# Patient Record
Sex: Female | Born: 1940
Health system: Southern US, Community
[De-identification: ages and names within clinical notes are randomized; demographics above are authoritative.]

## PROBLEM LIST (undated history)

## (undated) DIAGNOSIS — Z8489 Family history of other specified conditions: Secondary | ICD-10-CM

## (undated) DIAGNOSIS — Z9289 Personal history of other medical treatment: Secondary | ICD-10-CM

## (undated) DIAGNOSIS — F419 Anxiety disorder, unspecified: Secondary | ICD-10-CM

## (undated) DIAGNOSIS — M549 Dorsalgia, unspecified: Secondary | ICD-10-CM

## (undated) DIAGNOSIS — M1990 Unspecified osteoarthritis, unspecified site: Secondary | ICD-10-CM

## (undated) DIAGNOSIS — M2540 Effusion, unspecified joint: Secondary | ICD-10-CM

## (undated) DIAGNOSIS — R600 Localized edema: Secondary | ICD-10-CM

## (undated) DIAGNOSIS — E785 Hyperlipidemia, unspecified: Secondary | ICD-10-CM

## (undated) DIAGNOSIS — I10 Essential (primary) hypertension: Secondary | ICD-10-CM

## (undated) DIAGNOSIS — I2699 Other pulmonary embolism without acute cor pulmonale: Secondary | ICD-10-CM

## (undated) DIAGNOSIS — R0602 Shortness of breath: Secondary | ICD-10-CM

## (undated) DIAGNOSIS — Z860100 Personal history of colon polyps, unspecified: Secondary | ICD-10-CM

## (undated) DIAGNOSIS — E039 Hypothyroidism, unspecified: Secondary | ICD-10-CM

## (undated) DIAGNOSIS — C801 Malignant (primary) neoplasm, unspecified: Secondary | ICD-10-CM

## (undated) DIAGNOSIS — R350 Frequency of micturition: Secondary | ICD-10-CM

## (undated) DIAGNOSIS — R609 Edema, unspecified: Secondary | ICD-10-CM

## (undated) DIAGNOSIS — Z9989 Dependence on other enabling machines and devices: Secondary | ICD-10-CM

## (undated) DIAGNOSIS — R062 Wheezing: Secondary | ICD-10-CM

## (undated) DIAGNOSIS — K559 Vascular disorder of intestine, unspecified: Secondary | ICD-10-CM

## (undated) DIAGNOSIS — G4733 Obstructive sleep apnea (adult) (pediatric): Secondary | ICD-10-CM

## (undated) DIAGNOSIS — G8929 Other chronic pain: Secondary | ICD-10-CM

## (undated) DIAGNOSIS — H269 Unspecified cataract: Secondary | ICD-10-CM

## (undated) DIAGNOSIS — R531 Weakness: Secondary | ICD-10-CM

## (undated) DIAGNOSIS — Z8709 Personal history of other diseases of the respiratory system: Secondary | ICD-10-CM

## (undated) DIAGNOSIS — M2550 Pain in unspecified joint: Secondary | ICD-10-CM

## (undated) DIAGNOSIS — D869 Sarcoidosis, unspecified: Secondary | ICD-10-CM

## (undated) DIAGNOSIS — J189 Pneumonia, unspecified organism: Secondary | ICD-10-CM

## (undated) HISTORY — PX: APPENDECTOMY: SHX54

## (undated) HISTORY — PX: MELANOMA EXCISION: SHX5266

## (undated) HISTORY — PX: BREAST BIOPSY: SHX20

## (undated) HISTORY — PX: BREAST CYST ASPIRATION: SHX578

## (undated) HISTORY — DX: Shortness of breath: R06.02

## (undated) HISTORY — PX: ABDOMINAL HYSTERECTOMY: SHX81

## (undated) HISTORY — DX: Obstructive sleep apnea (adult) (pediatric): G47.33

## (undated) HISTORY — PX: COLONOSCOPY: SHX174

## (undated) HISTORY — PX: LIVER BIOPSY: SHX301

## (undated) HISTORY — DX: Hyperlipidemia, unspecified: E78.5

## (undated) HISTORY — PX: DILATION AND CURETTAGE OF UTERUS: SHX78

## (undated) HISTORY — DX: Dependence on other enabling machines and devices: Z99.89

## (undated) HISTORY — PX: THYROID SURGERY: SHX805

---

## 1970-06-09 HISTORY — PX: OTHER SURGICAL HISTORY: SHX169

## 1990-06-09 DIAGNOSIS — J189 Pneumonia, unspecified organism: Secondary | ICD-10-CM

## 1990-06-09 HISTORY — DX: Pneumonia, unspecified organism: J18.9

## 1998-07-26 ENCOUNTER — Other Ambulatory Visit: Admission: RE | Admit: 1998-07-26 | Discharge: 1998-07-26 | Payer: Self-pay | Admitting: Obstetrics and Gynecology

## 1998-11-30 ENCOUNTER — Ambulatory Visit (HOSPITAL_COMMUNITY): Admission: RE | Admit: 1998-11-30 | Discharge: 1998-11-30 | Payer: Self-pay | Admitting: Surgery

## 1998-11-30 ENCOUNTER — Encounter: Payer: Self-pay | Admitting: Surgery

## 1999-10-29 ENCOUNTER — Encounter: Payer: Self-pay | Admitting: Surgery

## 1999-10-29 ENCOUNTER — Encounter: Admission: RE | Admit: 1999-10-29 | Discharge: 1999-10-29 | Payer: Self-pay

## 1999-11-11 ENCOUNTER — Other Ambulatory Visit: Admission: RE | Admit: 1999-11-11 | Discharge: 1999-11-11 | Payer: Self-pay | Admitting: Obstetrics and Gynecology

## 2001-02-05 ENCOUNTER — Encounter: Admission: RE | Admit: 2001-02-05 | Discharge: 2001-02-05 | Payer: Self-pay | Admitting: Surgery

## 2001-02-05 ENCOUNTER — Encounter: Payer: Self-pay | Admitting: Surgery

## 2002-04-11 ENCOUNTER — Encounter (HOSPITAL_BASED_OUTPATIENT_CLINIC_OR_DEPARTMENT_OTHER): Payer: Self-pay | Admitting: Internal Medicine

## 2002-04-11 ENCOUNTER — Ambulatory Visit (HOSPITAL_COMMUNITY): Admission: RE | Admit: 2002-04-11 | Discharge: 2002-04-11 | Payer: Self-pay | Admitting: Internal Medicine

## 2003-08-22 ENCOUNTER — Emergency Department (HOSPITAL_COMMUNITY): Admission: EM | Admit: 2003-08-22 | Discharge: 2003-08-22 | Payer: Self-pay | Admitting: Emergency Medicine

## 2007-05-24 ENCOUNTER — Encounter (HOSPITAL_BASED_OUTPATIENT_CLINIC_OR_DEPARTMENT_OTHER): Payer: Self-pay | Admitting: Internal Medicine

## 2007-05-24 ENCOUNTER — Ambulatory Visit: Payer: Self-pay | Admitting: *Deleted

## 2007-05-24 ENCOUNTER — Inpatient Hospital Stay (HOSPITAL_COMMUNITY): Admission: EM | Admit: 2007-05-24 | Discharge: 2007-06-04 | Payer: Self-pay | Admitting: Internal Medicine

## 2007-06-02 ENCOUNTER — Encounter (HOSPITAL_BASED_OUTPATIENT_CLINIC_OR_DEPARTMENT_OTHER): Payer: Self-pay | Admitting: Internal Medicine

## 2007-09-02 ENCOUNTER — Observation Stay (HOSPITAL_COMMUNITY): Admission: EM | Admit: 2007-09-02 | Discharge: 2007-09-03 | Payer: Self-pay | Admitting: Emergency Medicine

## 2007-09-03 ENCOUNTER — Encounter (INDEPENDENT_AMBULATORY_CARE_PROVIDER_SITE_OTHER): Payer: Self-pay | Admitting: *Deleted

## 2008-09-07 ENCOUNTER — Inpatient Hospital Stay (HOSPITAL_COMMUNITY): Admission: AD | Admit: 2008-09-07 | Discharge: 2008-09-09 | Payer: Self-pay | Admitting: Internal Medicine

## 2010-05-05 ENCOUNTER — Ambulatory Visit: Payer: Self-pay | Admitting: Diagnostic Radiology

## 2010-05-05 ENCOUNTER — Emergency Department (HOSPITAL_BASED_OUTPATIENT_CLINIC_OR_DEPARTMENT_OTHER): Admission: EM | Admit: 2010-05-05 | Discharge: 2010-05-05 | Payer: Self-pay | Admitting: Emergency Medicine

## 2010-05-29 ENCOUNTER — Ambulatory Visit (HOSPITAL_COMMUNITY)
Admission: RE | Admit: 2010-05-29 | Discharge: 2010-05-29 | Payer: Self-pay | Source: Home / Self Care | Attending: Internal Medicine | Admitting: Internal Medicine

## 2010-06-12 ENCOUNTER — Other Ambulatory Visit
Admission: RE | Admit: 2010-06-12 | Discharge: 2010-06-12 | Payer: Self-pay | Source: Home / Self Care | Admitting: Interventional Radiology

## 2010-06-12 ENCOUNTER — Encounter
Admission: RE | Admit: 2010-06-12 | Discharge: 2010-06-12 | Payer: Self-pay | Source: Home / Self Care | Attending: Internal Medicine | Admitting: Internal Medicine

## 2010-08-21 LAB — DIFFERENTIAL
Basophils Absolute: 0 10*3/uL (ref 0.0–0.1)
Basophils Relative: 0 % (ref 0–1)
Lymphocytes Relative: 26 % (ref 12–46)
Lymphs Abs: 1.8 10*3/uL (ref 0.7–4.0)
Monocytes Absolute: 0.8 10*3/uL (ref 0.1–1.0)
Neutrophils Relative %: 62 % (ref 43–77)

## 2010-08-21 LAB — CBC
HCT: 44.5 % (ref 36.0–46.0)
Hemoglobin: 14.9 g/dL (ref 12.0–15.0)
MCH: 30.5 pg (ref 26.0–34.0)
MCHC: 33.5 g/dL (ref 30.0–36.0)
MCV: 90.9 fL (ref 78.0–100.0)
Platelets: 227 10*3/uL (ref 150–400)
RDW: 12.8 % (ref 11.5–15.5)
WBC: 7.2 10*3/uL (ref 4.0–10.5)

## 2010-08-21 LAB — BASIC METABOLIC PANEL
BUN: 15 mg/dL (ref 6–23)
CO2: 28 mEq/L (ref 19–32)
Sodium: 147 mEq/L — ABNORMAL HIGH (ref 135–145)

## 2010-09-18 LAB — CBC
HCT: 37.8 % (ref 36.0–46.0)
HCT: 39.4 % (ref 36.0–46.0)
HCT: 40 % (ref 36.0–46.0)
HCT: 45.1 % (ref 36.0–46.0)
Hemoglobin: 13.2 g/dL (ref 12.0–15.0)
Hemoglobin: 13.7 g/dL (ref 12.0–15.0)
Hemoglobin: 15.3 g/dL — ABNORMAL HIGH (ref 12.0–15.0)
MCHC: 33.9 g/dL (ref 30.0–36.0)
MCHC: 34.7 g/dL (ref 30.0–36.0)
MCHC: 35.1 g/dL (ref 30.0–36.0)
MCV: 91.3 fL (ref 78.0–100.0)
MCV: 92.5 fL (ref 78.0–100.0)
MCV: 93.6 fL (ref 78.0–100.0)
Platelets: 194 10*3/uL (ref 150–400)
RBC: 4.28 MIL/uL (ref 3.87–5.11)
RBC: 4.46 MIL/uL (ref 3.87–5.11)
RDW: 13.2 % (ref 11.5–15.5)
RDW: 13.3 % (ref 11.5–15.5)
RDW: 13.4 % (ref 11.5–15.5)
RDW: 13.4 % (ref 11.5–15.5)
RDW: 13.4 % (ref 11.5–15.5)
WBC: 11.2 10*3/uL — ABNORMAL HIGH (ref 4.0–10.5)
WBC: 12.7 10*3/uL — ABNORMAL HIGH (ref 4.0–10.5)
WBC: 14.8 10*3/uL — ABNORMAL HIGH (ref 4.0–10.5)
WBC: 9.5 10*3/uL (ref 4.0–10.5)

## 2010-09-18 LAB — COMPREHENSIVE METABOLIC PANEL
ALT: 26 U/L (ref 0–35)
Albumin: 3.7 g/dL (ref 3.5–5.2)
Alkaline Phosphatase: 88 U/L (ref 39–117)
Creatinine, Ser: 0.74 mg/dL (ref 0.4–1.2)
GFR calc Af Amer: >60 mL/min (ref >60)
Glucose, Bld: 92 mg/dL (ref 70–99)
Sodium: 141 mEq/L (ref 135–145)
Total Bilirubin: 0.9 mg/dL (ref 0.3–1.2)
Total Protein: 7 g/dL (ref 6.0–8.3)

## 2010-09-18 LAB — BASIC METABOLIC PANEL
BUN: 9 mg/dL (ref 6–23)
CO2: 26 mEq/L (ref 19–32)
Calcium: 8.7 mg/dL (ref 8.4–10.5)
Calcium: 8.7 mg/dL (ref 8.4–10.5)
Creatinine, Ser: 0.75 mg/dL (ref 0.4–1.2)
Creatinine, Ser: 0.75 mg/dL (ref 0.4–1.2)
Glucose, Bld: 98 mg/dL (ref 70–99)

## 2010-09-18 LAB — HEMOGLOBIN AND HEMATOCRIT, BLOOD: Hemoglobin: 13.2 g/dL (ref 12.0–15.0)

## 2010-10-22 NOTE — H&P (Signed)
NAME:  QUETZALI, HEINLE NO.:  [id number]   MEDICAL RECORD NO.:  [id number]          PATIENT TYPE:  INP   LOCATION:  4705                         FACILITY:  [facility]   PHYSICIAN:  Gwen Pounds, MD       DATE OF BIRTH:  May 04, 1941   DATE OF ADMISSION:  09/07/2008  DATE OF DISCHARGE:                              HISTORY & PHYSICAL   PRIMARY CARE [place]ingle:  Dr. Dossie Arbour.   GASTROENTEROLOGY DOCTOR:  Dr. Everardo All. Madilyn Fireman, MD   CHIEF COMPLAINT:  Bloody bowel movements and bright red blood per  rectum.   HISTORY OF PRESENT ILLNESS:  This is a 70 year old female with history  of 8-mm of colon polyps done and colonoscopy on May 20, 2007 and  diverticulosis, who presents with 12 hours of recurrent and multiple  bloody bowel movements, bright red blood per rectum, abdominal cramps  left greater than right.  She had a history of DVT in the past and is  off Coumadin since July 2009.  She brought very bloody face cloths from  wiping and cleaning into the office today.  She has passed multiple  clots.  No unusual exposures or illness.  No fever.  No chills.  Positive nausea.  No vomiting.  No reflux.  We will admit for evaluation  and treatment.   PAST MEDICAL HISTORY:  Hypertension, hyperlipidemia, hypothyroid,  seasonal allergic rhinitis, thyroid cancer, status post thyroidectomy,  history of appendectomy, history of liver biopsy, history of benign  breast biopsy x2, atypical chest pain with a negative workup in the  past, history of left lower extremity DVT, and pulmonary emboli in  December 2008, and off Coumadin since July 2009.   ALLERGIES:  CODEINE, TENORMIN, ZOCOR, CRESTOR, CO Q10, and CELEXA.   MEDICATIONS:  1. Synthroid 112 daily.  2. Tiazac 360 daily.  3. Avapro 300 one half daily.  4. Toprol-XL one half 50 mg tablet on a daily basis.  5. Lovastatin 20 daily.  6. Ativan 1 mg to use p.r.n.  7. Vitamin D 50,000 every weekly.  8. Multivitamin 1 p.o. daily.  9. Calcium plus D daily.  10.Senior eye care vitamin.  11.Advair 100/50 b.i.d.   SOCIAL HISTORY:  She is married.  No tobacco, no alcohol.   FAMILY HISTORY:  Diabetes or breast cancer.   REVIEW OF SYSTEMS:  No fever no chills.  No night sweats.  No headache.  No chest pain.  No shortness of breath.  The patient is anxious.  She  has got bright red blood per rectum.  She has got abdominal pain.  She  has got no hematemesis.   PHYSICAL EXAMINATION:  VITAL SIGNS:  Temperature 98.5, heart rate 82,  respiratory rate 18, weight 202 pounds, and blood pressure 180/98.  GENERAL:  Alert and oriented.  She is anxious.  PULMONARY:  Clear to auscultation bilaterally.  CARDIAC:  Regular.  HEENT:  Oropharynx is dry.  ABDOMEN:  Tender in bilateral lower quadrant, left greater than right.  No rebound or guarding.  ABDOMEN:  Overall soft.  EXTREMITIES:  No clubbing.  No cyanosis.  No edema.  She has a bloody  face cloth in her bag from soaked up blood from her rectum.   Last hemoglobin 15.7 on November 2009.  I did a stat CBC with a white  count of 13.3, hemoglobin 15.5, and platelet count 247.   ASSESSMENT:  1. This is a lady being admitted with bright red blood per rectum,      lower GI bleed, presumed diverticular.  We will admit.  Place on      telemetry.  Place n.p.o.  Get a bedside commode.  Get a GI      evaluation.  Dr. Madilyn Fireman is normally her primary GI.  We will get      serial CBCs.  Protonix IV.  May get colonoscopy if it does not stop      or surgery; if severe, we will give blood pressure medications.  2. Home medications as needed.  No Lovenox or aspirin.  There is no      evidence of needing a CT or antibiotics yet, we will let GI make      the call.  The only issue is her elevated white count.      Gwen Pounds, MD  Electronically Signed     JMR/MEDQ  D:  09/07/2008  T:  09/08/2008  Job:  [id number]   cc:   Everardo All. Madilyn Fireman, M.D.  Dr. Jarold Motto

## 2010-10-22 NOTE — Consult Note (Signed)
NAME:  [name], [name] NO.:  [id number]   MEDICAL RECORD NO.:  [id number]          PATIENT TYPE:  INP   LOCATION:  1442                         FACILITY:  [facility]   PHYSICIAN:  Valetta Fuller, M.D.  DATE OF BIRTH:  December 08, 1940   DATE OF CONSULTATION:  06/02/2007  DATE OF DISCHARGE:                                 CONSULTATION   REASON FOR CONSULTATION:  Asymptomatic gross hematuria, on  anticoagulation for pulmonary embolus.   HISTORY OF PRESENT ILLNESS:  [name] is a very pleasant 70-year-  old female.  Really has no significant urologic history, other than  occasional sporadic urinary tract infections.  She was readmitted after  she had began to develop some increasing dyspnea on exertion and  shortness of breath.  Apparently during a colonoscopy she was noted to  be hypoxic.  As part of her assessment, she had a chest CT which showed  multiple pulmonary emboli.  There had not been an apparent history of  DVT.  The patient was admitted back on May 24, 2007.  She began  noticing some gross hematuria.  Initially there were a few stringy  clots.  With this she has had no flank or abdominal discomfort.  She has  really had no dysuria.  She has had no other substantial change in her  symptoms and really had asymptomatic gross hematuria.  Her INRs have  been relatively therapeutic and within the range as expected.  Her urine  subsequently has cleared somewhat, and the last void was relatively  clear.  She has no personal or family history of stone disease.  She is  a nonsmoker.   PAST MEDICAL HISTORY:  Notable for some hypertension, hyperlipidemia,  hypothyroidism.   MEDICATIONS:  At the time of admission were:  Synthroid, Cardizem,  Avapro, Toprol XL, lovastatin, vitamin D; as well as some additional  vitamins and supplements.   ALLERGIES:  She has allergies or intolerances to CODEINE, ATENOLOL,  ZOCOR and CRESTOR.   PAST SURGICAL HISTORY:  She has  had an appendectomy in the past, breast  biopsies, thyroidectomy.   SOCIAL HISTORY:  Again, she is a nonsmoker and does not consume alcohol.   FAMILY HISTORY:  Really negative for any urologic problems.   REVIEW OF SYSTEMS:  Negative for any fever or chills.  No abdominal  discomfort or flank pain.  No severe incontinence.  No dysuria.  No  current chest pain.  Shortness of breath has improved.   PHYSICAL EXAMINATION:  On exam she is a well-developed, well-nourished  female.  She is alert and oriented x3.  The patient is currently  afebrile with a blood pressure of 128/72, a pulse of 57, and a room air  saturation of 93%.  Respiratory effort is normal.  ABDOMEN:  Protuberant but soft.  No obvious palpable masses or  splenomegaly.  GU:  Vaginal exam shows a mild cystocele.  Mild vaginal atrophy.  No  obvious palpable urethral masses or any other vaginal masses.  No  evidence of vaginal bleeding.  EXTREMITIES:  Do not show any substantial tenderness or edema.   DATA:  Urinalysis was notable for large blood on dipstick.  Negative  nitrate, negative leukocyte.  Microscopic actually showed only 3-6 red  cells per high-power field.  Culture remains pending and urine cytology  was also sent, which remains pending.  Renal function studies are normal  and hemoglobin is 15.8.   PROCEDURE:  Bedside flexible cystoscopy was performed.  Urethra and  bladder were endoscopically normal, with the exception of a little bit  of what looked like some chronic inflammatory changes around her  trigone.  There appear to be a little mucosal cobblestoning, with some  submucosal edema and mild erythema.  This was more consistent with a  benign inflammatory process, but could not completely rule out the  possibility of carcinoma in situ, although I think that is unlikely.  No  obvious bladder masses.  Efflux of urine from both sides appeared to be  clear.   ASSESSMENT:  GROSS HEMATURIA ON ANTICOAGULATION.   No evidence of  bacterial cystitis.  Cystoscopy relatively unrevealing with cytology  pending.  I do think she should have an abdominal pelvic CT.  I would  propose the abdominal component be done with and without contrast to a  look for renal calculi.  We want to rule out renal mass or any other  more significant pathology.  If nothing else is noted, then I think she  should continue with her anticoagulation -- knowing that it is possible  that she could have some recurrent hematuria, and we will address that  as indicated.  We will also arrange for some urologic follow-up to be  sure that all things have checked out.           ______________________________  Valetta Fuller, M.D.  Electronically Signed     DSG/MEDQ  D:  06/02/2007  T:  06/03/2007  Job:  [id number]

## 2010-10-22 NOTE — Consult Note (Signed)
NAME:  [name], [name] NO.:  [id number]   MEDICAL RECORD NO.:  [id number]          PATIENT TYPE:  INP   LOCATION:  4706                         FACILITY:  [facility]   PHYSICIAN:  Danise Edge, M.D.   DATE OF BIRTH:  07-Dec-1940   DATE OF CONSULTATION:  09/07/2008  DATE OF DISCHARGE:                                 CONSULTATION   We were asked to see her in consultation today for GI bleeding by Dr.  Creola Corn.   HISTORY OF PRESENT ILLNESS:  This is a very pleasant 70 year old female  who tells me that she was awakened from sleep last night approximately  1:00 a.m.  She had multiple stools that eventually changed to bright red  blood with clots.  The patient has a continual urge to stools today.  Her last bright red blood per rectum was at 11:30 a.m. this morning.  She has severe lower abdominal cramping that continues even now.   Prior to this illness, the patient reports that she had similar episodes  of awakening at night with cramps and stooling but not had any blood  previously.  This would happen to her once every few months.  She denies  any indigestion, heartburn, anorexia, weight loss, or emesis.  The  patient has been on steroids intermittently for chronic lung issues that  sound like bronchitis since she had her pulmonary emboli in 2008.  Her  primary care physician is Dr. Jarold Motto and Dr. Creola Corn.  Her last  colonoscopy was in December 2008, it was done by Dr. Dorena Cookey who  found one 8-mm polyp in her descending colon and also found few sigmoid  diverticula.   PAST MEDICAL HISTORY:  Significant for:  1. PEs in 2008.  2. Hypertension.  3. High cholesterol.  4. Sarcoid in her lungs.  5. IBS.  6. Paroxysmal atrial fibrillation.  7. Open appendectomy when she was 70 years old.  8. Thyroid cancer, she is status post resection.  9. Two benign breast biopsies.  10.She has no known coronary artery disease.   She has an allergy to [facility].   CURRENT  MEDICATIONS:  Avapro, Synthroid, metoprolol, pravastatin,  vitamin D, multivitamins, and calcium.   SOCIAL HISTORY:  Negative for alcohol and tobacco.   FAMILY HISTORY:  Significant for her father who died with colon cancer.  He was 72 years old when he passed.   REVIEW OF SYSTEMS:  Significant for severe cough back in January,  intermittent abdominal cramping, occasional palpitations, and occasional  steroid use.   PHYSICAL EXAMINATION:  GENERAL:  She is alert and oriented, very  pleasant to speak with.  She is in no apparent distress.  HEART:  Regular rate and rhythm with no murmurs, rubs, or gallops  appreciated.  LUNGS:  Demonstrate no wheezing, no crackles.  She has no labored  breathing.  ABDOMEN:  Soft, nontender, and nondistended with good bowel sounds.  However, she reports her pain is in her lower quadrants bilaterally.   LABORATORY DATA:  Pending.  Her husband tells me that her hemoglobin was  normal in Dr. Ferd Hibbs office today.  ASSESSMENT:  Dr. Danise Edge has seen and examined the patient,  collected a history, and reviewed her chart.  His impression is this is  a very pleasant 70 year old female with ischemic colitis versus  diverticular bleed.  We will plan for supportive care, transfuse packed  red blood cells as needed.  We will gently cleanse her colon with  MiraLax and follow her clinically.  Thanks very much for this  consultation.      Stephani Police, PA    ______________________________  Danise Edge, M.D.    MLY/MEDQ  D:  09/07/2008  T:  09/08/2008  Job:  [id number]   cc:   Everardo All. Madilyn Fireman, M.D.  Gwen Pounds, MD

## 2010-10-22 NOTE — Discharge Summary (Signed)
NAME:  [name], [name] NO.:  [id number]   MEDICAL RECORD NO.:  [id number]          PATIENT TYPE:  INP   LOCATION:  2006                         FACILITY:  [facility]   PHYSICIAN:  Barry Dienes. [name], M.D.DATE OF BIRTH:  06-Oct-1940   DATE OF ADMISSION:  09/02/2007  DATE OF DISCHARGE:  09/03/2007                               DISCHARGE SUMMARY   PERTINENT FINDINGS:  The patient is a 70 year old Caucasian woman who  complained of chest tightness described as first occurring on August 21, 2007.  It was mid substernal in location and aching.  At that time, her  chest discomfort lasted most of the day and was not associated with  shortness of breath, nausea, diaphoresis, or radiation to the left  shoulder.  On the next day, she had a few twinges of chest discomfort.  Since that time, she has had an occasional less than 5-minute episodes  of similar chest discomfort, again not associated with shortness of  breath or diaphoresis.  She has not had any exertional chest pain.  She  has no history of coronary artery disease.   PAST MEDICAL HISTORY:  Significant for a December 2008 hospital  admission for shortness of breath with left leg discomfort.  The work up  showing massive pulmonary emboli.  Since that time, she has been on  Coumadin.  Of note, on August 20, 2007, her INR level was 1.8 and a dose  of Coumadin was adjusted to 6 mg daily with 8 mg on Fridays.  She has  had increasing anxiety lately and had just seen her counselor earlier on  the day of admission.  She has no history of coronary artery disease and  had an October 2003 Cardiolite exercise test for atypical chest pain  that showed normal left ventricular ejection fraction (68%) with no  signs of ischemia.  She also has a history of hypertension,  hyperlipidemia, hypothyroidism, and thyroid carcinoma with December 2008  thyroglobulin level normal, and right upper quadrant pain in November  2003 with CT scan of the  abdomen and pelvis not showing gallstones or  hepatic neoplasm, and 2003 HIDA scan normal.   MEDICATIONS PRIOR TO HOSPITAL ADMISSION:  1. Coumadin 8 mg on Fridays and 6 mg other days of the week.  2. Ativan 0.5 mg twice daily.  3. Synthroid 112 mcg daily.  4. Cardizem CD 360 daily.  5. Avapro 150 mg daily.  6. Toprol-XL 25 mg twice daily.  7. Zetia 10 mg daily.  8. Lovastatin 80 mg daily.  9. Vitamin D 50,000 units p.o. once weekly.  10.Multivitamin once daily.  11.Calcium 600 mg with vitamin D twice daily.  12.Fish oil capsules once daily.   INITIAL PHYSICAL EXAM:  VITAL SIGNS:  Blood pressure 160/92, pulse 80,  respirations 20, pulse oxygen saturation 97% on room air.  GENERAL:  She is a mildly overweight white female who is in no apparent  distress.  HEENT:  Within normal limits.  NECK:  Supple without jugular venous distension or carotid bruit.  CHEST:  Clear to auscultation.  HEART:  Regular rate and rhythm without significant murmur,  gallop, or  rub.  ABDOMEN:  Benign.  EXTREMITIES:  Without cyanosis, clubbing, or edema.  NEUROLOGICAL:  Nonfocal.   LABORATORY STUDIES:  EKG showed the following:  Normal sinus rhythm and  within normal limits with normalization of a flat T-wave in lead aVF  versus an EKG from May 24, 2007.  The office INR level was 2.0.   HOSPITAL COURSE:  The patient was admitted to a medical bed with  telemetry.  She was seen by a Cardiology Consultant who felt that she  has atypical chest pain that might not be from cardiac disease.  He  recommended drawing serial cardiac isoenzymes to rule out the unlikely  possibility of non-Q-wave myocardial infarction and obtaining an  echocardiogram.  Serial cardiac isoenzymes were normal with troponin I  levels 0.01 and 0.01 and CPK levels of 124 and 115.  The echocardiogram showed the following:  1. Normal left ventricular systolic function and regional wall motion.  2. Mild aortic valve sclerosis without  stenosis.  3. Mild right ventricular enlargement.   The patient did find, on the day following her admission, she was a bit  lightheaded in the morning, but was able to walk to the bathroom without  difficulty.  She had no further recurrences of chest pain.  EKG  telemetry showed that she remained in a sinus rhythm.   CONDITION ON DISCHARGE:  She has not had further chest pain and feels  fine right now.  Most recent vital signs include blood pressure 129/74,  pulse 58, respirations 20, temperature 97.2, and pulse oxygen saturation  97% on room air.  In general, she is a mildly overweight white female  who is in no apparent distress.  Chest was clear to auscultation.  Heart  had a regular rate and rhythm and was without significant murmur or  gallop.  Abdomen was benign.  Extremities were without cyanosis,  clubbing, or edema.   MOST RECENT LABORATORY STUDIES:  Serum sodium 141, potassium 3.6,  chloride 106, carbon dioxide 27, BUN 20, creatinine 0.76, glucose 106.  White blood cells 9.3, hemoglobin is 15, hematocrit 45, platelets 233.  INR 1.7.   DISCHARGE DIAGNOSES:  1. Chest pain, precordial, and atypical with no evidence of myocardial      infarction.  2. Hypertension, essential, controlled.  3. Hyperlipidemia.  4. Vitamin D deficiency.  5. Anxiety.  6. Depression.  7. Anticoagulation.  8. December 2008, pulmonary embolism.  9. Hypothyroidism.   DISCHARGE MEDICATIONS:  1. Cardizem CD 360 mg p.o. daily.  2. Toprol-XL 25 mg twice daily.  3. Avapro 300 mg daily.  4. Zetia 10 mg daily.  5. Lovastatin 80 mg daily.  6. Vitamin D 50,000 units p.o. once weekly.  7. Multivitamin once daily.  8. Ativan 0.5 mg p.o. t.i.d. #90 refill 0.  9. Celexa 10 mg one tab p.o. daily #30 refill 6.  10.Coumadin 8 mg on Fridays and 6 mg other days of the week.  11.Synthroid 112 mcg p.o. daily.   DISPOSITION AND FOLLOWUP:  The patient will be called by Dr. Silva Bandy  nurse next week with  regards to scheduling a Cardiolite exercise test at  their office.  In addition, she should have a followup evaluation with  Dr. Jarome Matin at [facility] by calling [phone]  to schedule an appointment within the next two weeks.           ______________________________  Barry Dienes [name], M.D.     DGP/MEDQ  D:  09/03/2007  T:  09/04/2007  Job:  [id number]   cc:   Peter M. [place], M.D.  [facility]

## 2010-10-22 NOTE — Consult Note (Signed)
NAME:  [name], [name] NO.:  [id number]   MEDICAL RECORD NO.:  [id number]          PATIENT TYPE:  INP   LOCATION:  1829                         FACILITY:  [facility]   PHYSICIAN:  Elmore Guise., M.D.DATE OF BIRTH:  Jul 30, 1940   DATE OF CONSULTATION:  09/02/2007  DATE OF DISCHARGE:                                 CONSULTATION   INDICATION FOR CONSULT:  Chest pain.   REFERRING PHYSICIAN:  Dr. Jarome Matin, [facility].   HISTORY OF PRESENT ILLNESS:  [name] is a very pleasant 70-year-  old white female, past medical history of hypertension, thyroid cancer  (status post thyroidectomy approximately 30 years ago), dyslipidemia,  anxiety, and recent pulmonary embolism (December 2008), who presents  with off-and-on chest pain for the last week.  Her symptoms started on  Saturday, when she had substernal and left-sided chest pain all day.  She initially thought it was musculoskeletal.  She took some Tylenol  with some improvement.  She denied any shortness of breath or exertional  worsening.  Her symptoms improved on Sunday, but she felt very fatigued  all day.  On Monday and Tuesday, she had occasional palpitations with  some left-sided chest twinges.  Again,  she denied any exertional  component, no shortness of breath.  No nausea, vomiting, diaphoresis  noted.  She had no orthopnea or PND.  She denies any recent cough or  fever.   REVIEW OF SYSTEMS:  As per HPI.  All others are negative.   CURRENT MEDICATIONS:  Synthroid 112 mcg daily, Tiazac 360 mg daily,  Avapro 150 mg daily, Lopressor 50 mg twice daily, lovastatin 80 mg  daily, Ativan 0.5 mg 3  times daily, Coumadin, Celexa 10 mg daily,  aspirin once daily and Restoril nightly.   ALLERGIES:  ZOCOR AND CRESTOR, CAUSING MYALGIAS.   FAMILY HISTORY:  Positive for palpitations and valvular heart disease  with her mother.   SOCIAL HISTORY:  She is married.  No tobacco or alcohol  use.   PHYSICAL EXAMINATION:  VITAL SIGNS:  She is afebrile.  Blood pressure  140/70, heart rate 60 to 70, showing sinus on telemetry monitor.  Satting 95% on room air.  GENERAL:  She is a very pleasant elderly white female, alert and  oriented x4.  In no acute distress.  HEENT:  Normal.  NECK:  Supple.  No lymphadenopathy.  There are 2+ carotids.  No  JVD and  no bruits.  LUNGS:  Clear with good breath sounds in the bases.  HEART:  Regular with normal S1, S2.  No rub noted.  ABDOMEN:  Soft, nontender, nondistended.  No rebound or guarding.  EXTREMITIES:  Warm with 2+ pulses and no significant edema.   Her EKG done in the office showed normal sinus rhythm, 71 per minute,  with nonspecific ST/T wave changes.  EKG #2 done here in the emergency  room showed normal sinus rhythm, 70 per minute with no ST/T wave changes  noted.  Her chest x-ray showed no acute  cardiopulmonary disease.  Her  blood work showed a white blood cell count of 9.3, hemoglobin of  15.6,  platelet count of 239.  Her INR was 1.8.  Her cardiac markers are  pending at time of dictation.   IMPRESSION:  1. Atypical chest pain.  2. History of pulmonary embolism (diagnosed December 2008, on Coumadin      since that time).  3. Hypertension.  4. Dyslipidemia.   PLAN:  At this time, I would recommend continued serial cardiac enzymes.  She is on good medical therapy.  She is mildly subtherapeutic on her  INR.  I would recommend an echocardiogram to evaluate both RV and LV  function.  If her echo is okay and enzymes are negative then likely okay  for outpatient stress.  I did discuss this with her at length.  I will  follow her up in the morning.  If further cardiac concerns arise, please  feel free to call at any time.      Elmore Guise., M.D.  Electronically Signed     TWK/MEDQ  D:  09/02/2007  T:  09/02/2007  Job:  [id number]

## 2010-10-22 NOTE — Discharge Summary (Signed)
NAME:  [name], LANTIS NO.:  [id number]   MEDICAL RECORD NO.:  [id number]          PATIENT TYPE:  INP   LOCATION:  1442                         FACILITY:  [facility]   PHYSICIAN:  Barry Dienes. [name], M.D.DATE OF BIRTH:  1941/06/08   DATE OF ADMISSION:  05/24/2007  DATE OF DISCHARGE:  06/04/2007                               DISCHARGE SUMMARY   PERTINENT FINDINGS:  The patient is a 70 year old white female who is  generally in very good health and who had been noted to have hypoxia  during a recent routine colonoscopy exam.  For the past 12 days, she has  had a dry cough with increasing dyspnea on exertion, now at [place]  Heart Association Class III level.  She had not had any recent fever or  chills.  She did have a small amount of hemoptysis but denied pleuritic  chest pain.  She has not had recent long trips or injury to her legs,  and has no personal history of DVT or family history of DVT or pulmonary  embolism.  She was concerned that her difficulty breathing could be due  to [employer] birds that she has had in her home for the past 8 years.  She  denies exertional chest pain.   REVIEW OF SYSTEMS:  Review of systems was most notable for intermittent  left calf pain over the past few weeks.   PAST MEDICAL HISTORY:  1. Hypertension.  2. Hyperlipidemia.  3. Hypothyroidism.  4. Seasonal allergic rhinitis.  5. Remote history of thyroid carcinoma treated with thyroidectomy in      1970, without recurrence.  6. Atypical chest pain leading to 03/2002 Cardiolite exercise test      showing no ischemia with left ventricular ejection fraction of 68%.  7. Vague right upper quadrant abdominal pain in 04/2002 with a      subsequent HIDA scan normal.   MEDICATIONS PRIOR TO ADMISSION:  1. Synthroid 112 mcg daily.  2. Cardizem CD 360 mg daily.  3. Avapro 150 mg daily.  4. Toprol-XL 50 mg take one-half tablet twice daily.  5. Lovastatin 80 mg daily.  6. Vitamin D 50,000  units once weekly.  7. Multivitamin once daily.  8. Caltrate D 600 twice daily.  9. Fish oil once daily.   INITIAL PHYSICAL EXAM:  VITAL SIGNS:  Weight 216 pounds.  Blood pressure  162/90, pulse 82, respirations 20, pulse oxygen saturation 90% on room  air, temperature 98.4.  GENERAL:  She is a mildly overweight white female who was in no apparent  distress while sitting still.  HEENT:  Exam was within normal limits.  NECK:  Neck was supple, without jugular venous tension or carotid bruit.  CHEST:  Chest was clear to auscultation.  HEART:  Heart had a regular rate and rhythm and was without significant  murmur or gallop.  ABDOMEN:  Abdomen had normal bowel sounds and no hepatosplenomegaly or  tenderness.  EXTREMITIES:  Extremities had bilateral 1+ leg edema and normal Homans  testing bilaterally.  There were no palpable venous cords.   LABORATORY AND IMAGING STUDIES:  1. EKG showed the  following:      a.     Normal sinus rhythm      b.     Resolution of septal Q waves.      c.     Nonspecific T wave abnormality.  2. A chest x-ray showed no acute cardiopulmonary disease.  3. A CT scan of the chest on the day of admission done at an outside      facility showed the following:      a.     Massive pulmonary emboli predominantly to the bilateral       lower lobe pulmonary arteries.      b.     Scattered lung parenchymal changes, likely pneumonitis       and/or areas of scar or atelectasis.      c.     Small lateral left mid lung nodule with followup CT in 6       months recommended for surveillance.      d.     Calcific granulomatous changes of the thorax and upper       abdomen.  4. A BNP result was 665, serum sodium 139, potassium 3.5, chloride      104, carbon dioxide 22, BUN 12, creatinine 0.9, glucose 111, total      protein 7.1, albumin 3.8, AST 25, ALT 39, alkaline phosphatase 94,      total bilirubin 0.9.  White blood cells 10.3, hemoglobin 13.6,      hematocrit 40.5,  platelets 243.  TSH 3.4.   HOSPITAL COURSE:  The patient was admitted to a medical bed without  telemetry.  She was started on IV heparin per pharmacist protocol.  Gradually, her breathing returned to baseline.  On 05/24/2007, she had a  bilateral DVT lower extremity ultrasound exam that showed partially  occlusive DVT in the left popliteal vein.  She had a delayed therapeutic  response to Coumadin.  When she was adequately anticoagulated with  Coumadin, she had an episode of gross hematuria.  A urinalysis after the  gross hematuria showed 3 to 6 red blood cells.  A Urology consultant did  a bedside flexible cystoscopy that was notable solely for mild chronic  inflammatory changes around her trigone and slight mucosal cobblestoning  with some submucosal edema and erythema.  This was suggestive of a  benign inflammatory process.  There were no obvious bladder masses seen  and efflux of urine from both sides appeared to be clear.  He  recommended a CT scan of the abdomen and pelvis without and with IV  contrast.  This study showed evidence of old granulomatous disease in  the lungs, normal kidneys and ureters, and normal pelvis CT scan,  indicating that she is status post hysterectomy, and the ovaries had a  normal appearance.  There was also sigmoid diverticulosis.  She had no  further episodes of significant gross hematuria.   PROCEDURES:  1. Bilateral deep venous thrombosis ultrasound exam.  2. Flexible cystoscopy.  3. Computed tomography scan of the abdomen and pelvis without and with      intravenous contrast.   COMPLICATIONS:  None.   CONDITION ON DISCHARGE:  She has no shortness of breath.  She did notice  a slight sediment in her urine but no gross hematuria.  Most recent  vital signs include blood pressure 130/73, pulse 57, respirations 17,  temp 97.6, pulse oxygen saturation 94% on room air.  She was in no  apparent distress.  Her chest  was clear to auscultation.  Her heart  had  a regular rate and rhythm.  Her abdomen was benign.  She had bilateral  trace ankle edema.  Most recent laboratory tests include white blood  cell count 7.3, hemoglobin 14, hematocrit 41, platelets 256, PT 27.7,  INR 2.5.   DISCHARGE DIAGNOSES:  1. Acute pulmonary embolism.  2. Gross hematuria, resolved  3. Left lower extremity deep venous thrombosis.  4. Hypertension.  5. Hyperlipidemia.  6. Hypothyroidism.  7. Seasonal allergic rhinitis.  8. Remote history of thyroid carcinoma treated in 1970 with      thyroidectomy, with followup serum thyroglobulin testing normal      during this admission.  9. Atypical chest pain with 03/2002 Cardiolite exercise test normal.  10.Vitamin D deficiency.   DISCHARGE MEDICATIONS:  1. Synthroid 112 mcg p.o. daily.  2. Cardizem CD 360 mg p.o. daily.  3. Toprol-XL 25 mg twice daily.  4. Calcium and vitamin D tablets twice daily.  5. Lovastatin 80 mg daily.  6. Avapro 300 mg daily.  7. Tessalon 100 mg 1 tablet p.o. t.i.d. p.r.n. cough.  8. Tylenol 500 mg 1 tablet p.o. t.i.d. p.r.n. pain.  9. Ambien 5 mg 1 tablet p.o. nightly p.r.n. sleep, #30.  10.Coumadin 4 mg tablets 2 tablets p.o. daily.  11.Vitamin D 50,000 units 1 cap p.o. once weekly.   DISPOSITION AND FOLLOWUP:  She should have a followup visit in our  office on Tuesday, 06/08/2007, and should call for an appointment to my  nurse, D. J.  She should also have a followup appointment with Dr. Barron Alvine at [facility] in approximately 1 month following  discharge.  If she has recurrence significant gross hematuria, she  should call our on-call physician as soon as possible.           ______________________________  Barry Dienes. [name], M.D.     DGP/MEDQ  D:  06/04/2007  T:  06/04/2007  Job:  [id number]   cc:   Valetta Fuller, M.D.  Fax: [phone]

## 2010-10-22 NOTE — H&P (Signed)
NAME:  Linda, Mclean NO.:  [id number]   MEDICAL RECORD NO.:  [id number]          PATIENT TYPE:  INP   LOCATION:  2006                         FACILITY:  [facility]   PHYSICIAN:  Barry Dienes. [name], M.D.DATE OF BIRTH:  [birth year]   DATE OF ADMISSION:  09/02/2007  DATE OF DISCHARGE:                              HISTORY & PHYSICAL   CHIEF COMPLAINT:  Chest tightness.   HISTORY OF PRESENT ILLNESS:  The patient is a 70 year old white female  with a complaint of chest tightness described as first occurring on  August 21, 2007, and mid substernal in location and aching.  At that time  her chest discomfort lasted most of the day and was not associated with  shortness of breath, nausea, diaphoresis or radiation to the left  shoulder.  On the next day she had a few twinges of chest discomfort.  She has not had exertional chest pain.  She has had occasional less than  5 minute episodes of similar chest discomfort again not associated with  shortness of breath or diaphoresis.   PAST MEDICAL HISTORY:  Most significant for a December 2008 hospital  admission for shortness of breath with left leg discomfort with workup  showing massive pulmonary emboli.  Since that time she has been on  Coumadin.  Of note on March 13 her INR level was 1.8, and her dose of  Coumadin was adjusted to 6 mg daily with 8 mg on Fridays.  She has had  increasing anxiety lately and had just seen her counselor earlier today.  She has no history of coronary artery disease and had an October 2003  Cardiolite exercise test for atypical chest pain that showed left  ventricular ejection fraction of 68% with no signs of ischemia.   PAST MEDICAL HISTORY:  Otherwise, significant for hypertension,  hyperlipidemia, hypothyroidism, thyroid carcinoma with December 2008  thyroglobulin level normal and right upper quadrant pain with November  2003 HIDA scan negative and December 2008 CT scan of the abdomen and  pelvis  not showing gallstones or hepatic neoplasm.   MEDICATIONS PRIOR TO ADMISSION:  1. Coumadin 8 mg on Friday and 6 mg other days of the week.  2. Ativan 0.5 mg p.o. b.i.d.  3. Synthroid 112 mcg p.o. daily.  4. Cardizem CD 360 mg daily.  5. Avapro 150 mg daily.  6. Toprol XL 25 mg twice daily.  7. Zetia 10 mg daily.  8. Lovastatin 80 mg daily.  9. Vitamin D 50,000 units p.o. once weekly.  10.Multivitamin one tab daily.  11.Calcium plus vitamin D 600 mg twice daily.  12.Fish oil capsules once daily.   ALLERGIES/MEDICATION INTOLERANCES:  CODEINE has been associated with  nausea.  TENORMIN has been associated with depression.  ZOCOR and  CRESTOR have been associated with myalgias.  COENZYME Q10 has been  associated with itching, and she did not feel right on CYMBALTA 30 mg  daily.   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  She has no history of  tobacco or alcohol abuse.  FAMILY HISTORY:  Her father is in his 56's and her mother died at age 74  of complications of diabetes mellitus.  A sister has had breast cancer.  She had one child that died after one month in November 03, 1963from  hydrocephalus, and she has one son who is alive and well.   FAMILY HISTORY:  Notable for close relatives with diabetes mellitus and  breast cancer, but none with premature coronary artery disease or colon  cancer.   REVIEW OF SYSTEMS:  She has not had recent fever or chills.  She has  lost about 4 pounds since June 08, 2007.  She denies constipation,  diarrhea, rectal bleeding, headaches or depression.   INITIAL PHYSICAL EXAMINATION:  VITAL SIGNS:  Blood pressure 160/92,  pulse 80, respirations 20, pulse oxygen saturation 97% on room air,  weight 199 pounds.  GENERAL:  She is a mildly overweight white female who is in no apparent  distress.  HEENT:  Was within normal limits.  NECK:  Supple without jugular venous  distention or carotid bruit.  CHEST:  Clear to auscultation.  HEART:  Regular rate and rhythm without murmur, gallop, or rub.  ABDOMEN:  Had normal bowel sounds, no hepatosplenomegaly or tenderness.  EXTREMITIES:  Without cyanosis, clubbing or edema.  NEUROLOGICAL:  Nonfocal.   An EKG showed the following:  Normal sinus rhythm within normal limits  and normalization of flat T-wave in lead aVF versus EKG from May 24, 2007.  An INR level was 2.   ADMISSION LABORATORY STUDIES:  Admission laboratory studies including  CBC, CMET, troponin I, chest x-ray were pending at the time of  dictation.   IMPRESSION AND PLAN:  1. Chest pain.  She had substernal chest discomfort with some atypical      features for coronary artery disease.  This could be from a      gastrointestinal source such as gastritis or gastroesophageal      reflux.  Since she has had no gallstones on December 2008 CT scan      of the abdomen and pelvis, a hepatobiliary etiology seems unlikely.      The interval EKG change could be due to coronary artery disease.  I      plan on admitting her to the hospital and adding higher dose beta      blocker to her regimen.  If she develops recurrent chest pain we      can use intravenous nitrates.  A cardiologist has been asked to      evaluate her case this evening.  2. Hypertension.  Her blood pressure systolic was a bit too high so      metoprolol will be increased to 50 mg twice daily.  3. Anxiety.  This is moderately severe per discussion with her      counselor.  I plan to increase her Ativan to 0.5 mg t.i.d.      scheduled.  She will also have Restoril available for sleep.           ______________________________  Barry Dienes. [name], M.D.    DGP/MEDQ  D:  09/02/2007  T:  09/03/2007  Job:  [id number]   cc:   Peter M. [place], M.D.

## 2010-10-22 NOTE — Discharge Summary (Signed)
NAME:  [name], [name] NO.:  [id number]   MEDICAL RECORD NO.:  [id number]          PATIENT TYPE:  INP   LOCATION:  4706                         FACILITY:  [facility]   PHYSICIAN:  Gaspar Garbe, M.D.DATE OF BIRTH:  1941/04/30   DATE OF ADMISSION:  09/07/2008  DATE OF DISCHARGE:  09/09/2008                               DISCHARGE SUMMARY   FINAL DIAGNOSES:  1. Probable ischemic colitis, gastrointestinal blood loss.  2. Hypothyroidism.  3. Hypertension.  4. Hypercholesterolemia.  5. Anxiety.  6. Osteopenia.  7. Chronic obstructive pulmonary disease with asthma component.   MEDICATIONS ON DISCHARGE:  1. Synthroid 112 mcg daily.  2. Tiazac 360 mg once daily, currently held.  3. Avapro 150 mg once daily.  4. Toprol-XL 25 mg once daily.  5. Lovastatin 20 mg, currently down to 10 secondary to intolerance.  6. Vitamin D 50,000 units weekly.  7. Multivitamin 1 tablet once daily.  8. Calcium plus vitamin D supplement daily.  9. Advair 100/50 mg twice daily.  10.Prilosec 20 mg p.o. daily p.r.n.   LABORATORY RESULTS:  BMP on date of discharge shows a sodium of 142,  potassium 3.9, and BUN and creatinine 9 and 0.7 respectively.  CBC shows  a white count of 9.5, hemoglobin 13.2 which is stable x36 hours,  hematocrit 41.6, and platelet count 183.   PHYSICAL EXAMINATION:  On date of discharge,  VITAL SIGNS:  Blood pressure 122/62, pulse 64, respiratory rate 16,  temperature 97.9, and saturating 97% on room air.  GENERAL:  No acute distress.  HEENT:  Normocephalic and atraumatic.  PERRLA.  EOMI.  ENT within normal  limits.  NECK:  Supple.  No lymphadenopathy, JVD, or bruit.  LUNGS:  Clear to auscultation bilaterally.  HEART:  Regular rate and rhythm.  No murmur, rub, or gallop.  ABDOMEN:  Soft and nontender.  Normoactive bowel sounds.  No  hepatosplenomegaly.  EXTREMITIES:  No clubbing, cyanosis, or edema.   HOSPITAL COURSE:  The patient was admitted on September 07, 2008, with bowel  movements being bright red per rectum.  She was stabilized with fluids  and observed.  Of note, she had been off Coumadin since July 2009 with a  history of DVT and PE.  She was seen by Dr. Matthias Hughs in consultation who  thought that her pain and the bright red bowel movement that she was  most likely having a small episode of ischemic colitis.  Her blood count  stabilized and the patient retained her appetite.  She was seen on day 3  of her hospitalization by both myself and Dr. Matthias Hughs who agreed with  sending the patient home with close followup.  The patient is to notify  us if she has any further episodes of bright red blood per rectum but  was to understand that over the next couple days, her bowel movements  will be somewhat abnormal.  She can take over-the-counter iron  supplement over the next month as needed and over-the-counter omeprazole  and Prilosec if she has any dyspepsia.  The patient is to contact our  office Monday or Tuesday for  followup with Dr. [name].      Gaspar Garbe, M.D.  Electronically Signed     RWT/MEDQ  D:  09/09/2008  T:  09/09/2008  Job:  [id number]   cc:   Barry Dienes. [name], M.D.  John C. Madilyn Fireman, M.D.

## 2010-10-22 NOTE — H&P (Signed)
NAME:  JERRELL, HART NO.:  [id number]   MEDICAL RECORD NO.:  [id number]          PATIENT TYPE:  INP   LOCATION:  1442                         [facility]ACILITY:  [facility]   PHYSICIAN:  Barry Dienes. [name], M.D.DATE OF BIRTH:  1941-02-04   DATE OF ADMISSION:  05/24/2007  DATE OF DISCHARGE:                              HISTORY & PHYSICAL   CHIEF COMPLAINT:  Difficulty breathing.   HISTORY OF PRESENT ILLNESS:  The patient is a 70 year old white female,  who is generally in very good health, who was noted to have hypoxia  during a recent routine colonoscopy exam.  For the past 12 days, she has  had a dry cough with increasing dyspnea on exertion, now at [place]  Heart Association Class III level.  She has not had recent fever or  chills.  She did have a small amount of hemoptysis but denies pleuritic  chest pain.  She has had no recent long trips and no injury to her legs.  There is also no personal history of DVT and no family history of DVT or  pulmonary embolism.  She is concerned that her difficulty breathing  could be due to [employer] birds that she has had in her home for the past 8  years.  She denies exertional chest pain.   REVIEW OF SYSTEMS:  Most notable for intermittent left calf pain over  the past few weeks.   PAST MEDICAL HISTORY:  1. Hypertension.  2. Hyperlipidemia.  3. Hypothyroidism.  4. Seasonal allergic rhinitis.  5. Remote history of thyroid carcinoma treated with thyroidectomy in      1970 without recurrence.  6. Atypical chest pain leading to an October 2003 Cardiolite exercise      test showing no ischemia with left ventricular ejection fraction of      68%.  7. Vague right upper quadrant abdominal pain in November 2003 with a      subsequent HIDA scan normal.   MEDICATIONS PRIOR TO ADMISSION:  1. Synthroid 112 mcg p.o. daily.  2. Cardizem CD 360 mg once daily.  3. Avapro 150 mg daily.  4. Toprol XL 50 mg, take 1/2 tablet twice daily.  5.  Lovastatin 80 mg daily.  6. Vitamin D 50,000 units p.o. once weekly.  7. Multivitamin once daily.  8. Caltrate D 600 twice daily.  9. Fish oil once daily.   ALLERGIES AND MEDICATION INTOLERANCES:  1. CODEINE has been associated with nausea.  2. ATENOLOL has been associated with depression.  3. ZOCOR and CRESTOR have been associated with myalgias.  4. COENZYME Q10 has been associated with itching.   PAST SURGICAL HISTORY:  1. 1949, appendectomy.  2. 1970, thyroidectomy.  3. 1971, scalene lymph node biopsy that was normal.  4. 1972, liver biopsy that was normal.  5. 1974, total abdominal hysterectomy.  6. July 2000, right breast biopsy which was benign.  7. 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  She has no history of  tobacco or alcohol abuse.   FAMILY HISTORY:  Her mother died at age 46 of complications of diabetes  mellitus.  She has a sister who has had breast cancer.  She had one  child who died at 33 month of age of uncertain cause.  Another son is  alive and well.  There is no family history of premature coronary artery  disease or colon cancer.   REVIEW OF SYSTEMS:  Notable for a weight gain of 5 pounds since April 23, 2007.  She denies change in her vision, exertional chest pain or  palpitations, constipation, rectal bleeding, headaches, anxiety, or  depression.   INITIAL PHYSICAL EXAMINATION:  VITAL SIGNS:  Weight 216 pounds. Blood  pressure 162/90, pulse 82, respirations 20, pulse oxygen saturation 90%  on room air, temperature 98.4.  GENERAL:  She is a mildly overweight white female who is in no apparent  distress while sitting still.  HEAD, EYES, EARS, NOSE AND THROAT:  Exam was within normal limits.  NECK:  Supple without jugular venous distention or carotid bruit.  CHEST:  Clear to auscultation.  HEART:  Regular rate and rhythm and  was without significant murmur or  gallop.  ABDOMEN:  Normal bowel sounds and no hepatosplenomegaly or tenderness.  EXTREMITIES:  Had bilateral 1+ leg edema and normal Homan's testing  bilaterally.  There were no palpable venous cords.   LABORATORY STUDIES:  Office EKG showed the following.  1. Normal sinus rhythm.  2. Resolution of septal Q-waves  3. Nonspecific T-wave abnormality.   A chest x-ray showed no acute cardiopulmonary disease with some fullness  in the right hilum.   A CT scan of the chest on the day of admission showed the following.  1. Massive pulmonary emboli predominantly to the bilateral lower lobe      pulmonary arteries.  2. Scattered lung parenchymal changes, likely pneumonitis and/or areas      of scar or atelectasis.  3. Small lateral left mid lung nodule with followup CT in 6 months      recommended for surveillance  4. Calcific granulomatous changes of thorax and upper abdomen.   A BNP test in the office was 665 (0-100).  Serum sodium 139, potassium  3.5, chloride 104, carbon dioxide 22, BUN 12, creatinine 0.9, glucose  111, total protein 7.1, albumin 3.8, AST 25, ALT 39, alkaline  phosphatase 94, total, bilirubin 0.9. White blood cells 10.3, hemoglobin  13.6, hematocrit 40.5, platelets 243. TSH 3.4   IMPRESSION AND PLAN:  1. Acute pulmonary embolism:  She is moderately symptomatic from her      pulmonary emboli, currently stable on oxygen at 2 liters per      minute.  In our office, she was administered Lovenox 100 mg      subcutaneously.  The cause of her pulmonary emboli is unclear.  She      may have a left lower extremity deep vein thrombosis.  At her age,      an inherited hypercoagulable state is less likely.  It is possible      that she has an occult malignancy.  I plan to check labs to rule      out an inherited hypercoagulable state.  In addition, we will check      a serum thyroglobulin level to help rule out the possibility of       recurrent thyroid carcinoma.  Presumably her last colonoscopy was      normal; however, we will  check the results of that study.  She is      up to date with her mammogram with a last study in February 2008      being normal. We will also check a bilateral DVT ultrasound exam to      see if she has a left lower extremity DVT.  2. Hypertension:  Currently her blood pressure is somewhat elevated.      If her blood pressure remains elevated as her dyspnea improves, we      will adjust her medicines accordingly.  3. Hypothyroidism:  Currently under good control on thyroid      replacement treatment.  4. Hyperlipidemia:  This is under reasonable control on lovastatin at      80 mg daily.  5. Vitamin D deficiency:  Stable on high-dose vitamin D      supplementation.           ______________________________  Barry Dienes [name], M.D.     DGP/MEDQ  D:  05/25/2007  T:  05/25/2007  Job:  [id number]

## 2010-11-20 ENCOUNTER — Other Ambulatory Visit: Payer: Self-pay | Admitting: Surgery

## 2010-11-20 ENCOUNTER — Other Ambulatory Visit (HOSPITAL_BASED_OUTPATIENT_CLINIC_OR_DEPARTMENT_OTHER): Payer: Self-pay | Admitting: Internal Medicine

## 2010-11-20 DIAGNOSIS — Z1231 Encounter for screening mammogram for malignant neoplasm of breast: Secondary | ICD-10-CM

## 2010-11-27 ENCOUNTER — Ambulatory Visit
Admission: RE | Admit: 2010-11-27 | Discharge: 2010-11-27 | Disposition: A | Discharge status: Home / Self Care | Payer: Medicare Other | Source: Ambulatory Visit | Attending: Surgery | Admitting: Surgery

## 2010-11-27 ENCOUNTER — Other Ambulatory Visit (HOSPITAL_BASED_OUTPATIENT_CLINIC_OR_DEPARTMENT_OTHER): Payer: Self-pay | Admitting: Internal Medicine

## 2011-03-03 LAB — DIFFERENTIAL
Basophils Absolute: 0
Basophils Relative: 1
Eosinophils Absolute: 0.1
Eosinophils Relative: 1
Lymphocytes Relative: 28
Lymphs Abs: 2.5
Monocytes Absolute: 1
Monocytes Relative: 11
Neutrophils Relative %: 61

## 2011-03-03 LAB — CBC
Platelets: 233
RDW: 14.2

## 2011-03-03 LAB — COMPREHENSIVE METABOLIC PANEL
Albumin: 3.7
Alkaline Phosphatase: 85
Chloride: 106
GFR calc Af Amer: >60
Glucose, Bld: 106 — ABNORMAL HIGH
Potassium: 3.6
Total Bilirubin: 0.9

## 2011-03-03 LAB — APTT: aPTT: 33

## 2011-03-03 LAB — CK TOTAL AND CKMB (NOT AT ARMC)
CK, MB: 2.3
Relative Index: 1.9
Total CK: 115
Total CK: 124

## 2011-03-03 LAB — TROPONIN I
Troponin I: 0.01
Troponin I: 0.01

## 2011-03-03 LAB — PROTIME-INR
INR: 1.7 — ABNORMAL HIGH
INR: 1.8 — ABNORMAL HIGH
Prothrombin Time: 20.3 — ABNORMAL HIGH
Prothrombin Time: 21.3 — ABNORMAL HIGH

## 2011-03-14 LAB — CBC
HCT: 35.8 — ABNORMAL LOW
HCT: 38.6
HCT: 39.6
Hemoglobin: 12.3
Hemoglobin: 13.3
Hemoglobin: 13.5
Hemoglobin: 13.5
MCHC: 34
MCHC: 34.1
MCHC: 34.1
MCHC: 34.2
MCHC: 34.3
MCHC: 34.3
MCHC: 34.3
MCHC: 34.5
MCHC: 34.6
MCHC: 34.6
MCV: 90
MCV: 90.2
MCV: 90.2
MCV: 90.3
MCV: 90.3
MCV: 90.6
MCV: 90.9
MCV: 91
MCV: 91.1
Platelets: 225
Platelets: 226
Platelets: 256
Platelets: 260
Platelets: 262
Platelets: 262
Platelets: 272
RBC: 3.97
RBC: 4.27
RBC: 4.38
RBC: 4.83
RBC: 5.05
RBC: 5.11
RDW: 13.7
RDW: 13.7
RDW: 13.9
RDW: 13.9
WBC: 10
WBC: 8

## 2011-03-14 LAB — HEPARIN LEVEL (UNFRACTIONATED)
Heparin Unfractionated: 0.39
Heparin Unfractionated: 0.42
Heparin Unfractionated: 0.47
Heparin Unfractionated: 0.52
Heparin Unfractionated: 0.58
Heparin Unfractionated: 0.61
Heparin Unfractionated: 0.63
Heparin Unfractionated: 0.65
Heparin Unfractionated: 0.71 — ABNORMAL HIGH

## 2011-03-14 LAB — URINE CULTURE
Colony Count: NO GROWTH
Special Requests: NEGATIVE

## 2011-03-14 LAB — PROTIME-INR
INR: 1
Prothrombin Time: 13.8
Prothrombin Time: 14.1
Prothrombin Time: 15.4 — ABNORMAL HIGH

## 2011-03-14 LAB — COMPREHENSIVE METABOLIC PANEL
Albumin: 3.4 — ABNORMAL LOW
Alkaline Phosphatase: 93
BUN: 8
CO2: 26
Chloride: 103
Creatinine, Ser: 0.8
GFR calc non Af Amer: >60
Glucose, Bld: 91
Potassium: 3.3 — ABNORMAL LOW
Total Bilirubin: 0.7

## 2011-03-14 LAB — FACTOR 5 LEIDEN

## 2011-03-14 LAB — URINALYSIS, ROUTINE W REFLEX MICROSCOPIC
Ketones, ur: NEGATIVE
Leukocytes, UA: NEGATIVE
Nitrite: NEGATIVE
Protein, ur: NEGATIVE
pH: 7

## 2011-03-14 LAB — PROTEIN S, TOTAL: Protein S Ag, Total: 165 % — ABNORMAL HIGH (ref 70–140)

## 2011-03-14 LAB — APTT: aPTT: 34

## 2011-03-14 LAB — PROTEIN C, TOTAL: Protein C, Total: 93 % (ref 70–140)

## 2011-03-14 LAB — THYROGLOBULIN LEVEL: Antithyroglobulin Ab: 0.8 IU/mL (ref 0.0–14.4)

## 2011-03-16 ENCOUNTER — Emergency Department (EMERGENCY_DEPARTMENT_HOSPITAL): Payer: Medicare Other

## 2011-03-16 ENCOUNTER — Emergency Department (HOSPITAL_BASED_OUTPATIENT_CLINIC_OR_DEPARTMENT_OTHER)
Admission: EM | Admit: 2011-03-16 | Discharge: 2011-03-16 | Disposition: A | Discharge status: Home / Self Care | Payer: Medicare Other | Attending: Emergency Medicine | Admitting: Emergency Medicine

## 2011-03-16 ENCOUNTER — Encounter: Payer: Self-pay | Admitting: Emergency Medicine

## 2011-03-16 ENCOUNTER — Other Ambulatory Visit: Payer: Self-pay

## 2011-03-16 DIAGNOSIS — Z79899 Other long term (current) drug therapy: Secondary | ICD-10-CM | POA: Insufficient documentation

## 2011-03-16 DIAGNOSIS — I10 Essential (primary) hypertension: Secondary | ICD-10-CM

## 2011-03-16 DIAGNOSIS — E079 Disorder of thyroid, unspecified: Secondary | ICD-10-CM | POA: Insufficient documentation

## 2011-03-16 DIAGNOSIS — R42 Dizziness and giddiness: Secondary | ICD-10-CM

## 2011-03-16 DIAGNOSIS — G9389 Other specified disorders of brain: Secondary | ICD-10-CM

## 2011-03-16 HISTORY — DX: Other pulmonary embolism without acute cor pulmonale: I26.99

## 2011-03-16 HISTORY — DX: Malignant (primary) neoplasm, unspecified: C80.1

## 2011-03-16 HISTORY — DX: Essential (primary) hypertension: I10

## 2011-03-16 LAB — COMPREHENSIVE METABOLIC PANEL
ALT: 19 U/L (ref 0–35)
Calcium: 10.1 mg/dL (ref 8.4–10.5)
Creatinine, Ser: 0.8 mg/dL (ref 0.50–1.10)
GFR calc Af Amer: 83 mL/min — ABNORMAL LOW (ref >90)
Glucose, Bld: 94 mg/dL (ref 70–99)
Sodium: 140 mEq/L (ref 135–145)
Total Protein: 8.1 g/dL (ref 6.0–8.3)

## 2011-03-16 LAB — CBC
MCH: 30.1 pg (ref 26.0–34.0)
MCV: 87.7 fL (ref 78.0–100.0)
Platelets: 255 10*3/uL (ref 150–400)
RBC: 5.12 MIL/uL — ABNORMAL HIGH (ref 3.87–5.11)
RDW: 13.6 % (ref 11.5–15.5)

## 2011-03-16 LAB — DIFFERENTIAL
Eosinophils Absolute: 0.1 10*3/uL (ref 0.0–0.7)
Eosinophils Relative: 1 % (ref 0–5)
Lymphs Abs: 3 10*3/uL (ref 0.7–4.0)
Monocytes Absolute: 1.4 10*3/uL — ABNORMAL HIGH (ref 0.1–1.0)

## 2011-03-16 LAB — TROPONIN I: Troponin I: <0.3 ng/mL (ref <0.30)

## 2011-03-16 LAB — CK TOTAL AND CKMB (NOT AT ARMC)
CK, MB: 6 ng/mL — ABNORMAL HIGH (ref 0.3–4.0)
Relative Index: 3.2 — ABNORMAL HIGH (ref 0.0–2.5)
Total CK: 190 U/L — ABNORMAL HIGH (ref 7–177)

## 2011-03-16 MED ORDER — MECLIZINE HCL 25 MG PO TABS
ORAL_TABLET | ORAL | Status: AC
  Administered 2011-03-16: 25 mg via ORAL
  Filled 2011-03-16: qty 1

## 2011-03-16 MED ORDER — MECLIZINE HCL 25 MG PO TABS: 25.0000 mg | ORAL_TABLET | Freq: Three times a day (TID) | ORAL | Status: AC | PRN

## 2011-03-16 MED ORDER — MECLIZINE HCL 25 MG PO TABS
25.0000 mg | ORAL_TABLET | Freq: Once | ORAL | Status: AC
  Administered 2011-03-16: 25 mg via ORAL

## 2011-03-16 NOTE — Discharge Instructions (Signed)
Benign Positional Vertigo (BPV)  Vertigo is a feeling that you are unsteady or that you or your surroundings are moving. Benign positional vertigo (BPV) is the most common form of vertigo. Benign means it does not have a serious cause. It is an upset in the balance system in your middle ear. This is troublesome but usually not serious. A viral infection or head injury are common causes, but often no cause is found. It is more common as we grow older.  SYMPTOMS  Sudden dizziness happens when you move your head in different directions. Some of the problems that come with this are:   Loss of balance    Throwing up      Blurred vision     Dizziness      Feeling sick to your stomach      DIAGNOSIS  Your caregiver may do some specialized testing to prove what is wrong.  HOME CARE INSTRUCTIONS   Rest and eat a well balanced diet.    Move slowly and do not make sudden body or head movements.    Do not drive a car or do any activities that could hurt you or others.    Lie down and rest. Take precautions to prevent falls.   SEEK IMMEDIATE MEDICAL CARE IF:   You develop headaches which are severe or lasting.    You develop continued vomiting.    A temporary loss or change of vision appears.    You notice temporary numbness on one side of your body.    You are temporarily unable to speak.    Temporary areas of weakness develop.    You have weakness or numbness in the face, arms, or legs.    You notice dizziness or difficulty walking.    You experience slurred speech or difficulty swallowing.   MAKE SURE YOU:     Understand these instructions.    Will watch your condition.    Will get help right away if you are not doing well or get worse.   Document Released: 03/03/2006 Document Re-Released: 09/11/2008  [employer] Patient Information 2011 [employer].

## 2011-03-16 NOTE — ED Notes (Signed)
Pt states her BP has steadily climbed since August when her MD changed her BP medication.  Today her BP has been extremely higher than before and she has been having some dizziness with it when moving.  No headache, no weakness, no numbness or tingling.  No diff ambulating.

## 2011-03-16 NOTE — ED Provider Notes (Addendum)
Scribed for Geoffery Lyons, MD, the patient was seen in room MH09/MH09 . This chart was scribed by Ellie Lunch. This patient's care was started at 6:00 PM.    CSN: [id number] Arrival date & time: 03/16/2011  5:39 PM  Chief Complaint  Patient presents with  . Hypertension  . Dizziness    (Consider location/radiation/quality/duration/timing/severity/associated sxs/prior treatment) HPI [name] is a 70 y.o. female who presents to the Emergency Department complaining of dizziness. Pt reports feeling dizzy with movement. Denies spinning sensation. Describes dizziness like balance is off when she moves like she is going to stumble. Dizziness is aggravated by change of position, quick head movements, and walking. Pt also reports feeling hot similar to a hot flash on and off for the past day. Denies HA, weakness, numbness, tingling, CP, SOB, tinnitus, hearing loss, or pressure in ears.  Additionally PT complains of elevated blood pressure. Pt has a history of hypertension, and reports greater variance in her blood pressure since changing blood pressure medicine 3 months ago in July. Change from Aurapro to Irbesartan. Pt says her BP has been steadily climbing in the past week.   Past Medical History  Diagnosis Date  . Hypertension   . PE (pulmonary embolism)   . Thyroid disease     thyroid cancer  . Cancer     thyroid    Past Surgical History  Procedure Date  . Appendectomy   . Thyroid surgery   . Liver biopsy   . Abdominal hysterectomy   . Breast biopsy     History reviewed. No pertinent family history.  History  Substance Use Topics  . Smoking status: Never Smoker   . Smokeless tobacco: Not on file  . Alcohol Use: No   Review of Systems 10 Systems reviewed and are negative for acute change except as noted in the HPI.  Allergies  Codeine  Home Medications   Current Outpatient Rx  Name Route Sig Dispense Refill  . VITAMIN D 2000 UNITS PO TABS Oral Take 4,000  Units by mouth daily.      Marland Kitchen VITAMIN B 12 PO Oral Take 2,500 mg by mouth daily.      Marland Kitchen DILTIAZEM HCL COATED BEADS 360 MG PO CP24 Oral Take 360 mg by mouth daily.      Marland Kitchen FLUTICASONE PROPIONATE 50 MCG/ACT NA SUSP Nasal Place 2 sprays into the nose daily.      . IRBESARTAN 300 MG PO TABS Oral Take 300 mg by mouth at bedtime.      Marland Kitchen LORAZEPAM 1 MG PO TABS Oral Take 0.5 mg by mouth at bedtime.      . MULTI-VITAMIN/MINERALS PO TABS Oral Take 1 tablet by mouth daily.      Marland Kitchen PROBIOTIC ACIDOPHILUS PO CAPS Oral Take 0.5 capsules by mouth daily.        BP 177/88  Pulse 71  Temp(Src) 98 F (36.7 C) (Oral)  Resp 22  Ht 5\' 4"  (1.626 m)  Wt 215 lb (97.523 kg)  BMI 36.90 kg/m2  SpO2 98%  Physical Exam  Nursing note and vitals reviewed. Constitutional: She is oriented to person, place, and time. She appears well-developed and well-nourished.  HENT:  Head: Normocephalic and atraumatic.  Eyes: Conjunctivae and EOM are normal. Pupils are equal, round, and reactive to light.  Neck: Normal range of motion.  Cardiovascular: Normal rate, regular rhythm and normal heart sounds.   Pulmonary/Chest: Effort normal and breath sounds normal.  Abdominal: Soft. There is no tenderness.  Musculoskeletal: Normal range of motion.  Neurological: She is alert and oriented to person, place, and time. She has normal strength. No cranial nerve deficit or sensory deficit.  Skin: Skin is warm and dry.  Psychiatric: She has a normal mood and affect. Judgment normal.   Procedures (including critical care time)  OTHER DATA REVIEWED: Nursing notes, vital signs, and past medical records reviewed.  DIAGNOSTIC STUDIES: Oxygen Saturation is 98% on room air, normal by my interpretation.     Date: 03/16/2011  Rate: 58  Rhythm: normal sinus rhythm  QRS Axis: normal  Intervals: normal  ST/T Wave abnormalities: normal  Conduction Disutrbances:none  Narrative Interpretation:   Old EKG Reviewed: none available   LABS /  RADIOLOGY:  Labs Reviewed  CBC - Abnormal; Notable for the following:    RBC 5.12 (*)    Hemoglobin 15.4 (*)    All other components within normal limits  DIFFERENTIAL - Abnormal; Notable for the following:    Monocytes Relative 14 (*)    Monocytes Absolute 1.4 (*)    All other components within normal limits  CK TOTAL AND CKMB - Abnormal; Notable for the following:    CK, MB 6.0 (*)    All other components within normal limits  TROPONIN I  COMPREHENSIVE METABOLIC PANEL   Ct Head Wo Contrast  03/16/2011  *RADIOLOGY REPORT*  Clinical Data: Hypertension  CT HEAD WITHOUT CONTRAST  Technique:  Contiguous axial images were obtained from the base of the skull through the vertex without contrast.  Comparison: None  Findings: Mild low density within the subcortical and periventricular white matter is identified.  There is prominence of the sulci and ventricles.  The brain has an otherwise normal appearance without evidence for hemorrhage, infarction, hydrocephalus, or mass lesion.  There is no extra axial fluid collection.  The skull and paranasal sinuses are normal.  IMPRESSION:  1.  No acute intracranial abnormalities. 2. Normal brain for age.  Original Report Authenticated By: Rosealee Albee, M.D.    MDM:  All labs look okay.  EKG appears unremarkable.  Her symptoms sound vertiginous to me.   Will give meclizine and follow up as needed if worsens.  DISCHARGE MEDICATIONS: New Prescriptions   No medications on file    SCRIBE ATTESTATION:  I personally performed the services described in this documentation, which was scribed in my presence. The recorded information has been reviewed and considered.         Geoffery Lyons, MD 03/16/11 2011  Geoffery Lyons, MD 03/16/11 2012

## 2011-03-16 NOTE — ED Notes (Signed)
Pt reports increased BP and dizziness x 12 hrs no changed in any Medications

## 2011-10-03 ENCOUNTER — Other Ambulatory Visit: Payer: Self-pay | Admitting: Dermatology

## 2011-11-25 ENCOUNTER — Other Ambulatory Visit: Payer: Self-pay | Admitting: Internal Medicine

## 2011-11-25 DIAGNOSIS — Z1231 Encounter for screening mammogram for malignant neoplasm of breast: Secondary | ICD-10-CM

## 2011-12-18 ENCOUNTER — Ambulatory Visit
Admission: RE | Admit: 2011-12-18 | Discharge: 2011-12-18 | Disposition: A | Discharge status: Home / Self Care | Payer: Medicare Other | Source: Ambulatory Visit | Attending: Internal Medicine | Admitting: Internal Medicine

## 2011-12-18 DIAGNOSIS — Z1231 Encounter for screening mammogram for malignant neoplasm of breast: Secondary | ICD-10-CM

## 2012-03-07 ENCOUNTER — Encounter (HOSPITAL_BASED_OUTPATIENT_CLINIC_OR_DEPARTMENT_OTHER): Payer: Self-pay | Admitting: *Deleted

## 2012-03-07 ENCOUNTER — Emergency Department (HOSPITAL_BASED_OUTPATIENT_CLINIC_OR_DEPARTMENT_OTHER)
Admission: EM | Admit: 2012-03-07 | Discharge: 2012-03-07 | Disposition: A | Discharge status: Home / Self Care | Payer: Medicare Other | Attending: Emergency Medicine | Admitting: Emergency Medicine

## 2012-03-07 DIAGNOSIS — I10 Essential (primary) hypertension: Secondary | ICD-10-CM | POA: Insufficient documentation

## 2012-03-07 DIAGNOSIS — D751 Secondary polycythemia: Secondary | ICD-10-CM

## 2012-03-07 DIAGNOSIS — Z85850 Personal history of malignant neoplasm of thyroid: Secondary | ICD-10-CM | POA: Insufficient documentation

## 2012-03-07 DIAGNOSIS — Z9089 Acquired absence of other organs: Secondary | ICD-10-CM | POA: Insufficient documentation

## 2012-03-07 DIAGNOSIS — I803 Phlebitis and thrombophlebitis of lower extremities, unspecified: Secondary | ICD-10-CM | POA: Insufficient documentation

## 2012-03-07 DIAGNOSIS — M79609 Pain in unspecified limb: Secondary | ICD-10-CM | POA: Insufficient documentation

## 2012-03-07 DIAGNOSIS — Z79899 Other long term (current) drug therapy: Secondary | ICD-10-CM | POA: Insufficient documentation

## 2012-03-07 DIAGNOSIS — M79661 Pain in right lower leg: Secondary | ICD-10-CM

## 2012-03-07 DIAGNOSIS — D45 Polycythemia vera: Secondary | ICD-10-CM | POA: Insufficient documentation

## 2012-03-07 LAB — CBC WITH DIFFERENTIAL/PLATELET
Basophils Relative: 0 % (ref 0–1)
Eosinophils Absolute: 0 10*3/uL (ref 0.0–0.7)
Eosinophils Relative: 0 % (ref 0–5)
MCH: 30.1 pg (ref 26.0–34.0)
MCHC: 33.8 g/dL (ref 30.0–36.0)
MCV: 89.1 fL (ref 78.0–100.0)
Neutrophils Relative %: 65 % (ref 43–77)
Platelets: 255 10*3/uL (ref 150–400)
RBC: 5.32 MIL/uL — ABNORMAL HIGH (ref 3.87–5.11)
RDW: 13.4 % (ref 11.5–15.5)

## 2012-03-07 LAB — BASIC METABOLIC PANEL
Calcium: 9.1 mg/dL (ref 8.4–10.5)
GFR calc Af Amer: 73 mL/min — ABNORMAL LOW (ref >90)
GFR calc non Af Amer: 63 mL/min — ABNORMAL LOW (ref >90)
Potassium: 4 mEq/L (ref 3.5–5.1)
Sodium: 137 mEq/L (ref 135–145)

## 2012-03-07 LAB — PROTIME-INR: INR: 0.94 (ref 0.00–1.49)

## 2012-03-07 MED ORDER — ASPIRIN 81 MG PO CHEW: 81.0000 mg | CHEWABLE_TABLET | Freq: Every day | ORAL | Status: DC

## 2012-03-07 NOTE — ED Provider Notes (Signed)
History     CSN: [id number]  Arrival date & time 03/07/12  0803   First MD Initiated Contact with Patient 03/07/12 0818      Chief Complaint  Patient presents with  . Leg Pain    (Consider location/radiation/quality/duration/timing/severity/associated sxs/prior treatment) HPI This 71 year old female has 3 days of right lower leg calf pain without color change swelling weakness numbness or trauma. She is no pain to her hip knee ankle or foot. She has had DVT in her left leg in the past as well as pulmonary embolism in the past. She was last in anticoagulants about 4 years ago. She is no chest pain shortness breath lightheadedness. She has no travel trauma or immobilization. She is no active cancer. She is no chest pain shortness breath abdominal pain vomiting. There is no treatment prior to arrival. Her pain is mild. She just wants to make sure she doesn't have a blood clot. Past Medical History  Diagnosis Date  . Hypertension   . PE (pulmonary embolism)   . Thyroid disease     thyroid cancer  . Cancer     thyroid    Past Surgical History  Procedure Date  . Appendectomy   . Thyroid surgery   . Liver biopsy   . Abdominal hysterectomy   . Breast biopsy     No family history on file.  History  Substance Use Topics  . Smoking status: Never Smoker   . Smokeless tobacco: Not on file  . Alcohol Use: No    OB History    Grav Para Term Preterm Abortions TAB SAB Ect Mult Living                  Review of Systems 10 Systems reviewed and are negative for acute change except as noted in the HPI. Allergies  Codeine  Home Medications   Current Outpatient Rx  Name Route Sig Dispense Refill  . LEVOTHYROXINE SODIUM 112 MCG PO TABS Oral Take 112 mcg by mouth daily.    Marland Kitchen VITAMIN D 2000 UNITS PO TABS Oral Take 4,000 Units by mouth daily.      Marland Kitchen VITAMIN B 12 PO Oral Take 2,500 mg by mouth daily.      Marland Kitchen DILTIAZEM HCL ER COATED BEADS 360 MG PO CP24 Oral Take 360 mg by mouth  daily.      Marland Kitchen FLUTICASONE PROPIONATE 50 MCG/ACT NA SUSP Nasal Place 2 sprays into the nose daily.      . IRBESARTAN 300 MG PO TABS Oral Take 300 mg by mouth at bedtime.      Marland Kitchen LORAZEPAM 1 MG PO TABS Oral Take 0.5 mg by mouth at bedtime.      . MULTI-VITAMIN/MINERALS PO TABS Oral Take 1 tablet by mouth daily.      Marland Kitchen PROBIOTIC ACIDOPHILUS PO CAPS Oral Take 0.5 capsules by mouth daily.        BP 166/85  Pulse 75  Temp 98.1 F (36.7 C)  Resp 18  SpO2 97%  Physical Exam  Nursing note and vitals reviewed. Constitutional:       Awake, alert, nontoxic appearance.  HENT:  Head: Atraumatic.  Eyes: Right eye exhibits no discharge. Left eye exhibits no discharge.  Neck: Neck supple.  Cardiovascular: Normal rate and regular rhythm.   No murmur heard. Pulmonary/Chest: Effort normal and breath sounds normal. No respiratory distress. She has no wheezes. She has no rales. She exhibits no tenderness.  Abdominal: Soft. Bowel sounds are normal. There  is no tenderness. There is no rebound.  Musculoskeletal: She exhibits tenderness. She exhibits no edema.       Baseline ROM, no obvious new focal weakness. Both arms and left leg are nontender. The right leg is no tenderness to the hip thigh knee ankle or foot. The right foot his dorsalis pedis pulse intact. The right foot has normal light touch as does the rest of the right leg with right foot having capillary refill less than 2 seconds normal light touch good range of motion no weakness. The right calf is mildly tender without swelling or color change. The right calf measures approximately 2 cm smaller than the left calf with patient's prior DVT in her left leg so her left leg is always larger than her right.   The right popliteal fossa and thigh again are nontender as is the right groin.  Neurological:       Mental status and motor strength appears baseline for patient and situation.  Skin: No rash noted.  Psychiatric: She has a normal mood and affect.     ED Course  Procedures (including critical care time) I discussed the patient's case with the vascular technician on call at [facility] long the emergency department as well as the [facility] long emergency department charge nurse and emergency physician on duty Dr. Weldon Inches who accepts the patient in transfer to obtain vascular duplex ultrasound imaging study of the patient's right leg to rule out DVT. The emergency department visit will be continued at [facility] long with the patient's disposition decided at [facility] long ED. Labs Reviewed - No data to display No results found.   Dx: Right lower leg pain r/o DVT   MDM  The patient appears reasonably stabilized for transfer considering the current resources, flow, and capabilities available in the ED at this time, and I doubt any other [facility] requiring further screening and/or treatment in the ED prior to transfer.       Hurman Horn, MD 03/07/12 [phone]

## 2012-03-07 NOTE — ED Notes (Signed)
US at bedside

## 2012-03-07 NOTE — ED Provider Notes (Signed)
History     CSN: [id number]  Arrival date & time 03/07/12  0803   First MD Initiated Contact with Patient 03/07/12 0818      Chief Complaint  Patient presents with  . Leg Pain    (Consider location/radiation/quality/duration/timing/severity/associated sxs/prior treatment) HPI Comments: [name] 71 y.o. female   The chief complaint is: Patient presents with:   Leg Pain  Past Medical History:   Hypertension                                                 PE (pulmonary embolism)                                      Thyroid disease                                                Comment:thyroid cancer   Cancer                                                         Comment:thyroid   71 year old female with past medical history of DVT and pulmonary embolism. Presents today with right lower leg pain. She was seen earlier today at Mrs.: The emergency department by Dr. Lestine Box and transferred here for evaluation of DVT. As there are no ultrasound technicians at come today. Patient states that pain began in right  LE at that time. Pain was a 9/10. States that she cannot find a comfortable position. She denies cramping. Patient states that pain seemed to resolve somewhat overnight the next morning. She had excessive pain AGAIN. Describes the pain as aching, area. It does not radiate. She took one baby aspirin. Which seemed to room believe her symptoms for some time. At night. Her pain in her right leg came back. This time. It was focused more in the calf. She states that she can feel a hard knot in the posterior calf. She states her pain. Her pain right now is 5/10. Pain is worse with palpation and walking. She states that it feels similar to her previous DVT. She is not on any blood thinners. Currently, she does not take aspirin to 2 a history of ischemic colitis. She had a lot of difficulty sleeping and cannot find a comfortable position. Last night. The. She denies chest pain,  shortness of breath, cough, or hemoptysis.  She denies any fevers, chills, nausea or vomiting, myalgias, or arthralgias.  Patient is a 71 y.o. female presenting with leg pain. The history is provided by the patient and medical records. No language interpreter was used.  Leg Pain  The incident occurred more than 2 days ago. The incident occurred at home. There was no injury mechanism. The pain is present in the right leg. The quality of the pain is described as aching. The pain is at a severity of 7/10. The patient is experiencing no pain (Currently without pain). Pertinent negatives include no numbness, no inability to  bear weight, no loss of motion, no muscle weakness, no loss of sensation and no tingling. She reports no foreign bodies present. The symptoms are aggravated by bearing weight and palpation. Treatments tried: Baby aspirin on Saturday. The treatment provided significant relief.    Past Medical History  Diagnosis Date  . Hypertension   . PE (pulmonary embolism)   . Thyroid disease     thyroid cancer  . Cancer     thyroid    Past Surgical History  Procedure Date  . Appendectomy   . Thyroid surgery   . Liver biopsy   . Abdominal hysterectomy   . Breast biopsy     No family history on file.  History  Substance Use Topics  . Smoking status: Never Smoker   . Smokeless tobacco: Not on file  . Alcohol Use: No    OB History    Grav Para Term Preterm Abortions TAB SAB Ect Mult Living                  Review of Systems  Constitutional: Negative for fever and chills.  Respiratory: Negative for chest tightness and shortness of breath.   Cardiovascular: Positive for leg swelling. Negative for chest pain and palpitations.  Gastrointestinal: Negative for nausea, vomiting, abdominal pain and diarrhea.  Genitourinary: Negative for dysuria, frequency and hematuria.  Musculoskeletal: Positive for gait problem. Negative for myalgias, back pain and arthralgias.  Skin: Negative  for rash.  Neurological: Negative for tingling and numbness.    Allergies  Codeine  Home Medications   Current Outpatient Rx  Name Route Sig Dispense Refill  . LEVOTHYROXINE SODIUM 112 MCG PO TABS Oral Take 112 mcg by mouth daily.    Marland Kitchen VITAMIN D 2000 UNITS PO TABS Oral Take 4,000 Units by mouth daily.      Marland Kitchen VITAMIN B 12 PO Oral Take 2,500 mg by mouth daily.      Marland Kitchen DILTIAZEM HCL ER COATED BEADS 360 MG PO CP24 Oral Take 360 mg by mouth daily.      Marland Kitchen FLUTICASONE PROPIONATE 50 MCG/ACT NA SUSP Nasal Place 2 sprays into the nose daily.      . IRBESARTAN 300 MG PO TABS Oral Take 300 mg by mouth at bedtime.      Marland Kitchen LORAZEPAM 1 MG PO TABS Oral Take 0.5 mg by mouth at bedtime.      . MULTI-VITAMIN/MINERALS PO TABS Oral Take 1 tablet by mouth daily.      Marland Kitchen PROBIOTIC ACIDOPHILUS PO CAPS Oral Take 0.5 capsules by mouth daily.        BP 148/69  Pulse 65  Temp 98.1 F (36.7 C) (Oral)  Resp 24  SpO2 97%  Physical Exam  Constitutional: She is oriented to person, place, and time. She appears well-developed and well-nourished. No distress.  HENT:  Head: Normocephalic and atraumatic.  Eyes: Conjunctivae normal are normal. No scleral icterus.  Neck: Normal range of motion.  Cardiovascular: Normal rate, regular rhythm and normal heart sounds.  Exam reveals no gallop and no friction rub.   No murmur heard. Pulmonary/Chest: Effort normal and breath sounds normal. No respiratory distress.  Abdominal: Soft. Bowel sounds are normal. She exhibits no distension and no mass. There is no tenderness. There is no guarding.  Musculoskeletal:       Right lower leg: She exhibits tenderness, swelling and edema. She exhibits no bony tenderness, no deformity and no laceration.       Legs:  He should wear his compression stockings were venous stasis. There is bilateral swelling. Edema is equal bilaterally. There is a firm mass in the right gastroc area. Proximal to the knee. He is exquisitely tender to  palpation. Sensation, and pulses are intact bilaterally. No ecchymosis. No erythema.  Neurological: She is alert and oriented to person, place, and time.  Skin: Skin is warm and dry. She is not diaphoretic.    ED Course  Procedures (including critical care time)  Labs Reviewed  D-DIMER, QUANTITATIVE - Abnormal; Notable for the following:    D-Dimer, Quant 2.22 (*)     All other components within normal limits   No results found.   1. Pain of right lower leg   2. Thrombophlebitis leg   3. Polycythemia       MDM  Patient has history of DVT and pulmonary embolism. D-dimer is elevated. Awaiting DVT study. Currently. Patient has also had recent history of cramps in her feet. I'm ordering blood work to look at her electrolytes. She denies any polyuria, polydipsia. She denies any recent episodes of fluid loss such as diarrhea, or sweating.   Doppler US shows superficial thrombophlebitis.  I have informed patient that she will be safe for d/c.  I have also spoken with patient about finding of elevated RBC, may represent polycythemia vera, which could increase her chances of hypercoagulability.  Patient understands and will f/u op with here PCP Dr.  Jarome Matin.  All questions answered fully.  Instructions for Supportive care provided. Discussed reasons to seek immediate care. Patient expresses understanding and agrees with plan.         Arthor Captain, PA-C 03/08/12 1019

## 2012-03-07 NOTE — Progress Notes (Signed)
VASCULAR LAB PRELIMINARY  PRELIMINARY  PRELIMINARY  PRELIMINARY  Right lower extremity venous Doppler completed.    Preliminary report:   There is no DVT noted in the right lower extremity.  There is SVT noted in the posterior thigh into the popliteal fossa.  Katyana Trolinger, 03/07/2012, 2:31 PM

## 2012-03-07 NOTE — ED Notes (Signed)
Pt reports leg pain starting Friday to front of right lower leg. Pt reports pain worse last night and this AM 5/10.  Pt denies pain at this time. Pt denies swelling, redness, recent travel or prolonged periods of being sedentary. Pt reports history of DVT in 2008. Pt states she took a baby aspirin on Saturday and that seemed to improve pain.

## 2012-03-07 NOTE — ED Notes (Signed)
Patient c/o r lower leg pain since Friday, took aspirin around 8am yesterday and it stopped hurting, but it started hurting again last night, throbbing. Concerned because she had a blood clot in her other leg in the past

## 2012-03-08 DIAGNOSIS — M79609 Pain in unspecified limb: Secondary | ICD-10-CM

## 2012-03-12 NOTE — ED Provider Notes (Signed)
Medical screening examination/treatment/procedure(s) were performed by non-physician practitioner and as supervising physician I was immediately available for consultation/collaboration.   Suzi Roots, MD 03/12/12 [phone]

## 2012-04-14 ENCOUNTER — Telehealth: Payer: Self-pay | Admitting: Hematology and Oncology

## 2012-04-14 NOTE — Telephone Encounter (Signed)
S/W pt in re NP appt 11/21 @ 9:30 w/Dr. Dalene Carrow.  Referring Dr. Dossie Arbour Dx-Unspec coag defects Welcome packet mailed.

## 2012-04-14 NOTE — Telephone Encounter (Signed)
C/D 04/14/12 for appt 04/29/12

## 2012-04-29 ENCOUNTER — Telehealth: Payer: Self-pay | Admitting: Hematology and Oncology

## 2012-04-29 ENCOUNTER — Other Ambulatory Visit: Payer: Self-pay | Admitting: *Deleted

## 2012-04-29 ENCOUNTER — Ambulatory Visit (HOSPITAL_BASED_OUTPATIENT_CLINIC_OR_DEPARTMENT_OTHER): Payer: Medicare Other

## 2012-04-29 ENCOUNTER — Ambulatory Visit (HOSPITAL_BASED_OUTPATIENT_CLINIC_OR_DEPARTMENT_OTHER): Payer: Medicare Other | Admitting: Lab

## 2012-04-29 ENCOUNTER — Ambulatory Visit (HOSPITAL_BASED_OUTPATIENT_CLINIC_OR_DEPARTMENT_OTHER): Payer: Medicare Other | Admitting: Hematology and Oncology

## 2012-04-29 ENCOUNTER — Encounter: Payer: Self-pay | Admitting: Hematology and Oncology

## 2012-04-29 VITALS — BP 166/82 | HR 74 | Temp 98.4°F | Resp 20 | Ht 64.0 in | Wt 216.4 lb

## 2012-04-29 DIAGNOSIS — I2699 Other pulmonary embolism without acute cor pulmonale: Secondary | ICD-10-CM

## 2012-04-29 DIAGNOSIS — I82409 Acute embolism and thrombosis of unspecified deep veins of unspecified lower extremity: Secondary | ICD-10-CM

## 2012-04-29 DIAGNOSIS — Z86711 Personal history of pulmonary embolism: Secondary | ICD-10-CM

## 2012-04-29 DIAGNOSIS — D6859 Other primary thrombophilia: Secondary | ICD-10-CM

## 2012-04-29 DIAGNOSIS — Z86718 Personal history of other venous thrombosis and embolism: Secondary | ICD-10-CM

## 2012-04-29 DIAGNOSIS — Z85850 Personal history of malignant neoplasm of thyroid: Secondary | ICD-10-CM

## 2012-04-29 LAB — CBC WITH DIFFERENTIAL/PLATELET
Basophils Absolute: 0.1 10*3/uL (ref 0.0–0.1)
EOS%: 1 % (ref 0.0–7.0)
Eosinophils Absolute: 0.1 10*3/uL (ref 0.0–0.5)
HCT: 44.3 % (ref 34.8–46.6)
HGB: 15 g/dL (ref 11.6–15.9)
MCH: 30.6 pg (ref 25.1–34.0)
MONO#: 0.9 10*3/uL (ref 0.1–0.9)
NEUT#: 4.7 10*3/uL (ref 1.5–6.5)
NEUT%: 58.4 % (ref 38.4–76.8)
RDW: 13.7 % (ref 11.2–14.5)
WBC: 8.1 10*3/uL (ref 3.9–10.3)
lymph#: 2.3 10*3/uL (ref 0.9–3.3)

## 2012-04-29 LAB — COMPREHENSIVE METABOLIC PANEL (CC13)
ALT: 21 U/L (ref 0–55)
AST: 18 U/L (ref 5–34)
Calcium: 9.8 mg/dL (ref 8.4–10.4)
Chloride: 104 mEq/L (ref 98–107)
Creatinine: 0.8 mg/dL (ref 0.6–1.1)
Sodium: 141 mEq/L (ref 136–145)
Total Bilirubin: 0.55 mg/dL (ref 0.20–1.20)
Total Protein: 7.3 g/dL (ref 6.4–8.3)

## 2012-04-29 NOTE — Patient Instructions (Addendum)
[name]  [id number]   [facility] - AFTER VISIT SUMMARY   **RECOMMENDATIONS MADE BY THE CONSULTANT AND ANY TEST    RESULTS WILL BE SENT TO YOUR REFERRING DOCTORS.   YOUR EXAM FINDINGS, LABS AND RESULTS WERE DISCUSSED BY YOUR MD TODAY.  YOU CAN GO TO THE [facility] WEB SITE FOR INSTRUCTIONS ON HOW TO ASSESS MY CHART FOR ADDITIONAL INFORMATION AS NEEDED.  Your Updated drug allergies are: Allergies as of 04/29/2012 - Review Complete 03/07/2012  Allergen Reaction Noted  . Codeine Other (See Comments) 03/16/2011  . Latex Rash 03/07/2012    Your current list of medications are: Current Outpatient Prescriptions  Medication Sig Dispense Refill  . aspirin 81 MG chewable tablet Chew 1 tablet (81 mg total) by mouth daily.  30 tablet  0  . aspirin EC 81 MG tablet Take 81 mg by mouth once.      . diltiazem (CARDIZEM CD) 360 MG 24 hr capsule Take 360 mg by mouth daily.        . fluticasone (FLONASE) 50 MCG/ACT nasal spray Place 2 sprays into the nose daily as needed. For allergies.      Marland Kitchen irbesartan (AVAPRO) 300 MG tablet Take 300 mg by mouth at bedtime.        Marland Kitchen levothyroxine (SYNTHROID, LEVOTHROID) 112 MCG tablet Take 112 mcg by mouth daily.      Marland Kitchen LORazepam (ATIVAN) 1 MG tablet Take 0.5 mg by mouth at bedtime.           INSTRUCTIONS GIVEN AND DISCUSSED:  See attached schedule   SPECIAL INSTRUCTIONS/FOLLOW-UP:  See above.  I acknowledge that I have been informed and understand all the instructions given to me and received a copy.I know to contact the clinic, my physician, or go to the emergency Department if any problems should occur.   I do not have any more questions at this time, but understand that I may call the [facility] at [phone] during business hours should I have any further questions or need assistance in obtaining follow-up care.

## 2012-04-29 NOTE — Progress Notes (Signed)
This office note has been dictated.

## 2012-04-29 NOTE — Progress Notes (Signed)
Dr.    Jarome Matin     -      Primary. Dr.    Catalina Gravel      -     Cardiologist. Dr.    Kirtland Bouchard.   [place]    -      Ophthalmologist. Dr.    Greggory Brandy     -      GI.  [facility]   On   [address].  Cell   Phone      [phone].

## 2012-04-29 NOTE — Telephone Encounter (Signed)
gv and printed appt schedule for pt for NOV.Marland Kitchenthe patient aware that central scheduling will call with d/t appt for ct and ultrasound.

## 2012-04-30 LAB — ERYTHROPOIETIN: Erythropoietin: 98.2 m[IU]/mL — ABNORMAL HIGH (ref 2.6–34.0)

## 2012-04-30 LAB — FACTOR 8 ASSAY: Coagulation Factor VIII: 172 % — ABNORMAL HIGH (ref 73–140)

## 2012-04-30 NOTE — Progress Notes (Signed)
CC:   Barry Dienes. [name], M.D.  REFERRING PHYSICIAN:  Barry Dienes. [name], M.D.  IDENTIFYING STATEMENT:  The patient is a 71 year old woman seen at the request of Dr. Jarold Motto with coagulation defects.  HISTORY OF PRESENT ILLNESS:  The patient is here with her husband.  She is a good historian.  She recalls that she has a past medical history significant for left lower extremity deep vein thrombosis and bilateral pulmonary emboli diagnosed on 05/24/07.  The patient states she was hospitalized for 8 days, was placed on heparin, and eventually bridged to Coumadin which she took for approximately 6 months, ending in July 2009.  She reports that more recently she had been seen in the emergency room with right lower extremity calf pain.  She received a right lower extremity Doppler on 03/06/2012 that showed no evidence of deep vein thrombosis.  It was felt she had thrombophlebitis and was treated appropriately and conservatively and asked to wear TED hose stockings as needed.  However, part of her workup performed in the emergency room had also shown that her hemoglobin and hematocrit were elevated at 16 and 47.4, respectively, on 03/07/2012.  Her white cell count was normal at 8.6, platelets 255.  D-dimer was also obtained and was elevated at 2.22.  The patient reports that she was told that her oxygen saturation was low.  She tells me that she also was placed on ambulatory monitoring which confirmed it, and was placed on oxygen nocturnally.  She is undergoing evaluation for sleep apnea.  Dr. Jarold Motto also performed a hypercoagulable workup in his office in 03/2012 and results noted the following:  Functional protein-C 152%.  Functional protein-S 121%.  Antithrombin III activity 113; anticardiolipin IgG less than 10, anticardiolipin IgM was elevated at 150, and anticardiolipin IgA antibody 15; beta 2 glycoprotein IgG, IgM, and IgA were less than 10, 14, and 10, respectively.  There was  no evidence of the lupus anticoagulant.  Factor V Leiden and the prothrombin gene mutation were both negative.  The patient denies shortness of breath or chest pain, but she also notes that the pain in the right lower extremity has resolved.  PAST MEDICAL HISTORY: 1. Pulmonary embolism and DVT diagnosed on 05/24/07 and was     for 6 months on anticoagulation. 2. Hypertension. 3. History of thyroid cancer, status post partial thyroidectomy in     1970.  Did not require radiation or chemotherapy. 4. Appendectomy. 5. History of liver biopsy to evaluate a liver lesion which was benign     in 1972. 6. History of ischemic colitis, resolved. 7. Possible history of pulmonary sarcoid. 8. Status post hysterectomy.  ALLERGIES:  Codeine and latex.  MEDICATIONS:  Avapro 150 mg daily, Taztia 360 mg daily, Synthroid 112 mcg daily, Ativan 0.5 mg as needed, fluticasone 1 spray daily, aspirin 81 mg daily, oxygen 2 L.  SOCIAL HISTORY:  The patient is married with 1 child.  Denies alcohol or tobacco use.  She is retired Herbalist.  She resides in [place].  FAMILY HISTORY:  The patient's mother had breast cancer.  In addition, her 2 sisters also had breast cancer.  HEALTH MAINTENANCE:  Colonoscopy was last in 2008.  She gets annual mammograms, her last was in July of this year, which she reports as being unremarkable.  REVIEW OF SYSTEMS:  Denies fever, chills, night sweats, anorexia, weight loss.  GI:  Denies nausea, vomiting, abdominal pain, diarrhea, melena, hematochezia.  GU:  Denies dysuria, hematuria, nocturia, frequency. Skin:  No bruising or bleeding.  Musculoskeletal:  Denies joint aches, muscle pains.  Rest of review of systems negative.  PHYSICAL EXAM:  General:  The patient is a well-appearing, well- nourished woman in no distress.  Vitals:  Pulse 74, blood pressure 166/82, temperature 98.4, respirations 20, weight 216 pounds.  HEENT: Head is atraumatic, normocephalic.   Sclerae anicteric.  Mouth no thrush. Neck:  Supple.  Chest:  Clear to percussion and auscultation.  CVS: Unremarkable.  Abdomen:  Soft.  No hepatomegaly.  Bowel sounds present. Extremities:  No edema.  CNS:  Nonfocal.  Lymph Nodes:  No adenopathy.  IMPRESSION AND PLAN:  Mrs. Hoganson is a pleasant 71 year old woman who is being evaluated for a hypercoagulable state and also to rule out polycythemia vera.  Her history is remarkable in that she developed a pulmonary embolism and deep vein thrombosis in 2008.  Was placed on 6 months of anticoagulation.  A recent evaluation had noted slightly elevated D-dimers.  D-dimers are essentially not specific for blood clot, but her history is remarkable in that she has history of low arterial oxygen saturation and this can cause a secondary erythrocytosis.  My concern is she may have chronic pulmonary emboli, so my recommendations are that she will have a spiral CT of the chest scheduled.  In addition, I also note that cardiolipin IgM was also elevated.  Would also recommend she also obtain a JAK2 mutation just to rule out an underlying polycythemia vera.  An ultrasound of the abdomen to rule out cysts.  She will also obtain erythropoietin level.  Also it would be a good idea to obtain a factor VIII assay.  She also will obtain routine CBC with differential and comprehensive metabolic panel. She returns to discuss results.    ______________________________ Laurice Record, M.D. LIO/MEDQ  D:  04/29/2012  T:  04/30/2012  Job:  [id number]

## 2012-05-05 ENCOUNTER — Ambulatory Visit (HOSPITAL_COMMUNITY)
Admission: RE | Admit: 2012-05-05 | Discharge: 2012-05-05 | Disposition: A | Discharge status: Home / Self Care | Payer: Medicare Other | Source: Ambulatory Visit | Attending: Hematology and Oncology | Admitting: Hematology and Oncology

## 2012-05-05 DIAGNOSIS — I2699 Other pulmonary embolism without acute cor pulmonale: Secondary | ICD-10-CM | POA: Insufficient documentation

## 2012-05-05 DIAGNOSIS — K824 Cholesterolosis of gallbladder: Secondary | ICD-10-CM | POA: Insufficient documentation

## 2012-05-05 DIAGNOSIS — D45 Polycythemia vera: Secondary | ICD-10-CM | POA: Insufficient documentation

## 2012-05-05 DIAGNOSIS — I82409 Acute embolism and thrombosis of unspecified deep veins of unspecified lower extremity: Secondary | ICD-10-CM

## 2012-05-05 MED ORDER — IOHEXOL 300 MG/ML  SOLN
100.0000 mL | Freq: Once | INTRAMUSCULAR | Status: AC | PRN
  Administered 2012-05-05: 100 mL via INTRAVENOUS

## 2012-05-07 ENCOUNTER — Other Ambulatory Visit: Payer: Self-pay | Admitting: *Deleted

## 2012-05-07 ENCOUNTER — Encounter: Payer: Self-pay | Admitting: Hematology and Oncology

## 2012-05-07 ENCOUNTER — Telehealth: Payer: Self-pay | Admitting: *Deleted

## 2012-05-07 ENCOUNTER — Telehealth: Payer: Self-pay | Admitting: Hematology and Oncology

## 2012-05-07 ENCOUNTER — Ambulatory Visit (HOSPITAL_BASED_OUTPATIENT_CLINIC_OR_DEPARTMENT_OTHER): Payer: Medicare Other | Admitting: Hematology and Oncology

## 2012-05-07 VITALS — BP 146/79 | HR 82 | Temp 98.1°F | Resp 20 | Ht 64.0 in | Wt 218.7 lb

## 2012-05-07 DIAGNOSIS — I2699 Other pulmonary embolism without acute cor pulmonale: Secondary | ICD-10-CM

## 2012-05-07 DIAGNOSIS — I2782 Chronic pulmonary embolism: Secondary | ICD-10-CM

## 2012-05-07 DIAGNOSIS — D6859 Other primary thrombophilia: Secondary | ICD-10-CM

## 2012-05-07 DIAGNOSIS — Z86718 Personal history of other venous thrombosis and embolism: Secondary | ICD-10-CM

## 2012-05-07 DIAGNOSIS — Z86711 Personal history of pulmonary embolism: Secondary | ICD-10-CM

## 2012-05-07 MED ORDER — ENOXAPARIN SODIUM 150 MG/ML ~~LOC~~ SOLN
150.0000 mg | Freq: Once | SUBCUTANEOUS | Status: AC
  Administered 2012-05-07: 150 mg via SUBCUTANEOUS
  Filled 2012-05-07: qty 1

## 2012-05-07 MED ORDER — RIVAROXABAN 20 MG PO TABS: 20.0000 mg | ORAL_TABLET | Freq: Every day | ORAL | Status: DC

## 2012-05-07 MED ORDER — ENOXAPARIN SODIUM 150 MG/ML ~~LOC~~ SOLN: 150.0000 mg | Freq: Every day | SUBCUTANEOUS | Status: DC

## 2012-05-07 NOTE — Telephone Encounter (Signed)
gve the pt her dec appt calendar appt . Pt is aware that we will contact her with the pulmonary appt on Monday since their office was closed for the thanksgiving holiday. Original pof date for the pt's appt was for 05/13/2012.  The md did not have any late afternoon apts for this pt. S/w melissa who clarified the appt with dr odogwu who stated the appt should be on 05/14/2012. Made the changes on the schedule.

## 2012-05-07 NOTE — Progress Notes (Signed)
This office note has been dictated.

## 2012-05-07 NOTE — Telephone Encounter (Signed)
Pt was instructed on self administration of Lovenox during office visit today.   Pt was able to redemonstrate back correct technique to nurse.   Pt received first dose of Lovenox 150 mg after visit today.

## 2012-05-07 NOTE — Progress Notes (Signed)
CC:   Barry Dienes. [name], M.D.  IDENTIFYING STATEMENT:  The patient is a 71 year old woman who presents to discuss results.  INTERVAL HISTORY:  To summarize, the patient has a history for a left lower extremity deep vein thrombosis with bilateral pulmonary emboli diagnosed on 05/24/2007.  Was on 6 months of anticoagulation completing it in July 2009.  Was found to recently have elevated D-dimers at 2.2. She was also found to have low oxygen saturation and has been placed on oxygen as needed.  A hypercoagulable profile had noted an elevated anticardiolipin IgM antibody.  Results note the following:  A CT angiogram of the chest on 05/05/2012 had shown small filling defects in the pulmonary arteries bilaterally which were linear and nonobstructive.  It is suspected that these emboli were more chronic than acute.  Extensive calcifications were seen throughout the chest suggesting old granulomatous disease.  There was a 1 3 mm pleural based nodule in the left lower lobe.  JAK2 mutation analysis was negative; an ultrasound of the abdomen to evaluate for cysts was unremarkable except for a small gallbladder polyp.  Ferritin 65.  Erythropoietin 98.2.  Factor VIII assay 172.  MEDICATIONS:  Reviewed and updated.  ALLERGIES:  Codeine and latex.  PAST MEDICAL HISTORY/FAMILY HISTORY/SOCIAL HISTORY:  Unchanged.  REVIEW OF SYSTEMS:  Shortness of breath on extreme exertion with intermittent dry cough.  Denies hemoptysis.  Denies lightheadedness. Symptoms appear to improve with oxygen.  Rest of review of systems negative.  PHYSICAL EXAM:  General:  The patient is a well-appearing, well- nourished woman in no distress.  Vitals:  Pulse 82, blood pressure 146/79, temperature 98.1, respirations 20, weight 218.7 pounds.  HEENT: Head is atraumatic, normocephalic.  Sclerae anicteric.  Mouth moist. Chest:  Clear.  Abdomen:  Soft, nontender.  Bowel sounds present. Extremities:  No calf tenderness  edema.  LABORATORY DATA:  As above.  In addition, on 04/29/2012 white cell count 8.1, hemoglobin 15, hematocrit 44.3, platelets 261.  Sodium 141, potassium 4.2, chloride 104, CO2 30, BUN 13, creatinine 0.8, glucose 93, T bilirubin 0.55, alkaline phosphatase 126, AST 18, ALT 21, calcium 9.8, LDH 202.  Results of a CT angiogram as above.  IMPRESSION AND PLAN:  Ms. Zirkle is a 71 year old woman with bilateral chronic emboli.  The patient has history for a pulmonary embolism and deep venous thrombosis in 2008 and received 6 months of anticoagulation.  She also has mildly elevated IgM cardiolipin antibodies. Erythrocytosis is likely related to low oxygenation as a result of emboli.  My recommendation at this time is that she requires indefinite anticoagulation.  Will initially treat with Lovenox dosed at 1.5 mg/kg subcu daily.  The patient will be assessed in a week's time.  If stable enough will switch to Xarelto pending insurance approval.  I have also indicated that she will require a pulmonology consultation and I have made a referral.  She will also follow up with Dr. [name].    ______________________________ Laurice Record, M.D. LIO/MEDQ  D:  05/07/2012  T:  05/07/2012  Job:  [id number]

## 2012-05-07 NOTE — Patient Instructions (Addendum)
[name]  [id number]   [facility] - AFTER VISIT SUMMARY   **RECOMMENDATIONS MADE BY THE CONSULTANT AND ANY TEST    RESULTS WILL BE SENT TO YOUR REFERRING DOCTORS.   YOUR EXAM FINDINGS, LABS AND RESULTS WERE DISCUSSED BY YOUR MD TODAY.  YOU CAN GO TO THE [place] WEB SITE FOR INSTRUCTIONS ON HOW TO ASSESS MY CHART FOR ADDITIONAL INFORMATION AS NEEDED.  Your Updated drug allergies are: Allergies as of 05/07/2012 - Review Complete 04/29/2012  Allergen Reaction Noted  . Codeine Other (See Comments) 03/16/2011  . Latex Rash 03/07/2012    Your current list of medications are: Current Outpatient Prescriptions  Medication Sig Dispense Refill  . aspirin 81 MG chewable tablet Chew 1 tablet (81 mg total) by mouth daily.  30 tablet  0  . diltiazem (CARDIZEM CD) 360 MG 24 hr capsule Take 360 mg by mouth daily.        . fluticasone (FLONASE) 50 MCG/ACT nasal spray Place 1 spray into the nose daily. For allergies.      Marland Kitchen irbesartan (AVAPRO) 300 MG tablet Take 150 mg by mouth at bedtime.       Marland Kitchen levothyroxine (SYNTHROID, LEVOTHROID) 112 MCG tablet Take 112 mcg by mouth daily.      Marland Kitchen LORazepam (ATIVAN) 1 MG tablet Take 0.5 mg by mouth as needed.          INSTRUCTIONS GIVEN AND DISCUSSED:  See attached schedule   SPECIAL INSTRUCTIONS/FOLLOW-UP:  See above.  I acknowledge that I have been informed and understand all the instructions given to me and received a copy.I know to contact the clinic, my physician, or go to the emergency Department if any problems should occur.   I do not have any more questions at this time, but understand that I may call the [facility] at [phone] during business hours should I have any further questions or need assistance in obtaining follow-up care.

## 2012-05-10 ENCOUNTER — Telehealth: Payer: Self-pay | Admitting: Hematology and Oncology

## 2012-05-10 NOTE — Telephone Encounter (Signed)
s.w. pt and advised on 12.11.13 appt with Dr Sherene Sires @ 9:45am...pt advised that was not a good date....gave her pulmonary # so that she could reschedule

## 2012-05-14 ENCOUNTER — Ambulatory Visit (HOSPITAL_BASED_OUTPATIENT_CLINIC_OR_DEPARTMENT_OTHER): Payer: Medicare Other | Admitting: Hematology and Oncology

## 2012-05-14 ENCOUNTER — Encounter: Payer: Self-pay | Admitting: Hematology and Oncology

## 2012-05-14 ENCOUNTER — Other Ambulatory Visit (HOSPITAL_BASED_OUTPATIENT_CLINIC_OR_DEPARTMENT_OTHER): Payer: Medicare Other | Admitting: Lab

## 2012-05-14 ENCOUNTER — Telehealth: Payer: Self-pay | Admitting: Hematology and Oncology

## 2012-05-14 VITALS — BP 162/88 | HR 77 | Temp 97.9°F | Resp 20 | Ht 64.0 in | Wt 214.1 lb

## 2012-05-14 DIAGNOSIS — I2699 Other pulmonary embolism without acute cor pulmonale: Secondary | ICD-10-CM

## 2012-05-14 DIAGNOSIS — I82409 Acute embolism and thrombosis of unspecified deep veins of unspecified lower extremity: Secondary | ICD-10-CM

## 2012-05-14 DIAGNOSIS — D6859 Other primary thrombophilia: Secondary | ICD-10-CM

## 2012-05-14 DIAGNOSIS — I2782 Chronic pulmonary embolism: Secondary | ICD-10-CM

## 2012-05-14 LAB — CBC WITH DIFFERENTIAL/PLATELET
Basophils Absolute: 0 10*3/uL (ref 0.0–0.1)
EOS%: 1.6 % (ref 0.0–7.0)
Eosinophils Absolute: 0.1 10*3/uL (ref 0.0–0.5)
HCT: 41.9 % (ref 34.8–46.6)
HGB: 14.1 g/dL (ref 11.6–15.9)
MCH: 30.6 pg (ref 25.1–34.0)
MCV: 90.9 fL (ref 79.5–101.0)
MONO%: 11.8 % (ref 0.0–14.0)
NEUT#: 5.2 10*3/uL (ref 1.5–6.5)
NEUT%: 58.1 % (ref 38.4–76.8)
RDW: 13.8 % (ref 11.2–14.5)

## 2012-05-14 NOTE — Progress Notes (Signed)
This office note has been dictated.

## 2012-05-14 NOTE — Telephone Encounter (Signed)
gv pt appt schedule for September 2014.

## 2012-05-14 NOTE — Patient Instructions (Addendum)
[name]  [id number]   [facility] - AFTER VISIT SUMMARY   **RECOMMENDATIONS MADE BY THE CONSULTANT AND ANY TEST    RESULTS WILL BE SENT TO YOUR REFERRING DOCTORS.   YOUR EXAM FINDINGS, LABS AND RESULTS WERE DISCUSSED BY YOUR MD TODAY.  YOU CAN GO TO THE [place] WEB SITE FOR INSTRUCTIONS ON HOW TO ASSESS MY CHART FOR ADDITIONAL INFORMATION AS NEEDED.  Your Updated drug allergies are: Allergies as of 05/14/2012 - Review Complete 05/14/2012  Allergen Reaction Noted  . Codeine Other (See Comments) 03/16/2011  . Latex Rash 03/07/2012    Your current list of medications are: Current Outpatient Prescriptions  Medication Sig Dispense Refill  . aspirin 81 MG chewable tablet Chew 1 tablet (81 mg total) by mouth daily.  30 tablet  0  . diltiazem (CARDIZEM CD) 360 MG 24 hr capsule Take 360 mg by mouth daily.        Marland Kitchen enoxaparin (LOVENOX) 150 MG/ML injection Inject 1 mL (150 mg total) into the skin daily.  10 Syringe  0  . fluticasone (FLONASE) 50 MCG/ACT nasal spray Place 1 spray into the nose daily. For allergies.      Marland Kitchen irbesartan (AVAPRO) 300 MG tablet Take 150 mg by mouth at bedtime.       Marland Kitchen levothyroxine (SYNTHROID, LEVOTHROID) 112 MCG tablet Take 112 mcg by mouth daily.      Marland Kitchen LORazepam (ATIVAN) 1 MG tablet Take 0.5 mg by mouth as needed.       . Rivaroxaban (XARELTO) 20 MG TABS Take 1 tablet (20 mg total) by mouth daily. Pt to start after visit with md on 05/14/12  And as per md's instructions.  30 tablet  1     INSTRUCTIONS GIVEN AND DISCUSSED:  See attached schedule   SPECIAL INSTRUCTIONS/FOLLOW-UP:  See above.  I acknowledge that I have been informed and understand all the instructions given to me and received a copy.I know to contact the clinic, my physician, or go to the emergency Department if any problems should occur.   I do not have any more questions at this time, but understand that I may call the [facility] at [phone]  during business hours should I have any further questions or need assistance in obtaining follow-up care.

## 2012-05-15 NOTE — Progress Notes (Signed)
CC:   [name]. [name], M.D.  IDENTIFYING STATEMENT:  The patient is a 71 year old woman with hypercoagulable state who presents for followup.  INTERVAL HISTORY:  The patient was initiated on Lovenox a week along. She is tolerating this well.  She notes her breathing is a little bit. She is on oxygen.  She has no other complaints.  MEDICATIONS:  Reviewed and updated.  ALLERGIES:  Codeine and Lasix.  PHYSICAL EXAMINATION:  General:  The patient is alert and oriented x3. Vitals:  Pulse 77, blood pressure 162/88, temperature 97.9, respirations 20, weight 214 pounds.  HEENT:  Head is atraumatic, normocephalic. Sclerae anicteric.  Mouth moist.  Neck:  Supple.  Chest:  Unremarkable. Abdomen:  Soft, nontender.  No masses.  Bowel sounds present. Extremities:  No calf tenderness.  IMPRESSION AND PLAN:  [name] is a 71 year old woman with history of bilateral chronic emboli.  She has a history of pulmonary embolism and deep vein thrombosis diagnosed in 2008 and received 6 months of anticoagulation.  She also has history of mildly elevated IgM cardiolipin antibodies.  My recommendations are that she continue indefinite anticoagulation.  At this time she feels she is able to afford Xarelto for a month until she gets in donut hole.  So she will Complete Lovenox and begin Xarelto at 20 mg daily.  She follows up with Dr. [name].  She has a pulmonology consult pending.  She follows up in 9 months' time.    ______________________________ Laurice Record, M.D. LIO/MEDQ  D:  05/14/2012  T:  05/15/2012  Job:  [id number]

## 2012-05-19 ENCOUNTER — Encounter: Payer: Self-pay | Admitting: Internal Medicine

## 2012-05-19 ENCOUNTER — Ambulatory Visit (INDEPENDENT_AMBULATORY_CARE_PROVIDER_SITE_OTHER): Payer: Medicare Other | Admitting: Internal Medicine

## 2012-05-19 VITALS — BP 170/92 | HR 80 | Temp 97.9°F | Ht 62.75 in | Wt 216.2 lb

## 2012-05-19 DIAGNOSIS — G4733 Obstructive sleep apnea (adult) (pediatric): Secondary | ICD-10-CM

## 2012-05-19 DIAGNOSIS — R05 Cough: Secondary | ICD-10-CM

## 2012-05-19 DIAGNOSIS — R059 Cough, unspecified: Secondary | ICD-10-CM

## 2012-05-19 DIAGNOSIS — I2699 Other pulmonary embolism without acute cor pulmonale: Secondary | ICD-10-CM

## 2012-05-19 NOTE — Patient Instructions (Addendum)
Have Dr Gertie Fey evaluate R heart function with an echocardiogram  GERD (REFLUX)  is an extremely common cause of respiratory symptoms, many times with no significant heartburn at all.    It can be treated with medication, but also with lifestyle changes including avoidance of late meals, excessive alcohol, smoking cessation, and avoid fatty foods, chocolate, peppermint, colas, red wine, and acidic juices such as orange juice.  NO MINT OR MENTHOL PRODUCTS SO NO COUGH DROPS  USE SUGARLESS CANDY INSTEAD (jolley ranchers or Stover's)  NO OIL BASED VITAMINS - use powdered substitutes.  Prilosec 20 mg Take 30-60 min before first meal of the day x one month and if cough no better, return here.

## 2012-05-19 NOTE — Progress Notes (Signed)
Subjective:    Patient ID: [name], female    DOB: 1941/01/12  MRN: [id number]  HPI  22 yowf referred 05/19/2012 by Dr Etta Quill to pulmonary clinic for "chronic  pe"     05/19/2012 pulmonary ov cc sob dx pe 2008 assoc with L DVT  rx through July 2009  100% better (sob resolved) until early 2010 developed dry cough then R leg pain anteriorly in Sept 2013 > dx sup phlebitis and polycycthemia >  sleep eval showed low 02, formal study pending done off 02. Thinks lovenox made her cough worse.  Newspapers make her cough also x years so avoids them. Cough is dry and has no specific pattern otherwise but not typically waking her from sleep.  No sob although relatively sedentary     Kouffman Reflux v Neurogenic Cough Differentiator Reflux Comments  Do you awaken from a sound sleep coughing violently?                            With trouble breathing? Yes   Do you have choking episodes when you cannot  Get enough air, gasping for air ?              Yes   Do you usually cough when you lie down into  The bed, or when you just lie down to rest ?                          occ   Do you usually cough after meals or eating?         no   Do you cough when (or after) you bend over?    no   GERD SCORE     Kouffman Reflux v Neurogenic Cough Differentiator Neurogenic   Do you more-or-less cough all day long?  Comes and goes  Does change of temperature make you cough? sometimes   Does laughing or chuckling cause you to cough? sometimes   Do fumes (perfume, automobile fumes, burned  Toast, etc.,) cause you to cough ?      Sometimes    Does speaking, singing, or talking on the phone cause you to cough   ?               No    Neurogenic/Airway score           Review of Systems  Constitutional: Negative for fever, chills and unexpected weight change.  HENT: Positive for sneezing. Negative for ear pain, nosebleeds, congestion, sore throat, rhinorrhea, trouble swallowing, dental problem, voice  change, postnasal drip and sinus pressure.   Eyes: Negative for visual disturbance.  Respiratory: Positive for cough. Negative for choking and shortness of breath.   Cardiovascular: Negative for chest pain and leg swelling.  Gastrointestinal: Negative for vomiting, abdominal pain and diarrhea.  Genitourinary: Negative for difficulty urinating.  Musculoskeletal: Negative for arthralgias.  Skin: Negative for rash.  Neurological: Positive for headaches. Negative for tremors and syncope.  Hematological: Does not bruise/bleed easily.       Objective:   Physical Exam  Pleasant amb wf nad  Wt Readings from Last 3 Encounters:  05/19/12 216 lb 3.2 oz (98.068 kg)  05/14/12 214 lb 1.6 oz (97.115 kg)  05/07/12 218 lb 11.2 oz (99.202 kg)     HEENT: nl dentition, turbinates, and orophanx. Nl external ear canals without cough reflex   NECK :  without JVD/Nodes/TM/ nl  carotid upstrokes bilaterally   LUNGS: no acc muscle use, clear to A and P bilaterally without cough on insp or exp maneuvers   CV:  RRR  no s3 or murmur or increase in P2, no edema   ABD:  soft and nontender with nl excursion in the supine position. No bruits or organomegaly, bowel sounds nl  MS:  warm without deformities, calf tenderness, cyanosis or clubbing  SKIN: warm and dry without lesions    NEURO:  alert, approp, no deficits             Assessment & Plan:

## 2012-05-20 DIAGNOSIS — R05 Cough: Secondary | ICD-10-CM | POA: Insufficient documentation

## 2012-05-20 DIAGNOSIS — G4733 Obstructive sleep apnea (adult) (pediatric): Secondary | ICD-10-CM | POA: Insufficient documentation

## 2012-05-20 DIAGNOSIS — R059 Cough, unspecified: Secondary | ICD-10-CM | POA: Insufficient documentation

## 2012-05-20 NOTE — Assessment & Plan Note (Signed)
Doubt cough has anything to do with issue of PE - most likely this is  Upper airway cough syndrome, so named because it's frequently impossible to sort out how much is  CR/sinusitis with freq throat clearing (which can be related to primary GERD)   vs  causing  secondary (" extra esophageal")  GERD from wide swings in gastric pressure that occur with throat clearing, often  promoting self use of mint and menthol lozenges that reduce the lower esophageal sphincter tone and exacerbate the problem further in a cyclical fashion.   These are the same pts (now being labeled as having "irritable larynx syndrome" by some cough centers) who not infrequently have a history of having failed to tolerate ace inhibitors,  dry powder inhalers or biphosphonates or report having atypical reflux symptoms that don't respond to standard doses of PPI , and are easily confused as having aecopd or asthma flares by even experienced allergists/ pulmonologists.   For now rec gerd rx per guidelines then regroup p the holidays

## 2012-05-20 NOTE — Assessment & Plan Note (Addendum)
No records of w/u available but need to be sure the hypoxemia at hs is eliminated either by 02 or adequate cpap trial because of her dx of polycythemia.   Would be happy to address this issue if requested, but she already is being followed for this elsewhere (neurology/ [place])

## 2012-05-20 NOTE — Assessment & Plan Note (Signed)
Original Dx 2008  - Re CTa chest 05/05/12 c/w chronic PE  Absence of any sob (which resolved p rx in 2008) suggests she recovered completely s CTEPAH (chronic thromboembolic PAH) developing and that the polycythemia from osa is assoc with noct desat but does set her up for future dvt / pe (as does her obesity) so I have not problem with rec she stay on lifelong anticoagulation at this point  An echo needs to be done to assess whether there is any residual PAH and if so needs to be repeated in 6 months on Xarelto to see if changes. If it's only mild and doesn't change then nothing further needed.  Inf anything less than mild and the echo doesn't improve x 6 months then reasonable to send to [facility] center where there is an interest in  [facility]

## 2012-05-26 ENCOUNTER — Encounter: Payer: Self-pay | Admitting: Internal Medicine

## 2012-05-26 DIAGNOSIS — R591 Generalized enlarged lymph nodes: Secondary | ICD-10-CM | POA: Insufficient documentation

## 2012-05-26 DIAGNOSIS — R599 Enlarged lymph nodes, unspecified: Secondary | ICD-10-CM | POA: Insufficient documentation

## 2012-07-08 ENCOUNTER — Other Ambulatory Visit: Payer: Self-pay | Admitting: Dermatology

## 2012-07-31 ENCOUNTER — Encounter: Payer: Self-pay | Admitting: *Deleted

## 2012-07-31 ENCOUNTER — Telehealth: Payer: Self-pay | Admitting: *Deleted

## 2012-07-31 NOTE — Telephone Encounter (Signed)
Called pt to notify pt of rescheduled appointments. Pt agreed with New appointment and times, but stated she may have to cancel due to other health issues. Pt was advised to call scheduling department as well as triage nurse with cancellation and health issues if any problems developed.

## 2013-01-28 ENCOUNTER — Telehealth: Payer: Self-pay | Admitting: Neurology

## 2013-02-09 ENCOUNTER — Other Ambulatory Visit: Payer: Self-pay

## 2013-02-09 DIAGNOSIS — Z1231 Encounter for screening mammogram for malignant neoplasm of breast: Secondary | ICD-10-CM

## 2013-02-25 ENCOUNTER — Ambulatory Visit
Admission: RE | Admit: 2013-02-25 | Discharge: 2013-02-25 | Disposition: A | Discharge status: Home / Self Care | Payer: Medicare Other | Source: Ambulatory Visit

## 2013-02-25 DIAGNOSIS — Z1231 Encounter for screening mammogram for malignant neoplasm of breast: Secondary | ICD-10-CM

## 2013-02-28 ENCOUNTER — Other Ambulatory Visit: Payer: Medicare Other

## 2013-03-01 ENCOUNTER — Other Ambulatory Visit: Payer: Self-pay | Admitting: Internal Medicine

## 2013-03-01 DIAGNOSIS — R928 Other abnormal and inconclusive findings on diagnostic imaging of breast: Secondary | ICD-10-CM

## 2013-03-02 ENCOUNTER — Ambulatory Visit: Payer: Medicare Other | Admitting: Hematology and Oncology

## 2013-03-02 ENCOUNTER — Ambulatory Visit (HOSPITAL_BASED_OUTPATIENT_CLINIC_OR_DEPARTMENT_OTHER): Payer: Medicare Other | Admitting: Internal Medicine

## 2013-03-02 ENCOUNTER — Telehealth: Payer: Self-pay | Admitting: Internal Medicine

## 2013-03-02 ENCOUNTER — Other Ambulatory Visit: Payer: Medicare Other | Admitting: Lab

## 2013-03-02 ENCOUNTER — Encounter: Payer: Self-pay | Admitting: Internal Medicine

## 2013-03-02 ENCOUNTER — Other Ambulatory Visit (HOSPITAL_BASED_OUTPATIENT_CLINIC_OR_DEPARTMENT_OTHER): Payer: Medicare Other

## 2013-03-02 VITALS — BP 150/78 | HR 69 | Temp 97.0°F | Resp 20 | Ht 62.75 in | Wt 225.0 lb

## 2013-03-02 DIAGNOSIS — I2699 Other pulmonary embolism without acute cor pulmonale: Secondary | ICD-10-CM

## 2013-03-02 DIAGNOSIS — I82402 Acute embolism and thrombosis of unspecified deep veins of left lower extremity: Secondary | ICD-10-CM

## 2013-03-02 DIAGNOSIS — I82409 Acute embolism and thrombosis of unspecified deep veins of unspecified lower extremity: Secondary | ICD-10-CM

## 2013-03-02 LAB — CBC WITH DIFFERENTIAL/PLATELET
Basophils Absolute: 0 10*3/uL (ref 0.0–0.1)
EOS%: 1.1 % (ref 0.0–7.0)
Eosinophils Absolute: 0.1 10*3/uL (ref 0.0–0.5)
HGB: 14.9 g/dL (ref 11.6–15.9)
LYMPH%: 26.6 % (ref 14.0–49.7)
MCH: 29.9 pg (ref 25.1–34.0)
MCV: 89.1 fL (ref 79.5–101.0)
MONO%: 12.3 % (ref 0.0–14.0)
NEUT#: 4.2 10*3/uL (ref 1.5–6.5)
Platelets: 232 10*3/uL (ref 145–400)
RBC: 4.98 10*6/uL (ref 3.70–5.45)
RDW: 13.9 % (ref 11.2–14.5)

## 2013-03-02 NOTE — Patient Instructions (Addendum)
Warfarin tablets What is this medicine? WARFARIN (WAR far in) is an anticoagulant. It is used to treat or prevent clots in the veins, arteries, lungs, or heart. This medicine may be used for other purposes; ask your health care provider or pharmacist if you have questions. What should I tell my health care provider before I take this medicine? They need to know if you have any of these conditions: -alcoholism -anemia -bleeding disorders -cancer -diabetes -heart disease -high blood pressure -history of bleeding in the gastrointestinal tract -history of stroke or other brain injury or disease -kidney or liver disease -protein C deficiency -protein S deficiency -psychosis or dementia -recent injury, recent or planned surgery or procedure -an unusual or allergic reaction to warfarin, other medicines, foods, dyes, or preservatives -pregnant or trying to get pregnant -breast-feeding How should I use this medicine? Take this medicine by mouth with a glass of water. Follow the directions on the prescription label. You can take this medicine with or without food. Take your medicine at the same time each day. Do not take it more often than directed. Do not stop taking except on the advice of your doctor or health care professional. A special MedGuide will be given to you by the pharmacist with each prescription and refill. Be sure to read this information carefully each time. Talk to your pediatrician regarding the use of this medicine in children. Special care may be needed. Overdosage: If you think you have taken too much of this medicine contact a poison control center or emergency room at once. NOTE: This medicine is only for you. Do not share this medicine with others. What if I miss a dose? It is important not to miss a dose. If you miss a dose, call your healthcare provider. Take the dose as soon as possible on the same day. If it is almost time for your next dose, take only that dose. Do  not take double or extra doses to make up for a missed dose. What may interact with this medicine? Do not take this medicine with any of the following medications: -agents that prevent or dissolve blood clots -aspirin or other salicylates -danshen -dextrothyroxine -mifepristone -St. John's Wort -red yeast rice This medicine may also interact with the following medications: -acetaminophen -agents that lower cholesterol -alcohol -allopurinol -amiodarone -antibiotics or medicines for treating bacterial, fungal or viral infections -azathioprine -barbiturate medicines for inducing sleep or treating seizures -certain medicines for diabetes -certain medicines for heart rhythm problems -certain medicines for high blood pressure -chloral hydrate -cisapride -disulfiram -female hormones, including contraceptive or birth control pills -general anesthetics -herbal or dietary products like cranberry, garlic, ginkgo, ginseng, green tea, or kava kava -influenza virus vaccine -female hormones -medicines for mental depression or psychosis -medicines for some types of cancer -medicines for stomach problems -methylphenidate -NSAIDs, medicines for pain and inflammation, like ibuprofen or naproxen -propoxyphene -quinidine, quinine -raloxifene -seizure or epilepsy medicine like carbamazepine, phenytoin, and valproic acid -steroids like cortisone and prednisone -tamoxifen -thyroid medicine -tramadol -vitamin c, vitamin e, and vitamin K -zafirlukast -zileuton This list may not describe all possible interactions. Give your health care provider a list of all the medicines, herbs, non-prescription drugs, or dietary supplements you use. Also tell them if you smoke, drink alcohol, or use illegal drugs. Some items may interact with your medicine. What should I watch for while using this medicine? Visit your doctor or health care professional for regular checks on your progress. You will need to have  your blood   checked regularly to make sure you are getting the right dose of this medicine. When you first start taking this medicine, these tests are done often. Once the correct dose is determined and you take your medicine properly, these tests can be done less often. While you are taking this medicine, carry an identification card with your name, the name and dose of medicine(s) being used, and the name and phone number of your doctor or health care professional or person to contact in an emergency. Do not start taking or stop taking any medicines or over-the-counter medicines except on the advice of your doctor or health care professional. You should discuss your diet with your doctor or health care professional. Do not make major changes in your diet. Many foods contain high amounts of vitamin K, which can interfere with the effect of this medicine. Your doctor or health care professional may want you to limit your intake of foods that contain vitamin K. Some foods that have moderate to high amounts of vitamin K are green leafy vegetables like beet greens, collard greens, endive, kale, mustard greens, spinach, turnip greens, watercress, and certain lettuces like green leaf or romaine. Some other foods that have high to moderate amounts of vitamin K are asparagus, black eye peas, broccoli, brussels sprouts, cabbage, cucumber with peel, okra, peas, parsley, and green onions. This medicine can cause birth defects or bleeding in an unborn child. Women of childbearing age should use effective birth control while taking this medicine. If a woman becomes pregnant while taking this medicine, she should discuss the potential risks and her options with her health care professional. Avoid sports and activities that might cause injury while you are using this medicine. Severe falls or injuries can cause unseen bleeding. Be careful when using sharp tools or knives. Consider using an Neurosurgeon. Take special care  brushing or flossing your teeth. Report any injuries, bruising, or red spots on the skin to your doctor or health care professional. If you have an illness that causes vomiting, diarrhea, or fever for more than a few days, contact your doctor. Also check with your doctor if you are unable to eat for several days. These problems can change the effect of this medicine. Even after you stop taking this medicine, it takes several days before your body recovers its normal ability to clot blood. Ask your doctor or health care professional how long you need to be careful. If you are going to have surgery or dental work, tell your doctor or health care professional that you have been taking this medicine. What side effects may I notice from receiving this medicine? Side effects that you should report to your doctor or health care professional as soon as possible: -back or stomach pain -chest pain or fast or irregular heartbeat -difficulty breathing or talking, wheezing -dizziness -fever or chills -headaches -heavy menstrual bleeding or vaginal bleeding -nausea, vomiting -painful, blue, or purple toes -painful, prolonged erection -signs and symptoms of bleeding such as bloody or black, tarry stools, red or dark-brown urine, spitting up blood or brown material that looks like coffee grounds, red spots on the skin, unusual bruising or bleeding from the eye, gums, or nose -skin rash, itching or skin damage -unusual swelling or sudden weight gain -unusually weak or tired -yellowing of skin or eyes Side effects that usually do not require medical attention (report to your doctor or health care professional if they continue or are bothersome): -diarrhea -unusual hair loss This list may not  describe all possible side effects. Call your doctor for medical advice about side effects. You may report side effects to FDA at 1-800-FDA-1088. Where should I keep my medicine? Keep out of the reach of children. Store  at room temperature between 15 and 30 degrees C (59 and 86 degrees F). Protect from light. Throw away any unused medicine after the expiration date. NOTE: This sheet is a summary. It may not cover all possible information. If you have questions about this medicine, talk to your doctor, pharmacist, or health care provider.  2013, [employer]vier/[employer] Standard. (04/24/2011 12:08:27 PM)

## 2013-03-02 NOTE — Progress Notes (Signed)
[facility] OFFICE PROGRESS NOTE  Garlan Fillers, MD [address] [facility], [place]. [place] [address]  DIAGNOSIS: DVT (deep venous thrombosis), left - Plan: CBC with Differential, Comprehensive metabolic panel  Pulmonary embolism - Plan: CBC with Differential, Comprehensive metabolic panel  Chief Complaint  Patient presents with  . pulmonary embolism    CURRENT THERAPY: Warfarin 7.5 mg daily  INTERVAL HISTORY: [name] 72 y.o. female with a history of a hypercoagulable state complicated by DVT/PE who reports for followup.  She was last seen by Dr. Dalene Carrow on 12.06/13.   Today, she is accompanied by her husband Nadine Counts. Her xarelto was changed to warfarin about 8 months ago.  She mild nausea when taking her coumadin but otherwise reports that she is tolerating therapy without difficulties. She denies any recent hospitalizations or emergency room visits.  She continues her CPAP with supplemental oxygen at night.  She had a mammogram this past week.   MEDICAL HISTORY: Past Medical History  Diagnosis Date  . Hypertension   . PE (pulmonary embolism)   . Thyroid disease     thyroid cancer  . Cancer     thyroid  . Unspecified deficiency anemia     INTERIM HISTORY: has Pulmonary embolism; DVT (deep venous thrombosis); Cough; OSA (obstructive sleep apnea); and Adenopathy on her problem list.    ALLERGIES:  is allergic to codeine and latex.  MEDICATIONS: has a current medication list which includes the following prescription(s): diltiazem, fluticasone, irbesartan, levothyroxine, lorazepam, and warfarin.  SURGICAL HISTORY:  Past Surgical History  Procedure Laterality Date  . Appendectomy    . Thyroid surgery    . Liver biopsy    . Abdominal hysterectomy    . Breast biopsy      REVIEW OF SYSTEMS:   Constitutional: Denies fevers, chills or abnormal weight loss Eyes: Denies blurriness of vision Ears, nose, mouth, throat, and face:  Denies mucositis or sore throat Respiratory: Denies cough, dyspnea or wheezes Cardiovascular: Denies palpitation, chest discomfort or lower extremity swelling Gastrointestinal:  Denies nausea, heartburn or change in bowel habits Skin: Denies abnormal skin rashes Lymphatics: Denies new lymphadenopathy or easy bruising Neurological:Denies numbness, tingling or new weaknesses Behavioral/Psych: Mood is stable, no new changes  All other systems were reviewed with the patient and are negative.  PHYSICAL EXAMINATION: ECOG PERFORMANCE STATUS: 0 - Asymptomatic  Blood pressure 150/78, pulse 69, temperature 97 F (36.1 C), temperature source Oral, resp. rate 20, height 5' 2.75" (1.594 m), weight 225 lb (102.059 kg).  GENERAL:alert, no distress and comfortable elderly lady who is mildly obese SKIN: skin color, texture, turgor are normal, no rashes or significant lesions EYES: normal, Conjunctiva are pink and non-injected, sclera clear OROPHARYNX:no exudate, no erythema and lips, buccal mucosa, and tongue normal  NECK: supple, thyroid normal size, non-tender, without nodularity LYMPH:  no palpable lymphadenopathy in the cervical, supraclavicular LUNGS: clear to auscultation and percussion with normal breathing effort HEART: regular rate & rhythm and no murmurs and no lower extremity edema ABDOMEN:abdomen soft, non-tender and normal bowel sounds Musculoskeletal:no cyanosis of digits and no clubbing  NEURO: alert & oriented x 3 with fluent speech, no focal motor/sensory deficits   LABORATORY DATA: Results for orders placed in visit on 03/02/13 (from the past 48 hour(s))  CBC WITH DIFFERENTIAL     Status: None   Collection Time    03/02/13  8:58 AM      Result Value Range   WBC 7.1  3.9 - 10.3  10e3/uL   NEUT# 4.2  1.5 - 6.5 10e3/uL   HGB 14.9  11.6 - 15.9 g/dL   HCT 96.0  45.4 - 09.8 %   Platelets 232  145 - 400 10e3/uL   MCV 89.1  79.5 - 101.0 fL   MCH 29.9  25.1 - 34.0 pg   MCHC 33.6   31.5 - 36.0 g/dL   RBC 1.19  1.47 - 8.29 10e6/uL   RDW 13.9  11.2 - 14.5 %   lymph# 1.9  0.9 - 3.3 10e3/uL   MONO# 0.9  0.1 - 0.9 10e3/uL   Eosinophils Absolute 0.1  0.0 - 0.5 10e3/uL   Basophils Absolute 0.0  0.0 - 0.1 10e3/uL   NEUT% 59.6  38.4 - 76.8 %   LYMPH% 26.6  14.0 - 49.7 %   MONO% 12.3  0.0 - 14.0 %   EOS% 1.1  0.0 - 7.0 %   BASO% 0.4  0.0 - 2.0 %    CMP     Component Value Date/Time   NA 141 04/29/2012 1145   NA 137 03/07/2012 1015   K 4.2 04/29/2012 1145   K 4.0 03/07/2012 1015   CL 104 04/29/2012 1145   CL 102 03/07/2012 1015   CO2 30* 04/29/2012 1145   CO2 25 03/07/2012 1015   GLUCOSE 93 04/29/2012 1145   GLUCOSE 105* 03/07/2012 1015   BUN 13.0 04/29/2012 1145   BUN 14 03/07/2012 1015   CREATININE 0.8 04/29/2012 1145   CREATININE 0.89 03/07/2012 1015   CALCIUM 9.8 04/29/2012 1145   CALCIUM 9.1 03/07/2012 1015   PROT 7.3 04/29/2012 1145   PROT 8.1 03/16/2011 1819   ALBUMIN 3.6 04/29/2012 1145   ALBUMIN 3.9 03/16/2011 1819   AST 18 04/29/2012 1145   AST 21 03/16/2011 1819   ALT 21 04/29/2012 1145   ALT 19 03/16/2011 1819   ALKPHOS 126 04/29/2012 1145   ALKPHOS 113 03/16/2011 1819   BILITOT 0.55 04/29/2012 1145   BILITOT 0.3 03/16/2011 1819   GFRNONAA 63* 03/07/2012 1015   GFRAA 73* 03/07/2012 1015      RADIOGRAPHIC STUDIES: Mm Digital Screening  02/28/2013   *RADIOLOGY REPORT*  Clinical data: Screening.  DIGITAL SCREENING BILATERAL MAMMOGRAM WITH CAD  DIGITAL BREAST TOMOSYNTHESIS  Digital breast tomosynthesis images are acquired in two projections.  These images are reviewed in combination with the digital mammogram, confirming the findings below.  Comparison: 12/18/2011, 11/27/2010 from the [facility].  11/01/2009, 10/25/2008 from [facility].  FINDINGS:  ACR Breast Density Category b:  There are scattered areas of fibroglandular density.  There is a possible mass in the right breast.  The left breast is unremarkable.  Images were  processed with CAD.  IMPRESSION: Possible mass, right breast.  RECOMMENDATION: Ultrasound of the right breast. (Code:US-R-41M)  The patient will be contacted regarding the findings, and additional imaging will be scheduled.  BI-RADS CATEGORY 0:  Incomplete.  Need additional imaging evaluation and/or prior mammograms for comparison.   Original Report Authenticated By: Cain Saupe, M.D.    ASSESSMENT:  Hypercoagulable state complicated by DVT/PE (2008); Possible mass in the right breast.    PLAN:  She has a history of pulmonary embolism and deep vein thrombosis diagnosed in 2008 and received 6 months of anticoagulation. She also has history of mildly elevated IgM  cardiolipin antibodies. My recommendation is that she continue  indefinite anticoagulation.   She will require an ultrasound of right breast for followup to her mammogram.  Patient has been advised to follow this up. RTC in one year with basic labs as well.   All questions were answered. The patient knows to call the clinic with any problems, questions or concerns. We can certainly see the patient much sooner if necessary.  I spent 15 minutes counseling the patient face to face. The total time spent in the appointment was 30 minutes.    Khiana Camino, MD 03/03/2013 5:14 AM

## 2013-03-02 NOTE — Telephone Encounter (Signed)
gv and printed appt sched and avs for pt for June 2015  °

## 2013-03-09 ENCOUNTER — Ambulatory Visit
Admission: RE | Admit: 2013-03-09 | Discharge: 2013-03-09 | Disposition: A | Discharge status: Home / Self Care | Payer: Medicare Other | Source: Ambulatory Visit | Attending: Internal Medicine | Admitting: Internal Medicine

## 2013-03-09 DIAGNOSIS — R928 Other abnormal and inconclusive findings on diagnostic imaging of breast: Secondary | ICD-10-CM

## 2013-05-19 ENCOUNTER — Ambulatory Visit (INDEPENDENT_AMBULATORY_CARE_PROVIDER_SITE_OTHER): Payer: Medicare Other | Admitting: Neurology

## 2013-05-19 ENCOUNTER — Encounter: Payer: Self-pay | Admitting: Neurology

## 2013-05-19 ENCOUNTER — Encounter (INDEPENDENT_AMBULATORY_CARE_PROVIDER_SITE_OTHER): Payer: Self-pay

## 2013-05-19 VITALS — BP 160/84 | HR 77 | Resp 17 | Ht 63.5 in | Wt 231.0 lb

## 2013-05-19 DIAGNOSIS — G4734 Idiopathic sleep related nonobstructive alveolar hypoventilation: Secondary | ICD-10-CM | POA: Insufficient documentation

## 2013-05-19 DIAGNOSIS — E669 Obesity, unspecified: Secondary | ICD-10-CM | POA: Insufficient documentation

## 2013-05-19 DIAGNOSIS — R0902 Hypoxemia: Secondary | ICD-10-CM

## 2013-05-19 DIAGNOSIS — G4733 Obstructive sleep apnea (adult) (pediatric): Secondary | ICD-10-CM | POA: Insufficient documentation

## 2013-05-19 NOTE — Progress Notes (Signed)
[facility]  Provider:  Melvyn Novas, M D  Referring Provider: Jarome Matin, MD Primary Care Physician:  Garlan Fillers, MD  Chief Complaint  Patient presents with  . Follow-up    C-pap    HPI:  [name] is a 72 y.o. female  Is seen here as a referral/ revisit  from Dr. [name] for  compliance on CPAP therapy.  The patient's primary for Physician had performed an overnight pulse oximetry which indicated tachybradycardia arrhythmia and hypoxemia the patient had been placed on 2 L of oxygen at night before any sleep specific evaluations took place.  As indicated the patient had been diagnosed on 05-08-12 in a polysomnography with a rather severe obstructive apnea,  actually a mixed type of apnea . Her  AHI was 46,  her RDI 53,  her nadir for oxygen 75%.  The 102 minutes of oxygen  desaturations were impressive . The patient was titrated to 9 cm water CPAP which resolved the AHI she actually needed an additional 3 L of oxygen the night 2 would maintain levels at 89% saturation and above. She uses a Respironics mask a so-called more support was medium-size pills humidity. We had multiple followup visits since the initial sleep study. The patient's compliance has been excellent but recently she had started on Vytorin and medication she seems to have trouble to tolerate. Interpreted cells and 14 her Epworth score was 7 points - same as of November 2013 after starting the CPAP therapy.  Her fatigue severity score in the last visit was 25 points. She continued on 3 L of oxygen with CPAP. She has been she had been started on the right or as an anticoagulant in 2013 briefly and her hematologist  suspected that the patient has sarcoidosis.  An in -office download today shows an average daily time and CPAP therapy of 6 hours 51 minutes 100% compliance by days and use her hours. Residual AHI of 0.6 that pressure still 9 cm. EPR level is not longer present. Her fatigue  severity questionnaire was onset at 21 points are Epworth Sleepiness Scale at 7 points.  The patient and her husband have very and change sleep habits, innominate waterbed before 11 PM and as early as 9:30. She falls asleep rather promptly less than 10 minutes, she has to go up to have a bathroom break about once at night if any sense using CPAP. She sleeps until 6:30 AM and wakes spontaneously without the use of an alarm clock. She will have a cup of coffee in the morning she may drink he coffee at its root the afternoon. She does not drink caffeinated beverages later than that the than the nighttime. This  Patient is a lifelong  Nonsmoker,  non-alcohol user- there have never been any recreational drugs. She is very visit colorful dreams but she has not acted out on the dreams, usually able to  recall them.  Her husband, himself a sound sleeper, does not know of any REM behavior, sleep walking or sleep talking.   She has a childhood history of sleep walking .      Review of Systems: Out of a complete 14 system review, the patient complains of only the following symptoms, and all other reviewed systems are negative. Joint pain, weight gain,  Back pain, drooling , palpitations, leg edema,  Runny nose, cough, chills,  Anxiety.   History   Social History  . Marital Status: Married    Spouse Name: N/A  Number of Children: N/A  . Years of Education: N/A   Occupational History  . Retired    Social History Main Topics  . Smoking status: Never Smoker   . Smokeless tobacco: Never Used  . Alcohol Use: No  . Drug Use: No  . Sexual Activity: Not on file   Other Topics Concern  . Not on file   Social History Narrative  . No narrative on file    History reviewed. No pertinent family history.  Past Medical History  Diagnosis Date  . Hypertension   . PE (pulmonary embolism)   . Thyroid disease     thyroid cancer  . Cancer     thyroid  . Unspecified deficiency anemia   . OSA on  CPAP     05-08-12 AHi was 46, RDI  53. titrated to   9 cm water  with 3 cm flex.-nadir 75%    Past Surgical History  Procedure Laterality Date  . Appendectomy    . Thyroid surgery    . Liver biopsy    . Abdominal hysterectomy    . Breast biopsy      Current Outpatient Prescriptions  Medication Sig Dispense Refill  . diltiazem (CARDIZEM CD) 360 MG 24 hr capsule Take 360 mg by mouth daily.        . fluticasone (FLONASE) 50 MCG/ACT nasal spray Place 1 spray into the nose daily. For allergies.      Marland Kitchen irbesartan (AVAPRO) 300 MG tablet Take 150 mg by mouth at bedtime.       Marland Kitchen levothyroxine (SYNTHROID, LEVOTHROID) 112 MCG tablet Take 112 mcg by mouth daily.      Marland Kitchen LORazepam (ATIVAN) 1 MG tablet Take 0.5 mg by mouth as needed.       Marland Kitchen TAZTIA XT 360 MG 24 hr capsule       . warfarin (COUMADIN) 7.5 MG tablet Take 7.5 mg by mouth daily.       No current facility-administered medications for this visit.    Allergies as of 05/19/2013 - Review Complete 05/19/2013  Allergen Reaction Noted  . Codeine Other (See Comments) 03/16/2011  . Latex Rash 03/07/2012    Vitals: BP 160/84  Pulse 77  Resp 17  Ht 5' 3.5" (1.613 m)  Wt 231 lb (104.781 kg)  BMI 40.27 kg/m2 Last Weight:  Wt Readings from Last 1 Encounters:  05/19/13 231 lb (104.781 kg)   Last Height:   Ht Readings from Last 1 Encounters:  05/19/13 5' 3.5" (1.613 m)    Physical exam:  General: The patient is awake, alert and appears not in acute distress. The patient is well groomed. Head: Normocephalic, atraumatic. Neck is supple. Mallampati 2  neck circumference:16 . No retrognathia . Cardiovascular: irregular rate and rhythm- palpitations,  without  murmurs or carotid bruit, and without distended neck veins. Respiratory: Lungs are clear to auscultation. Skin:  Without evidence of edema, or rash Trunk: BMI is elevated . The  patient  has normal posture.  Neurologic exam : The patient is awake and alert, oriented to place  and time.  Memory subjective  described as intact. There is a normal attention span & concentration ability.  Speech is fluent without  dysarthria, dysphonia or aphasia. Mood and affect are appropriate.  Cranial nerves: Pupils are equal and briskly reactive to light. Funduscopic exam without   evidence of pallor or edema. Extraocular movements  in vertical and horizontal planes intact and without nystagmus. Visual fields by finger perimetry are  intact. Hearing to finger rub intact.  Facial sensation intact to fine touch. Facial motor strength is symmetric and tongue and uvula move midline.  Motor exam:   Normal tone and normal muscle bulk and symmetric normal strength in all extremities.  Sensory:  Fine touch, pinprick and vibration were tested in all extremities.  Proprioception is tested in the upper extremities only. This was   normal.  Coordination: Rapid alternating movements in the fingers/hands is tested and normal.  Finger-to-nose maneuver tested and normal without evidence of ataxia, dysmetria or tremor.  Gait and station: Patient walks without assistive device .    Assessment:  After physical and neurologic examination, review of laboratory studies, imaging, neurophysiology testing and pre-existing records, assessment is ; 1) OSA /  CSA on CPAP with good resolution of apnea , additional oxygen at 3 liters helped to improve her nadir . 2) sarcoidosis ' diagnosed in 1972, undiagnosed in 1995 and now again discussed'.  3) arrhythmia. 4) weight loss    Plan:  Treatment plan and additional workup : Continue CPAP at current level of pressure. continue oxygen  3 liters.   BMI is her main problem - she would like to look up a nutritional approach , DASH diet discussed.  DME : Patient is not happy with [employer]. Can she change to a more attentive provider?

## 2013-05-19 NOTE — Patient Instructions (Signed)
Oxygen Use at Home Oxygen can be prescribed for home use. The prescription will show the flow rate. This is how much oxygen is to be used per minute. This will be listed in liters per minute (LPM or L/M). A liter is a metric measurement of volume. You will use oxygen therapy as directed. It can be used while exercising, sleeping, or at rest. You may need oxygen continuously. Your caregiver may order a blood oxygen test (arterial blood gas or pulse oximetry test) that will show what your oxygen level is. Your caregiver will use these measurements to learn about your needs and follow your progress. Home oxygen therapy is commonly used on patients with various lung (pulmonary) related conditions. Some of these conditions include:  Asthma.  Lung cancer.  Pneumonia.  Emphysema.  Chronic bronchitis.  Cystic fibrosis.  Other lung diseases.  Pulmonary fibrosis.  Occupational lung disease.  Heart failure.  Chronic obstructive pulmonary disease (COPD). 3 COMMON WAYS OF PROVIDING OXYGEN THERAPY  Gas: The gas form of oxygen is put into variously sized cylinders or tanks. The cylinders or oxygen tanks contain compressed oxygen. The cylinder is equipped with a regulator that controls the flow rate. Because the flow of oxygen out of the cylinder is constant, an oxygen conserving device may be attached to the system to avoid waste. This device releases the gas only when you inhale and cuts it off when you exhale. Oxygen can be provided in a small cylinder that can be carried with you. Large tanks are heavy and are only for stationary use. After use, empty tanks must be exchanged for full tanks.  Liquid: The liquid form of oxygen is put into a container similar to a thermos. When released, the liquid converts to a gas and you breathe it in just like the compressed gas. This storage method takes up less space than the compressed gas cylinder, and you can transfer the liquid to a small, portable vessel at  home. Liquid oxygen is more expensive than the compressed gas, and the vessel vents when not in use. An oxygen conserving device may be built into the vessel to conserve the oxygen. Liquid oxygen is very cold, around 297 below zero.  Oxygen concentrator: This medical device filters oxygen from room air and gives almost 100% oxygen to the patient. Oxygen concentrators are powered by electricity. Benefits of this system are:  It does not need to be resupplied.  It is not as costly as liquid oxygen.  Extra tubing permits the user to move around easier. There are several types of small, portable oxygen systems available which can help you remain active and mobile. You must have a cylinder of oxygen as a backup in the event of a power failure. Advise your electric power company that you are on oxygen therapy in order to get priority service when there is a power failure. OXYGEN DELIVERY DEVICES There are 3 common ways to deliver oxygen to your body.  Nasal cannula. This is a 2-pronged device inserted in the nostrils that is connected to tubing carrying the oxygen. The tubing can rest on the ears or be attached to the frame of eyeglasses.  Mask. People who need a high flow of oxygen generally use a mask.  Transtracheal catheter. Transtracheal oxygen therapy requires the insertion of a small, flexible tube (catheter) in the windpipe (trachea). This catheter is held in place by a necklace. Since transtracheal oxygen bypasses the mouth, nose, and throat, a humidifier is absolutely required at flow  rates of 1 LPM or greater. SAFETY ISSUES  Never smoke while using oxygen.  Oxygen does not burn or explode, but materials will burn faster in the presence of oxygen.  Keep a Government social research officer close by. Let your fire department know that you have oxygen in your home.  Warn visitors not to smoke near you when you are using oxygen. Put up "no smoking" signs in your home where you most often use the  oxygen.  When you go to a restaurant with your portable oxygen source, ask to be seated in the nonsmoking section.  Stay at least 5 feet away from gas stoves, candles, lighted fireplaces, or other heat sources.  Do not use materials that burn easily (flammable) while using your oxygen.  If you use an oxygen cylinder, make sure it is secured to some fixed object or in a stand. If you use liquid oxygen, make sure the vessel is kept upright to keep the oxygen from pouring out. Liquid oxygen is so cold it can hurt your skin.  If you use an oxygen concentrator, call your electric company so you will be given priority if there is a power failure. Avoid using extension cords, if possible. GUIDELINES FOR CLEANING YOUR EQUIPMENT  Wash the nasal prongs with a liquid soap. Thoroughly rinse them once or twice a week.  Replace the prongs every 2 to 4 weeks. If you have an infection (cold, pneumonia) change them when you are well.  Your caregiver will give you instructions on how to clean your transtracheal catheter.  The humidifier bottle should be washed with soap and warm water and rinsed thoroughly between each refill. Air-dry the bottle before filling it with sterile or distilled water. The bottle and its top should be disinfected after they are cleaned.  If you use an oxygen concentrator, unplug the unit. Then wipe down the cabinet with a damp cloth and dry it daily. The air filter should be cleaned at least twice a week.  Follow your home medical equipment and service company's directions for cleaning the compressor filter. HOME CARE INSTRUCTIONS   Do not change the flow of oxygen unless directed by your caregiver.  Do not use alcohol or other sedating drugs unless instructed. They slow your breathing rate.  Do not use materials that burn easily (flammable) while using your oxygen.  Always keep a spare tank of oxygen. Plan ahead for holidays when you may not be able to get a prescription  filled.  Use water-based lubricants on your lips or nostrils. Do not use an oil-based product like petroleum jelly.  To prevent your cheeks or the skin behind your ears from becoming irritated, tuck some gauze under the tubing.  If you have persistent redness under your nose, call your caregiver.  When you no longer need oxygen, your doctor will have the oxygen discontinued. Oxygen is not addicting or habit-forming.  Use the oxygen as instructed. Too much oxygen can be harmful and too little will not give you the benefit you need.  Shortness of breath is not always from a lack of oxygen. If your oxygen level is not the cause of your shortness of breath, taking oxygen will not help. SEEK MEDICAL CARE IF:   You have frequent headaches.  You have shortness of breath or a lasting cough.  You have anxiety.  You are confused.  You are drowsy or sleepy all the time.  You develop an illness which aggravates your breathing.  You cannot exercise.  You  are restless.  You have blue lips or fingernails.  You have difficult or irregular breathing and it is getting worse.  You have an oral temperature above 102 F (38.9 C). Document Released: 08/16/2003 Document Revised: 08/18/2011 Document Reviewed: 04/22/2007 [facility] Patient Information 2014 [place], [place].

## 2013-05-31 ENCOUNTER — Encounter: Payer: Self-pay | Admitting: Neurology

## 2013-06-16 ENCOUNTER — Encounter (INDEPENDENT_AMBULATORY_CARE_PROVIDER_SITE_OTHER): Payer: Medicare Other | Admitting: Ophthalmology

## 2013-06-16 DIAGNOSIS — H33309 Unspecified retinal break, unspecified eye: Secondary | ICD-10-CM

## 2013-06-16 DIAGNOSIS — H35379 Puckering of macula, unspecified eye: Secondary | ICD-10-CM

## 2013-06-16 DIAGNOSIS — I10 Essential (primary) hypertension: Secondary | ICD-10-CM

## 2013-06-16 DIAGNOSIS — H43819 Vitreous degeneration, unspecified eye: Secondary | ICD-10-CM

## 2013-06-16 DIAGNOSIS — H35039 Hypertensive retinopathy, unspecified eye: Secondary | ICD-10-CM

## 2013-06-16 DIAGNOSIS — H3530 Unspecified macular degeneration: Secondary | ICD-10-CM

## 2013-06-16 DIAGNOSIS — H2510 Age-related nuclear cataract, unspecified eye: Secondary | ICD-10-CM

## 2013-06-30 ENCOUNTER — Ambulatory Visit (INDEPENDENT_AMBULATORY_CARE_PROVIDER_SITE_OTHER): Payer: Medicare Other | Admitting: Ophthalmology

## 2013-06-30 DIAGNOSIS — H33309 Unspecified retinal break, unspecified eye: Secondary | ICD-10-CM

## 2013-07-07 ENCOUNTER — Other Ambulatory Visit: Payer: Self-pay | Admitting: Dermatology

## 2013-08-08 ENCOUNTER — Other Ambulatory Visit: Payer: Self-pay | Admitting: Internal Medicine

## 2013-08-08 DIAGNOSIS — N6310 Unspecified lump in the right breast, unspecified quadrant: Secondary | ICD-10-CM

## 2013-08-12 ENCOUNTER — Encounter: Payer: Self-pay | Admitting: *Deleted

## 2013-08-12 ENCOUNTER — Encounter: Payer: Self-pay | Admitting: Interventional Cardiology

## 2013-09-08 ENCOUNTER — Other Ambulatory Visit: Payer: Self-pay | Admitting: Internal Medicine

## 2013-09-08 ENCOUNTER — Ambulatory Visit
Admission: RE | Admit: 2013-09-08 | Discharge: 2013-09-08 | Disposition: A | Discharge status: Home / Self Care | Payer: Medicare Other | Source: Ambulatory Visit | Attending: Internal Medicine | Admitting: Internal Medicine

## 2013-09-08 DIAGNOSIS — N6310 Unspecified lump in the right breast, unspecified quadrant: Secondary | ICD-10-CM

## 2013-09-16 ENCOUNTER — Encounter: Payer: Self-pay | Admitting: Interventional Cardiology

## 2013-09-16 ENCOUNTER — Ambulatory Visit: Payer: Medicare Other | Admitting: Interventional Cardiology

## 2013-09-22 ENCOUNTER — Other Ambulatory Visit: Payer: Self-pay | Admitting: Internal Medicine

## 2013-09-22 DIAGNOSIS — R1011 Right upper quadrant pain: Secondary | ICD-10-CM

## 2013-09-27 ENCOUNTER — Other Ambulatory Visit: Payer: Medicare Other

## 2013-09-29 ENCOUNTER — Ambulatory Visit
Admission: RE | Admit: 2013-09-29 | Discharge: 2013-09-29 | Disposition: A | Discharge status: Home / Self Care | Payer: Medicare Other | Source: Ambulatory Visit | Attending: Internal Medicine | Admitting: Internal Medicine

## 2013-09-29 DIAGNOSIS — R1011 Right upper quadrant pain: Secondary | ICD-10-CM

## 2013-10-07 ENCOUNTER — Telehealth: Payer: Self-pay

## 2013-10-07 ENCOUNTER — Other Ambulatory Visit: Payer: Self-pay | Admitting: General Surgery

## 2013-10-07 MED ORDER — IRBESARTAN 300 MG PO TABS: 150.0000 mg | ORAL_TABLET | Freq: Every day | ORAL | Status: DC

## 2013-10-07 NOTE — Telephone Encounter (Signed)
Gave 30 day supply pt is Due to see Dr Clayton Bibles May 7th to amy to make aware.

## 2013-10-13 ENCOUNTER — Ambulatory Visit (INDEPENDENT_AMBULATORY_CARE_PROVIDER_SITE_OTHER): Payer: Medicare Other | Admitting: Interventional Cardiology

## 2013-10-13 ENCOUNTER — Encounter: Payer: Self-pay | Admitting: Interventional Cardiology

## 2013-10-13 ENCOUNTER — Telehealth: Payer: Self-pay | Admitting: Pharmacist

## 2013-10-13 ENCOUNTER — Encounter: Payer: Self-pay | Admitting: Cardiology

## 2013-10-13 ENCOUNTER — Telehealth: Payer: Self-pay | Admitting: Cardiology

## 2013-10-13 VITALS — BP 158/78 | HR 61 | Ht 63.5 in | Wt 225.0 lb

## 2013-10-13 DIAGNOSIS — E782 Mixed hyperlipidemia: Secondary | ICD-10-CM

## 2013-10-13 DIAGNOSIS — E669 Obesity, unspecified: Secondary | ICD-10-CM

## 2013-10-13 DIAGNOSIS — I10 Essential (primary) hypertension: Secondary | ICD-10-CM | POA: Insufficient documentation

## 2013-10-13 DIAGNOSIS — I2699 Other pulmonary embolism without acute cor pulmonale: Secondary | ICD-10-CM

## 2013-10-13 NOTE — Telephone Encounter (Signed)
Patient saw Dr. Irish Lack today and was interested in hearing more about the PCSK-9 study.  She has an LDL of 213 mg/dL (Dr. Sharlett Iles), and intolerant to Lipitor, Zocor, and Crestor.  No CAD or h/o CVA, however she has a h/o DVT / PE.  She had a h/o thyroid cancer in 1970, and no relapse since.  Patient would like to be called in 3-4 months once the PCSK-9 agents are on the market and consider trying one at that time.  I will call her at that time.

## 2013-10-13 NOTE — Telephone Encounter (Signed)
Closing encounter

## 2013-10-13 NOTE — Patient Instructions (Signed)
Your physician recommends that you continue on your current medications as directed. Please refer to the Current Medication list given to you today.  Your physician wants you to follow-up in: 1 year with Dr. Varanasi. You will receive a reminder letter in the mail two months in advance. If you don't receive a letter, please call our office to schedule the follow-up appointment.  

## 2013-10-13 NOTE — Telephone Encounter (Signed)
Patient has tried generic losartan and irbesartan.  Her BP has not been well controlled.  She has felt poorly on both medicines.  She felt better on brand name Avapro 300 mg daily.  Her BP was better controlled.  I request that she be given authorization to resume brand name Avapro.  Casandra Doffing, MD  Maudie Mercury the above is what Dr. Irish Lack wrote last year regarding Avapro. Pt needs a non-formulary drug request for her insurance to approve brand name avapro. Last year we had to fill out a form for this. They would note let us send letter. Can you help me get the form?

## 2013-10-13 NOTE — Progress Notes (Signed)
Patient ID: [name], female   DOB: [birth year], 73 y.o.   MRN: [id number]    [name], [name], [name]  [address] Phone: [phone] Fax:  [phone]  Date:  10/13/2013   ID:  [name], DOB [birth year], MRN [id number]  PCP:  Linda Lopes, MD      History of Present Illness: [name] is a 73 y.o. female with a h/o of DVT/PE. SHe was treated with Coumadin for 6 months. No reason found for DVTs. She had a stress test in 4/09 which was negative for ischemia. She exercised for 6:15 seconds. She had IBS but in 4/10, had persistent pain and had blood in her stool while off coumadin, she was diagnosed with ischemic colitis. Later, she states she was diagnosed with polycythemia, which was being managed by [name]. She was also placed on Xarelto, after a CT scan but then switched back to Coumadin.;.  BP has been well controlled.  It was in the 440-347 range systolic for the most part.  i reviewed her readings today. Hypertension:  felt poorly off of brand name Avapro. Has tried losartan and irbesartan. BP was not well controlled  off of Avapro. She reports a cough. c/o Leg edema more at the end of the day.  Denies : Orthopnea.  Paroxysmal nocturnal dyspnea.  Palpitations.  Syncope.  controlled at home. No chest pain with exertion. Occasional CP with anxiety.  Overall doing well.  Nt exercising as muh as she should per her report.    Wt Readings from Last 3 Encounters:  10/13/13 225 lb (102.059 kg)  05/19/13 231 lb (104.781 kg)  03/02/13 225 lb (102.059 kg)     Past Medical History  Diagnosis Date  . Hypertension   . PE (pulmonary embolism)   . Thyroid disease     thyroid cancer  . Cancer     thyroid  . Unspecified deficiency anemia   . OSA on CPAP     05-08-12 AHi was 46, RDI  53. titrated to   9 cm water  with 3 cm flex.-nadir 75%    Current Outpatient Prescriptions  Medication Sig Dispense Refill  . alendronate  (FOSAMAX) 70 MG tablet Take 70 mg by mouth once a week.       . fluticasone (FLONASE) 50 MCG/ACT nasal spray Place 1 spray into the nose daily. For allergies.      . hydroxypropyl methylcellulose (ISOPTO TEARS) 2.5 % ophthalmic solution 1 drop.      . irbesartan (AVAPRO) 300 MG tablet Take 0.5 tablets (150 mg total) by mouth at bedtime.  15 tablet  0  . levothyroxine (SYNTHROID, LEVOTHROID) 112 MCG tablet Take 112 mcg by mouth daily.      Marland Kitchen LORazepam (ATIVAN) 1 MG tablet Take 0.5 mg by mouth as needed.       . Lutein-Zeaxanthin 25-5 MG CAPS Take by mouth.      [name] (SYSTANE OP) Apply to eye.      Marland Kitchen TAZTIA XT 360 MG 24 hr capsule Take 360 mg by mouth daily.       Marland Kitchen warfarin (COUMADIN) 7.5 MG tablet Take 7.5 mg by mouth daily.       No current facility-administered medications for this visit.    Allergies:    Allergies  Allergen Reactions  . Codeine Other (See Comments)    Causes hyperactivity  . Latex Rash    Social History:  The patient  reports that she has never smoked. She has never used smokeless tobacco. She reports that she does not drink alcohol or use illicit drugs.   Family History:  The patient's family history is not on file.   ROS:  Please see the history of present illness.  No nausea, vomiting.  No fevers, chills.  No focal weakness.  No dysuria.    All other systems reviewed and negative.   PHYSICAL EXAM: VS:  BP 158/78  Pulse 61  Ht 5' 3.5" (1.613 m)  Wt 225 lb (102.059 kg)  BMI 39.23 kg/m2 Well nourished, well developed, in no acute distress HEENT: normal Neck: no JVD, no carotid bruits Cardiac:  normal S1, S2; RRR;  Lungs:  clear to auscultation bilaterally, no wheezing, rhonchi or rales Abd: soft, nontender, no hepatomegaly Ext: no edema Skin: warm and dry Neuro:   no focal abnormalities noted  Linda:  Normal   ASSESSMENT AND PLAN:  1. 1. Hypertension, essential  Continue Linda HCl ER Beads Capsule Extended Release 24  Mclean, 360 MG, 1 capsule, Orally, Once a day BP with variable control. Back on brand name Avapro. BP readings were increased when generic Avapro was used.  2. Mixed hyperlipidemia  Stopped Omega-3 Capsule, 2.59 GRAMS, 1 TABLET, Orally, DAILY.  Off of statin.  She is interested in PCSK-9 inhibitor trial.  TC 308.  She has been intolerant of Crestor, Lipitor, simvastatin.  3. Observation for suspected cardiovascular disease  Diagnostic Imaging:[name] 05/21/2012 09:38:34 AM > [name] 05/21/2012 10:00:32 AM > [name], no ST segment changes   4. SOB    Resolved.  USing CPAP with oxygen at night. Increase exercise to 5 days/week x 30 minutes /day.  5. Obesity: Spoke about increasing exercise to try to help with weight loss.  They avoid red meat.    Signed, Linda Marble, MD, [name] 10/13/2013 11:06 AM

## 2013-10-17 NOTE — Telephone Encounter (Signed)
A non formuary form completed and faxed to [facility] for patient name brand AVAPRO

## 2013-10-18 ENCOUNTER — Other Ambulatory Visit: Payer: Self-pay

## 2013-10-18 MED ORDER — IRBESARTAN 300 MG PO TABS: 150.0000 mg | ORAL_TABLET | Freq: Every day | ORAL | Status: DC

## 2013-10-20 ENCOUNTER — Ambulatory Visit: Payer: Medicare Other | Admitting: Interventional Cardiology

## 2013-11-02 ENCOUNTER — Ambulatory Visit (INDEPENDENT_AMBULATORY_CARE_PROVIDER_SITE_OTHER): Payer: Medicare Other | Admitting: Ophthalmology

## 2013-11-02 DIAGNOSIS — H2510 Age-related nuclear cataract, unspecified eye: Secondary | ICD-10-CM

## 2013-11-02 DIAGNOSIS — H43819 Vitreous degeneration, unspecified eye: Secondary | ICD-10-CM

## 2013-11-02 DIAGNOSIS — H35039 Hypertensive retinopathy, unspecified eye: Secondary | ICD-10-CM

## 2013-11-02 DIAGNOSIS — H3530 Unspecified macular degeneration: Secondary | ICD-10-CM

## 2013-11-02 DIAGNOSIS — H33309 Unspecified retinal break, unspecified eye: Secondary | ICD-10-CM

## 2013-11-02 DIAGNOSIS — I10 Essential (primary) hypertension: Secondary | ICD-10-CM

## 2013-11-30 ENCOUNTER — Other Ambulatory Visit (HOSPITAL_BASED_OUTPATIENT_CLINIC_OR_DEPARTMENT_OTHER): Payer: Medicare Other

## 2013-11-30 ENCOUNTER — Ambulatory Visit (HOSPITAL_BASED_OUTPATIENT_CLINIC_OR_DEPARTMENT_OTHER): Payer: Medicare Other | Admitting: Internal Medicine

## 2013-11-30 ENCOUNTER — Telehealth: Payer: Self-pay | Admitting: Internal Medicine

## 2013-11-30 VITALS — BP 157/73 | HR 65 | Temp 97.6°F | Resp 18 | Ht 63.0 in | Wt 226.0 lb

## 2013-11-30 DIAGNOSIS — I2699 Other pulmonary embolism without acute cor pulmonale: Secondary | ICD-10-CM

## 2013-11-30 DIAGNOSIS — I82402 Acute embolism and thrombosis of unspecified deep veins of left lower extremity: Secondary | ICD-10-CM

## 2013-11-30 DIAGNOSIS — G4733 Obstructive sleep apnea (adult) (pediatric): Secondary | ICD-10-CM

## 2013-11-30 DIAGNOSIS — I82409 Acute embolism and thrombosis of unspecified deep veins of unspecified lower extremity: Secondary | ICD-10-CM

## 2013-11-30 LAB — CBC WITH DIFFERENTIAL/PLATELET
BASO%: 0.5 % (ref 0.0–2.0)
BASOS ABS: 0 10*3/uL (ref 0.0–0.1)
EOS ABS: 0.1 10*3/uL (ref 0.0–0.5)
EOS%: 1.9 % (ref 0.0–7.0)
HEMATOCRIT: 42.6 % (ref 34.8–46.6)
HEMOGLOBIN: 14 g/dL (ref 11.6–15.9)
LYMPH#: 2.1 10*3/uL (ref 0.9–3.3)
LYMPH%: 32.9 % (ref 14.0–49.7)
MCH: 29.5 pg (ref 25.1–34.0)
MCHC: 32.9 g/dL (ref 31.5–36.0)
MCV: 89.7 fL (ref 79.5–101.0)
MONO#: 0.8 10*3/uL (ref 0.1–0.9)
MONO%: 11.8 % (ref 0.0–14.0)
NEUT%: 52.9 % (ref 38.4–76.8)
NEUTROS ABS: 3.4 10*3/uL (ref 1.5–6.5)
Platelets: 216 10*3/uL (ref 145–400)
RBC: 4.75 10*6/uL (ref 3.70–5.45)
RDW: 13.8 % (ref 11.2–14.5)
WBC: 6.4 10*3/uL (ref 3.9–10.3)

## 2013-11-30 LAB — COMPREHENSIVE METABOLIC PANEL (CC13)
ALT: 20 U/L (ref 0–55)
AST: 18 U/L (ref 5–34)
Albumin: 3.4 g/dL — ABNORMAL LOW (ref 3.5–5.0)
Alkaline Phosphatase: 89 U/L (ref 40–150)
Anion Gap: 7 mEq/L (ref 3–11)
BUN: 14.7 mg/dL (ref 7.0–26.0)
CO2: 24 mEq/L (ref 22–29)
Calcium: 8.5 mg/dL (ref 8.4–10.4)
Chloride: 110 mEq/L — ABNORMAL HIGH (ref 98–109)
Creatinine: 0.8 mg/dL (ref 0.6–1.1)
Glucose: 119 mg/dl (ref 70–140)
Potassium: 3.9 mEq/L (ref 3.5–5.1)
Sodium: 141 mEq/L (ref 136–145)
Total Bilirubin: 0.45 mg/dL (ref 0.20–1.20)
Total Protein: 7.2 g/dL (ref 6.4–8.3)

## 2013-11-30 NOTE — Patient Instructions (Signed)
Warfarin tablets  What is this medicine?  WARFARIN (WAR far in) is an anticoagulant. It is used to treat or prevent clots in the veins, arteries, lungs, or heart.  This medicine may be used for other purposes; ask your health care provider or pharmacist if you have questions.  COMMON BRAND NAME(S): Coumadin, Jantoven  What should I tell my health care provider before I take this medicine?  They need to know if you have any of these conditions:  -alcoholism  -anemia  -bleeding disorders  -cancer  -diabetes  -heart disease  -high blood pressure  -history of bleeding in the gastrointestinal tract  -history of stroke or other brain injury or disease  -kidney or liver disease  -protein C deficiency  -protein S deficiency  -psychosis or dementia  -recent injury, recent or planned surgery or procedure  -an unusual or allergic reaction to warfarin, other medicines, foods, dyes, or preservatives  -pregnant or trying to get pregnant  -breast-feeding  How should I use this medicine?  Take this medicine by mouth with a glass of water. Follow the directions on the prescription label. You can take this medicine with or without food. Take your medicine at the same time each day. Do not take it more often than directed. Do not stop taking except on your doctor's advice. Stopping this medicine may increase your risk of a blood clot. Be sure to refill your prescription before you run out of medicine.  If your doctor or healthcare professional calls to change your dose, write down the dose and any other instructions. Always read the dose and instructions back to him or her to make sure you understand them. Tell your doctor or healthcare professional what strength of tablets you have on hand. Ask how many tablets you should take to equal your new dose. Write the date on the new instructions and keep them near your medicine. If you are told to stop taking your medicine until your next blood test, call your doctor or healthcare  professional if you do not hear anything within 24 hours of the test to find out your new dose or when to restart your prior dose.  A special MedGuide will be given to you by the pharmacist with each prescription and refill. Be sure to read this information carefully each time.  Talk to your pediatrician regarding the use of this medicine in children. Special care may be needed.  Overdosage: If you think you have taken too much of this medicine contact a poison control center or emergency room at once.  NOTE: This medicine is only for you. Do not share this medicine with others.  What if I miss a dose?  It is important not to miss a dose. If you miss a dose, call your healthcare provider. Take the dose as soon as possible on the same day. If it is almost time for your next dose, take only that dose. Do not take double or extra doses to make up for a missed dose.  What may interact with this medicine?  Do not take this medicine with any of the following medications:  -agents that prevent or dissolve blood clots  -aspirin or other salicylates  -danshen  -dextrothyroxine  -mifepristone  -St. John's Wort  -red yeast rice  This medicine may also interact with the following medications:  -acetaminophen  -agents that lower cholesterol  -alcohol  -allopurinol  -amiodarone  -antibiotics or medicines for treating bacterial, fungal or viral infections  -azathioprine  -  barbiturate medicines for inducing sleep or treating seizures  -certain medicines for diabetes  -certain medicines for heart rhythm problems  -certain medicines for high blood pressure  -chloral hydrate  -cisapride  -disulfiram  -female hormones, including contraceptive or birth control pills  -general anesthetics  -herbal or dietary products like garlic, ginkgo, ginseng, green tea, or kava kava  -influenza virus vaccine  -female hormones  -medicines for mental depression or psychosis  -medicines for some types of cancer  -medicines for stomach  problems  -methylphenidate  -NSAIDs, medicines for pain and inflammation, like ibuprofen or naproxen  -propoxyphene  -quinidine, quinine  -raloxifene  -seizure or epilepsy medicine like carbamazepine, phenytoin, and valproic acid  -steroids like cortisone and prednisone  -tamoxifen  -thyroid medicine  -tramadol  -vitamin c, vitamin e, and vitamin K  -zafirlukast  -zileuton  This list may not describe all possible interactions. Give your health care provider a list of all the medicines, herbs, non-prescription drugs, or dietary supplements you use. Also tell them if you smoke, drink alcohol, or use illegal drugs. Some items may interact with your medicine.  What should I watch for while using this medicine?  Visit your doctor or health care professional for regular checks on your progress. You will need to have a blood test called a PT/INR regularly. The PT/INR blood test is done to make sure you are getting the right dose of this medicine. It is important to not miss your appointment for the blood tests. When you first start taking this medicine, these tests are done often. Once the correct dose is determined and you take your medicine properly, these tests can be done less often.  Notify your doctor or health care professional and seek emergency treatment if you develop breathing problems; changes in vision; chest pain; severe, sudden headache; pain, swelling, warmth in the leg; trouble speaking; sudden numbness or weakness of the face, arm or leg. These can be signs that your condition has gotten worse.  While you are taking this medicine, carry an identification card with your name, the name and dose of medicine(s) being used, and the name and phone number of your doctor or health care professional or person to contact in an emergency.  Do not start taking or stop taking any medicines or over-the-counter medicines except on the advice of your doctor or health care professional.  You should discuss your diet with  your doctor or health care professional. Do not make major changes in your diet. Vitamin K can affect how well this medicine works. Many foods contain vitamin K. It is important to eat a consistent amount of foods with vitamin K. Other foods with vitamin K that you should eat in consistent amounts are asparagus, basil, beef or pork liver, black eyed peas, broccoli, brussel sprouts, cabbage, chickpeas, cucumber with peel, green onions, green tea, okra, parsley, peas, thyme, and green leafy vegetables like beet greens, collard greens, endive, kale, mustard greens, spinach, turnip greens, watercress, or certain lettuces like green leaf or romaine.  This medicine can cause birth defects or bleeding in an unborn child. Women of childbearing age should use effective birth control while taking this medicine. If a woman becomes pregnant while taking this medicine, she should discuss the potential risks and her options with her health care professional.  Avoid sports and activities that might cause injury while you are using this medicine. Severe falls or injuries can cause unseen bleeding. Be careful when using sharp tools or knives. Consider using   an electric razor. Take special care brushing or flossing your teeth. Report any injuries, bruising, or red spots on the skin to your doctor or health care professional.  If you have an illness that causes vomiting, diarrhea, or fever for more than a few days, contact your doctor. Also check with your doctor if you are unable to eat for several days. These problems can change the effect of this medicine.  Even after you stop taking this medicine, it takes several days before your body recovers its normal ability to clot blood. Ask your doctor or health care professional how long you need to be careful. If you are going to have surgery or dental work, tell your doctor or health care professional that you have been taking this medicine.  What side effects may I notice from  receiving this medicine?  Side effects that you should report to your doctor or health care professional as soon as possible:  -back pain  -chills  -dizziness  -fever  -heavy menstrual bleeding or vaginal bleeding  -painful, blue, or purple toes  -painful, prolonged erection  -signs and symptoms of bleeding such as bloody or black, tarry stools; red or dark-brown urine; spitting up blood or brown material that looks like coffee grounds; red spots on the skin; unusual bruising or bleeding from the eye, gums, or nose-skin rash, itching or skin damage  -stomach pain  -unusually weak or tired  -yellowing of skin or eyes  Side effects that usually do not require medical attention (report to your doctor or health care professional if they continue or are bothersome):  -diarrhea  -hair loss  This list may not describe all possible side effects. Call your doctor for medical advice about side effects. You may report side effects to FDA at 1-800-FDA-1088.  Where should I keep my medicine?  Keep out of the reach of children.  Store at room temperature between 15 and 30 degrees C (59 and 86 degrees F). Protect from light. Throw away any unused medicine after the expiration date. Do not flush down the toilet.  NOTE: This sheet is a summary. It may not cover all possible information. If you have questions about this medicine, talk to your doctor, pharmacist, or health care provider.   2015, [employer]. (2012-12-15 12:17:56)

## 2013-11-30 NOTE — Telephone Encounter (Signed)
Gave pt appt for lab and MD for 2016  °

## 2013-11-30 NOTE — Progress Notes (Signed)
[facility] OFFICE PROGRESS NOTE  Linda Lopes, MD [place] [address]  DIAGNOSIS: DVT (deep venous thrombosis), left - Plan: CBC with Differential, Comprehensive metabolic panel (Cmet) - [facility], Lactate dehydrogenase (LDH) - [facility], Protime-INR  Pulmonary embolism - Plan: CBC with Differential, Comprehensive metabolic panel (Cmet) - [facility], Lactate dehydrogenase (LDH) - [facility], Protime-INR  Chief Complaint  Patient presents with  . DVT (deep venous thrombosis), left    CURRENT THERAPY: Warfarin 7.5 mg daily  INTERVAL HISTORY: [name] 73 y.o. female with a history of a hypercoagulable state complicated by DVT/PE who reports for followup.  She was last seen by Dr. Jamse Arn on 03/02/2013.   Today, she is accompanied by her husband Mikki Santee. She is compliant with warfarin and has her INR checked by Dr. Shon Baton office. Last one was 2.6 about one month ago.   She mild nausea when taking her coumadin but otherwise reports that she is tolerating therapy without difficulties. She denies any recent hospitalizations or emergency room visits.  She continues her CPAP with supplemental oxygen (3 liters) at night.  She had a mammogram on 02/25/2013 followed by ultrasound on 03/09/2013 revealing no abnormalities and noted that the circumscribed small oval mass appearred to demonstrate a fatty hilum on tomographic  images suggesting that it represents a lymph node. She denies any fevers or chills and reports a good appetite without weight lost. She denies bleeding complications. She is following with cardiology without changes in her medications recently. She had in colonoscopy in 2008 with 2 polyps removed by Dr. Amedeo Plenty.   MEDICAL HISTORY: Past Medical History  Diagnosis Date  . Hypertension   . PE (pulmonary embolism)   . Thyroid disease     thyroid cancer  . Cancer     thyroid  . Unspecified deficiency anemia   . OSA on CPAP     05-08-12 AHi was 46, RDI  53.  titrated to   9 cm water  with 3 cm flex.-nadir 75%  . Hyperlipidemia   . Shortness of breath     INTERIM HISTORY: has Pulmonary embolism; DVT (deep venous thrombosis); Cough; OSA (obstructive sleep apnea); Adenopathy; OSA on CPAP; Nocturnal hypoxia; Obstructive sleep apnea (adult) (pediatric); Obesity, unspecified; and Essential hypertension, benign on her problem list.    ALLERGIES:  is allergic to codeine; statins; and latex.  MEDICATIONS: has a current medication list which includes the following prescription(s): alendronate, fluticasone, irbesartan, levothyroxine, lorazepam, lutein-zeaxanthin, polyethyl glycol-propyl glycol, taztia xt, and warfarin.  SURGICAL HISTORY:  Past Surgical History  Procedure Laterality Date  . Appendectomy    . Thyroid surgery    . Liver biopsy    . Abdominal hysterectomy    . Breast biopsy      REVIEW OF SYSTEMS:   Constitutional: Denies fevers, chills or abnormal weight loss Eyes: Denies blurriness of vision Ears, nose, mouth, throat, and face: Denies mucositis or sore throat Respiratory: Denies cough, dyspnea or wheezes Cardiovascular: Denies palpitation, chest discomfort or lower extremity swelling Gastrointestinal:  Denies nausea, heartburn or change in bowel habits Skin: Denies abnormal skin rashes Lymphatics: Denies new lymphadenopathy or easy bruising Neurological:Denies numbness, tingling or new weaknesses Behavioral/Psych: Mood is stable, no new changes  All other systems were reviewed with the patient and are negative.  PHYSICAL EXAMINATION: ECOG PERFORMANCE STATUS: 0 - Asymptomatic  Blood pressure 157/73, pulse 65, temperature 97.6 F (36.4 C), temperature source Oral, resp. rate 18, height 5\' 3"  (1.6 m), weight 226 lb (102.513 kg),  SpO2 94.00%.  GENERAL:alert, no distress and comfortable elderly lady who is mildly obese SKIN: skin color, texture, turgor are normal, no rashes or significant lesions EYES: normal, Conjunctiva are  pink and non-injected, sclera clear OROPHARYNX:no exudate, no erythema and lips, buccal mucosa, and tongue normal  NECK: supple, thyroid normal size, non-tender, without nodularity LYMPH:  no palpable lymphadenopathy in the cervical, supraclavicular LUNGS: clear to auscultation and percussion with normal breathing effort HEART: regular rate & rhythm and no murmurs and no lower extremity edema ABDOMEN:abdomen soft, non-tender and normal bowel sounds Musculoskeletal:no cyanosis of digits and no clubbing  NEURO: alert & oriented x 3 with fluent speech, no focal motor/sensory deficits   LABORATORY DATA: Results for orders placed in visit on 11/30/13 (from the past 48 hour(s))  CBC WITH DIFFERENTIAL     Status: None   Collection Time    11/30/13  8:05 AM      Result Value Ref Range   WBC 6.4  3.9 - 10.3 10e3/uL   NEUT# 3.4  1.5 - 6.5 10e3/uL   HGB 14.0  11.6 - 15.9 g/dL   HCT 42.6  34.8 - 46.6 %   Platelets 216  145 - 400 10e3/uL   MCV 89.7  79.5 - 101.0 fL   MCH 29.5  25.1 - 34.0 pg   MCHC 32.9  31.5 - 36.0 g/dL   RBC 4.75  3.70 - 5.45 10e6/uL   RDW 13.8  11.2 - 14.5 %   lymph# 2.1  0.9 - 3.3 10e3/uL   MONO# 0.8  0.1 - 0.9 10e3/uL   Eosinophils Absolute 0.1  0.0 - 0.5 10e3/uL   Basophils Absolute 0.0  0.0 - 0.1 10e3/uL   NEUT% 52.9  38.4 - 76.8 %   LYMPH% 32.9  14.0 - 49.7 %   MONO% 11.8  0.0 - 14.0 %   EOS% 1.9  0.0 - 7.0 %   BASO% 0.5  0.0 - 2.0 %  COMPREHENSIVE METABOLIC PANEL (QV95)     Status: Abnormal   Collection Time    11/30/13  8:05 AM      Result Value Ref Range   Sodium 141  136 - 145 mEq/L   Potassium 3.9  3.5 - 5.1 mEq/L   Chloride 110 (*) 98 - 109 mEq/L   CO2 24  22 - 29 mEq/L   Glucose 119  70 - 140 mg/dl   BUN 14.7  7.0 - 26.0 mg/dL   Creatinine 0.8  0.6 - 1.1 mg/dL   Total Bilirubin 0.45  0.20 - 1.20 mg/dL   Alkaline Phosphatase 89  40 - 150 U/L   AST 18  5 - 34 U/L   ALT 20  0 - 55 U/L   Total Protein 7.2  6.4 - 8.3 g/dL   Albumin 3.4 (*) 3.5 - 5.0  g/dL   Calcium 8.5  8.4 - 10.4 mg/dL   Anion Gap 7  3 - 11 mEq/L    CMP     Component Value Date/Time   NA 141 11/30/2013 0805   NA 137 03/07/2012 1015   K 3.9 11/30/2013 0805   K 4.0 03/07/2012 1015   CL 104 04/29/2012 1145   CL 102 03/07/2012 1015   CO2 24 11/30/2013 0805   CO2 25 03/07/2012 1015   GLUCOSE 119 11/30/2013 0805   GLUCOSE 93 04/29/2012 1145   GLUCOSE 105* 03/07/2012 1015   BUN 14.7 11/30/2013 0805   BUN 14 03/07/2012 1015   CREATININE 0.8  11/30/2013 0805   CREATININE 0.89 03/07/2012 1015   CALCIUM 8.5 11/30/2013 0805   CALCIUM 9.1 03/07/2012 1015   PROT 7.2 11/30/2013 0805   PROT 8.1 03/16/2011 1819   ALBUMIN 3.4* 11/30/2013 0805   ALBUMIN 3.9 03/16/2011 1819   AST 18 11/30/2013 0805   AST 21 03/16/2011 1819   ALT 20 11/30/2013 0805   ALT 19 03/16/2011 1819   ALKPHOS 89 11/30/2013 0805   ALKPHOS 113 03/16/2011 1819   BILITOT 0.45 11/30/2013 0805   BILITOT 0.3 03/16/2011 1819   GFRNONAA 63* 03/07/2012 1015   GFRAA 73* 03/07/2012 1015      RADIOGRAPHIC STUDIES: None.    ASSESSMENT:  Hypercoagulable state complicated by DVT/PE (8891);   PLAN:  1. Hypercoagulable state complicated by DVT/PE (6945). --She has a history of pulmonary embolism and deep vein thrombosis diagnosed in 2008 and received 6 months of anticoagulation. She also has history of mildly elevated IgM cardiolipin antibodies. My recommendation is that she continue indefinite anticoagulation. She is following her INR levels at her primary care office.  She was provided a handout on warfarin. She is on warfarin 7.5 mg daily.   2. History of a possible R breast mass.  --Ultrasound 03/2013 was negative.  Patient instructed to have annual mammogram in 02/2014.     3. OSA.  --Continue CPAP q hs. She is also on oxygen.   4. Follow up.  -- RTC in one year with basic labs as well.   All questions were answered. The patient knows to call the clinic with any problems, questions or concerns. We can certainly see the  patient much sooner if necessary.  I spent 15 minutes counseling the patient face to face. The total time spent in the appointment was 25 minutes.    CHISM, DAVID, MD 11/30/2013 9:01 AM

## 2014-01-28 ENCOUNTER — Emergency Department (HOSPITAL_BASED_OUTPATIENT_CLINIC_OR_DEPARTMENT_OTHER)
Admission: EM | Admit: 2014-01-28 | Discharge: 2014-01-28 | Disposition: A | Discharge status: Home / Self Care | Payer: Medicare Other | Attending: Emergency Medicine | Admitting: Emergency Medicine

## 2014-01-28 ENCOUNTER — Encounter (HOSPITAL_BASED_OUTPATIENT_CLINIC_OR_DEPARTMENT_OTHER): Payer: Self-pay | Admitting: Emergency Medicine

## 2014-01-28 DIAGNOSIS — Z91040 Latex allergy status: Secondary | ICD-10-CM | POA: Diagnosis not present

## 2014-01-28 DIAGNOSIS — S40269A Insect bite (nonvenomous) of unspecified shoulder, initial encounter: Secondary | ICD-10-CM | POA: Diagnosis present

## 2014-01-28 DIAGNOSIS — Z862 Personal history of diseases of the blood and blood-forming organs and certain disorders involving the immune mechanism: Secondary | ICD-10-CM | POA: Insufficient documentation

## 2014-01-28 DIAGNOSIS — Z8589 Personal history of malignant neoplasm of other organs and systems: Secondary | ICD-10-CM | POA: Insufficient documentation

## 2014-01-28 DIAGNOSIS — Z86711 Personal history of pulmonary embolism: Secondary | ICD-10-CM | POA: Insufficient documentation

## 2014-01-28 DIAGNOSIS — Z7901 Long term (current) use of anticoagulants: Secondary | ICD-10-CM | POA: Diagnosis not present

## 2014-01-28 DIAGNOSIS — E079 Disorder of thyroid, unspecified: Secondary | ICD-10-CM | POA: Insufficient documentation

## 2014-01-28 DIAGNOSIS — I10 Essential (primary) hypertension: Secondary | ICD-10-CM | POA: Insufficient documentation

## 2014-01-28 DIAGNOSIS — Z79899 Other long term (current) drug therapy: Secondary | ICD-10-CM | POA: Insufficient documentation

## 2014-01-28 DIAGNOSIS — IMO0002 Reserved for concepts with insufficient information to code with codable children: Secondary | ICD-10-CM | POA: Diagnosis not present

## 2014-01-28 DIAGNOSIS — Y939 Activity, unspecified: Secondary | ICD-10-CM | POA: Diagnosis not present

## 2014-01-28 DIAGNOSIS — Y929 Unspecified place or not applicable: Secondary | ICD-10-CM | POA: Insufficient documentation

## 2014-01-28 DIAGNOSIS — Z9981 Dependence on supplemental oxygen: Secondary | ICD-10-CM | POA: Diagnosis not present

## 2014-01-28 DIAGNOSIS — W57XXXA Bitten or stung by nonvenomous insect and other nonvenomous arthropods, initial encounter: Secondary | ICD-10-CM | POA: Insufficient documentation

## 2014-01-28 DIAGNOSIS — G4733 Obstructive sleep apnea (adult) (pediatric): Secondary | ICD-10-CM | POA: Diagnosis not present

## 2014-01-28 DIAGNOSIS — Z8639 Personal history of other endocrine, nutritional and metabolic disease: Secondary | ICD-10-CM | POA: Insufficient documentation

## 2014-01-28 MED ORDER — CEPHALEXIN 250 MG PO CAPS
500.0000 mg | ORAL_CAPSULE | Freq: Once | ORAL | Status: AC
  Administered 2014-01-28: 500 mg via ORAL
  Filled 2014-01-28: qty 2

## 2014-01-28 MED ORDER — CEPHALEXIN 500 MG PO CAPS: 500.0000 mg | ORAL_CAPSULE | Freq: Two times a day (BID) | ORAL | Status: DC

## 2014-01-28 MED ORDER — SULFAMETHOXAZOLE-TMP DS 800-160 MG PO TABS: 1.0000 | ORAL_TABLET | Freq: Once | ORAL | Status: DC

## 2014-01-28 NOTE — ED Provider Notes (Signed)
CSN: [id number]     Arrival date & time 01/28/14  2150 History  This chart was scribed for Carmin Muskrat, MD, by Neta Ehlers, ED Scribe. This patient was seen in room MH03/MH03 and the patient's care was started at 10:00 PM.   First MD Initiated Contact with Patient 01/28/14 2159     Chief Complaint  Patient presents with  . Insect Bite    The history is provided by the patient. No language interpreter was used.   HPI Comments: [name] is a 73 y.o. female, with a h/o HTN, who presents to the Emergency Department complaining of a suspected spider bite to her left chest wall which occurred early this morning. She reports she felt acute pain to her arm and shoulder in the early morning which she attributes to the bite; she took IBU to address the pain with moderate relief. [name] reports the suspected bite has increased in size throughout the day. She endorses a subjective fever; in the ED her temperature is 98 F. She denies emesis or confusion.   Dr. Sharlett Iles is her PCP.   Past Medical History  Diagnosis Date  . Hypertension   . PE (pulmonary embolism)   . Thyroid disease     thyroid cancer  . Cancer     thyroid  . Unspecified deficiency anemia   . OSA on CPAP     05-08-12 AHi was 46, RDI  53. titrated to   9 cm water  with 3 cm flex.-nadir 75%  . Hyperlipidemia   . Shortness of breath    Past Surgical History  Procedure Laterality Date  . Appendectomy    . Thyroid surgery    . Liver biopsy    . Abdominal hysterectomy    . Breast biopsy     Family History  Problem Relation Age of Onset  . CVA Mother    History  Substance Use Topics  . Smoking status: Never Smoker   . Smokeless tobacco: Never Used  . Alcohol Use: No   No OB history provided.  Review of Systems  Constitutional: Positive for fever.       Per HPI, otherwise negative  HENT:       Per HPI, otherwise negative  Respiratory:       Per HPI, otherwise negative  Cardiovascular:        Per HPI, otherwise negative  Gastrointestinal: Negative for vomiting.  Endocrine:       Negative aside from HPI  Genitourinary:       Neg aside from HPI   Musculoskeletal: Positive for myalgias.       Per HPI, otherwise negative  Skin: Negative.   Psychiatric/Behavioral: Negative for confusion.    Allergies  Codeine; Statins; and Latex  Home Medications   Prior to Admission medications   Medication Sig Start Date End Date Taking? Authorizing Provider  alendronate (FOSAMAX) 70 MG tablet Take 70 mg by mouth once a week.  09/30/13   Historical Provider, MD  fluticasone (FLONASE) 50 MCG/ACT nasal spray Place 1 spray into the nose daily. For allergies.    Historical Provider, MD  irbesartan (AVAPRO) 300 MG tablet Take 0.5 tablets (150 mg total) by mouth at bedtime. 10/18/13   Jettie Booze, MD  levothyroxine (SYNTHROID, LEVOTHROID) 112 MCG tablet Take 112 mcg by mouth daily.    Historical Provider, MD  LORazepam (ATIVAN) 1 MG tablet Take 0.5 mg by mouth as needed.     Historical Provider, MD  Lutein-Zeaxanthin 25-5 MG CAPS Take by mouth.    Historical Provider, MD  Polyethyl Glycol-Propyl Glycol (SYSTANE OP) Apply to eye.    Historical Provider, MD  TAZTIA XT 360 MG 24 hr capsule Take 360 mg by mouth daily.  05/08/13   Historical Provider, MD  warfarin (COUMADIN) 7.5 MG tablet Take 7.5 mg by mouth daily.    Historical Provider, MD   BP 181/82  Pulse 74  Temp(Src) 98 F (36.7 C) (Oral)  Resp 16  Ht 5\' 3"  (1.6 m)  Wt 222 lb (100.699 kg)  BMI 39.34 kg/m2  SpO2 98%  Physical Exam  Nursing note and vitals reviewed. Constitutional: She is oriented to person, place, and time. She appears well-developed and well-nourished. No distress.  HENT:  Head: Normocephalic and atraumatic.  Eyes: Conjunctivae and EOM are normal.  Cardiovascular: Normal rate, regular rhythm and normal heart sounds.   No murmur heard. Pulmonary/Chest: Effort normal and breath sounds normal. No  stridor. No respiratory distress. She has no wheezes.  Abdominal: She exhibits no distension.  Musculoskeletal: She exhibits no edema.  Neurological: She is alert and oriented to person, place, and time. No cranial nerve deficit.  Skin: Skin is warm and dry.  Left lower chest wall, an area of 9 cm by 4 cm across. Central area has excoriation of skin. No discharge.   Psychiatric: She has a normal mood and affect.    ED Course  Procedures (including critical care time)   COORDINATION OF CARE:  10:04 PM- Discussed treatment plan with patient, and the patient agreed to the plan. The plan includes antibiotics.   MDM   I personally performed the services described in this documentation, which was scribed in my presence. The recorded information has been reviewed and is accurate.  Patient presents with a cutaneous lesion of unclear etiology, possibly due to an insect bite. Patient has no evidence of sepsis, systemic infection, nor progression of disease. The patient's wound had no area of drainage. Patient was started on antibiotics and discharged to follow up with primary care.   Carmin Muskrat, MD 01/28/14 2246

## 2014-01-28 NOTE — ED Notes (Signed)
Pt c/o bit to abdomen this morning. Reports it's gotten bigger throughout the day

## 2014-01-28 NOTE — ED Notes (Signed)
Pt discharged to home with family. NAD.  

## 2014-02-22 ENCOUNTER — Other Ambulatory Visit: Payer: Self-pay

## 2014-02-22 DIAGNOSIS — Z1231 Encounter for screening mammogram for malignant neoplasm of breast: Secondary | ICD-10-CM

## 2014-03-03 ENCOUNTER — Ambulatory Visit
Admission: RE | Admit: 2014-03-03 | Discharge: 2014-03-03 | Disposition: A | Discharge status: Home / Self Care | Payer: Medicare Other | Source: Ambulatory Visit

## 2014-03-03 DIAGNOSIS — Z1231 Encounter for screening mammogram for malignant neoplasm of breast: Secondary | ICD-10-CM

## 2014-05-24 ENCOUNTER — Ambulatory Visit: Payer: Medicare Other | Admitting: Neurology

## 2014-05-24 ENCOUNTER — Encounter: Payer: Self-pay | Admitting: Neurology

## 2014-05-24 ENCOUNTER — Ambulatory Visit (INDEPENDENT_AMBULATORY_CARE_PROVIDER_SITE_OTHER): Payer: Medicare Other | Admitting: Neurology

## 2014-05-24 VITALS — BP 184/90 | HR 66 | Resp 14 | Ht 62.5 in | Wt 227.0 lb

## 2014-05-24 DIAGNOSIS — R0902 Hypoxemia: Secondary | ICD-10-CM

## 2014-05-24 DIAGNOSIS — D860 Sarcoidosis of lung: Secondary | ICD-10-CM

## 2014-05-24 DIAGNOSIS — K144 Atrophy of tongue papillae: Secondary | ICD-10-CM | POA: Insufficient documentation

## 2014-05-24 DIAGNOSIS — D6859 Other primary thrombophilia: Secondary | ICD-10-CM

## 2014-05-24 DIAGNOSIS — Z9989 Dependence on other enabling machines and devices: Principal | ICD-10-CM

## 2014-05-24 DIAGNOSIS — D869 Sarcoidosis, unspecified: Secondary | ICD-10-CM | POA: Insufficient documentation

## 2014-05-24 DIAGNOSIS — G4733 Obstructive sleep apnea (adult) (pediatric): Secondary | ICD-10-CM

## 2014-05-24 NOTE — Patient Instructions (Addendum)
Sarcoidosis, Schaumann's Disease, Sarcoid of [place] Sarcoidosis appears briefly and heals naturally in 60 to 70 percent of cases, often without the patient knowing or doing anything about it. 20 to 30 percent of patients with sarcoidosis are left with some permanent lung damage. In 10 to 15 percent of the patients, sarcoidosis can become chronic (long lasting). When either the granulomas or fibrosis seriously affect the function of a vital organ (lungs, heart, nervous system, liver, or kidneys), sarcoidosis can be fatal. This occurs 5 to 10 percent of the time. No one can predict how sarcoidosis will progress in an individual patient. The symptoms the patient experiences, the caregiver's findings, and the patient's race can give some clues. Sarcoidosis was once considered a rare disease. We now know that it is a common chronic illness that appears all over the world. It is the most common of the fibrotic (scarring) lung disorders. Anyone can get sarcoidosis. It occurs in all races and in both sexes. The risk is greater if you are a young black adult, especially a black woman, or are of Scandinavian, German, Irish, or Puerto Rican origin. In sarcoidosis, small lumps (also called nodules or granulomas) develop in multiple organs of the body. These granulomas are small collections of inflamed cells. They commonly appear in the lungs. This is the most common organ affected. They also occur in the lymph nodes (your glands), skin, liver, and eyes. The granulomas vary in the amount of disease they produce from very little with no problems (symptoms) to causing severe illness. The cause of sarcoidosis is not known. It may be due to an abnormal immune reaction in the body. Most people will recover. A few people will develop long lasting conditions that may get worse. Women are affected more often than men. The majority of those affected are under forty years of age. Because we do not know the cause, we do not have ways to  prevent it. SYMPTOMS   Fever.  Loss of appetite.  Night sweats.  Joint pain.  Aching muscles Symptoms vary because the disease affects different parts of the body in different people. Most people who see their caregiver with sarcoidosis have lung problems. The first signs are usually a dry cough and shortness of breath. There may also be wheezing, chest pain, or a cough that brings up bloody mucus. In severe cases, lung function may become so poor that the person cannot perform even the simple routine tasks of daily life. Other symptoms of sarcoidosis are less common than lung symptoms. They can include:  Skin symptoms. Sarcoidosis can appear as a collection of tender, red bumps called erythema nodosum. These bumps usually occur on the face, shins, and arms. They can also occur as a scaly, purplish discoloration on the nose, cheeks, and ears. This is called lupus pernio. Less often, sarcoidosis causes cysts, pimples, or disfiguring over growths of skin. In many cases, the disfiguring over growths develop in areas of scars or tattoos.  Eye symptoms. These include redness, eye pain, and sensitivity to light.  Heart symptoms. These include irregular heartbeat and heart failure.  Other symptoms. A person may have paralyzed facial muscles, seizures, psychiatric symptoms, swollen salivary glands, or bone pain. DIAGNOSIS  Even when there are no symptoms, your caregiver can sometimes pick up signs of sarcoidosis during a routine examination, usually through a chest x-ray or when checking other complaints. The patient's age and race or ethnic group can raise an additional red flag that a sign or symptom could   be related to sarcoidosis.   Enlargement of the salivary or tear glands and cysts in bone tissue may also be caused by sarcoidosis.  You may have had a biopsy done that shows signs of sarcoidosis. A biopsy is a small tissue sample that is removed for laboratory testing. This tissue sample can  be taken from your lung, skin, lip, or another inflamed or abnormal area of the body.  You may have had an abnormal chest X-ray. Although you appear healthy, a chest X-ray ordered for other reasons may turn up abnormalities that suggest sarcoidosis.  Other tests may be needed. These tests may be done to rule out other illnesses or to determine the amount of organ damage caused by sarcoidosis. Some of the most common tests are:  Blood levels of calcium or angiotensin-converting enzyme may be high in people with sarcoidosis.  Blood tests to evaluate how well your liver is functioning.  Lung function tests to measure how well you are breathing.  A complete eye examination. TREATMENT  If sarcoidosis does not cause any problems, treatment may not be necessary. Your caregiver may decide to simply monitor your condition. As part of this monitoring process, you may have frequent office visits, follow-up chest X-rays, and tests of your lung function.If you have signs of moderate or severe lung disease, your doctor may recommend:  A corticosteroid drug, such as prednisone (sold under several brand names).  Corticosteroids also are used to treat sarcoidosis of the eyes, joints, skin, nerves, or heart.  Corticosteroid eye drops may be used for the eyes.  Over-the-counter medications like nonsteroidal anti-inflammatory drugs (NSAID) often are used to treat joint pain first before corticosteroids, which tend to have more side effects.  If corticosteroids are not effective or cause serious side effects, other drugs that alter or suppress the immune system may be used.  In rare cases, when sarcoidosis causes life-threatening lung disease, a lung transplant may be necessary. However, there is some risk that the new lungs also will be attacked by sarcoidosis. SEEK IMMEDIATE MEDICAL CARE IF:   You suffer from shortness of breath or a lingering cough.  You develop new problems that may be related to the  disease. Remember this disease can affect almost all organs of the body and cause many different problems. Document Released: 03/26/2004 Document Revised: 08/18/2011 Document Reviewed: 09/21/2013 [facility] Patient Information 2015 [place], [place]. This information is not intended to replace advice given to you by your health care provider. Make sure you discuss any questions you have with your health care provider.   Dear [name]. I will order today an overnight pulse oximetry that shall be performed while you have your oxygen and CPAP in place so we can see if you need additional oxygen or not. I've also ordered an iron panel because you have a very light colored dark red tongue that is not explained by a bronchitis at all. I wonder about an iron deficiency. I will also order the ANA and sedimentation rate. I will share the results with her primary care physician Dr. Philip Aspen when they come in. If you sign up to my chart you can see the results on screen otherwise please call a week after we obtained blood tests to [place] for results on the phone.    Sincerely Larey Seat M.D.   And .Marland Kitchen...happy holidays !

## 2014-05-24 NOTE — Progress Notes (Signed)
[facility]  Provider:  Larey Mclean, M D  Referring Provider: Leanna Battles, MD Primary Care Physician:  Linda Lopes, MD  Chief Complaint  Patient presents with  . RV sleep/cpap    Rm 11, alone    HPI:  [name] is a 73 y.o. female seen here as a yearly rv patient , originally referred by  Dr. Philip Mclean , now seen for compliance on CPAP therapy.     Interval history : [name]  is seen here today for regular yearly revisit.  She is as usual a happy individual well groomed and optimistic. She is looking forward to the holidays. Today's fatigue severity score was endorsed at 18 points her sleepiness score at 4 points.  The patient's download from her CPAP machine shows an 84% compliance over the last 90 days. Due to an upper respiratory infection she was unable from November 16 through November 29 to use the machine and she had phlegm and other discharge that prohibited the use of nasal CPAP. With his medical extension she still has an 84% compliance and an average use the time of 6 hours and 35 minutes. Set pressure is 9 cm water without EPR her residual AHI is 0.9 which is excellent. The patient had a productive cough and probably bronchitis as well as sinusitis. She felt more often lightheaded." swimmy headed " when moving her head quickly. BP may have been lower or oxygen was low.   She has used Mucinex and has taken lots of fluids which helped her to break down the phlegm and she is now able to breathe unrestricted again and can use the CPAP. Her sleep habits are unchanged but she reports that she dreams more when she uses a decongestant. That is affected is well known. I will order an ONO on oxygen and on CPAP to confirm she has sufficient supplementation.  Last years bone scan and spine image had revealed a compression fracture and bone density was low.  She is now taking a FOSAMAX  once a week. Prescriber Dr. Philip Mclean.   Last visit  in12-2014: CD The patient's primary Physician had performed an overnight pulse oximetry which indicated tachy-bradycardia arrhythmia and hypoxemia. The patient had been placed on 2 L of oxygen at night  In response , before any sleep specific test/ evaluations took place.  On 05-08-12 in a polysomnography , she was diagnosed with a rather severe obstructive apnea,  actually a mixed type of apnea . Her  AHI was 46,  her RDI 53,  her nadir for oxygen 75%.The 102 minutes duration of oxygen desaturations were impressive . The patient was titrated to 9 cm water CPAP which resolved the AHI , yet she actually needed an additional 3 L of oxygen the night after CPAP was max- benefit - 2 liters would maintain levels at 89% saturation and above.  She uses a Respironics mask (  medium-size with humidity. We had multiple followup visits since the initial sleep study.  The patient's compliance has been excellent but recently she had started on Vytorin and medication she seems to have trouble to tolerate. r Epworth score was 7 points - same as of November 2013 after starting the CPAP therapy. Her fatigue severity score in the last visit was 25 points. She continued on 3 L of oxygen with CPAP. She has been she had been started on the right or as an anticoagulant in 2013 briefly and her hematologist  suspected that the patient has  sarcoidosis.  Her sleep habits are unchanged since CPAP initiation. : The patient and her husband have very and change sleep habits, innominate waterbed before 11 PM and as early as 9:30. She falls asleep rather promptly less than 10 minutes, she has to go up to have a bathroom break about once at night if any sense using CPAP. She sleeps until 6:30 AM and wakes spontaneously without the use of an alarm clock.  She will have a cup of coffee in the morning she may drink he coffee at its root the afternoon. She does not drink caffeinated beverages later than that the than the nighttime.  This  Patient is a lifelong  Nonsmoker,  non-alcohol user- there have never been any recreational drugs.She has been she had been started on the right or as an anticoagulant in 2013 briefly and her hematologist  suspected that the patient has sarcoidosis.  She is very visit colorful dreams but she has not acted out on the dreams, usually able to recall them.  Her husband, himself a sound sleeper, does not know of any REM behavior, sleep walking or sleep talking.   She has a childhood history of sleep walking . She recently dreamed a little more at night, after years of not reporting any dreams.   An in -office download  [place] in 2014  an average daily time in CPAP therapy of 6 hours 51 minutes, a  100% compliance by days and use of four  hours.  Residual AHI of 0.6 that pressure still 9 cm. EPR level is not longer present. Her fatigue severity questionnaire was onset at 21 points are Epworth Sleepiness Scale at 7 points.     Review of Systems: Out of a complete 14 system review, the patient complains of only the following symptoms, and all other reviewed systems are negative. Joint pain, weight gain,  Back pain, drooling , palpitations, leg edema,  Runny nose, cough, chills,  Anxiety.   History   Social History  . Marital Status: Married    Spouse Name: N/A    Number of Children: N/A  . Years of Education: N/A   Occupational History  . Retired    Social History Main Topics  . Smoking status: Never Smoker   . Smokeless tobacco: Never Used  . Alcohol Use: No  . Drug Use: No  . Sexual Activity: Not on file   Other Topics Concern  . Not on file   Social History Narrative    Family History  Problem Relation Age of Onset  . CVA Mother     Past Medical History  Diagnosis Date  . Hypertension   . PE (pulmonary embolism)   . Thyroid disease     thyroid cancer  . Cancer     thyroid  . Unspecified deficiency anemia   . OSA on CPAP     05-08-12 AHi was 46, RDI  53. titrated to    9 cm water  with 3 cm flex.-nadir 75%  . Hyperlipidemia   . Shortness of breath   . Spider bite     Past Surgical History  Procedure Laterality Date  . Appendectomy    . Thyroid surgery    . Liver biopsy    . Abdominal hysterectomy    . Breast biopsy      Current Outpatient Prescriptions  Medication Sig Dispense Refill  . fluticasone (FLONASE) 50 MCG/ACT nasal spray Place 1 spray into the nose daily. For allergies.    Marland Kitchen  irbesartan (AVAPRO) 300 MG tablet Take 0.5 tablets (150 mg total) by mouth at bedtime. 45 tablet 3  . levothyroxine (SYNTHROID, LEVOTHROID) 112 MCG tablet Take 112 mcg by mouth daily.    Marland Kitchen LORazepam (ATIVAN) 1 MG tablet Take 0.5 mg by mouth as needed.     . Lutein-Zeaxanthin 25-5 MG CAPS Take by mouth.    [name] Glycol-Propyl Glycol (SYSTANE OP) Apply to eye.    Marland Kitchen TAZTIA XT 360 MG 24 hr capsule Take 360 mg by mouth daily.     Marland Kitchen warfarin (COUMADIN) 7.5 MG tablet Take 7.5 mg by mouth daily.    Marland Kitchen alendronate (FOSAMAX) 70 MG tablet Take 70 mg by mouth once a week.      No current facility-administered medications for this visit.    Allergies as of 05/24/2014 - Review Complete 05/24/2014  Allergen Reaction Noted  . Codeine Other (See Comments) 03/16/2011  . Erythromycin Itching 05/24/2014  . Statins  11/24/2013  . Tetracyclines & related Itching 05/24/2014  . Latex Rash 03/07/2012    Vitals: BP 184/90 mmHg  Pulse 66  Resp 14  Ht 5' 2.5" (1.588 m)  Wt 227 lb (102.967 kg)  BMI 40.83 kg/m2 Last Weight:  Wt Readings from Last 1 Encounters:  05/24/14 227 lb (102.967 kg)   Last Height:   Ht Readings from Last 1 Encounters:  05/24/14 5' 2.5" (1.588 m)    Physical exam:  General: The patient is awake, alert and appears not in acute distress. The patient is well groomed. Head: Normocephalic, atraumatic. Neck is supple. Mallampati 2  neck circumference:16 . No retrognathia . Cardiovascular: irregular rate and rhythm- palpitations,  without  murmurs  or carotid bruit, and without distended neck veins. Respiratory: Lungs are clear to auscultation. Skin:  Without evidence of edema, or rash Trunk: BMI is elevated . The  patient  has normal posture.  Neurologic exam : The patient is awake and alert, oriented to place and time.  Memory subjective  described as intact. There is a normal attention span & concentration ability.  Speech is fluent without  dysarthria, dysphonia or aphasia. Mood and affect are appropriate.  Cranial nerves: Pupils are equal and briskly reactive to light. Funduscopic exam without   evidence of pallor or edema. Extraocular movements  in vertical and horizontal planes intact and without nystagmus. Visual fields by finger perimetry are intact. Hearing to finger rub intact.  Facial sensation intact to fine touch. Facial motor strength is symmetric and tongue and uvula move midline.  Motor exam:   Normal tone and normal muscle bulk and symmetric normal strength in all extremities.  Sensory:  Fine touch, pinprick and vibration were tested in all extremities.  Proprioception is tested in the upper extremities only. This was   normal.  Coordination: Rapid alternating movements in the fingers/hands is tested and normal.  Finger-to-nose maneuver tested and normal without evidence of ataxia, dysmetria or tremor.  Gait and station: Patient walks without assistive device .    Assessment:  After physical and neurologic examination, review of laboratory studies, imaging, neurophysiology testing and pre-existing records, assessment is ; 1) OSA /  CSA on CPAP with good resolution of apnea , additional oxygen at 3 liters helped to improve her nadir . 2) sarcoidosis ' diagnosed in 1972, undiagnosed in 1995 and now again discussed'.  3) arrhythmia. 4) weight loss    Plan:  Treatment plan and additional workup : Continue CPAP at current level of pressure, continue on oxygen 3  liters.   BMI is her main problem - she would like  to look up a nutritional approach , DASH diet discussed.  DME : Patient is not happy with [employer]. Can she change to a more attentive provider?

## 2014-05-25 LAB — TSH+FREE T4
FREE T4: 1.4 ng/dL (ref 0.82–1.77)
TSH: 2.5 u[IU]/mL (ref 0.450–4.500)

## 2014-05-25 LAB — IRON AND TIBC
Iron Saturation: 20 % (ref 15–55)
Iron: 63 ug/dL (ref 35–155)
TIBC: 321 ug/dL (ref 250–450)
UIBC: 258 ug/dL (ref 150–375)

## 2014-05-25 LAB — ANA W/REFLEX IF POSITIVE: Anti Nuclear Antibody(ANA): NEGATIVE

## 2014-05-25 LAB — FERRITIN: FERRITIN: 71 ng/mL (ref 15–150)

## 2014-05-25 LAB — ANGIOTENSIN CONVERTING ENZYME: Angio Convert Enzyme: 79 U/L (ref 14–82)

## 2014-05-26 ENCOUNTER — Encounter: Payer: Self-pay | Admitting: *Deleted

## 2014-08-02 ENCOUNTER — Other Ambulatory Visit: Payer: Self-pay | Admitting: Dermatology

## 2014-08-17 ENCOUNTER — Other Ambulatory Visit: Payer: Self-pay | Admitting: Dermatology

## 2014-09-13 ENCOUNTER — Telehealth: Payer: Self-pay

## 2014-09-13 NOTE — Telephone Encounter (Signed)
Patient call about her avapro, she could not get it fill from the pharmacy because she is taking brand name and its hard to get it from the [facility]. So they had to order the med and it will be here  On 09/14/14. I talked to the patient and she verbally unstood

## 2014-10-18 ENCOUNTER — Telehealth: Payer: Self-pay | Admitting: Internal Medicine

## 2014-10-18 NOTE — Telephone Encounter (Signed)
Called and left a message with new dr Julien Nordmann appointment

## 2014-10-25 ENCOUNTER — Encounter: Payer: Self-pay | Admitting: Interventional Cardiology

## 2014-10-25 ENCOUNTER — Ambulatory Visit (INDEPENDENT_AMBULATORY_CARE_PROVIDER_SITE_OTHER): Payer: Medicare Other | Admitting: Interventional Cardiology

## 2014-10-25 VITALS — BP 160/82 | HR 59 | Ht 64.0 in | Wt 228.4 lb

## 2014-10-25 DIAGNOSIS — R05 Cough: Secondary | ICD-10-CM

## 2014-10-25 DIAGNOSIS — E669 Obesity, unspecified: Secondary | ICD-10-CM

## 2014-10-25 DIAGNOSIS — I2699 Other pulmonary embolism without acute cor pulmonale: Secondary | ICD-10-CM

## 2014-10-25 DIAGNOSIS — G4733 Obstructive sleep apnea (adult) (pediatric): Secondary | ICD-10-CM | POA: Diagnosis not present

## 2014-10-25 DIAGNOSIS — I10 Essential (primary) hypertension: Secondary | ICD-10-CM | POA: Diagnosis not present

## 2014-10-25 DIAGNOSIS — R059 Cough, unspecified: Secondary | ICD-10-CM

## 2014-10-25 DIAGNOSIS — E782 Mixed hyperlipidemia: Secondary | ICD-10-CM

## 2014-10-25 NOTE — Patient Instructions (Signed)
Medication Instructions:  Your physician recommends that you continue on your current medications as directed. Please refer to the Current Medication list given to you today.   Labwork: We will get a copy of your recent lab work from [facility] will be in contact with you if we need to setup an appointment.  Testing/Procedures: NONE  Follow-Up: Your physician wants you to follow-up in: 12 months with Dr. Irish Lack. You will receive a reminder letter in the mail two months in advance. If you don't receive a letter, please call our office to schedule the follow-up appointment.   Any Other Special Instructions Will Be Listed Below (If Applicable).

## 2014-10-25 NOTE — Progress Notes (Signed)
Patient ID: [name], female   DOB: 1940/11/06, 74 y.o.   MRN: [id number]     Cardiology Office Note   Date:  10/25/2014   ID:  [name], DOB 08-Jan-1941, MRN [id number]  PCP:  [name], MD    No chief complaint on file. HTN   Wt Readings from Last 3 Encounters:  10/25/14 228 lb 6.4 oz (103.602 kg)  05/24/14 227 lb (102.967 kg)  01/28/14 222 lb (100.699 kg)       History of Present Illness: [name] is a 74 y.o. female   with a h/o of DVT/PE. SHe was treated with Coumadin for 6 months. No reason found for DVTs. She had a stress test in 4/09 which was negative for ischemia. She exercised for 6:15 seconds. She had IBS but in 4/10, had persistent pain and had blood in her stool while off coumadin, she was diagnosed with ischemic colitis. Later, she states she was diagnosed with polycythemia, which was being managed by Dr. Jamse Arn. She was also placed on Xarelto, after a CT scan but then switched back to Coumadin.;.  BP has been well controlled. See below. Readings typically higher in the MDs office.  Hypertension:  felt poorly off of brand name Avapro. Has tried losartan and irbesartan. BP was not well controlled off of Avapro. Cough resolved. c/o Leg edema more at the end of the day.  Denies : Orthopnea.  Paroxysmal nocturnal dyspnea.  Palpitations.  Syncope.  controlled at home. No chest pain with exertion. Occasional CP with anxiety.  Overall doing well. Nt exercising as muh as she should per her report.  Since the last visit, she had a spider bite.  She was treated with an antibiotic.  She took tylenol for pain relief.  She had persistent arm pain.     BP has been  in the 732-202 range systolic for the most part. i reviewed her readings today.  Er brand of warfarin has changed.  SHe feels that she has some itching and irritability as well.  SHe has ginaed weight.  She had a skin cancer removal also.  She required some ABx at that  time.     Past Medical History  Diagnosis Date  . Hypertension   . PE (pulmonary embolism)   . Thyroid disease     thyroid cancer  . Cancer     thyroid  . Unspecified deficiency anemia   . OSA on CPAP     05-08-12 AHi was 46, RDI  53. titrated to   9 cm water  with 3 cm flex.-nadir 75%  . Hyperlipidemia   . Shortness of breath   . Spider bite     Past Surgical History  Procedure Laterality Date  . Appendectomy    . Thyroid surgery    . Liver biopsy    . Abdominal hysterectomy    . Breast biopsy       Current Outpatient Prescriptions  Medication Sig Dispense Refill  . alendronate (FOSAMAX) 70 MG tablet Take 70 mg by mouth once a week.     . fluticasone (FLONASE) 50 MCG/ACT nasal spray Place 1 spray into the nose daily. For allergies.    Marland Kitchen irbesartan (AVAPRO) 300 MG tablet Take 0.5 tablets (150 mg total) by mouth at bedtime. 45 tablet 3  . levothyroxine (SYNTHROID, LEVOTHROID) 100 MCG tablet Take 100 mcg by mouth daily.  1  . LORazepam (ATIVAN) 1 MG tablet Take 0.5 mg by mouth as needed  for anxiety.     . Lutein-Zeaxanthin 25-5 MG CAPS Take 1 tablet by mouth daily.     [name] (SYSTANE OP) Apply 1 drop to eye at bedtime.     . TAZTIA XT 360 MG 24 hr capsule Take 360 mg by mouth daily.     Marland Kitchen warfarin (COUMADIN) 7.5 MG tablet Take 7.5 mg by mouth daily.     No current facility-administered medications for this visit.    Allergies:   Codeine; Erythromycin; Tetracyclines & related; Statins; and Latex    Social History:  The patient  reports that she has never smoked. She has never used smokeless tobacco. She reports that she does not drink alcohol or use illicit drugs.   Family History:  The patient's *family history includes CVA in her mother.    ROS:  Please see the history of present illness.   Otherwise, review of systems are positive for leg pain after surgery, which has improved.   All other systems are reviewed and negative.     PHYSICAL EXAM: VS:  BP 160/82 mmHg  Pulse 59  Ht 5\' 4"  (1.626 m)  Wt 228 lb 6.4 oz (103.602 kg)  BMI 39.19 kg/m2 , BMI Body mass index is 39.19 kg/(m^2). GEN: Well nourished, well developed, in no acute distress HEENT: normal Neck: no JVD, carotid bruits, or masses Cardiac: RRR; no murmurs, rubs, or gallops,no edema  Respiratory:  clear to auscultation bilaterally, normal work of breathing GI: soft, nontender, nondistended, + BS MS: no deformity or atrophy Skin: warm and dry, no rash Neuro:  Strength and sensation are intact Psych: euthymic mood, full affect   EKG:   The ekg ordered today demonstrates Normal ECG   Recent Labs: 11/30/2013: ALT 20; BUN 14.7; Creatinine 0.8; Hemoglobin 14.0; Platelets 216; Potassium 3.9; Sodium 141 05/24/2014: TSH 2.500   Lipid Panel No results found for: CHOL, TRIG, HDL, CHOLHDL, VLDL, LDLCALC, LDLDIRECT   Other studies Reviewed: Additional studies/ records that were reviewed today with results demonstrating: LVEF 60-65% in 12/13..   ASSESSMENT AND PLAN:  1. Hypertension, essential  Continue Diltiazem HCl ER Beads Capsule Extended Release 24 Hour, 360 MG, 1 capsule, Orally, Once a day BP with variable control. Does well on brand name Avapro. BP readings were increased when generic Avapro was used.  Will need to inform insurance company about this again.  Letter given to the refill dept.   2. Mixed hyperlipidemia  Stopped Omega-3 Capsule, 2.59 GRAMS, 1 TABLET, Orally, DAILY. Off of statin. She is interested in PCSK-9 inhibitor trial. TC 308. She has been intolerant of Crestor, Lipitor, simvastatin.  Did  tolerate Zetia in the past, but was stopped.  She is willing to try this again.  WIll also look into PCSK9 inhibitor. Obtain results and give to Pharm D in our office.  3.  Obesity:  She has gained weight.  She had a leg operation for skin cancer.  She decreased exercise sine that time.  Stressed importanc eof diet and exercise.   4 She  well follow-up with her primary care doctor regarding her gallbladder polyp. This is a source of concern for her. 5.  No bleeding problems on her chronic anticoagulation for DVT/PE in the past.. TOlerating CPAP well.      Current medicines are reviewed at length with the patient today.  The patient concerns regarding her medicines were addressed.  The following changes have been made:  No change  Labs/ tests ordered today include: Obtain  results from Dr. Sharlett Iles office No orders of the defined types were placed in this encounter.    Recommend 150 minutes/week of aerobic exercise Low fat, low carb, high fiber diet recommended  Disposition:   FU in 1 year   Teresita Madura., MD  10/25/2014 10:05 AM    [facility] [address], [place], [place]  [address] Phone: [phone]; Fax: [phone]

## 2014-10-30 ENCOUNTER — Other Ambulatory Visit: Payer: Self-pay | Admitting: *Deleted

## 2014-10-30 MED ORDER — EZETIMIBE 10 MG PO TABS: 10.0000 mg | ORAL_TABLET | Freq: Every day | ORAL | Status: DC

## 2014-11-08 ENCOUNTER — Ambulatory Visit (INDEPENDENT_AMBULATORY_CARE_PROVIDER_SITE_OTHER): Payer: Medicare Other | Admitting: Ophthalmology

## 2014-11-08 DIAGNOSIS — H33301 Unspecified retinal break, right eye: Secondary | ICD-10-CM

## 2014-11-08 DIAGNOSIS — H43813 Vitreous degeneration, bilateral: Secondary | ICD-10-CM

## 2014-11-08 DIAGNOSIS — H3531 Nonexudative age-related macular degeneration: Secondary | ICD-10-CM | POA: Diagnosis not present

## 2014-11-08 DIAGNOSIS — H2513 Age-related nuclear cataract, bilateral: Secondary | ICD-10-CM | POA: Diagnosis not present

## 2014-11-08 DIAGNOSIS — H35371 Puckering of macula, right eye: Secondary | ICD-10-CM | POA: Diagnosis not present

## 2014-11-08 DIAGNOSIS — I10 Essential (primary) hypertension: Secondary | ICD-10-CM | POA: Diagnosis not present

## 2014-11-08 DIAGNOSIS — H35033 Hypertensive retinopathy, bilateral: Secondary | ICD-10-CM | POA: Diagnosis not present

## 2014-11-10 ENCOUNTER — Telehealth: Payer: Self-pay | Admitting: *Deleted

## 2014-11-10 ENCOUNTER — Other Ambulatory Visit: Payer: Self-pay

## 2014-11-10 ENCOUNTER — Telehealth: Payer: Self-pay

## 2014-11-10 MED ORDER — IRBESARTAN 300 MG PO TABS: 150.0000 mg | ORAL_TABLET | Freq: Every day | ORAL | Status: DC

## 2014-11-10 NOTE — Telephone Encounter (Signed)
Obtained approval for BRAND name Avapro 300mg  through [facility] [facility]. Patient advised of this.

## 2014-11-10 NOTE — Telephone Encounter (Signed)
Prior auth sent to [facility] for BRAND Avapro 300mg  thru Cover My Meds.

## 2014-11-10 NOTE — Telephone Encounter (Signed)
Patient called and stated that the brand name irbesartan needs to be approved through her insurance for this year.

## 2014-11-13 ENCOUNTER — Telehealth: Payer: Self-pay

## 2014-11-13 NOTE — Telephone Encounter (Signed)
Avapro 300mg  approved for Brand Name Only. Patient aware.

## 2014-11-29 ENCOUNTER — Ambulatory Visit (HOSPITAL_BASED_OUTPATIENT_CLINIC_OR_DEPARTMENT_OTHER): Payer: Medicare Other | Admitting: Internal Medicine

## 2014-11-29 ENCOUNTER — Other Ambulatory Visit (HOSPITAL_BASED_OUTPATIENT_CLINIC_OR_DEPARTMENT_OTHER): Payer: Medicare Other

## 2014-11-29 ENCOUNTER — Encounter: Payer: Self-pay | Admitting: Internal Medicine

## 2014-11-29 VITALS — BP 155/71 | HR 72 | Temp 97.8°F | Resp 17 | Ht 64.0 in | Wt 230.1 lb

## 2014-11-29 DIAGNOSIS — I2699 Other pulmonary embolism without acute cor pulmonale: Secondary | ICD-10-CM

## 2014-11-29 DIAGNOSIS — Z86718 Personal history of other venous thrombosis and embolism: Secondary | ICD-10-CM

## 2014-11-29 DIAGNOSIS — Z86711 Personal history of pulmonary embolism: Secondary | ICD-10-CM | POA: Diagnosis not present

## 2014-11-29 DIAGNOSIS — Z7901 Long term (current) use of anticoagulants: Secondary | ICD-10-CM | POA: Diagnosis not present

## 2014-11-29 DIAGNOSIS — I82402 Acute embolism and thrombosis of unspecified deep veins of left lower extremity: Secondary | ICD-10-CM

## 2014-11-29 LAB — COMPREHENSIVE METABOLIC PANEL (CC13)
ALT: 24 U/L (ref 0–55)
AST: 19 U/L (ref 5–34)
Albumin: 3.4 g/dL — ABNORMAL LOW (ref 3.5–5.0)
Alkaline Phosphatase: 84 U/L (ref 40–150)
Anion Gap: 7 mEq/L (ref 3–11)
BUN: 13.5 mg/dL (ref 7.0–26.0)
CALCIUM: 9.1 mg/dL (ref 8.4–10.4)
CHLORIDE: 105 meq/L (ref 98–109)
CO2: 28 meq/L (ref 22–29)
Creatinine: 0.8 mg/dL (ref 0.6–1.1)
EGFR: 67 mL/min/{1.73_m2} — AB (ref >90)
Glucose: 121 mg/dl (ref 70–140)
Potassium: 4.4 mEq/L (ref 3.5–5.1)
Sodium: 140 mEq/L (ref 136–145)
Total Bilirubin: 0.42 mg/dL (ref 0.20–1.20)
Total Protein: 7.4 g/dL (ref 6.4–8.3)

## 2014-11-29 LAB — CBC WITH DIFFERENTIAL/PLATELET
BASO%: 0.5 % (ref 0.0–2.0)
Basophils Absolute: 0 10*3/uL (ref 0.0–0.1)
EOS ABS: 0.1 10*3/uL (ref 0.0–0.5)
EOS%: 1.2 % (ref 0.0–7.0)
HCT: 44.9 % (ref 34.8–46.6)
HGB: 14.7 g/dL (ref 11.6–15.9)
LYMPH#: 2.1 10*3/uL (ref 0.9–3.3)
LYMPH%: 32 % (ref 14.0–49.7)
MCH: 29.5 pg (ref 25.1–34.0)
MCHC: 32.7 g/dL (ref 31.5–36.0)
MCV: 90.2 fL (ref 79.5–101.0)
MONO#: 1.1 10*3/uL — ABNORMAL HIGH (ref 0.1–0.9)
MONO%: 16 % — AB (ref 0.0–14.0)
NEUT%: 50.3 % (ref 38.4–76.8)
NEUTROS ABS: 3.3 10*3/uL (ref 1.5–6.5)
Platelets: 219 10*3/uL (ref 145–400)
RBC: 4.98 10*6/uL (ref 3.70–5.45)
RDW: 14.2 % (ref 11.2–14.5)
WBC: 6.6 10*3/uL (ref 3.9–10.3)

## 2014-11-29 LAB — PROTIME-INR
INR: 2.1 (ref 2.00–3.50)
PROTIME: 25.2 s — AB (ref 10.6–13.4)

## 2014-11-29 LAB — LACTATE DEHYDROGENASE (CC13): LDH: 214 U/L (ref 125–245)

## 2014-11-29 NOTE — Progress Notes (Signed)
[facility] [phone]   [phone]  OFFICE PROGRESS NOTE  [name], MD [place] [address]  DIAGNOSIS: History of deep venous thrombosis and pulmonary embolism diagnosed in 2008 was mildly elevated IgM Cardiolipin antibody  PRIOR THERAPY: None  CURRENT THERAPY: Lifelong Coumadin treatment 7.5 mg by mouth daily managed by Dr. Sharlett Iles  INTERVAL HISTORY: [name] 74 y.o. female returns to the clinic today for follow-up visit accompanied by her husband. The patient was seen in the past by Dr. Jamse Arn and Dr. Juliann Mule. She has been on treatment with Coumadin and tolerating it fairly well with no significant adverse effects, no bleeding or bruises. She came today for annual follow-up visit. She is feeling fine with no specific complaints. The patient denied having any significant chest pain, shortness of breath, cough or hemoptysis. She denied having any nausea or vomiting, no fever or chills. She denied having any significant weight loss or night sweats. She had repeat CBC, comprehensive metabolic panel, PT/INR and LDH performed earlier today and she is here for evaluation and discussion of her lab results.  MEDICAL HISTORY: Past Medical History  Diagnosis Date  . Hypertension   . PE (pulmonary embolism)   . Thyroid disease     thyroid cancer  . Cancer     thyroid  . Unspecified deficiency anemia   . OSA on CPAP     05-08-12 AHi was 46, RDI  53. titrated to   9 cm water  with 3 cm flex.-nadir 75%  . Hyperlipidemia   . Shortness of breath   . Spider bite     ALLERGIES:  is allergic to codeine; erythromycin; tetracyclines & related; statins; and latex.  MEDICATIONS:  Current Outpatient Prescriptions  Medication Sig Dispense Refill  . alendronate (FOSAMAX) 70 MG tablet Take 70 mg by mouth once a week.     . ezetimibe (ZETIA) 10 MG tablet Take 1 tablet (10 mg total) by mouth daily. 90 tablet 3  .  fluticasone (FLONASE) 50 MCG/ACT nasal spray Place 1 spray into the nose daily. For allergies.    Marland Kitchen irbesartan (AVAPRO) 300 MG tablet Take 0.5 tablets (150 mg total) by mouth at bedtime. 45 tablet 3  . levothyroxine (SYNTHROID, LEVOTHROID) 100 MCG tablet Take 100 mcg by mouth daily.  1  . LORazepam (ATIVAN) 1 MG tablet Take 0.5 mg by mouth as needed for anxiety.     . Lutein-Zeaxanthin 25-5 MG CAPS Take 1 tablet by mouth daily.     [name] (SYSTANE OP) Apply 1 drop to eye at bedtime.     . TAZTIA XT 360 MG 24 hr capsule Take 360 mg by mouth daily.     Marland Kitchen warfarin (COUMADIN) 7.5 MG tablet Take 7.5 mg by mouth daily.     No current facility-administered medications for this visit.    SURGICAL HISTORY:  Past Surgical History  Procedure Laterality Date  . Appendectomy    . Thyroid surgery    . Liver biopsy    . Abdominal hysterectomy    . Breast biopsy      REVIEW OF SYSTEMS:  A comprehensive review of systems was negative.   PHYSICAL EXAMINATION: General appearance: alert, cooperative and no distress Head: Normocephalic, without obvious abnormality, atraumatic Neck: no adenopathy, no JVD, supple, symmetrical, trachea midline and thyroid not enlarged, symmetric, no tenderness/mass/nodules Lymph nodes: Cervical, supraclavicular, and axillary nodes normal. Resp: clear to auscultation bilaterally Back: symmetric,  no curvature. ROM normal. No CVA tenderness. Cardio: regular rate and rhythm, S1, S2 normal, no murmur, click, rub or gallop GI: soft, non-tender; bowel sounds normal; no masses,  no organomegaly Extremities: extremities normal, atraumatic, no cyanosis or edema  ECOG PERFORMANCE STATUS: 0 - Asymptomatic  Blood pressure 155/71, pulse 72, temperature 97.8 F (36.6 C), temperature source Oral, resp. rate 17, height 5\' 4"  (1.626 m), weight 230 lb 1.6 oz (104.373 kg), SpO2 94 %.  LABORATORY DATA: Lab Results  Component Value Date   WBC 6.6 11/29/2014    HGB 14.7 11/29/2014   HCT 44.9 11/29/2014   MCV 90.2 11/29/2014   PLT 219 11/29/2014      Chemistry      Component Value Date/Time   NA 140 11/29/2014 0839   NA 137 03/07/2012 1015   K 4.4 11/29/2014 0839   K 4.0 03/07/2012 1015   CL 104 04/29/2012 1145   CL 102 03/07/2012 1015   CO2 28 11/29/2014 0839   CO2 25 03/07/2012 1015   BUN 13.5 11/29/2014 0839   BUN 14 03/07/2012 1015   CREATININE 0.8 11/29/2014 0839   CREATININE 0.89 03/07/2012 1015      Component Value Date/Time   CALCIUM 9.1 11/29/2014 0839   CALCIUM 9.1 03/07/2012 1015   ALKPHOS 84 11/29/2014 0839   ALKPHOS 113 03/16/2011 1819   AST 19 11/29/2014 0839   AST 21 03/16/2011 1819   ALT 24 11/29/2014 0839   ALT 19 03/16/2011 1819   BILITOT 0.42 11/29/2014 0839   BILITOT 0.3 03/16/2011 1819       RADIOGRAPHIC STUDIES: No results found.  ASSESSMENT AND PLAN: This is a very pleasant 74 years old white female with history of deep venous thrombosis and pulmonary embolism diagnosed several years ago with hypercoagulable abnormality in the form of mildly elevated Cardiolipin IgM antibody. She is currently on treatment with Coumadin managed by her primary care physician Dr. Sharlett Iles. Her lab work is unremarkable today and her PT/INR are within the acceptable range. I discussed the lab result with the patient and her husband. I recommended for her to continue with her current treatment and Coumadin as prescribed by Dr. Sharlett Iles. I don't see a need for the patient to continue routine follow-up visit at the [facility] at this point but will be happy to see her in the future if needed. The patient was advised to call if she has any concerning symptoms. The patient voices understanding of current disease status and treatment options and is in agreement with the current care plan.  All questions were answered. The patient knows to call the clinic with any problems, questions or concerns. We can certainly see the  patient much sooner if necessary.  I spent 20 minutes counseling the patient face to face. The total time spent in the appointment was 30 minutes.  Disclaimer: This note was dictated with voice recognition software. Similar sounding words can inadvertently be transcribed and may not be corrected upon review.

## 2014-12-01 ENCOUNTER — Other Ambulatory Visit: Payer: Medicare Other

## 2014-12-01 ENCOUNTER — Ambulatory Visit: Payer: Medicare Other

## 2015-01-30 ENCOUNTER — Other Ambulatory Visit: Payer: Self-pay

## 2015-01-30 DIAGNOSIS — Z1231 Encounter for screening mammogram for malignant neoplasm of breast: Secondary | ICD-10-CM

## 2015-03-08 ENCOUNTER — Ambulatory Visit
Admission: RE | Admit: 2015-03-08 | Discharge: 2015-03-08 | Disposition: A | Discharge status: Home / Self Care | Payer: Medicare Other | Source: Ambulatory Visit

## 2015-03-08 DIAGNOSIS — Z1231 Encounter for screening mammogram for malignant neoplasm of breast: Secondary | ICD-10-CM

## 2015-03-13 ENCOUNTER — Other Ambulatory Visit: Payer: Self-pay | Admitting: Internal Medicine

## 2015-03-13 DIAGNOSIS — R928 Other abnormal and inconclusive findings on diagnostic imaging of breast: Secondary | ICD-10-CM

## 2015-03-21 ENCOUNTER — Other Ambulatory Visit: Payer: Self-pay | Admitting: Internal Medicine

## 2015-03-21 ENCOUNTER — Ambulatory Visit
Admission: RE | Admit: 2015-03-21 | Discharge: 2015-03-21 | Disposition: A | Discharge status: Home / Self Care | Payer: Medicare Other | Source: Ambulatory Visit | Attending: Internal Medicine | Admitting: Internal Medicine

## 2015-03-21 DIAGNOSIS — R928 Other abnormal and inconclusive findings on diagnostic imaging of breast: Secondary | ICD-10-CM

## 2015-03-21 DIAGNOSIS — N6320 Unspecified lump in the left breast, unspecified quadrant: Secondary | ICD-10-CM

## 2015-04-24 ENCOUNTER — Other Ambulatory Visit: Payer: Self-pay | Admitting: Internal Medicine

## 2015-04-24 DIAGNOSIS — N6320 Unspecified lump in the left breast, unspecified quadrant: Secondary | ICD-10-CM

## 2015-04-25 ENCOUNTER — Other Ambulatory Visit: Payer: Self-pay | Admitting: Internal Medicine

## 2015-04-25 ENCOUNTER — Ambulatory Visit
Admission: RE | Admit: 2015-04-25 | Discharge: 2015-04-25 | Disposition: A | Discharge status: Home / Self Care | Payer: Medicare Other | Source: Ambulatory Visit | Attending: Internal Medicine | Admitting: Internal Medicine

## 2015-04-25 DIAGNOSIS — N6320 Unspecified lump in the left breast, unspecified quadrant: Secondary | ICD-10-CM

## 2015-05-10 DIAGNOSIS — Z8709 Personal history of other diseases of the respiratory system: Secondary | ICD-10-CM

## 2015-05-10 HISTORY — DX: Personal history of other diseases of the respiratory system: Z87.09

## 2015-05-24 ENCOUNTER — Encounter: Payer: Self-pay | Admitting: Adult Health

## 2015-05-24 ENCOUNTER — Ambulatory Visit (INDEPENDENT_AMBULATORY_CARE_PROVIDER_SITE_OTHER): Payer: Medicare Other | Admitting: Adult Health

## 2015-05-24 VITALS — BP 152/82 | HR 75 | Resp 20 | Ht 64.0 in | Wt 230.0 lb

## 2015-05-24 DIAGNOSIS — G4733 Obstructive sleep apnea (adult) (pediatric): Secondary | ICD-10-CM

## 2015-05-24 DIAGNOSIS — Z9989 Dependence on other enabling machines and devices: Principal | ICD-10-CM

## 2015-05-24 NOTE — Progress Notes (Signed)
PATIENT: [name] DOB: 10/03/1940  REASON FOR VISIT: follow up- obstructive sleep apnea HISTORY FROM: patient  HISTORY OF PRESENT ILLNESS: Ms. [name] is a 74 year old female with a history of obstructive sleep apnea on CPAP. She returns today for a compliance download. The patient states that she has been sick for the last 3 weeks. She was on prednisone and now is on an antibiotic. For that reason she hass not use her CPAP in the last 2 weeks. The patient's CPAP download indicates that she used her machine 70 out of 90 days for compliance of 78%.  She used her machine greater than 4 hours 70 out of 90 days for compliance of 70%. She uses her machine 6 hours and 35 minutes each night. Her residual AHI is 0.6 on 9 cm of water. The patient leak in the 95th percentile at 29.6 L/m. The patient states that overall she is very pleased with the CPAP. She feels that it is beneficial for her sleep. Other than being sick she typically uses her machine every night. The patient is interested in different straps. She denies any new neurological symptoms. She returns today for an evaluation.  HISTORY 05/24/14: [name] is a 74 y.o. female seen here as a yearly rv patient , originally referred by Dr. Philip Aspen , now seen for compliance on CPAP therapy.    Interval history : [name] is seen here today for regular yearly revisit. She is as usual a happy individual well groomed and optimistic. She is looking forward to the holidays. Today's fatigue severity score was endorsed at 18 points her sleepiness score at 4 points.  The patient's download from her CPAP machine shows an 84% compliance over the last 90 days. Due to an upper respiratory infection she was unable from November 16 through November 29 to use the machine and she had phlegm and other discharge that prohibited the use of nasal CPAP. With his medical extension she still has an 84% compliance and an average use  the time of 6 hours and 35 minutes. Set pressure is 9 cm water without EPR her residual AHI is 0.9 which is excellent. The patient had a productive cough and probably bronchitis as well as sinusitis. She felt more often lightheaded." swimmy headed " when moving her head quickly. BP may have been lower or oxygen was low.  She has used Mucinex and has taken lots of fluids which helped her to break down the phlegm and she is now able to breathe unrestricted again and can use the CPAP. Her sleep habits are unchanged but she reports that she dreams more when she uses a decongestant. That is affected is well known. I will order an ONO on oxygen and on CPAP to confirm she has sufficient supplementation  REVIEW OF SYSTEMS: Out of a complete 14 system review of symptoms, the patient complains only of the following symptoms, and all other reviewed systems are negative.  Cough, wheezing, eye discharge  ALLERGIES: Allergies  Allergen Reactions  . Codeine Other (See Comments)    Causes hyperactivity  . Erythromycin Itching and Other (See Comments)    Also with jitters  . Tetracyclines & Related Itching    Also with jitters  . Statins Other (See Comments)    Leg muscle weakness  . Latex Rash    HOME MEDICATIONS: Outpatient Prescriptions Prior to Visit  Medication Sig Dispense Refill  . alendronate (FOSAMAX) 70 MG tablet Take 70 mg by mouth once  a week.     . ezetimibe (ZETIA) 10 MG tablet Take 1 tablet (10 mg total) by mouth daily. 90 tablet 3  . fluticasone (FLONASE) 50 MCG/ACT nasal spray Place 1 spray into the nose daily. For allergies.    Marland Kitchen irbesartan (AVAPRO) 300 MG tablet Take 0.5 tablets (150 mg total) by mouth at bedtime. 45 tablet 3  . levothyroxine (SYNTHROID, LEVOTHROID) 100 MCG tablet Take 100 mcg by mouth daily.  1  . LORazepam (ATIVAN) 1 MG tablet Take 0.5 mg by mouth as needed for anxiety.     . Lutein-Zeaxanthin 25-5 MG CAPS Take 1 tablet by mouth daily.     [name]  Glycol-Propyl Glycol (SYSTANE OP) Apply 1 drop to eye at bedtime.     . TAZTIA XT 360 MG 24 hr capsule Take 360 mg by mouth daily.     Marland Kitchen warfarin (COUMADIN) 7.5 MG tablet Take 7.5 mg by mouth daily.     No facility-administered medications prior to visit.    PAST MEDICAL HISTORY: Past Medical History  Diagnosis Date  . Hypertension   . PE (pulmonary embolism)   . Thyroid disease     thyroid cancer  . Cancer ([place])     thyroid  . Unspecified deficiency anemia   . OSA on CPAP     05-08-12 AHi was 46, RDI  53. titrated to   9 cm water  with 3 cm flex.-nadir 75%  . Hyperlipidemia   . Shortness of breath   . Spider bite     PAST SURGICAL HISTORY: Past Surgical History  Procedure Laterality Date  . Appendectomy    . Thyroid surgery    . Liver biopsy    . Abdominal hysterectomy    . Breast biopsy      FAMILY HISTORY: Family History  Problem Relation Age of Onset  . CVA Mother     SOCIAL HISTORY: Social History   Social History  . Marital Status: Married    Spouse Name: N/A  . Number of Children: N/A  . Years of Education: N/A   Occupational History  . Retired    Social History Main Topics  . Smoking status: Never Smoker   . Smokeless tobacco: Never Used  . Alcohol Use: No  . Drug Use: No  . Sexual Activity: Not Currently   Other Topics Concern  . Not on file   Social History Narrative      PHYSICAL EXAM  Filed Vitals:   05/24/15 0719  BP: 152/82  Pulse: 75  Resp: 20  Height: 5\' 4"  (1.626 m)  Weight: 230 lb (104.327 kg)   Body mass index is 39.46 kg/(m^2).  Generalized: Well developed, in no acute distress Neck: Circumference 17 inches, Mallampati 3+   Neurological examination  Mentation: Alert oriented to time, place, history taking. Follows all commands speech and language fluent Cranial nerve II-XII: Pupils were equal round reactive to light. Extraocular movements were full, visual field were full on confrontational test. Facial sensation  and strength were normal. Uvula tongue midline. Head turning and shoulder shrug  were normal and symmetric. Motor: The motor testing reveals 5 over 5 strength of all 4 extremities. Good symmetric motor tone is noted throughout.  Sensory: Sensory testing is intact to soft touch on all 4 extremities. No evidence of extinction is noted.  Coordination: Cerebellar testing reveals good finger-nose-finger and heel-to-shin bilaterally.  Gait and station: Gait is normal. Tandem gait is normal. Romberg is negative. No drift is seen.  DIAGNOSTIC DATA (LABS, IMAGING, TESTING) - I reviewed patient records, labs, notes, testing and imaging myself where available.  Lab Results  Component Value Date   WBC 6.6 11/29/2014   HGB 14.7 11/29/2014   HCT 44.9 11/29/2014   MCV 90.2 11/29/2014   PLT 219 11/29/2014      Component Value Date/Time   NA 140 11/29/2014 0839   NA 137 03/07/2012 1015   K 4.4 11/29/2014 0839   K 4.0 03/07/2012 1015   CL 104 04/29/2012 1145   CL 102 03/07/2012 1015   CO2 28 11/29/2014 0839   CO2 25 03/07/2012 1015   GLUCOSE 121 11/29/2014 0839   GLUCOSE 93 04/29/2012 1145   GLUCOSE 105* 03/07/2012 1015   BUN 13.5 11/29/2014 0839   BUN 14 03/07/2012 1015   CREATININE 0.8 11/29/2014 0839   CREATININE 0.89 03/07/2012 1015   CALCIUM 9.1 11/29/2014 0839   CALCIUM 9.1 03/07/2012 1015   PROT 7.4 11/29/2014 0839   PROT 8.1 03/16/2011 1819   ALBUMIN 3.4* 11/29/2014 0839   ALBUMIN 3.9 03/16/2011 1819   AST 19 11/29/2014 0839   AST 21 03/16/2011 1819   ALT 24 11/29/2014 0839   ALT 19 03/16/2011 1819   ALKPHOS 84 11/29/2014 0839   ALKPHOS 113 03/16/2011 1819   BILITOT 0.42 11/29/2014 0839   BILITOT 0.3 03/16/2011 1819   GFRNONAA 63* 03/07/2012 1015   GFRAA 73* 03/07/2012 1015   No results found for: CHOL, HDL, LDLCALC, LDLDIRECT, TRIG, CHOLHDL No results found for: HGBA1C No results found for: VITAMINB12 Lab Results  Component Value Date   TSH 2.500 05/24/2014       ASSESSMENT AND PLAN 74 y.o. year old female  has a past medical history of Hypertension; PE (pulmonary embolism); Thyroid disease; Cancer ([place]); Unspecified deficiency anemia; OSA on CPAP; Hyperlipidemia; Shortness of breath; and Spider bite. here with:  1. Obstructive sleep apnea on CPAP  Overall the patient's compliance is excellent. She does have a leak but it is not affected her AHI. Patient is interested in trying the dreamwear mask. She will stop by  sleep lab and try on this mask to see if it is something she is interested in. Patient encouraged to continue using her CPAP nightly. If her symptoms worsen or she develops any new symptoms she should let us know she will follow-up in one year or sooner  I spent 15 minutes with the patient 50% of this time was spent counseling the patient on her CPAP and different masks.  Ward Givens, MSN, NP-C 05/24/2015, 7:26 AM [facility] [address], [place], [place] [address] [phone]

## 2015-05-24 NOTE — Patient Instructions (Signed)
Continue Using CPAP nightly  If your symptoms worsen or you develop new symptoms please let us know.

## 2015-05-24 NOTE — Progress Notes (Signed)
I agree with the assessment and plan as directed by NP .The patient is known to me .   Stevan Eberwein, MD  

## 2015-05-25 ENCOUNTER — Ambulatory Visit: Payer: Medicare Other | Admitting: Adult Health

## 2015-10-19 ENCOUNTER — Other Ambulatory Visit: Payer: Self-pay | Admitting: Internal Medicine

## 2015-10-19 DIAGNOSIS — R1011 Right upper quadrant pain: Secondary | ICD-10-CM

## 2015-10-25 ENCOUNTER — Ambulatory Visit
Admission: RE | Admit: 2015-10-25 | Discharge: 2015-10-25 | Disposition: A | Discharge status: Home / Self Care | Payer: Medicare Other | Source: Ambulatory Visit | Attending: Internal Medicine | Admitting: Internal Medicine

## 2015-10-25 DIAGNOSIS — R1011 Right upper quadrant pain: Secondary | ICD-10-CM

## 2015-11-01 ENCOUNTER — Other Ambulatory Visit: Payer: Self-pay | Admitting: Internal Medicine

## 2015-11-01 DIAGNOSIS — R112 Nausea with vomiting, unspecified: Secondary | ICD-10-CM

## 2015-11-08 ENCOUNTER — Ambulatory Visit (INDEPENDENT_AMBULATORY_CARE_PROVIDER_SITE_OTHER): Payer: Medicare Other | Admitting: Ophthalmology

## 2015-11-08 DIAGNOSIS — H35372 Puckering of macula, left eye: Secondary | ICD-10-CM | POA: Diagnosis not present

## 2015-11-08 DIAGNOSIS — H43813 Vitreous degeneration, bilateral: Secondary | ICD-10-CM | POA: Diagnosis not present

## 2015-11-08 DIAGNOSIS — I10 Essential (primary) hypertension: Secondary | ICD-10-CM | POA: Diagnosis not present

## 2015-11-08 DIAGNOSIS — H2513 Age-related nuclear cataract, bilateral: Secondary | ICD-10-CM

## 2015-11-08 DIAGNOSIS — H35033 Hypertensive retinopathy, bilateral: Secondary | ICD-10-CM

## 2015-11-08 DIAGNOSIS — H353132 Nonexudative age-related macular degeneration, bilateral, intermediate dry stage: Secondary | ICD-10-CM

## 2015-11-14 ENCOUNTER — Ambulatory Visit (HOSPITAL_COMMUNITY)
Admission: RE | Admit: 2015-11-14 | Discharge: 2015-11-14 | Disposition: A | Discharge status: Home / Self Care | Payer: Medicare Other | Source: Ambulatory Visit | Attending: Internal Medicine | Admitting: Internal Medicine

## 2015-11-14 DIAGNOSIS — R112 Nausea with vomiting, unspecified: Secondary | ICD-10-CM | POA: Diagnosis present

## 2015-11-14 DIAGNOSIS — K828 Other specified diseases of gallbladder: Secondary | ICD-10-CM | POA: Insufficient documentation

## 2015-11-14 MED ORDER — TECHNETIUM TC 99M MEBROFENIN IV KIT
5.2000 | PACK | Freq: Once | INTRAVENOUS | Status: AC | PRN
  Administered 2015-11-14: 5 via INTRAVENOUS

## 2015-11-20 ENCOUNTER — Other Ambulatory Visit: Payer: Self-pay | Admitting: General Surgery

## 2015-11-27 NOTE — Pre-Procedure Instructions (Signed)
[name]  11/27/2015      [facility] [place], [place] - [address] [place] [place] [address] Phone: [phone] Fax: [phone]    Your procedure is scheduled on Thurs, June 22 @ 7:30 AM  Report to [facility] Admitting at 5:30 AM  Call this number if you have problems the morning of surgery:  [phone]   Remember:  Do not eat food or drink liquids after midnight.  Take these medicines the morning of surgery with A SIP OF WATER Diltiazem(Tiazac),Flonase(Fluticasone),Synthroid(Levothyroxine),and Ativan(Lorazepam)               No Goody's,BC's,Aleve,Advil,Motrin,Ibuprofen,Fish Oil,or any Herbal Medications.    Do not wear jewelry, make-up or nail polish.  Do not wear lotions, powders, or perfumes.   Do not shave 48 hours prior to surgery.   Do not bring valuables to the hospital.  [facility] is not responsible for any belongings or valuables.  Contacts, dentures or bridgework may not be worn into surgery.  Leave your suitcase in the car.  After surgery it may be brought to your room.  For patients admitted to the hospital, discharge time will be determined by your treatment team.  Patients discharged the day of surgery will not be allowed to drive home.    Special instructions:[facility] - Preparing for Surgery  Before surgery, you can play an important role.  Because skin is not sterile, your skin needs to be as free of germs as possible.  You can reduce the number of germs on you skin by washing with CHG (chlorahexidine gluconate) soap before surgery.  CHG is an antiseptic cleaner which kills germs and bonds with the skin to continue killing germs even after washing.  Please DO NOT use if you have an allergy to CHG or antibacterial soaps.  If your skin becomes reddened/irritated stop using the CHG and inform your nurse when you arrive at Short Stay.  Do not shave (including legs and underarms) for  at least 48 hours prior to the first CHG shower.  You may shave your face.  Please follow these instructions carefully:   1.  Shower with CHG Soap the night before surgery and the                                morning of Surgery.  2.  If you choose to wash your hair, wash your hair first as usual with your       normal shampoo.  3.  After you shampoo, rinse your hair and body thoroughly to remove the                      Shampoo.  4.  Use CHG as you would any other liquid soap.  You can apply chg directly       to the skin and wash gently with scrungie or a clean washcloth.  5.  Apply the CHG Soap to your body ONLY FROM THE NECK DOWN.        Do not use on open wounds or open sores.  Avoid contact with your eyes,       ears, mouth and genitals (private parts).  Wash genitals (private parts)       with your normal soap.  6.  Wash thoroughly, paying special attention to the area where your surgery  will be performed.  7.  Thoroughly rinse your body with warm water from the neck down.  8.  DO NOT shower/wash with your normal soap after using and rinsing off       the CHG Soap.  9.  Pat yourself dry with a clean towel.            10.  Wear clean pajamas.            11.  Place clean sheets on your bed the night of your first shower and do not        sleep with pets.  Day of Surgery  Do not apply any lotions/deoderants the morning of surgery.  Please wear clean clothes to the hospital/surgery center.      Please read over the following fact sheets that you were given. Coughing and Deep Breathing

## 2015-11-28 ENCOUNTER — Encounter (HOSPITAL_COMMUNITY)
Admission: RE | Admit: 2015-11-28 | Discharge: 2015-11-28 | Disposition: A | Discharge status: Home / Self Care | Payer: Medicare Other | Source: Ambulatory Visit | Attending: General Surgery | Admitting: General Surgery

## 2015-11-28 ENCOUNTER — Other Ambulatory Visit: Payer: Self-pay

## 2015-11-28 ENCOUNTER — Encounter (HOSPITAL_COMMUNITY): Payer: Self-pay

## 2015-11-28 DIAGNOSIS — G4730 Sleep apnea, unspecified: Secondary | ICD-10-CM | POA: Diagnosis not present

## 2015-11-28 DIAGNOSIS — Z7901 Long term (current) use of anticoagulants: Secondary | ICD-10-CM | POA: Diagnosis not present

## 2015-11-28 DIAGNOSIS — E039 Hypothyroidism, unspecified: Secondary | ICD-10-CM | POA: Diagnosis not present

## 2015-11-28 DIAGNOSIS — F419 Anxiety disorder, unspecified: Secondary | ICD-10-CM | POA: Diagnosis not present

## 2015-11-28 DIAGNOSIS — I82509 Chronic embolism and thrombosis of unspecified deep veins of unspecified lower extremity: Secondary | ICD-10-CM | POA: Diagnosis not present

## 2015-11-28 DIAGNOSIS — I10 Essential (primary) hypertension: Secondary | ICD-10-CM | POA: Diagnosis not present

## 2015-11-28 DIAGNOSIS — Z6841 Body Mass Index (BMI) 40.0 and over, adult: Secondary | ICD-10-CM | POA: Diagnosis not present

## 2015-11-28 DIAGNOSIS — K8010 Calculus of gallbladder with chronic cholecystitis without obstruction: Secondary | ICD-10-CM | POA: Diagnosis present

## 2015-11-28 DIAGNOSIS — I2782 Chronic pulmonary embolism: Secondary | ICD-10-CM | POA: Diagnosis not present

## 2015-11-28 HISTORY — DX: Weakness: R53.1

## 2015-11-28 HISTORY — DX: Effusion, unspecified joint: M25.40

## 2015-11-28 HISTORY — DX: Vascular disorder of intestine, unspecified: K55.9

## 2015-11-28 HISTORY — DX: Personal history of colonic polyps: Z86.010

## 2015-11-28 HISTORY — DX: Anxiety disorder, unspecified: F41.9

## 2015-11-28 HISTORY — DX: Edema, unspecified: R60.9

## 2015-11-28 HISTORY — DX: Personal history of other diseases of the respiratory system: Z87.09

## 2015-11-28 HISTORY — DX: Family history of other specified conditions: Z84.89

## 2015-11-28 HISTORY — DX: Personal history of other medical treatment: Z92.89

## 2015-11-28 HISTORY — DX: Pneumonia, unspecified organism: J18.9

## 2015-11-28 HISTORY — DX: Other chronic pain: G89.29

## 2015-11-28 HISTORY — DX: Personal history of colon polyps, unspecified: Z86.0100

## 2015-11-28 HISTORY — DX: Hypothyroidism, unspecified: E03.9

## 2015-11-28 HISTORY — DX: Wheezing: R06.2

## 2015-11-28 HISTORY — DX: Unspecified cataract: H26.9

## 2015-11-28 HISTORY — DX: Pain in unspecified joint: M25.50

## 2015-11-28 HISTORY — DX: Dorsalgia, unspecified: M54.9

## 2015-11-28 HISTORY — DX: Sarcoidosis, unspecified: D86.9

## 2015-11-28 HISTORY — DX: Unspecified osteoarthritis, unspecified site: M19.90

## 2015-11-28 HISTORY — DX: Localized edema: R60.0

## 2015-11-28 HISTORY — DX: Frequency of micturition: R35.0

## 2015-11-28 LAB — BASIC METABOLIC PANEL
ANION GAP: 7 (ref 5–15)
BUN: 12 mg/dL (ref 6–20)
CO2: 26 mmol/L (ref 22–32)
Calcium: 9.5 mg/dL (ref 8.9–10.3)
Chloride: 108 mmol/L (ref 101–111)
Creatinine, Ser: 0.79 mg/dL (ref 0.44–1.00)
GFR calc Af Amer: >60 mL/min (ref >60)
GFR calc non Af Amer: >60 mL/min (ref >60)
GLUCOSE: 97 mg/dL (ref 65–99)
POTASSIUM: 3.9 mmol/L (ref 3.5–5.1)
Sodium: 141 mmol/L (ref 135–145)

## 2015-11-28 LAB — URINALYSIS, ROUTINE W REFLEX MICROSCOPIC
Bilirubin Urine: NEGATIVE
GLUCOSE, UA: NEGATIVE mg/dL
Hgb urine dipstick: NEGATIVE
Ketones, ur: NEGATIVE mg/dL
Nitrite: NEGATIVE
PH: 7 (ref 5.0–8.0)
PROTEIN: NEGATIVE mg/dL
SPECIFIC GRAVITY, URINE: 1.009 (ref 1.005–1.030)

## 2015-11-28 LAB — PROTIME-INR
INR: 1.17 (ref 0.00–1.49)
Prothrombin Time: 15.1 seconds (ref 11.6–15.2)

## 2015-11-28 LAB — CBC
HEMATOCRIT: 44.2 % (ref 36.0–46.0)
HEMOGLOBIN: 14.4 g/dL (ref 12.0–15.0)
MCH: 28.9 pg (ref 26.0–34.0)
MCHC: 32.6 g/dL (ref 30.0–36.0)
MCV: 88.6 fL (ref 78.0–100.0)
Platelets: 239 10*3/uL (ref 150–400)
RBC: 4.99 MIL/uL (ref 3.87–5.11)
RDW: 14.5 % (ref 11.5–15.5)
WBC: 7.9 10*3/uL (ref 4.0–10.5)

## 2015-11-28 LAB — URINE MICROSCOPIC-ADD ON
BACTERIA UA: NONE SEEN
RBC / HPF: NONE SEEN RBC/hpf (ref 0–5)

## 2015-11-28 LAB — APTT: aPTT: 28 seconds (ref 24–37)

## 2015-11-28 MED ORDER — CHLORHEXIDINE GLUCONATE 4 % EX LIQD: 60.0000 mL | Freq: Once | CUTANEOUS | Status: DC

## 2015-11-28 MED ORDER — ACETAMINOPHEN 500 MG PO TABS
1000.0000 mg | ORAL_TABLET | ORAL | Status: AC
  Administered 2015-11-29: 1000 mg via ORAL
  Filled 2015-11-28: qty 2

## 2015-11-28 MED ORDER — GABAPENTIN 300 MG PO CAPS
300.0000 mg | ORAL_CAPSULE | ORAL | Status: AC
  Administered 2015-11-29: 300 mg via ORAL
  Filled 2015-11-28: qty 1

## 2015-11-28 MED ORDER — CEFAZOLIN SODIUM-DEXTROSE 2-4 GM/100ML-% IV SOLN
2.0000 g | INTRAVENOUS | Status: AC
  Administered 2015-11-29: 2 g via INTRAVENOUS
  Filled 2015-11-28: qty 100

## 2015-11-28 NOTE — Progress Notes (Addendum)
Cardiologist is Dr.Varanasi,with last visit in epic from 10/25/14. Pt states [place] doesn't need to see her unless problems.  Medical Md is Dr.Daniel Philip Aspen  Echo reports in epic from 2009/2013  Stress test done in 2008 and another > 5 yrs ago  Heart cath denies  EKG denies in past yr  CXR to be requested from [facility]  Sleep study under media tab from 2015

## 2015-11-28 NOTE — Anesthesia Preprocedure Evaluation (Addendum)
Anesthesia Evaluation  Patient identified by MRN, date of birth, ID band Patient awake    Reviewed: Allergy & Precautions, NPO status , Patient's Chart, lab work & pertinent test results  Airway Mallampati: II  TM Distance: >3 FB Neck ROM: Full    Dental  (+) Dental Advisory Given   Pulmonary sleep apnea ,    breath sounds clear to auscultation       Cardiovascular hypertension, Pt. on medications  Rhythm:Regular Rate:Normal  2013 Echo: EF 60-65%. Valves okay   Neuro/Psych Anxiety negative neurological ROS     GI/Hepatic negative GI ROS, Neg liver ROS,   Endo/Other  Hypothyroidism Morbid obesity  Renal/GU negative Renal ROS     Musculoskeletal   Abdominal   Peds  Hematology  (+) Blood dyscrasia (Hx PE on coumadin), ,   Anesthesia Other Findings   Reproductive/Obstetrics                            Lab Results  Component Value Date   WBC 7.9 11/28/2015   HGB 14.4 11/28/2015   HCT 44.2 11/28/2015   MCV 88.6 11/28/2015   PLT 239 11/28/2015   Lab Results  Component Value Date   CREATININE 0.79 11/28/2015   BUN 12 11/28/2015   NA 141 11/28/2015   K 3.9 11/28/2015   CL 108 11/28/2015   CO2 26 11/28/2015    Anesthesia Physical Anesthesia Plan  ASA: III  Anesthesia Plan: General   Post-op Pain Management:    Induction: Intravenous  Airway Management Planned: Oral ETT  Additional Equipment:   Intra-op Plan:   Post-operative Plan: Extubation in OR  Informed Consent: I have reviewed the patients History and Physical, chart, labs and discussed the procedure including the risks, benefits and alternatives for the proposed anesthesia with the patient or authorized representative who has indicated his/her understanding and acceptance.   Dental advisory given  Plan Discussed with: CRNA  Anesthesia Plan Comments:        Anesthesia Quick Evaluation

## 2015-11-28 NOTE — Progress Notes (Signed)
Last dose of Coumadin was 11/23/15. Started Lovenox on 11/26/15

## 2015-11-29 ENCOUNTER — Encounter (HOSPITAL_COMMUNITY)
Admission: RE | Disposition: A | Discharge status: Home / Self Care | Payer: Self-pay | Source: Ambulatory Visit | Attending: General Surgery

## 2015-11-29 ENCOUNTER — Ambulatory Visit (HOSPITAL_COMMUNITY): Payer: Medicare Other | Admitting: Certified Registered"

## 2015-11-29 ENCOUNTER — Ambulatory Visit (HOSPITAL_COMMUNITY)
Admission: RE | Admit: 2015-11-29 | Discharge: 2015-11-29 | Disposition: A | Discharge status: Home / Self Care | Payer: Medicare Other | Source: Ambulatory Visit | Attending: General Surgery | Admitting: General Surgery

## 2015-11-29 DIAGNOSIS — Z7901 Long term (current) use of anticoagulants: Secondary | ICD-10-CM | POA: Diagnosis not present

## 2015-11-29 DIAGNOSIS — Z6841 Body Mass Index (BMI) 40.0 and over, adult: Secondary | ICD-10-CM | POA: Insufficient documentation

## 2015-11-29 DIAGNOSIS — I10 Essential (primary) hypertension: Secondary | ICD-10-CM | POA: Insufficient documentation

## 2015-11-29 DIAGNOSIS — I82509 Chronic embolism and thrombosis of unspecified deep veins of unspecified lower extremity: Secondary | ICD-10-CM | POA: Insufficient documentation

## 2015-11-29 DIAGNOSIS — G4730 Sleep apnea, unspecified: Secondary | ICD-10-CM | POA: Insufficient documentation

## 2015-11-29 DIAGNOSIS — K8010 Calculus of gallbladder with chronic cholecystitis without obstruction: Secondary | ICD-10-CM | POA: Insufficient documentation

## 2015-11-29 DIAGNOSIS — I2782 Chronic pulmonary embolism: Secondary | ICD-10-CM | POA: Diagnosis not present

## 2015-11-29 DIAGNOSIS — F419 Anxiety disorder, unspecified: Secondary | ICD-10-CM | POA: Insufficient documentation

## 2015-11-29 DIAGNOSIS — E039 Hypothyroidism, unspecified: Secondary | ICD-10-CM | POA: Insufficient documentation

## 2015-11-29 HISTORY — PX: CHOLECYSTECTOMY: SHX55

## 2015-11-29 SURGERY — LAPAROSCOPIC CHOLECYSTECTOMY
Anesthesia: General | Site: Abdomen

## 2015-11-29 MED ORDER — SUCCINYLCHOLINE CHLORIDE 200 MG/10ML IV SOSY
PREFILLED_SYRINGE | INTRAVENOUS | Status: AC
  Filled 2015-11-29: qty 10

## 2015-11-29 MED ORDER — PROPOFOL 10 MG/ML IV BOLUS
INTRAVENOUS | Status: AC
  Filled 2015-11-29: qty 20

## 2015-11-29 MED ORDER — SODIUM CHLORIDE 0.9 % IR SOLN
Status: DC | PRN
  Administered 2015-11-29: 1000 mL

## 2015-11-29 MED ORDER — EPHEDRINE SULFATE 50 MG/ML IJ SOLN
INTRAMUSCULAR | Status: DC | PRN
  Administered 2015-11-29: 10 mg via INTRAVENOUS

## 2015-11-29 MED ORDER — 0.9 % SODIUM CHLORIDE (POUR BTL) OPTIME
TOPICAL | Status: DC | PRN
  Administered 2015-11-29 (×2): 1000 mL

## 2015-11-29 MED ORDER — DEXAMETHASONE SODIUM PHOSPHATE 10 MG/ML IJ SOLN
INTRAMUSCULAR | Status: DC | PRN
  Administered 2015-11-29: 5 mg via INTRAVENOUS

## 2015-11-29 MED ORDER — PROMETHAZINE HCL 25 MG/ML IJ SOLN: 6.2500 mg | INTRAMUSCULAR | Status: DC | PRN

## 2015-11-29 MED ORDER — MIDAZOLAM HCL 2 MG/2ML IJ SOLN
INTRAMUSCULAR | Status: AC
  Filled 2015-11-29: qty 2

## 2015-11-29 MED ORDER — LIDOCAINE HCL (PF) 1 % IJ SOLN
INTRAMUSCULAR | Status: AC
  Filled 2015-11-29: qty 30

## 2015-11-29 MED ORDER — ROCURONIUM BROMIDE 100 MG/10ML IV SOLN
INTRAVENOUS | Status: DC | PRN
  Administered 2015-11-29: 20 mg via INTRAVENOUS
  Administered 2015-11-29: 10 mg via INTRAVENOUS

## 2015-11-29 MED ORDER — ACETAMINOPHEN 325 MG PO TABS: 650.0000 mg | ORAL_TABLET | ORAL | Status: DC | PRN

## 2015-11-29 MED ORDER — FENTANYL CITRATE (PF) 250 MCG/5ML IJ SOLN
INTRAMUSCULAR | Status: AC
  Filled 2015-11-29: qty 5

## 2015-11-29 MED ORDER — LIDOCAINE HCL 1 % IJ SOLN
INTRAMUSCULAR | Status: DC | PRN
  Administered 2015-11-29: 9 mL

## 2015-11-29 MED ORDER — FENTANYL CITRATE (PF) 100 MCG/2ML IJ SOLN
INTRAMUSCULAR | Status: AC
  Administered 2015-11-29: 25 ug via INTRAVENOUS
  Filled 2015-11-29: qty 2

## 2015-11-29 MED ORDER — IOPAMIDOL (ISOVUE-300) INJECTION 61%
INTRAVENOUS | Status: AC
  Filled 2015-11-29: qty 50

## 2015-11-29 MED ORDER — LIDOCAINE 2% (20 MG/ML) 5 ML SYRINGE
INTRAMUSCULAR | Status: AC
  Filled 2015-11-29: qty 5

## 2015-11-29 MED ORDER — ONDANSETRON HCL 4 MG/2ML IJ SOLN
INTRAMUSCULAR | Status: AC
  Filled 2015-11-29: qty 2

## 2015-11-29 MED ORDER — LIDOCAINE HCL (CARDIAC) 20 MG/ML IV SOLN
INTRAVENOUS | Status: DC | PRN
  Administered 2015-11-29: 80 mg via INTRAVENOUS

## 2015-11-29 MED ORDER — OXYCODONE HCL 5 MG PO TABS
5.0000 mg | ORAL_TABLET | ORAL | Status: DC | PRN
  Administered 2015-11-29: 10 mg via ORAL

## 2015-11-29 MED ORDER — SODIUM CHLORIDE 0.9 % IV SOLN: 250.0000 mL | INTRAVENOUS | Status: DC | PRN

## 2015-11-29 MED ORDER — LACTATED RINGERS IV SOLN
INTRAVENOUS | Status: DC | PRN
Start: 2015-11-29 — End: 2015-11-29
  Administered 2015-11-29: 07:00:00 via INTRAVENOUS

## 2015-11-29 MED ORDER — BUPIVACAINE-EPINEPHRINE (PF) 0.25% -1:200000 IJ SOLN
INTRAMUSCULAR | Status: AC
  Filled 2015-11-29: qty 30

## 2015-11-29 MED ORDER — ONDANSETRON HCL 4 MG/2ML IJ SOLN
INTRAMUSCULAR | Status: DC | PRN
Start: 2015-11-29 — End: 2015-11-29
  Administered 2015-11-29: 4 mg via INTRAVENOUS

## 2015-11-29 MED ORDER — PHENYLEPHRINE 40 MCG/ML (10ML) SYRINGE FOR IV PUSH (FOR BLOOD PRESSURE SUPPORT)
PREFILLED_SYRINGE | INTRAVENOUS | Status: AC
  Filled 2015-11-29: qty 10

## 2015-11-29 MED ORDER — ACETAMINOPHEN 650 MG RE SUPP: 650.0000 mg | RECTAL | Status: DC | PRN

## 2015-11-29 MED ORDER — SUGAMMADEX SODIUM 200 MG/2ML IV SOLN
INTRAVENOUS | Status: DC | PRN
  Administered 2015-11-29: 200 mg via INTRAVENOUS

## 2015-11-29 MED ORDER — PROPOFOL 10 MG/ML IV BOLUS
INTRAVENOUS | Status: DC | PRN
  Administered 2015-11-29: 200 mg via INTRAVENOUS
  Administered 2015-11-29: 120 mg via INTRAVENOUS

## 2015-11-29 MED ORDER — FENTANYL CITRATE (PF) 100 MCG/2ML IJ SOLN
25.0000 ug | INTRAMUSCULAR | Status: DC | PRN
  Administered 2015-11-29: 25 ug via INTRAVENOUS

## 2015-11-29 MED ORDER — OXYCODONE HCL 5 MG PO TABS
ORAL_TABLET | ORAL | Status: AC
  Administered 2015-11-29: 10 mg via ORAL
  Filled 2015-11-29: qty 2

## 2015-11-29 MED ORDER — SODIUM CHLORIDE 0.9% FLUSH: 3.0000 mL | INTRAVENOUS | Status: DC | PRN

## 2015-11-29 MED ORDER — FENTANYL CITRATE (PF) 250 MCG/5ML IJ SOLN
INTRAMUSCULAR | Status: DC | PRN
  Administered 2015-11-29: 50 ug via INTRAVENOUS
  Administered 2015-11-29: 100 ug via INTRAVENOUS
  Administered 2015-11-29: 25 ug via INTRAVENOUS

## 2015-11-29 MED ORDER — MIDAZOLAM HCL 5 MG/5ML IJ SOLN
INTRAMUSCULAR | Status: DC | PRN
  Administered 2015-11-29: 2 mg via INTRAVENOUS

## 2015-11-29 MED ORDER — ROCURONIUM BROMIDE 50 MG/5ML IV SOLN
INTRAVENOUS | Status: AC
  Filled 2015-11-29: qty 1

## 2015-11-29 MED ORDER — DEXAMETHASONE SODIUM PHOSPHATE 10 MG/ML IJ SOLN
INTRAMUSCULAR | Status: AC
  Filled 2015-11-29: qty 1

## 2015-11-29 MED ORDER — OXYCODONE HCL 5 MG PO TABS: 5.0000 mg | ORAL_TABLET | Freq: Four times a day (QID) | ORAL | Status: DC | PRN

## 2015-11-29 MED ORDER — SODIUM CHLORIDE 0.9% FLUSH: 3.0000 mL | Freq: Two times a day (BID) | INTRAVENOUS | Status: DC

## 2015-11-29 SURGICAL SUPPLY — 49 items
ADH SKN CLS APL DERMABOND .7 (GAUZE/BANDAGES/DRESSINGS) ×2
APPLIER CLIP ROT 10 11.4 M/L (STAPLE) ×3
APR CLP MED LRG 11.4X10 (STAPLE) ×2
BAG SPEC RTRVL LRG 6X4 10 (ENDOMECHANICALS) ×2
BLADE SURG ROTATE 9660 (MISCELLANEOUS) IMPLANT
CANISTER SUCTION 2500CC (MISCELLANEOUS) ×3 IMPLANT
CHLORAPREP W/TINT 26ML (MISCELLANEOUS) ×3 IMPLANT
CLIP APPLIE ROT 10 11.4 M/L (STAPLE) ×2 IMPLANT
CLIP LIGATING HEMO O LOK GREEN (MISCELLANEOUS) ×2 IMPLANT
COVER MAYO STAND STRL (DRAPES) ×1 IMPLANT
COVER SURGICAL LIGHT HANDLE (MISCELLANEOUS) ×3 IMPLANT
DERMABOND ADVANCED (GAUZE/BANDAGES/DRESSINGS) ×1
DERMABOND ADVANCED .7 DNX12 (GAUZE/BANDAGES/DRESSINGS) ×2 IMPLANT
DRAPE C-ARM 42X72 X-RAY (DRAPES) ×3 IMPLANT
DRAPE WARM FLUID 44X44 (DRAPE) ×3 IMPLANT
ELECT REM PT RETURN 9FT ADLT (ELECTROSURGICAL) ×3
ELECTRODE REM PT RTRN 9FT ADLT (ELECTROSURGICAL) ×2 IMPLANT
FILTER SMOKE EVAC LAPAROSHD (FILTER) IMPLANT
GLOVE BIO SURGEON STRL SZ 6 (GLOVE) ×3 IMPLANT
GLOVE BIOGEL PI IND STRL 6.5 (GLOVE) ×2 IMPLANT
GLOVE BIOGEL PI IND STRL 7.0 (GLOVE) ×1 IMPLANT
GLOVE BIOGEL PI INDICATOR 6.5 (GLOVE) ×1
GLOVE BIOGEL PI INDICATOR 7.0 (GLOVE) ×1
GLOVE SURG SS PI 6.0 STRL IVOR (GLOVE) ×2 IMPLANT
GLOVE SURG SS PI 6.5 STRL IVOR (GLOVE) ×4 IMPLANT
GOWN STRL REUS W/ TWL LRG LVL3 (GOWN DISPOSABLE) ×4 IMPLANT
GOWN STRL REUS W/TWL 2XL LVL3 (GOWN DISPOSABLE) ×3 IMPLANT
GOWN STRL REUS W/TWL LRG LVL3 (GOWN DISPOSABLE) ×6
KIT BASIN OR (CUSTOM PROCEDURE TRAY) ×3 IMPLANT
KIT ROOM TURNOVER OR (KITS) ×3 IMPLANT
L-HOOK LAP DISP 36CM (ELECTROSURGICAL) ×3
LHOOK LAP DISP 36CM (ELECTROSURGICAL) ×2 IMPLANT
NS IRRIG 1000ML POUR BTL (IV SOLUTION) ×5 IMPLANT
PAD ARMBOARD 7.5X6 YLW CONV (MISCELLANEOUS) ×3 IMPLANT
PENCIL BUTTON HOLSTER BLD 10FT (ELECTRODE) ×3 IMPLANT
POUCH SPECIMEN RETRIEVAL 10MM (ENDOMECHANICALS) ×3 IMPLANT
SCISSORS LAP 5X35 DISP (ENDOMECHANICALS) ×3 IMPLANT
SET CHOLANGIOGRAPH 5 50 .035 (SET/KITS/TRAYS/PACK) ×1 IMPLANT
SET IRRIG TUBING LAPAROSCOPIC (IRRIGATION / IRRIGATOR) ×3 IMPLANT
SLEEVE ENDOPATH XCEL 5M (ENDOMECHANICALS) ×3 IMPLANT
SPECIMEN JAR SMALL (MISCELLANEOUS) ×3 IMPLANT
SUT MNCRL AB 4-0 PS2 18 (SUTURE) ×3 IMPLANT
TOWEL OR 17X24 6PK STRL BLUE (TOWEL DISPOSABLE) ×3 IMPLANT
TOWEL OR 17X26 10 PK STRL BLUE (TOWEL DISPOSABLE) ×1 IMPLANT
TRAY LAPAROSCOPIC MC (CUSTOM PROCEDURE TRAY) ×3 IMPLANT
TROCAR XCEL BLUNT TIP 100MML (ENDOMECHANICALS) ×3 IMPLANT
TROCAR XCEL NON-BLD 11X100MML (ENDOMECHANICALS) ×3 IMPLANT
TROCAR XCEL NON-BLD 5MMX100MML (ENDOMECHANICALS) ×3 IMPLANT
TUBING INSUFFLATION (TUBING) ×3 IMPLANT

## 2015-11-29 NOTE — Anesthesia Procedure Notes (Signed)
Procedure Name: Intubation Date/Time: 11/29/2015 7:56 AM Performed by: Myna Bright Pre-anesthesia Checklist: Patient identified, Emergency Drugs available, Suction available and Patient being monitored Patient Re-evaluated:Patient Re-evaluated prior to inductionOxygen Delivery Method: Circle system utilized Preoxygenation: Pre-oxygenation with 100% oxygen Intubation Type: IV induction Ventilation: Oral airway inserted - appropriate to patient size and Two handed mask ventilation required Laryngoscope Size: Mac and 3 Grade View: Grade I Tube type: Oral Tube size: 7.0 mm Number of attempts: 1 Airway Equipment and Method: Stylet

## 2015-11-29 NOTE — Transfer of Care (Signed)
Immediate Anesthesia Transfer of Care Note  Patient: [name]  Procedure(s) Performed: Procedure(s): LAPAROSCOPIC CHOLECYSTECTOMY (N/A)  Patient Location: PACU  Anesthesia Type:General  Level of Consciousness: awake, alert , oriented and patient cooperative  Airway & Oxygen Therapy: Patient Spontanous Breathing and Patient connected to face mask oxygen  Post-op Assessment: Report given to RN, Post -op Vital signs reviewed and stable and Patient moving all extremities  Post vital signs: Reviewed and stable  Last Vitals:  Filed Vitals:   11/29/15 0633  BP: 162/69  Temp: 36.9 C  Resp: 20    Last Pain: There were no vitals filed for this visit.       Complications: No apparent anesthesia complications

## 2015-11-29 NOTE — Discharge Instructions (Signed)
[facility] Office Phone Number [phone]   POST OP INSTRUCTIONS  Always review your discharge instruction sheet given to you by the facility where your surgery was performed.  IF YOU HAVE DISABILITY OR FAMILY LEAVE FORMS, YOU MUST BRING THEM TO THE OFFICE FOR PROCESSING.  DO NOT GIVE THEM TO YOUR DOCTOR.  1. A prescription for pain medication may be given to you upon discharge.  Take your pain medication as prescribed, if needed.  If narcotic pain medicine is not needed, then you may take acetaminophen (Tylenol) or ibuprofen (Advil) as needed. 2. Take your usually prescribed medications unless otherwise directed 3. If you need a refill on your pain medication, please contact your pharmacy.  They will contact our office to request authorization.  Prescriptions will not be filled after 5pm or on week-ends. 4. You should eat very light the first 24 hours after surgery, such as soup, crackers, pudding, etc.  Resume your normal diet the day after surgery 5. It is common to experience some constipation if taking pain medication after surgery.  Increasing fluid intake and taking a stool softener will usually help or prevent this problem from occurring.  A mild laxative (Milk of Magnesia or Miralax) should be taken according to package directions if there are no bowel movements after 48 hours. 6. You may shower in 48 hours.  The surgical glue will flake off in 2-3 weeks.   7. ACTIVITIES:  No strenuous activity or heavy lifting for 1 week.   a. You may drive when you no longer are taking prescription pain medication, you can comfortably wear a seatbelt, and you can safely maneuver your car and apply brakes. b. RETURN TO WORK:  __________n/a_______________ You should see your doctor in the office for a follow-up appointment approximately three-four weeks after your surgery.    WHEN TO CALL YOUR DOCTOR: 1. Fever over 101.0 2. Nausea and/or vomiting. 3. Extreme swelling or  bruising. 4. Continued bleeding from incision. 5. Increased pain, redness, or drainage from the incision.  The clinic staff is available to answer your questions during regular business hours.  Please don't hesitate to call and ask to speak to one of the nurses for clinical concerns.  If you have a medical emergency, go to the nearest emergency room or call 911.  A surgeon from [facility] is always on call at the hospital.  For further questions, please visit [email or web address]      

## 2015-11-29 NOTE — Op Note (Signed)
Laparoscopic Cholecystectomy   Indications: This patient presents with chronic cholecystitis and will undergo laparoscopic cholecystectomy.  Pre-operative Diagnosis: chronic cholecystits  Post-operative Diagnosis: Same  Surgeon: [name]   Anesthesia: General endotracheal anesthesia and local  ASA Class: 3  Procedure Details  The patient was seen again in the Holding Room. The risks, benefits, complications, treatment options, and expected outcomes were discussed with the patient. The possibilities of  bleeding, recurrent infection, damage to nearby structures, the need for additional procedures, failure to diagnose a condition, the possible need to convert to an open procedure, and creating a complication requiring transfusion or operation were discussed with the patient. The likelihood of improving the patient's symptoms with return to their baseline status is good.    The patient and/or family concurred with the proposed plan, giving informed consent. The site of surgery properly noted. The patient was taken to Operating Room, and the procedure verified as Laparoscopic Cholecystectomy with Intraoperative Cholangiogram. A Time Out was held and the above information confirmed.  Prior to the induction of general anesthesia, antibiotic prophylaxis was administered. General endotracheal anesthesia was then administered and tolerated well. After the induction, the abdomen was prepped with Chloraprep and draped in the sterile fashion. The patient was positioned in the supine position.  Local anesthetic agent was injected into the skin near the umbilicus and an incision made. We dissected down to the abdominal fascia with blunt dissection.  The fascia was incised vertically and we entered the peritoneal cavity bluntly.  A pursestring suture of 0-Vicryl was placed around the fascial opening.  The Hasson cannula was inserted and secured with the stay suture.  Pneumoperitoneum was then created with  CO2 and tolerated well without any adverse changes in the patient's vital signs. An 11-mm port was placed in the subxiphoid position.  Two 5-mm ports were placed in the right upper quadrant. All skin incisions were infiltrated with a local anesthetic agent before making the incision and placing the trocars.   We positioned the patient in reverse Trendelenburg, tilted slightly to the patient's left.  The gallbladder was identified, the fundus grasped and retracted cephalad. Adhesions were lysed bluntly and with the electrocautery where indicated, taking care not to injure any adjacent organs or viscus. The infundibulum was grasped and retracted laterally, exposing the peritoneum overlying the triangle of Calot. This was then divided and exposed in a blunt fashion. A critical view of the cystic duct and cystic artery was obtained.  The cystic duct was clearly identified and bluntly dissected circumferentially.  The cystic duct was then ligated with clips and divided. The cystic artery was identified, dissected free, ligated with clips and divided as well.   The gallbladder was dissected from the liver bed in retrograde fashion with the electrocautery. The gallbladder was removed and placed in an Endocatch bag.  The gallbladder and Endocatch bag were then removed through the umbilical port site.  The liver bed was irrigated and inspected. Hemostasis was achieved with the electrocautery. Copious irrigation was utilized and was repeatedly aspirated until clear.    We again inspected the right upper quadrant for hemostasis.  Pneumoperitoneum was released as we removed the trocars.   The pursestring suture was used to close the umbilical fascia.  4-0 Monocryl was used to close the skin.   The skin was cleaned and dry, and Dermabond was applied. The patient was then extubated and brought to the recovery room in stable condition. Instrument, sponge, and needle counts were correct at closure and at  the conclusion of the  case.   Findings: Chronic inflammation.    Estimated Blood Loss: min             Specimens: Gallbladder to pathology       Complications: None; patient tolerated the procedure well.         Disposition: PACU - hemodynamically stable.         Condition: stable

## 2015-11-29 NOTE — Interval H&P Note (Signed)
History and Physical Interval Note:  11/29/2015 7:36 AM  [name]  has presented today for surgery, with the diagnosis of chronic calculous cholecystitis  The various methods of treatment have been discussed with the patient and family. After consideration of risks, benefits and other options for treatment, the patient has consented to  Procedure(s): LAPAROSCOPIC CHOLECYSTECTOMY WITH POSSIBLE INTRAOPERATIVE CHOLANGIOGRAM (N/A) as a surgical intervention .  The patient's history has been reviewed, patient examined, no change in status, stable for surgery.  I have reviewed the patient's chart and labs.  Questions were answered to the patient's satisfaction.     [name]

## 2015-11-29 NOTE — H&P (Signed)
[name] 11/20/2015 1:29 PM Location: [facility] Patient #: [id number] DOB: [birth year] Undefined / Language: [name] / Race: White Female   History of Present Illness Linda Klein MD; 11/20/2015 2:43 PM) Patient words: GB.  The patient is a 75 year old female who presents for evaluation of gall stones. Pt is a 75 yo F who presents at the request of Dr. Philip Mclean for gallbladder disease. She has chronic RUQ soreness and significant cramping feeling when she eats. She feels like it is going to "burst" when she has anything by mouth. She has not had severe pain often until recently, but remembers an episode more than 20 years ago that felt similar. She has had a previous ultrasound with a gallbladder polyp noted, but no stones. HIDA was performed in 2003 showed abnormal GB EF but it was barely below normal. She was not having symptoms as often at that time. She denies fever/chills/jaundice.   RUQ u/s 10/24/15 IMPRESSION: 1. No echogenic mobile shadowing stones. Gallbladder polyps are present and have been previously demonstrated. There is no sonographic evidence of acute cholecystitis. If there is clinical concern of chronic gallbladder dysfunction, a nuclear medicine hepatobiliary scan may be useful. 2. Normal appearance of the common bile duct. Increased hepatic echotexture consistent with fatty infiltrative change.  HIDA 11/14/15 FINDINGS: Prompt uptake and biliary excretion of activity by the liver is seen. Gallbladder activity is visualized, consistent with patency of cystic duct. Biliary activity passes into small bowel, consistent with patent common bile duct.  Calculated gallbladder ejection fraction is 25%. (Normal gallbladder ejection fraction with Ensure is greater than 33%.)  IMPRESSION: Abnormal gallbladder ejection fraction consistent with gallbladder dysfunction. No cystic duct or common bile duct obstruction is observed.  normal  LFTs    Other Problems [name], CMA; 11/20/2015 1:30 PM) Anxiety Disorder Asthma Atrial Fibrillation Back Pain Cholelithiasis Depression High blood pressure Home Oxygen Use Hypercholesterolemia Lump In Breast Pulmonary Embolism / Blood Clot in Legs Sleep Apnea Thyroid Cancer Thyroid Disease Vascular Disease  Past Surgical History [name], [place]; 11/20/2015 1:35 PM) Breast Biopsy Right. Hysterectomy (not due to cancer) - Partial Thyroid Surgery Appendectomy  Diagnostic Studies History [name], [place]; 11/20/2015 1:30 PM) Colonoscopy 5-10 years ago Mammogram within last year Pap Smear >5 years ago  Allergies [name], CMA; 11/20/2015 1:31 PM) Codeine Phosphate *ANALGESICS - OPIOID* Erythromycin Base *MACROLIDES* Tetracycline *CHEMICALS* Statins Latex  Medication History [name], CMA; 11/20/2015 1:35 PM) Alendronate Sodium (70MG  Tablet, Oral) Active. DiltiaZEM HCl ER Coated Beads (360MG  Capsule ER 24HR, Oral) Active. Ezetimibe (10MG  Tablet, Oral) Active. Fluticasone Propionate (50MCG/ACT Suspension, Nasal) Active. Irbesartan (300MG  Tablet, Oral) Active. LORazepam (1MG  Tablet, Oral) Active. Levothyroxine Sodium (100MCG Tablet, Oral) Active. Lutein-Zeaxanthin (25-5MG  Capsule, Oral) Active. Polyethyl Glycol-Polyvinyl Alc (1-1% Solution, Ophthalmic) Active. Warfarin Sodium (7.5MG  Tablet, Oral every day except Wednesday) Active. Warfarin Sodium (5MG  Tablet, Oral only on Wednesday) Active. Medications Reconciled  Social History [name], [place]; 11/20/2015 1:30 PM) Caffeine use Coffee, Tea. No alcohol use No drug use Tobacco use Never smoker.  Family History [name], [place]; 11/20/2015 1:30 PM) Bleeding disorder Father. Breast Cancer Mother, Sister. Cancer Father. Cervical Cancer Sister. Diabetes Mellitus Mother. Hypertension Mother.  Pregnancy / Birth  History [name], [place]; 11/20/2015 1:30 PM) Age at menarche 43 years. Age of menopause 30-55 Gravida 2 Maternal age 81-25 Para 2    Review of Systems Linda Klein MD; 11/20/2015 2:44 PM) General Present- Chills. Not Present- Appetite  Loss, Fatigue, Fever, Night Sweats, Weight Gain and Weight Loss. Skin Not Present- Change in Wart/Mole, Dryness, Hives, Jaundice, New Lesions, Non-Healing Wounds, Rash and Ulcer. HEENT Present- Earache, Seasonal Allergies, Sinus Pain and Wears glasses/contact lenses. Not Present- Hearing Loss, Hoarseness, Nose Bleed, Oral Ulcers, Ringing in the Ears, Sore Throat, Visual Disturbances and Yellow Eyes. Respiratory Present- Snoring. Not Present- Bloody sputum, Chronic Cough, Difficulty Breathing and Wheezing. Breast Not Present- Breast Mass, Breast Pain, Nipple Discharge and Skin Changes. Cardiovascular Present- Palpitations and Swelling of Extremities. Not Present- Chest Pain, Difficulty Breathing Lying Down, Leg Cramps, Rapid Heart Rate and Shortness of Breath. Gastrointestinal Present- Abdominal Pain, Bloating and Nausea. Not Present- Bloody Stool, Change in Bowel Habits, Chronic diarrhea, Constipation, Difficulty Swallowing, Excessive gas, Gets full quickly at meals, Hemorrhoids, Indigestion, Rectal Pain and Vomiting. Female Genitourinary Present- Frequency and Urgency. Not Present- Nocturia, Painful Urination and Pelvic Pain. Musculoskeletal Present- Back Pain. Not Present- Joint Pain, Joint Stiffness, Muscle Pain, Muscle Weakness and Swelling of Extremities. Neurological Not Present- Decreased Memory, Fainting, Headaches, Numbness, Seizures, Tingling, Tremor, Trouble walking and Weakness. Psychiatric Present- Anxiety. Not Present- Bipolar, Change in Sleep Pattern, Depression, Fearful and Frequent crying. Endocrine Not Present- Cold Intolerance, Excessive Hunger, Hair Changes, Heat Intolerance, Hot flashes and New Diabetes. Hematology Not Present-  Easy Bruising, Excessive bleeding, Gland problems, HIV and Persistent Infections.  Vitals [name] CMA; 11/20/2015 1:29 PM) 11/20/2015 1:29 PM Weight: 231.38 lb Height: 63in Body Surface Area: 2.06 m Body Mass Index: 40.99 kg/m  BP: 140/80 (Sitting, Left Arm, Standard)       Physical Exam Linda Klein MD; 11/20/2015 2:44 PM) General Mental Status-Alert. General Appearance-Consistent with stated age. Hydration-Well hydrated. Voice-Normal.  Head and Neck Head-normocephalic, atraumatic with no lesions or palpable masses. Trachea-midline. Thyroid Gland Characteristics - normal size and consistency.  Eye Eyeball - Bilateral-Extraocular movements intact. Sclera/Conjunctiva - Bilateral-No scleral icterus.  Chest and Lung Exam Chest and lung exam reveals -quiet, even and easy respiratory effort with no use of accessory muscles. Inspection Chest Wall - Normal. Back - normal.  Cardiovascular Cardiovascular examination reveals -normal pedal pulses bilaterally.  Abdomen Inspection Inspection of the abdomen reveals - No Hernias. Palpation/Percussion Palpation and Percussion of the abdomen reveal - Soft, No Rebound tenderness, No Rigidity (guarding) and No hepatosplenomegaly. Note: tender RUQ. Auscultation Auscultation of the abdomen reveals - Bowel sounds normal.  Neurologic Neurologic evaluation reveals -alert and oriented x 3 with no impairment of recent or remote memory. Mental Status-Normal.  Musculoskeletal Global Assessment -Note: no gross deformities.  Normal Exam - Left-Upper Extremity Strength Normal and Lower Extremity Strength Normal. Normal Exam - Right-Upper Extremity Strength Normal and Lower Extremity Strength Normal.  Lymphatic Head & Neck  General Head & Neck Lymphatics: Bilateral - Description - Normal. Axillary  General Axillary Region: Bilateral - Description - Normal. Tenderness - Non  Tender. Femoral & Inguinal  Generalized Femoral & Inguinal Lymphatics: Bilateral - Description - No Generalized lymphadenopathy.    Assessment & Plan Linda Klein MD; 11/20/2015 2:49 PM) CHRONIC CALCULOUS CHOLECYSTITIS (K80.10) Impression: Pt would benefit from lap chole. She has classic symptoms of chronic cholecystitis.  The surgical procedure was described to the patient in detail. The patient was given educational material. I discussed the incision type and location, the location of the gallbladder, the anatomy of the bile ducts and arteries, and the typical progression of surgery. I discussed the possibility of converting to an open operation. I advised of the risks of bleeding, infection, damage to other structures (  such as the bile duct, intestine or liver), bile leak, need for other procedures or surgeries, and post op diarrhea/constipation. We discussed the risk of blood clot. We discussed the recovery period and post operative restrictions.  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 Contacting Dr [name] to see if he is OK wtih holding coumadin. Will also find out if he would want lovenox bridge. Current Plans Pt Education - Laparoscopic Cholecystectomy: gallbladder You are being scheduled for surgery - Our schedulers will call you.  You should hear from our office's scheduling department within 5 working days about the location, date, and time of surgery. We try to make accommodations for patient's preferences in scheduling surgery, but sometimes the OR schedule or the surgeon's schedule prevents [place] from making those accommodations.  If you have not heard from our office [phone]) in 5 working days, call the office and ask for your surgeon's nurse.  If you have other questions about your diagnosis, plan, or surgery, call the office and ask for your surgeon's nurse.  ANTICOAGULATED ON WARFARIN (Z79.01) Impression: Contacting Dr. Philip Mclean about management. 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

## 2015-11-29 NOTE — Anesthesia Postprocedure Evaluation (Signed)
Anesthesia Post Note  Patient: [name]  Procedure(s) Performed: Procedure(s) (LRB): LAPAROSCOPIC CHOLECYSTECTOMY (N/A)  Patient location during evaluation: PACU Anesthesia Type: General Level of consciousness: awake and alert Pain management: pain level controlled Vital Signs Assessment: post-procedure vital signs reviewed and stable Respiratory status: spontaneous breathing, nonlabored ventilation, respiratory function stable and patient connected to nasal cannula oxygen Cardiovascular status: blood pressure returned to baseline and stable Postop Assessment: no signs of nausea or vomiting Anesthetic complications: no    Last Vitals:  Filed Vitals:   11/29/15 1030 11/29/15 1045  BP: 128/88 137/71  Pulse: 48 67  Temp:    Resp: 15 16    Last Pain:  Filed Vitals:   11/29/15 1050  PainSc: 2                  Tiajuana Amass

## 2015-11-30 ENCOUNTER — Encounter (HOSPITAL_COMMUNITY): Payer: Self-pay | Admitting: General Surgery

## 2015-12-15 ENCOUNTER — Other Ambulatory Visit: Payer: Self-pay | Admitting: Interventional Cardiology

## 2015-12-18 ENCOUNTER — Other Ambulatory Visit: Payer: Self-pay

## 2015-12-18 NOTE — Telephone Encounter (Signed)
AVAPRO 300 MG tablet  Medication   Date: 12/17/2015  Department: [facility] [place] Office  Ordering/Authorizing: Jettie Booze, MD      Order Providers    Prescribing Provider Encounter Provider   Jettie Booze, MD Jettie Booze, MD    Medication Detail      Disp Refills Start End     AVAPRO 300 MG tablet 45 tablet 0 12/17/2015     Sig: TAKE ONE-HALF TABLET BY MOUTH AT BEDTIME.    Notes to Pharmacy: ADVISE PATIENT SHE IS DUE FOR OV/ FI:[id number]    E-Prescribing Status: Receipt confirmed by pharmacy (12/17/2015 9:29 AM EDT)     Pharmacy    [facility] [place], [place] [id number]     Current refills on file with [facility] as of 12/17/15

## 2015-12-21 ENCOUNTER — Telehealth: Payer: Self-pay

## 2015-12-21 NOTE — Telephone Encounter (Signed)
Prior auth for Avapro 300mg  faxed to [facility].

## 2016-01-07 ENCOUNTER — Telehealth: Payer: Self-pay

## 2016-01-07 NOTE — Telephone Encounter (Signed)
Avapro 300mg  approved by [employer]. Good through 12/20/2016.

## 2016-01-18 ENCOUNTER — Other Ambulatory Visit: Payer: Self-pay | Admitting: Internal Medicine

## 2016-01-18 DIAGNOSIS — R221 Localized swelling, mass and lump, neck: Secondary | ICD-10-CM

## 2016-01-31 ENCOUNTER — Ambulatory Visit
Admission: RE | Admit: 2016-01-31 | Discharge: 2016-01-31 | Disposition: A | Discharge status: Home / Self Care | Payer: Medicare Other | Source: Ambulatory Visit | Attending: Internal Medicine | Admitting: Internal Medicine

## 2016-01-31 DIAGNOSIS — R221 Localized swelling, mass and lump, neck: Secondary | ICD-10-CM

## 2016-03-17 ENCOUNTER — Other Ambulatory Visit: Payer: Self-pay | Admitting: Interventional Cardiology

## 2016-03-24 ENCOUNTER — Other Ambulatory Visit: Payer: Self-pay | Admitting: Internal Medicine

## 2016-03-24 DIAGNOSIS — Z1231 Encounter for screening mammogram for malignant neoplasm of breast: Secondary | ICD-10-CM

## 2016-04-10 ENCOUNTER — Encounter: Payer: Self-pay | Admitting: Interventional Cardiology

## 2016-04-23 ENCOUNTER — Ambulatory Visit
Admission: RE | Admit: 2016-04-23 | Discharge: 2016-04-23 | Disposition: A | Discharge status: Home / Self Care | Payer: Medicare Other | Source: Ambulatory Visit | Attending: Internal Medicine | Admitting: Internal Medicine

## 2016-04-23 DIAGNOSIS — Z1231 Encounter for screening mammogram for malignant neoplasm of breast: Secondary | ICD-10-CM

## 2016-04-24 NOTE — Progress Notes (Signed)
Patient ID: [name], female   DOB: Apr 05, 1941, 75 y.o.   MRN: [id number]     Cardiology Office Note   Date:  04/25/2016   ID:  [name], DOB 09/09/1940, MRN [id number]  PCP:  [name], MD    No chief complaint on file. HTN   Wt Readings from Last 3 Encounters:  04/25/16 107.7 kg (237 lb 6.4 oz)  11/29/15 104.3 kg (230 lb)  11/28/15 104.6 kg (230 lb 11.2 oz)       History of Present Illness: [name] is a 75 y.o. female   with a h/o of DVT/PE. SHe was treated with Coumadin for 6 months. No reason found for DVTs. She had a stress test in 4/09 which was negative for ischemia. She exercised for 6:15 seconds. She had IBS but in 4/10, had persistent pain and had blood in her stool while off coumadin, she was diagnosed with ischemic colitis. Later, she states she was diagnosed with polycythemia, which was being managed by Dr. Jamse Arn. She was also placed on Xarelto, after a CT scan but then switched back to Coumadin.;.  BP has been well controlled. See below. Readings typically higher in the MDs office.  Hypertension:  felt poorly off of brand name Avapro. Has tried losartan and irbesartan. BP was not well controlled off of Avapro. Cough resolved. c/o Leg edema more at the end of the day. Chronic.  Wears support stockings.  Denies : Orthopnea. Paroxysmal nocturnal dyspnea. Palpitations. Syncope.   controlled at home. No chest pain with exertion.  Overall doing well. Nt exercising as muh as she should per her report.  BP has been  in the [id number] range systolic for the most part.   She has gained weight.  She had a skin cancer removal also.  She required some ABx at that time.   Cholecystectomy done in 6/17.  Did well.      Past Medical History:  Diagnosis Date  . Anxiety    takes Lorazepam daily as needed  . Arthritis   . Cancer ([place])    thyroid  . Cataracts, bilateral    immature  . Chronic back pain    compressed  vertebrae,takes Fosamax weekly  . Family history of adverse reaction to anesthesia    granddaughter gets very sick and mean  . History of blood transfusion    no abnormal reaction noted  . History of bronchitis 05/2015  . History of colon polyps    benign  . Hyperlipidemia    takes Zetia daily  . Hypertension    takes Diltiazem and Avapro daily  . Hypothyroidism    takes Synthroid daily  . Ischemic colitis ([place])    hx of-many yrs ago  . Joint pain   . Joint swelling   . OSA on CPAP    05-08-12 AHi was 46, RDI  53. titrated to   9 cm water  with 3 cm flex.-nadir 75%  . PE (pulmonary embolism)    takes Coumadin daily  . Peripheral edema   . Pneumonia 1992   hx of  . Sarcoidosis ([place])   . Shortness of breath   . Urinary frequency   . Weakness    left leg.Numbness and tingling.Has sciatica  . Wheeze    occaionally. Not new    Past Surgical History:  Procedure Laterality Date  . ABDOMINAL HYSTERECTOMY    . APPENDECTOMY    . BREAST BIOPSY     biopsies on right  x 2  . CHOLECYSTECTOMY N/A 11/29/2015   Procedure: LAPAROSCOPIC CHOLECYSTECTOMY;  Surgeon: [name], MD;  Location: [facility];  Service: General;  Laterality: N/A;  . COLONOSCOPY    . DILATION AND CURETTAGE OF UTERUS    . LIVER BIOPSY    . MELANOMA EXCISION     removed from left leg  . scalene surgery  1972   "trying to find out what was wrong with her lungs"  . THYROID SURGERY       Current Outpatient Prescriptions  Medication Sig Dispense Refill  . alendronate (FOSAMAX) 70 MG tablet Take 70 mg by mouth once a week.     . AVAPRO 300 MG tablet TAKE ONE-HALF TABLET BY MOUTH AT BEDTIME. 45 tablet 0  . Carboxymethylcellulose Sodium (REFRESH LIQUIGEL OP) Place 1 drop into both eyes at bedtime.    Marland Kitchen diltiazem (TIAZAC) 360 MG 24 hr capsule Take 1 capsule by mouth daily.    . fluticasone (FLONASE) 50 MCG/ACT nasal spray Place 1 spray into the nose daily as needed for allergies. For allergies.    Marland Kitchen levothyroxine  (SYNTHROID, LEVOTHROID) 100 MCG tablet Take 100 mcg by mouth daily before breakfast.   1  . LORazepam (ATIVAN) 1 MG tablet Take 1 mg by mouth as needed for anxiety.     . Lutein-Zeaxanthin 25-5 MG CAPS Take 1 tablet by mouth daily. EYE VITAMIN    . nystatin cream (MYCOSTATIN) Apply 1 application topically daily.   1  . warfarin (COUMADIN) 5 MG tablet Take 5 mg by mouth every Wednesday. Take on Wednesday.  2  . warfarin (COUMADIN) 7.5 MG tablet Take 7.5 mg by mouth See admin instructions. Nancy Fetter Mon Tues Thurs Fri Sat    . ZETIA 10 MG tablet TAKE ONE TABLET BY MOUTH ONCE DAILY 15 tablet 0   No current facility-administered medications for this visit.     Allergies:   Codeine; Erythromycin; Tetracyclines & related; Statins; and Latex    Social History:  The patient  reports that she has never smoked. She has never used smokeless tobacco. She reports that she does not drink alcohol or use drugs.   Family History:  The patient's *family history includes CVA in her mother.    ROS:  Please see the history of present illness.   Otherwise, review of systems are positive for leg pain after surgery, which has improved.   All other systems are reviewed and negative.    PHYSICAL EXAM: VS:  BP (!) 145/80   Pulse 80   Ht 5' 3.5" (1.613 m)   Wt 107.7 kg (237 lb 6.4 oz)   BMI 41.39 kg/m  , BMI Body mass index is 41.39 kg/m. GEN: Well nourished, well developed, in no acute distress  HEENT: normal  Neck: no JVD, carotid bruits, or masses Cardiac: RRR; no murmurs, rubs, or gallops,no edema  Respiratory:  clear to auscultation bilaterally, normal work of breathing GI: soft, nontender, nondistended, + BS MS: no deformity or atrophy  Skin: warm and dry, no rash Neuro:  Strength and sensation are intact Psych: euthymic mood, full affect   EKG:   The ekg ordered today demonstrates Normal ECG   Recent Labs: 11/28/2015: BUN 12; Creatinine, Ser 0.79; Hemoglobin 14.4; Platelets 239; Potassium 3.9;  Sodium 141   Lipid Panel No results found for: CHOL, TRIG, HDL, CHOLHDL, VLDL, LDLCALC, LDLDIRECT   Other studies Reviewed: Additional studies/ records that were reviewed today with results demonstrating: LVEF 60-65% in 12/13..   ASSESSMENT  AND PLAN:  1. Hypertension, essential  Continue Diltiazem HCl ER Beads Capsule Extended Release 24 Hour, 360 MG, 1 capsule, Orally, Once a day BP with variable control. Does well on brand name Avapro. BP readings were increased when generic Avapro was used.  We have informed the insurance company about this in the past.  2. Mixed hyperlipidemia  She has been intolerant of Crestor, Lipitor, simvastatin.  Did  tolerate Zetia in the past, but was stopped.  Now tolerating Zetia 5 mg daily.  Checked at Dr. Buel Ream office and improved. 3.  Obesity:  She has gained weight.    She decreased exercise since that time.  Stressed importance of diet and exercise.    4.  No bleeding problems on her chronic anticoagulation for DVT/PE in the past. TOlerating CPAP well.      Current medicines are reviewed at length with the patient today.  The patient concerns regarding her medicines were addressed.  The following changes have been made:  No change  Labs/ tests ordered today include: Obtain results from Dr. Sharlett Iles office No orders of the defined types were placed in this encounter.   Recommend 150 minutes/week of aerobic exercise Low fat, low carb, high fiber diet recommended  Disposition:   FU in 1 year   Signed, Larae Grooms, MD  04/25/2016 10:10 AM    [facility] [place], [place], [place]  [address] Phone: [phone]; Fax: [phone]

## 2016-04-25 ENCOUNTER — Encounter (INDEPENDENT_AMBULATORY_CARE_PROVIDER_SITE_OTHER): Payer: Self-pay

## 2016-04-25 ENCOUNTER — Encounter: Payer: Self-pay | Admitting: Interventional Cardiology

## 2016-04-25 ENCOUNTER — Ambulatory Visit (INDEPENDENT_AMBULATORY_CARE_PROVIDER_SITE_OTHER): Payer: Medicare Other | Admitting: Interventional Cardiology

## 2016-04-25 VITALS — BP 145/80 | HR 80 | Ht 63.5 in | Wt 237.4 lb

## 2016-04-25 DIAGNOSIS — E782 Mixed hyperlipidemia: Secondary | ICD-10-CM | POA: Diagnosis not present

## 2016-04-25 DIAGNOSIS — G4733 Obstructive sleep apnea (adult) (pediatric): Secondary | ICD-10-CM

## 2016-04-25 DIAGNOSIS — I10 Essential (primary) hypertension: Secondary | ICD-10-CM

## 2016-04-25 MED ORDER — AVAPRO 300 MG PO TABS: 150.0000 mg | ORAL_TABLET | Freq: Every day | ORAL | 3 refills | Status: DC

## 2016-04-25 MED ORDER — ZETIA 10 MG PO TABS: 10.0000 mg | ORAL_TABLET | Freq: Every day | ORAL | 3 refills | Status: DC

## 2016-04-25 NOTE — Patient Instructions (Signed)

## 2016-05-20 ENCOUNTER — Ambulatory Visit: Payer: Medicare Other | Admitting: Adult Health

## 2016-05-21 ENCOUNTER — Ambulatory Visit (INDEPENDENT_AMBULATORY_CARE_PROVIDER_SITE_OTHER): Payer: Medicare Other | Admitting: Adult Health

## 2016-05-21 ENCOUNTER — Encounter: Payer: Self-pay | Admitting: Adult Health

## 2016-05-21 ENCOUNTER — Ambulatory Visit: Payer: Medicare Other | Admitting: Adult Health

## 2016-05-21 VITALS — BP 168/88 | HR 67 | Wt 235.4 lb

## 2016-05-21 DIAGNOSIS — Z9989 Dependence on other enabling machines and devices: Secondary | ICD-10-CM | POA: Diagnosis not present

## 2016-05-21 DIAGNOSIS — G4733 Obstructive sleep apnea (adult) (pediatric): Secondary | ICD-10-CM | POA: Diagnosis not present

## 2016-05-21 NOTE — Progress Notes (Signed)
PATIENT: [name] DOB: 03-16-1941  REASON FOR VISIT: follow up- OSA on CPAP HISTORY FROM: patient  HISTORY OF PRESENT ILLNESS: [name] is a 75 year old female with a history of obstructive sleep apnea on CPAP. She returns today for follow-up. Her download indicates that she uses her machine 30 out of 30 days for compliance of 100%. She uses her machine greater than 4 hours each night. On average she uses her machine 6 hours and 41 minutes. Her residual AHI is 0.6 on 9 cm of water. Her leak in the 95th percentile is 21.5 L/m. She reports occasionally the mask will slip off during the night. She states it does wake her up and she will readjust the mask. Overall she feels that this is working well for her. Denies any new neurological symptoms. Returns today for an evaluation.  HISTORY 05/24/15: [name] is a 75 year old female with a history of obstructive sleep apnea on CPAP. She returns today for a compliance download. The patient states that she has been sick for the last 3 weeks. She was on prednisone and now is on an antibiotic. For that reason she hass not use her CPAP in the last 2 weeks. The patient's CPAP download indicates that she used her machine 70 out of 90 days for compliance of 78%.  She used her machine greater than 4 hours 70 out of 90 days for compliance of 70%. She uses her machine 6 hours and 35 minutes each night. Her residual AHI is 0.6 on 9 cm of water. The patient leak in the 95th percentile at 29.6 L/m. The patient states that overall she is very pleased with the CPAP. She feels that it is beneficial for her sleep. Other than being sick she typically uses her machine every night. The patient is interested in different straps. She denies any new neurological symptoms. She returns today for an evaluation.  HISTORY 05/24/14: [name] is a 75 y.o. female seen here as a yearly rv patient , originally referred by Dr. Philip Aspen , now seen for  compliance on CPAP therapy.    Interval history : [name] is seen here today for regular yearly revisit. She is as usual a happy individual well groomed and optimistic. She is looking forward to the holidays. Today's fatigue severity score was endorsed at 18 points her sleepiness score at 4 points.  The patient's download from her CPAP machine shows an 84% compliance over the last 90 days. Due to an upper respiratory infection she was unable from November 16 through November 29 to use the machine and she had phlegm and other discharge that prohibited the use of nasal CPAP. With his medical extension she still has an 84% compliance and an average use the time of 6 hours and 35 minutes. Set pressure is 9 cm water without EPR her residual AHI is 0.9 which is excellent. The patient had a productive cough and probably bronchitis as well as sinusitis. She felt more often lightheaded." swimmy headed " when moving her head quickly. BP may have been lower or oxygen was low.  She has used Mucinex and has taken lots of fluids which helped her to break down the phlegm and she is now able to breathe unrestricted again and can use the CPAP. Her sleep habits are unchanged but she reports that she dreams more when she uses a decongestant. That is affected is well known. I will order an ONO on oxygen and on CPAP to confirm  she has sufficient supplementation   REVIEW OF SYSTEMS: Out of a complete 14 system review of symptoms, the patient complains only of the following symptoms, and all other reviewed systems are negative.  Wheezing, ear pain, leg swelling, dreams, chills, nervous/anxious, walking difficulty  ALLERGIES: Allergies  Allergen Reactions  . Codeine Other (See Comments)    Causes hyperactivity  . Erythromycin Itching and Other (See Comments)    Also with jitters  . Tetracyclines & Related Itching    Also with jitters  . Statins Other (See Comments)    Leg muscle weakness  . Latex  Rash    HOME MEDICATIONS: Outpatient Medications Prior to Visit  Medication Sig Dispense Refill  . alendronate (FOSAMAX) 70 MG tablet Take 70 mg by mouth once a week.     . AVAPRO 300 MG tablet Take 0.5 tablets (150 mg total) by mouth at bedtime. 45 tablet 3  . Carboxymethylcellulose Sodium (REFRESH LIQUIGEL OP) Place 1 drop into both eyes at bedtime.    Marland Kitchen diltiazem (TIAZAC) 360 MG 24 hr capsule Take 1 capsule by mouth daily.    . fluticasone (FLONASE) 50 MCG/ACT nasal spray Place 1 spray into the nose daily as needed for allergies. For allergies.    Marland Kitchen levothyroxine (SYNTHROID, LEVOTHROID) 100 MCG tablet Take 100 mcg by mouth daily before breakfast.   1  . LORazepam (ATIVAN) 1 MG tablet Take 1 mg by mouth as needed for anxiety.     . Lutein-Zeaxanthin 25-5 MG CAPS Take 1 tablet by mouth daily. EYE VITAMIN    . nystatin cream (MYCOSTATIN) Apply 1 application topically daily.   1  . warfarin (COUMADIN) 5 MG tablet Take 5 mg by mouth every Wednesday. Take on Wednesday.  2  . warfarin (COUMADIN) 7.5 MG tablet Take 7.5 mg by mouth See admin instructions. Nancy Fetter Mon Tues Thurs Fri Sat    . ZETIA 10 MG tablet Take 1 tablet (10 mg total) by mouth daily. 90 tablet 3   No facility-administered medications prior to visit.     PAST MEDICAL HISTORY: Past Medical History:  Diagnosis Date  . Anxiety    takes Lorazepam daily as needed  . Arthritis   . Cancer ([place])    thyroid  . Cataracts, bilateral    immature  . Chronic back pain    compressed vertebrae,takes Fosamax weekly  . Family history of adverse reaction to anesthesia    granddaughter gets very sick and mean  . History of blood transfusion    no abnormal reaction noted  . History of bronchitis 05/2015  . History of colon polyps    benign  . Hyperlipidemia    takes Zetia daily  . Hypertension    takes Diltiazem and Avapro daily  . Hypothyroidism    takes Synthroid daily  . Ischemic colitis ([place])    hx of-many yrs ago  . Joint  pain   . Joint swelling   . OSA on CPAP    05-08-12 AHi was 46, RDI  53. titrated to   9 cm water  with 3 cm flex.-nadir 75%  . PE (pulmonary embolism)    takes Coumadin daily  . Peripheral edema   . Pneumonia 1992   hx of  . Sarcoidosis ([place])   . Shortness of breath   . Urinary frequency   . Weakness    left leg.Numbness and tingling.Has sciatica  . Wheeze    occaionally. Not new    PAST SURGICAL HISTORY: Past Surgical History:  Procedure Laterality Date  . ABDOMINAL HYSTERECTOMY    . APPENDECTOMY    . BREAST BIOPSY     biopsies on right x 2  . CHOLECYSTECTOMY N/A 11/29/2015   Procedure: LAPAROSCOPIC CHOLECYSTECTOMY;  Surgeon: [name], MD;  Location: [facility];  Service: General;  Laterality: N/A;  . COLONOSCOPY    . DILATION AND CURETTAGE OF UTERUS    . LIVER BIOPSY    . MELANOMA EXCISION     removed from left leg  . scalene surgery  1972   "trying to find out what was wrong with her lungs"  . THYROID SURGERY      FAMILY HISTORY: Family History  Problem Relation Age of Onset  . CVA Mother   . Heart attack Neg Hx     SOCIAL HISTORY: Social History   Social History  . Marital status: Married    Spouse name: N/A  . Number of children: N/A  . Years of education: N/A   Occupational History  . Retired Retired   Social History Main Topics  . Smoking status: Never Smoker  . Smokeless tobacco: Never Used  . Alcohol use No  . Drug use: No  . Sexual activity: Not on file   Other Topics Concern  . Not on file   Social History Narrative  . No narrative on file      PHYSICAL EXAM  Vitals:   05/21/16 1357  BP: (!) 168/88  Pulse: 67  Weight: 235 lb 6.4 oz (106.8 kg)   Body mass index is 41.05 kg/m.  Generalized: Well developed, in no acute distress   Neck: Circumference 18 inches,   Neurological examination  Mentation: Alert oriented to time, place, history taking. Follows all commands speech and language fluent Cranial nerve II-XII: Pupils  were equal round reactive to light. Extraocular movements were full, visual field were full on confrontational test. Facial sensation and strength were normal. Uvula tongue midline. Head turning and shoulder shrug  were normal and symmetric.Mallampati 3+ Motor: The motor testing reveals 5 over 5 strength of all 4 extremities. Good symmetric motor tone is noted throughout.  Sensory: Sensory testing is intact to soft touch on all 4 extremities. No evidence of extinction is noted.  Coordination: Cerebellar testing reveals good finger-nose-finger and heel-to-shin bilaterally.  Gait and station: Gait is normal.  Reflexes: Deep tendon reflexes are symmetric and normal bilaterally.   DIAGNOSTIC DATA (LABS, IMAGING, TESTING) - I reviewed patient records, labs, notes, testing and imaging myself where available.  Lab Results  Component Value Date   WBC 7.9 11/28/2015   HGB 14.4 11/28/2015   HCT 44.2 11/28/2015   MCV 88.6 11/28/2015   PLT 239 11/28/2015      Component Value Date/Time   NA 141 11/28/2015 0844   NA 140 11/29/2014 0839   K 3.9 11/28/2015 0844   K 4.4 11/29/2014 0839   CL 108 11/28/2015 0844   CL 104 04/29/2012 1145   CO2 26 11/28/2015 0844   CO2 28 11/29/2014 0839   GLUCOSE 97 11/28/2015 0844   GLUCOSE 121 11/29/2014 0839   GLUCOSE 93 04/29/2012 1145   BUN 12 11/28/2015 0844   BUN 13.5 11/29/2014 0839   CREATININE 0.79 11/28/2015 0844   CREATININE 0.8 11/29/2014 0839   CALCIUM 9.5 11/28/2015 0844   CALCIUM 9.1 11/29/2014 0839   PROT 7.4 11/29/2014 0839   ALBUMIN 3.4 (L) 11/29/2014 0839   AST 19 11/29/2014 0839   ALT 24 11/29/2014 0839   ALKPHOS 84  11/29/2014 0839   BILITOT 0.42 11/29/2014 0839   GFRNONAA >60 11/28/2015 0844   GFRAA >60 11/28/2015 0844      ASSESSMENT AND PLAN 75 y.o. year old female  has a past medical history of Anxiety; Arthritis; Cancer ([place]); Cataracts, bilateral; Chronic back pain; Family history of adverse reaction to anesthesia; History of  blood transfusion; History of bronchitis (05/2015); History of colon polyps; Hyperlipidemia; Hypertension; Hypothyroidism; Ischemic colitis ([place]); Joint pain; Joint swelling; OSA on CPAP; PE (pulmonary embolism); Peripheral edema; Pneumonia (1992); Sarcoidosis ([place]); Shortness of breath; Urinary frequency; Weakness; and Wheeze. here with:  1. OSA on CPAP  Overall the patient is doing well. Her download is excellent. She has good treatment of her apnea. She is encouraged to continue using CPAP nightly. She will return in 1 year with Dr. Brett Fairy.     Ward Givens, MSN, NP-C 05/21/2016, 1:28 PM [facility] [address], [place], [place] [address] [phone]

## 2016-05-21 NOTE — Progress Notes (Signed)
I agree with the assessment and plan as directed by NP .The patient is known to me .   Maigen Mozingo, MD  

## 2016-05-21 NOTE — Patient Instructions (Signed)
Continue CPAP nightly  If your symptoms worsen or you develop new symptoms please let us know.

## 2016-10-31 ENCOUNTER — Other Ambulatory Visit: Payer: Self-pay | Admitting: Internal Medicine

## 2016-10-31 DIAGNOSIS — K429 Umbilical hernia without obstruction or gangrene: Secondary | ICD-10-CM

## 2016-11-05 ENCOUNTER — Ambulatory Visit
Admission: RE | Admit: 2016-11-05 | Discharge: 2016-11-05 | Disposition: A | Discharge status: Home / Self Care | Payer: Medicare Other | Source: Ambulatory Visit | Attending: Internal Medicine | Admitting: Internal Medicine

## 2016-11-05 DIAGNOSIS — K429 Umbilical hernia without obstruction or gangrene: Secondary | ICD-10-CM

## 2016-11-05 MED ORDER — IOPAMIDOL (ISOVUE-300) INJECTION 61%
125.0000 mL | Freq: Once | INTRAVENOUS | Status: AC | PRN
  Administered 2016-11-05: 125 mL via INTRAVENOUS

## 2016-11-12 ENCOUNTER — Ambulatory Visit (INDEPENDENT_AMBULATORY_CARE_PROVIDER_SITE_OTHER): Payer: Medicare Other | Admitting: Ophthalmology

## 2016-11-12 DIAGNOSIS — H35033 Hypertensive retinopathy, bilateral: Secondary | ICD-10-CM | POA: Diagnosis not present

## 2016-11-12 DIAGNOSIS — I10 Essential (primary) hypertension: Secondary | ICD-10-CM | POA: Diagnosis not present

## 2016-11-12 DIAGNOSIS — H43813 Vitreous degeneration, bilateral: Secondary | ICD-10-CM | POA: Diagnosis not present

## 2016-11-12 DIAGNOSIS — H353132 Nonexudative age-related macular degeneration, bilateral, intermediate dry stage: Secondary | ICD-10-CM | POA: Diagnosis not present

## 2016-12-15 ENCOUNTER — Telehealth: Payer: Self-pay

## 2016-12-15 NOTE — Telephone Encounter (Signed)
A non formuary form completed and faxed to [facility] for patients name brand Avapro. Awaiting response.

## 2016-12-16 NOTE — Telephone Encounter (Signed)
[name]([employer]) is calling with an approval for Avapro for a full year . She will fax over the letter to [phone]

## 2017-05-21 ENCOUNTER — Ambulatory Visit: Payer: Medicare Other | Admitting: Neurology

## 2017-05-25 ENCOUNTER — Encounter: Payer: Self-pay | Admitting: Neurology

## 2017-06-01 ENCOUNTER — Other Ambulatory Visit: Payer: Self-pay | Admitting: Internal Medicine

## 2017-06-01 DIAGNOSIS — Z139 Encounter for screening, unspecified: Secondary | ICD-10-CM

## 2017-06-17 DIAGNOSIS — G4733 Obstructive sleep apnea (adult) (pediatric): Secondary | ICD-10-CM | POA: Diagnosis not present

## 2017-06-24 ENCOUNTER — Encounter: Payer: Self-pay | Admitting: Neurology

## 2017-06-24 ENCOUNTER — Ambulatory Visit: Payer: Medicare HMO | Admitting: Neurology

## 2017-06-24 ENCOUNTER — Encounter (INDEPENDENT_AMBULATORY_CARE_PROVIDER_SITE_OTHER): Payer: Self-pay

## 2017-06-24 VITALS — BP 170/86 | HR 75 | Ht 64.0 in | Wt 237.0 lb

## 2017-06-24 DIAGNOSIS — Z9989 Dependence on other enabling machines and devices: Secondary | ICD-10-CM | POA: Diagnosis not present

## 2017-06-24 DIAGNOSIS — G4733 Obstructive sleep apnea (adult) (pediatric): Secondary | ICD-10-CM | POA: Diagnosis not present

## 2017-06-24 NOTE — Patient Instructions (Signed)

## 2017-06-24 NOTE — Progress Notes (Addendum)
PATIENT: [name] DOB: May 17, 1941  REASON FOR VISIT: follow up- OSA on CPAP HISTORY FROM: patient alone -   HISTORY OF PRESENT ILLNESS:   [name], 81,  is seen here today for her yearly scheduled revisit.  On 24 June 2017 her compliance for CPAP is excellent at 100% and average use of time of 8 hours and 57 minutes at night, CPAP is set at 10.6 cm water pressure with 3 cm EPR. This download contains data from June 07, 2017, in the meantime she was switched to a new machine since her old one was 77 years old the heating element  Broke.  She saw Linzie Collin at [facility], who swapped the machine recently for and autotitrator but she states that the pressure window is far and wide between 5 and 20 cm water.  Given that she has done very well on 11 cmH2O I would like for her machine to be set to that pressure, she can remain on 3 cm expiratory pressure relief, she likes her current interface-  a nasal pillow. She does not have major air leaks according to the December download. [name] also uses 3 L of oxygen bled into the machine at night, she reports she has an oxygen concentrator at home.  I am not sure if oxygen and CPAP are both handled by [facility].  Patient stated that during periods of power outage with [place] Michael and with snowstorms , she had been sleeping in a recliner as she cannot go to sleep in a bed without CPAP and oxygen.     MM - 2018 , [name] is a 77 year old female with a history of obstructive sleep apnea on CPAP. She returns today for follow-up. Her download indicates that she uses her machine 30 out of 30 days for compliance of 100%. She uses her machine greater than 4 hours each night. On average she uses her machine 6 hours and 41 minutes. Her residual AHI is 0.6 on 9 cm of water. Her leak in the 95th percentile is 21.5 L/m. She reports occasionally the mask will slip off during the night. She states it does wake her up and she  will readjust the mask. Overall she feels that this is working well for her. Denies any new neurological symptoms. Returns today for an evaluation.  HISTORY 05/24/15: [name] is a 77 year old female with a history of obstructive sleep apnea on CPAP. She returns today for a compliance download. The patient states that she has been sick for the last 3 weeks. She was on prednisone and now is on an antibiotic. For that reason she hass not use her CPAP in the last 2 weeks. The patient's CPAP download indicates that she used her machine 70 out of 90 days for compliance of 78%.  She used her machine greater than 4 hours 70 out of 90 days for compliance of 70%. She uses her machine 6 hours and 35 minutes each night. Her residual AHI is 0.6 on 9 cm of water. The patient leak in the 95th percentile at 29.6 L/m. The patient states that overall she is very pleased with the CPAP. She feels that it is beneficial for her sleep. Other than being sick she typically uses her machine every night. The patient is interested in different straps. She denies any new neurological symptoms. She returns today for an evaluation.  HISTORY 05/24/14: [name] is a 77 y.o. female seen here as a yearly rv patient ,  originally referred by Dr. Philip Aspen , now seen for compliance on CPAP therapy. The patient's download from her CPAP machine shows an 84% compliance over the last 90 days. Due to an upper respiratory infection she was unable from November 16 through November 29 to use the machine and she had phlegm and other discharge that prohibited the use of nasal CPAP. With his medical extension she still has an 84% compliance and an average use the time of 6 hours and 35 minutes. Set pressure is 9 cm water without EPR her residual AHI is 0.9 which is excellent. The patient had a productive cough and probably bronchitis as well as sinusitis. She felt more often lightheaded." swimmy headed " when moving her head quickly. BP  may have been lower or oxygen was low.  She has used Mucinex and has taken lots of fluids which helped her to break down the phlegm and she is now able to breathe unrestricted again and can use the CPAP. Her sleep habits are unchanged but she reports that she dreams more when she uses a decongestant. That is affected is well known. I will order an ONO on oxygen and on CPAP to confirm she has sufficient supplementation   REVIEW OF SYSTEMS: Out of a complete 14 system review of symptoms, the patient complains only of the following symptoms, and all other reviewed systems are negative ankle edema, Dysphonia, nasal congestion, sinus congestion, post nasal drip.  Marland KitchenHoarseness, obesity- ankle edema, leg swelling, dreams, chills, nervous/anxious, walking difficulty.  ALLERGIES: Allergies  Allergen Reactions  . Codeine Other (See Comments)    Causes hyperactivity  . Erythromycin Itching and Other (See Comments)    Also with jitters  . Tetracyclines & Related Itching    Also with jitters  . Statins Other (See Comments)    Leg muscle weakness  . Latex Rash    HOME MEDICATIONS: Outpatient Medications Prior to Visit  Medication Sig Dispense Refill  . alendronate (FOSAMAX) 70 MG tablet Take 70 mg by mouth once a week.     . AVAPRO 300 MG tablet Take 0.5 tablets (150 mg total) by mouth at bedtime. 45 tablet 3  . Carboxymethylcellulose Sodium (REFRESH LIQUIGEL OP) Place 1 drop into both eyes at bedtime.    Marland Kitchen diltiazem (TIAZAC) 360 MG 24 hr capsule Take 1 capsule by mouth daily.    . fluticasone (FLONASE) 50 MCG/ACT nasal spray Place 1 spray into the nose daily as needed for allergies. For allergies.    Marland Kitchen levothyroxine (SYNTHROID, LEVOTHROID) 100 MCG tablet Take 100 mcg by mouth daily before breakfast.   1  . LORazepam (ATIVAN) 1 MG tablet Take 1 mg by mouth as needed for anxiety.     . Lutein-Zeaxanthin 25-5 MG CAPS Take 1 tablet by mouth daily. EYE VITAMIN    . nystatin cream (MYCOSTATIN) Apply  1 application topically daily.   1  . warfarin (COUMADIN) 5 MG tablet Take 5 mg by mouth every Wednesday. Take on Wednesday.  2  . warfarin (COUMADIN) 7.5 MG tablet Take 7.5 mg by mouth See admin instructions. Nancy Fetter Mon Tues Thurs Fri Sat    . ZETIA 10 MG tablet Take 1 tablet (10 mg total) by mouth daily. 90 tablet 3   No facility-administered medications prior to visit.     PAST MEDICAL HISTORY: Past Medical History:  Diagnosis Date  . Anxiety    takes Lorazepam daily as needed  . Arthritis   . Cancer ([place])    thyroid  .  Cataracts, bilateral    immature  . Chronic back pain    compressed vertebrae,takes Fosamax weekly  . Family history of adverse reaction to anesthesia    granddaughter gets very sick and mean  . History of blood transfusion    no abnormal reaction noted  . History of bronchitis 05/2015  . History of colon polyps    benign  . Hyperlipidemia    takes Zetia daily  . Hypertension    takes Diltiazem and Avapro daily  . Hypothyroidism    takes Synthroid daily  . Ischemic colitis ([place])    hx of-many yrs ago  . Joint pain   . Joint swelling   . OSA on CPAP    05-08-12 AHi was 46, RDI  53. titrated to   9 cm water  with 3 cm flex.-nadir 75%  . PE (pulmonary embolism)    takes Coumadin daily  . Peripheral edema   . Pneumonia 1992   hx of  . Sarcoidosis   . Shortness of breath   . Urinary frequency   . Weakness    left leg.Numbness and tingling.Has sciatica  . Wheeze    occaionally. Not new    PAST SURGICAL HISTORY: Past Surgical History:  Procedure Laterality Date  . ABDOMINAL HYSTERECTOMY    . APPENDECTOMY    . BREAST BIOPSY     biopsies on right x 2  . CHOLECYSTECTOMY N/A 11/29/2015   Procedure: LAPAROSCOPIC CHOLECYSTECTOMY;  Surgeon: [name], MD;  Location: [facility];  Service: General;  Laterality: N/A;  . COLONOSCOPY    . DILATION AND CURETTAGE OF UTERUS    . LIVER BIOPSY    . MELANOMA EXCISION     removed from left leg  . scalene surgery   1972   "trying to find out what was wrong with her lungs"  . THYROID SURGERY      FAMILY HISTORY: Family History  Problem Relation Age of Onset  . CVA Mother   . Heart attack Neg Hx     SOCIAL HISTORY: Social History   Socioeconomic History  . Marital status: Married    Spouse name: Not on file  . Number of children: Not on file  . Years of education: Not on file  . Highest education level: Not on file  Social Needs  . Financial resource strain: Not on file  . Food insecurity - worry: Not on file  . Food insecurity - inability: Not on file  . Transportation needs - medical: Not on file  . Transportation needs - non-medical: Not on file  Occupational History  . Occupation: Retired    Fish farm manager: RETIRED  Tobacco Use  . Smoking status: Never Smoker  . Smokeless tobacco: Never Used  Substance and Sexual Activity  . Alcohol use: No  . Drug use: No  . Sexual activity: Not on file  Other Topics Concern  . Not on file  Social History Narrative  . Not on file      PHYSICAL EXAM  Vitals:   06/24/17 1015  BP: (!) 170/86  Pulse: 75  Weight: 237 lb (107.5 kg)  Height: 5\' 4"  (1.626 m)   Body mass index is 40.68 kg/m.  ankle edema, Dysphonia, nasal congestion, sinus congestion, post nasal drip.   Generalized: Well developed, in no acute distress. Abdomen- soft, enlarge - umbilical hernia without intestinal catch.   Mild wheezing.  Neck: Circumference 18 inches,  Mallampati 4. Double chin.   Neurological examination Mentation: Alert oriented to  time, place, history taking. Follows all commands speech and language fluent, with dysphonia.  Cranial nerve : change of smell and taste- foul smells ( may be related to sinus infect) , she reports taste is bland - to all flavors except salt, sweet. But sour and bitter may be reduced. Pupils were equal round reactive to light. Extraocular movements were full, visual field were full on confrontational test. Facial sensation and  strength were normal. Uvula tongue midline. Head turning and shoulder shrug  were normal and symmetric.Mallampati 3+ Motor: , symmetric motor tone and muscle bulk is noted throughout.  Sensory:  intact to primary modalities, even at ankle. She has pain in her left leg, venostatis.  Coordination: Intact finger-nose- bilaterally.  No change in handwriting, no tremor or dysmetria or ataxia. Gait and station: Gait with normal step with, turning with 3-4 steps, no assistive device..  Reflexes: Deep tendon reflexes symmetric bilaterally.   DIAGNOSTIC DATA (LABS, IMAGING, TESTING) - I reviewed patient records, labs, notes, testing and imaging myself where available.  She was evaluated for a compressed thoracic vertebra.    ASSESSMENT AND PLAN 77 y.o. year old female  has a past medical history of Anxiety, Arthritis, Cancer ([place]), Cataracts, bilateral, Chronic back pain, Family history of adverse reaction to anesthesia, History of blood transfusion, History of bronchitis (05/2015), History of colon polyps, Hyperlipidemia, Hypertension, Hypothyroidism, Ischemic colitis ([place]), Joint pain, Joint swelling, OSA on CPAP, PE (pulmonary embolism), Peripheral edema, Pneumonia (1992), Sarcoidosis, Shortness of breath, Urinary frequency, Weakness, and Wheeze. here with:   1. OSA on CPAP- highly compliant at 100% a with excellent resolution 10.6 cm, AH under 0.5 / hr.  Will need her new CPAP auto machine to be set to a similar pressure and keep her nasal pillow type.    2. Obesity , morbid- not diabetic, but HTN, no CAD.   3. Chronic sinusitis and rhinitis    She will return in 1 year with me, Dr. Brett Fairy.   Larey Seat, MD  06/24/2017, 10:25 AM [facility] [address] [place], [place] [address] [phone]   CC Dr. Philip Aspen, MD  [facility]

## 2017-06-25 ENCOUNTER — Other Ambulatory Visit: Payer: Self-pay | Admitting: Neurology

## 2017-06-25 DIAGNOSIS — Z7901 Long term (current) use of anticoagulants: Secondary | ICD-10-CM | POA: Diagnosis not present

## 2017-06-25 DIAGNOSIS — I8290 Acute embolism and thrombosis of unspecified vein: Secondary | ICD-10-CM | POA: Diagnosis not present

## 2017-06-25 DIAGNOSIS — G4733 Obstructive sleep apnea (adult) (pediatric): Secondary | ICD-10-CM

## 2017-06-26 ENCOUNTER — Ambulatory Visit
Admission: RE | Admit: 2017-06-26 | Discharge: 2017-06-26 | Disposition: A | Discharge status: Home / Self Care | Payer: Medicare HMO | Source: Ambulatory Visit | Attending: Internal Medicine | Admitting: Internal Medicine

## 2017-06-26 DIAGNOSIS — Z1231 Encounter for screening mammogram for malignant neoplasm of breast: Secondary | ICD-10-CM | POA: Diagnosis not present

## 2017-06-26 DIAGNOSIS — Z139 Encounter for screening, unspecified: Secondary | ICD-10-CM

## 2017-06-29 ENCOUNTER — Telehealth: Payer: Self-pay | Admitting: Neurology

## 2017-06-29 NOTE — Telephone Encounter (Signed)
Called the pt back spoke with the patient and made her aware that everything could be taking care of to transfer her care to [facility].

## 2017-06-29 NOTE — Telephone Encounter (Signed)
Called to make the patient aware that I was able to transfer her care of cpap and oxygen machine over to [facility]. No answer. LVM for the patient to call back. If patient calls back please advise her that [facility] was happy to accept her transfering her CPAP and oxygen services to them. Their number is [phone]. They should be reaching out to her to get everything switched over. Please give those numbers so that she may call and follow up if needed.

## 2017-06-29 NOTE — Telephone Encounter (Signed)
Pt is returning call and wanting to speak with RN. Pt was on hold while I was getting in touch with RN not sure if pt hung up or got disconnected

## 2017-07-09 ENCOUNTER — Other Ambulatory Visit: Payer: Self-pay | Admitting: Interventional Cardiology

## 2017-07-15 ENCOUNTER — Encounter: Payer: Self-pay | Admitting: Interventional Cardiology

## 2017-07-15 DIAGNOSIS — J111 Influenza due to unidentified influenza virus with other respiratory manifestations: Secondary | ICD-10-CM | POA: Diagnosis not present

## 2017-07-15 DIAGNOSIS — R509 Fever, unspecified: Secondary | ICD-10-CM | POA: Diagnosis not present

## 2017-07-15 DIAGNOSIS — R05 Cough: Secondary | ICD-10-CM | POA: Diagnosis not present

## 2017-07-15 DIAGNOSIS — I10 Essential (primary) hypertension: Secondary | ICD-10-CM | POA: Diagnosis not present

## 2017-07-15 DIAGNOSIS — I8290 Acute embolism and thrombosis of unspecified vein: Secondary | ICD-10-CM | POA: Diagnosis not present

## 2017-07-15 DIAGNOSIS — J302 Other seasonal allergic rhinitis: Secondary | ICD-10-CM | POA: Diagnosis not present

## 2017-07-15 DIAGNOSIS — Z6841 Body Mass Index (BMI) 40.0 and over, adult: Secondary | ICD-10-CM | POA: Diagnosis not present

## 2017-07-15 DIAGNOSIS — Z7901 Long term (current) use of anticoagulants: Secondary | ICD-10-CM | POA: Diagnosis not present

## 2017-07-20 NOTE — Progress Notes (Signed)
Cardiology Office Note   Date:  07/22/2017   ID:  SCOTT FIX, DOB 02-05-1941, MRN [id number]  PCP:  Leanna Battles, MD    No chief complaint on file.  HTN  Wt Readings from Last 3 Encounters:  07/22/17 231 lb 12.8 oz (105.1 kg)  06/24/17 237 lb (107.5 kg)  05/21/16 235 lb 6.4 oz (106.8 kg)       History of Present Illness: [name] is a 77 y.o. female  with a h/o of DVT/PE. SHe was treated with Coumadin for 6 months. No reason found for DVTs. She had a stress test in 4/09 which was negative for ischemia. She exercised for 6:15 seconds. She had IBS but in 4/10, had persistent pain and had blood in her stool while off coumadin, she was diagnosed with ischemic colitis. Later, she states she was diagnosed with polycythemia, which was being managed by Dr. Jamse Arn. She was also placed on Xarelto, after a CT scan but then switched back to Coumadin.;.  BP has been well controlled. See below. Readings typically higher in the MDs office.   He felt poorly off of brand name Avapro. Has tried losartan and irbesartan. BP was not well controlled off of Avapro. Cough resolved.  She has gained weight.  She had a skin cancer removal also.  She required some ABx at that time.   Cholecystectomy done in 6/17.  Did well.   She has done well.  She had the flu, but per her report did not take Tamiflu.  She has some residual wheezing.  She took an antibiotic and some prednisone.    Past Medical History:  Diagnosis Date  . Anxiety    takes Lorazepam daily as needed  . Arthritis   . Cancer ([place])    thyroid  . Cataracts, bilateral    immature  . Chronic back pain    compressed vertebrae,takes Fosamax weekly  . Family history of adverse reaction to anesthesia    granddaughter gets very sick and mean  . History of blood transfusion    no abnormal reaction noted  . History of bronchitis 05/2015  . History of colon polyps    benign  . Hyperlipidemia    takes Zetia  daily  . Hypertension    takes Diltiazem and Avapro daily  . Hypothyroidism    takes Synthroid daily  . Ischemic colitis ([place])    hx of-many yrs ago  . Joint pain   . Joint swelling   . OSA on CPAP    05-08-12 AHi was 46, RDI  53. titrated to   9 cm water  with 3 cm flex.-nadir 75%  . PE (pulmonary embolism)    takes Coumadin daily  . Peripheral edema   . Pneumonia 1992   hx of  . Sarcoidosis   . Shortness of breath   . Urinary frequency   . Weakness    left leg.Numbness and tingling.Has sciatica  . Wheeze    occaionally. Not new    Past Surgical History:  Procedure Laterality Date  . ABDOMINAL HYSTERECTOMY    . APPENDECTOMY    . BREAST BIOPSY     biopsies on right x 2  . BREAST CYST ASPIRATION Left   . CHOLECYSTECTOMY N/A 11/29/2015   Procedure: LAPAROSCOPIC CHOLECYSTECTOMY;  Surgeon: [name], MD;  Location: [facility];  Service: General;  Laterality: N/A;  . COLONOSCOPY    . DILATION AND CURETTAGE OF UTERUS    . LIVER BIOPSY    .  MELANOMA EXCISION     removed from left leg  . scalene surgery  1972   "trying to find out what was wrong with her lungs"  . THYROID SURGERY       Current Outpatient Medications  Medication Sig Dispense Refill  . alendronate (FOSAMAX) 70 MG tablet Take 70 mg by mouth once a week.     . AVAPRO 300 MG tablet Take 0.5 tablets (150 mg total) by mouth daily. Please keep upcoming appointment for further refills 45 tablet 0  . Carboxymethylcellulose Sodium (REFRESH LIQUIGEL OP) Place 1 drop into both eyes at bedtime.    Marland Kitchen diltiazem (TIAZAC) 360 MG 24 hr capsule Take 1 capsule by mouth daily.    . fluticasone (FLONASE) 50 MCG/ACT nasal spray Place 1 spray into the nose daily as needed for allergies. For allergies.    Marland Kitchen levothyroxine (SYNTHROID, LEVOTHROID) 100 MCG tablet Take 100 mcg by mouth daily before breakfast.   1  . LORazepam (ATIVAN) 1 MG tablet Take 1 mg by mouth as needed for anxiety.     . Lutein-Zeaxanthin 25-5 MG CAPS Take 1 tablet  by mouth daily. EYE VITAMIN    . nystatin cream (MYCOSTATIN) Apply 1 application topically daily.   1  . oseltamivir (TAMIFLU) 75 MG capsule Take 75 mg by mouth 2 (two) times daily.    Marland Kitchen warfarin (COUMADIN) 5 MG tablet Take 5 mg by mouth every Wednesday. Take on Wednesday.  2  . warfarin (COUMADIN) 7.5 MG tablet Take 7.5 mg by mouth See admin instructions. Sun Mon Tues Thurs Fri Sat     No current facility-administered medications for this visit.     Allergies:   Codeine; Erythromycin; Tetracyclines & related; Statins; and Latex    Social History:  The patient  reports that  has never smoked. she has never used smokeless tobacco. She reports that she does not drink alcohol or use drugs.   Family History:  The patient's family history includes CVA in her mother.    ROS:  Please see the history of present illness.   Otherwise, review of systems are positive for recent flu.   All other systems are reviewed and negative.    PHYSICAL EXAM: VS:  BP 140/86   Pulse 67   Ht 5\' 4"  (1.626 m)   Wt 231 lb 12.8 oz (105.1 kg)   SpO2 92%   BMI 39.79 kg/m  , BMI Body mass index is 39.79 kg/m. GEN: Well nourished, well developed, in no acute distress  HEENT: normal  Neck: no JVD, carotid bruits, or masses Cardiac: RRR; no murmurs, rubs, or gallops,no edema  Respiratory:  clear to auscultation bilaterally, normal work of breathing GI: soft, nontender, nondistended, + BS MS: no deformity or atrophy  Skin: warm and dry, no rash Neuro:  Strength and sensation are intact Psych: euthymic mood, full affect   EKG:   The ekg ordered today demonstrates NSR, no ST changes   Recent Labs: No results found for requested labs within last 8760 hours.   Lipid Panel No results found for: CHOL, TRIG, HDL, CHOLHDL, VLDL, LDLCALC, LDLDIRECT   Other studies Reviewed: Additional studies/ records that were reviewed today with results demonstrating: .   ASSESSMENT AND PLAN:  1. HTN : Well controlled  today.  Continue current meds.  Still needs the brand name Avapro.   2. Hyperlipidemia: She took Zetia in the past but stopped this when there was no outcomes data.  Trying to manage cholesterol with  healthy diet.  SHe has been intolerant of statins in the past.  She is not interested in PCSK9 inhibitor due to the cost and I am not sure that she would qualify. 3. Obesity: Mild fluctuation in weight.  Continue to try to walk and eat healthy foods, while avoiding added salt and sugar to her diet.  4. Anticoagulated: No bleeding problems.  COntiue Coumadin.  INR to be checked at primary care doctor given that she was recently. 5. Wearing oxygen at night.  Using CPAP as well.    Current medicines are reviewed at length with the patient today.  The patient concerns regarding her medicines were addressed.  The following changes have been made:  No change  Labs/ tests ordered today include:  No orders of the defined types were placed in this encounter.   Recommend 150 minutes/week of aerobic exercise Low fat, low carb, high fiber diet recommended  Disposition:   FU in 1 year   Signed, Larae Grooms, MD  07/22/2017 10:17 AM    [facility] [place], [place], [place]  [address] Phone: [phone]; Fax: [phone]

## 2017-07-22 ENCOUNTER — Encounter: Payer: Self-pay | Admitting: Interventional Cardiology

## 2017-07-22 ENCOUNTER — Ambulatory Visit: Payer: Medicare HMO | Admitting: Interventional Cardiology

## 2017-07-22 VITALS — BP 140/86 | HR 67 | Ht 64.0 in | Wt 231.8 lb

## 2017-07-22 DIAGNOSIS — E782 Mixed hyperlipidemia: Secondary | ICD-10-CM | POA: Diagnosis not present

## 2017-07-22 DIAGNOSIS — G4733 Obstructive sleep apnea (adult) (pediatric): Secondary | ICD-10-CM | POA: Diagnosis not present

## 2017-07-22 DIAGNOSIS — Z7901 Long term (current) use of anticoagulants: Secondary | ICD-10-CM

## 2017-07-22 DIAGNOSIS — I10 Essential (primary) hypertension: Secondary | ICD-10-CM | POA: Diagnosis not present

## 2017-07-22 NOTE — Patient Instructions (Signed)

## 2017-07-24 DIAGNOSIS — R05 Cough: Secondary | ICD-10-CM | POA: Diagnosis not present

## 2017-07-24 DIAGNOSIS — J111 Influenza due to unidentified influenza virus with other respiratory manifestations: Secondary | ICD-10-CM | POA: Diagnosis not present

## 2017-07-24 DIAGNOSIS — Z7901 Long term (current) use of anticoagulants: Secondary | ICD-10-CM | POA: Diagnosis not present

## 2017-07-27 DIAGNOSIS — R0602 Shortness of breath: Secondary | ICD-10-CM | POA: Diagnosis not present

## 2017-07-30 DIAGNOSIS — I8290 Acute embolism and thrombosis of unspecified vein: Secondary | ICD-10-CM | POA: Diagnosis not present

## 2017-07-30 DIAGNOSIS — Z7901 Long term (current) use of anticoagulants: Secondary | ICD-10-CM | POA: Diagnosis not present

## 2017-08-10 ENCOUNTER — Telehealth: Payer: Self-pay

## 2017-08-10 ENCOUNTER — Other Ambulatory Visit: Payer: Self-pay

## 2017-08-10 NOTE — Telephone Encounter (Signed)
**Note De-Identified Jadarrius Maselli Obfuscation** The pt states that she called the office on Friday to request that a PA be done on her Avapro through her new [employer].  She is advised that I am unaware of this request but that I will address as soon as I can.  She verbalized understanding and thanked me for my help.

## 2017-08-11 ENCOUNTER — Telehealth: Payer: Self-pay | Admitting: Neurology

## 2017-08-11 NOTE — Telephone Encounter (Signed)
I have informed the patient that particular request usually comes from the company that she has her oxygenator through. She gets this through [place]. I informed her that normally [facility] would send [place] something and then we would sign and return to them for them to turn into insurance. The patient is having a lot of difficulty with [facility]. I have reached out to [facility] because the patient states that she has not had her CPAP transferred over yet. I dont know if they can take on both her CPAP and oxygen concentrator but I have reached out to [facility] in hopes that they can and possibly help the patient with her situation. [facility] states that they will contact her today and they will discuss transferring her oxygen care as well. Pt verbalized understanding and will wait to hear from [facility]

## 2017-08-11 NOTE — Telephone Encounter (Signed)
Pt called [name] is requiring a certification letter for oxygen. Please call to advise

## 2017-08-20 NOTE — Telephone Encounter (Signed)
Received fax from [place] with an approval for [facility], dates 06/07/2017-06/08/2018. [place] notified.

## 2017-09-02 DIAGNOSIS — I8290 Acute embolism and thrombosis of unspecified vein: Secondary | ICD-10-CM | POA: Diagnosis not present

## 2017-09-02 DIAGNOSIS — Z7901 Long term (current) use of anticoagulants: Secondary | ICD-10-CM | POA: Diagnosis not present

## 2017-10-07 ENCOUNTER — Other Ambulatory Visit: Payer: Self-pay | Admitting: Interventional Cardiology

## 2017-10-07 DIAGNOSIS — I8290 Acute embolism and thrombosis of unspecified vein: Secondary | ICD-10-CM | POA: Diagnosis not present

## 2017-10-07 DIAGNOSIS — Z7901 Long term (current) use of anticoagulants: Secondary | ICD-10-CM | POA: Diagnosis not present

## 2017-10-07 DIAGNOSIS — R05 Cough: Secondary | ICD-10-CM | POA: Diagnosis not present

## 2017-10-16 DIAGNOSIS — R1084 Generalized abdominal pain: Secondary | ICD-10-CM | POA: Diagnosis not present

## 2017-10-16 DIAGNOSIS — Z1211 Encounter for screening for malignant neoplasm of colon: Secondary | ICD-10-CM | POA: Diagnosis not present

## 2017-10-19 DIAGNOSIS — R05 Cough: Secondary | ICD-10-CM | POA: Diagnosis not present

## 2017-10-19 DIAGNOSIS — I8290 Acute embolism and thrombosis of unspecified vein: Secondary | ICD-10-CM | POA: Diagnosis not present

## 2017-10-19 DIAGNOSIS — Z6841 Body Mass Index (BMI) 40.0 and over, adult: Secondary | ICD-10-CM | POA: Diagnosis not present

## 2017-10-19 DIAGNOSIS — I10 Essential (primary) hypertension: Secondary | ICD-10-CM | POA: Diagnosis not present

## 2017-10-19 DIAGNOSIS — J302 Other seasonal allergic rhinitis: Secondary | ICD-10-CM | POA: Diagnosis not present

## 2017-10-19 DIAGNOSIS — J181 Lobar pneumonia, unspecified organism: Secondary | ICD-10-CM | POA: Diagnosis not present

## 2017-10-19 DIAGNOSIS — Z86711 Personal history of pulmonary embolism: Secondary | ICD-10-CM | POA: Diagnosis not present

## 2017-10-19 DIAGNOSIS — Z7901 Long term (current) use of anticoagulants: Secondary | ICD-10-CM | POA: Diagnosis not present

## 2017-10-22 DIAGNOSIS — Z1211 Encounter for screening for malignant neoplasm of colon: Secondary | ICD-10-CM | POA: Diagnosis not present

## 2017-10-22 DIAGNOSIS — D126 Benign neoplasm of colon, unspecified: Secondary | ICD-10-CM | POA: Diagnosis not present

## 2017-10-22 DIAGNOSIS — K5730 Diverticulosis of large intestine without perforation or abscess without bleeding: Secondary | ICD-10-CM | POA: Diagnosis not present

## 2017-10-27 DIAGNOSIS — D126 Benign neoplasm of colon, unspecified: Secondary | ICD-10-CM | POA: Diagnosis not present

## 2017-11-05 DIAGNOSIS — Z7901 Long term (current) use of anticoagulants: Secondary | ICD-10-CM | POA: Diagnosis not present

## 2017-11-05 DIAGNOSIS — E041 Nontoxic single thyroid nodule: Secondary | ICD-10-CM | POA: Diagnosis not present

## 2017-11-05 DIAGNOSIS — I10 Essential (primary) hypertension: Secondary | ICD-10-CM | POA: Diagnosis not present

## 2017-11-05 DIAGNOSIS — R82998 Other abnormal findings in urine: Secondary | ICD-10-CM | POA: Diagnosis not present

## 2017-11-05 DIAGNOSIS — E559 Vitamin D deficiency, unspecified: Secondary | ICD-10-CM | POA: Diagnosis not present

## 2017-11-05 DIAGNOSIS — E782 Mixed hyperlipidemia: Secondary | ICD-10-CM | POA: Diagnosis not present

## 2017-11-06 DIAGNOSIS — R69 Illness, unspecified: Secondary | ICD-10-CM | POA: Diagnosis not present

## 2017-11-06 DIAGNOSIS — G4730 Sleep apnea, unspecified: Secondary | ICD-10-CM | POA: Diagnosis not present

## 2017-11-06 DIAGNOSIS — E039 Hypothyroidism, unspecified: Secondary | ICD-10-CM | POA: Diagnosis not present

## 2017-11-06 DIAGNOSIS — Z6841 Body Mass Index (BMI) 40.0 and over, adult: Secondary | ICD-10-CM | POA: Diagnosis not present

## 2017-11-06 DIAGNOSIS — I499 Cardiac arrhythmia, unspecified: Secondary | ICD-10-CM | POA: Diagnosis not present

## 2017-11-06 DIAGNOSIS — I10 Essential (primary) hypertension: Secondary | ICD-10-CM | POA: Diagnosis not present

## 2017-11-06 DIAGNOSIS — I739 Peripheral vascular disease, unspecified: Secondary | ICD-10-CM | POA: Diagnosis not present

## 2017-11-06 DIAGNOSIS — H3530 Unspecified macular degeneration: Secondary | ICD-10-CM | POA: Diagnosis not present

## 2017-11-12 ENCOUNTER — Ambulatory Visit (INDEPENDENT_AMBULATORY_CARE_PROVIDER_SITE_OTHER): Payer: Medicare Other | Admitting: Ophthalmology

## 2017-11-12 DIAGNOSIS — L570 Actinic keratosis: Secondary | ICD-10-CM | POA: Diagnosis not present

## 2017-11-12 DIAGNOSIS — E782 Mixed hyperlipidemia: Secondary | ICD-10-CM | POA: Diagnosis not present

## 2017-11-12 DIAGNOSIS — E559 Vitamin D deficiency, unspecified: Secondary | ICD-10-CM | POA: Diagnosis not present

## 2017-11-12 DIAGNOSIS — Z1389 Encounter for screening for other disorder: Secondary | ICD-10-CM | POA: Diagnosis not present

## 2017-11-12 DIAGNOSIS — R69 Illness, unspecified: Secondary | ICD-10-CM | POA: Diagnosis not present

## 2017-11-12 DIAGNOSIS — Z7901 Long term (current) use of anticoagulants: Secondary | ICD-10-CM | POA: Diagnosis not present

## 2017-11-12 DIAGNOSIS — J181 Lobar pneumonia, unspecified organism: Secondary | ICD-10-CM | POA: Diagnosis not present

## 2017-11-12 DIAGNOSIS — Z0000 Encounter for general adult medical examination without abnormal findings: Secondary | ICD-10-CM | POA: Diagnosis not present

## 2017-11-12 DIAGNOSIS — I10 Essential (primary) hypertension: Secondary | ICD-10-CM | POA: Diagnosis not present

## 2017-11-13 ENCOUNTER — Encounter (INDEPENDENT_AMBULATORY_CARE_PROVIDER_SITE_OTHER): Payer: Medicare HMO | Admitting: Ophthalmology

## 2017-11-13 DIAGNOSIS — I10 Essential (primary) hypertension: Secondary | ICD-10-CM

## 2017-11-13 DIAGNOSIS — H353132 Nonexudative age-related macular degeneration, bilateral, intermediate dry stage: Secondary | ICD-10-CM | POA: Diagnosis not present

## 2017-11-13 DIAGNOSIS — H33301 Unspecified retinal break, right eye: Secondary | ICD-10-CM | POA: Diagnosis not present

## 2017-11-13 DIAGNOSIS — H35033 Hypertensive retinopathy, bilateral: Secondary | ICD-10-CM | POA: Diagnosis not present

## 2017-11-13 DIAGNOSIS — H2513 Age-related nuclear cataract, bilateral: Secondary | ICD-10-CM

## 2017-11-13 DIAGNOSIS — H43813 Vitreous degeneration, bilateral: Secondary | ICD-10-CM

## 2017-12-02 DIAGNOSIS — I10 Essential (primary) hypertension: Secondary | ICD-10-CM | POA: Diagnosis not present

## 2017-12-02 DIAGNOSIS — Z86711 Personal history of pulmonary embolism: Secondary | ICD-10-CM | POA: Diagnosis not present

## 2017-12-02 DIAGNOSIS — I8290 Acute embolism and thrombosis of unspecified vein: Secondary | ICD-10-CM | POA: Diagnosis not present

## 2017-12-02 DIAGNOSIS — Z7901 Long term (current) use of anticoagulants: Secondary | ICD-10-CM | POA: Diagnosis not present

## 2017-12-04 DIAGNOSIS — I10 Essential (primary) hypertension: Secondary | ICD-10-CM | POA: Diagnosis not present

## 2017-12-18 ENCOUNTER — Encounter (INDEPENDENT_AMBULATORY_CARE_PROVIDER_SITE_OTHER): Payer: Medicare HMO | Admitting: Ophthalmology

## 2018-01-04 DIAGNOSIS — Z6841 Body Mass Index (BMI) 40.0 and over, adult: Secondary | ICD-10-CM | POA: Diagnosis not present

## 2018-01-04 DIAGNOSIS — M25461 Effusion, right knee: Secondary | ICD-10-CM | POA: Diagnosis not present

## 2018-01-04 DIAGNOSIS — Z7901 Long term (current) use of anticoagulants: Secondary | ICD-10-CM | POA: Diagnosis not present

## 2018-01-04 DIAGNOSIS — M25561 Pain in right knee: Secondary | ICD-10-CM | POA: Diagnosis not present

## 2018-01-04 DIAGNOSIS — M859 Disorder of bone density and structure, unspecified: Secondary | ICD-10-CM | POA: Diagnosis not present

## 2018-01-04 DIAGNOSIS — I8290 Acute embolism and thrombosis of unspecified vein: Secondary | ICD-10-CM | POA: Diagnosis not present

## 2018-01-12 ENCOUNTER — Ambulatory Visit (INDEPENDENT_AMBULATORY_CARE_PROVIDER_SITE_OTHER): Payer: Medicare HMO

## 2018-01-12 ENCOUNTER — Encounter (INDEPENDENT_AMBULATORY_CARE_PROVIDER_SITE_OTHER): Payer: Self-pay | Admitting: Physician Assistant

## 2018-01-12 ENCOUNTER — Ambulatory Visit (INDEPENDENT_AMBULATORY_CARE_PROVIDER_SITE_OTHER): Payer: Medicare HMO | Admitting: Physician Assistant

## 2018-01-12 DIAGNOSIS — M25561 Pain in right knee: Secondary | ICD-10-CM

## 2018-01-12 MED ORDER — METHYLPREDNISOLONE ACETATE 40 MG/ML IJ SUSP
40.0000 mg | INTRAMUSCULAR | Status: AC | PRN
  Administered 2018-01-12: 40 mg via INTRA_ARTICULAR

## 2018-01-12 MED ORDER — BUPIVACAINE HCL 0.25 % IJ SOLN
2.0000 mL | INTRAMUSCULAR | Status: AC | PRN
  Administered 2018-01-12: 2 mL via INTRA_ARTICULAR

## 2018-01-12 MED ORDER — LIDOCAINE HCL 1 % IJ SOLN
2.0000 mL | INTRAMUSCULAR | Status: AC | PRN
  Administered 2018-01-12: 2 mL

## 2018-01-12 NOTE — Progress Notes (Signed)
Office Visit Note   Patient: [name]           Date of Birth: 02-19-1941           MRN: [id number] Visit Date: 01/12/2018              Requested by: Leanna Battles, MD [address] [place], [place] [address] PCP: Leanna Battles, MD   Assessment & Plan: Visit Diagnoses:  1. Acute pain of right knee     Plan: Impression is right knee degenerative medial meniscus tear versus reactive synovitis.  Today, we injected the right knee with cortisone.  She is to take it easy over the next few days.  She will follow-up with [place] as needed.  Call if concerns or questions in the meantime.  Follow-Up Instructions: Return if symptoms worsen or fail to improve.   Orders:  Orders Placed This Encounter  Procedures  . Large Joint Inj: R knee  . XR KNEE 3 VIEW RIGHT   No orders of the defined types were placed in this encounter.     Procedures: Large Joint Inj: R knee on 01/12/2018 4:03 PM Indications: pain Details: 22 G needle, anterolateral approach Medications: 2 mL lidocaine 1 %; 2 mL bupivacaine 0.25 %; 40 mg methylPREDNISolone acetate 40 MG/ML      Clinical Data: No additional findings.   Subjective: Chief Complaint  Patient presents with  . Right Knee - Pain    HPI patient is a pleasant 77 year old female who presents to our clinic today with right knee pain.  This began approximately 2-1/2 weeks ago.  She was walking down a steep hill when all of a sudden she jolted her right knee.  Since then she has had pain to the entire aspect.  She describes this as a constant throb worse with extending the leg as well as pivoting of the knee.  She does get mild relief when she flexes the knee.  She has been using ice, heat as well as NSAIDs without relief of symptoms.  No previous cortisone injection to the right knee.  Review of Systems as detailed in HPI.  All others reviewed and are negative.   Objective: Vital Signs: There were no vitals taken for this  visit.  Physical Exam well-developed well-nourished female in no acute distress.  Alert and oriented x3.  Ortho Exam examination of the right knee shows trace effusion.  Varus thrust.  Range of motion 5 to 90 degrees.  Marked tenderness medial joint line.  Minimal patellofemoral crepitus.  Stable valgus varus stress.  She is neurovascularly intact distally.  Specialty Comments:  No specialty comments available.  Imaging: Xr Knee 3 View Right  Result Date: 01/12/2018 X-rays show marked joint space narrowing medial compartment    PMFS History: Patient Active Problem List   Diagnosis Date Noted  . Acute pain of right knee 01/12/2018  . Hypercoagulation syndrome ([place]) 05/24/2014  . Hunter's glossitis 05/24/2014  . Sarcoidosis of lung ([place]) 05/24/2014  . Hypoxemia 05/24/2014  . Essential hypertension, benign 10/13/2013  . Nocturnal hypoxia 05/19/2013  . Obstructive sleep apnea (adult) (pediatric) 05/19/2013  . Obesity 05/19/2013  . OSA on CPAP   . Adenopathy 05/26/2012  . Cough 05/20/2012  . OSA (obstructive sleep apnea) 05/20/2012  . Pulmonary embolism ([place]) 04/29/2012  . DVT (deep venous thrombosis) ([place]) 04/29/2012   Past Medical History:  Diagnosis Date  . Anxiety    takes Lorazepam daily as needed  . Arthritis   . Cancer (  [place])    thyroid  . Cataracts, bilateral    immature  . Chronic back pain    compressed vertebrae,takes Fosamax weekly  . Family history of adverse reaction to anesthesia    granddaughter gets very sick and mean  . History of blood transfusion    no abnormal reaction noted  . History of bronchitis 05/2015  . History of colon polyps    benign  . Hyperlipidemia    takes Zetia daily  . Hypertension    takes Diltiazem and Avapro daily  . Hypothyroidism    takes Synthroid daily  . Ischemic colitis ([place])    hx of-many yrs ago  . Joint pain   . Joint swelling   . OSA on CPAP    05-08-12 AHi was 46, RDI  53. titrated to   9 cm water  with 3 cm  flex.-nadir 75%  . PE (pulmonary embolism)    takes Coumadin daily  . Peripheral edema   . Pneumonia 1992   hx of  . Sarcoidosis   . Shortness of breath   . Urinary frequency   . Weakness    left leg.Numbness and tingling.Has sciatica  . Wheeze    occaionally. Not new    Family History  Problem Relation Age of Onset  . CVA Mother   . Heart attack Neg Hx     Past Surgical History:  Procedure Laterality Date  . ABDOMINAL HYSTERECTOMY    . APPENDECTOMY    . BREAST BIOPSY     biopsies on right x 2  . BREAST CYST ASPIRATION Left   . CHOLECYSTECTOMY N/A 11/29/2015   Procedure: LAPAROSCOPIC CHOLECYSTECTOMY;  Surgeon: [name], MD;  Location: [facility];  Service: General;  Laterality: N/A;  . COLONOSCOPY    . DILATION AND CURETTAGE OF UTERUS    . LIVER BIOPSY    . MELANOMA EXCISION     removed from left leg  . scalene surgery  1972   "trying to find out what was wrong with her lungs"  . THYROID SURGERY     Social History   Occupational History  . Occupation: Retired    Fish farm manager: RETIRED  Tobacco Use  . Smoking status: Never Smoker  . Smokeless tobacco: Never Used  Substance and Sexual Activity  . Alcohol use: No  . Drug use: No  . Sexual activity: Not on file

## 2018-01-27 DIAGNOSIS — I8312 Varicose veins of left lower extremity with inflammation: Secondary | ICD-10-CM | POA: Diagnosis not present

## 2018-01-27 DIAGNOSIS — I8311 Varicose veins of right lower extremity with inflammation: Secondary | ICD-10-CM | POA: Diagnosis not present

## 2018-01-27 DIAGNOSIS — I872 Venous insufficiency (chronic) (peripheral): Secondary | ICD-10-CM | POA: Diagnosis not present

## 2018-01-27 DIAGNOSIS — L853 Xerosis cutis: Secondary | ICD-10-CM | POA: Diagnosis not present

## 2018-01-27 DIAGNOSIS — L218 Other seborrheic dermatitis: Secondary | ICD-10-CM | POA: Diagnosis not present

## 2018-01-27 DIAGNOSIS — L570 Actinic keratosis: Secondary | ICD-10-CM | POA: Diagnosis not present

## 2018-01-27 DIAGNOSIS — L821 Other seborrheic keratosis: Secondary | ICD-10-CM | POA: Diagnosis not present

## 2018-02-04 DIAGNOSIS — J181 Lobar pneumonia, unspecified organism: Secondary | ICD-10-CM | POA: Diagnosis not present

## 2018-02-04 DIAGNOSIS — Z23 Encounter for immunization: Secondary | ICD-10-CM | POA: Diagnosis not present

## 2018-02-04 DIAGNOSIS — I8290 Acute embolism and thrombosis of unspecified vein: Secondary | ICD-10-CM | POA: Diagnosis not present

## 2018-02-04 DIAGNOSIS — Z7901 Long term (current) use of anticoagulants: Secondary | ICD-10-CM | POA: Diagnosis not present

## 2018-02-26 ENCOUNTER — Telehealth (INDEPENDENT_AMBULATORY_CARE_PROVIDER_SITE_OTHER): Payer: Self-pay

## 2018-02-26 NOTE — Telephone Encounter (Signed)
Please submit for gel inj for right knee- Dr Erlinda Hong

## 2018-02-26 NOTE — Telephone Encounter (Signed)
Noted  

## 2018-03-04 ENCOUNTER — Telehealth (INDEPENDENT_AMBULATORY_CARE_PROVIDER_SITE_OTHER): Payer: Self-pay

## 2018-03-04 NOTE — Telephone Encounter (Signed)
Submitted VOB for Monovisc, right knee. 

## 2018-03-11 DIAGNOSIS — I8290 Acute embolism and thrombosis of unspecified vein: Secondary | ICD-10-CM | POA: Diagnosis not present

## 2018-03-11 DIAGNOSIS — Z7901 Long term (current) use of anticoagulants: Secondary | ICD-10-CM | POA: Diagnosis not present

## 2018-03-12 ENCOUNTER — Telehealth (INDEPENDENT_AMBULATORY_CARE_PROVIDER_SITE_OTHER): Payer: Self-pay

## 2018-03-12 NOTE — Telephone Encounter (Signed)
PA required for Monovisc, right knee. Initiated PA with [employer].  PA form will be faxed today.

## 2018-03-16 ENCOUNTER — Ambulatory Visit (INDEPENDENT_AMBULATORY_CARE_PROVIDER_SITE_OTHER): Payer: Medicare HMO | Admitting: Orthopaedic Surgery

## 2018-03-16 ENCOUNTER — Encounter (INDEPENDENT_AMBULATORY_CARE_PROVIDER_SITE_OTHER): Payer: Self-pay | Admitting: Orthopaedic Surgery

## 2018-03-16 DIAGNOSIS — M25561 Pain in right knee: Secondary | ICD-10-CM

## 2018-03-16 DIAGNOSIS — M545 Low back pain, unspecified: Secondary | ICD-10-CM

## 2018-03-16 MED ORDER — BACLOFEN 10 MG PO TABS: 10.0000 mg | ORAL_TABLET | Freq: Three times a day (TID) | ORAL | 2 refills | Status: DC | PRN

## 2018-03-16 NOTE — Progress Notes (Signed)
Office Visit Note   Patient: [name]           Date of Birth: [birth year]           MRN: [id number] Visit Date: 03/16/2018              Requested by: Leanna Battles, MD [place], [place] [address] PCP: Leanna Battles, MD   Assessment & Plan: Visit Diagnoses:  1. Acute pain of right knee   2. Acute right-sided low back pain without sciatica     Plan: Impression is continued right knee pain with partial relief from cortisone injection.  At this point recommend MRI to rule out stress fracture and structural abnormalities.  For back pain it sounds more muscular in nature therefore I have prescribed baclofen to see if this will help.  Follow-up after the MRI.  Follow-Up Instructions: Return in about 10 days (around 03/26/2018).   Orders:  Orders Placed This Encounter  Procedures  . MR Knee Right w/o contrast   Meds ordered this encounter  Medications  . baclofen (LIORESAL) 10 MG tablet    Sig: Take 1 tablet (10 mg total) by mouth 3 (three) times daily as needed for muscle spasms.    Dispense:  30 each    Refill:  2      Procedures: No procedures performed   Clinical Data: No additional findings.   Subjective: Chief Complaint  Patient presents with  . Right Knee - Pain  . Lower Back - Pain    [name] comes in today for bilateral pain worse on the left.  She states that the right knee pain has improved since the injection 2 months ago but she still continues to have pain mainly on the medial side.  The pain is worse with ambulation.  She is also complaining of right-sided back pain.  Denies any dysuria.  Denies any injuries.   Review of Systems  Constitutional: Negative.   HENT: Negative.   Eyes: Negative.   Respiratory: Negative.   Cardiovascular: Negative.   Endocrine: Negative.   Musculoskeletal: Negative.   Neurological: Negative.   Hematological: Negative.   Psychiatric/Behavioral: Negative.   All other systems reviewed and  are negative.    Objective: Vital Signs: There were no vitals taken for this visit.  Physical Exam  Constitutional: She is oriented to person, place, and time. She appears well-developed and well-nourished.  Pulmonary/Chest: Effort normal.  Neurological: She is alert and oriented to person, place, and time.  Skin: Skin is warm. Capillary refill takes less than 2 seconds.  Psychiatric: She has a normal mood and affect. Her behavior is normal. Judgment and thought content normal.  Nursing note and vitals reviewed.   Ortho Exam Right knee exam shows no joint effusion.  Collaterals and cruciates are stable.  Medial joint line tenderness. Thoracic and low back exam shows no tenderness along the spinous processes.  She has more tenderness along the right paraspinal muscles. Specialty Comments:  No specialty comments available.  Imaging: No results found.   PMFS History: Patient Active Problem List   Diagnosis Date Noted  . Acute pain of right knee 01/12/2018  . Hypercoagulation syndrome ([place]) 05/24/2014  . Hunter's glossitis 05/24/2014  . Sarcoidosis of lung ([place]) 05/24/2014  . Hypoxemia 05/24/2014  . Essential hypertension, benign 10/13/2013  . Nocturnal hypoxia 05/19/2013  . Obstructive sleep apnea (adult) (pediatric) 05/19/2013  . Obesity 05/19/2013  . OSA on CPAP   . Adenopathy 05/26/2012  . Cough  05/20/2012  . OSA (obstructive sleep apnea) 05/20/2012  . Pulmonary embolism ([place]) 04/29/2012  . DVT (deep venous thrombosis) ([place]) 04/29/2012   Past Medical History:  Diagnosis Date  . Anxiety    takes Lorazepam daily as needed  . Arthritis   . Cancer ([place])    thyroid  . Cataracts, bilateral    immature  . Chronic back pain    compressed vertebrae,takes Fosamax weekly  . Family history of adverse reaction to anesthesia    granddaughter gets very sick and mean  . History of blood transfusion    no abnormal reaction noted  . History of bronchitis 05/2015  . History  of colon polyps    benign  . Hyperlipidemia    takes Zetia daily  . Hypertension    takes Diltiazem and Avapro daily  . Hypothyroidism    takes Synthroid daily  . Ischemic colitis ([place])    hx of-many yrs ago  . Joint pain   . Joint swelling   . OSA on CPAP    05-08-12 AHi was 46, RDI  53. titrated to   9 cm water  with 3 cm flex.-nadir 75%  . PE (pulmonary embolism)    takes Coumadin daily  . Peripheral edema   . Pneumonia 1992   hx of  . Sarcoidosis   . Shortness of breath   . Urinary frequency   . Weakness    left leg.Numbness and tingling.Has sciatica  . Wheeze    occaionally. Not new    Family History  Problem Relation Age of Onset  . CVA Mother   . Heart attack Neg Hx     Past Surgical History:  Procedure Laterality Date  . ABDOMINAL HYSTERECTOMY    . APPENDECTOMY    . BREAST BIOPSY     biopsies on right x 2  . BREAST CYST ASPIRATION Left   . CHOLECYSTECTOMY N/A 11/29/2015   Procedure: LAPAROSCOPIC CHOLECYSTECTOMY;  Surgeon: [name], MD;  Location: [facility];  Service: General;  Laterality: N/A;  . COLONOSCOPY    . DILATION AND CURETTAGE OF UTERUS    . LIVER BIOPSY    . MELANOMA EXCISION     removed from left leg  . scalene surgery  1972   "trying to find out what was wrong with her lungs"  . THYROID SURGERY     Social History   Occupational History  . Occupation: Retired    Fish farm manager: RETIRED  Tobacco Use  . Smoking status: Never Smoker  . Smokeless tobacco: Never Used  Substance and Sexual Activity  . Alcohol use: No  . Drug use: No  . Sexual activity: Not on file

## 2018-03-18 DIAGNOSIS — H25013 Cortical age-related cataract, bilateral: Secondary | ICD-10-CM | POA: Diagnosis not present

## 2018-03-18 DIAGNOSIS — H35033 Hypertensive retinopathy, bilateral: Secondary | ICD-10-CM | POA: Diagnosis not present

## 2018-03-18 DIAGNOSIS — H2513 Age-related nuclear cataract, bilateral: Secondary | ICD-10-CM | POA: Diagnosis not present

## 2018-03-18 DIAGNOSIS — H353122 Nonexudative age-related macular degeneration, left eye, intermediate dry stage: Secondary | ICD-10-CM | POA: Diagnosis not present

## 2018-03-19 ENCOUNTER — Telehealth (INDEPENDENT_AMBULATORY_CARE_PROVIDER_SITE_OTHER): Payer: Self-pay

## 2018-03-19 NOTE — Telephone Encounter (Signed)
PA required for Monovisc, right knee. Completed PA form and faxed to [employer] at [phone].

## 2018-03-24 ENCOUNTER — Telehealth (INDEPENDENT_AMBULATORY_CARE_PROVIDER_SITE_OTHER): Payer: Self-pay

## 2018-03-24 NOTE — Telephone Encounter (Signed)
Talked with patient and advised her that she is approved for Monovisc, right knee. [place] Patient will be responsible for 20% OOP. Co-pay $25.00 PA required PA Approval# [id number] Valid 03/19/2018- 06/19/2018  Patient stated she will talk with Dr. Erlinda Hong at her appt.on 03/31/2018 for MRI to see if he would like for her to have gel injection on that day.

## 2018-03-26 ENCOUNTER — Ambulatory Visit
Admission: RE | Admit: 2018-03-26 | Discharge: 2018-03-26 | Disposition: A | Discharge status: Home / Self Care | Payer: Medicare HMO | Source: Ambulatory Visit | Attending: Orthopaedic Surgery | Admitting: Orthopaedic Surgery

## 2018-03-26 DIAGNOSIS — M25561 Pain in right knee: Secondary | ICD-10-CM

## 2018-03-26 DIAGNOSIS — S83241A Other tear of medial meniscus, current injury, right knee, initial encounter: Secondary | ICD-10-CM | POA: Diagnosis not present

## 2018-03-31 ENCOUNTER — Encounter (INDEPENDENT_AMBULATORY_CARE_PROVIDER_SITE_OTHER): Payer: Self-pay | Admitting: Orthopaedic Surgery

## 2018-03-31 ENCOUNTER — Ambulatory Visit (INDEPENDENT_AMBULATORY_CARE_PROVIDER_SITE_OTHER): Payer: Medicare HMO | Admitting: Orthopaedic Surgery

## 2018-03-31 DIAGNOSIS — S83241A Other tear of medial meniscus, current injury, right knee, initial encounter: Secondary | ICD-10-CM

## 2018-03-31 MED ORDER — LIDOCAINE HCL 1 % IJ SOLN
2.0000 mL | INTRAMUSCULAR | Status: AC | PRN
  Administered 2018-03-31: 2 mL

## 2018-03-31 MED ORDER — METHYLPREDNISOLONE ACETATE 40 MG/ML IJ SUSP
40.0000 mg | INTRAMUSCULAR | Status: AC | PRN
  Administered 2018-03-31: 40 mg via INTRA_ARTICULAR

## 2018-03-31 MED ORDER — BUPIVACAINE HCL 0.5 % IJ SOLN
2.0000 mL | INTRAMUSCULAR | Status: AC | PRN
  Administered 2018-03-31: 2 mL via INTRA_ARTICULAR

## 2018-03-31 NOTE — Progress Notes (Addendum)
Office Visit Note   Patient: [name]           Date of Birth: 1941-05-09           MRN: [id number] Visit Date: 03/31/2018              Requested by: Leanna Battles, MD [place], [place] [address] PCP: Leanna Battles, MD   Assessment & Plan: Visit Diagnoses:  1. Acute medial meniscus tear of right knee, initial encounter     Plan: MRI findings are consistent with some slight irregularity of the medial femoral condyle as well as an acute tear of the medial meniscus with medial extrusion.  These findings were discussed with the patient and together we agreed to try an aspiration and injection with Monovisc to see if this will give her any pain relief.  I would like to recheck her in 4 weeks.  If she continues to have pain from the meniscus tear we may need to consider arthroscopic surgery.  60 cc of blood-tinged fluid was aspirated from the left knee joint today.  Follow-Up Instructions: Return in about 4 weeks (around 04/28/2018).   Orders:  No orders of the defined types were placed in this encounter.  No orders of the defined types were placed in this encounter.     Procedures: Large Joint Inj: R knee on 03/31/2018 1:30 PM Details: 22 G needle Medications: 2 mL bupivacaine 0.5 %; 2 mL lidocaine 1 %; 40 mg methylPREDNISolone acetate 40 MG/ML Aspirate: 60 mL blood-tinged Outcome: tolerated well, no immediate complications Patient was prepped and draped in the usual sterile fashion.       Clinical Data: No additional findings.   Subjective: Chief Complaint  Patient presents with  . Right Knee - Pain    [name] comes in today for follow-up of her MRI and Monovisc injection.  She states that she has a combination of constant achy pain as well as sharp stabbing pain on the medial side.   Review of Systems   Objective: Vital Signs: There were no vitals taken for this visit.  Physical Exam  Ortho Exam Left knee exam shows joint  effusion.  There is also subcutaneous swelling.  Otherwise exam is stable.  She does have medial joint line tenderness. Specialty Comments:  No specialty comments available.  Imaging: No results found.   PMFS History: Patient Active Problem List   Diagnosis Date Noted  . Acute medial meniscus tear of right knee 03/31/2018  . Acute pain of right knee 01/12/2018  . Hypercoagulation syndrome ([place]) 05/24/2014  . Hunter's glossitis 05/24/2014  . Sarcoidosis of lung ([place]) 05/24/2014  . Hypoxemia 05/24/2014  . Essential hypertension, benign 10/13/2013  . Nocturnal hypoxia 05/19/2013  . Obstructive sleep apnea (adult) (pediatric) 05/19/2013  . Obesity 05/19/2013  . OSA on CPAP   . Adenopathy 05/26/2012  . Cough 05/20/2012  . OSA (obstructive sleep apnea) 05/20/2012  . Pulmonary embolism ([place]) 04/29/2012  . DVT (deep venous thrombosis) ([place]) 04/29/2012   Past Medical History:  Diagnosis Date  . Anxiety    takes Lorazepam daily as needed  . Arthritis   . Cancer ([place])    thyroid  . Cataracts, bilateral    immature  . Chronic back pain    compressed vertebrae,takes Fosamax weekly  . Family history of adverse reaction to anesthesia    granddaughter gets very sick and mean  . History of blood transfusion    no abnormal reaction noted  .  History of bronchitis 05/2015  . History of colon polyps    benign  . Hyperlipidemia    takes Zetia daily  . Hypertension    takes Diltiazem and Avapro daily  . Hypothyroidism    takes Synthroid daily  . Ischemic colitis ([place])    hx of-many yrs ago  . Joint pain   . Joint swelling   . OSA on CPAP    05-08-12 AHi was 46, RDI  53. titrated to   9 cm water  with 3 cm flex.-nadir 75%  . PE (pulmonary embolism)    takes Coumadin daily  . Peripheral edema   . Pneumonia 1992   hx of  . Sarcoidosis   . Shortness of breath   . Urinary frequency   . Weakness    left leg.Numbness and tingling.Has sciatica  . Wheeze    occaionally. Not new      Family History  Problem Relation Age of Onset  . CVA Mother   . Heart attack Neg Hx     Past Surgical History:  Procedure Laterality Date  . ABDOMINAL HYSTERECTOMY    . APPENDECTOMY    . BREAST BIOPSY     biopsies on right x 2  . BREAST CYST ASPIRATION Left   . CHOLECYSTECTOMY N/A 11/29/2015   Procedure: LAPAROSCOPIC CHOLECYSTECTOMY;  Surgeon: [name], MD;  Location: [facility];  Service: General;  Laterality: N/A;  . COLONOSCOPY    . DILATION AND CURETTAGE OF UTERUS    . LIVER BIOPSY    . MELANOMA EXCISION     removed from left leg  . scalene surgery  1972   "trying to find out what was wrong with her lungs"  . THYROID SURGERY     Social History   Occupational History  . Occupation: Retired    Fish farm manager: RETIRED  Tobacco Use  . Smoking status: Never Smoker  . Smokeless tobacco: Never Used  Substance and Sexual Activity  . Alcohol use: No  . Drug use: No  . Sexual activity: Not on file

## 2018-03-31 NOTE — Addendum Note (Signed)
Addended by: Precious Bard on: 03/31/2018 02:12 PM   Modules accepted: Orders

## 2018-04-01 LAB — SYNOVIAL CELL COUNT + DIFF, W/ CRYSTALS
BASOPHILS, %: 0 %
Eosinophils-Synovial: 0 % (ref 0–2)
Lymphocytes-Synovial Fld: 48 % (ref 0–74)
Monocyte/Macrophage: 18 % (ref 0–69)
NEUTROPHIL, SYNOVIAL: 33 % — AB (ref 0–24)
Synoviocytes, %: 0 % (ref 0–15)
WBC, SYNOVIAL: 108 {cells}/uL (ref <150)

## 2018-04-01 LAB — TIQ-NTM

## 2018-04-01 NOTE — Progress Notes (Signed)
Just arthritis flare up

## 2018-04-12 ENCOUNTER — Ambulatory Visit
Admission: RE | Admit: 2018-04-12 | Discharge: 2018-04-12 | Disposition: A | Discharge status: Home / Self Care | Payer: Medicare HMO | Source: Ambulatory Visit | Attending: Family Medicine | Admitting: Family Medicine

## 2018-04-12 ENCOUNTER — Other Ambulatory Visit: Payer: Self-pay | Admitting: Family Medicine

## 2018-04-12 DIAGNOSIS — R109 Unspecified abdominal pain: Secondary | ICD-10-CM

## 2018-04-12 DIAGNOSIS — Z7901 Long term (current) use of anticoagulants: Secondary | ICD-10-CM | POA: Diagnosis not present

## 2018-04-12 DIAGNOSIS — R1084 Generalized abdominal pain: Secondary | ICD-10-CM | POA: Diagnosis not present

## 2018-04-12 DIAGNOSIS — I10 Essential (primary) hypertension: Secondary | ICD-10-CM | POA: Diagnosis not present

## 2018-04-12 DIAGNOSIS — Z6841 Body Mass Index (BMI) 40.0 and over, adult: Secondary | ICD-10-CM | POA: Diagnosis not present

## 2018-04-28 ENCOUNTER — Encounter (INDEPENDENT_AMBULATORY_CARE_PROVIDER_SITE_OTHER): Payer: Self-pay | Admitting: Orthopaedic Surgery

## 2018-04-28 ENCOUNTER — Ambulatory Visit (INDEPENDENT_AMBULATORY_CARE_PROVIDER_SITE_OTHER): Payer: Medicare HMO | Admitting: Orthopaedic Surgery

## 2018-04-28 DIAGNOSIS — S83241A Other tear of medial meniscus, current injury, right knee, initial encounter: Secondary | ICD-10-CM

## 2018-04-28 NOTE — Progress Notes (Signed)
Office Visit Note   Patient: [name]           Date of Birth: Jan 15, 1941           MRN: [id number] Visit Date: 04/28/2018              Requested by: Leanna Battles, MD [place], [place] [address] PCP: Leanna Battles, MD   Assessment & Plan: Visit Diagnoses:  1. Acute medial meniscus tear of right knee, initial encounter     Plan: Patient has done well from the knee aspiration injection.  At this point she can increase activity as tolerated.  She knows to take it easy in general.  We discussed that if the symptoms return that we may need to consider arthroscopic debridement of the meniscus tear.  For now she can follow-up as needed.  Follow-Up Instructions: Return if symptoms worsen or fail to improve.   Orders:  No orders of the defined types were placed in this encounter.  No orders of the defined types were placed in this encounter.     Procedures: No procedures performed   Clinical Data: No additional findings.   Subjective: Chief Complaint  Patient presents with  . Left Knee - Follow-up    [name] follows up today for right knee pain.  We performed an aspiration and injection a month ago.  Overall she is doing well and she no longer has mechanical symptoms or effusion.   Review of Systems  Constitutional: Negative.   HENT: Negative.   Eyes: Negative.   Respiratory: Negative.   Cardiovascular: Negative.   Endocrine: Negative.   Musculoskeletal: Negative.   Neurological: Negative.   Hematological: Negative.   Psychiatric/Behavioral: Negative.   All other systems reviewed and are negative.    Objective: Vital Signs: There were no vitals taken for this visit.  Physical Exam  Constitutional: She is oriented to person, place, and time. She appears well-developed and well-nourished.  Pulmonary/Chest: Effort normal.  Neurological: She is alert and oriented to person, place, and time.  Skin: Skin is warm. Capillary refill  takes less than 2 seconds.  Psychiatric: She has a normal mood and affect. Her behavior is normal. Judgment and thought content normal.  Nursing note and vitals reviewed.   Ortho Exam Right knee exam shows no joint effusion.  She has really good range of motion. Specialty Comments:  No specialty comments available.  Imaging: No results found.   PMFS History: Patient Active Problem List   Diagnosis Date Noted  . Acute medial meniscus tear of right knee 03/31/2018  . Acute pain of right knee 01/12/2018  . Hypercoagulation syndrome ([place]) 05/24/2014  . Hunter's glossitis 05/24/2014  . Sarcoidosis of lung ([place]) 05/24/2014  . Hypoxemia 05/24/2014  . Essential hypertension, benign 10/13/2013  . Nocturnal hypoxia 05/19/2013  . Obstructive sleep apnea (adult) (pediatric) 05/19/2013  . Obesity 05/19/2013  . OSA on CPAP   . Adenopathy 05/26/2012  . Cough 05/20/2012  . OSA (obstructive sleep apnea) 05/20/2012  . Pulmonary embolism ([place]) 04/29/2012  . DVT (deep venous thrombosis) ([place]) 04/29/2012   Past Medical History:  Diagnosis Date  . Anxiety    takes Lorazepam daily as needed  . Arthritis   . Cancer ([place])    thyroid  . Cataracts, bilateral    immature  . Chronic back pain    compressed vertebrae,takes Fosamax weekly  . Family history of adverse reaction to anesthesia    granddaughter gets very sick and mean  .  History of blood transfusion    no abnormal reaction noted  . History of bronchitis 05/2015  . History of colon polyps    benign  . Hyperlipidemia    takes Zetia daily  . Hypertension    takes Diltiazem and Avapro daily  . Hypothyroidism    takes Synthroid daily  . Ischemic colitis ([place])    hx of-many yrs ago  . Joint pain   . Joint swelling   . OSA on CPAP    05-08-12 AHi was 46, RDI  53. titrated to   9 cm water  with 3 cm flex.-nadir 75%  . PE (pulmonary embolism)    takes Coumadin daily  . Peripheral edema   . Pneumonia 1992   hx of  .  Sarcoidosis   . Shortness of breath   . Urinary frequency   . Weakness    left leg.Numbness and tingling.Has sciatica  . Wheeze    occaionally. Not new    Family History  Problem Relation Age of Onset  . CVA Mother   . Heart attack Neg Hx     Past Surgical History:  Procedure Laterality Date  . ABDOMINAL HYSTERECTOMY    . APPENDECTOMY    . BREAST BIOPSY     biopsies on right x 2  . BREAST CYST ASPIRATION Left   . CHOLECYSTECTOMY N/A 11/29/2015   Procedure: LAPAROSCOPIC CHOLECYSTECTOMY;  Surgeon: [name], MD;  Location: [facility];  Service: General;  Laterality: N/A;  . COLONOSCOPY    . DILATION AND CURETTAGE OF UTERUS    . LIVER BIOPSY    . MELANOMA EXCISION     removed from left leg  . scalene surgery  1972   "trying to find out what was wrong with her lungs"  . THYROID SURGERY     Social History   Occupational History  . Occupation: Retired    Fish farm manager: RETIRED  Tobacco Use  . Smoking status: Never Smoker  . Smokeless tobacco: Never Used  Substance and Sexual Activity  . Alcohol use: No  . Drug use: No  . Sexual activity: Not on file

## 2018-05-13 DIAGNOSIS — I8290 Acute embolism and thrombosis of unspecified vein: Secondary | ICD-10-CM | POA: Diagnosis not present

## 2018-05-13 DIAGNOSIS — Z7901 Long term (current) use of anticoagulants: Secondary | ICD-10-CM | POA: Diagnosis not present

## 2018-05-20 ENCOUNTER — Other Ambulatory Visit: Payer: Self-pay | Admitting: Internal Medicine

## 2018-05-20 DIAGNOSIS — I10 Essential (primary) hypertension: Secondary | ICD-10-CM | POA: Diagnosis not present

## 2018-05-20 DIAGNOSIS — Z1231 Encounter for screening mammogram for malignant neoplasm of breast: Secondary | ICD-10-CM

## 2018-05-20 DIAGNOSIS — Z6841 Body Mass Index (BMI) 40.0 and over, adult: Secondary | ICD-10-CM | POA: Diagnosis not present

## 2018-05-20 DIAGNOSIS — G4733 Obstructive sleep apnea (adult) (pediatric): Secondary | ICD-10-CM | POA: Diagnosis not present

## 2018-05-20 DIAGNOSIS — M25561 Pain in right knee: Secondary | ICD-10-CM | POA: Diagnosis not present

## 2018-05-31 DIAGNOSIS — R05 Cough: Secondary | ICD-10-CM | POA: Diagnosis not present

## 2018-05-31 DIAGNOSIS — I10 Essential (primary) hypertension: Secondary | ICD-10-CM | POA: Diagnosis not present

## 2018-05-31 DIAGNOSIS — Z7901 Long term (current) use of anticoagulants: Secondary | ICD-10-CM | POA: Diagnosis not present

## 2018-05-31 DIAGNOSIS — J069 Acute upper respiratory infection, unspecified: Secondary | ICD-10-CM | POA: Diagnosis not present

## 2018-05-31 DIAGNOSIS — Z6841 Body Mass Index (BMI) 40.0 and over, adult: Secondary | ICD-10-CM | POA: Diagnosis not present

## 2018-06-17 DIAGNOSIS — I8290 Acute embolism and thrombosis of unspecified vein: Secondary | ICD-10-CM | POA: Diagnosis not present

## 2018-06-17 DIAGNOSIS — Z7901 Long term (current) use of anticoagulants: Secondary | ICD-10-CM | POA: Diagnosis not present

## 2018-06-29 ENCOUNTER — Ambulatory Visit: Payer: Medicare HMO | Admitting: Adult Health

## 2018-06-30 ENCOUNTER — Ambulatory Visit: Payer: Medicare HMO | Admitting: Adult Health

## 2018-06-30 ENCOUNTER — Telehealth: Payer: Self-pay | Admitting: *Deleted

## 2018-06-30 ENCOUNTER — Encounter: Payer: Self-pay | Admitting: Adult Health

## 2018-06-30 ENCOUNTER — Ambulatory Visit
Admission: RE | Admit: 2018-06-30 | Discharge: 2018-06-30 | Disposition: A | Discharge status: Home / Self Care | Payer: Medicare HMO | Source: Ambulatory Visit | Attending: Internal Medicine | Admitting: Internal Medicine

## 2018-06-30 VITALS — BP 154/80 | HR 73 | Ht 64.0 in | Wt 242.0 lb

## 2018-06-30 DIAGNOSIS — G4733 Obstructive sleep apnea (adult) (pediatric): Secondary | ICD-10-CM | POA: Diagnosis not present

## 2018-06-30 DIAGNOSIS — Z9989 Dependence on other enabling machines and devices: Secondary | ICD-10-CM

## 2018-06-30 DIAGNOSIS — Z1231 Encounter for screening mammogram for malignant neoplasm of breast: Secondary | ICD-10-CM | POA: Diagnosis not present

## 2018-06-30 NOTE — Patient Instructions (Signed)

## 2018-06-30 NOTE — Telephone Encounter (Signed)
Spoke with patient and advised her to call [employer] and provide CPAP information so they can attach her to [facility] for compliance reports. I gave her [employer]'s number, Lovena Le and [facility] names as contacts.  Patient verbalized understanding, appreciation.

## 2018-06-30 NOTE — Progress Notes (Addendum)
Sent community message to [employer] re: [name] is seeing Falesha Schommer, 1940-12-10 now. The patient is [employer], wireless but download is old on resmed. Please attach her.  Received reply from [employer]: We will take care of this. Received 2 nd message from [employer]:  So i did some checking.. This patient was a TOC to [place].. She did not bring her machine in for [place] to get her SN and device number off the machine she is using so we can't tag her. If you can get that information for [place] we can get her tagged for y'all.  I will call patient and advise her to call [employer] and provide information.

## 2018-06-30 NOTE — Progress Notes (Signed)
PATIENT: Kaylinn Dedic Neiswonger DOB: 12/29/1940  REASON FOR VISIT: follow up HISTORY FROM: patient  HISTORY OF PRESENT ILLNESS: Today 06/30/18 Ms. Mccaig is a 78 year old female with a history of obstructive sleep apnea on CPAP.  She returns today for follow-up.  We were unable to obtain a wireless download.  The patient states that she does not have a chip in her machine.  We have reached out to her DME company for a download.  The patient states that she uses the machine every night.  She denies any significant issues with her CPAP.  She continues to notice the benefit.  She states on occasion she will have a leak but she does readjust her mask.  She returns today for evaluation.  HISTORY (Copied from Dr.Dohmeier's note) Mrs. Halbert, 61,  is seen here today for her yearly scheduled revisit.  On 24 June 2017 her compliance for CPAP is excellent at 100% and average use of time of 8 hours and 57 minutes at night, CPAP is set at 10.6 cm water pressure with 3 cm EPR. This download contains data from June 07, 2017, in the meantime she was switched to a new machine since her old one was 80 years old the heating element  Broke.  She saw Linzie Collin at [facility], who swapped the machine recently for and autotitrator but she states that the pressure window is far and wide between 5 and 20 cm water.  Given that she has done very well on 11 cmH2O I would like for her machine to be set to that pressure, she can remain on 3 cm expiratory pressure relief, she likes her current interface-  a nasal pillow. She does not have major air leaks according to the December download. Mrs. Kalka also uses 3 L of oxygen bled into the machine at night, she reports she has an oxygen concentrator at home.  I am not sure if oxygen and CPAP are both handled by [facility].  Patient stated that during periods of power outage with [place] and with snowstorms , she had been sleeping in a recliner as she  cannot go to sleep in a bed without CPAP and oxygen.   REVIEW OF SYSTEMS: Out of a complete 14 system review of symptoms, the patient complains only of the following symptoms, and all other reviewed systems are negative.  Urinary urgency, apnea, cough, wheezing, ear pain  ALLERGIES: Allergies  Allergen Reactions  . Codeine Other (See Comments)    Causes hyperactivity  . Erythromycin Itching and Other (See Comments)    Also with jitters  . Tetracyclines & Related Itching    Also with jitters  . Statins Other (See Comments)    Leg muscle weakness  . Latex Rash    HOME MEDICATIONS: Outpatient Medications Prior to Visit  Medication Sig Dispense Refill  . alendronate (FOSAMAX) 70 MG tablet Take 70 mg by mouth once a week.     Marland Kitchen amLODipine (NORVASC) 5 MG tablet 5 mg daily.    . AVAPRO 300 MG tablet TAKE 1/2 (ONE-HALF) TABLET BY MOUTH ONCE DAILY 45 tablet 2  . Carboxymethylcellulose Sodium (REFRESH LIQUIGEL OP) Place 1 drop into both eyes at bedtime.    Marland Kitchen diltiazem (TIAZAC) 360 MG 24 hr capsule Take 1 capsule by mouth daily.    . fluticasone (FLONASE) 50 MCG/ACT nasal spray Place 1 spray into the nose daily as needed for allergies. For allergies.    Marland Kitchen levothyroxine (SYNTHROID, LEVOTHROID) 100 MCG tablet Take  100 mcg by mouth daily before breakfast.   1  . LORazepam (ATIVAN) 1 MG tablet Take 1 mg by mouth as needed for anxiety.     . Lutein-Zeaxanthin 25-5 MG CAPS Take 1 tablet by mouth daily. EYE VITAMIN    . nystatin cream (MYCOSTATIN) Apply 1 application topically daily.   1  . warfarin (COUMADIN) 5 MG tablet Take 5 mg by mouth every Wednesday. Take on Wednesday.  2  . warfarin (COUMADIN) 7.5 MG tablet Take 7.5 mg by mouth See admin instructions. Nancy Fetter Mon Tues Thurs Fri Sat    . baclofen (LIORESAL) 10 MG tablet Take 1 tablet (10 mg total) by mouth 3 (three) times daily as needed for muscle spasms. (Patient not taking: Reported on 06/30/2018) 30 each 2  . oseltamivir (TAMIFLU) 75 MG  capsule Take 75 mg by mouth 2 (two) times daily.     No facility-administered medications prior to visit.     PAST MEDICAL HISTORY: Past Medical History:  Diagnosis Date  . Anxiety    takes Lorazepam daily as needed  . Arthritis   . Cancer ([place])    thyroid  . Cataracts, bilateral    immature  . Chronic back pain    compressed vertebrae,takes Fosamax weekly  . Family history of adverse reaction to anesthesia    granddaughter gets very sick and mean  . History of blood transfusion    no abnormal reaction noted  . History of bronchitis 05/2015  . History of colon polyps    benign  . Hyperlipidemia    takes Zetia daily  . Hypertension    takes Diltiazem and Avapro daily  . Hypothyroidism    takes Synthroid daily  . Ischemic colitis ([place])    hx of-many yrs ago  . Joint pain   . Joint swelling   . OSA on CPAP    05-08-12 AHi was 46, RDI  53. titrated to   9 cm water  with 3 cm flex.-nadir 75%  . PE (pulmonary embolism)    takes Coumadin daily  . Peripheral edema   . Pneumonia 1992   hx of  . Sarcoidosis   . Shortness of breath   . Urinary frequency   . Weakness    left leg.Numbness and tingling.Has sciatica  . Wheeze    occaionally. Not new    PAST SURGICAL HISTORY: Past Surgical History:  Procedure Laterality Date  . ABDOMINAL HYSTERECTOMY    . APPENDECTOMY    . BREAST BIOPSY     biopsies on right x 2  . BREAST CYST ASPIRATION Left   . CHOLECYSTECTOMY N/A 11/29/2015   Procedure: LAPAROSCOPIC CHOLECYSTECTOMY;  Surgeon: [name], MD;  Location: [facility];  Service: General;  Laterality: N/A;  . COLONOSCOPY    . DILATION AND CURETTAGE OF UTERUS    . LIVER BIOPSY    . MELANOMA EXCISION     removed from left leg  . scalene surgery  1972   "trying to find out what was wrong with her lungs"  . THYROID SURGERY      FAMILY HISTORY: Family History  Problem Relation Age of Onset  . CVA Mother   . Heart attack Neg Hx     SOCIAL HISTORY: Social History    Socioeconomic History  . Marital status: Married    Spouse name: Not on file  . Number of children: Not on file  . Years of education: Not on file  . Highest education level: Not on file  Occupational History  . Occupation: Retired    Fish farm manager: RETIRED  Social Needs  . Financial resource strain: Not on file  . Food insecurity:    Worry: Not on file    Inability: Not on file  . Transportation needs:    Medical: Not on file    Non-medical: Not on file  Tobacco Use  . Smoking status: Never Smoker  . Smokeless tobacco: Never Used  Substance and Sexual Activity  . Alcohol use: No  . Drug use: No  . Sexual activity: Not on file  Lifestyle  . Physical activity:    Days per week: Not on file    Minutes per session: Not on file  . Stress: Not on file  Relationships  . Social connections:    Talks on phone: Not on file    Gets together: Not on file    Attends religious service: Not on file    Active member of club or organization: Not on file    Attends meetings of clubs or organizations: Not on file    Relationship status: Not on file  . Intimate partner violence:    Fear of current or ex partner: Not on file    Emotionally abused: Not on file    Physically abused: Not on file    Forced sexual activity: Not on file  Other Topics Concern  . Not on file  Social History Narrative  . Not on file      PHYSICAL EXAM  Vitals:   06/30/18 0711  BP: (!) 154/80  Pulse: 73  Weight: 242 lb (109.8 kg)  Height: 5\' 4"  (1.626 m)   Body mass index is 41.54 kg/m.  Generalized: Well developed, in no acute distress   Neurological examination  Mentation: Alert oriented to time, place, history taking. Follows all commands speech and language fluent Cranial nerve II-XII:  Extraocular movements were full, visual field were full on confrontational test. Facial sensation and strength were normal. Uvula tongue midline. Head turning and shoulder shrug  were normal and symmetric.  Neck  circumference 16-1/2 inches, Mallampati 1+ Motor: The motor testing reveals 5 over 5 strength of all 4 extremities. Good symmetric motor tone is noted throughout.  Sensory: Sensory testing is intact to soft touch on all 4 extremities. No evidence of extinction is noted.  Coordination: Cerebellar testing reveals good finger-nose-finger and heel-to-shin bilaterally.  Gait and station: Gait is normal.    DIAGNOSTIC DATA (LABS, IMAGING, TESTING) - I reviewed patient records, labs, notes, testing and imaging myself where available.  Lab Results  Component Value Date   WBC 7.9 11/28/2015   HGB 14.4 11/28/2015   HCT 44.2 11/28/2015   MCV 88.6 11/28/2015   PLT 239 11/28/2015      Component Value Date/Time   NA 141 11/28/2015 0844   NA 140 11/29/2014 0839   K 3.9 11/28/2015 0844   K 4.4 11/29/2014 0839   CL 108 11/28/2015 0844   CL 104 04/29/2012 1145   CO2 26 11/28/2015 0844   CO2 28 11/29/2014 0839   GLUCOSE 97 11/28/2015 0844   GLUCOSE 121 11/29/2014 0839   GLUCOSE 93 04/29/2012 1145   BUN 12 11/28/2015 0844   BUN 13.5 11/29/2014 0839   CREATININE 0.79 11/28/2015 0844   CREATININE 0.8 11/29/2014 0839   CALCIUM 9.5 11/28/2015 0844   CALCIUM 9.1 11/29/2014 0839   PROT 7.4 11/29/2014 0839   ALBUMIN 3.4 (L) 11/29/2014 0839   AST 19 11/29/2014 0839   ALT  24 11/29/2014 0839   ALKPHOS 84 11/29/2014 0839   BILITOT 0.42 11/29/2014 0839   GFRNONAA >60 11/28/2015 0844   GFRAA >60 11/28/2015 0844   Lab Results  Component Value Date   TSH 2.500 05/24/2014      ASSESSMENT AND PLAN 78 y.o. year old female  has a past medical history of Anxiety, Arthritis, Cancer ([place]), Cataracts, bilateral, Chronic back pain, Family history of adverse reaction to anesthesia, History of blood transfusion, History of bronchitis (05/2015), History of colon polyps, Hyperlipidemia, Hypertension, Hypothyroidism, Ischemic colitis ([place]), Joint pain, Joint swelling, OSA on CPAP, PE (pulmonary embolism),  Peripheral edema, Pneumonia (1992), Sarcoidosis, Shortness of breath, Urinary frequency, Weakness, and Wheeze. here with:  1.  Obstructive sleep apnea on CPAP  We have reached out to her DME company to obtain a download.  Once we have the download we will call patient and review the data with her.  She is encouraged to continue using the CPAP nightly and greater than 4 hours each night.  If her symptoms worsen or she develops new symptoms she should let us know.  She will follow-up in 6 months or sooner if needed.   I spent 15 minutes with the patient. 50% of this time was spent plan of care   Ward Givens, MSN, NP-C 06/30/2018, 7:38 AM [facility] [address], [place], [place] [address] [phone]

## 2018-07-29 DIAGNOSIS — I8290 Acute embolism and thrombosis of unspecified vein: Secondary | ICD-10-CM | POA: Diagnosis not present

## 2018-07-29 DIAGNOSIS — Z7901 Long term (current) use of anticoagulants: Secondary | ICD-10-CM | POA: Diagnosis not present

## 2018-08-04 DIAGNOSIS — L820 Inflamed seborrheic keratosis: Secondary | ICD-10-CM | POA: Diagnosis not present

## 2018-08-04 DIAGNOSIS — L570 Actinic keratosis: Secondary | ICD-10-CM | POA: Diagnosis not present

## 2018-08-26 ENCOUNTER — Telehealth: Payer: Self-pay

## 2018-08-26 NOTE — Telephone Encounter (Addendum)
   Cardiac Questionnaire:    Since your last visit or hospitalization:    1. Have you been having new or worsening chest pain? NO   2. Have you been having new or worsening shortness of breath? NO 3. Have you been having new or worsening leg swelling, wt gain, or increase in abdominal girth (pants fitting more tightly)? NO   4. Have you had any passing out spells? NO    *A YES to any of these questions would result in the appointment being kept. *If all the answers to these questions are NO, we should indicate that given the current situation regarding the worldwide coronarvirus pandemic, at the recommendation of the CDC, we are looking to limit gatherings in our waiting area, and thus will reschedule their appointment beyond four weeks from today.   _____________   [id number] Pre-Screening Questions:  . Do you currently have a fever? NO (yes = cancel and refer to pcp for e-visit) . Have you recently travelled on a cruise, internationally, or to [place], [place], [place], [place], [place], or [place], [place] [employer]) ? NO (yes = cancel, stay home, monitor symptoms, and contact pcp or initiate e-visit if symptoms develop) . Have you been in contact with someone that is currently pending confirmation of Covid19 testing or has been confirmed to have the [place] virus?  NO (yes = cancel, stay home, away from tested individual, monitor symptoms, and contact pcp or initiate e-visit if symptoms develop) . Are you currently experiencing fatigue or cough? NO (yes = pt should be prepared to have a mask placed at the time of their visit).      Appointment Cancelled due to Coronavirus:  Called patient in regards to f/u appointment with Dr. Irish Lack on 09/02/2018.   Patient denies having any chest pain, SOB, cough, fever, or any other Sx.   Patient was okay with cancelling appointment and has been made aware that they will be contacted in the near future to reschedule.   Refills have been sent in if needed.   Patient  understands to let us know if they develop any Sx before then.

## 2018-08-30 ENCOUNTER — Ambulatory Visit: Payer: Medicare HMO | Admitting: Interventional Cardiology

## 2018-09-02 ENCOUNTER — Ambulatory Visit: Payer: Medicare HMO | Admitting: Interventional Cardiology

## 2018-09-08 DIAGNOSIS — Z6841 Body Mass Index (BMI) 40.0 and over, adult: Secondary | ICD-10-CM | POA: Diagnosis not present

## 2018-09-08 DIAGNOSIS — I8290 Acute embolism and thrombosis of unspecified vein: Secondary | ICD-10-CM | POA: Diagnosis not present

## 2018-09-08 DIAGNOSIS — Z7901 Long term (current) use of anticoagulants: Secondary | ICD-10-CM | POA: Diagnosis not present

## 2018-09-13 NOTE — Telephone Encounter (Signed)
Virtual Visit Pre-Appointment Phone Call   TELEPHONE CALL NOTE  [name] has been deemed a candidate for a follow-up tele-health visit to limit community exposure during the Covid-19 pandemic. I spoke with the patient via phone to ensure availability of phone/video source, confirm preferred email & phone number, and discuss instructions and expectations.  I reminded [name] to be prepared with any vital sign and/or heart rhythm information that could potentially be obtained via home monitoring, at the time of her visit. I reminded [name] to expect a phone call at the time of her visit if her visit.  Did the patient verbally acknowledge consent to treatment? YES  Patient only has flip phone and cannot do VIDEO Visit Patient agrees to TELEPHONE Visit with Dr. Irish Lack on 4/7  Cleon Gustin, [place] 09/13/2018 12:33 PM   DOWNLOADING [place], go to [employer] and type in WebEx in the search bar. [place] [employer], the blue/green circle. The app is free but as with any other app downloads, their phone may require them to verify saved payment information or Apple password. The patient does NOT have to create an account.  - If Android, ask patient to go to [employer] and type in WebEx in the search bar. [place] [employer], the blue/green circle. The app is free but as with any other app downloads, their phone may require them to verify saved payment information or Android password. The patient does NOT have to create an account.   CONSENT FOR TELE-HEALTH VISIT - PLEASE REVIEW  I hereby voluntarily request, consent and authorize [facility] and its employed or contracted physicians, physician assistants, nurse practitioners or other licensed health care professionals (the Practitioner), to provide me with telemedicine health care services (the "Services") as deemed necessary by the  treating Practitioner. I acknowledge and consent to receive the Services by the Practitioner via telemedicine. I understand that the telemedicine visit will involve communicating with the Practitioner through live audiovisual communication technology and the disclosure of certain medical information by electronic transmission. I acknowledge that I have been given the opportunity to request an in-person assessment or other available alternative prior to the telemedicine visit and am voluntarily participating in the telemedicine visit.  I understand that I have the right to withhold or withdraw my consent to the use of telemedicine in the course of my care at any time, without affecting my right to future care or treatment, and that the Practitioner or I may terminate the telemedicine visit at any time. I understand that I have the right to inspect all information obtained and/or recorded in the course of the telemedicine visit and may receive copies of available information for a reasonable fee.  I understand that some of the potential risks of receiving the Services via telemedicine include:  Marland Kitchen Delay or interruption in medical evaluation due to technological equipment failure or disruption; . Information transmitted may not be sufficient (e.g. poor resolution of images) to allow for appropriate medical decision making by the Practitioner; and/or  . In rare instances, security protocols could fail, causing a breach of personal health information.  Furthermore, I acknowledge that it is my responsibility to provide information about my medical history, conditions and care that is complete and accurate to the best of my ability. I acknowledge that Practitioner's advice, recommendations, and/or decision may be based on factors not within their control, such as incomplete  or inaccurate data provided by me or distortions of diagnostic images or specimens that may result from electronic transmissions. I understand  that the practice of medicine is not an exact science and that Practitioner makes no warranties or guarantees regarding treatment outcomes. I acknowledge that I will receive a copy of this consent concurrently upon execution via email to the email address I last provided but may also request a printed copy by calling the office of [facility].    I understand that my insurance will be billed for this visit.   I have read or had this consent read to me. . I understand the contents of this consent, which adequately explains the benefits and risks of the Services being provided via telemedicine.  . I have been provided ample opportunity to ask questions regarding this consent and the Services and have had my questions answered to my satisfaction. . I give my informed consent for the services to be provided through the use of telemedicine in my medical care  By participating in this telemedicine visit I agree to the above.

## 2018-09-14 ENCOUNTER — Telehealth (INDEPENDENT_AMBULATORY_CARE_PROVIDER_SITE_OTHER): Payer: Medicare HMO | Admitting: Interventional Cardiology

## 2018-09-14 ENCOUNTER — Encounter: Payer: Self-pay | Admitting: Interventional Cardiology

## 2018-09-14 ENCOUNTER — Other Ambulatory Visit: Payer: Self-pay

## 2018-09-14 DIAGNOSIS — E669 Obesity, unspecified: Secondary | ICD-10-CM

## 2018-09-14 DIAGNOSIS — Z9981 Dependence on supplemental oxygen: Secondary | ICD-10-CM

## 2018-09-14 DIAGNOSIS — I825Y2 Chronic embolism and thrombosis of unspecified deep veins of left proximal lower extremity: Secondary | ICD-10-CM | POA: Diagnosis not present

## 2018-09-14 DIAGNOSIS — Z7901 Long term (current) use of anticoagulants: Secondary | ICD-10-CM | POA: Diagnosis not present

## 2018-09-14 DIAGNOSIS — R002 Palpitations: Secondary | ICD-10-CM

## 2018-09-14 DIAGNOSIS — E785 Hyperlipidemia, unspecified: Secondary | ICD-10-CM | POA: Diagnosis not present

## 2018-09-14 DIAGNOSIS — I10 Essential (primary) hypertension: Secondary | ICD-10-CM | POA: Diagnosis not present

## 2018-09-14 MED ORDER — IRBESARTAN-HYDROCHLOROTHIAZIDE 300-12.5 MG PO TABS: 0.5000 | ORAL_TABLET | Freq: Every day | ORAL | 3 refills | Status: DC

## 2018-09-14 NOTE — Progress Notes (Signed)
Virtual Visit via Telephone Note   This visit type was conducted due to national recommendations for restrictions regarding the COVID-19 Pandemic (e.g. social distancing) in an effort to limit this patient's exposure and mitigate transmission in our community.  Due to her co-morbid illnesses, this patient is at least at moderate risk for complications without adequate follow up.  This format is felt to be most appropriate for this patient at this time.  The patient did not have access to video technology/had technical difficulties with video requiring transitioning to audio format only (telephone).  All issues noted in this document were discussed and addressed.  No physical exam could be performed with this format.  Please refer to the patient's chart for her  consent to telehealth for [facility].   Evaluation Performed:  Follow-up visit  Date:  09/14/2018   ID:  [name], DOB December 31, 1940, MRN [id number]  Patient Location: Home  Provider Location: Home  PCP:  Leanna Battles, MD  Cardiologist:  Larae Grooms, MD  Electrophysiologist:  None   Chief Complaint:  Anticoagulated  History of Present Illness:    [name] is a 78 y.o. female who presents via audio/video conferencing for a telehealth visit today.   She has a h/o of DVT/PE. SHe was treated with Coumadin for 6 months. No reason found for DVTs. She had a stress test in 4/09 which was negative for ischemia. She exercised for 6:15 seconds. She had IBS but in 4/10, had persistent pain and had blood in her stool while off coumadin, she was diagnosed with ischemic colitis. Later, she states she was diagnosed with polycythemia, which was being managed by Dr. Jamse Arn. She was also placed on Xarelto, after a CT scan but then switched back to Coumadin.;.  BP has been well controlled. See below. Readings typically higher in the MDs office.   SHe felt poorly off of brand name Avapro. Has tried losartan and  irbesartan. BP was not well controlled off of Avapro. Cough resolved.  She had a skin cancer removed. She required some ABx at that time.   Cholecystectomy done in 6/17. Did well.   She tries amlodipine but had feet swelling.  She has tolerated avapro.    Denies : Chest pain. Dizziness. Leg edema. Nitroglycerin use. Orthopnea. Paroxysmal nocturnal dyspnea. Shortness of breath. Syncope.   She has had some intermittent palpitations, worse when she took cough medicine.  It stopped after 7 days.  No sx in the last week.  Sx improved with decrease caffeine.  She feels stress with the virus news.  Oxygen level has been stable.  INR was stable.     The patient does not have symptoms concerning for COVID-19 infection (fever, chills, cough, or new shortness of breath).    Past Medical History:  Diagnosis Date   Anxiety    takes Lorazepam daily as needed   Arthritis    Cancer ([place])    thyroid   Cataracts, bilateral    immature   Chronic back pain    compressed vertebrae,takes Fosamax weekly   Family history of adverse reaction to anesthesia    granddaughter gets very sick and mean   History of blood transfusion    no abnormal reaction noted   History of bronchitis 05/2015   History of colon polyps    benign   Hyperlipidemia    takes Zetia daily   Hypertension    takes Diltiazem and Avapro daily   Hypothyroidism    takes  Synthroid daily   Ischemic colitis ([place])    hx of-many yrs ago   Joint pain    Joint swelling    OSA on CPAP    05-08-12 AHi was 46, RDI  53. titrated to   9 cm water  with 3 cm flex.-nadir 75%   PE (pulmonary embolism)    takes Coumadin daily   Peripheral edema    Pneumonia 1992   hx of   Sarcoidosis    Shortness of breath    Urinary frequency    Weakness    left leg.Numbness and tingling.Has sciatica   Wheeze    occaionally. Not new   Past Surgical History:  Procedure Laterality Date   ABDOMINAL HYSTERECTOMY      APPENDECTOMY     BREAST BIOPSY     biopsies on right x 2   BREAST CYST ASPIRATION Left    CHOLECYSTECTOMY N/A 11/29/2015   Procedure: LAPAROSCOPIC CHOLECYSTECTOMY;  Surgeon: [name], MD;  Location: [facility];  Service: General;  Laterality: N/A;   COLONOSCOPY     DILATION AND CURETTAGE OF UTERUS     LIVER BIOPSY     MELANOMA EXCISION     removed from left leg   scalene surgery  1972   "trying to find out what was wrong with her lungs"   THYROID SURGERY       Current Meds  Medication Sig   alendronate (FOSAMAX) 70 MG tablet Take 70 mg by mouth once a week.    AVAPRO 300 MG tablet TAKE 1/2 (ONE-HALF) TABLET BY MOUTH ONCE DAILY   Carboxymethylcellulose Sodium (REFRESH LIQUIGEL OP) Place 1 drop into both eyes at bedtime.   diltiazem (TIAZAC) 360 MG 24 hr capsule Take 1 capsule by mouth daily.   fluticasone (FLONASE) 50 MCG/ACT nasal spray Place 1 spray into the nose daily as needed for allergies. For allergies.   levothyroxine (SYNTHROID, LEVOTHROID) 100 MCG tablet Take 100 mcg by mouth daily before breakfast.    LORazepam (ATIVAN) 1 MG tablet Take 1 mg by mouth as needed for anxiety.    Lutein-Zeaxanthin 25-5 MG CAPS Take 1 tablet by mouth daily. EYE VITAMIN   warfarin (COUMADIN) 5 MG tablet Take 5 mg by mouth every Wednesday. Take on Wednesday.   warfarin (COUMADIN) 7.5 MG tablet Take 7.5 mg by mouth See admin instructions. Sun Mon Tues Thurs Fri Sat     Allergies:   Codeine; Erythromycin; Tetracyclines & related; Statins; and Latex   Social History   Tobacco Use   Smoking status: Never Smoker   Smokeless tobacco: Never Used  Substance Use Topics   Alcohol use: No   Drug use: No     Family Hx: The patient's family history includes CVA in her mother. There is no history of Heart attack.  ROS:   Please see the history of present illness.    palpitations All other systems reviewed and are negative.   Prior CV studies:   The following studies were  reviewed today:  LDL 168 and TG 210 at last check  Labs/Other Tests and Data Reviewed:    EKG:  NSR, no ST changes in 2/19  Recent Labs: No results found for requested labs within last 8760 hours.   Recent Lipid Panel No results found for: CHOL, TRIG, HDL, CHOLHDL, LDLCALC, LDLDIRECT  Wt Readings from Last 3 Encounters:  09/14/18 235 lb (106.6 kg)  06/30/18 242 lb (109.8 kg)  07/22/17 231 lb 12.8 oz (105.1 kg)  Objective:    Vital Signs:  BP 126/64    Pulse 75    Ht 5\' 4"  (1.626 m)    Wt 235 lb (106.6 kg)    SpO2 94%    BMI 40.34 kg/m    Well nourished, well developed female in no acute distress. No apparent shortness of breath  ASSESSMENT & PLAN:    1. HTN: Low BPs so will decrease her irbesartan/HCTZ 300/12.5 to half tab 2. Hyperlipidemia: Statin intolerant.  LDL above 160.  3. Obesity: Diet control.  KNee pain has limited walking.  TOrn cartilage.  4. Anticoagulated: No bleeding issues.  Protected even if her palpitations wer representing AFib. 5. Palpitations: resolved.  WOuld not plan for monitor given the virus.  Sx resolved.   6. Wearing oxygen at night  COVID-19 Education: The signs and symptoms of COVID-19 were discussed with the patient and how to seek care for testing (follow up with PCP or arrange E-visit).  The importance of social distancing was discussed today.  Time:   Today, I have spent 20  minutes with the patient with telehealth technology discussing the above problems.     Medication Adjustments/Labs and Tests Ordered: Current medicines are reviewed at length with the patient today.  Concerns regarding medicines are outlined above.  Tests Ordered: No orders of the defined types were placed in this encounter.  Medication Changes: No orders of the defined types were placed in this encounter.   Disposition:  Follow up in 1 year(s)  Signed, Larae Grooms, MD  09/14/2018 12:45 PM    [place]

## 2018-09-14 NOTE — Patient Instructions (Signed)
Medication Instructions:  Your physician has recommended you make the following change in your medication:   DECREASE: irbesartan-hydrochlorothiazide 300-12.5 mg tablet: Take 1/2 tablet by mouth once a day  MONITOR YOUR BLOOD PRESSURE AND LET us KNOW IF IT IS CONSISTENTLY GREATER THAN 140/90  Lab work: None Ordered  If you have labs (blood work) drawn today and your tests are completely normal, you will receive your results only by: Marland Kitchen MyChart Message (if you have MyChart) OR . A paper copy in the mail If you have any lab test that is abnormal or we need to change your treatment, we will call you to review the results.  Testing/Procedures: None ordered  Follow-Up: At [facility], you and your health needs are our priority.  As part of our continuing mission to provide you with exceptional heart care, we have created designated Provider Care Teams.  These Care Teams include your primary Cardiologist (physician) and Advanced Practice Providers (APPs -  Physician Assistants and Nurse Practitioners) who all work together to provide you with the care you need, when you need it. . You will need a follow up appointment in 1 year.  Please call our office 2 months in advance to schedule this appointment.  You may see Casandra Doffing, MD or one of the following Advanced Practice Providers on your designated Care Team:   . Lyda Jester, PA-C . Dayna Dunn, PA-C . Ermalinda Barrios, PA-C  Any Other Special Instructions Will Be Listed Below (If Applicable).

## 2018-09-30 DIAGNOSIS — Z7901 Long term (current) use of anticoagulants: Secondary | ICD-10-CM | POA: Diagnosis not present

## 2018-09-30 DIAGNOSIS — E890 Postprocedural hypothyroidism: Secondary | ICD-10-CM | POA: Diagnosis not present

## 2018-09-30 DIAGNOSIS — I10 Essential (primary) hypertension: Secondary | ICD-10-CM | POA: Diagnosis not present

## 2018-09-30 DIAGNOSIS — I739 Peripheral vascular disease, unspecified: Secondary | ICD-10-CM | POA: Diagnosis not present

## 2018-09-30 DIAGNOSIS — G4733 Obstructive sleep apnea (adult) (pediatric): Secondary | ICD-10-CM | POA: Diagnosis not present

## 2018-09-30 DIAGNOSIS — R0600 Dyspnea, unspecified: Secondary | ICD-10-CM | POA: Diagnosis not present

## 2018-09-30 DIAGNOSIS — R05 Cough: Secondary | ICD-10-CM | POA: Diagnosis not present

## 2018-09-30 DIAGNOSIS — R0982 Postnasal drip: Secondary | ICD-10-CM | POA: Diagnosis not present

## 2018-10-14 DIAGNOSIS — I8290 Acute embolism and thrombosis of unspecified vein: Secondary | ICD-10-CM | POA: Diagnosis not present

## 2018-10-14 DIAGNOSIS — Z7901 Long term (current) use of anticoagulants: Secondary | ICD-10-CM | POA: Diagnosis not present

## 2018-10-27 DIAGNOSIS — R69 Illness, unspecified: Secondary | ICD-10-CM | POA: Diagnosis not present

## 2018-10-28 ENCOUNTER — Telehealth: Payer: Self-pay | Admitting: Interventional Cardiology

## 2018-10-28 DIAGNOSIS — R002 Palpitations: Secondary | ICD-10-CM

## 2018-10-28 NOTE — Telephone Encounter (Signed)
° °  Patient calling with concerns   1) What is your heart rate? 104  2) Do you have a log of your heart rate readings (document readings)? 138  3) Do you have any other symptoms? no

## 2018-10-28 NOTE — Telephone Encounter (Signed)
Returned call to patient. Patient is complaining of intermittent episodes of fast heart beats. She states these have became more frequent. She states that her HR ranges from 44-130 bpm. She states that she intermittent episodes of SOB. Denies chest pain or any other Sx. BP 131/85 and HR 104 this morning. Patient takes diltiazem 360 mg at night. Denies cough medicine, but states that she drinks a lot of caffeine. Patient on warfarin. Instructed patient to avoid caffeine. Will forward to Dr. Irish Lack for recommendations. Patient will let us know if her Sx change or worsen or if she has sustained elevated HRs.

## 2018-10-29 ENCOUNTER — Telehealth: Payer: Self-pay | Admitting: Radiology

## 2018-10-29 NOTE — Telephone Encounter (Signed)
Enrolled patient for a 14 Day Zio long term monitor to be mailed. Brief instructions were gone over with the patient and she knows to expect the monitor to arrive in 3-4 days

## 2018-10-29 NOTE — Telephone Encounter (Signed)
Called and made patient aware that Dr. Irish Lack would like to order 14 day zio patch. Patient aware that she will be contacted to arrange.

## 2018-10-29 NOTE — Telephone Encounter (Signed)
14 day Zio patch to further eval

## 2018-11-05 ENCOUNTER — Ambulatory Visit (INDEPENDENT_AMBULATORY_CARE_PROVIDER_SITE_OTHER): Payer: Medicare HMO

## 2018-11-05 DIAGNOSIS — R002 Palpitations: Secondary | ICD-10-CM

## 2018-11-17 ENCOUNTER — Other Ambulatory Visit: Payer: Self-pay

## 2018-11-17 ENCOUNTER — Encounter (INDEPENDENT_AMBULATORY_CARE_PROVIDER_SITE_OTHER): Payer: Medicare HMO | Admitting: Ophthalmology

## 2018-11-17 DIAGNOSIS — H2513 Age-related nuclear cataract, bilateral: Secondary | ICD-10-CM | POA: Diagnosis not present

## 2018-11-17 DIAGNOSIS — H43813 Vitreous degeneration, bilateral: Secondary | ICD-10-CM

## 2018-11-17 DIAGNOSIS — H35033 Hypertensive retinopathy, bilateral: Secondary | ICD-10-CM

## 2018-11-17 DIAGNOSIS — H353132 Nonexudative age-related macular degeneration, bilateral, intermediate dry stage: Secondary | ICD-10-CM

## 2018-11-17 DIAGNOSIS — H33301 Unspecified retinal break, right eye: Secondary | ICD-10-CM | POA: Diagnosis not present

## 2018-11-17 DIAGNOSIS — I10 Essential (primary) hypertension: Secondary | ICD-10-CM

## 2018-11-23 DIAGNOSIS — Z7901 Long term (current) use of anticoagulants: Secondary | ICD-10-CM | POA: Diagnosis not present

## 2018-11-23 DIAGNOSIS — I8290 Acute embolism and thrombosis of unspecified vein: Secondary | ICD-10-CM | POA: Diagnosis not present

## 2018-11-23 DIAGNOSIS — R002 Palpitations: Secondary | ICD-10-CM | POA: Diagnosis not present

## 2018-11-25 ENCOUNTER — Other Ambulatory Visit: Payer: Self-pay

## 2018-11-30 ENCOUNTER — Telehealth: Payer: Self-pay | Admitting: Interventional Cardiology

## 2018-11-30 NOTE — Telephone Encounter (Signed)
-----   Message from Jettie Booze, MD sent at 11/26/2018  4:50 PM EDT ----- If palpitations persist, can try to increase rate control meds.  Otherwise, continue anticoagulation.

## 2018-11-30 NOTE — Telephone Encounter (Signed)
The patient has been notified of the result and verbalized understanding.  All questions (if any) were answered. Cleon Gustin, RN 11/30/2018 1:40 PM

## 2018-11-30 NOTE — Telephone Encounter (Signed)
F/U Message            Patient is returning Brittany's call. Would like a call back.

## 2018-12-02 DIAGNOSIS — Z86711 Personal history of pulmonary embolism: Secondary | ICD-10-CM | POA: Diagnosis not present

## 2018-12-02 DIAGNOSIS — Z7901 Long term (current) use of anticoagulants: Secondary | ICD-10-CM | POA: Diagnosis not present

## 2018-12-02 DIAGNOSIS — I8290 Acute embolism and thrombosis of unspecified vein: Secondary | ICD-10-CM | POA: Diagnosis not present

## 2019-01-06 DIAGNOSIS — Z7901 Long term (current) use of anticoagulants: Secondary | ICD-10-CM | POA: Diagnosis not present

## 2019-01-06 DIAGNOSIS — Z86711 Personal history of pulmonary embolism: Secondary | ICD-10-CM | POA: Diagnosis not present

## 2019-01-06 DIAGNOSIS — I80209 Phlebitis and thrombophlebitis of unspecified deep vessels of unspecified lower extremity: Secondary | ICD-10-CM | POA: Diagnosis not present

## 2019-01-06 DIAGNOSIS — I8290 Acute embolism and thrombosis of unspecified vein: Secondary | ICD-10-CM | POA: Diagnosis not present

## 2019-01-27 DIAGNOSIS — R69 Illness, unspecified: Secondary | ICD-10-CM | POA: Diagnosis not present

## 2019-02-03 DIAGNOSIS — E782 Mixed hyperlipidemia: Secondary | ICD-10-CM | POA: Diagnosis not present

## 2019-02-03 DIAGNOSIS — Z23 Encounter for immunization: Secondary | ICD-10-CM | POA: Diagnosis not present

## 2019-02-03 DIAGNOSIS — R82998 Other abnormal findings in urine: Secondary | ICD-10-CM | POA: Diagnosis not present

## 2019-02-03 DIAGNOSIS — M859 Disorder of bone density and structure, unspecified: Secondary | ICD-10-CM | POA: Diagnosis not present

## 2019-02-10 DIAGNOSIS — Z7901 Long term (current) use of anticoagulants: Secondary | ICD-10-CM | POA: Diagnosis not present

## 2019-02-10 DIAGNOSIS — R69 Illness, unspecified: Secondary | ICD-10-CM | POA: Diagnosis not present

## 2019-02-10 DIAGNOSIS — I10 Essential (primary) hypertension: Secondary | ICD-10-CM | POA: Diagnosis not present

## 2019-02-10 DIAGNOSIS — E782 Mixed hyperlipidemia: Secondary | ICD-10-CM | POA: Diagnosis not present

## 2019-02-10 DIAGNOSIS — I739 Peripheral vascular disease, unspecified: Secondary | ICD-10-CM | POA: Diagnosis not present

## 2019-02-10 DIAGNOSIS — Z0000 Encounter for general adult medical examination without abnormal findings: Secondary | ICD-10-CM | POA: Diagnosis not present

## 2019-02-10 DIAGNOSIS — E890 Postprocedural hypothyroidism: Secondary | ICD-10-CM | POA: Diagnosis not present

## 2019-02-10 DIAGNOSIS — M8580 Other specified disorders of bone density and structure, unspecified site: Secondary | ICD-10-CM | POA: Diagnosis not present

## 2019-02-10 DIAGNOSIS — G4733 Obstructive sleep apnea (adult) (pediatric): Secondary | ICD-10-CM | POA: Diagnosis not present

## 2019-03-09 DIAGNOSIS — Z86711 Personal history of pulmonary embolism: Secondary | ICD-10-CM | POA: Diagnosis not present

## 2019-03-09 DIAGNOSIS — I8290 Acute embolism and thrombosis of unspecified vein: Secondary | ICD-10-CM | POA: Diagnosis not present

## 2019-03-09 DIAGNOSIS — Z7901 Long term (current) use of anticoagulants: Secondary | ICD-10-CM | POA: Diagnosis not present

## 2019-03-09 DIAGNOSIS — I80209 Phlebitis and thrombophlebitis of unspecified deep vessels of unspecified lower extremity: Secondary | ICD-10-CM | POA: Diagnosis not present

## 2019-03-09 DIAGNOSIS — G4733 Obstructive sleep apnea (adult) (pediatric): Secondary | ICD-10-CM | POA: Diagnosis not present

## 2019-03-24 DIAGNOSIS — H2511 Age-related nuclear cataract, right eye: Secondary | ICD-10-CM | POA: Diagnosis not present

## 2019-03-24 DIAGNOSIS — H353132 Nonexudative age-related macular degeneration, bilateral, intermediate dry stage: Secondary | ICD-10-CM | POA: Diagnosis not present

## 2019-03-24 DIAGNOSIS — H35033 Hypertensive retinopathy, bilateral: Secondary | ICD-10-CM | POA: Diagnosis not present

## 2019-03-24 DIAGNOSIS — H2513 Age-related nuclear cataract, bilateral: Secondary | ICD-10-CM | POA: Diagnosis not present

## 2019-03-24 DIAGNOSIS — H25013 Cortical age-related cataract, bilateral: Secondary | ICD-10-CM | POA: Diagnosis not present

## 2019-03-30 DIAGNOSIS — L578 Other skin changes due to chronic exposure to nonionizing radiation: Secondary | ICD-10-CM | POA: Diagnosis not present

## 2019-03-30 DIAGNOSIS — I8312 Varicose veins of left lower extremity with inflammation: Secondary | ICD-10-CM | POA: Diagnosis not present

## 2019-03-30 DIAGNOSIS — L570 Actinic keratosis: Secondary | ICD-10-CM | POA: Diagnosis not present

## 2019-03-30 DIAGNOSIS — I872 Venous insufficiency (chronic) (peripheral): Secondary | ICD-10-CM | POA: Diagnosis not present

## 2019-03-30 DIAGNOSIS — L218 Other seborrheic dermatitis: Secondary | ICD-10-CM | POA: Diagnosis not present

## 2019-03-30 DIAGNOSIS — I8311 Varicose veins of right lower extremity with inflammation: Secondary | ICD-10-CM | POA: Diagnosis not present

## 2019-03-30 DIAGNOSIS — L738 Other specified follicular disorders: Secondary | ICD-10-CM | POA: Diagnosis not present

## 2019-03-30 DIAGNOSIS — L821 Other seborrheic keratosis: Secondary | ICD-10-CM | POA: Diagnosis not present

## 2019-04-07 DIAGNOSIS — I8290 Acute embolism and thrombosis of unspecified vein: Secondary | ICD-10-CM | POA: Diagnosis not present

## 2019-04-07 DIAGNOSIS — Z86711 Personal history of pulmonary embolism: Secondary | ICD-10-CM | POA: Diagnosis not present

## 2019-04-07 DIAGNOSIS — Z7901 Long term (current) use of anticoagulants: Secondary | ICD-10-CM | POA: Diagnosis not present

## 2019-05-11 DIAGNOSIS — I8290 Acute embolism and thrombosis of unspecified vein: Secondary | ICD-10-CM | POA: Diagnosis not present

## 2019-05-11 DIAGNOSIS — Z86711 Personal history of pulmonary embolism: Secondary | ICD-10-CM | POA: Diagnosis not present

## 2019-05-11 DIAGNOSIS — Z7901 Long term (current) use of anticoagulants: Secondary | ICD-10-CM | POA: Diagnosis not present

## 2019-05-17 DIAGNOSIS — H25811 Combined forms of age-related cataract, right eye: Secondary | ICD-10-CM | POA: Diagnosis not present

## 2019-05-17 DIAGNOSIS — H2511 Age-related nuclear cataract, right eye: Secondary | ICD-10-CM | POA: Diagnosis not present

## 2019-05-23 ENCOUNTER — Other Ambulatory Visit: Payer: Self-pay | Admitting: Internal Medicine

## 2019-05-23 DIAGNOSIS — Z1231 Encounter for screening mammogram for malignant neoplasm of breast: Secondary | ICD-10-CM

## 2019-06-06 DIAGNOSIS — H2512 Age-related nuclear cataract, left eye: Secondary | ICD-10-CM | POA: Diagnosis not present

## 2019-06-06 DIAGNOSIS — H25012 Cortical age-related cataract, left eye: Secondary | ICD-10-CM | POA: Diagnosis not present

## 2019-06-09 DIAGNOSIS — G4733 Obstructive sleep apnea (adult) (pediatric): Secondary | ICD-10-CM | POA: Diagnosis not present

## 2019-06-14 DIAGNOSIS — H25812 Combined forms of age-related cataract, left eye: Secondary | ICD-10-CM | POA: Diagnosis not present

## 2019-06-14 DIAGNOSIS — H2512 Age-related nuclear cataract, left eye: Secondary | ICD-10-CM | POA: Diagnosis not present

## 2019-06-14 DIAGNOSIS — H25012 Cortical age-related cataract, left eye: Secondary | ICD-10-CM | POA: Diagnosis not present

## 2019-06-16 DIAGNOSIS — I8290 Acute embolism and thrombosis of unspecified vein: Secondary | ICD-10-CM | POA: Diagnosis not present

## 2019-06-16 DIAGNOSIS — Z7901 Long term (current) use of anticoagulants: Secondary | ICD-10-CM | POA: Diagnosis not present

## 2019-06-16 DIAGNOSIS — Z86711 Personal history of pulmonary embolism: Secondary | ICD-10-CM | POA: Diagnosis not present

## 2019-07-06 ENCOUNTER — Ambulatory Visit: Payer: Medicare HMO | Admitting: Adult Health

## 2019-07-06 ENCOUNTER — Ambulatory Visit
Admission: RE | Admit: 2019-07-06 | Discharge: 2019-07-06 | Disposition: A | Discharge status: Home / Self Care | Payer: Medicare HMO | Source: Ambulatory Visit | Attending: Internal Medicine | Admitting: Internal Medicine

## 2019-07-06 ENCOUNTER — Encounter: Payer: Self-pay | Admitting: Adult Health

## 2019-07-06 ENCOUNTER — Other Ambulatory Visit: Payer: Self-pay

## 2019-07-06 VITALS — BP 138/76 | HR 87 | Temp 97.5°F | Ht 64.0 in | Wt 253.8 lb

## 2019-07-06 DIAGNOSIS — Z9989 Dependence on other enabling machines and devices: Secondary | ICD-10-CM | POA: Diagnosis not present

## 2019-07-06 DIAGNOSIS — G4733 Obstructive sleep apnea (adult) (pediatric): Secondary | ICD-10-CM | POA: Diagnosis not present

## 2019-07-06 DIAGNOSIS — Z1231 Encounter for screening mammogram for malignant neoplasm of breast: Secondary | ICD-10-CM

## 2019-07-06 NOTE — Progress Notes (Signed)
PATIENT: [name] DOB: 1940-08-11  REASON FOR VISIT: follow up HISTORY FROM: patient  HISTORY OF PRESENT ILLNESS: Today 07/06/19:  [name] is an 79 year old female with a history of obstructive sleep apnea on CPAP. Her download indicates that she use her machine nightly for compliance of 100%. She used her machine greater than 4 hours each night. On average she uses her machine 5 hours and 50 minutes. Her residual AHI is 0.7 on 10.6 cm of water with EPR 3. Her leak in the 95th percentile is 24.8 L/min.  She reports that the CPAP is working well for her.  She denies any new issues.  She returns today for an evaluation.  HISTORY 06/30/18 [name] is a 79 year old female with a history of obstructive sleep apnea on CPAP.  She returns today for follow-up.  We were unable to obtain a wireless download.  The patient states that she does not have a chip in her machine.  We have reached out to her DME company for a download.  The patient states that she uses the machine every night.  She denies any significant issues with her CPAP.  She continues to notice the benefit.  She states on occasion she will have a leak but she does readjust her mask.  She returns today for evaluation.   REVIEW OF SYSTEMS: Out of a complete 14 system review of symptoms, the patient complains only of the following symptoms, and all other reviewed systems are negative.  ESS 3 FSS 17   ALLERGIES: Allergies  Allergen Reactions  . Codeine Other (See Comments)    Causes hyperactivity  . Erythromycin Itching and Other (See Comments)    Also with jitters  . Tetracyclines & Related Itching    Also with jitters  . Statins Other (See Comments)    Leg muscle weakness  . Latex Rash    HOME MEDICATIONS: Outpatient Medications Prior to Visit  Medication Sig Dispense Refill  . alendronate (FOSAMAX) 70 MG tablet Take 70 mg by mouth once a week.     . Carboxymethylcellulose Sodium (REFRESH  LIQUIGEL OP) Place 1 drop into both eyes at bedtime.    Marland Kitchen diltiazem (TIAZAC) 360 MG 24 hr capsule Take 1 capsule by mouth daily.    . fluticasone (FLONASE) 50 MCG/ACT nasal spray Place 1 spray into the nose daily as needed for allergies. For allergies.    Marland Kitchen irbesartan-hydrochlorothiazide (AVALIDE) 300-12.5 MG tablet Take 0.5 tablets by mouth daily. 45 tablet 3  . levothyroxine (SYNTHROID, LEVOTHROID) 100 MCG tablet Take 100 mcg by mouth daily before breakfast.   1  . LORazepam (ATIVAN) 1 MG tablet Take 1 mg by mouth as needed for anxiety.     . Lutein-Zeaxanthin 25-5 MG CAPS Take 1 tablet by mouth daily. EYE VITAMIN    . warfarin (COUMADIN) 5 MG tablet Take 5 mg by mouth every Wednesday. Take on Wednesday.  2  . warfarin (COUMADIN) 7.5 MG tablet Take 7.5 mg by mouth See admin instructions. Dorene Grebe Tues Thurs Fri Sat     No facility-administered medications prior to visit.    PAST MEDICAL HISTORY: Past Medical History:  Diagnosis Date  . Anxiety    takes Lorazepam daily as needed  . Arthritis   . Cancer ([place])    thyroid  . Cataracts, bilateral    immature  . Chronic back pain    compressed vertebrae,takes Fosamax weekly  . Family history of adverse reaction to anesthesia  granddaughter gets very sick and mean  . History of blood transfusion    no abnormal reaction noted  . History of bronchitis 05/2015  . History of colon polyps    benign  . Hyperlipidemia    takes Zetia daily  . Hypertension    takes Diltiazem and Avapro daily  . Hypothyroidism    takes Synthroid daily  . Ischemic colitis ([place])    hx of-many yrs ago  . Joint pain   . Joint swelling   . OSA on CPAP    05-08-12 AHi was 46, RDI  53. titrated to   9 cm water  with 3 cm flex.-nadir 75%  . PE (pulmonary embolism)    takes Coumadin daily  . Peripheral edema   . Pneumonia 1992   hx of  . Sarcoidosis   . Shortness of breath   . Urinary frequency   . Weakness    left leg.Numbness and tingling.Has sciatica   . Wheeze    occaionally. Not new    PAST SURGICAL HISTORY: Past Surgical History:  Procedure Laterality Date  . ABDOMINAL HYSTERECTOMY    . APPENDECTOMY    . BREAST BIOPSY     biopsies on right x 2  . BREAST CYST ASPIRATION Left   . CHOLECYSTECTOMY N/A 11/29/2015   Procedure: LAPAROSCOPIC CHOLECYSTECTOMY;  Surgeon: [name], MD;  Location: [facility];  Service: General;  Laterality: N/A;  . COLONOSCOPY    . DILATION AND CURETTAGE OF UTERUS    . LIVER BIOPSY    . MELANOMA EXCISION     removed from left leg  . scalene surgery  1972   "trying to find out what was wrong with her lungs"  . THYROID SURGERY      FAMILY HISTORY: Family History  Problem Relation Age of Onset  . CVA Mother   . Heart attack Neg Hx     SOCIAL HISTORY: Social History   Socioeconomic History  . Marital status: Married    Spouse name: Not on file  . Number of children: Not on file  . Years of education: Not on file  . Highest education level: Not on file  Occupational History  . Occupation: Retired    Fish farm manager: RETIRED  Tobacco Use  . Smoking status: Never Smoker  . Smokeless tobacco: Never Used  Substance and Sexual Activity  . Alcohol use: No  . Drug use: No  . Sexual activity: Not on file  Other Topics Concern  . Not on file  Social History Narrative  . Not on file   Social Determinants of Health   Financial Resource Strain:   . Difficulty of Paying Living Expenses: Not on file  Food Insecurity:   . Worried About Charity fundraiser in the Last Year: Not on file  . Ran Out of Food in the Last Year: Not on file  Transportation Needs:   . Lack of Transportation (Medical): Not on file  . Lack of Transportation (Non-Medical): Not on file  Physical Activity:   . Days of Exercise per Week: Not on file  . Minutes of Exercise per Session: Not on file  Stress:   . Feeling of Stress : Not on file  Social Connections:   . Frequency of Communication with Friends and Family: Not on file    . Frequency of Social Gatherings with Friends and Family: Not on file  . Attends Religious Services: Not on file  . Active Member of Clubs or Organizations: Not on  file  . Attends Archivist Meetings: Not on file  . Marital Status: Not on file  Intimate Partner Violence:   . Fear of Current or Ex-Partner: Not on file  . Emotionally Abused: Not on file  . Physically Abused: Not on file  . Sexually Abused: Not on file      PHYSICAL EXAM  Vitals:   07/06/19 0825  BP: 138/76  Pulse: 87  Temp: (!) 97.5 F (36.4 C)  Weight: 253 lb 12.8 oz (115.1 kg)  Height: 5\' 4"  (1.626 m)   Body mass index is 43.56 kg/m.  Generalized: Well developed, in no acute distress  Chest: Lungs clear to auscultation bilaterally  Neurological examination  Mentation: Alert oriented to time, place, history taking. Follows all commands speech and language fluent Cranial nerve II-XII: Extraocular movements were full, visual field were full on confrontational test Head turning and shoulder shrug  were normal and symmetric. Motor: The motor testing reveals 5 over 5 strength of all 4 extremities. Good symmetric motor tone is noted throughout.  Sensory: Sensory testing is intact to soft touch on all 4 extremities. No evidence of extinction is noted.  Gait and station: Gait is normal.    DIAGNOSTIC DATA (LABS, IMAGING, TESTING) - I reviewed patient records, labs, notes, testing and imaging myself where available.  Lab Results  Component Value Date   WBC 7.9 11/28/2015   HGB 14.4 11/28/2015   HCT 44.2 11/28/2015   MCV 88.6 11/28/2015   PLT 239 11/28/2015      Component Value Date/Time   NA 141 11/28/2015 0844   NA 140 11/29/2014 0839   K 3.9 11/28/2015 0844   K 4.4 11/29/2014 0839   CL 108 11/28/2015 0844   CL 104 04/29/2012 1145   CO2 26 11/28/2015 0844   CO2 28 11/29/2014 0839   GLUCOSE 97 11/28/2015 0844   GLUCOSE 121 11/29/2014 0839   GLUCOSE 93 04/29/2012 1145   BUN 12  11/28/2015 0844   BUN 13.5 11/29/2014 0839   CREATININE 0.79 11/28/2015 0844   CREATININE 0.8 11/29/2014 0839   CALCIUM 9.5 11/28/2015 0844   CALCIUM 9.1 11/29/2014 0839   PROT 7.4 11/29/2014 0839   ALBUMIN 3.4 (L) 11/29/2014 0839   AST 19 11/29/2014 0839   ALT 24 11/29/2014 0839   ALKPHOS 84 11/29/2014 0839   BILITOT 0.42 11/29/2014 0839   GFRNONAA >60 11/28/2015 0844   GFRAA >60 11/28/2015 0844    Lab Results  Component Value Date   TSH 2.500 05/24/2014      ASSESSMENT AND PLAN 79 y.o. year old female  has a past medical history of Anxiety, Arthritis, Cancer ([place]), Cataracts, bilateral, Chronic back pain, Family history of adverse reaction to anesthesia, History of blood transfusion, History of bronchitis (05/2015), History of colon polyps, Hyperlipidemia, Hypertension, Hypothyroidism, Ischemic colitis ([place]), Joint pain, Joint swelling, OSA on CPAP, PE (pulmonary embolism), Peripheral edema, Pneumonia (1992), Sarcoidosis, Shortness of breath, Urinary frequency, Weakness, and Wheeze. here with:  1. Obstructive sleep apnea on CPAP  Patient CPAP download shows excellent compliance and good treatment of her apnea.  She is encouraged to continue using CPAP nightly and greater than 4 hours each night.  She is advised that if her symptoms worsen or she develops new symptoms she should let us know.  She will follow-up in 1 year or sooner if needed   I spent 15 minutes with the patient. 50% of this time was spent reviewing CPAP download   Ward Givens,  MSN, NP-C 07/06/2019, 8:38 AM [facility] [address] [place], [place] [address] [phone]

## 2019-07-06 NOTE — Patient Instructions (Signed)

## 2019-07-07 ENCOUNTER — Ambulatory Visit: Payer: Medicare HMO

## 2019-07-20 DIAGNOSIS — Z7901 Long term (current) use of anticoagulants: Secondary | ICD-10-CM | POA: Diagnosis not present

## 2019-07-20 DIAGNOSIS — Z86711 Personal history of pulmonary embolism: Secondary | ICD-10-CM | POA: Diagnosis not present

## 2019-07-20 DIAGNOSIS — I8290 Acute embolism and thrombosis of unspecified vein: Secondary | ICD-10-CM | POA: Diagnosis not present

## 2019-07-31 ENCOUNTER — Ambulatory Visit: Payer: Medicare HMO | Attending: Internal Medicine

## 2019-07-31 DIAGNOSIS — Z23 Encounter for immunization: Secondary | ICD-10-CM | POA: Insufficient documentation

## 2019-07-31 NOTE — Progress Notes (Signed)
   Covid-19 Vaccination Clinic  Name:  [name]    MRN: [id number] DOB: 09-01-1940  07/31/2019  [name] was observed post Covid-19 immunization for 15 minutes without incidence. She was provided with Vaccine Information Sheet and instruction to access the V-Safe system.   [name] was instructed to call 911 with any severe reactions post vaccine: Marland Kitchen Difficulty breathing  . Swelling of your face and throat  . A fast heartbeat  . A bad rash all over your body  . Dizziness and weakness    Immunizations Administered    Name Date Dose VIS Date Route   Pfizer COVID-19 Vaccine 07/31/2019 11:39 AM 0.3 mL 05/20/2019 Intramuscular   Manufacturer: [place]   Lot: [id number]   [place]: [id number]

## 2019-08-10 DIAGNOSIS — I80209 Phlebitis and thrombophlebitis of unspecified deep vessels of unspecified lower extremity: Secondary | ICD-10-CM | POA: Diagnosis not present

## 2019-08-10 DIAGNOSIS — Z86711 Personal history of pulmonary embolism: Secondary | ICD-10-CM | POA: Diagnosis not present

## 2019-08-10 DIAGNOSIS — Z7901 Long term (current) use of anticoagulants: Secondary | ICD-10-CM | POA: Diagnosis not present

## 2019-08-24 ENCOUNTER — Ambulatory Visit: Payer: Medicare HMO | Attending: Internal Medicine

## 2019-08-24 DIAGNOSIS — Z23 Encounter for immunization: Secondary | ICD-10-CM

## 2019-08-24 NOTE — Progress Notes (Signed)
   Covid-19 Vaccination Clinic  Name:  [name]    MRN: [id number] DOB: 1940/09/21  08/24/2019  [name] was observed post Covid-19 immunization for 15 minutes without incident. She was provided with Vaccine Information Sheet and instruction to access the V-Safe system.   [name] was instructed to call 911 with any severe reactions post vaccine: Marland Kitchen Difficulty breathing  . Swelling of face and throat  . A fast heartbeat  . A bad rash all over body  . Dizziness and weakness   Immunizations Administered    Name Date Dose VIS Date Route   Pfizer COVID-19 Vaccine 08/24/2019  8:39 AM 0.3 mL 05/20/2019 Intramuscular   Manufacturer: [place]   Lot: [id number]   [place]: [id number]

## 2019-09-15 DIAGNOSIS — Z86711 Personal history of pulmonary embolism: Secondary | ICD-10-CM | POA: Diagnosis not present

## 2019-09-15 DIAGNOSIS — Z7901 Long term (current) use of anticoagulants: Secondary | ICD-10-CM | POA: Diagnosis not present

## 2019-09-15 DIAGNOSIS — I8290 Acute embolism and thrombosis of unspecified vein: Secondary | ICD-10-CM | POA: Diagnosis not present

## 2019-09-19 DIAGNOSIS — G4733 Obstructive sleep apnea (adult) (pediatric): Secondary | ICD-10-CM | POA: Diagnosis not present

## 2019-09-20 DIAGNOSIS — G4733 Obstructive sleep apnea (adult) (pediatric): Secondary | ICD-10-CM | POA: Diagnosis not present

## 2019-10-05 DIAGNOSIS — I872 Venous insufficiency (chronic) (peripheral): Secondary | ICD-10-CM | POA: Diagnosis not present

## 2019-10-05 DIAGNOSIS — L700 Acne vulgaris: Secondary | ICD-10-CM | POA: Diagnosis not present

## 2019-10-05 DIAGNOSIS — L570 Actinic keratosis: Secondary | ICD-10-CM | POA: Diagnosis not present

## 2019-10-05 DIAGNOSIS — I8311 Varicose veins of right lower extremity with inflammation: Secondary | ICD-10-CM | POA: Diagnosis not present

## 2019-10-05 DIAGNOSIS — I8312 Varicose veins of left lower extremity with inflammation: Secondary | ICD-10-CM | POA: Diagnosis not present

## 2019-10-12 DIAGNOSIS — Z86711 Personal history of pulmonary embolism: Secondary | ICD-10-CM | POA: Diagnosis not present

## 2019-10-12 DIAGNOSIS — I8290 Acute embolism and thrombosis of unspecified vein: Secondary | ICD-10-CM | POA: Diagnosis not present

## 2019-10-12 DIAGNOSIS — Z7901 Long term (current) use of anticoagulants: Secondary | ICD-10-CM | POA: Diagnosis not present

## 2019-10-19 DIAGNOSIS — G4733 Obstructive sleep apnea (adult) (pediatric): Secondary | ICD-10-CM | POA: Diagnosis not present

## 2019-11-09 DIAGNOSIS — R69 Illness, unspecified: Secondary | ICD-10-CM | POA: Diagnosis not present

## 2019-11-15 DIAGNOSIS — Z7901 Long term (current) use of anticoagulants: Secondary | ICD-10-CM | POA: Diagnosis not present

## 2019-11-15 DIAGNOSIS — I8290 Acute embolism and thrombosis of unspecified vein: Secondary | ICD-10-CM | POA: Diagnosis not present

## 2019-11-15 DIAGNOSIS — Z86711 Personal history of pulmonary embolism: Secondary | ICD-10-CM | POA: Diagnosis not present

## 2019-11-19 DIAGNOSIS — G4733 Obstructive sleep apnea (adult) (pediatric): Secondary | ICD-10-CM | POA: Diagnosis not present

## 2019-11-23 ENCOUNTER — Other Ambulatory Visit: Payer: Self-pay

## 2019-11-23 ENCOUNTER — Encounter (INDEPENDENT_AMBULATORY_CARE_PROVIDER_SITE_OTHER): Payer: Medicare HMO | Admitting: Ophthalmology

## 2019-11-23 DIAGNOSIS — H43813 Vitreous degeneration, bilateral: Secondary | ICD-10-CM | POA: Diagnosis not present

## 2019-11-23 DIAGNOSIS — H35033 Hypertensive retinopathy, bilateral: Secondary | ICD-10-CM

## 2019-11-23 DIAGNOSIS — I10 Essential (primary) hypertension: Secondary | ICD-10-CM | POA: Diagnosis not present

## 2019-11-23 DIAGNOSIS — H353132 Nonexudative age-related macular degeneration, bilateral, intermediate dry stage: Secondary | ICD-10-CM | POA: Diagnosis not present

## 2019-12-27 DIAGNOSIS — Z86711 Personal history of pulmonary embolism: Secondary | ICD-10-CM | POA: Diagnosis not present

## 2019-12-27 DIAGNOSIS — I8290 Acute embolism and thrombosis of unspecified vein: Secondary | ICD-10-CM | POA: Diagnosis not present

## 2019-12-27 DIAGNOSIS — I80209 Phlebitis and thrombophlebitis of unspecified deep vessels of unspecified lower extremity: Secondary | ICD-10-CM | POA: Diagnosis not present

## 2019-12-27 DIAGNOSIS — Z7901 Long term (current) use of anticoagulants: Secondary | ICD-10-CM | POA: Diagnosis not present

## 2019-12-29 DIAGNOSIS — R69 Illness, unspecified: Secondary | ICD-10-CM | POA: Diagnosis not present

## 2020-01-09 DIAGNOSIS — R69 Illness, unspecified: Secondary | ICD-10-CM | POA: Diagnosis not present

## 2020-01-10 DIAGNOSIS — R69 Illness, unspecified: Secondary | ICD-10-CM | POA: Diagnosis not present

## 2020-01-29 NOTE — Progress Notes (Signed)
Cardiology Office Note   Date:  01/31/2020   ID:  [name], DOB 1940/07/22, MRN [id number]  PCP:  Leanna Battles, MD    No chief complaint on file.  Anticoagulated  Wt Readings from Last 3 Encounters:  01/31/20 245 lb (111.1 kg)  07/06/19 253 lb 12.8 oz (115.1 kg)  09/14/18 235 lb (106.6 kg)       History of Present Illness: [name] is a 79 y.o. female  hasa h/o of DVT/PE. SHe was treated with Coumadin for 6 months. No reason found for DVTs. She had a stress test in 4/09 which was negative for ischemia. She exercised for 6:15 seconds. She had IBS but in 4/10, had persistent pain and had blood in her stool while off coumadin, she was diagnosed with ischemic colitis. Later, she states she was diagnosed with polycythemia, which was being managed by Dr. Jamse Arn. She was also placed on Xarelto, after a CT scan but then switched back to Coumadin.;.  BP has been well controlled. See below. Readings typically higher in the MDs office.   SHefelt poorly off of brand name Avapro. Has tried losartan and irbesartan. BP was not well controlled off of Avapro. Cough resolved.  In the past, "She had a skin cancer removed. She required some ABx at that time. Cholecystectomy done in 6/17. She Did well.  She tries amlodipine but had feet swelling.  She has tolerated avapro.    She has had some intermittent palpitations, worse when she took cough medicine.  It stopped after 7 days.  No sx in the last week.  Sx improved with decrease caffeine.  She felt stress with the virus news."   Decreased irbesartan/HCTZ to half tab in 2020 due to low BP readings.   Since the last visit, she continues to stay in.  Limited by back pain.  Considering injections, but sx have improved.  Past Medical History:  Diagnosis Date   Anxiety    takes Lorazepam daily as needed   Arthritis    Cancer ([place])    thyroid   Cataracts, bilateral    immature   Chronic back pain     compressed vertebrae,takes Fosamax weekly   Family history of adverse reaction to anesthesia    granddaughter gets very sick and mean   History of blood transfusion    no abnormal reaction noted   History of bronchitis 05/2015   History of colon polyps    benign   Hyperlipidemia    takes Zetia daily   Hypertension    takes Diltiazem and Avapro daily   Hypothyroidism    takes Synthroid daily   Ischemic colitis ([place])    hx of-many yrs ago   Joint pain    Joint swelling    OSA on CPAP    05-08-12 AHi was 46, RDI  53. titrated to   9 cm water  with 3 cm flex.-nadir 75%   PE (pulmonary embolism)    takes Coumadin daily   Peripheral edema    Pneumonia 1992   hx of   Sarcoidosis    Shortness of breath    Urinary frequency    Weakness    left leg.Numbness and tingling.Has sciatica   Wheeze    occaionally. Not new    Past Surgical History:  Procedure Laterality Date   ABDOMINAL HYSTERECTOMY     APPENDECTOMY     BREAST BIOPSY     biopsies on right x 2   BREAST  CYST ASPIRATION Left    CHOLECYSTECTOMY N/A 11/29/2015   Procedure: LAPAROSCOPIC CHOLECYSTECTOMY;  Surgeon: [name], MD;  Location: [facility];  Service: General;  Laterality: N/A;   COLONOSCOPY     DILATION AND CURETTAGE OF UTERUS     LIVER BIOPSY     MELANOMA EXCISION     removed from left leg   scalene surgery  1972   "trying to find out what was wrong with her lungs"   THYROID SURGERY       Current Outpatient Medications  Medication Sig Dispense Refill   alendronate (FOSAMAX) 70 MG tablet Take 70 mg by mouth once a week.      Carboxymethylcellulose Sodium (REFRESH LIQUIGEL OP) Place 1 drop into both eyes at bedtime.     diltiazem (TIAZAC) 360 MG 24 hr capsule Take 1 capsule by mouth daily.     fluticasone (FLONASE) 50 MCG/ACT nasal spray Place 1 spray into the nose daily as needed for allergies. For allergies.     irbesartan-hydrochlorothiazide (AVALIDE) 300-12.5 MG tablet  Take 0.5 tablets by mouth daily. 45 tablet 3   levothyroxine (SYNTHROID, LEVOTHROID) 100 MCG tablet Take 100 mcg by mouth daily before breakfast.   1   LORazepam (ATIVAN) 1 MG tablet Take 1 mg by mouth as needed for anxiety.      Lutein-Zeaxanthin 25-5 MG CAPS Take 1 tablet by mouth daily. EYE VITAMIN     warfarin (COUMADIN) 5 MG tablet Take 5 mg by mouth every Wednesday. Take on Wednesday.  2   warfarin (COUMADIN) 7.5 MG tablet Take 7.5 mg by mouth See admin instructions. Sun Mon Tues Thurs Fri Sat     No current facility-administered medications for this visit.    Allergies:   Codeine, Erythromycin, Tetracyclines & related, Statins, and Latex    Social History:  The patient  reports that she has never smoked. She has never used smokeless tobacco. She reports that she does not drink alcohol and does not use drugs.   Family History:  The patient's family history includes CVA in her mother.    ROS:  Please see the history of present illness.   Otherwise, review of systems are positive for rare palpitations.   All other systems are reviewed and negative.    PHYSICAL EXAM: VS:  BP (!) 158/88    Pulse 89    Ht 5\' 4"  (1.626 m)    Wt 245 lb (111.1 kg)    SpO2 94%    BMI 42.05 kg/m  , BMI Body mass index is 42.05 kg/m. GEN: Well nourished, well developed, in no acute distress  HEENT: normal  Neck: no JVD, carotid bruits, or masses Cardiac: Irregularly irregular; no murmurs, rubs, or gallops,; mild LE pitting edema  Respiratory:  clear to auscultation bilaterally, normal work of breathing GI: soft, nontender, nondistended, + BS MS: no deformity or atrophy  Skin: warm and dry, no rash Neuro:  Strength and sensation are intact Psych: euthymic mood, full affect   EKG:   The ekg ordered today demonstrates AFib, nonspecific ST changes- no significant change since 2019   Recent Labs: No results found for requested labs within last 8760 hours.   Lipid Panel No results found for:  CHOL, TRIG, HDL, CHOLHDL, VLDL, LDLCALC, LDLDIRECT   Other studies Reviewed: Additional studies/ records that were reviewed today with results demonstrating: 2020 monitor reviewed- 100% AFib.   ASSESSMENT AND PLAN:  1. AFib: rate controlled.  COumadin for stroke prevention. Diltiazem adequate for rate  control. 2. HTN: Home readings are in the 110-130s range.  The current medical regimen is effective;  continue present plan and medications.  Low salt diet.   3. Hyperlipidemia: statin intolerant in the past.   4. Obesity: Difficult to lose weight due to activity limitations.  Whole food plant based diet. CPAP for OSA.  5. Anticoagulated: for chronic DVT.  No bleeding issues.  6. LE edema: Elevate legs and use compression stockings.  She has some diuretic in her BP meds.  If this was more bothersome, could stop HCTZ and give prn Lasix.  Given her issues with meds in the past, tolerating certain brands, I would hold off until sx were more bothersome and could not be managed conservatively.  7. Labs to be checked with Dr. Philip Aspen.    Current medicines are reviewed at length with the patient today.  The patient concerns regarding her medicines were addressed.  The following changes have been made:  No change  Labs/ tests ordered today include:  No orders of the defined types were placed in this encounter.   Recommend 150 minutes/week of aerobic exercise Low fat, low carb, high fiber diet recommended  Disposition:   FU in 1 year   Signed, Larae Grooms, MD  01/31/2020 10:17 AM    [facility] [address], [place], [place]  [address] Phone: [phone]; Fax: [phone]

## 2020-01-31 ENCOUNTER — Ambulatory Visit: Payer: Medicare HMO | Admitting: Interventional Cardiology

## 2020-01-31 ENCOUNTER — Other Ambulatory Visit: Payer: Self-pay

## 2020-01-31 ENCOUNTER — Encounter: Payer: Self-pay | Admitting: Interventional Cardiology

## 2020-01-31 VITALS — BP 158/88 | HR 89 | Ht 64.0 in | Wt 245.0 lb

## 2020-01-31 DIAGNOSIS — Z7901 Long term (current) use of anticoagulants: Secondary | ICD-10-CM

## 2020-01-31 DIAGNOSIS — R002 Palpitations: Secondary | ICD-10-CM | POA: Diagnosis not present

## 2020-01-31 DIAGNOSIS — I10 Essential (primary) hypertension: Secondary | ICD-10-CM | POA: Diagnosis not present

## 2020-01-31 DIAGNOSIS — I825Y2 Chronic embolism and thrombosis of unspecified deep veins of left proximal lower extremity: Secondary | ICD-10-CM

## 2020-01-31 NOTE — Patient Instructions (Signed)

## 2020-02-01 DIAGNOSIS — I80209 Phlebitis and thrombophlebitis of unspecified deep vessels of unspecified lower extremity: Secondary | ICD-10-CM | POA: Diagnosis not present

## 2020-02-01 DIAGNOSIS — Z7901 Long term (current) use of anticoagulants: Secondary | ICD-10-CM | POA: Diagnosis not present

## 2020-02-01 DIAGNOSIS — Z86711 Personal history of pulmonary embolism: Secondary | ICD-10-CM | POA: Diagnosis not present

## 2020-02-10 DIAGNOSIS — E782 Mixed hyperlipidemia: Secondary | ICD-10-CM | POA: Diagnosis not present

## 2020-02-10 DIAGNOSIS — E890 Postprocedural hypothyroidism: Secondary | ICD-10-CM | POA: Diagnosis not present

## 2020-02-10 DIAGNOSIS — E559 Vitamin D deficiency, unspecified: Secondary | ICD-10-CM | POA: Diagnosis not present

## 2020-02-17 DIAGNOSIS — I10 Essential (primary) hypertension: Secondary | ICD-10-CM | POA: Diagnosis not present

## 2020-02-17 DIAGNOSIS — R82998 Other abnormal findings in urine: Secondary | ICD-10-CM | POA: Diagnosis not present

## 2020-03-07 DIAGNOSIS — Z86711 Personal history of pulmonary embolism: Secondary | ICD-10-CM | POA: Diagnosis not present

## 2020-03-07 DIAGNOSIS — I80209 Phlebitis and thrombophlebitis of unspecified deep vessels of unspecified lower extremity: Secondary | ICD-10-CM | POA: Diagnosis not present

## 2020-03-07 DIAGNOSIS — Z23 Encounter for immunization: Secondary | ICD-10-CM | POA: Diagnosis not present

## 2020-03-07 DIAGNOSIS — Z7901 Long term (current) use of anticoagulants: Secondary | ICD-10-CM | POA: Diagnosis not present

## 2020-04-04 DIAGNOSIS — I8312 Varicose veins of left lower extremity with inflammation: Secondary | ICD-10-CM | POA: Diagnosis not present

## 2020-04-04 DIAGNOSIS — L821 Other seborrheic keratosis: Secondary | ICD-10-CM | POA: Diagnosis not present

## 2020-04-04 DIAGNOSIS — L820 Inflamed seborrheic keratosis: Secondary | ICD-10-CM | POA: Diagnosis not present

## 2020-04-04 DIAGNOSIS — I8311 Varicose veins of right lower extremity with inflammation: Secondary | ICD-10-CM | POA: Diagnosis not present

## 2020-04-04 DIAGNOSIS — I872 Venous insufficiency (chronic) (peripheral): Secondary | ICD-10-CM | POA: Diagnosis not present

## 2020-04-06 DIAGNOSIS — G4733 Obstructive sleep apnea (adult) (pediatric): Secondary | ICD-10-CM | POA: Diagnosis not present

## 2020-04-11 DIAGNOSIS — Z7901 Long term (current) use of anticoagulants: Secondary | ICD-10-CM | POA: Diagnosis not present

## 2020-04-11 DIAGNOSIS — I80209 Phlebitis and thrombophlebitis of unspecified deep vessels of unspecified lower extremity: Secondary | ICD-10-CM | POA: Diagnosis not present

## 2020-04-11 DIAGNOSIS — Z86711 Personal history of pulmonary embolism: Secondary | ICD-10-CM | POA: Diagnosis not present

## 2020-05-07 DIAGNOSIS — G4733 Obstructive sleep apnea (adult) (pediatric): Secondary | ICD-10-CM | POA: Diagnosis not present

## 2020-05-15 DIAGNOSIS — Z7901 Long term (current) use of anticoagulants: Secondary | ICD-10-CM | POA: Diagnosis not present

## 2020-05-15 DIAGNOSIS — Z86711 Personal history of pulmonary embolism: Secondary | ICD-10-CM | POA: Diagnosis not present

## 2020-05-15 DIAGNOSIS — I80209 Phlebitis and thrombophlebitis of unspecified deep vessels of unspecified lower extremity: Secondary | ICD-10-CM | POA: Diagnosis not present

## 2020-06-06 DIAGNOSIS — G4733 Obstructive sleep apnea (adult) (pediatric): Secondary | ICD-10-CM | POA: Diagnosis not present

## 2020-06-20 DIAGNOSIS — I80209 Phlebitis and thrombophlebitis of unspecified deep vessels of unspecified lower extremity: Secondary | ICD-10-CM | POA: Diagnosis not present

## 2020-06-20 DIAGNOSIS — D689 Coagulation defect, unspecified: Secondary | ICD-10-CM | POA: Diagnosis not present

## 2020-06-20 DIAGNOSIS — Z86711 Personal history of pulmonary embolism: Secondary | ICD-10-CM | POA: Diagnosis not present

## 2020-06-20 DIAGNOSIS — Z7901 Long term (current) use of anticoagulants: Secondary | ICD-10-CM | POA: Diagnosis not present

## 2020-07-25 DIAGNOSIS — I80209 Phlebitis and thrombophlebitis of unspecified deep vessels of unspecified lower extremity: Secondary | ICD-10-CM | POA: Diagnosis not present

## 2020-07-25 DIAGNOSIS — Z86711 Personal history of pulmonary embolism: Secondary | ICD-10-CM | POA: Diagnosis not present

## 2020-07-25 DIAGNOSIS — Z7901 Long term (current) use of anticoagulants: Secondary | ICD-10-CM | POA: Diagnosis not present

## 2020-08-06 ENCOUNTER — Encounter: Payer: Self-pay | Admitting: Adult Health

## 2020-08-06 ENCOUNTER — Telehealth: Payer: Self-pay

## 2020-08-06 ENCOUNTER — Ambulatory Visit: Payer: Medicare HMO | Admitting: Adult Health

## 2020-08-06 VITALS — BP 140/80 | HR 74 | Ht 63.0 in | Wt 250.0 lb

## 2020-08-06 DIAGNOSIS — G4733 Obstructive sleep apnea (adult) (pediatric): Secondary | ICD-10-CM

## 2020-08-06 DIAGNOSIS — Z9989 Dependence on other enabling machines and devices: Secondary | ICD-10-CM | POA: Diagnosis not present

## 2020-08-06 NOTE — Patient Instructions (Signed)
Continue using CPAP nightly and greater than 4 hours each night  Mask refitting today If your symptoms worsen or you develop new symptoms please let us know.

## 2020-08-06 NOTE — Telephone Encounter (Signed)
Patient came by the sleep lab for a mask fitting. Pt is currently wearing nasal pillows and a large leak is showing on her download.   I fitted patient with a medium Resmed Airfit F10 ffm. Pt was hooked up to machine to check for leaks. Pt stated mask was comfortable. Pt was educated on how to take mask on and off, and also how to further adjust sizing if need be.

## 2020-08-06 NOTE — Telephone Encounter (Signed)
Thank you :)

## 2020-08-06 NOTE — Progress Notes (Signed)
PATIENT: [name] DOB: Oct 18, 1940  REASON FOR VISIT: follow up HISTORY FROM: patient  HISTORY OF PRESENT ILLNESS: Today 08/06/20:  Ms. [name] is an 80 year old female with a history of obstructive sleep apnea on CPAP.  She reports that the CPAP works well for her.  She has the nasal pillows and reports that sometime during the night she will open her mouth to breathe.  Her mouth becomes very dry.  She is open to trying a different mask.  Her download is below.  Compliance Report Usage 07/07/2020 - 08/05/2020 Usage days 30/30 days (100%) >= 4 hours 30 days (100%) Average usage (days used) 5 hours 47 minutes  AirSense 10 AutoSet Serial number [id number] Mode CPAP Set pressure 10.6 cmH2O EPR Fulltime EPR level 3 Therapy Leaks - L/min Median: 23.4 95th percentile: 42.3 Maximum: 64.8 Events per hour AI: 0.5 HI: 0.2 AHI: 0.7 Apnea Index Central: 0.0 Obstructive: 0.4 Unknown: 0.1    REVIEW OF SYSTEMS: Out of a complete 14 system review of symptoms, the patient complains only of the following symptoms, and all other reviewed systems are negative.  FSS 11 ESS 8  ALLERGIES: Allergies  Allergen Reactions  . Codeine Other (See Comments)    Causes hyperactivity  . Erythromycin Itching and Other (See Comments)    Also with jitters  . Tetracyclines & Related Itching    Also with jitters  . Statins Other (See Comments)    Leg muscle weakness  . Latex Rash    HOME MEDICATIONS: Outpatient Medications Prior to Visit  Medication Sig Dispense Refill  . alendronate (FOSAMAX) 70 MG tablet Take 70 mg by mouth once a week.     . Carboxymethylcellulose Sodium (REFRESH LIQUIGEL OP) Place 1 drop into both eyes at bedtime.    Marland Kitchen diltiazem (TIAZAC) 360 MG 24 hr capsule Take 1 capsule by mouth daily.    . fluticasone (FLONASE) 50 MCG/ACT nasal spray Place 1 spray into the nose daily as needed for allergies. For allergies.    Marland Kitchen irbesartan-hydrochlorothiazide (AVALIDE)  300-12.5 MG tablet Take 0.5 tablets by mouth daily. 45 tablet 3  . levothyroxine (SYNTHROID, LEVOTHROID) 100 MCG tablet Take 100 mcg by mouth daily before breakfast.   1  . LORazepam (ATIVAN) 1 MG tablet Take 1 mg by mouth as needed for anxiety.    . Lutein-Zeaxanthin 25-5 MG CAPS Take 1 tablet by mouth daily. EYE VITAMIN    . warfarin (COUMADIN) 5 MG tablet Take 5 mg by mouth every Wednesday. Take on Wednesday.  2  . warfarin (COUMADIN) 7.5 MG tablet Take 7.5 mg by mouth See admin instructions. Dorene Grebe Tues Thurs Fri Sat     No facility-administered medications prior to visit.    PAST MEDICAL HISTORY: Past Medical History:  Diagnosis Date  . Anxiety    takes Lorazepam daily as needed  . Arthritis   . Cancer ([place])    thyroid  . Cataracts, bilateral    immature  . Chronic back pain    compressed vertebrae,takes Fosamax weekly  . Family history of adverse reaction to anesthesia    granddaughter gets very sick and mean  . History of blood transfusion    no abnormal reaction noted  . History of bronchitis 05/2015  . History of colon polyps    benign  . Hyperlipidemia    takes Zetia daily  . Hypertension    takes Diltiazem and Avapro daily  . Hypothyroidism    takes Synthroid daily  . Ischemic  colitis ([place])    hx of-many yrs ago  . Joint pain   . Joint swelling   . OSA on CPAP    05-08-12 AHi was 46, RDI  53. titrated to   9 cm water  with 3 cm flex.-nadir 75%  . PE (pulmonary embolism)    takes Coumadin daily  . Peripheral edema   . Pneumonia 1992   hx of  . Sarcoidosis   . Shortness of breath   . Urinary frequency   . Weakness    left leg.Numbness and tingling.Has sciatica  . Wheeze    occaionally. Not new    PAST SURGICAL HISTORY: Past Surgical History:  Procedure Laterality Date  . ABDOMINAL HYSTERECTOMY    . APPENDECTOMY    . BREAST BIOPSY     biopsies on right x 2  . BREAST CYST ASPIRATION Left   . CHOLECYSTECTOMY N/A 11/29/2015   Procedure:  LAPAROSCOPIC CHOLECYSTECTOMY;  Surgeon: [name], MD;  Location: [facility];  Service: General;  Laterality: N/A;  . COLONOSCOPY    . DILATION AND CURETTAGE OF UTERUS    . LIVER BIOPSY    . MELANOMA EXCISION     removed from left leg  . scalene surgery  1972   "trying to find out what was wrong with her lungs"  . THYROID SURGERY      FAMILY HISTORY: Family History  Problem Relation Age of Onset  . CVA Mother   . Heart attack Neg Hx     SOCIAL HISTORY: Social History   Socioeconomic History  . Marital status: Married    Spouse name: Not on file  . Number of children: Not on file  . Years of education: Not on file  . Highest education level: Not on file  Occupational History  . Occupation: Retired    Fish farm manager: RETIRED  Tobacco Use  . Smoking status: Never Smoker  . Smokeless tobacco: Never Used  Vaping Use  . Vaping Use: Never used  Substance and Sexual Activity  . Alcohol use: No  . Drug use: No  . Sexual activity: Not on file  Other Topics Concern  . Not on file  Social History Narrative  . Not on file   Social Determinants of Health   Financial Resource Strain: Not on file  Food Insecurity: Not on file  Transportation Needs: Not on file  Physical Activity: Not on file  Stress: Not on file  Social Connections: Not on file  Intimate Partner Violence: Not on file      PHYSICAL EXAM  Vitals:   08/06/20 0828  BP: 140/80  Pulse: 74  Weight: 250 lb (113.4 kg)  Height: 5\' 3"  (1.6 m)   Body mass index is 44.29 kg/m.  Generalized: Well developed, in no acute distress  Chest: Lungs clear to auscultation bilaterally  Neurological examination  Mentation: Alert oriented to time, place, history taking. Follows all commands speech and language fluent Cranial nerve II-XII: Extraocular movements were full, visual field were full on confrontational test Head turning and shoulder shrug  were normal and symmetric. Motor: The motor testing reveals 5 over 5  strength of all 4 extremities. Good symmetric motor tone is noted throughout.  Sensory: Sensory testing is intact to soft touch on all 4 extremities. No evidence of extinction is noted.  Gait and station: Gait is normal.    DIAGNOSTIC DATA (LABS, IMAGING, TESTING) - I reviewed patient records, labs, notes, testing and imaging myself where available.  Lab Results  Component Value Date   WBC 7.9 11/28/2015   HGB 14.4 11/28/2015   HCT 44.2 11/28/2015   MCV 88.6 11/28/2015   PLT 239 11/28/2015      Component Value Date/Time   NA 141 11/28/2015 0844   NA 140 11/29/2014 0839   K 3.9 11/28/2015 0844   K 4.4 11/29/2014 0839   CL 108 11/28/2015 0844   CL 104 04/29/2012 1145   CO2 26 11/28/2015 0844   CO2 28 11/29/2014 0839   GLUCOSE 97 11/28/2015 0844   GLUCOSE 121 11/29/2014 0839   GLUCOSE 93 04/29/2012 1145   BUN 12 11/28/2015 0844   BUN 13.5 11/29/2014 0839   CREATININE 0.79 11/28/2015 0844   CREATININE 0.8 11/29/2014 0839   CALCIUM 9.5 11/28/2015 0844   CALCIUM 9.1 11/29/2014 0839   PROT 7.4 11/29/2014 0839   ALBUMIN 3.4 (L) 11/29/2014 0839   AST 19 11/29/2014 0839   ALT 24 11/29/2014 0839   ALKPHOS 84 11/29/2014 0839   BILITOT 0.42 11/29/2014 0839   GFRNONAA >60 11/28/2015 0844   GFRAA >60 11/28/2015 0844    Lab Results  Component Value Date   TSH 2.500 05/24/2014      ASSESSMENT AND PLAN 80 y.o. year old female  has a past medical history of Anxiety, Arthritis, Cancer ([place]), Cataracts, bilateral, Chronic back pain, Family history of adverse reaction to anesthesia, History of blood transfusion, History of bronchitis (05/2015), History of colon polyps, Hyperlipidemia, Hypertension, Hypothyroidism, Ischemic colitis ([place]), Joint pain, Joint swelling, OSA on CPAP, PE (pulmonary embolism), Peripheral edema, Pneumonia (1992), Sarcoidosis, Shortness of breath, Urinary frequency, Weakness, and Wheeze. here with:  1. OSA on CPAP  - CPAP compliance excellent - Good  treatment of AHI  - Encourage patient to use CPAP nightly and > 4 hours each night -Patient will have a mask refitting completed by our sleep lab today (Meagan A. Completing) - F/U in 1 year or sooner if needed   I spent 20 minutes of face-to-face and non-face-to-face time with patient.  This included previsit chart review, lab review, study review, order entry, electronic health record documentation, patient education.  Ward Givens, MSN, NP-C 08/06/2020, 8:44 AM [facility] [address], [place], [place] [address] [phone]

## 2020-08-17 ENCOUNTER — Other Ambulatory Visit: Payer: Self-pay | Admitting: Internal Medicine

## 2020-08-17 DIAGNOSIS — Z0000 Encounter for general adult medical examination without abnormal findings: Secondary | ICD-10-CM

## 2020-08-29 DIAGNOSIS — Z86711 Personal history of pulmonary embolism: Secondary | ICD-10-CM | POA: Diagnosis not present

## 2020-08-29 DIAGNOSIS — I80209 Phlebitis and thrombophlebitis of unspecified deep vessels of unspecified lower extremity: Secondary | ICD-10-CM | POA: Diagnosis not present

## 2020-08-29 DIAGNOSIS — Z7901 Long term (current) use of anticoagulants: Secondary | ICD-10-CM | POA: Diagnosis not present

## 2020-10-03 DIAGNOSIS — Z86711 Personal history of pulmonary embolism: Secondary | ICD-10-CM | POA: Diagnosis not present

## 2020-10-03 DIAGNOSIS — Z7901 Long term (current) use of anticoagulants: Secondary | ICD-10-CM | POA: Diagnosis not present

## 2020-10-03 DIAGNOSIS — I80209 Phlebitis and thrombophlebitis of unspecified deep vessels of unspecified lower extremity: Secondary | ICD-10-CM | POA: Diagnosis not present

## 2020-10-03 DIAGNOSIS — G4733 Obstructive sleep apnea (adult) (pediatric): Secondary | ICD-10-CM | POA: Diagnosis not present

## 2020-10-05 IMAGING — CT CT ABD-PELV W/O CM
2 of 4 series · 14 of 46 positions shown, 16 images · non-contrast
Comparison: CT scan of November 05, 2016.

CLINICAL DATA: Acute right flank pain.

EXAM:
CT ABDOMEN AND PELVIS WITHOUT CONTRAST
TECHNIQUE: Multidetector CT imaging of the abdomen and pelvis was performed
following the standard protocol without IV contrast.

[Series 2: renal stone 5.00 br40 s3 ax · axial · 0.72mm/px · z∈[+1368,+1748]mm · 11 of 92 slices shown, 13 images]
[im 8/92  soft-tissue]
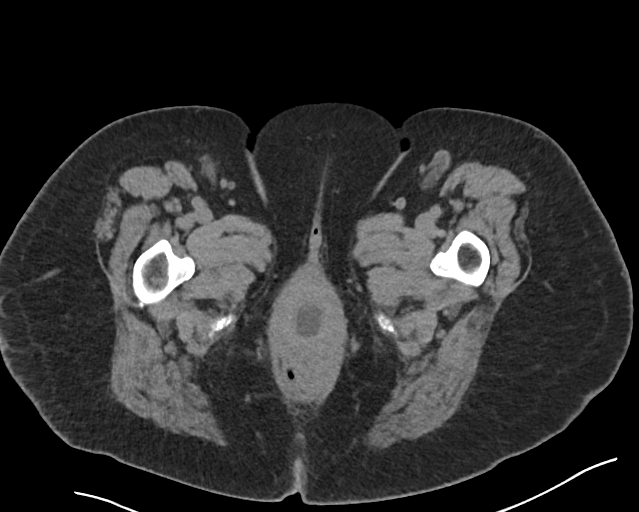
[im 8/92  bone]
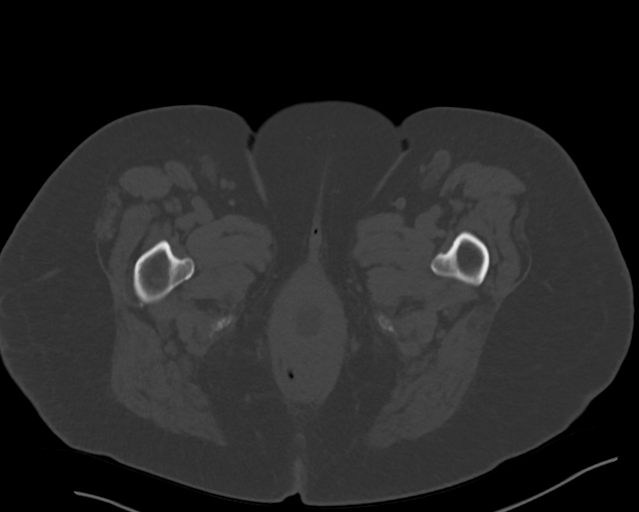
[im 15/92  soft-tissue]
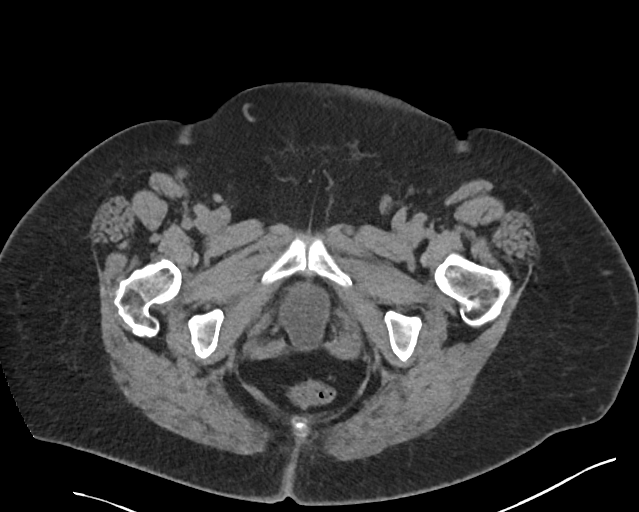
[im 22/92  soft-tissue]
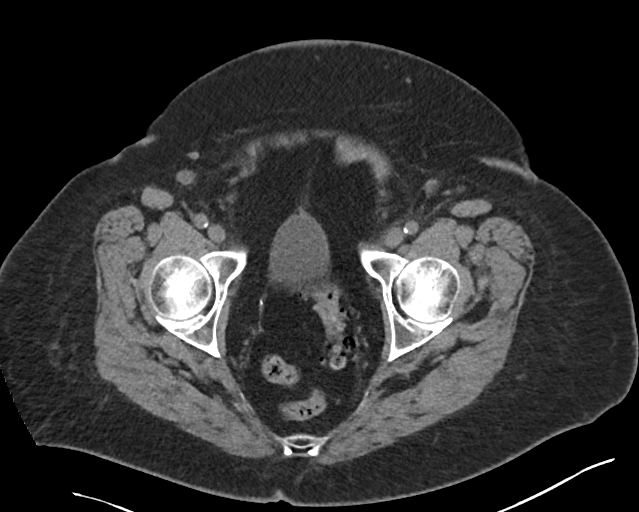
[im 30/92  soft-tissue]
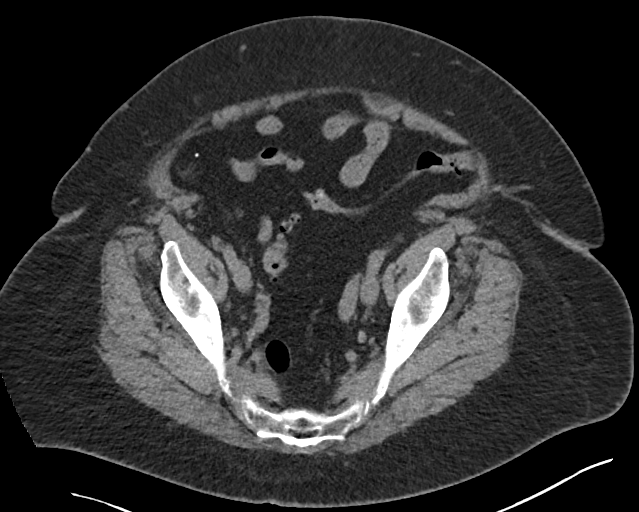
[im 37/92  soft-tissue]
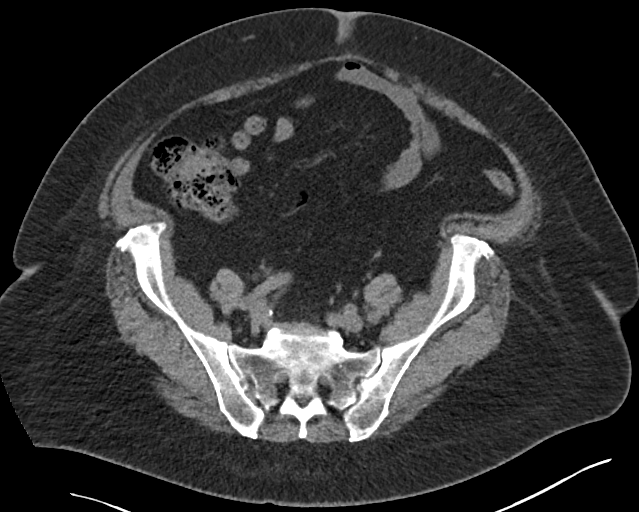
[im 48/92  soft-tissue]
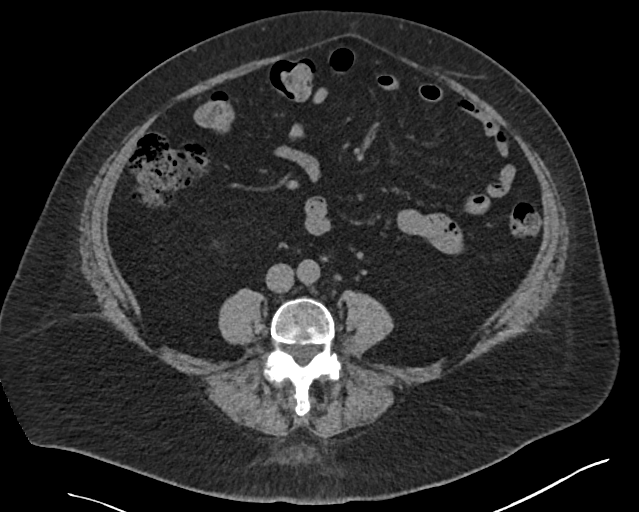
[im 55/92  soft-tissue]
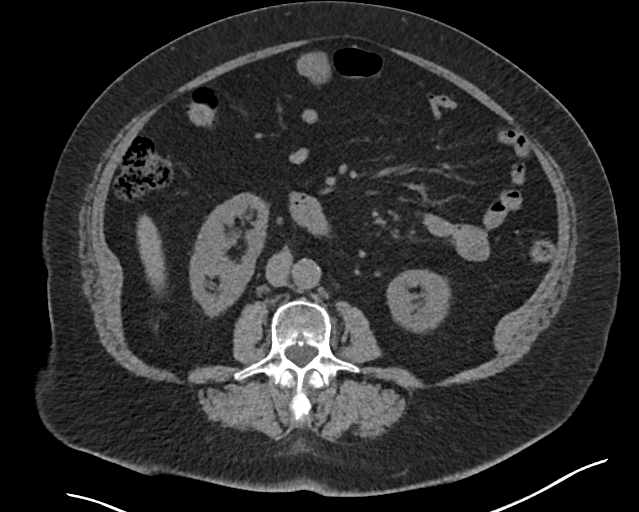
[im 62/92  soft-tissue]
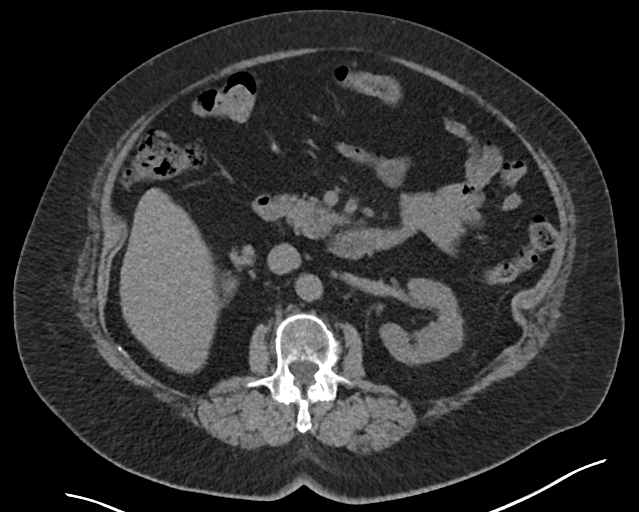
[im 70/92  soft-tissue]
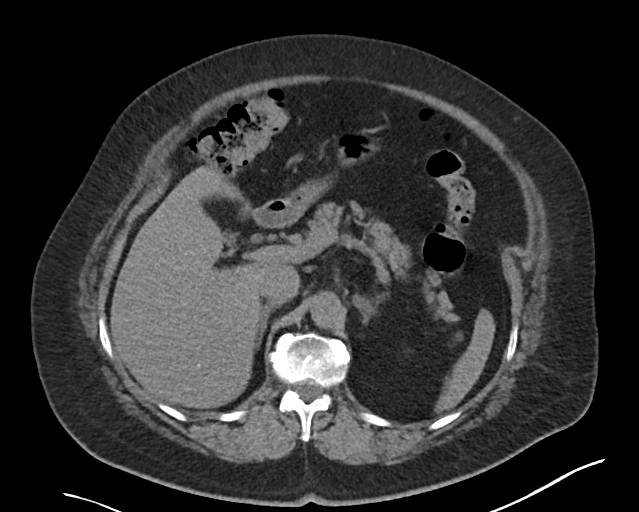
[im 70/92  bone]
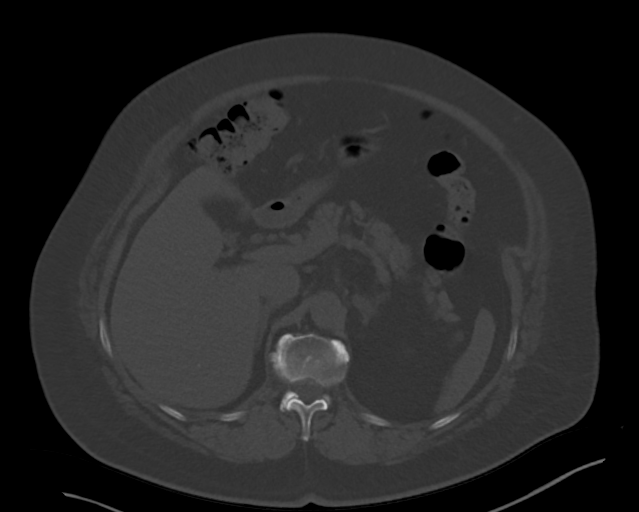
[im 77/92  soft-tissue]
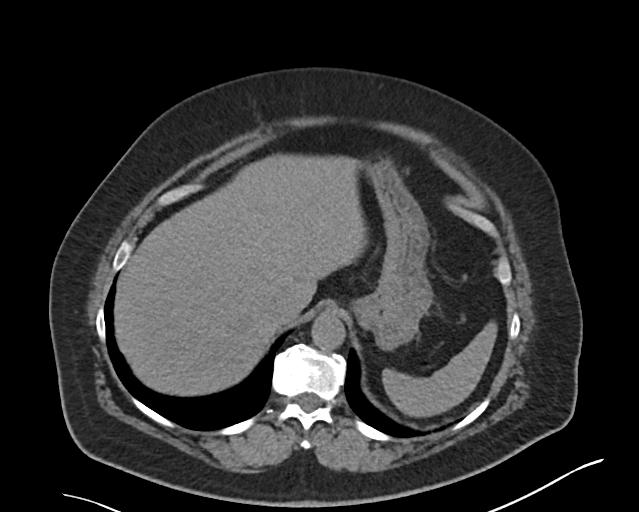
[im 84/92  soft-tissue]
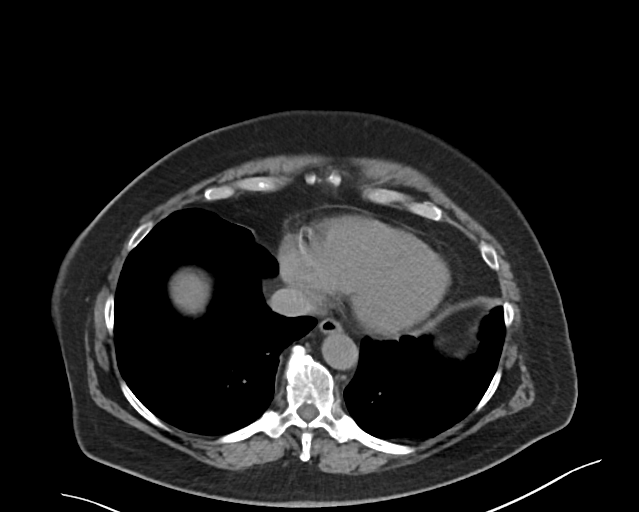

[Series 6: renal stone 2.00 br40 s3 cor · coronal · 0.90mm/px · 3 of 184 slices shown]
[im 62/184  soft-tissue]
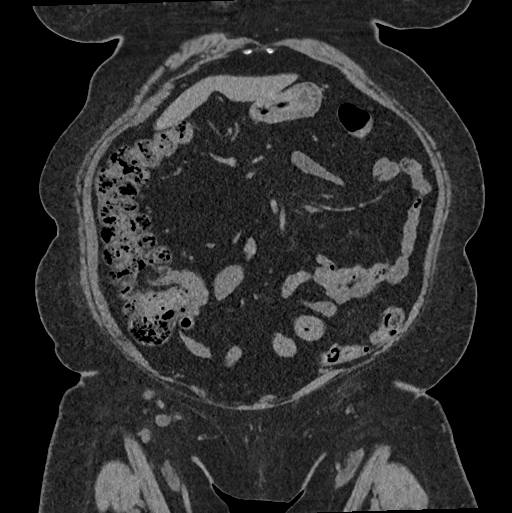
[im 82/184  soft-tissue]
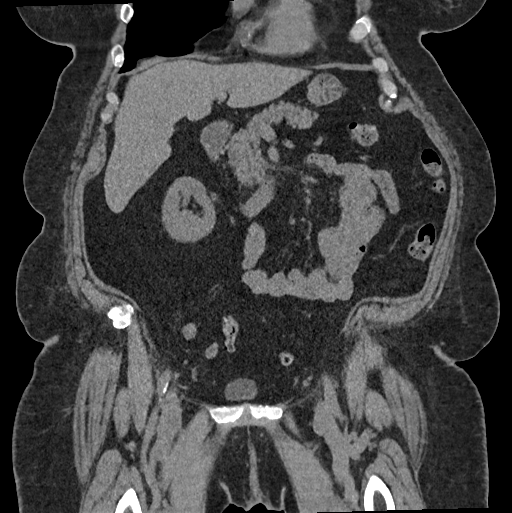
[im 102/184  soft-tissue]
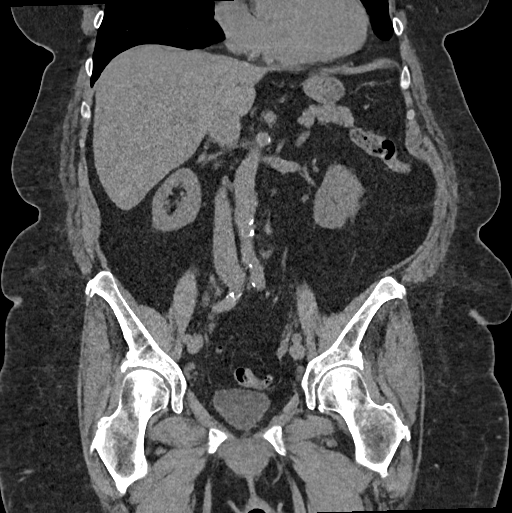

[14 of 46 positions shown; findings below may reference images not displayed]

FINDINGS: Lower chest: No acute abnormality.

Hepatobiliary: No focal liver abnormality is seen. Status post
cholecystectomy. No biliary dilatation.

Pancreas: Unremarkable. No pancreatic ductal dilatation or
surrounding inflammatory changes.

Spleen: Normal in size without focal abnormality.

Adrenals/Urinary Tract: Adrenal glands are unremarkable. Kidneys are
normal, without renal calculi, focal lesion, or hydronephrosis.
Bladder is unremarkable.

Stomach/Bowel: The stomach appears normal. There is no evidence of
bowel obstruction or inflammation. Status post appendectomy. Sigmoid
diverticulosis is noted without inflammation.

Vascular/Lymphatic: Aortic atherosclerosis. No enlarged abdominal or
pelvic lymph nodes.

Reproductive: Status post hysterectomy. No adnexal masses.

Other: Small fat containing periumbilical hernia is noted. No
ascites is noted.

Musculoskeletal: No acute or significant osseous findings.
IMPRESSION: Sigmoid diverticulosis without inflammation.

Small fat containing periumbilical hernia.

No acute abnormality seen in the abdomen or pelvis.

Aortic Atherosclerosis ([id number].V).

## 2020-10-09 ENCOUNTER — Other Ambulatory Visit: Payer: Self-pay

## 2020-10-09 ENCOUNTER — Ambulatory Visit
Admission: RE | Admit: 2020-10-09 | Discharge: 2020-10-09 | Disposition: A | Discharge status: Home / Self Care | Payer: Medicare HMO | Source: Ambulatory Visit | Attending: Internal Medicine | Admitting: Internal Medicine

## 2020-10-09 DIAGNOSIS — Z1231 Encounter for screening mammogram for malignant neoplasm of breast: Secondary | ICD-10-CM | POA: Diagnosis not present

## 2020-10-09 DIAGNOSIS — Z0000 Encounter for general adult medical examination without abnormal findings: Secondary | ICD-10-CM

## 2020-11-07 DIAGNOSIS — Z86711 Personal history of pulmonary embolism: Secondary | ICD-10-CM | POA: Diagnosis not present

## 2020-11-07 DIAGNOSIS — Z7901 Long term (current) use of anticoagulants: Secondary | ICD-10-CM | POA: Diagnosis not present

## 2020-11-07 DIAGNOSIS — I80209 Phlebitis and thrombophlebitis of unspecified deep vessels of unspecified lower extremity: Secondary | ICD-10-CM | POA: Diagnosis not present

## 2020-11-15 DIAGNOSIS — H35373 Puckering of macula, bilateral: Secondary | ICD-10-CM | POA: Diagnosis not present

## 2020-11-15 DIAGNOSIS — H4321 Crystalline deposits in vitreous body, right eye: Secondary | ICD-10-CM | POA: Diagnosis not present

## 2020-11-15 DIAGNOSIS — H524 Presbyopia: Secondary | ICD-10-CM | POA: Diagnosis not present

## 2020-11-15 DIAGNOSIS — Z961 Presence of intraocular lens: Secondary | ICD-10-CM | POA: Diagnosis not present

## 2020-11-15 DIAGNOSIS — H35033 Hypertensive retinopathy, bilateral: Secondary | ICD-10-CM | POA: Diagnosis not present

## 2020-11-15 DIAGNOSIS — H353132 Nonexudative age-related macular degeneration, bilateral, intermediate dry stage: Secondary | ICD-10-CM | POA: Diagnosis not present

## 2020-11-22 ENCOUNTER — Encounter (INDEPENDENT_AMBULATORY_CARE_PROVIDER_SITE_OTHER): Payer: Medicare HMO | Admitting: Ophthalmology

## 2020-11-23 ENCOUNTER — Other Ambulatory Visit: Payer: Self-pay

## 2020-11-23 ENCOUNTER — Ambulatory Visit: Payer: Medicare HMO | Admitting: Orthopaedic Surgery

## 2020-11-23 ENCOUNTER — Encounter: Payer: Self-pay | Admitting: Orthopaedic Surgery

## 2020-11-23 DIAGNOSIS — M65331 Trigger finger, right middle finger: Secondary | ICD-10-CM

## 2020-11-23 MED ORDER — METHYLPREDNISOLONE ACETATE 40 MG/ML IJ SUSP
13.3300 mg | INTRAMUSCULAR | Status: AC | PRN
  Administered 2020-11-23: 13.33 mg

## 2020-11-23 MED ORDER — LIDOCAINE HCL 1 % IJ SOLN
1.0000 mL | INTRAMUSCULAR | Status: AC | PRN
  Administered 2020-11-23: 1 mL

## 2020-11-23 MED ORDER — BUPIVACAINE HCL 0.25 % IJ SOLN
0.3300 mL | INTRAMUSCULAR | Status: AC | PRN
  Administered 2020-11-23: .33 mL

## 2020-11-23 NOTE — Progress Notes (Signed)
Office Visit Note   Patient: [name]           Date of Birth: [birth year]           MRN: [id number] Visit Date: 11/23/2020              Requested by: Leanna Battles, MD [place],  [place] [address] PCP: Leanna Battles, MD   Assessment & Plan: Visit Diagnoses:  1. Trigger finger, right middle finger     Plan: Impression is right long trigger finger.  We have discussed trigger finger injection with cortisone in addition to nighttime splinting for a week.  She would like to proceed with both.  Follow-up with [place] as needed.  Follow-Up Instructions: No follow-ups on file.   Orders:  Orders Placed This Encounter  Procedures   Hand/UE Inj: R long A1    No orders of the defined types were placed in this encounter.     Procedures: Hand/UE Inj: R long A1 for trigger finger on 11/23/2020 10:07 AM Indications: pain Details: 25 G needle Medications: 1 mL lidocaine 1 %; 0.33 mL bupivacaine 0.25 %; 13.33 mg methylPREDNISolone acetate 40 MG/ML     Clinical Data: No additional findings.   Subjective: Chief Complaint  Patient presents with   Right Middle Finger - Pain    HPI patient is a pleasant 80-year-old right-hand-dominant female who comes in today with pain and triggering to the right long finger for the past 2 months.  No known injury or change in activity.  She does a homemaker and bakes quite often.  The pain she has is primarily over the A1 pulley with associated triggering worse in the morning.  No previous trigger fingers.  Of note she is not a diabetic.  Review of Systems as detailed in HPI.  All others reviewed and are negative.   Objective: Vital Signs: There were no vitals taken for this visit.  Physical Exam well-developed well-nourished female in no acute distress.  Alert and oriented x3.  Ortho Exam right long finger exam shows a moderately tender and palpable nodule at the A1 pulley.  She also has a small nodule at the volar  aspect of the MCP joint.  She is exhibiting reproducible triggering.  Fingers are warm well perfused.  She is neurovascular intact distally.  Specialty Comments:  No specialty comments available.  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   Sarcoidosis of lung ([place]) 05/24/2014   Hypoxemia 05/24/2014   Essential hypertension, benign 10/13/2013   Nocturnal hypoxia 05/19/2013   Obstructive sleep apnea (adult) (pediatric) 05/19/2013   Obesity 05/19/2013   OSA on CPAP    Adenopathy 05/26/2012   Cough 05/20/2012   OSA (obstructive sleep apnea) 05/20/2012   Pulmonary embolism ([place]) 04/29/2012   DVT (deep venous thrombosis) ([place]) 04/29/2012   Past Medical History:  Diagnosis Date   Anxiety    takes Lorazepam daily as needed   Arthritis    Cancer ([place])    thyroid   Cataracts, bilateral    immature   Chronic back pain    compressed vertebrae,takes Fosamax weekly   Family history of adverse reaction to anesthesia    granddaughter gets very sick and mean   History of blood transfusion  no abnormal reaction noted   History of bronchitis 05/2015   History of colon polyps    benign   Hyperlipidemia    takes Zetia daily   Hypertension    takes Diltiazem and Avapro daily   Hypothyroidism    takes Synthroid daily   Ischemic colitis ([place])    hx of-many yrs ago   Joint pain    Joint swelling    OSA on CPAP    05-08-12 AHi was 46, RDI  53. titrated to   9 cm water  with 3 cm flex.-nadir 75%   PE (pulmonary embolism)    takes Coumadin daily   Peripheral edema    Pneumonia 1992   hx of   Sarcoidosis    Shortness of breath    Urinary frequency    Weakness    left leg.Numbness and tingling.Has sciatica   Wheeze    occaionally. Not new    Family History   Problem Relation Age of Onset   CVA Mother    Heart attack Neg Hx     Past Surgical History:  Procedure Laterality Date   ABDOMINAL HYSTERECTOMY     APPENDECTOMY     BREAST BIOPSY     biopsies on right x 2   BREAST CYST ASPIRATION Left    CHOLECYSTECTOMY N/A 11/29/2015   Procedure: LAPAROSCOPIC CHOLECYSTECTOMY;  Surgeon: [name], MD;  Location: [facility];  Service: General;  Laterality: N/A;   COLONOSCOPY     DILATION AND CURETTAGE OF UTERUS     LIVER BIOPSY     MELANOMA EXCISION     removed from left leg   scalene surgery  1972   "trying to find out what was wrong with her lungs"   THYROID SURGERY     Social History   Occupational History   Occupation: Retired    Fish farm manager: RETIRED  Tobacco Use   Smoking status: Never   Smokeless tobacco: Never  Vaping Use   Vaping Use: Never used  Substance and Sexual Activity   Alcohol use: No   Drug use: No   Sexual activity: Not on file

## 2020-12-12 DIAGNOSIS — Z7901 Long term (current) use of anticoagulants: Secondary | ICD-10-CM | POA: Diagnosis not present

## 2020-12-12 DIAGNOSIS — Z86711 Personal history of pulmonary embolism: Secondary | ICD-10-CM | POA: Diagnosis not present

## 2020-12-12 DIAGNOSIS — I80209 Phlebitis and thrombophlebitis of unspecified deep vessels of unspecified lower extremity: Secondary | ICD-10-CM | POA: Diagnosis not present

## 2021-01-02 DIAGNOSIS — G4733 Obstructive sleep apnea (adult) (pediatric): Secondary | ICD-10-CM | POA: Diagnosis not present

## 2021-01-16 DIAGNOSIS — Z7901 Long term (current) use of anticoagulants: Secondary | ICD-10-CM | POA: Diagnosis not present

## 2021-01-16 DIAGNOSIS — I80209 Phlebitis and thrombophlebitis of unspecified deep vessels of unspecified lower extremity: Secondary | ICD-10-CM | POA: Diagnosis not present

## 2021-01-23 ENCOUNTER — Other Ambulatory Visit: Payer: Self-pay

## 2021-01-23 ENCOUNTER — Encounter (INDEPENDENT_AMBULATORY_CARE_PROVIDER_SITE_OTHER): Payer: Medicare HMO | Admitting: Ophthalmology

## 2021-01-23 DIAGNOSIS — H43813 Vitreous degeneration, bilateral: Secondary | ICD-10-CM | POA: Diagnosis not present

## 2021-01-23 DIAGNOSIS — H35033 Hypertensive retinopathy, bilateral: Secondary | ICD-10-CM | POA: Diagnosis not present

## 2021-01-23 DIAGNOSIS — I10 Essential (primary) hypertension: Secondary | ICD-10-CM | POA: Diagnosis not present

## 2021-01-23 DIAGNOSIS — H33301 Unspecified retinal break, right eye: Secondary | ICD-10-CM | POA: Diagnosis not present

## 2021-01-23 DIAGNOSIS — H353132 Nonexudative age-related macular degeneration, bilateral, intermediate dry stage: Secondary | ICD-10-CM | POA: Diagnosis not present

## 2021-02-02 DIAGNOSIS — G4733 Obstructive sleep apnea (adult) (pediatric): Secondary | ICD-10-CM | POA: Diagnosis not present

## 2021-02-20 DIAGNOSIS — Z86711 Personal history of pulmonary embolism: Secondary | ICD-10-CM | POA: Diagnosis not present

## 2021-02-20 DIAGNOSIS — Z7901 Long term (current) use of anticoagulants: Secondary | ICD-10-CM | POA: Diagnosis not present

## 2021-02-20 DIAGNOSIS — Z23 Encounter for immunization: Secondary | ICD-10-CM | POA: Diagnosis not present

## 2021-02-20 DIAGNOSIS — I80209 Phlebitis and thrombophlebitis of unspecified deep vessels of unspecified lower extremity: Secondary | ICD-10-CM | POA: Diagnosis not present

## 2021-03-05 DIAGNOSIS — G4733 Obstructive sleep apnea (adult) (pediatric): Secondary | ICD-10-CM | POA: Diagnosis not present

## 2021-03-08 DIAGNOSIS — E559 Vitamin D deficiency, unspecified: Secondary | ICD-10-CM | POA: Diagnosis not present

## 2021-03-08 DIAGNOSIS — R82998 Other abnormal findings in urine: Secondary | ICD-10-CM | POA: Diagnosis not present

## 2021-03-08 DIAGNOSIS — E890 Postprocedural hypothyroidism: Secondary | ICD-10-CM | POA: Diagnosis not present

## 2021-03-08 DIAGNOSIS — E041 Nontoxic single thyroid nodule: Secondary | ICD-10-CM | POA: Diagnosis not present

## 2021-03-08 DIAGNOSIS — I10 Essential (primary) hypertension: Secondary | ICD-10-CM | POA: Diagnosis not present

## 2021-03-08 DIAGNOSIS — E782 Mixed hyperlipidemia: Secondary | ICD-10-CM | POA: Diagnosis not present

## 2021-03-15 DIAGNOSIS — I10 Essential (primary) hypertension: Secondary | ICD-10-CM | POA: Diagnosis not present

## 2021-03-15 DIAGNOSIS — E890 Postprocedural hypothyroidism: Secondary | ICD-10-CM | POA: Diagnosis not present

## 2021-03-15 DIAGNOSIS — M25561 Pain in right knee: Secondary | ICD-10-CM | POA: Diagnosis not present

## 2021-03-15 DIAGNOSIS — I739 Peripheral vascular disease, unspecified: Secondary | ICD-10-CM | POA: Diagnosis not present

## 2021-03-15 DIAGNOSIS — E782 Mixed hyperlipidemia: Secondary | ICD-10-CM | POA: Diagnosis not present

## 2021-03-15 DIAGNOSIS — Z0000 Encounter for general adult medical examination without abnormal findings: Secondary | ICD-10-CM | POA: Diagnosis not present

## 2021-03-15 DIAGNOSIS — G4733 Obstructive sleep apnea (adult) (pediatric): Secondary | ICD-10-CM | POA: Diagnosis not present

## 2021-03-15 DIAGNOSIS — E559 Vitamin D deficiency, unspecified: Secondary | ICD-10-CM | POA: Diagnosis not present

## 2021-03-15 DIAGNOSIS — I872 Venous insufficiency (chronic) (peripheral): Secondary | ICD-10-CM | POA: Diagnosis not present

## 2021-03-22 DIAGNOSIS — I83029 Varicose veins of left lower extremity with ulcer of unspecified site: Secondary | ICD-10-CM | POA: Diagnosis not present

## 2021-03-22 DIAGNOSIS — I83019 Varicose veins of right lower extremity with ulcer of unspecified site: Secondary | ICD-10-CM | POA: Diagnosis not present

## 2021-03-22 DIAGNOSIS — L03119 Cellulitis of unspecified part of limb: Secondary | ICD-10-CM | POA: Diagnosis not present

## 2021-03-22 DIAGNOSIS — L97919 Non-pressure chronic ulcer of unspecified part of right lower leg with unspecified severity: Secondary | ICD-10-CM | POA: Diagnosis not present

## 2021-03-22 DIAGNOSIS — I872 Venous insufficiency (chronic) (peripheral): Secondary | ICD-10-CM | POA: Diagnosis not present

## 2021-03-22 DIAGNOSIS — L600 Ingrowing nail: Secondary | ICD-10-CM | POA: Diagnosis not present

## 2021-03-22 DIAGNOSIS — L97929 Non-pressure chronic ulcer of unspecified part of left lower leg with unspecified severity: Secondary | ICD-10-CM | POA: Diagnosis not present

## 2021-03-29 DIAGNOSIS — L97919 Non-pressure chronic ulcer of unspecified part of right lower leg with unspecified severity: Secondary | ICD-10-CM | POA: Diagnosis not present

## 2021-03-29 DIAGNOSIS — Z7901 Long term (current) use of anticoagulants: Secondary | ICD-10-CM | POA: Diagnosis not present

## 2021-03-29 DIAGNOSIS — L03119 Cellulitis of unspecified part of limb: Secondary | ICD-10-CM | POA: Diagnosis not present

## 2021-03-29 DIAGNOSIS — I83019 Varicose veins of right lower extremity with ulcer of unspecified site: Secondary | ICD-10-CM | POA: Diagnosis not present

## 2021-03-29 DIAGNOSIS — Z86711 Personal history of pulmonary embolism: Secondary | ICD-10-CM | POA: Diagnosis not present

## 2021-03-29 DIAGNOSIS — I83029 Varicose veins of left lower extremity with ulcer of unspecified site: Secondary | ICD-10-CM | POA: Diagnosis not present

## 2021-03-29 DIAGNOSIS — B372 Candidiasis of skin and nail: Secondary | ICD-10-CM | POA: Diagnosis not present

## 2021-03-29 DIAGNOSIS — L97929 Non-pressure chronic ulcer of unspecified part of left lower leg with unspecified severity: Secondary | ICD-10-CM | POA: Diagnosis not present

## 2021-03-29 DIAGNOSIS — L600 Ingrowing nail: Secondary | ICD-10-CM | POA: Diagnosis not present

## 2021-03-29 DIAGNOSIS — I872 Venous insufficiency (chronic) (peripheral): Secondary | ICD-10-CM | POA: Diagnosis not present

## 2021-04-02 DIAGNOSIS — G4733 Obstructive sleep apnea (adult) (pediatric): Secondary | ICD-10-CM | POA: Diagnosis not present

## 2021-04-04 ENCOUNTER — Other Ambulatory Visit: Payer: Self-pay

## 2021-04-04 ENCOUNTER — Ambulatory Visit: Payer: Medicare HMO | Admitting: Podiatry

## 2021-04-04 VITALS — BP 122/89 | HR 96 | Temp 97.8°F

## 2021-04-04 DIAGNOSIS — M79675 Pain in left toe(s): Secondary | ICD-10-CM | POA: Diagnosis not present

## 2021-04-04 DIAGNOSIS — L600 Ingrowing nail: Secondary | ICD-10-CM | POA: Diagnosis not present

## 2021-04-04 DIAGNOSIS — M79674 Pain in right toe(s): Secondary | ICD-10-CM

## 2021-04-04 MED ORDER — CEPHALEXIN 500 MG PO CAPS: 500.0000 mg | ORAL_CAPSULE | Freq: Three times a day (TID) | ORAL | 0 refills | Status: DC

## 2021-04-04 NOTE — Patient Instructions (Signed)

## 2021-04-05 DIAGNOSIS — Z7901 Long term (current) use of anticoagulants: Secondary | ICD-10-CM | POA: Diagnosis not present

## 2021-04-05 DIAGNOSIS — L97929 Non-pressure chronic ulcer of unspecified part of left lower leg with unspecified severity: Secondary | ICD-10-CM | POA: Diagnosis not present

## 2021-04-05 DIAGNOSIS — L600 Ingrowing nail: Secondary | ICD-10-CM | POA: Diagnosis not present

## 2021-04-05 DIAGNOSIS — I83029 Varicose veins of left lower extremity with ulcer of unspecified site: Secondary | ICD-10-CM | POA: Diagnosis not present

## 2021-04-05 DIAGNOSIS — I872 Venous insufficiency (chronic) (peripheral): Secondary | ICD-10-CM | POA: Diagnosis not present

## 2021-04-07 NOTE — Progress Notes (Signed)
Subjective:   Patient ID: [name], female   DOB: 80 y.o.   MRN: [id number]   HPI 80 year old female presents the office today for concerns of reoccurrence of infection in her toenails mostly right big toenail.  She says ingrown in the nail crumbles causing discomfort, infection.  She is also gets similar symptoms a left big toe, left second toenail.  Currently denies any pus in the left foot.  Main symptoms today on the right big toe.  She is on Coumadin but she did not take it yesterday she states.   Review of Systems  All other systems reviewed and are negative.  Past Medical History:  Diagnosis Date   Anxiety    takes Lorazepam daily as needed   Arthritis    Cancer ([place])    thyroid   Cataracts, bilateral    immature   Chronic back pain    compressed vertebrae,takes Fosamax weekly   Family history of adverse reaction to anesthesia    granddaughter gets very sick and mean   History of blood transfusion    no abnormal reaction noted   History of bronchitis 05/2015   History of colon polyps    benign   Hyperlipidemia    takes Zetia daily   Hypertension    takes Diltiazem and Avapro daily   Hypothyroidism    takes Synthroid daily   Ischemic colitis ([place])    hx of-many yrs ago   Joint pain    Joint swelling    OSA on CPAP    05-08-12 AHi was 46, RDI  53. titrated to   9 cm water  with 3 cm flex.-nadir 75%   PE (pulmonary embolism)    takes Coumadin daily   Peripheral edema    Pneumonia 1992   hx of   Sarcoidosis    Shortness of breath    Urinary frequency    Weakness    left leg.Numbness and tingling.Has sciatica   Wheeze    occaionally. Not new    Past Surgical History:  Procedure Laterality Date   ABDOMINAL HYSTERECTOMY     APPENDECTOMY     BREAST BIOPSY     biopsies on right x 2   BREAST CYST ASPIRATION Left    CHOLECYSTECTOMY N/A 11/29/2015   Procedure: LAPAROSCOPIC CHOLECYSTECTOMY;  Surgeon: [name], MD;  Location: [place];  Service:  General;  Laterality: N/A;   COLONOSCOPY     DILATION AND CURETTAGE OF UTERUS     LIVER BIOPSY     MELANOMA EXCISION     removed from left leg   scalene surgery  1972   "trying to find out what was wrong with her lungs"   THYROID SURGERY       Current Outpatient Medications:    calcium carbonate (TUMS) 500 MG chewable tablet, as needed., Disp: , Rfl:    cephALEXin (KEFLEX) 500 MG capsule, Take 1 capsule (500 mg total) by mouth 3 (three) times daily., Disp: 21 capsule, Rfl: 0   ipratropium (ATROVENT) 0.03 % nasal spray, take 1-2 sprays into each nostril up to 3 times daily as needed for ccongestion, Disp: , Rfl:    Omeprazole Magnesium (ACID REDUCER) 20.6 (20 Base) MG CPDR, Take 1 capsule by mouth at bedtime., Disp: , Rfl:    alendronate (FOSAMAX) 70 MG tablet, Take 70 mg by mouth once a week. , Disp: , Rfl:    Carboxymethylcellulose Sodium (REFRESH LIQUIGEL OP), Place 1 drop into both eyes at bedtime., Disp: ,  Rfl:    diltiazem (TIAZAC) 360 MG 24 hr capsule, Take 1 capsule by mouth daily., Disp: , Rfl:    fluticasone (FLONASE) 50 MCG/ACT nasal spray, Place 1 spray into the nose daily as needed for allergies. For allergies., Disp: , Rfl:    irbesartan-hydrochlorothiazide (AVALIDE) 300-12.5 MG tablet, Take 0.5 tablets by mouth daily., Disp: 45 tablet, Rfl: 3   levothyroxine (SYNTHROID, LEVOTHROID) 100 MCG tablet, Take 100 mcg by mouth daily before breakfast. , Disp: , Rfl: 1   LORazepam (ATIVAN) 1 MG tablet, Take 1 mg by mouth as needed for anxiety., Disp: , Rfl:    Lutein-Zeaxanthin 25-5 MG CAPS, Take 1 tablet by mouth daily. EYE VITAMIN, Disp: , Rfl:    mupirocin ointment (BACTROBAN) 2 %, Apply topically 2 (two) times daily., Disp: , Rfl:    warfarin (COUMADIN) 5 MG tablet, Take 5 mg by mouth every Wednesday. Take on Wednesday., Disp: , Rfl: 2   warfarin (COUMADIN) 7.5 MG tablet, Take 7.5 mg by mouth See admin instructions. Nancy Fetter Mon Tues Thurs Fri Sat, Disp: , Rfl:   Allergies  Allergen  Reactions   Codeine Other (See Comments)    Causes hyperactivity   Erythromycin Itching and Other (See Comments)    Also with jitters   Tetracyclines & Related Itching    Also with jitters   Statins Other (See Comments)    Leg muscle weakness   Latex Rash          Objective:  Physical Exam  General: AAO x3, NAD  Dermatological: Right hallux toenail with incurvation both medial lateral aspects and there is granulation tissue present on nail borders and in general nails hypertrophic, dystrophic, discolored.  There is tenderness to the entire toenail right hallux.  Started to get similar symptoms on the left side hallux toenail but not as severe as the right side.  Also incurvation along the nail border of the left second toe lateral aspect localized edema and faint erythema.  There is no drainage or pus.  There is no open lesions otherwise bilaterally.  Vascular: Dorsalis Pedis artery and Posterior Tibial artery pedal pulses are 2/4 bilateral with immedate capillary fill time.  There is no pain with calf compression, swelling, warmth, erythema.   Neruologic: Grossly intact via light touch bilateral.   Musculoskeletal: No gross boney pedal deformities bilateral. No pain, crepitus, or limitation noted with foot and ankle range of motion bilateral. Muscular strength 5/5 in all groups tested bilateral.  Gait: Unassisted, Nonantalgic.       Assessment:   Ingrown toenail, reoccurrence infection right hallux toenail worse than left     Plan:  -Treatment options discussed including all alternatives, risks, and complications -Etiology of symptoms were discussed -At this time, the patient is requesting partial nail removal with chemical matricectomy to the symptomatic portion of the nail. Risks and complications were discussed with the patient for which they understand and written consent was obtained. Under sterile conditions a total of 3 mL of a mixture of 2% lidocaine plain and 0.5%  Marcaine plain was infiltrated in a hallux block fashion. Once anesthetized, the skin was prepped in sterile fashion. A tourniquet was then applied. Next the right hallux nail border was removed in total. Once the nail was ensured to be removed area was debrided and the underlying skin was intact. There is no purulence identified in the procedure. Next phenol was then applied under standard conditions and copiously irrigated. Silvadene was applied. A dry sterile dressing was applied.  After application of the dressing the tourniquet was removed and there is found to be an immediate capillary refill time to the digit. The patient tolerated the procedure well any complications. Post procedure instructions were discussed the patient for which he verbally understood. Follow-up in one week for nail check or sooner if any problems are to arise. Discussed signs/symptoms of infection and directed to call the office immediately should any occur or go directly to the emergency room. In the meantime, encouraged to call the office with any questions, concerns, changes symptoms. -Possible left toenail avulsion and partial nail avulsion of the left second toenail if needed next appointment. -Keflex  [name]

## 2021-04-12 DIAGNOSIS — I872 Venous insufficiency (chronic) (peripheral): Secondary | ICD-10-CM | POA: Diagnosis not present

## 2021-04-12 DIAGNOSIS — I83019 Varicose veins of right lower extremity with ulcer of unspecified site: Secondary | ICD-10-CM | POA: Diagnosis not present

## 2021-04-12 DIAGNOSIS — L97929 Non-pressure chronic ulcer of unspecified part of left lower leg with unspecified severity: Secondary | ICD-10-CM | POA: Diagnosis not present

## 2021-04-12 DIAGNOSIS — I83029 Varicose veins of left lower extremity with ulcer of unspecified site: Secondary | ICD-10-CM | POA: Diagnosis not present

## 2021-04-12 DIAGNOSIS — Z7901 Long term (current) use of anticoagulants: Secondary | ICD-10-CM | POA: Diagnosis not present

## 2021-04-12 DIAGNOSIS — L600 Ingrowing nail: Secondary | ICD-10-CM | POA: Diagnosis not present

## 2021-04-18 ENCOUNTER — Ambulatory Visit: Payer: Medicare HMO | Admitting: Podiatry

## 2021-04-18 ENCOUNTER — Other Ambulatory Visit: Payer: Self-pay

## 2021-04-18 DIAGNOSIS — L600 Ingrowing nail: Secondary | ICD-10-CM

## 2021-04-18 DIAGNOSIS — M79674 Pain in right toe(s): Secondary | ICD-10-CM

## 2021-04-18 DIAGNOSIS — M79675 Pain in left toe(s): Secondary | ICD-10-CM

## 2021-04-21 NOTE — Progress Notes (Signed)
Subjective: 80 year old female presents the office today for follow evaluation after having her right big toenail removed.  She is concerned that there may still be a piece of nail present that she has been getting on herself.  She is been soaking in Epson salts, antibiotic ointment and a bandage.  She also keeps a Band-Aid on the left big toe as it gets tender as it gets ingrown.  No swelling or redness of the toenail sites on either side.  No fevers or chills.  She has no other concerns.  Objective: AAO x3, NAD DP/PT pulses palpable bilaterally, CRT less than 3 seconds Status post right toenail removal.  Granulation tissue still present the nailbed with smaller macerated tissue.  I am not able to identify any toenail today.  She kept insisting there was a small piece of nail but I was not able to identify this.  I think what she is seeing is some skin buildup which I did debride today.  There is no drainage or pus noted.  No erythema, ascending cellulitis.  No signs of infection.  Incurvation of the nails on the left first and second toenail with slight discomfort but there is no edema, erythema or signs of infection. No pain with calf compression, swelling, warmth, erythema  Assessment: Status post right hallux toenail avulsion, ingrown toenails left hallux, second digit  Plan: -All treatment options discussed with the patient including all alternatives, risks, complications.  -Unable to identify any remaining toenail today.  There was some skin buildup but I did debride the any complications or bleeding.  I recommended continue soaking with Epson salts daily covering with a small amount of antibiotic ointment and a bandage.  She can leave the area open at night or dry bandage.  We will hold off on the left side today's is no signs of infection and like for the right side to completely heal before proceeding with the left side.  Monitor for any signs or symptoms of infection. -Patient encouraged to  call the office with any questions, concerns, change in symptoms.   [name]

## 2021-05-03 DIAGNOSIS — G4733 Obstructive sleep apnea (adult) (pediatric): Secondary | ICD-10-CM | POA: Diagnosis not present

## 2021-05-07 ENCOUNTER — Other Ambulatory Visit: Payer: Self-pay

## 2021-05-07 ENCOUNTER — Emergency Department (HOSPITAL_BASED_OUTPATIENT_CLINIC_OR_DEPARTMENT_OTHER): Payer: Medicare HMO

## 2021-05-07 ENCOUNTER — Emergency Department (HOSPITAL_BASED_OUTPATIENT_CLINIC_OR_DEPARTMENT_OTHER)
Admission: EM | Admit: 2021-05-07 | Discharge: 2021-05-07 | Disposition: A | Discharge status: Home / Self Care | Payer: Medicare HMO | Attending: Emergency Medicine | Admitting: Emergency Medicine

## 2021-05-07 ENCOUNTER — Encounter (HOSPITAL_BASED_OUTPATIENT_CLINIC_OR_DEPARTMENT_OTHER): Payer: Self-pay | Admitting: Emergency Medicine

## 2021-05-07 DIAGNOSIS — R600 Localized edema: Secondary | ICD-10-CM | POA: Diagnosis not present

## 2021-05-07 DIAGNOSIS — Z91040 Latex allergy status: Secondary | ICD-10-CM | POA: Diagnosis not present

## 2021-05-07 DIAGNOSIS — R2242 Localized swelling, mass and lump, left lower limb: Secondary | ICD-10-CM | POA: Diagnosis not present

## 2021-05-07 DIAGNOSIS — Z7901 Long term (current) use of anticoagulants: Secondary | ICD-10-CM | POA: Insufficient documentation

## 2021-05-07 DIAGNOSIS — Z79899 Other long term (current) drug therapy: Secondary | ICD-10-CM | POA: Diagnosis not present

## 2021-05-07 DIAGNOSIS — I10 Essential (primary) hypertension: Secondary | ICD-10-CM | POA: Insufficient documentation

## 2021-05-07 DIAGNOSIS — E039 Hypothyroidism, unspecified: Secondary | ICD-10-CM | POA: Insufficient documentation

## 2021-05-07 DIAGNOSIS — L03116 Cellulitis of left lower limb: Secondary | ICD-10-CM | POA: Insufficient documentation

## 2021-05-07 DIAGNOSIS — Z85850 Personal history of malignant neoplasm of thyroid: Secondary | ICD-10-CM | POA: Diagnosis not present

## 2021-05-07 LAB — CBC WITH DIFFERENTIAL/PLATELET
Abs Immature Granulocytes: 0.04 10*3/uL (ref 0.00–0.07)
Basophils Absolute: 0 10*3/uL (ref 0.0–0.1)
Basophils Relative: 1 %
Eosinophils Absolute: 0.1 10*3/uL (ref 0.0–0.5)
Eosinophils Relative: 1 %
HCT: 42.3 % (ref 36.0–46.0)
Hemoglobin: 13.8 g/dL (ref 12.0–15.0)
Immature Granulocytes: 1 %
Lymphocytes Relative: 23 %
Lymphs Abs: 1.8 10*3/uL (ref 0.7–4.0)
MCH: 30.4 pg (ref 26.0–34.0)
MCHC: 32.6 g/dL (ref 30.0–36.0)
MCV: 93.2 fL (ref 80.0–100.0)
Monocytes Absolute: 1.5 10*3/uL — ABNORMAL HIGH (ref 0.1–1.0)
Monocytes Relative: 19 %
Neutro Abs: 4.5 10*3/uL (ref 1.7–7.7)
Neutrophils Relative %: 55 %
Platelets: 174 10*3/uL (ref 150–400)
RBC: 4.54 MIL/uL (ref 3.87–5.11)
RDW: 14.2 % (ref 11.5–15.5)
WBC: 8 10*3/uL (ref 4.0–10.5)
nRBC: 0 % (ref 0.0–0.2)

## 2021-05-07 LAB — COMPREHENSIVE METABOLIC PANEL
ALT: 26 U/L (ref 0–44)
AST: 23 U/L (ref 15–41)
Albumin: 3.6 g/dL (ref 3.5–5.0)
Alkaline Phosphatase: 61 U/L (ref 38–126)
Anion gap: 8 (ref 5–15)
BUN: 12 mg/dL (ref 8–23)
CO2: 28 mmol/L (ref 22–32)
Calcium: 9.7 mg/dL (ref 8.9–10.3)
Chloride: 103 mmol/L (ref 98–111)
Creatinine, Ser: 0.59 mg/dL (ref 0.44–1.00)
GFR, Estimated: >60 mL/min (ref >60)
Glucose, Bld: 112 mg/dL — ABNORMAL HIGH (ref 70–99)
Potassium: 3.8 mmol/L (ref 3.5–5.1)
Sodium: 139 mmol/L (ref 135–145)
Total Bilirubin: 0.5 mg/dL (ref 0.3–1.2)
Total Protein: 7 g/dL (ref 6.5–8.1)

## 2021-05-07 LAB — PROTIME-INR
INR: 2.2 — ABNORMAL HIGH (ref 0.8–1.2)
Prothrombin Time: 24 seconds — ABNORMAL HIGH (ref 11.4–15.2)

## 2021-05-07 MED ORDER — CEPHALEXIN 500 MG PO CAPS: 500.0000 mg | ORAL_CAPSULE | Freq: Four times a day (QID) | ORAL | 0 refills | Status: AC

## 2021-05-07 NOTE — ED Notes (Signed)
RN provided AVS using Teachback Method. Patient verbalizes understanding of Discharge Instructions. Opportunity for Questioning and Answers were provided by RN. Patient Discharged from ED ambulatory to Home with Family. ? ?

## 2021-05-07 NOTE — ED Notes (Signed)
Patient ambulated well to the BR

## 2021-05-07 NOTE — ED Notes (Signed)
Triage delay, pt in restroom when called for triage

## 2021-05-07 NOTE — ED Notes (Signed)
US at Bedside

## 2021-05-07 NOTE — ED Provider Notes (Signed)
[facility] EMERGENCY DEPT Provider Note   CSN: [id number] Arrival date & time: 05/07/21  1134     History Chief Complaint  Patient presents with   Leg Swelling    [name] is a 80 y.o. female.   Leg Pain Associated symptoms: no back pain, no fatigue, no fever and no neck pain   Patient presents for left leg and foot redness, pain, and swelling.  She has noticed this over the past 3 days.  She denies any recent trauma.  She does have chronic swelling in her bilateral lower extremities.  She denies any symptoms in her right leg.  She has not had any recent fevers, chills, nausea, or diarrhea.  She has not had any shortness of breath or chest discomfort.  Pain has been mild and has been limited to her distal left leg, ankle, and proximal foot.  She has not had any wounds or drainage.  She has been able to ambulate without difficulty.  She is currently on warfarin.    Past Medical History:  Diagnosis Date   Anxiety    takes Lorazepam daily as needed   Arthritis    Cancer ([place])    thyroid   Cataracts, bilateral    immature   Chronic back pain    compressed vertebrae,takes Fosamax weekly   Family history of adverse reaction to anesthesia    granddaughter gets very sick and mean   History of blood transfusion    no abnormal reaction noted   History of bronchitis 05/2015   History of colon polyps    benign   Hyperlipidemia    takes Zetia daily   Hypertension    takes Diltiazem and Avapro daily   Hypothyroidism    takes Synthroid daily   Ischemic colitis ([place])    hx of-many yrs ago   Joint pain    Joint swelling    OSA on CPAP    05-08-12 AHi was 46, RDI  53. titrated to   9 cm water  with 3 cm flex.-nadir 75%   PE (pulmonary embolism)    takes Coumadin daily   Peripheral edema    Pneumonia 1992   hx of   Sarcoidosis    Shortness of breath    Urinary frequency    Weakness    left leg.Numbness and tingling.Has sciatica   Wheeze     occaionally. Not new    Patient Active Problem List   Diagnosis Date Noted   Acute medial meniscus tear of right knee 03/31/2018   Acute pain of right knee 01/12/2018   Hypercoagulation syndrome ([place]) 05/24/2014   Hunter's glossitis 05/24/2014   Sarcoidosis of lung ([place]) 05/24/2014   Hypoxemia 05/24/2014   Essential hypertension, benign 10/13/2013   Nocturnal hypoxia 05/19/2013   Obstructive sleep apnea (adult) (pediatric) 05/19/2013   Obesity 05/19/2013   OSA on CPAP    Adenopathy 05/26/2012   Cough 05/20/2012   OSA (obstructive sleep apnea) 05/20/2012   Pulmonary embolism ([place]) 04/29/2012   DVT (deep venous thrombosis) ([place]) 04/29/2012    Past Surgical History:  Procedure Laterality Date   ABDOMINAL HYSTERECTOMY     APPENDECTOMY     BREAST BIOPSY     biopsies on right x 2   BREAST CYST ASPIRATION Left    CHOLECYSTECTOMY N/A 11/29/2015   Procedure: LAPAROSCOPIC CHOLECYSTECTOMY;  Surgeon: [name], MD;  Location: [facility];  Service: General;  Laterality: N/A;   COLONOSCOPY     DILATION AND CURETTAGE OF  UTERUS     LIVER BIOPSY     MELANOMA EXCISION     removed from left leg   scalene surgery  1972   "trying to find out what was wrong with her lungs"   THYROID SURGERY       OB History   No obstetric history on file.     Family History  Problem Relation Age of Onset   CVA Mother    Heart attack Neg Hx     Social History   Tobacco Use   Smoking status: Never   Smokeless tobacco: Never  Vaping Use   Vaping Use: Never used  Substance Use Topics   Alcohol use: No   Drug use: No    Home Medications Prior to Admission medications   Medication Sig Start Date End Date Taking? Authorizing Provider  calcium carbonate (TUMS - DOSED IN MG ELEMENTAL CALCIUM) 500 MG chewable tablet as needed. 05/09/09  Yes [provider]  Carboxymethylcellulose Sodium (REFRESH LIQUIGEL OP) Place 1 drop into both eyes at bedtime.   Yes [provider]  cephALEXin  (KEFLEX) 500 MG capsule Take 1 capsule (500 mg total) by mouth 4 (four) times daily for 7 days. 05/07/21 05/14/21 Yes Godfrey Pick, MD  diltiazem [facility]) 360 MG 24 hr capsule Take 1 capsule by mouth daily.   Yes [provider]  ergocalciferol (VITAMIN D2) 1.25 MG (50000 UT) capsule 1 capsule 03/16/21  Yes [provider]  fluticasone (FLONASE) 50 MCG/ACT nasal spray Place 1 spray into the nose daily as needed for allergies. For allergies.   Yes [provider]  ipratropium (ATROVENT) 0.03 % nasal spray take 1-2 sprays into each nostril up to 3 times daily as needed for ccongestion 09/30/18  Yes [provider]  irbesartan-hydrochlorothiazide (AVALIDE) 300-12.5 MG tablet Take 0.5 tablets by mouth daily. 09/14/18  Yes Jettie Booze, MD  levothyroxine (SYNTHROID, LEVOTHROID) 100 MCG tablet Take 100 mcg by mouth daily before breakfast.  10/07/14  Yes [provider]  LORazepam (ATIVAN) 1 MG tablet Take 1 mg by mouth as needed for anxiety.   Yes [provider]  Lutein-Zeaxanthin 25-5 MG CAPS Take 1 tablet by mouth daily. EYE VITAMIN   Yes [provider]  mupirocin ointment (BACTROBAN) 2 % Apply topically 2 (two) times daily. 03/22/21  Yes [provider]  triamcinolone cream (KENALOG) 0.1 % Apply topically 2 (two) times daily. 04/05/21  Yes [provider]  warfarin (COUMADIN) 5 MG tablet Take 5 mg by mouth every Wednesday. Take on Wednesday. 04/25/15  Yes [provider]  acetaminophen (TYLENOL) 325 MG tablet 1 tablet as needed    [provider]    Allergies    Codeine, Erythromycin, Tetracyclines & related, Statins, Atenolol, Citalopram hydrobromide, Coenzyme q10, Losartan potassium, and Latex  Review of Systems   Review of Systems  Constitutional:  Negative for activity change, appetite change, chills, fatigue and fever.  HENT:  Negative for ear pain and sore throat.   Eyes:  Negative for pain  and visual disturbance.  Respiratory:  Negative for cough and shortness of breath.   Cardiovascular:  Negative for chest pain and palpitations.  Gastrointestinal:  Negative for abdominal pain, diarrhea, nausea and vomiting.  Genitourinary:  Negative for dysuria and hematuria.  Musculoskeletal:  Negative for arthralgias, back pain, gait problem, myalgias and neck pain.  Skin:  Positive for color change. Negative for rash.  Neurological:  Negative for dizziness, seizures, syncope, weakness and numbness.  Hematological:  Bruises/bleeds easily (On warfarin).  Psychiatric/Behavioral:  Negative for confusion and decreased concentration.   All other systems reviewed and are negative.  Physical Exam Updated Vital Signs BP (!) 147/88 (BP Location: Right Arm)   Pulse 90   Temp 98 F (36.7 C)   Resp 16   Ht 5\' 4"  (1.626 m)   Wt 112 kg   SpO2 98%   BMI 42.40 kg/m   Physical Exam Vitals and nursing note reviewed.  Constitutional:      General: She is not in acute distress.    Appearance: Normal appearance. She is well-developed. She is not ill-appearing, toxic-appearing or diaphoretic.  HENT:     Head: Normocephalic and atraumatic.     Right Ear: External ear normal.     Left Ear: External ear normal.     Nose: Nose normal.     Mouth/Throat:     Mouth: Mucous membranes are moist.     Pharynx: Oropharynx is clear.  Eyes:     Conjunctiva/sclera: Conjunctivae normal.  Cardiovascular:     Rate and Rhythm: Normal rate and regular rhythm.     Heart sounds: No murmur heard. Pulmonary:     Effort: Pulmonary effort is normal. No respiratory distress.     Breath sounds: Normal breath sounds. No wheezing or rales.  Abdominal:     Palpations: Abdomen is soft.     Tenderness: There is no abdominal tenderness.  Musculoskeletal:        General: Swelling and tenderness present.     Cervical back: Normal range of motion and neck supple.     Right lower leg: Edema present.     Left lower leg:  Edema present.  Skin:    General: Skin is warm and dry.     Capillary Refill: Capillary refill takes less than 2 seconds.     Findings: Erythema present.  Neurological:     General: No focal deficit present.     Mental Status: She is alert and oriented to person, place, and time.     Cranial Nerves: No cranial nerve deficit.     Sensory: No sensory deficit.     Motor: No weakness.  Psychiatric:        Mood and Affect: Mood normal.        Behavior: Behavior normal.        Thought Content: Thought content normal.        Judgment: Judgment normal.     ED Results / Procedures / Treatments   Labs (all labs ordered are listed, but only abnormal results are displayed) Labs Reviewed  CBC WITH DIFFERENTIAL/PLATELET - Abnormal; Notable for the following components:      Result Value   Monocytes Absolute 1.5 (*)    All other components within normal limits  COMPREHENSIVE METABOLIC PANEL - Abnormal; Notable for the following components:   Glucose, Bld 112 (*)    All other components within normal limits  PROTIME-INR - Abnormal; Notable for the following components:   Prothrombin Time 24.0 (*)    INR 2.2 (*)    All other components within normal limits    EKG EKG Interpretation  Date/Time:  Tuesday May 07 2021 12:24:41 EST Ventricular Rate:  99 PR Interval:    QRS Duration: 86 QT Interval:  352 QTC Calculation: 451 R Axis:   57 Text Interpretation: Atrial fibrillation Low voltage QRS Abnormal ECG Confirmed by Godfrey Pick ([id number]) on 05/07/2021 2:10:46 PM  Radiology US Venous Img Lower  Unilateral Left  Result Date: 05/07/2021 CLINICAL DATA:  Left lower extremity pain and edema for the past week. Evaluate for DVT. EXAM: LEFT LOWER EXTREMITY VENOUS DOPPLER ULTRASOUND TECHNIQUE: Gray-scale sonography with graded compression, as well as color Doppler and duplex ultrasound were performed to evaluate the lower extremity deep venous systems from the level of the common femoral vein  and including the common femoral, femoral, profunda femoral, popliteal and calf veins including the posterior tibial, peroneal and gastrocnemius veins when visible. The superficial great saphenous vein was also interrogated. Spectral Doppler was utilized to evaluate flow at rest and with distal augmentation maneuvers in the common femoral, femoral and popliteal veins. COMPARISON:  None. FINDINGS: Contralateral Common Femoral Vein: Respiratory phasicity is normal and symmetric with the symptomatic side. No evidence of thrombus. Normal compressibility. Common Femoral Vein: No evidence of thrombus. Normal compressibility, respiratory phasicity and response to augmentation. Saphenofemoral Junction: No evidence of thrombus. Normal compressibility and flow on color Doppler imaging. Profunda Femoral Vein: No evidence of thrombus. Normal compressibility and flow on color Doppler imaging. Femoral Vein: No evidence of thrombus. Normal compressibility, respiratory phasicity and response to augmentation. Popliteal Vein: No evidence of thrombus. Normal compressibility, respiratory phasicity and response to augmentation. Calf Veins: No evidence of thrombus. Normal compressibility and flow on color Doppler imaging. Superficial Great Saphenous Vein: No evidence of thrombus. Normal compressibility. Venous Reflux:  None. Other Findings: There is a moderate amount of subcutaneous edema at the level of the left ankle (image 6. IMPRESSION: No evidence of DVT within the left lower extremity. Electronically Signed   By: Sandi Mariscal M.D.   On: 05/07/2021 15:22    Procedures Procedures   Medications Ordered in ED Medications - No data to display  ED Course  I have reviewed the triage vital signs and the nursing notes.  Pertinent labs & imaging results that were available during my care of the patient were reviewed by me and considered in my medical decision making (see chart for details).    MDM Rules/Calculators/A&P                           Patient presents for swelling, redness, and pain to the area of skin overlying her anterior distal left leg.  She is afebrile upon arrival.  On exam, area is warm to the touch.  He does have associated tenderness.  DVT study was negative.  Patient was prescribed antibiotics for treatment of cellulitis.  She was discharged in good condition.  Final Clinical Impression(s) / ED Diagnoses Final diagnoses:  Cellulitis of left lower extremity    Rx / DC Orders ED Discharge Orders          Ordered    cephALEXin (KEFLEX) 500 MG capsule  4 times daily        05/07/21 1546             Godfrey Pick, MD 05/08/21 0710

## 2021-05-07 NOTE — ED Triage Notes (Signed)
Pt arrives pov, ambulatory to triage, c/o left leg and foot redness and swelling. +2 edema and redness noted, hx of PE in 2008. Pt denies shob or cough

## 2021-05-09 ENCOUNTER — Other Ambulatory Visit: Payer: Self-pay

## 2021-05-09 ENCOUNTER — Ambulatory Visit: Payer: Medicare HMO | Admitting: Podiatry

## 2021-05-09 DIAGNOSIS — L600 Ingrowing nail: Secondary | ICD-10-CM | POA: Diagnosis not present

## 2021-05-09 DIAGNOSIS — L03116 Cellulitis of left lower limb: Secondary | ICD-10-CM

## 2021-05-12 NOTE — Progress Notes (Signed)
Subjective: 80 year old female presents the office today for follow-up evaluation of Her Right Big Toenail Removed but Her Main Concern Today Is Her Left Big Toenail Becoming More Painful and She Is Noticing Swelling or Redness around the Toenail Itself.  Of Note She Also Went to the Emergency Department for Cellulitis of Her Left Leg a Couple of Days Ago and She Was Started on Cephalexin Which She Is Still Taking.  Denies any fevers or chills.  Objective: AAO x3, NAD DP/PT pulses palpable bilaterally, CRT less than 3 seconds Status post right hallux toenail avulsion.  Appears to be doing better and almost healed.  Small mount of granulation tissue still evident.  There is no drainage or pus or significant pain.  There is no evidence of any residual toenail. On the left hallux toenail the nail is loose from the nail bed and there is incurvation of the nail borders.  There is localized and there is edema and erythema present along the nail borders.  There is no ascending cellulitis of the foot and no significant erythema to the foot but there is cellulitis present to the leg.  No open sores to the leg. No open lesions or pre-ulcerative lesions.  No pain with calf compression, swelling, warmth, erythema  Assessment: Left hallux ingrown toenail with infection; left leg cellulitis  Plan: -All treatment options discussed with the patient including all alternatives, risks, complications.  -Right side is doing better.  Continue watch with soap and water and apply a small amount of antibiotic ointment on the day but can leave the area open at nighttime. -Regards to the left side discussed with her total nail avulsion.  Given the infection we will do this without phenol today.  I am not convinced cellulitis coming from the toenail.  For cellulitis of the leg continue with cephalexin, elevation, compression. -At this time, recommended total nail removal without chemical matricectomy to the left hallux due to  infection. Risks and complications were discussed with the patient for which they understand and  verbally consent to the procedure. Under sterile conditions a total of 3 mL of a mixture of 2% lidocaine plain and 0.5% Marcaine plain was infiltrated in a hallux block fashion. Once anesthetized, the skin was prepped in sterile fashion. A tourniquet was then applied. Next the left hallux toenail was removed in total making sure to remove all nail borders.  I ensured that all the nail that I was able to palpate or see had been removed.  There is no purulence noted.  Once the nail was removed, the area was debrided and the underlying skin was intact. The area was irrigated and hemostasis was obtained.  A dry sterile dressing was applied. After application of the dressing the tourniquet was removed and there is found to be an immediate capillary refill time to the digit. The patient tolerated the procedure well any complications. Post procedure instructions were discussed the patient for which he verbally understood. Follow-up in one week for nail check or sooner if any problems are to arise. Discussed signs/symptoms of worsening infection and directed to call the office immediately should any occur or go directly to the emergency room. In the meantime, encouraged to call the office with any questions, concerns, changes symptoms. -Patient encouraged to call the office with any questions, concerns, change in symptoms.   [name]

## 2021-05-16 DIAGNOSIS — Z86711 Personal history of pulmonary embolism: Secondary | ICD-10-CM | POA: Diagnosis not present

## 2021-05-16 DIAGNOSIS — I80209 Phlebitis and thrombophlebitis of unspecified deep vessels of unspecified lower extremity: Secondary | ICD-10-CM | POA: Diagnosis not present

## 2021-05-16 DIAGNOSIS — Z7901 Long term (current) use of anticoagulants: Secondary | ICD-10-CM | POA: Diagnosis not present

## 2021-05-23 ENCOUNTER — Other Ambulatory Visit: Payer: Self-pay

## 2021-05-23 ENCOUNTER — Ambulatory Visit: Payer: Medicare HMO | Admitting: Podiatry

## 2021-05-23 DIAGNOSIS — I872 Venous insufficiency (chronic) (peripheral): Secondary | ICD-10-CM | POA: Diagnosis not present

## 2021-05-23 DIAGNOSIS — L03116 Cellulitis of left lower limb: Secondary | ICD-10-CM

## 2021-05-23 DIAGNOSIS — L600 Ingrowing nail: Secondary | ICD-10-CM

## 2021-05-26 NOTE — Progress Notes (Signed)
Subjective: 80 year old female presents the office today for follow evaluation after undergoing left total nail avulsion.  She states that the procedure site is healing nicely minimal drainage.  She thinks it is taking longer to heal as she thinks that she has been cleared up immediately otherwise she states that she has been doing fine.  She has been on antibiotics for cellulitis of her left leg.  She does have chronic edema to her left leg as well.  She previously been treated by her primary care physician for this as well.  Has any fevers or chills.  Objective: AAO x3, NAD DP/PT pulses palpable bilaterally, CRT less than 3 seconds Status post bilateral hallux toenail avulsions.  On the left side the scab is formed and there is no drainage or pus.  No edema, erythema.  On the right side it is still healing with a small amount of granulation tissue present but appears to be almost completely healed.  Minimal edema there is no drainage or pus or ascending cellulitis.  No signs of abscess. Bilateral chronic edema with the left side worse than right.  There is no skin breakdown or warmth on the legs.  No weeping present. No pain with calf compression, swelling, warmth, erythema  Assessment: 80 year old female with chronic edema left side worse than right; status post total nail avulsion  Plan: -All treatment options discussed with the patient including all alternatives, risks, complications.  -From a procedure standpoint she is healing appropriately on the left side.  Some delayed healing on the right side but appears to be improving there is no signs of infection.  Continue washing with soap and water and apply a small amount of antibiotic ointment followed by dressing in the day but leave it open at nighttime or dry bandage. -For the left leg we did wrap this today.  Silvadene was applied to the leg lightly followed by Kerlix, Coflex from the toes to just below the knee.  Encouraged  elevation. -Monitor for any clinical signs or symptoms of infection and directed to call the office immediately should any occur or go to the ER. -Patient encouraged to call the office with any questions, concerns, change in symptoms.   [name]

## 2021-05-31 ENCOUNTER — Ambulatory Visit: Payer: Medicare HMO | Admitting: Podiatry

## 2021-06-02 DIAGNOSIS — G4733 Obstructive sleep apnea (adult) (pediatric): Secondary | ICD-10-CM | POA: Diagnosis not present

## 2021-06-13 ENCOUNTER — Other Ambulatory Visit: Payer: Self-pay

## 2021-06-13 ENCOUNTER — Ambulatory Visit: Payer: Medicare HMO | Admitting: Podiatry

## 2021-06-13 DIAGNOSIS — L600 Ingrowing nail: Secondary | ICD-10-CM | POA: Diagnosis not present

## 2021-06-13 DIAGNOSIS — I872 Venous insufficiency (chronic) (peripheral): Secondary | ICD-10-CM | POA: Diagnosis not present

## 2021-06-17 NOTE — Progress Notes (Signed)
Subjective: 81 year old female presents the office today for follow evaluation after having toenail avulsions bilateral hallux as well as for left leg swelling.  She has been keeping in general her left leg and doing much better.  She still been cleaning the toes and are doing better and she is actually asking if she can follow-up with me as needed.  Denies any fevers or chills.  No drainage or pus.  Objective: AAO x3, NAD DP/PT pulses palpable bilaterally, CRT less than 3 seconds Status post toenail avulsion present bilaterally.  Left side is a scab.  The right side she keeps peeling of the skin.  There is no open sores] macerated tissue is present.  There is no drainage or pus.  No fluctuance or crepitation.  There is no malodor.  Chronic edema present bilateral legs without any pain, open lesions.   No pain with calf compression, warmth, erythema  Assessment: 81 year old female with chronic edema bilateral legs, status post toenail avulsions  Plan: -All treatment options discussed with the patient including all alternatives, risks, complications.  -On the right side she is peeling the skin off.  Recommended to not do this.  She thinks it is toenail discussed as likely hardened skin but no evidence of toenail present today.  Continue current antibiotic ointment and bandaging daily the area open at nighttime. -In regards to the left leg continue with the cream that she has, elevation as well as compression. -Patient encouraged to call the office with any questions, concerns, change in symptoms.   [name]

## 2021-06-20 DIAGNOSIS — Z7901 Long term (current) use of anticoagulants: Secondary | ICD-10-CM | POA: Diagnosis not present

## 2021-06-20 DIAGNOSIS — I80209 Phlebitis and thrombophlebitis of unspecified deep vessels of unspecified lower extremity: Secondary | ICD-10-CM | POA: Diagnosis not present

## 2021-06-20 DIAGNOSIS — Z86711 Personal history of pulmonary embolism: Secondary | ICD-10-CM | POA: Diagnosis not present

## 2021-07-03 DIAGNOSIS — G4733 Obstructive sleep apnea (adult) (pediatric): Secondary | ICD-10-CM | POA: Diagnosis not present

## 2021-07-17 ENCOUNTER — Ambulatory Visit: Payer: Medicare HMO | Admitting: Orthopaedic Surgery

## 2021-07-17 ENCOUNTER — Other Ambulatory Visit: Payer: Self-pay

## 2021-07-17 ENCOUNTER — Encounter: Payer: Self-pay | Admitting: Physician Assistant

## 2021-07-17 DIAGNOSIS — M65331 Trigger finger, right middle finger: Secondary | ICD-10-CM | POA: Diagnosis not present

## 2021-07-17 NOTE — Progress Notes (Signed)
Office Visit Note   Patient: [name]           Date of Birth: 01/07/1941           MRN: [id number] Visit Date: 07/17/2021              Requested by: [name], MD [address] [place],  [place] [address] PCP: [name], MD   Assessment & Plan: Visit Diagnoses:  1. Trigger finger, right middle finger     Plan: Impression is recurrent right long trigger finger.  Today, we discussed various treatment options to include repeat cortisone injection versus surgical intervention.  She would like to try 1 more injection today and if her symptoms do not significantly improve she will come back in to discuss surgery.  Otherwise, follow-up with [place] as needed.  Follow-Up Instructions: Return if symptoms worsen or fail to improve.   Orders:  Orders Placed This Encounter  Procedures   Hand/UE Inj: R long A1   No orders of the defined types were placed in this encounter.     Procedures: Hand/UE Inj: R long A1 for trigger finger on 07/17/2021 9:15 AM Indications: pain Details: 25 G needle Medications: 1 mL lidocaine 1 %; 0.33 mL bupivacaine 0.25 %; 13.33 mg methylPREDNISolone acetate 40 MG/ML     Clinical Data: No additional findings.   Subjective: Chief Complaint  Patient presents with   Right Middle Finger - Pain    HPI patient is a pleasant 81 year old female who comes in today with recurrent pain and triggering to the right long finger.  She was seen in our office in June for this condition which was injected with cortisone.  She does note that her pain slightly improved following the injection.  Her pain has significantly worsened over the past few months.  She has pain and triggering but does appear to be worse on certain days.  She would like a repeat injection today if possible.  Review of Systems as detailed in HPI.  All others reviewed and are negative.   Objective: Vital Signs: There were no vitals taken for this visit.  Physical Exam  well-developed well-nourished female no acute distress.  Alert and oriented x3.  Ortho Exam right long finger exam reveals moderate tenderness to the A1 pulley.  Reproducible triggering.  She is neurovascular intact distally.  Specialty Comments:  No specialty comments available.  Imaging: No new imaging   PMFS History: Patient Active Problem List   Diagnosis Date Noted   Acute medial meniscus tear of right knee 03/31/2018   Acute pain of right knee 01/12/2018   Hypercoagulation syndrome ([place]) 05/24/2014   Hunter's glossitis 05/24/2014   Sarcoidosis of lung ([place]) 05/24/2014   Hypoxemia 05/24/2014   Essential hypertension, benign 10/13/2013   Nocturnal hypoxia 05/19/2013   Obstructive sleep apnea (adult) (pediatric) 05/19/2013   Obesity 05/19/2013   OSA on CPAP    Adenopathy 05/26/2012   Cough 05/20/2012   OSA (obstructive sleep apnea) 05/20/2012   Pulmonary embolism ([place]) 04/29/2012   DVT (deep venous thrombosis) ([place]) 04/29/2012   Past Medical History:  Diagnosis Date   Anxiety    takes Lorazepam daily as needed   Arthritis    Cancer ([place])    thyroid   Cataracts, bilateral    immature   Chronic back pain    compressed vertebrae,takes Fosamax weekly   Family history of adverse reaction to anesthesia    granddaughter gets very sick and mean  History of blood transfusion    no abnormal reaction noted   History of bronchitis 05/2015   History of colon polyps    benign   Hyperlipidemia    takes Zetia daily   Hypertension    takes Diltiazem and Avapro daily   Hypothyroidism    takes Synthroid daily   Ischemic colitis ([place])    hx of-many yrs ago   Joint pain    Joint swelling    OSA on CPAP    05-08-12 AHi was 46, RDI  53. titrated to   9 cm water  with 3 cm flex.-nadir 75%   PE (pulmonary embolism)    takes Coumadin daily   Peripheral edema    Pneumonia 1992   hx of   Sarcoidosis    Shortness of breath    Urinary frequency    Weakness    left  leg.Numbness and tingling.Has sciatica   Wheeze    occaionally. Not new    Family History  Problem Relation Age of Onset   CVA Mother    Heart attack Neg Hx     Past Surgical History:  Procedure Laterality Date   ABDOMINAL HYSTERECTOMY     APPENDECTOMY     BREAST BIOPSY     biopsies on right x 2   BREAST CYST ASPIRATION Left    CHOLECYSTECTOMY N/A 11/29/2015   Procedure: LAPAROSCOPIC CHOLECYSTECTOMY;  Surgeon: [name], MD;  Location: [facility];  Service: General;  Laterality: N/A;   COLONOSCOPY     DILATION AND CURETTAGE OF UTERUS     LIVER BIOPSY     MELANOMA EXCISION     removed from left leg   scalene surgery  1972   "trying to find out what was wrong with her lungs"   THYROID SURGERY     Social History   Occupational History   Occupation: Retired    Fish farm manager: RETIRED  Tobacco Use   Smoking status: Never   Smokeless tobacco: Never  Vaping Use   Vaping Use: Never used  Substance and Sexual Activity   Alcohol use: No   Drug use: No   Sexual activity: Not on file

## 2021-07-18 DIAGNOSIS — H35033 Hypertensive retinopathy, bilateral: Secondary | ICD-10-CM | POA: Diagnosis not present

## 2021-07-18 DIAGNOSIS — H353132 Nonexudative age-related macular degeneration, bilateral, intermediate dry stage: Secondary | ICD-10-CM | POA: Diagnosis not present

## 2021-07-18 DIAGNOSIS — H4321 Crystalline deposits in vitreous body, right eye: Secondary | ICD-10-CM | POA: Diagnosis not present

## 2021-07-18 DIAGNOSIS — H524 Presbyopia: Secondary | ICD-10-CM | POA: Diagnosis not present

## 2021-07-18 DIAGNOSIS — H35373 Puckering of macula, bilateral: Secondary | ICD-10-CM | POA: Diagnosis not present

## 2021-07-18 MED ORDER — LIDOCAINE HCL 1 % IJ SOLN
1.0000 mL | INTRAMUSCULAR | Status: AC | PRN
  Administered 2021-07-17: 1 mL

## 2021-07-18 MED ORDER — BUPIVACAINE HCL 0.25 % IJ SOLN
0.3300 mL | INTRAMUSCULAR | Status: AC | PRN
  Administered 2021-07-17: .33 mL

## 2021-07-18 MED ORDER — METHYLPREDNISOLONE ACETATE 40 MG/ML IJ SUSP
13.3300 mg | INTRAMUSCULAR | Status: AC | PRN
  Administered 2021-07-17: 13.33 mg

## 2021-07-25 DIAGNOSIS — I80209 Phlebitis and thrombophlebitis of unspecified deep vessels of unspecified lower extremity: Secondary | ICD-10-CM | POA: Diagnosis not present

## 2021-07-25 DIAGNOSIS — Z7901 Long term (current) use of anticoagulants: Secondary | ICD-10-CM | POA: Diagnosis not present

## 2021-07-25 DIAGNOSIS — Z86711 Personal history of pulmonary embolism: Secondary | ICD-10-CM | POA: Diagnosis not present

## 2021-08-03 DIAGNOSIS — G4733 Obstructive sleep apnea (adult) (pediatric): Secondary | ICD-10-CM | POA: Diagnosis not present

## 2021-08-05 DIAGNOSIS — G4733 Obstructive sleep apnea (adult) (pediatric): Secondary | ICD-10-CM | POA: Diagnosis not present

## 2021-08-06 ENCOUNTER — Encounter: Payer: Self-pay | Admitting: Adult Health

## 2021-08-06 ENCOUNTER — Other Ambulatory Visit: Payer: Self-pay

## 2021-08-06 ENCOUNTER — Ambulatory Visit: Payer: Medicare HMO | Admitting: Adult Health

## 2021-08-06 ENCOUNTER — Telehealth: Payer: Self-pay | Admitting: Adult Health

## 2021-08-06 VITALS — BP 163/89 | HR 84 | Ht 63.0 in | Wt 248.4 lb

## 2021-08-06 DIAGNOSIS — G4733 Obstructive sleep apnea (adult) (pediatric): Secondary | ICD-10-CM

## 2021-08-06 DIAGNOSIS — Z9989 Dependence on other enabling machines and devices: Secondary | ICD-10-CM

## 2021-08-06 NOTE — Patient Instructions (Signed)
Continue using CPAP nightly and greater than 4 hours each night °If your symptoms worsen or you develop new symptoms please let us know.  ° °

## 2021-08-06 NOTE — Telephone Encounter (Signed)
Pt called stating that she checked with [facility] and they informed her that the last time they replaced her Cpap was in 2013. That is the only thing/date they have on file. Please advise.

## 2021-08-06 NOTE — Progress Notes (Signed)
[facility]   PATIENT: [name] DOB: 05/17/1941  REASON FOR VISIT: follow up HISTORY FROM: patient  HISTORY OF PRESENT ILLNESS: Today 08/06/21:  [name] is an 81 year old female with a history of obstructive sleep apnea on CPAP.  Her download indicates that she used her machine nightly for compliance of 100%.  She used her machine greater than 4 hours each night.  On average she uses her machine 5 hours and 49 minutes.  Her residual AHI is 1 on 10.6 cm of water with EPR 3.  Leak in the 95th percentile is 45.4 L/min  08/06/20: [name] is an 81 year old female with a history of obstructive sleep apnea on CPAP.  She reports that the CPAP works well for her.  She has the nasal pillows and reports that sometime during the night she will open her mouth to breathe.  Her mouth becomes very dry.  She is open to trying a different mask.  Her download is below.  Compliance Report Usage 07/07/2020 - 08/05/2020 Usage days 30/30 days (100%) >= 4 hours 30 days (100%) Average usage (days used) 5 hours 47 minutes  AirSense 10 AutoSet Serial number [id number] Mode CPAP Set pressure 10.6 cmH2O EPR Fulltime EPR level 3 Therapy Leaks - L/min Median: 23.4 95th percentile: 42.3 Maximum: 64.8 Events per hour AI: 0.5 HI: 0.2 AHI: 0.7 Apnea Index Central: 0.0 Obstructive: 0.4 Unknown: 0.1    REVIEW OF SYSTEMS: Out of a complete 14 system review of symptoms, the patient complains only of the following symptoms, and all other reviewed systems are negative.  FSS 11 ESS 8  ALLERGIES: Allergies  Allergen Reactions   Codeine Other (See Comments)    Causes hyperactivity   Erythromycin Itching and Other (See Comments)    Also with jitters   Tetracyclines & Related Itching    Also with jitters   Statins Other (See Comments)    Leg muscle weakness   Atenolol     Other reaction(s): Depression   Citalopram Hydrobromide     Other reaction(s): Nausea/Fatigue   Coenzyme Q10     Other  reaction(s): Itching   Losartan Potassium     Other reaction(s): muscle aches   Latex Rash    HOME MEDICATIONS: Outpatient Medications Prior to Visit  Medication Sig Dispense Refill   acetaminophen (TYLENOL) 325 MG tablet 1 tablet as needed     calcium carbonate (TUMS - DOSED IN MG ELEMENTAL CALCIUM) 500 MG chewable tablet as needed.     Carboxymethylcellulose Sodium (REFRESH LIQUIGEL OP) Place 1 drop into both eyes at bedtime.     diltiazem (TIAZAC) 360 MG 24 hr capsule Take 1 capsule by mouth daily.     ergocalciferol (VITAMIN D2) 1.25 MG (50000 UT) capsule 1 capsule     fluticasone (FLONASE) 50 MCG/ACT nasal spray Place 1 spray into the nose daily as needed for allergies. For allergies.     ipratropium (ATROVENT) 0.03 % nasal spray take 1-2 sprays into each nostril up to 3 times daily as needed for ccongestion     irbesartan-hydrochlorothiazide (AVALIDE) 300-12.5 MG tablet Take 0.5 tablets by mouth daily. 45 tablet 3   levothyroxine (SYNTHROID, LEVOTHROID) 100 MCG tablet Take 100 mcg by mouth daily before breakfast.   1   LORazepam (ATIVAN) 1 MG tablet Take 1 mg by mouth as needed for anxiety.     Lutein-Zeaxanthin 25-5 MG CAPS Take 1 tablet by mouth daily. EYE VITAMIN     mupirocin ointment (BACTROBAN) 2 % Apply topically  2 (two) times daily.     triamcinolone cream (KENALOG) 0.1 % Apply topically 2 (two) times daily.     warfarin (COUMADIN) 5 MG tablet Take 5 mg by mouth every Wednesday. Take on Wednesday.  2   No facility-administered medications prior to visit.    PAST MEDICAL HISTORY: Past Medical History:  Diagnosis Date   Anxiety    takes Lorazepam daily as needed   Arthritis    Cancer ([place])    thyroid   Cataracts, bilateral    immature   Chronic back pain    compressed vertebrae,takes Fosamax weekly   Family history of adverse reaction to anesthesia    granddaughter gets very sick and mean   History of blood transfusion    no abnormal reaction noted   History of  bronchitis 05/2015   History of colon polyps    benign   Hyperlipidemia    takes Zetia daily   Hypertension    takes Diltiazem and Avapro daily   Hypothyroidism    takes Synthroid daily   Ischemic colitis ([place])    hx of-many yrs ago   Joint pain    Joint swelling    OSA on CPAP    05-08-12 AHi was 46, RDI  53. titrated to   9 cm water  with 3 cm flex.-nadir 75%   PE (pulmonary embolism)    takes Coumadin daily   Peripheral edema    Pneumonia 1992   hx of   Sarcoidosis    Shortness of breath    Urinary frequency    Weakness    left leg.Numbness and tingling.Has sciatica   Wheeze    occaionally. Not new    PAST SURGICAL HISTORY: Past Surgical History:  Procedure Laterality Date   ABDOMINAL HYSTERECTOMY     APPENDECTOMY     BREAST BIOPSY     biopsies on right x 2   BREAST CYST ASPIRATION Left    CHOLECYSTECTOMY N/A 11/29/2015   Procedure: LAPAROSCOPIC CHOLECYSTECTOMY;  Surgeon: [name], MD;  Location: [facility];  Service: General;  Laterality: N/A;   COLONOSCOPY     DILATION AND CURETTAGE OF UTERUS     LIVER BIOPSY     MELANOMA EXCISION     removed from left leg   scalene surgery  1972   "trying to find out what was wrong with her lungs"   THYROID SURGERY      FAMILY HISTORY: Family History  Problem Relation Age of Onset   CVA Mother    Heart attack Neg Hx     SOCIAL HISTORY: Social History   Socioeconomic History   Marital status: Married    Spouse name: Not on file   Number of children: Not on file   Years of education: Not on file   Highest education level: Not on file  Occupational History   Occupation: Retired    Fish farm manager: RETIRED  Tobacco Use   Smoking status: Never   Smokeless tobacco: Never  Vaping Use   Vaping Use: Never used  Substance and Sexual Activity   Alcohol use: No   Drug use: No   Sexual activity: Not on file  Other Topics Concern   Not on file  Social History Narrative   Not on file   Social Determinants of Health    Financial Resource Strain: Not on file  Food Insecurity: Not on file  Transportation Needs: Not on file  Physical Activity: Not on file  Stress: Not on file  Social Connections: Not on file  Intimate Partner Violence: Not on file      PHYSICAL EXAM  Vitals:   08/06/21 0802  BP: (!) 163/89  Pulse: 84  Weight: 248 lb 6.4 oz (112.7 kg)  Height: 5\' 3"  (1.6 m)   Body mass index is 44 kg/m.  Generalized: Well developed, in no acute distress  Chest: Lungs clear to auscultation bilaterally  Neurological examination  Mentation: Alert oriented to time, place, history taking. Follows all commands speech and language fluent Cranial nerve II-XII: Extraocular movements were full, visual field were full on confrontational test Head turning and shoulder shrug  were normal and symmetric. Motor: The motor testing reveals 5 over 5 strength of all 4 extremities. Good symmetric motor tone is noted throughout.  Sensory: Sensory testing is intact to soft touch on all 4 extremities. No evidence of extinction is noted.  Gait and station: Gait is normal.    DIAGNOSTIC DATA (LABS, IMAGING, TESTING) - I reviewed patient records, labs, notes, testing and imaging myself where available.  Lab Results  Component Value Date   WBC 8.0 05/07/2021   HGB 13.8 05/07/2021   HCT 42.3 05/07/2021   MCV 93.2 05/07/2021   PLT 174 05/07/2021      Component Value Date/Time   NA 139 05/07/2021 1437   NA 140 11/29/2014 0839   K 3.8 05/07/2021 1437   K 4.4 11/29/2014 0839   CL 103 05/07/2021 1437   CL 104 04/29/2012 1145   CO2 28 05/07/2021 1437   CO2 28 11/29/2014 0839   GLUCOSE 112 (H) 05/07/2021 1437   GLUCOSE 121 11/29/2014 0839   GLUCOSE 93 04/29/2012 1145   BUN 12 05/07/2021 1437   BUN 13.5 11/29/2014 0839   CREATININE 0.59 05/07/2021 1437   CREATININE 0.8 11/29/2014 0839   CALCIUM 9.7 05/07/2021 1437   CALCIUM 9.1 11/29/2014 0839   PROT 7.0 05/07/2021 1437   PROT 7.4 11/29/2014 0839    ALBUMIN 3.6 05/07/2021 1437   ALBUMIN 3.4 (L) 11/29/2014 0839   AST 23 05/07/2021 1437   AST 19 11/29/2014 0839   ALT 26 05/07/2021 1437   ALT 24 11/29/2014 0839   ALKPHOS 61 05/07/2021 1437   ALKPHOS 84 11/29/2014 0839   BILITOT 0.5 05/07/2021 1437   BILITOT 0.42 11/29/2014 0839   GFRNONAA >60 05/07/2021 1437   GFRAA >60 11/28/2015 0844    Lab Results  Component Value Date   TSH 2.500 05/24/2014      ASSESSMENT AND PLAN 81 y.o. year old female  has a past medical history of Anxiety, Arthritis, Cancer ([place]), Cataracts, bilateral, Chronic back pain, Family history of adverse reaction to anesthesia, History of blood transfusion, History of bronchitis (05/2015), History of colon polyps, Hyperlipidemia, Hypertension, Hypothyroidism, Ischemic colitis ([place]), Joint pain, Joint swelling, OSA on CPAP, PE (pulmonary embolism), Peripheral edema, Pneumonia (1992), Sarcoidosis, Shortness of breath, Urinary frequency, Weakness, and Wheeze. here with:  OSA on CPAP  - CPAP compliance excellent - Good treatment of AHI  - Encourage patient to use CPAP nightly and > 4 hours each night - Adjust humidification - F/U in 1 year or sooner if needed     Ward Givens, MSN, NP-C 08/06/2021, 8:30 AM [facility] [address] [place], [place] [address] [phone]

## 2021-08-06 NOTE — Telephone Encounter (Signed)
Noted- if she wants another CPAP then we can order. HST first

## 2021-08-07 NOTE — Addendum Note (Signed)
Addended by: Trudie Buckler on: 08/07/2021 03:10 PM ? ? Modules accepted: Orders ? ?

## 2021-08-28 DIAGNOSIS — Z7901 Long term (current) use of anticoagulants: Secondary | ICD-10-CM | POA: Diagnosis not present

## 2021-08-28 DIAGNOSIS — I80209 Phlebitis and thrombophlebitis of unspecified deep vessels of unspecified lower extremity: Secondary | ICD-10-CM | POA: Diagnosis not present

## 2021-08-28 DIAGNOSIS — Z86711 Personal history of pulmonary embolism: Secondary | ICD-10-CM | POA: Diagnosis not present

## 2021-08-29 ENCOUNTER — Telehealth: Payer: Self-pay

## 2021-08-29 NOTE — Telephone Encounter (Signed)
LVM for pt to call me back to schedule sleep study  

## 2021-09-09 ENCOUNTER — Ambulatory Visit (INDEPENDENT_AMBULATORY_CARE_PROVIDER_SITE_OTHER): Payer: Medicare HMO | Admitting: Neurology

## 2021-09-09 DIAGNOSIS — G4733 Obstructive sleep apnea (adult) (pediatric): Secondary | ICD-10-CM

## 2021-09-09 DIAGNOSIS — D869 Sarcoidosis, unspecified: Secondary | ICD-10-CM

## 2021-09-11 NOTE — Progress Notes (Signed)
? ? ? ?  ?  ?[facility] ?  ?HOME SLEEP TEST REPORT ( by Watch PAT)   ?STUDY DATE:  09-09-2021 ?  ?PRMARY CARE :   [name]  ?REFERRING CLINICIAN: Ward Givens, NP  ?  ?CLINICAL INFORMATION/HISTORY: 08/06/21: ?  ?[name] is an 81 year old female with a history of obstructive sleep apnea on CPAP.  She also carries a diagnosis of peripheral edema, sarcoidosis, urinary frequency, sciatica of the left leg, ischemic colitis, hypothyroidism, hypertension and hyperlipidemia. ?Her download indicates that she used her machine nightly for compliance of 100%.  She used her machine greater than 4 hours each night.  On average she uses her machine 5 hours and 49 minutes.  Her residual AHI is 1 on 10.6 cm of water with EPR 3.  Leak in the 95th percentile is 45.4 L/min. New machine needed. ?  ?Epworth sleepiness score:8 /24. On CPAP ?  ?BMI: 44 kg/m? ?  ?Neck Circumference: n/a ?  ?FINDINGS: ?  ?Sleep Summary: ?  ?Total Recording Time (hours, min):  7 hours and 19 m.    ?  ?Total Sleep Time (hours, min): 6 hours and 7 m.              ?  ?Percent REM (%): 5.6%                                     ?  ?Respiratory Indices: ?  ?Calculated pAHI (per hour):    44/h                       ?REM pAHI:  n/a                                             ?NREM pAHI:    n/a                         ?  ?Positional AHI: positional independent AHI: in supine sleep at 52.9/h; on the right side at 49.9/h; on the left side at 32.4/h.   ? ?Snoring statistics showed a mean volume of 41 dB and snoring had been present for 33% of the total sleep time.                                             ?  ?Oxygen Saturation Statistics: ?   ?O2 Saturation Range (%):    Between a nadir of 78% and a maximum of 97% with a mean saturation at 89%.                                 ?  ?O2 Saturation (minutes) <89%: 122.4 minutes the equivalent of 33% of total sleep time.  This is severe.        ?  ?Pulse Rate Statistics:            ?  ?Pulse  Range:    Between 40 to 107 bpm with a mean heart rate of 64 bpm.           ?  ?  IMPRESSION:  This HST confirms the presence of severe sleep apnea, not REM sleep dependent.  Severe sleep-related hypoxia with a prolonged time in hypoxemia.  This patient definitely needs to continue using CPAP. ?Her current 95th percentile pressure was 10.6 cm water, 95th percentile air leak was 42 L/min, residual AHI was 0.7. ? ?  ?RECOMMENDATION: Continue positive airway pressure therapy with an auto titration device by ResMed set between 6 and 15 cmH2O, with 2 cm EPR, heated humidification and mask of patient's choice and comfort.  She was using nasal pillows in the past she may need to use these with a chinstrap. ? ?  ?INTERPRETING PHYSICIAN: ? ? Larey Seat, MD  ? ?Medical Director of [facility].  ? ? ? ? ? ? ? ? ? ? ? ? ? ? ? ? ? ? ?

## 2021-09-12 DIAGNOSIS — G4733 Obstructive sleep apnea (adult) (pediatric): Secondary | ICD-10-CM | POA: Insufficient documentation

## 2021-09-12 NOTE — Procedures (Signed)
? ?  ?  ?[facility] ?  ?HOME SLEEP TEST REPORT ( by Watch PAT)   ?STUDY DATE:  09-09-2021 ?  ?PRMARY CARE :   [name]  ?REFERRING CLINICIAN: Ward Givens, NP  ?  ?CLINICAL INFORMATION/HISTORY: 08/06/21: ?  ?[name] is an 81 year old female with a history of obstructive sleep apnea on CPAP.  She also carries a diagnosis of peripheral edema, sarcoidosis, urinary frequency, sciatica of the left leg, ischemic colitis, hypothyroidism, hypertension and hyperlipidemia. ?Her download indicates that she used her machine nightly for compliance of 100%.  She used her machine greater than 4 hours each night.  On average she uses her machine 5 hours and 49 minutes.  Her residual AHI is 1 on 10.6 cm of water with EPR 3.  Leak in the 95th percentile is 45.4 L/min. New machine needed. ?  ?Epworth sleepiness score:8 /24. On CPAP ?  ?BMI: 44 kg/m? ?  ?Neck Circumference: n/a ?  ?FINDINGS: ?  ?Sleep Summary: ?  ?Total Recording Time (hours, min):  7 hours and 19 m.    ?  ?Total Sleep Time (hours, min): 6 hours and 7 m.              ?  ?Percent REM (%): 5.6%                                     ?  ?Respiratory Indices: ?  ?Calculated pAHI (per hour):    44/h                       ?REM pAHI:  n/a                                             ?NREM pAHI:    n/a                         ?  ?Positional AHI: positional independent AHI: in supine sleep at 52.9/h; on the right side at 49.9/h; on the left side at 32.4/h.   ? ?Snoring statistics showed a mean volume of 41 dB and snoring had been present for 33% of the total sleep time.                                             ?  ?Oxygen Saturation Statistics: ?   ?O2 Saturation Range (%):    Between a nadir of 78% and a maximum of 97% with a mean saturation at 89%.                                 ?  ?O2 Saturation (minutes) <89%: 122.4 minutes the equivalent of 33% of total sleep time.  This is severe.        ?  ?Pulse Rate Statistics:            ?  ?Pulse Range:     Between 40 to 107 bpm with a mean heart rate of 64 bpm.           ?  ?  IMPRESSION:  This HST confirms the presence of severe sleep apnea, not REM sleep dependent.  Severe sleep-related hypoxia with a prolonged time in hypoxemia.  This patient definitely needs to continue using CPAP. ?Her current 95th percentile pressure was 10.6 cm water, 95th percentile air leak was 42 L/min, residual AHI was 0.7. ? ?  ?RECOMMENDATION: Continue positive airway pressure therapy with an auto titration device by ResMed set between 6 and 15 cmH2O, with 2 cm EPR, heated humidification and mask of patient's choice and comfort.  She was using nasal pillows in the past she may need to use these with a chinstrap. ? ?  ?INTERPRETING PHYSICIAN: ? ? Larey Seat, MD  ? ?Medical Director of [facility].  ? ? ? ? ? ? ? ? ? ? ? ? ? ? ? ? ? ?

## 2021-09-16 ENCOUNTER — Telehealth: Payer: Self-pay

## 2021-09-16 NOTE — Telephone Encounter (Signed)
-----   Message from Larey Seat, MD sent at 09/16/2021  1:25 PM EDT ----- ?IMPRESSION:  This HST confirms the presence of severe sleep apnea, not REM sleep dependent.  Severe sleep-related hypoxia with a prolonged time in hypoxemia.  This patient definitely needs to continue using CPAP. ?Her current 95th percentile pressure was 10.6 cm water, 95th percentile air leak was 42 L/min, residual AHI was 0.7. ?? ?? ?RECOMMENDATION: Continue positive airway pressure therapy with an auto titration device by ResMed set between 6 and 15 cmH2O, with 2 cm EPR, heated humidification and mask of patient's choice and comfort.  She was using nasal pillows in the past she may need to use these with a chinstrap. ?? ?? ?

## 2021-09-16 NOTE — Addendum Note (Signed)
Addended by: Larey Seat on: 09/16/2021 01:25 PM ? ? Modules accepted: Orders ? ?

## 2021-09-16 NOTE — Telephone Encounter (Signed)
I called pt. I had an extended conversation with her greater than 15 minutes. I advised pt that Dr. Brett Fairy reviewed their sleep study results and found that pt has osa. Dr. Brett Fairy recommends that pt start a new cpap at home. I reviewed PAP compliance expectations with the pt. Pt is agreeable to starting an auto-PAP. I advised pt that an order will be sent to a DME, [facility], and [facility] will call the pt within about one week after they file with the pt's insurance. [facility] will show the pt how to use the machine, fit for masks, and troubleshoot the auto-PAP if needed. A follow up appt was made for insurance purposes with Jinny Blossom, NP on 12/30/21 at 11:30am. Pt verbalized understanding to arrive 15 minutes early and bring their auto-PAP. A letter with all of this information in it will be mailed to the pt as a reminder. I verified with the pt that the address we have on file is correct. Pt verbalized understanding of results. Pt had no questions at this time but was encouraged to call back if questions arise. I have sent the order to [facility] and have received confirmation that they have received the order. ? ?

## 2021-09-23 ENCOUNTER — Other Ambulatory Visit: Payer: Self-pay | Admitting: Internal Medicine

## 2021-09-23 DIAGNOSIS — Z1231 Encounter for screening mammogram for malignant neoplasm of breast: Secondary | ICD-10-CM

## 2021-09-25 DIAGNOSIS — G4733 Obstructive sleep apnea (adult) (pediatric): Secondary | ICD-10-CM | POA: Diagnosis not present

## 2021-09-26 ENCOUNTER — Telehealth: Payer: Self-pay | Admitting: Adult Health

## 2021-09-26 NOTE — Telephone Encounter (Signed)
Pt was scheduled for Initial CPAP visit on 12/19/21. ?Pt informed to bring machine and power cord to appt ?DME: [facility] ?Phone: [phone] ?Fax: [phone] ?Equipment issued: Resmed S10 on 09/25/21 ?Pt to be schedule between 10/26/21-12/25/21 ?

## 2021-10-02 DIAGNOSIS — I80209 Phlebitis and thrombophlebitis of unspecified deep vessels of unspecified lower extremity: Secondary | ICD-10-CM | POA: Diagnosis not present

## 2021-10-02 DIAGNOSIS — Z7901 Long term (current) use of anticoagulants: Secondary | ICD-10-CM | POA: Diagnosis not present

## 2021-10-02 DIAGNOSIS — Z86711 Personal history of pulmonary embolism: Secondary | ICD-10-CM | POA: Diagnosis not present

## 2021-10-10 ENCOUNTER — Ambulatory Visit
Admission: RE | Admit: 2021-10-10 | Discharge: 2021-10-10 | Disposition: A | Discharge status: Home / Self Care | Payer: Medicare HMO | Source: Ambulatory Visit | Attending: Internal Medicine | Admitting: Internal Medicine

## 2021-10-10 DIAGNOSIS — Z1231 Encounter for screening mammogram for malignant neoplasm of breast: Secondary | ICD-10-CM | POA: Diagnosis not present

## 2021-10-25 DIAGNOSIS — G4733 Obstructive sleep apnea (adult) (pediatric): Secondary | ICD-10-CM | POA: Diagnosis not present

## 2021-11-12 DIAGNOSIS — Z86711 Personal history of pulmonary embolism: Secondary | ICD-10-CM | POA: Diagnosis not present

## 2021-11-12 DIAGNOSIS — Z7901 Long term (current) use of anticoagulants: Secondary | ICD-10-CM | POA: Diagnosis not present

## 2021-11-12 DIAGNOSIS — I80209 Phlebitis and thrombophlebitis of unspecified deep vessels of unspecified lower extremity: Secondary | ICD-10-CM | POA: Diagnosis not present

## 2021-11-25 DIAGNOSIS — G4733 Obstructive sleep apnea (adult) (pediatric): Secondary | ICD-10-CM | POA: Diagnosis not present

## 2021-12-18 DIAGNOSIS — I80209 Phlebitis and thrombophlebitis of unspecified deep vessels of unspecified lower extremity: Secondary | ICD-10-CM | POA: Diagnosis not present

## 2021-12-18 DIAGNOSIS — Z86711 Personal history of pulmonary embolism: Secondary | ICD-10-CM | POA: Diagnosis not present

## 2021-12-18 DIAGNOSIS — Z7901 Long term (current) use of anticoagulants: Secondary | ICD-10-CM | POA: Diagnosis not present

## 2021-12-18 NOTE — Progress Notes (Unsigned)
PATIENT: [name] DOB: 06/06/1941  REASON FOR VISIT: follow up HISTORY FROM: patient  Chief Complaint  Patient presents with   RM 19    Here alone for initial CPAP. She states she started using a new FFM and changed from the pillows. She doesn't think the bottom of her chin is covered enough because she states she has a long face. ESS 6.     HISTORY OF PRESENT ILLNESS: Today 12/19/21:  [name] is an 81 year old female with a history of obstructive sleep apnea on CPAP.  She received a new machine.  Her download is below.  She does state that she has a fullface mask but does not feel that it fits appropriately.    08/06/21: [name] is an 81 year old female with a history of obstructive sleep apnea on CPAP.  Her download indicates that she used her machine nightly for compliance of 100%.  She used her machine greater than 4 hours each night.  On average she uses her machine 5 hours and 49 minutes.  Her residual AHI is 1 on 10.6 cm of water with EPR 3.  Leak in the 95th percentile is 45.4 L/min  08/06/20: [name] is an 81 year old female with a history of obstructive sleep apnea on CPAP.  She reports that the CPAP works well for her.  She has the nasal pillows and reports that sometime during the night she will open her mouth to breathe.  Her mouth becomes very dry.  She is open to trying a different mask.  Her download is below.  Compliance Report Usage 07/07/2020 - 08/05/2020 Usage days 30/30 days (100%) >= 4 hours 30 days (100%) Average usage (days used) 5 hours 47 minutes  AirSense 10 AutoSet Serial number [id number] Mode CPAP Set pressure 10.6 cmH2O EPR Fulltime EPR level 3 Therapy Leaks - L/min Median: 23.4 95th percentile: 42.3 Maximum: 64.8 Events per hour AI: 0.5 HI: 0.2 AHI: 0.7 Apnea Index Central: 0.0 Obstructive: 0.4 Unknown: 0.1    REVIEW OF SYSTEMS: Out of a complete 14 system review of symptoms, the patient complains only  of the following symptoms, and all other reviewed systems are negative.   ESS 6  ALLERGIES: Allergies  Allergen Reactions   Codeine Other (See Comments)    Causes hyperactivity   Erythromycin Itching and Other (See Comments)    Also with jitters   Tetracyclines & Related Itching    Also with jitters   Statins Other (See Comments)    Leg muscle weakness   Atenolol     Other reaction(s): Depression   Citalopram Hydrobromide     Other reaction(s): Nausea/Fatigue   Coenzyme Q10     Other reaction(s): Itching   Losartan Potassium     Other reaction(s): muscle aches   Latex Rash    HOME MEDICATIONS: Outpatient Medications Prior to Visit  Medication Sig Dispense Refill   acetaminophen (TYLENOL) 325 MG tablet 1 tablet as needed     calcium carbonate (TUMS - DOSED IN MG ELEMENTAL CALCIUM) 500 MG chewable tablet as needed.     Carboxymethylcellulose Sodium (REFRESH LIQUIGEL OP) Place 1 drop into both eyes at bedtime.     diltiazem (TIAZAC) 360 MG 24 hr capsule Take 1 capsule by mouth daily.     ergocalciferol (VITAMIN D2) 1.25 MG (50000 UT) capsule 1 capsule     fluticasone (FLONASE) 50 MCG/ACT nasal spray Place 1 spray into the nose daily as needed for allergies. For allergies.  ipratropium (ATROVENT) 0.03 % nasal spray take 1-2 sprays into each nostril up to 3 times daily as needed for ccongestion     irbesartan-hydrochlorothiazide (AVALIDE) 300-12.5 MG tablet Take 0.5 tablets by mouth daily. 45 tablet 3   levothyroxine (SYNTHROID, LEVOTHROID) 100 MCG tablet Take 100 mcg by mouth daily before breakfast.   1   LORazepam (ATIVAN) 1 MG tablet Take 1 mg by mouth as needed for anxiety.     Lutein-Zeaxanthin 25-5 MG CAPS Take 1 tablet by mouth daily. EYE VITAMIN     mupirocin ointment (BACTROBAN) 2 % Apply topically 2 (two) times daily.     triamcinolone cream (KENALOG) 0.1 % Apply topically 2 (two) times daily.     warfarin (COUMADIN) 5 MG tablet Take 5 mg by mouth every Wednesday.  Take on Wednesday.  2   No facility-administered medications prior to visit.    PAST MEDICAL HISTORY: Past Medical History:  Diagnosis Date   Anxiety    takes Lorazepam daily as needed   Arthritis    Cancer (HCC)    thyroid   Cataracts, bilateral    immature   Chronic back pain    compressed vertebrae,takes Fosamax weekly   Family history of adverse reaction to anesthesia    granddaughter gets very sick and mean   History of blood transfusion    no abnormal reaction noted   History of bronchitis 05/2015   History of colon polyps    benign   Hyperlipidemia    takes Zetia daily   Hypertension    takes Diltiazem and Avapro daily   Hypothyroidism    takes Synthroid daily   Ischemic colitis (HCC)    hx of-many yrs ago   Joint pain    Joint swelling    OSA on CPAP    05-08-12 AHi was 46, RDI  53. titrated to   9 cm water  with 3 cm flex.-nadir 75%   PE (pulmonary embolism)    takes Coumadin daily   Peripheral edema    Pneumonia 1992   hx of   Sarcoidosis    Shortness of breath    Urinary frequency    Weakness    left leg.Numbness and tingling.Has sciatica   Wheeze    occaionally. Not new    PAST SURGICAL HISTORY: Past Surgical History:  Procedure Laterality Date   ABDOMINAL HYSTERECTOMY     APPENDECTOMY     BREAST BIOPSY     biopsies on right x 2   BREAST CYST ASPIRATION Left    CHOLECYSTECTOMY N/A 11/29/2015   Procedure: LAPAROSCOPIC CHOLECYSTECTOMY;  Surgeon: Almond Lint, MD;  Location: [facility] OR;  Service: General;  Laterality: N/A;   COLONOSCOPY     DILATION AND CURETTAGE OF UTERUS     LIVER BIOPSY     MELANOMA EXCISION     removed from left leg   scalene surgery  1972   "trying to find out what was wrong with her lungs"   THYROID SURGERY      FAMILY HISTORY: Family History  Problem Relation Age of Onset   Breast cancer Mother    CVA Mother    Breast cancer Sister    Breast cancer Sister    Heart attack Neg Hx     SOCIAL HISTORY: Social  History   Socioeconomic History   Marital status: Married    Spouse name: Not on file   Number of children: Not on file   Years of education: Not on file  Highest education level: Not on file  Occupational History   Occupation: Retired    Associate Professor: RETIRED  Tobacco Use   Smoking status: Never   Smokeless tobacco: Never  Vaping Use   Vaping Use: Never used  Substance and Sexual Activity   Alcohol use: No   Drug use: No   Sexual activity: Not on file  Other Topics Concern   Not on file  Social History Narrative   Not on file   Social Determinants of Health   Financial Resource Strain: Not on file  Food Insecurity: Not on file  Transportation Needs: Not on file  Physical Activity: Not on file  Stress: Not on file  Social Connections: Not on file  Intimate Partner Violence: Not on file      PHYSICAL EXAM  There were no vitals filed for this visit.  There is no height or weight on file to calculate BMI.  Generalized: Well developed, in no acute distress  Chest: Lungs clear to auscultation bilaterally  Neurological examination  Mentation: Alert oriented to time, place, history taking. Follows all commands speech and language fluent Cranial nerve II-XII: Extraocular movements were full, visual field were full on confrontational test Head turning and shoulder shrug  were normal and symmetric. Motor: The motor testing reveals 5 over 5 strength of all 4 extremities. Good symmetric motor tone is noted throughout.  Sensory: Sensory testing is intact to soft touch on all 4 extremities. No evidence of extinction is noted.  Gait and station: Gait is normal.    DIAGNOSTIC DATA (LABS, IMAGING, TESTING) - I reviewed patient records, labs, notes, testing and imaging myself where available.  Lab Results  Component Value Date   WBC 8.0 05/07/2021   HGB 13.8 05/07/2021   HCT 42.3 05/07/2021   MCV 93.2 05/07/2021   PLT 174 05/07/2021      Component Value Date/Time   NA  139 05/07/2021 1437   NA 140 11/29/2014 0839   K 3.8 05/07/2021 1437   K 4.4 11/29/2014 0839   CL 103 05/07/2021 1437   CL 104 04/29/2012 1145   CO2 28 05/07/2021 1437   CO2 28 11/29/2014 0839   GLUCOSE 112 (H) 05/07/2021 1437   GLUCOSE 121 11/29/2014 0839   GLUCOSE 93 04/29/2012 1145   BUN 12 05/07/2021 1437   BUN 13.5 11/29/2014 0839   CREATININE 0.59 05/07/2021 1437   CREATININE 0.8 11/29/2014 0839   CALCIUM 9.7 05/07/2021 1437   CALCIUM 9.1 11/29/2014 0839   PROT 7.0 05/07/2021 1437   PROT 7.4 11/29/2014 0839   ALBUMIN 3.6 05/07/2021 1437   ALBUMIN 3.4 (L) 11/29/2014 0839   AST 23 05/07/2021 1437   AST 19 11/29/2014 0839   ALT 26 05/07/2021 1437   ALT 24 11/29/2014 0839   ALKPHOS 61 05/07/2021 1437   ALKPHOS 84 11/29/2014 0839   BILITOT 0.5 05/07/2021 1437   BILITOT 0.42 11/29/2014 0839   GFRNONAA >60 05/07/2021 1437   GFRAA >60 11/28/2015 0844    Lab Results  Component Value Date   TSH 2.500 05/24/2014      ASSESSMENT AND PLAN 81 y.o. year old female  has a past medical history of Anxiety, Arthritis, Cancer (HCC), Cataracts, bilateral, Chronic back pain, Family history of adverse reaction to anesthesia, History of blood transfusion, History of bronchitis (05/2015), History of colon polyps, Hyperlipidemia, Hypertension, Hypothyroidism, Ischemic colitis (HCC), Joint pain, Joint swelling, OSA on CPAP, PE (pulmonary embolism), Peripheral edema, Pneumonia (1992), Sarcoidosis, Shortness of breath, Urinary frequency,  Weakness, and Wheeze. here with:  OSA on CPAP  - CPAP compliance excellent - Good treatment of AHI  - Encourage patient to use CPAP nightly and > 4 hours each night -  Mask refitting - F/U in 1 year or sooner if needed     Butch Penny, MSN, NP-C 12/19/2021, 11:34 AM [facility] [address], [address] [place], [place] [address] [phone]

## 2021-12-19 ENCOUNTER — Encounter: Payer: Self-pay | Admitting: Adult Health

## 2021-12-19 ENCOUNTER — Ambulatory Visit: Payer: Medicare HMO | Admitting: Adult Health

## 2021-12-19 VITALS — BP 155/90 | HR 87 | Ht 64.0 in | Wt 252.0 lb

## 2021-12-19 DIAGNOSIS — G4733 Obstructive sleep apnea (adult) (pediatric): Secondary | ICD-10-CM

## 2021-12-19 DIAGNOSIS — Z9989 Dependence on other enabling machines and devices: Secondary | ICD-10-CM

## 2021-12-23 ENCOUNTER — Telehealth: Payer: Self-pay | Admitting: *Deleted

## 2021-12-23 NOTE — Telephone Encounter (Signed)
Towanda Octave, RN; Marchelle Gearing got it        Previous Messages    ----- Message -----  From: Brandon Melnick, RN  Sent: 12/23/2021   7:08 AM EDT  To: Vanessa Ralphs; Marchelle Gearing   New order in Epic.   Mask refitting.   Kennyth Lose  Female, 81 y.o., 1940/07/03  MRN:  [id number]  [place]

## 2021-12-25 DIAGNOSIS — G4733 Obstructive sleep apnea (adult) (pediatric): Secondary | ICD-10-CM | POA: Diagnosis not present

## 2021-12-26 DIAGNOSIS — G4733 Obstructive sleep apnea (adult) (pediatric): Secondary | ICD-10-CM | POA: Diagnosis not present

## 2021-12-30 ENCOUNTER — Ambulatory Visit: Payer: Medicare HMO | Admitting: Adult Health

## 2022-01-22 DIAGNOSIS — Z7901 Long term (current) use of anticoagulants: Secondary | ICD-10-CM | POA: Diagnosis not present

## 2022-01-22 DIAGNOSIS — I80209 Phlebitis and thrombophlebitis of unspecified deep vessels of unspecified lower extremity: Secondary | ICD-10-CM | POA: Diagnosis not present

## 2022-01-24 ENCOUNTER — Encounter (INDEPENDENT_AMBULATORY_CARE_PROVIDER_SITE_OTHER): Payer: Medicare HMO | Admitting: Ophthalmology

## 2022-01-24 DIAGNOSIS — H353132 Nonexudative age-related macular degeneration, bilateral, intermediate dry stage: Secondary | ICD-10-CM

## 2022-01-24 DIAGNOSIS — H33301 Unspecified retinal break, right eye: Secondary | ICD-10-CM

## 2022-01-24 DIAGNOSIS — H35033 Hypertensive retinopathy, bilateral: Secondary | ICD-10-CM | POA: Diagnosis not present

## 2022-01-24 DIAGNOSIS — H43813 Vitreous degeneration, bilateral: Secondary | ICD-10-CM | POA: Diagnosis not present

## 2022-01-24 DIAGNOSIS — I10 Essential (primary) hypertension: Secondary | ICD-10-CM | POA: Diagnosis not present

## 2022-01-25 DIAGNOSIS — G4733 Obstructive sleep apnea (adult) (pediatric): Secondary | ICD-10-CM | POA: Diagnosis not present

## 2022-02-19 DIAGNOSIS — G4733 Obstructive sleep apnea (adult) (pediatric): Secondary | ICD-10-CM | POA: Diagnosis not present

## 2022-02-25 DIAGNOSIS — G4733 Obstructive sleep apnea (adult) (pediatric): Secondary | ICD-10-CM | POA: Diagnosis not present

## 2022-02-27 DIAGNOSIS — I80209 Phlebitis and thrombophlebitis of unspecified deep vessels of unspecified lower extremity: Secondary | ICD-10-CM | POA: Diagnosis not present

## 2022-02-27 DIAGNOSIS — Z7901 Long term (current) use of anticoagulants: Secondary | ICD-10-CM | POA: Diagnosis not present

## 2022-02-27 DIAGNOSIS — D689 Coagulation defect, unspecified: Secondary | ICD-10-CM | POA: Diagnosis not present

## 2022-03-21 DIAGNOSIS — G4733 Obstructive sleep apnea (adult) (pediatric): Secondary | ICD-10-CM | POA: Diagnosis not present

## 2022-03-27 DIAGNOSIS — G4733 Obstructive sleep apnea (adult) (pediatric): Secondary | ICD-10-CM | POA: Diagnosis not present

## 2022-04-01 DIAGNOSIS — Z86711 Personal history of pulmonary embolism: Secondary | ICD-10-CM | POA: Diagnosis not present

## 2022-04-01 DIAGNOSIS — I80209 Phlebitis and thrombophlebitis of unspecified deep vessels of unspecified lower extremity: Secondary | ICD-10-CM | POA: Diagnosis not present

## 2022-04-01 DIAGNOSIS — Z7901 Long term (current) use of anticoagulants: Secondary | ICD-10-CM | POA: Diagnosis not present

## 2022-04-08 DIAGNOSIS — G4733 Obstructive sleep apnea (adult) (pediatric): Secondary | ICD-10-CM | POA: Diagnosis not present

## 2022-04-21 DIAGNOSIS — G4733 Obstructive sleep apnea (adult) (pediatric): Secondary | ICD-10-CM | POA: Diagnosis not present

## 2022-04-27 DIAGNOSIS — G4733 Obstructive sleep apnea (adult) (pediatric): Secondary | ICD-10-CM | POA: Diagnosis not present

## 2022-05-05 DIAGNOSIS — E890 Postprocedural hypothyroidism: Secondary | ICD-10-CM | POA: Diagnosis not present

## 2022-05-05 DIAGNOSIS — R7989 Other specified abnormal findings of blood chemistry: Secondary | ICD-10-CM | POA: Diagnosis not present

## 2022-05-05 DIAGNOSIS — I10 Essential (primary) hypertension: Secondary | ICD-10-CM | POA: Diagnosis not present

## 2022-05-05 DIAGNOSIS — E782 Mixed hyperlipidemia: Secondary | ICD-10-CM | POA: Diagnosis not present

## 2022-05-05 DIAGNOSIS — E559 Vitamin D deficiency, unspecified: Secondary | ICD-10-CM | POA: Diagnosis not present

## 2022-05-08 DIAGNOSIS — I80209 Phlebitis and thrombophlebitis of unspecified deep vessels of unspecified lower extremity: Secondary | ICD-10-CM | POA: Diagnosis not present

## 2022-05-08 DIAGNOSIS — Z7901 Long term (current) use of anticoagulants: Secondary | ICD-10-CM | POA: Diagnosis not present

## 2022-05-08 DIAGNOSIS — Z86711 Personal history of pulmonary embolism: Secondary | ICD-10-CM | POA: Diagnosis not present

## 2022-05-12 DIAGNOSIS — E559 Vitamin D deficiency, unspecified: Secondary | ICD-10-CM | POA: Diagnosis not present

## 2022-05-12 DIAGNOSIS — E890 Postprocedural hypothyroidism: Secondary | ICD-10-CM | POA: Diagnosis not present

## 2022-05-12 DIAGNOSIS — Z7901 Long term (current) use of anticoagulants: Secondary | ICD-10-CM | POA: Diagnosis not present

## 2022-05-12 DIAGNOSIS — R1011 Right upper quadrant pain: Secondary | ICD-10-CM | POA: Diagnosis not present

## 2022-05-12 DIAGNOSIS — Z1339 Encounter for screening examination for other mental health and behavioral disorders: Secondary | ICD-10-CM | POA: Diagnosis not present

## 2022-05-12 DIAGNOSIS — E782 Mixed hyperlipidemia: Secondary | ICD-10-CM | POA: Diagnosis not present

## 2022-05-12 DIAGNOSIS — I739 Peripheral vascular disease, unspecified: Secondary | ICD-10-CM | POA: Diagnosis not present

## 2022-05-12 DIAGNOSIS — R82998 Other abnormal findings in urine: Secondary | ICD-10-CM | POA: Diagnosis not present

## 2022-05-12 DIAGNOSIS — I872 Venous insufficiency (chronic) (peripheral): Secondary | ICD-10-CM | POA: Diagnosis not present

## 2022-05-12 DIAGNOSIS — G4733 Obstructive sleep apnea (adult) (pediatric): Secondary | ICD-10-CM | POA: Diagnosis not present

## 2022-05-12 DIAGNOSIS — Z86711 Personal history of pulmonary embolism: Secondary | ICD-10-CM | POA: Diagnosis not present

## 2022-05-12 DIAGNOSIS — I10 Essential (primary) hypertension: Secondary | ICD-10-CM | POA: Diagnosis not present

## 2022-05-12 DIAGNOSIS — Z1331 Encounter for screening for depression: Secondary | ICD-10-CM | POA: Diagnosis not present

## 2022-05-12 DIAGNOSIS — Z0000 Encounter for general adult medical examination without abnormal findings: Secondary | ICD-10-CM | POA: Diagnosis not present

## 2022-05-13 ENCOUNTER — Other Ambulatory Visit: Payer: Self-pay | Admitting: Internal Medicine

## 2022-05-13 DIAGNOSIS — G8929 Other chronic pain: Secondary | ICD-10-CM

## 2022-05-27 DIAGNOSIS — G4733 Obstructive sleep apnea (adult) (pediatric): Secondary | ICD-10-CM | POA: Diagnosis not present

## 2022-05-28 ENCOUNTER — Ambulatory Visit
Admission: RE | Admit: 2022-05-28 | Discharge: 2022-05-28 | Disposition: A | Discharge status: Home / Self Care | Payer: Medicare HMO | Source: Ambulatory Visit | Attending: Internal Medicine | Admitting: Internal Medicine

## 2022-05-28 DIAGNOSIS — Z9049 Acquired absence of other specified parts of digestive tract: Secondary | ICD-10-CM | POA: Diagnosis not present

## 2022-05-28 DIAGNOSIS — G8929 Other chronic pain: Secondary | ICD-10-CM

## 2022-05-28 DIAGNOSIS — R1011 Right upper quadrant pain: Secondary | ICD-10-CM | POA: Diagnosis not present

## 2022-06-11 DIAGNOSIS — Z86711 Personal history of pulmonary embolism: Secondary | ICD-10-CM | POA: Diagnosis not present

## 2022-06-11 DIAGNOSIS — I80209 Phlebitis and thrombophlebitis of unspecified deep vessels of unspecified lower extremity: Secondary | ICD-10-CM | POA: Diagnosis not present

## 2022-06-11 DIAGNOSIS — Z7901 Long term (current) use of anticoagulants: Secondary | ICD-10-CM | POA: Diagnosis not present

## 2022-06-27 DIAGNOSIS — G4733 Obstructive sleep apnea (adult) (pediatric): Secondary | ICD-10-CM | POA: Diagnosis not present

## 2022-06-30 DIAGNOSIS — I8310 Varicose veins of unspecified lower extremity with inflammation: Secondary | ICD-10-CM | POA: Diagnosis not present

## 2022-06-30 DIAGNOSIS — L97921 Non-pressure chronic ulcer of unspecified part of left lower leg limited to breakdown of skin: Secondary | ICD-10-CM | POA: Diagnosis not present

## 2022-07-07 DIAGNOSIS — I8310 Varicose veins of unspecified lower extremity with inflammation: Secondary | ICD-10-CM | POA: Diagnosis not present

## 2022-07-10 DIAGNOSIS — G4733 Obstructive sleep apnea (adult) (pediatric): Secondary | ICD-10-CM | POA: Diagnosis not present

## 2022-07-16 DIAGNOSIS — Z86711 Personal history of pulmonary embolism: Secondary | ICD-10-CM | POA: Diagnosis not present

## 2022-07-16 DIAGNOSIS — I80209 Phlebitis and thrombophlebitis of unspecified deep vessels of unspecified lower extremity: Secondary | ICD-10-CM | POA: Diagnosis not present

## 2022-07-16 DIAGNOSIS — Z7901 Long term (current) use of anticoagulants: Secondary | ICD-10-CM | POA: Diagnosis not present

## 2022-08-04 DIAGNOSIS — I8310 Varicose veins of unspecified lower extremity with inflammation: Secondary | ICD-10-CM | POA: Diagnosis not present

## 2022-08-04 DIAGNOSIS — R2241 Localized swelling, mass and lump, right lower limb: Secondary | ICD-10-CM | POA: Diagnosis not present

## 2022-08-08 DIAGNOSIS — G4733 Obstructive sleep apnea (adult) (pediatric): Secondary | ICD-10-CM | POA: Diagnosis not present

## 2022-08-11 DIAGNOSIS — I8310 Varicose veins of unspecified lower extremity with inflammation: Secondary | ICD-10-CM | POA: Diagnosis not present

## 2022-08-13 ENCOUNTER — Encounter: Payer: Self-pay | Admitting: Adult Health

## 2022-08-13 ENCOUNTER — Ambulatory Visit: Payer: Medicare HMO | Admitting: Adult Health

## 2022-08-13 VITALS — BP 140/82 | HR 76 | Ht 62.0 in | Wt 245.8 lb

## 2022-08-13 DIAGNOSIS — G4733 Obstructive sleep apnea (adult) (pediatric): Secondary | ICD-10-CM | POA: Diagnosis not present

## 2022-08-13 NOTE — Patient Instructions (Addendum)
Continue using CPAP nightly and greater than 4 hours each night Order sent for mask refitting If your symptoms worsen or you develop new symptoms please let us know.

## 2022-08-13 NOTE — Progress Notes (Signed)
PATIENT: [name] DOB: 1941/05/22  REASON FOR VISIT: follow up HISTORY FROM: patient  Chief Complaint  Patient presents with   Follow-up    Pt in 19 Pt here for CPAP f/u  Pt states her mask doesn't fit well Pt states mask comes off during the night      HISTORY OF PRESENT ILLNESS: Today 08/13/22:  [name] is a 82 y.o. female with a history of OSA on CPAP. Returns today for follow-up. Reports that CPAP works well.  Reports that the mask is leaking.  She does not feel it but she does state that her machine gives her red face each morning.  Her download is below.     12/19/21: [name] is an 82 year old female with a history of obstructive sleep apnea on CPAP.  She received a new machine.  Her download is below.  She does state that she has a fullface mask but does not feel that it fits appropriately.    08/06/21: [name] is an 82 year old female with a history of obstructive sleep apnea on CPAP.  Her download indicates that she used her machine nightly for compliance of 100%.  She used her machine greater than 4 hours each night.  On average she uses her machine 5 hours and 49 minutes.  Her residual AHI is 1 on 10.6 cm of water with EPR 3.  Leak in the 95th percentile is 45.4 L/min  08/06/20: [name] is an 82 year old female with a history of obstructive sleep apnea on CPAP.  She reports that the CPAP works well for her.  She has the nasal pillows and reports that sometime during the night she will open her mouth to breathe.  Her mouth becomes very dry.  She is open to trying a different mask.  Her download is below.  Compliance Report Usage 07/07/2020 - 08/05/2020 Usage days 30/30 days (100%) >= 4 hours 30 days (100%) Average usage (days used) 5 hours 47 minutes  AirSense 10 AutoSet Serial number [id number] Mode CPAP Set pressure 10.6 cmH2O EPR Fulltime EPR level 3 Therapy Leaks - L/min Median: 23.4 95th percentile: 42.3  Maximum: 64.8 Events per hour AI: 0.5 HI: 0.2 AHI: 0.7 Apnea Index Central: 0.0 Obstructive: 0.4 Unknown: 0.1    REVIEW OF SYSTEMS: Out of a complete 14 system review of symptoms, the patient complains only of the following symptoms, and all other reviewed systems are negative.   ESS 6  ALLERGIES: Allergies  Allergen Reactions   Codeine Other (See Comments)    Causes hyperactivity   Erythromycin Itching and Other (See Comments)    Also with jitters   Tetracyclines & Related Itching    Also with jitters   Statins Other (See Comments)    Leg muscle weakness   Atenolol     Other reaction(s): Depression   Citalopram Hydrobromide     Other reaction(s): Nausea/Fatigue   Coenzyme Q10     Other reaction(s): Itching   Losartan Potassium     Other reaction(s): muscle aches   Latex Rash    HOME MEDICATIONS: Outpatient Medications Prior to Visit  Medication Sig Dispense Refill   acetaminophen (TYLENOL) 500 MG tablet Take 1,000-1,500 mg by mouth as needed (back pain).     calcium carbonate (TUMS - DOSED IN MG ELEMENTAL CALCIUM) 500 MG chewable tablet as needed.     Carboxymethylcellulose Sodium (REFRESH LIQUIGEL OP) Place 1 drop into both eyes at bedtime.     diltiazem (TIAZAC) 360  MG 24 hr capsule Take 1 capsule by mouth daily.     ergocalciferol (VITAMIN D2) 1.25 MG (50000 UT) capsule Take 50,000 Units by mouth once a week. Patient states she doesn't take weekly like prescribed     fluticasone (FLONASE) 50 MCG/ACT nasal spray Place 1 spray into the nose as needed for allergies. For allergies.     irbesartan-hydrochlorothiazide (AVALIDE) 300-12.5 MG tablet Take 0.5 tablets by mouth daily. 45 tablet 3   levothyroxine (SYNTHROID, LEVOTHROID) 100 MCG tablet Take 100 mcg by mouth daily before breakfast.   1   LORazepam (ATIVAN) 1 MG tablet Take 1 mg by mouth as needed for anxiety.     Lutein-Zeaxanthin 25-5 MG CAPS Take 1 tablet by mouth daily. EYE VITAMIN     SIMPLY SALINE NA Place  into the nose.     triamcinolone cream (KENALOG) 0.1 % Apply topically 2 (two) times daily.     warfarin (COUMADIN) 5 MG tablet Take 5 mg by mouth daily.  2   ipratropium (ATROVENT) 0.03 % nasal spray take 1-2 sprays into each nostril up to 3 times daily as needed for ccongestion (Patient not taking: Reported on 12/19/2021)     mupirocin ointment (BACTROBAN) 2 % Apply topically 2 (two) times daily.     No facility-administered medications prior to visit.    PAST MEDICAL HISTORY: Past Medical History:  Diagnosis Date   Anxiety    takes Lorazepam daily as needed   Arthritis    Cancer ([place])    thyroid   Cataracts, bilateral    immature   Chronic back pain    compressed vertebrae,takes Fosamax weekly   Family history of adverse reaction to anesthesia    granddaughter gets very sick and mean   History of blood transfusion    no abnormal reaction noted   History of bronchitis 05/2015   History of colon polyps    benign   Hyperlipidemia    takes Zetia daily   Hypertension    takes Diltiazem and Avapro daily   Hypothyroidism    takes Synthroid daily   Ischemic colitis ([place])    hx of-many yrs ago   Joint pain    Joint swelling    OSA on CPAP    05-08-12 AHi was 46, RDI  53. titrated to   9 cm water  with 3 cm flex.-nadir 75%   PE (pulmonary embolism)    takes Coumadin daily   Peripheral edema    Pneumonia 1992   hx of   Sarcoidosis    Shortness of breath    Urinary frequency    Weakness    left leg.Numbness and tingling.Has sciatica   Wheeze    occaionally. Not new    PAST SURGICAL HISTORY: Past Surgical History:  Procedure Laterality Date   ABDOMINAL HYSTERECTOMY     APPENDECTOMY     BREAST BIOPSY     biopsies on right x 2   BREAST CYST ASPIRATION Left    CHOLECYSTECTOMY N/A 11/29/2015   Procedure: LAPAROSCOPIC CHOLECYSTECTOMY;  Surgeon: [name], MD;  Location: [facility];  Service: General;  Laterality: N/A;   COLONOSCOPY     DILATION AND CURETTAGE OF UTERUS      LIVER BIOPSY     MELANOMA EXCISION     removed from left leg   scalene surgery  1972   "trying to find out what was wrong with her lungs"   THYROID SURGERY      FAMILY HISTORY: Family History  Problem Relation Age of Onset   Breast cancer Mother    CVA Mother    Breast cancer Sister    Breast cancer Sister    Heart attack Neg Hx    Sleep apnea Neg Hx     SOCIAL HISTORY: Social History   Socioeconomic History   Marital status: Married    Spouse name: Not on file   Number of children: Not on file   Years of education: Not on file   Highest education level: Not on file  Occupational History   Occupation: Retired    Fish farm manager: RETIRED  Tobacco Use   Smoking status: Never   Smokeless tobacco: Never  Vaping Use   Vaping Use: Never used  Substance and Sexual Activity   Alcohol use: No   Drug use: No   Sexual activity: Not on file  Other Topics Concern   Not on file  Social History Narrative   Caffeine: 8 oz soda per day   Social Determinants of Health   Financial Resource Strain: Not on file  Food Insecurity: Not on file  Transportation Needs: Not on file  Physical Activity: Not on file  Stress: Not on file  Social Connections: Not on file  Intimate Partner Violence: Not on file      PHYSICAL EXAM  Vitals:   08/13/22 0814  BP: (!) 140/82  Pulse: 76  Weight: 245 lb 12.8 oz (111.5 kg)  Height: '5\' 2"'$  (1.575 m)    Body mass index is 44.96 kg/m.  Generalized: Well developed, in no acute distress  Chest: Lungs clear to auscultation bilaterally  Neurological examination  Mentation: Alert oriented to time, place, history taking. Follows all commands speech and language fluent Cranial nerve II-XII: Extraocular movements were full, visual field were full on confrontational test Head turning and shoulder shrug  were normal and symmetric. Motor: The motor testing reveals 5 over 5 strength of all 4 extremities. Good symmetric motor tone is noted throughout.   Sensory: Sensory testing is intact to soft touch on all 4 extremities. No evidence of extinction is noted.  Gait and station: Gait is normal.    DIAGNOSTIC DATA (LABS, IMAGING, TESTING) - I reviewed patient records, labs, notes, testing and imaging myself where available.  Lab Results  Component Value Date   WBC 8.0 05/07/2021   HGB 13.8 05/07/2021   HCT 42.3 05/07/2021   MCV 93.2 05/07/2021   PLT 174 05/07/2021      Component Value Date/Time   NA 139 05/07/2021 1437   NA 140 11/29/2014 0839   K 3.8 05/07/2021 1437   K 4.4 11/29/2014 0839   CL 103 05/07/2021 1437   CL 104 04/29/2012 1145   CO2 28 05/07/2021 1437   CO2 28 11/29/2014 0839   GLUCOSE 112 (H) 05/07/2021 1437   GLUCOSE 121 11/29/2014 0839   GLUCOSE 93 04/29/2012 1145   BUN 12 05/07/2021 1437   BUN 13.5 11/29/2014 0839   CREATININE 0.59 05/07/2021 1437   CREATININE 0.8 11/29/2014 0839   CALCIUM 9.7 05/07/2021 1437   CALCIUM 9.1 11/29/2014 0839   PROT 7.0 05/07/2021 1437   PROT 7.4 11/29/2014 0839   ALBUMIN 3.6 05/07/2021 1437   ALBUMIN 3.4 (L) 11/29/2014 0839   AST 23 05/07/2021 1437   AST 19 11/29/2014 0839   ALT 26 05/07/2021 1437   ALT 24 11/29/2014 0839   ALKPHOS 61 05/07/2021 1437   ALKPHOS 84 11/29/2014 0839   BILITOT 0.5 05/07/2021 1437   BILITOT 0.42  11/29/2014 0839   GFRNONAA >60 05/07/2021 1437   GFRAA >60 11/28/2015 0844    Lab Results  Component Value Date   TSH 2.500 05/24/2014      ASSESSMENT AND PLAN 82 y.o. year old female  has a past medical history of Anxiety, Arthritis, Cancer ([place]), Cataracts, bilateral, Chronic back pain, Family history of adverse reaction to anesthesia, History of blood transfusion, History of bronchitis (05/2015), History of colon polyps, Hyperlipidemia, Hypertension, Hypothyroidism, Ischemic colitis ([place]), Joint pain, Joint swelling, OSA on CPAP, PE (pulmonary embolism), Peripheral edema, Pneumonia (1992), Sarcoidosis, Shortness of breath, Urinary  frequency, Weakness, and Wheeze. here with:  OSA on CPAP  - CPAP compliance excellent - Good treatment of AHI  - Encourage patient to use CPAP nightly and > 4 hours each night -  order sent for Mask refitting - F/U in 1 year or sooner if needed     Ward Givens, MSN, NP-C 08/13/2022, 8:26 AM [facility] [address], [place], [place] [address] [phone]

## 2022-08-15 ENCOUNTER — Ambulatory Visit
Admission: EM | Admit: 2022-08-15 | Discharge: 2022-08-15 | Disposition: A | Discharge status: Home / Self Care | Payer: Medicare HMO | Attending: Urgent Care | Admitting: Urgent Care

## 2022-08-15 ENCOUNTER — Encounter: Payer: Self-pay | Admitting: Emergency Medicine

## 2022-08-15 DIAGNOSIS — L03115 Cellulitis of right lower limb: Secondary | ICD-10-CM

## 2022-08-15 DIAGNOSIS — R600 Localized edema: Secondary | ICD-10-CM | POA: Diagnosis not present

## 2022-08-15 DIAGNOSIS — R609 Edema, unspecified: Secondary | ICD-10-CM | POA: Diagnosis not present

## 2022-08-15 LAB — CBC WITH DIFFERENTIAL/PLATELET
Basophils Absolute: 0 10*3/uL (ref 0.0–0.2)
Basos: 0 %
EOS (ABSOLUTE): 0 10*3/uL (ref 0.0–0.4)
Eos: 0 %
Hematocrit: 43 % (ref 34.0–46.6)
Hemoglobin: 14.4 g/dL (ref 11.1–15.9)
Immature Granulocytes: 0 %
Lymphocytes Absolute: 1.6 10*3/uL (ref 0.7–3.1)
Lymphs: 9 %
MCH: 30.7 pg (ref 26.6–33.0)
MCHC: 33.5 g/dL (ref 31.5–35.7)
MCV: 92 fL (ref 79–97)
Monocytes Absolute: 1.6 10*3/uL — ABNORMAL HIGH (ref 0.1–0.9)
Monocytes: 8 %
Neutrophils Absolute: 15.3 10*3/uL — ABNORMAL HIGH (ref 1.4–7.0)
Neutrophils: 83 %
Platelets: 213 10*3/uL (ref 150–450)
RBC: 4.69 x10E6/uL (ref 3.77–5.28)
RDW: 14.5 % (ref 11.7–15.4)
WBC: 18.6 10*3/uL — ABNORMAL HIGH (ref 3.4–10.8)

## 2022-08-15 LAB — COMPREHENSIVE METABOLIC PANEL
ALT: 34 IU/L — ABNORMAL HIGH (ref 0–32)
AST: 30 IU/L (ref 0–40)
Albumin/Globulin Ratio: 0.9 — ABNORMAL LOW (ref 1.2–2.2)
Albumin: 3.4 g/dL — ABNORMAL LOW (ref 3.7–4.7)
Alkaline Phosphatase: 97 IU/L (ref 44–121)
BUN/Creatinine Ratio: 22 (ref 12–28)
BUN: 16 mg/dL (ref 8–27)
Bilirubin Total: 1 mg/dL (ref 0.0–1.2)
CO2: 26 mmol/L (ref 20–29)
Calcium: 8.7 mg/dL (ref 8.7–10.3)
Chloride: 98 mmol/L (ref 96–106)
Creatinine, Ser: 0.73 mg/dL (ref 0.57–1.00)
Globulin, Total: 3.9 g/dL (ref 1.5–4.5)
Glucose: 105 mg/dL — ABNORMAL HIGH (ref 70–99)
Potassium: 3.6 mmol/L (ref 3.5–5.2)
Sodium: 133 mmol/L — ABNORMAL LOW (ref 134–144)
Total Protein: 7.3 g/dL (ref 6.0–8.5)
eGFR: 82 mL/min/{1.73_m2} (ref >59)

## 2022-08-15 MED ORDER — CLINDAMYCIN HCL 300 MG PO CAPS: 300.0000 mg | ORAL_CAPSULE | Freq: Three times a day (TID) | ORAL | 0 refills | Status: DC

## 2022-08-15 MED ORDER — CEFTRIAXONE SODIUM 1 G IJ SOLR: 1000.0000 mg | Freq: Once | INTRAMUSCULAR | Status: DC

## 2022-08-15 NOTE — ED Triage Notes (Signed)
Pt c/o right leg swelling and redness over the last couple weeks but worse in the last 2 days. States she had a 101 fever this am. Took tylenol at 8am. States leg is uncomfortable but not very painful.

## 2022-08-15 NOTE — Discharge Instructions (Addendum)
Please start taking clindamycin 3 times daily for 10 days.  This is an antibiotic to address cellulitis of your right leg.  Make sure you are elevating your leg to help with the swelling.  The blood work results will let me know if we need to have you go to the emergency room.  I will have those later today. If they are significantly abnormal I will have to redirect you to the emergency room.

## 2022-08-15 NOTE — ED Provider Notes (Signed)
[facility]  Note:  This document was prepared using Dragon voice recognition software and may include unintentional dictation errors.  MRN: [id number] DOB: [birth year]  Subjective:   [name] is a 82 y.o. female with significant pmh of PE, DVT presenting for 3 day history of acute onset recurrent substantial swelling with redness and mild tenderness of the right leg from the thigh down to the ankle and foot.  Symptoms actually started 08/04/2022.  Was seen by her regular doctors office, had an ultrasound done which confirmed she did not have a DVT.  Results reviewed as below.  She was given a steroid injection.  She had some improvement until this week.  No chest pain, shortness of breath, wheezing.  No history of CHF.  Patient does have peripheral edema.  She is on warfarin, INR levels confirmed to have been within range.  Last measurement was 2.0 from last month.  No current facility-administered medications for this encounter.  Current Outpatient Medications:    acetaminophen (TYLENOL) 500 MG tablet, Take 1,000-1,500 mg by mouth as needed (back pain)., Disp: , Rfl:    calcium carbonate (TUMS - DOSED IN MG ELEMENTAL CALCIUM) 500 MG chewable tablet, as needed., Disp: , Rfl:    Carboxymethylcellulose Sodium (REFRESH LIQUIGEL OP), Place 1 drop into both eyes at bedtime., Disp: , Rfl:    diltiazem (TIAZAC) 360 MG 24 hr capsule, Take 1 capsule by mouth daily., Disp: , Rfl:    ergocalciferol (VITAMIN D2) 1.25 MG (50000 UT) capsule, Take 50,000 Units by mouth once a week. Patient states she doesn't take weekly like prescribed, Disp: , Rfl:    fluticasone (FLONASE) 50 MCG/ACT nasal spray, Place 1 spray into the nose as needed for allergies. For allergies., Disp: , Rfl:    ipratropium (ATROVENT) 0.03 % nasal spray, take 1-2 sprays into each nostril up to 3 times daily as needed for ccongestion (Patient not taking: Reported on 12/19/2021), Disp: , Rfl:     irbesartan-hydrochlorothiazide (AVALIDE) 300-12.5 MG tablet, Take 0.5 tablets by mouth daily., Disp: 45 tablet, Rfl: 3   levothyroxine (SYNTHROID, LEVOTHROID) 100 MCG tablet, Take 100 mcg by mouth daily before breakfast. , Disp: , Rfl: 1   LORazepam (ATIVAN) 1 MG tablet, Take 1 mg by mouth as needed for anxiety., Disp: , Rfl:    Lutein-Zeaxanthin 25-5 MG CAPS, Take 1 tablet by mouth daily. EYE VITAMIN, Disp: , Rfl:    mupirocin ointment (BACTROBAN) 2 %, Apply topically 2 (two) times daily., Disp: , Rfl:    SIMPLY SALINE NA, Place into the nose., Disp: , Rfl:    triamcinolone cream (KENALOG) 0.1 %, Apply topically 2 (two) times daily., Disp: , Rfl:    warfarin (COUMADIN) 5 MG tablet, Take 5 mg by mouth daily., Disp: , Rfl: 2   Allergies  Allergen Reactions   Codeine Other (See Comments)    Causes hyperactivity   Erythromycin Itching and Other (See Comments)    Also with jitters   Tetracyclines & Related Itching    Also with jitters   Statins Other (See Comments)    Leg muscle weakness   Atenolol     Other reaction(s): Depression   Citalopram Hydrobromide     Other reaction(s): Nausea/Fatigue   Coenzyme Q10     Other reaction(s): Itching   Losartan Potassium     Other reaction(s): muscle aches   Latex Rash    Past Medical History:  Diagnosis Date   Anxiety    takes  Lorazepam daily as needed   Arthritis    Cancer ([place])    thyroid   Cataracts, bilateral    immature   Chronic back pain    compressed vertebrae,takes Fosamax weekly   Family history of adverse reaction to anesthesia    granddaughter gets very sick and mean   History of blood transfusion    no abnormal reaction noted   History of bronchitis 05/2015   History of colon polyps    benign   Hyperlipidemia    takes Zetia daily   Hypertension    takes Diltiazem and Avapro daily   Hypothyroidism    takes Synthroid daily   Ischemic colitis ([place])    hx of-many yrs ago   Joint pain    Joint swelling    OSA on  CPAP    05-08-12 AHi was 46, RDI  53. titrated to   9 cm water  with 3 cm flex.-nadir 75%   PE (pulmonary embolism)    takes Coumadin daily   Peripheral edema    Pneumonia 1992   hx of   Sarcoidosis    Shortness of breath    Urinary frequency    Weakness    left leg.Numbness and tingling.Has sciatica   Wheeze    occaionally. Not new     Past Surgical History:  Procedure Laterality Date   ABDOMINAL HYSTERECTOMY     APPENDECTOMY     BREAST BIOPSY     biopsies on right x 2   BREAST CYST ASPIRATION Left    CHOLECYSTECTOMY N/A 11/29/2015   Procedure: LAPAROSCOPIC CHOLECYSTECTOMY;  Surgeon: [name], MD;  Location: [facility];  Service: General;  Laterality: N/A;   COLONOSCOPY     DILATION AND CURETTAGE OF UTERUS     LIVER BIOPSY     MELANOMA EXCISION     removed from left leg   scalene surgery  1972   "trying to find out what was wrong with her lungs"   THYROID SURGERY      Family History  Problem Relation Age of Onset   Breast cancer Mother    CVA Mother    Breast cancer Sister    Breast cancer Sister    Heart attack Neg Hx    Sleep apnea Neg Hx     Social History   Tobacco Use   Smoking status: Never   Smokeless tobacco: Never  Vaping Use   Vaping Use: Never used  Substance Use Topics   Alcohol use: No   Drug use: No    ROS   Objective:   Vitals: BP (!) 148/80 (BP Location: Right Arm)   Pulse (!) 120   Temp 98.9 F (37.2 C) (Oral)   Resp 18   SpO2 96%   Pulse recheck ranged from 108bpm - 112bpm.  Physical Exam Constitutional:      General: She is not in acute distress.    Appearance: Normal appearance. She is well-developed. She is not ill-appearing, toxic-appearing or diaphoretic.  HENT:     Head: Normocephalic and atraumatic.     Nose: Nose normal.     Mouth/Throat:     Mouth: Mucous membranes are moist.  Eyes:     General: No scleral icterus.       Right eye: No discharge.        Left eye: No discharge.     Extraocular Movements:  Extraocular movements intact.  Cardiovascular:     Rate and Rhythm: Tachycardia present.  Heart sounds: Normal heart sounds. No murmur heard.    No friction rub. No gallop.     Comments: Irregularly irregular rhythm. Pulmonary:     Effort: Pulmonary effort is normal. No respiratory distress.     Breath sounds: No stridor. No wheezing, rhonchi or rales.  Chest:     Chest wall: No tenderness.  Musculoskeletal:        General: Swelling (1+ for right lower extremity with associated warmth and mild tenderness over the posterior lateral right thigh) present.     Right lower leg: Edema present.     Left lower leg: No edema.  Skin:    General: Skin is warm and dry.     Comments: Multiple superficial varicose veins over both lower extremities.  Neurological:     General: No focal deficit present.     Mental Status: She is alert and oriented to person, place, and time.  Psychiatric:        Mood and Affect: Mood normal.        Behavior: Behavior normal.      Imaging Results - US Venous Lower Extremity Right (08/04/2022 2:15 PM EST) Narrative  08/04/2022 2:17 PM EST  This result has an attachment that is not available.  TECHNIQUE: The veins of right lower extremity were interrogated from the visible common femoral vein to the distal popliteal vein.  The junction of the greater saphenous with the common femoral vein as well as the posterior tibial veins were evaluated.  Gray scale, color, spectral, and doppler sonography was utilized. COMPARISON: None.   INDICATION: Stasis eczema swelling.  FINDINGS: Flow is present in all interrogated vessels and there are no internal echoes.  Normal venous compressibility is demonstrated and there is augmentation of flow with appropriate maneuvers.    INCIDENTAL FINDINGS:  None    Assessment and Plan :   PDMP not reviewed this encounter.  1. Cellulitis of leg, right   2. Peripheral edema   3. Localized edema     Case discussed and reviewed  with my medical director, Dr. Lanny Cramp.  Will obtain point-of-care labs.  Primary suspicion is that she has cellulitis and therefore we will start her on clindamycin which would not affect her renal function or require renal dosing.  It also would not interact with warfarin.  Discussed that the labs may dictate whether or not she needs higher level of care than we can provide in the urgent care setting.  Will redirect her to the emergency room as necessary.  Emphasized resting, elevation of her leg.  Will follow-up once I obtain lab results.   Jaynee Eagles, PA-C 08/15/22 1324

## 2022-08-19 LAB — BRAIN NATRIURETIC PEPTIDE: BNP: 95.9 pg/mL (ref 0.0–100.0)

## 2022-08-20 DIAGNOSIS — Z7901 Long term (current) use of anticoagulants: Secondary | ICD-10-CM | POA: Diagnosis not present

## 2022-08-20 DIAGNOSIS — I80209 Phlebitis and thrombophlebitis of unspecified deep vessels of unspecified lower extremity: Secondary | ICD-10-CM | POA: Diagnosis not present

## 2022-08-20 DIAGNOSIS — Z86711 Personal history of pulmonary embolism: Secondary | ICD-10-CM | POA: Diagnosis not present

## 2022-08-25 ENCOUNTER — Telehealth: Payer: Self-pay | Admitting: Interventional Cardiology

## 2022-08-25 NOTE — Telephone Encounter (Signed)
STAT if HR is under 50 or over 120 (normal HR is 60-100 beats per minute)  What is your heart rate?  Have been as  high as 130, 115, 119, now it is 84  Do you have a log of your heart rate readings (document readings)?   Do you have any other symptoms? Feels like anxiety, heart feels like it is aching  - patient wanted to be seen- first available appointment was 09-10-22

## 2022-08-25 NOTE — Telephone Encounter (Signed)
Follow Up:      Patient said to please call after 10:30 this morning. She is going to the doctor with her husband.

## 2022-08-25 NOTE — Telephone Encounter (Signed)
I spoke with patient.  Episodes of elevated heart rate listed below have been over a several week period.  Readings of 115 and 119 were taken at Urgent Care when she was being evaluated for cellulitis.  She is currently on an antibiotic.  She was also running a fever at that time. Did receive a steroid injection at the end of February. Most readings at home have been 60-80.  Current reading is 91.  Patient on coumadin.  Under a lot of stress recently.  Patient concerned as she has not had an EKG for a long time.  Patient reports a "sore heart" feeling the last 2-3 days. She describes as a heaviness in her chest.  She does not know if she has had this feeling before.  She is belching a lot.  Some improvement with as needed anxiety medication.  No shortness of breath.  Appointment made for patient to see Ambrose Pancoast, NP tomorrow at 1:30.  ED precautions reviewed with patient.

## 2022-08-25 NOTE — Progress Notes (Deleted)
Office Visit    Patient Name: [name] Date of Encounter: 08/25/2022  Primary Care Provider:  [name], MD Primary Cardiologist:  Larae Grooms, MD Primary Electrophysiologist: None  Chief Complaint    [name] is a 82 y.o. female with PMH of atrial fibrillation (on Coumadin) DVT/PEHTN, OSA (on CPAP), HLD, obesity who presents today for complaint of chest heaviness and peripheral edema.  Past Medical History    Past Medical History:  Diagnosis Date   Anxiety    takes Lorazepam daily as needed   Arthritis    Cancer ([place])    thyroid   Cataracts, bilateral    immature   Chronic back pain    compressed vertebrae,takes Fosamax weekly   Family history of adverse reaction to anesthesia    granddaughter gets very sick and mean   History of blood transfusion    no abnormal reaction noted   History of bronchitis 05/2015   History of colon polyps    benign   Hyperlipidemia    takes Zetia daily   Hypertension    takes Diltiazem and Avapro daily   Hypothyroidism    takes Synthroid daily   Ischemic colitis ([place])    hx of-many yrs ago   Joint pain    Joint swelling    OSA on CPAP    05-08-12 AHi was 46, RDI  53. titrated to   9 cm water  with 3 cm flex.-nadir 75%   PE (pulmonary embolism)    takes Coumadin daily   Peripheral edema    Pneumonia 1992   hx of   Sarcoidosis    Shortness of breath    Urinary frequency    Weakness    left leg.Numbness and tingling.Has sciatica   Wheeze    occaionally. Not new   Past Surgical History:  Procedure Laterality Date   ABDOMINAL HYSTERECTOMY     APPENDECTOMY     BREAST BIOPSY     biopsies on right x 2   BREAST CYST ASPIRATION Left    CHOLECYSTECTOMY N/A 11/29/2015   Procedure: LAPAROSCOPIC CHOLECYSTECTOMY;  Surgeon: [name], MD;  Location: [facility];  Service: General;  Laterality: N/A;   COLONOSCOPY     DILATION AND CURETTAGE OF UTERUS     LIVER BIOPSY     MELANOMA EXCISION      removed from left leg   scalene surgery  1972   "trying to find out what was wrong with her lungs"   THYROID SURGERY      Allergies  Allergies  Allergen Reactions   Codeine Other (See Comments)    Causes hyperactivity   Erythromycin Itching and Other (See Comments)    Also with jitters   Tetracyclines & Related Itching    Also with jitters   Statins Other (See Comments)    Leg muscle weakness   Atenolol     Other reaction(s): Depression   Citalopram Hydrobromide     Other reaction(s): Nausea/Fatigue   Coenzyme Q10     Other reaction(s): Itching   Losartan Potassium     Other reaction(s): muscle aches   Latex Rash    History of Present Illness    [name]  is a 82 year old female with the above mention past medical history who presents today for complaint of chest heaviness and peripheral edema.  [name] has been followed by Dr. Irish Lack since 2016 by our records.  She had a previous exercise stress  test completed in 2009 that was negative for ischemia.  She was last seen 01/2020 and blood pressure was better controlled with decreased irbesartan/HCTZ.  She was having some complaints of back pain at that time.  She was rate controlled with atrial fibrillation and tolerating Coumadin without any adverse reactions.  She was also noted to have some lower extremity edema and was advised to use compression stockings with possibility of stopping HCTZ and giving Lasix as needed.      Since last being seen in the office patient reports***.  Patient denies chest pain, palpitations, dyspnea, PND, orthopnea, nausea, vomiting, dizziness, syncope, edema, weight gain, or early satiety.     ***Notes: -She completed a lower extremity ultrasound for stasis eczema that revealed no flow concerns -Patient has problem tolerating certain brands of medications chest heaviness Home Medications    Current Outpatient Medications  Medication Sig Dispense Refill   acetaminophen  (TYLENOL) 500 MG tablet Take 1,000-1,500 mg by mouth as needed (back pain).     calcium carbonate (TUMS - DOSED IN MG ELEMENTAL CALCIUM) 500 MG chewable tablet as needed.     Carboxymethylcellulose Sodium (REFRESH LIQUIGEL OP) Place 1 drop into both eyes at bedtime.     clindamycin (CLEOCIN) 300 MG capsule Take 1 capsule (300 mg total) by mouth 3 (three) times daily. 30 capsule 0   diltiazem (TIAZAC) 360 MG 24 hr capsule Take 1 capsule by mouth daily.     ergocalciferol (VITAMIN D2) 1.25 MG (50000 UT) capsule Take 50,000 Units by mouth once a week. Patient states she doesn't take weekly like prescribed     fluticasone (FLONASE) 50 MCG/ACT nasal spray Place 1 spray into the nose as needed for allergies. For allergies.     ipratropium (ATROVENT) 0.03 % nasal spray take 1-2 sprays into each nostril up to 3 times daily as needed for ccongestion (Patient not taking: Reported on 12/19/2021)     irbesartan-hydrochlorothiazide (AVALIDE) 300-12.5 MG tablet Take 0.5 tablets by mouth daily. 45 tablet 3   levothyroxine (SYNTHROID, LEVOTHROID) 100 MCG tablet Take 100 mcg by mouth daily before breakfast.   1   LORazepam (ATIVAN) 1 MG tablet Take 1 mg by mouth as needed for anxiety.     Lutein-Zeaxanthin 25-5 MG CAPS Take 1 tablet by mouth daily. EYE VITAMIN     mupirocin ointment (BACTROBAN) 2 % Apply topically 2 (two) times daily.     SIMPLY SALINE NA Place into the nose.     triamcinolone cream (KENALOG) 0.1 % Apply topically 2 (two) times daily.     warfarin (COUMADIN) 5 MG tablet Take 5 mg by mouth daily.  2   No current facility-administered medications for this visit.     Review of Systems  Please see the history of present illness.    (+)*** (+)***  All other systems reviewed and are otherwise negative except as noted above.  Physical Exam    Wt Readings from Last 3 Encounters:  08/13/22 245 lb 12.8 oz (111.5 kg)  12/19/21 252 lb (114.3 kg)  08/06/21 248 lb 6.4 oz (112.7 kg)   [id number] were  no vitals filed for this visit.,There is no height or weight on file to calculate BMI.  Constitutional:      Appearance: Healthy appearance. Not in distress.  Neck:     Vascular: JVD normal.  Pulmonary:     Effort: Pulmonary effort is normal.     Breath sounds: No wheezing. No rales. Diminished in the bases Cardiovascular:  Normal rate. Regular rhythm. Normal S1. Normal S2.      Murmurs: There is no murmur.  Edema:    Peripheral edema absent.  Abdominal:     Palpations: Abdomen is soft non tender. There is no hepatomegaly.  Skin:    General: Skin is warm and dry.  Neurological:     General: No focal deficit present.     Mental Status: Alert and oriented to person, place and time.     Cranial Nerves: Cranial nerves are intact.  EKG/LABS/ Recent Cardiac Studies    ECG personally reviewed by me today - ***  Risk Assessment/Calculations:   {Does this patient have ATRIAL FIBRILLATION?:[phone]}        Lab Results  Component Value Date   WBC 18.6 (H) 08/15/2022   HGB 14.4 08/15/2022   HCT 43.0 08/15/2022   MCV 92 08/15/2022   PLT 213 08/15/2022   Lab Results  Component Value Date   CREATININE 0.73 08/15/2022   BUN 16 08/15/2022   NA 133 (L) 08/15/2022   K 3.6 08/15/2022   CL 98 08/15/2022   CO2 26 08/15/2022   Lab Results  Component Value Date   ALT 34 (H) 08/15/2022   AST 30 08/15/2022   ALKPHOS 97 08/15/2022   BILITOT 1.0 08/15/2022   No results found for: "CHOL", "HDL", "LDLCALC", "LDLDIRECT", "TRIG", "CHOLHDL"  No results found for: "HGBA1C"  Cardiac Studies & Procedures         MONITORS  LONG TERM MONITOR (3-14 DAYS) 11/26/2018  Narrative  Atrial fibrillation, 100% of the time.  Average HR 86 bpm.  SHe is on anticoagulation for prior DVT.  HR ranges from 44- 146.  Continue current therapy.           Assessment & Plan    1.  Chest heaviness:  2.  Permanent atrial fibrillation  3.  Lower extremity edema:  4.  Essential  hypertension  5.  Hyperlipidemia  6. History of PE/DVT      Disposition: Follow-up with Larae Grooms, MD or APP in *** months {Are you ordering a CV Procedure (e.g. stress test, cath, DCCV, TEE, etc)?   Press F2        :[id number]   Medication Adjustments/Labs and Tests Ordered: Current medicines are reviewed at length with the patient today.  Concerns regarding medicines are outlined above.   Signed, Mable Fill, Marissa Nestle, NP 08/25/2022, 2:58 PM [facility]  Note:  This document was prepared using Dragon voice recognition software and may include unintentional dictation errors.

## 2022-08-26 ENCOUNTER — Ambulatory Visit: Payer: Medicare HMO | Attending: Nurse Practitioner | Admitting: Nurse Practitioner

## 2022-08-26 ENCOUNTER — Encounter: Payer: Self-pay | Admitting: Nurse Practitioner

## 2022-08-26 VITALS — BP 138/80 | HR 90 | Ht 62.0 in | Wt 247.0 lb

## 2022-08-26 DIAGNOSIS — I825Y2 Chronic embolism and thrombosis of unspecified deep veins of left proximal lower extremity: Secondary | ICD-10-CM | POA: Diagnosis not present

## 2022-08-26 DIAGNOSIS — R600 Localized edema: Secondary | ICD-10-CM

## 2022-08-26 DIAGNOSIS — R0789 Other chest pain: Secondary | ICD-10-CM

## 2022-08-26 DIAGNOSIS — I480 Paroxysmal atrial fibrillation: Secondary | ICD-10-CM | POA: Diagnosis not present

## 2022-08-26 DIAGNOSIS — Z86718 Personal history of other venous thrombosis and embolism: Secondary | ICD-10-CM

## 2022-08-26 DIAGNOSIS — I10 Essential (primary) hypertension: Secondary | ICD-10-CM | POA: Diagnosis not present

## 2022-08-26 NOTE — Progress Notes (Signed)
Office Visit    Patient Name: [name] Date of Encounter: 08/26/2022  Primary Care Provider:  [name], MD Primary Cardiologist:  Larae Grooms, MD Primary Electrophysiologist: None  Chief Complaint    [name] is a 82 y.o. female with PMH of atrial fibrillation (on Coumadin) DVT/PE HTN, OSA (on CPAP), HLD, obesity who presents today for complaint of chest heaviness and peripheral edema.   Past Medical History    Past Medical History:  Diagnosis Date   Anxiety    takes Lorazepam daily as needed   Arthritis    Cancer ([place])    thyroid   Cataracts, bilateral    immature   Chronic back pain    compressed vertebrae,takes Fosamax weekly   Family history of adverse reaction to anesthesia    granddaughter gets very sick and mean   History of blood transfusion    no abnormal reaction noted   History of bronchitis 05/2015   History of colon polyps    benign   Hyperlipidemia    takes Zetia daily   Hypertension    takes Diltiazem and Avapro daily   Hypothyroidism    takes Synthroid daily   Ischemic colitis ([place])    hx of-many yrs ago   Joint pain    Joint swelling    OSA on CPAP    05-08-12 AHi was 46, RDI  53. titrated to   9 cm water  with 3 cm flex.-nadir 75%   PE (pulmonary embolism)    takes Coumadin daily   Peripheral edema    Pneumonia 1992   hx of   Sarcoidosis    Shortness of breath    Urinary frequency    Weakness    left leg.Numbness and tingling.Has sciatica   Wheeze    occaionally. Not new   Past Surgical History:  Procedure Laterality Date   ABDOMINAL HYSTERECTOMY     APPENDECTOMY     BREAST BIOPSY     biopsies on right x 2   BREAST CYST ASPIRATION Left    CHOLECYSTECTOMY N/A 11/29/2015   Procedure: LAPAROSCOPIC CHOLECYSTECTOMY;  Surgeon: [name], MD;  Location: [facility];  Service: General;  Laterality: N/A;   COLONOSCOPY     DILATION AND CURETTAGE OF UTERUS     LIVER BIOPSY     MELANOMA EXCISION      removed from left leg   scalene surgery  1972   "trying to find out what was wrong with her lungs"   THYROID SURGERY      Allergies  Allergies  Allergen Reactions   Codeine Other (See Comments)    Causes hyperactivity   Erythromycin Itching and Other (See Comments)    Also with jitters   Tetracyclines & Related Itching    Also with jitters   Statins Other (See Comments)    Leg muscle weakness   Atenolol     Other reaction(s): Depression   Citalopram Hydrobromide     Other reaction(s): Nausea/Fatigue   Coenzyme Q10     Other reaction(s): Itching   Losartan Potassium     Other reaction(s): muscle aches   Latex Rash    History of Present Illness    [name]  is a 82 year old female with the above mention past medical history who presents today for complaint of chest heaviness and peripheral edema.  [name] has been followed by Dr. Irish Lack since 2016 by our records.  She had a previous  exercise stress test completed in 2009 that was negative for ischemia.  She was last seen 01/2020 and blood pressure was better controlled with decreased irbesartan/HCTZ.  She was having some complaints of back pain at that time.  She was rate controlled with atrial fibrillation and tolerating Coumadin without any adverse reactions.  She was also noted to have some lower extremity edema and was advised to use compression stockings with possibility of stopping HCTZ and giving Lasix as needed.   Since last being seen in the office patient reports she is been experiencing some chest heaviness but denies any chest pain.  She has also experienced increased anxiety which she feels is contributing to her chest heaviness.  She reports that her husband was recently diagnosed with prostate cancer and his diagnosis has caused emotional turmoil.  She is tolerating her current medications without any adverse reactions.  Her blood pressure today is controlled at 138/80 and heart rate was 90 bpm.  She  has a bilateral pitting edema +2 in her lower extremities.  She reports that her primary care doctor started her on Lasix recently she has been taking it as needed.  Patient denies chest pain, palpitations, dyspnea, PND, orthopnea, nausea, vomiting, dizziness, syncope, edema, weight gain, or early satiety.    Home Medications    Current Outpatient Medications  Medication Sig Dispense Refill   acetaminophen (TYLENOL) 500 MG tablet Take 1,000-1,500 mg by mouth as needed (back pain).     calcium carbonate (TUMS - DOSED IN MG ELEMENTAL CALCIUM) 500 MG chewable tablet as needed.     Carboxymethylcellulose Sodium (REFRESH LIQUIGEL OP) Place 1 drop into both eyes at bedtime.     clindamycin (CLEOCIN) 300 MG capsule Take 1 capsule (300 mg total) by mouth 3 (three) times daily. 30 capsule 0   diltiazem (TIAZAC) 360 MG 24 hr capsule Take 1 capsule by mouth daily.     ergocalciferol (VITAMIN D2) 1.25 MG (50000 UT) capsule Take 50,000 Units by mouth once a week. Patient states she doesn't take weekly like prescribed     fluticasone (FLONASE) 50 MCG/ACT nasal spray Place 1 spray into the nose as needed for allergies. For allergies.     ipratropium (ATROVENT) 0.03 % nasal spray take 1-2 sprays into each nostril up to 3 times daily as needed for ccongestion (Patient not taking: Reported on 12/19/2021)     irbesartan-hydrochlorothiazide (AVALIDE) 300-12.5 MG tablet Take 0.5 tablets by mouth daily. 45 tablet 3   levothyroxine (SYNTHROID, LEVOTHROID) 100 MCG tablet Take 100 mcg by mouth daily before breakfast.   1   LORazepam (ATIVAN) 1 MG tablet Take 1 mg by mouth as needed for anxiety.     Lutein-Zeaxanthin 25-5 MG CAPS Take 1 tablet by mouth daily. EYE VITAMIN     mupirocin ointment (BACTROBAN) 2 % Apply topically 2 (two) times daily.     SIMPLY SALINE NA Place into the nose.     triamcinolone cream (KENALOG) 0.1 % Apply topically 2 (two) times daily.     warfarin (COUMADIN) 5 MG tablet Take 5 mg by mouth  daily.  2   No current facility-administered medications for this visit.     Review of Systems  Please see the history of present illness.    (+) Anxiety (+) Chest heaviness, shortness of breath  All other systems reviewed and are otherwise negative except as noted above.  Physical Exam    Wt Readings from Last 3 Encounters:  08/13/22 245 lb 12.8 oz (111.5  kg)  12/19/21 252 lb (114.3 kg)  08/06/21 248 lb 6.4 oz (112.7 kg)   [id number] were no vitals filed for this visit.,There is no height or weight on file to calculate BMI.  Constitutional:      Appearance: Healthy appearance. Not in distress.  Neck:     Vascular: JVD normal.  Pulmonary:     Effort: Pulmonary effort is normal.     Breath sounds: No wheezing. No rales. Diminished in the bases Cardiovascular:     Normal rate. Regular rhythm. Normal S1. Normal S2.      Murmurs: There is no murmur.  Edema:    Peripheral edema absent.  Abdominal:     Palpations: Abdomen is soft non tender. There is no hepatomegaly.  Skin:    General: Skin is warm and dry.  Neurological:     General: No focal deficit present.     Mental Status: Alert and oriented to person, place and time.     Cranial Nerves: Cranial nerves are intact.  EKG/LABS/ Recent Cardiac Studies    ECG personally reviewed by me today -atrial fibrillation with rate of 90 bpm and no acute changes consistent with previous EKG.  Risk Assessment/Calculations:    CHA2DS2-VASc Score = 4   This indicates a 4.8% annual risk of stroke. The patient's score is based upon: CHF History: 0 HTN History: 1 Diabetes History: 0 Stroke History: 0 Vascular Disease History: 0 Age Score: 2 Gender Score: 1           Lab Results  Component Value Date   WBC 18.6 (H) 08/15/2022   HGB 14.4 08/15/2022   HCT 43.0 08/15/2022   MCV 92 08/15/2022   PLT 213 08/15/2022   Lab Results  Component Value Date   CREATININE 0.73 08/15/2022   BUN 16 08/15/2022   NA 133 (L) 08/15/2022    K 3.6 08/15/2022   CL 98 08/15/2022   CO2 26 08/15/2022   Lab Results  Component Value Date   ALT 34 (H) 08/15/2022   AST 30 08/15/2022   ALKPHOS 97 08/15/2022   BILITOT 1.0 08/15/2022   No results found for: "CHOL", "HDL", "LDLCALC", "LDLDIRECT", "TRIG", "CHOLHDL"  No results found for: "HGBA1C"  Cardiac Studies & Procedures         MONITORS  LONG TERM MONITOR (3-14 DAYS) 11/26/2018  Narrative  Atrial fibrillation, 100% of the time.  Average HR 86 bpm.  SHe is on anticoagulation for prior DVT.  HR ranges from 44- 146.  Continue current therapy.           Assessment & Plan    1.  Chest heaviness: -Patient's last ischemic evaluation was completed 2009 Patient reports chest heaviness that is mainly associated with increased anxiety. -She denies any sharp stabbing pain and states that discomfort does not travel to her arms or neck. -We discussed following up with her PCP for possible referral for counseling.   2.  Permanent atrial fibrillation: -Patient currently rate controlled with Cardizem 30 to 60 mg daily -Patient is currently on Coumadin 5 mg daily -CHA2DS2-VASc Score = 4 [CHF History: 0, HTN History: 1, Diabetes History: 0, Stroke History: 0, Vascular Disease History: 0, Age Score: 2, Gender Score: 1].  Therefore, the patient's annual risk of stroke is 4.8 %.      3.  Lower extremity edema: -Patient noted to have lower extremity edema with recent diagnosis of stasis eczema    4.  Essential hypertension: -Patient's blood pressure today is  well-controlled at 130/80 -Continue half a tablet of Avalide 300-12.5    5.  Hyperlipidemia: -Patient's last LDL cholesterol was 183 -She reports intolerance to statins and is unable to afford PCSK9 inhibitors. -We discussed possibility of completing a calcium scoring for further restratification. -She will let us know if she wishes to proceed in the future.   6. History of PE/DVT: -Chronic DVT currently treated with  Coumadin 5 mg -No recurrence since  7.  Chronic lower extremity edema: -Patient has +2 chronic lower extremity edema and recently completed a venous Doppler which was normal. -She is currently suffering from stasis eczema and is on steroids -She was started on Lasix by her PCP and has been taking as needed.       Disposition: Follow-up with Larae Grooms, MD or APP  as scheduled    Medication Adjustments/Labs and Tests Ordered: Current medicines are reviewed at length with the patient today.  Concerns regarding medicines are outlined above.   Signed, Mable Fill, Marissa Nestle, NP 08/26/2022, 1:22 PM [facility]  Note:  This document was prepared using Dragon voice recognition software and may include unintentional dictation errors.

## 2022-08-26 NOTE — Patient Instructions (Signed)
Medication Instructions:  Your physician has requested that you have an echocardiogram. Echocardiography is a painless test that uses sound waves to create images of your heart. It provides your doctor with information about the size and shape of your heart and how well your heart's chambers and valves are working. This procedure takes approximately one hour. There are no restrictions for this procedure. Please do NOT wear cologne, perfume, aftershave, or lotions (deodorant is allowed). Please arrive 15 minutes prior to your appointment time. *If you need a refill on your cardiac medications before your next appointment, please call your pharmacy*   Lab Work: None ordered If you have labs (blood work) drawn today and your tests are completely normal, you will receive your results only by: [place] (if you have MyChart) OR A paper copy in the mail If you have any lab test that is abnormal or we need to change your treatment, we will call you to review the results.   Testing/Procedures: Your physician has requested that you have an echocardiogram. Echocardiography is a painless test that uses sound waves to create images of your heart. It provides your doctor with information about the size and shape of your heart and how well your heart's chambers and valves are working. This procedure takes approximately one hour. There are no restrictions for this procedure. Please do NOT wear cologne, perfume, aftershave, or lotions (deodorant is allowed). Please arrive 15 minutes prior to your appointment time.   Follow-Up: At [facility], you and your health needs are our priority.  As part of our continuing mission to provide you with exceptional heart care, we have created designated Provider Care Teams.  These Care Teams include your primary Cardiologist (physician) and Advanced Practice Providers (APPs -  Physician Assistants and Nurse Practitioners) who all work together to provide you  with the care you need, when you need it.  We recommend signing up for the patient portal called "MyChart".  Sign up information is provided on this After Visit Summary.  MyChart is used to connect with patients for Virtual Visits (Telemedicine).  Patients are able to view lab/test results, encounter notes, upcoming appointments, etc.  Non-urgent messages can be sent to your provider as well.   To learn more about what you can do with MyChart, go to [email or web address].    Your next appointment:   4-5 month(s)  Provider:   Larae Grooms, MD     Other Instructions

## 2022-08-27 ENCOUNTER — Ambulatory Visit: Payer: Medicare HMO | Admitting: Nurse Practitioner

## 2022-08-27 ENCOUNTER — Other Ambulatory Visit: Payer: Self-pay | Admitting: Internal Medicine

## 2022-08-27 DIAGNOSIS — G4733 Obstructive sleep apnea (adult) (pediatric): Secondary | ICD-10-CM

## 2022-08-27 DIAGNOSIS — I4821 Permanent atrial fibrillation: Secondary | ICD-10-CM

## 2022-08-27 DIAGNOSIS — R0789 Other chest pain: Secondary | ICD-10-CM

## 2022-08-27 DIAGNOSIS — I825Y2 Chronic embolism and thrombosis of unspecified deep veins of left proximal lower extremity: Secondary | ICD-10-CM

## 2022-08-27 DIAGNOSIS — I10 Essential (primary) hypertension: Secondary | ICD-10-CM

## 2022-08-27 DIAGNOSIS — Z1231 Encounter for screening mammogram for malignant neoplasm of breast: Secondary | ICD-10-CM

## 2022-08-29 DIAGNOSIS — I10 Essential (primary) hypertension: Secondary | ICD-10-CM | POA: Diagnosis not present

## 2022-08-29 DIAGNOSIS — R0600 Dyspnea, unspecified: Secondary | ICD-10-CM | POA: Diagnosis not present

## 2022-08-29 DIAGNOSIS — E782 Mixed hyperlipidemia: Secondary | ICD-10-CM | POA: Diagnosis not present

## 2022-09-08 DIAGNOSIS — G4733 Obstructive sleep apnea (adult) (pediatric): Secondary | ICD-10-CM | POA: Diagnosis not present

## 2022-09-10 ENCOUNTER — Ambulatory Visit: Payer: Medicare HMO | Admitting: Student

## 2022-09-15 DIAGNOSIS — I8310 Varicose veins of unspecified lower extremity with inflammation: Secondary | ICD-10-CM | POA: Diagnosis not present

## 2022-09-15 DIAGNOSIS — D485 Neoplasm of uncertain behavior of skin: Secondary | ICD-10-CM | POA: Diagnosis not present

## 2022-09-15 DIAGNOSIS — L821 Other seborrheic keratosis: Secondary | ICD-10-CM | POA: Diagnosis not present

## 2022-09-18 ENCOUNTER — Emergency Department (HOSPITAL_BASED_OUTPATIENT_CLINIC_OR_DEPARTMENT_OTHER): Payer: Medicare HMO

## 2022-09-18 ENCOUNTER — Inpatient Hospital Stay (HOSPITAL_COMMUNITY)
Admission: EM | Admit: 2022-09-18 | Discharge: 2022-09-26 | DRG: 871 | Disposition: A | Discharge status: Home Health | Payer: Medicare HMO | Attending: Internal Medicine | Admitting: Internal Medicine

## 2022-09-18 ENCOUNTER — Encounter (HOSPITAL_COMMUNITY): Payer: Self-pay

## 2022-09-18 ENCOUNTER — Ambulatory Visit
Admission: EM | Admit: 2022-09-18 | Discharge: 2022-09-18 | Disposition: A | Discharge status: Home / Self Care | Payer: Medicare HMO | Attending: Family Medicine | Admitting: Family Medicine

## 2022-09-18 ENCOUNTER — Encounter: Payer: Self-pay | Admitting: Emergency Medicine

## 2022-09-18 ENCOUNTER — Other Ambulatory Visit: Payer: Self-pay

## 2022-09-18 DIAGNOSIS — Z888 Allergy status to other drugs, medicaments and biological substances status: Secondary | ICD-10-CM

## 2022-09-18 DIAGNOSIS — I5033 Acute on chronic diastolic (congestive) heart failure: Secondary | ICD-10-CM

## 2022-09-18 DIAGNOSIS — Z85820 Personal history of malignant melanoma of skin: Secondary | ICD-10-CM

## 2022-09-18 DIAGNOSIS — E039 Hypothyroidism, unspecified: Secondary | ICD-10-CM | POA: Diagnosis present

## 2022-09-18 DIAGNOSIS — Z8701 Personal history of pneumonia (recurrent): Secondary | ICD-10-CM

## 2022-09-18 DIAGNOSIS — Z79899 Other long term (current) drug therapy: Secondary | ICD-10-CM

## 2022-09-18 DIAGNOSIS — Z823 Family history of stroke: Secondary | ICD-10-CM

## 2022-09-18 DIAGNOSIS — E871 Hypo-osmolality and hyponatremia: Secondary | ICD-10-CM | POA: Diagnosis not present

## 2022-09-18 DIAGNOSIS — R0602 Shortness of breath: Secondary | ICD-10-CM | POA: Diagnosis not present

## 2022-09-18 DIAGNOSIS — B965 Pseudomonas (aeruginosa) (mallei) (pseudomallei) as the cause of diseases classified elsewhere: Secondary | ICD-10-CM | POA: Diagnosis not present

## 2022-09-18 DIAGNOSIS — L03115 Cellulitis of right lower limb: Secondary | ICD-10-CM | POA: Diagnosis present

## 2022-09-18 DIAGNOSIS — E876 Hypokalemia: Secondary | ICD-10-CM | POA: Diagnosis not present

## 2022-09-18 DIAGNOSIS — I110 Hypertensive heart disease with heart failure: Secondary | ICD-10-CM | POA: Diagnosis present

## 2022-09-18 DIAGNOSIS — I480 Paroxysmal atrial fibrillation: Secondary | ICD-10-CM | POA: Diagnosis not present

## 2022-09-18 DIAGNOSIS — Z91040 Latex allergy status: Secondary | ICD-10-CM

## 2022-09-18 DIAGNOSIS — I420 Dilated cardiomyopathy: Secondary | ICD-10-CM | POA: Diagnosis not present

## 2022-09-18 DIAGNOSIS — L0390 Cellulitis, unspecified: Secondary | ICD-10-CM

## 2022-09-18 DIAGNOSIS — I5030 Unspecified diastolic (congestive) heart failure: Secondary | ICD-10-CM | POA: Diagnosis not present

## 2022-09-18 DIAGNOSIS — G4733 Obstructive sleep apnea (adult) (pediatric): Secondary | ICD-10-CM

## 2022-09-18 DIAGNOSIS — R000 Tachycardia, unspecified: Secondary | ICD-10-CM | POA: Diagnosis not present

## 2022-09-18 DIAGNOSIS — I10 Essential (primary) hypertension: Secondary | ICD-10-CM | POA: Diagnosis present

## 2022-09-18 DIAGNOSIS — D869 Sarcoidosis, unspecified: Secondary | ICD-10-CM | POA: Diagnosis present

## 2022-09-18 DIAGNOSIS — Z7901 Long term (current) use of anticoagulants: Secondary | ICD-10-CM

## 2022-09-18 DIAGNOSIS — L03119 Cellulitis of unspecified part of limb: Secondary | ICD-10-CM | POA: Diagnosis present

## 2022-09-18 DIAGNOSIS — E8809 Other disorders of plasma-protein metabolism, not elsewhere classified: Secondary | ICD-10-CM | POA: Diagnosis present

## 2022-09-18 DIAGNOSIS — M79604 Pain in right leg: Secondary | ICD-10-CM

## 2022-09-18 DIAGNOSIS — L538 Other specified erythematous conditions: Secondary | ICD-10-CM

## 2022-09-18 DIAGNOSIS — I4821 Permanent atrial fibrillation: Secondary | ICD-10-CM | POA: Diagnosis not present

## 2022-09-18 DIAGNOSIS — Z86718 Personal history of other venous thrombosis and embolism: Secondary | ICD-10-CM | POA: Diagnosis not present

## 2022-09-18 DIAGNOSIS — Z90710 Acquired absence of both cervix and uterus: Secondary | ICD-10-CM

## 2022-09-18 DIAGNOSIS — I4819 Other persistent atrial fibrillation: Secondary | ICD-10-CM | POA: Diagnosis not present

## 2022-09-18 DIAGNOSIS — F419 Anxiety disorder, unspecified: Secondary | ICD-10-CM | POA: Diagnosis present

## 2022-09-18 DIAGNOSIS — Z885 Allergy status to narcotic agent status: Secondary | ICD-10-CM

## 2022-09-18 DIAGNOSIS — E785 Hyperlipidemia, unspecified: Secondary | ICD-10-CM | POA: Diagnosis not present

## 2022-09-18 DIAGNOSIS — Z86711 Personal history of pulmonary embolism: Secondary | ICD-10-CM

## 2022-09-18 DIAGNOSIS — F32A Depression, unspecified: Secondary | ICD-10-CM | POA: Diagnosis not present

## 2022-09-18 DIAGNOSIS — Z9989 Dependence on other enabling machines and devices: Secondary | ICD-10-CM

## 2022-09-18 DIAGNOSIS — Z803 Family history of malignant neoplasm of breast: Secondary | ICD-10-CM

## 2022-09-18 DIAGNOSIS — Z881 Allergy status to other antibiotic agents status: Secondary | ICD-10-CM

## 2022-09-18 DIAGNOSIS — Z6841 Body Mass Index (BMI) 40.0 and over, adult: Secondary | ICD-10-CM | POA: Diagnosis not present

## 2022-09-18 DIAGNOSIS — Z85850 Personal history of malignant neoplasm of thyroid: Secondary | ICD-10-CM

## 2022-09-18 DIAGNOSIS — Z86010 Personal history of colonic polyps: Secondary | ICD-10-CM

## 2022-09-18 DIAGNOSIS — I5082 Biventricular heart failure: Secondary | ICD-10-CM | POA: Diagnosis not present

## 2022-09-18 DIAGNOSIS — Z79890 Hormone replacement therapy (postmenopausal): Secondary | ICD-10-CM | POA: Diagnosis not present

## 2022-09-18 DIAGNOSIS — Z886 Allergy status to analgesic agent status: Secondary | ICD-10-CM

## 2022-09-18 DIAGNOSIS — A419 Sepsis, unspecified organism: Secondary | ICD-10-CM | POA: Diagnosis not present

## 2022-09-18 DIAGNOSIS — Z7983 Long term (current) use of bisphosphonates: Secondary | ICD-10-CM

## 2022-09-18 DIAGNOSIS — I4891 Unspecified atrial fibrillation: Secondary | ICD-10-CM | POA: Diagnosis not present

## 2022-09-18 DIAGNOSIS — I5031 Acute diastolic (congestive) heart failure: Secondary | ICD-10-CM | POA: Diagnosis present

## 2022-09-18 DIAGNOSIS — R350 Frequency of micturition: Secondary | ICD-10-CM | POA: Diagnosis present

## 2022-09-18 DIAGNOSIS — I429 Cardiomyopathy, unspecified: Secondary | ICD-10-CM | POA: Diagnosis not present

## 2022-09-18 DIAGNOSIS — Z91048 Other nonmedicinal substance allergy status: Secondary | ICD-10-CM

## 2022-09-18 DIAGNOSIS — R0609 Other forms of dyspnea: Secondary | ICD-10-CM | POA: Diagnosis not present

## 2022-09-18 DIAGNOSIS — I5021 Acute systolic (congestive) heart failure: Secondary | ICD-10-CM | POA: Diagnosis not present

## 2022-09-18 DIAGNOSIS — E669 Obesity, unspecified: Secondary | ICD-10-CM | POA: Diagnosis present

## 2022-09-18 LAB — CBC WITH DIFFERENTIAL/PLATELET
Abs Immature Granulocytes: 0.06 10*3/uL (ref 0.00–0.07)
Basophils Absolute: 0 10*3/uL (ref 0.0–0.1)
Basophils Relative: 0 %
Eosinophils Absolute: 0 10*3/uL (ref 0.0–0.5)
Eosinophils Relative: 0 %
HCT: 42.3 % (ref 36.0–46.0)
Hemoglobin: 13.8 g/dL (ref 12.0–15.0)
Immature Granulocytes: 1 %
Lymphocytes Relative: 9 %
Lymphs Abs: 1.2 10*3/uL (ref 0.7–4.0)
MCH: 30.6 pg (ref 26.0–34.0)
MCHC: 32.6 g/dL (ref 30.0–36.0)
MCV: 93.8 fL (ref 80.0–100.0)
Monocytes Absolute: 1.2 10*3/uL — ABNORMAL HIGH (ref 0.1–1.0)
Monocytes Relative: 9 %
Neutro Abs: 10.8 10*3/uL — ABNORMAL HIGH (ref 1.7–7.7)
Neutrophils Relative %: 81 %
Platelets: 147 10*3/uL — ABNORMAL LOW (ref 150–400)
RBC: 4.51 MIL/uL (ref 3.87–5.11)
RDW: 15.9 % — ABNORMAL HIGH (ref 11.5–15.5)
WBC: 13.3 10*3/uL — ABNORMAL HIGH (ref 4.0–10.5)
nRBC: 0 % (ref 0.0–0.2)

## 2022-09-18 LAB — COMPREHENSIVE METABOLIC PANEL
ALT: 34 U/L (ref 0–44)
AST: 26 U/L (ref 15–41)
Albumin: 2.9 g/dL — ABNORMAL LOW (ref 3.5–5.0)
Alkaline Phosphatase: 64 U/L (ref 38–126)
Anion gap: 19 — ABNORMAL HIGH (ref 5–15)
BUN: 25 mg/dL — ABNORMAL HIGH (ref 8–23)
CO2: 29 mmol/L (ref 22–32)
Calcium: 9.1 mg/dL (ref 8.9–10.3)
Chloride: 88 mmol/L — ABNORMAL LOW (ref 98–111)
Creatinine, Ser: 0.89 mg/dL (ref 0.44–1.00)
GFR, Estimated: >60 mL/min (ref >60)
Glucose, Bld: 133 mg/dL — ABNORMAL HIGH (ref 70–99)
Potassium: 3.4 mmol/L — ABNORMAL LOW (ref 3.5–5.1)
Sodium: 136 mmol/L (ref 135–145)
Total Bilirubin: 0.5 mg/dL (ref 0.3–1.2)
Total Protein: 7.3 g/dL (ref 6.5–8.1)

## 2022-09-18 LAB — LACTIC ACID, PLASMA: Lactic Acid, Venous: 1.3 mmol/L (ref 0.5–1.9)

## 2022-09-18 LAB — PROTIME-INR
INR: 2 — ABNORMAL HIGH (ref 0.8–1.2)
Prothrombin Time: 22.1 seconds — ABNORMAL HIGH (ref 11.4–15.2)

## 2022-09-18 MED ORDER — SODIUM CHLORIDE 0.9 % IV BOLUS
1000.0000 mL | Freq: Once | INTRAVENOUS | Status: AC
  Administered 2022-09-18: 1000 mL via INTRAVENOUS

## 2022-09-18 MED ORDER — ACETAMINOPHEN 325 MG PO TABS
650.0000 mg | ORAL_TABLET | Freq: Four times a day (QID) | ORAL | Status: DC | PRN
  Administered 2022-09-18 – 2022-09-24 (×3): 650 mg via ORAL
  Filled 2022-09-18 (×3): qty 2

## 2022-09-18 MED ORDER — SODIUM CHLORIDE 0.9 % IV SOLN
2.0000 g | Freq: Once | INTRAVENOUS | Status: AC
  Administered 2022-09-18: 2 g via INTRAVENOUS
  Filled 2022-09-18: qty 12.5

## 2022-09-18 MED ORDER — DOCUSATE SODIUM 100 MG PO CAPS
100.0000 mg | ORAL_CAPSULE | Freq: Two times a day (BID) | ORAL | Status: DC
  Administered 2022-09-18 – 2022-09-25 (×10): 100 mg via ORAL
  Filled 2022-09-18 (×15): qty 1

## 2022-09-18 MED ORDER — ALBUTEROL SULFATE (2.5 MG/3ML) 0.083% IN NEBU
2.5000 mg | INHALATION_SOLUTION | RESPIRATORY_TRACT | Status: DC | PRN
  Administered 2022-09-20 – 2022-09-22 (×5): 2.5 mg via RESPIRATORY_TRACT
  Filled 2022-09-18 (×6): qty 3

## 2022-09-18 MED ORDER — POLYETHYLENE GLYCOL 3350 17 G PO PACK: 17.0000 g | PACK | Freq: Every day | ORAL | Status: DC | PRN

## 2022-09-18 MED ORDER — CLINDAMYCIN PHOSPHATE 600 MG/50ML IV SOLN
600.0000 mg | Freq: Three times a day (TID) | INTRAVENOUS | Status: DC
  Administered 2022-09-18 – 2022-09-19 (×2): 600 mg via INTRAVENOUS
  Filled 2022-09-18 (×4): qty 50

## 2022-09-18 MED ORDER — ACETAMINOPHEN 650 MG RE SUPP: 650.0000 mg | Freq: Four times a day (QID) | RECTAL | Status: DC | PRN

## 2022-09-18 MED ORDER — DOXYCYCLINE HYCLATE 100 MG PO CAPS: 100.0000 mg | ORAL_CAPSULE | Freq: Two times a day (BID) | ORAL | 0 refills | Status: DC

## 2022-09-18 MED ORDER — SODIUM CHLORIDE 0.9 % IV SOLN: INTRAVENOUS | Status: DC

## 2022-09-18 MED ORDER — ONDANSETRON HCL 4 MG PO TABS
4.0000 mg | ORAL_TABLET | Freq: Four times a day (QID) | ORAL | Status: DC | PRN
  Administered 2022-09-23: 4 mg via ORAL
  Filled 2022-09-18: qty 1

## 2022-09-18 MED ORDER — OXYCODONE HCL 5 MG PO TABS: 5.0000 mg | ORAL_TABLET | ORAL | Status: DC | PRN

## 2022-09-18 MED ORDER — DILTIAZEM HCL ER COATED BEADS 240 MG PO CP24: 360.0000 mg | ORAL_CAPSULE | Freq: Every day | ORAL | Status: DC

## 2022-09-18 MED ORDER — ONDANSETRON HCL 4 MG/2ML IJ SOLN: 4.0000 mg | Freq: Four times a day (QID) | INTRAMUSCULAR | Status: DC | PRN

## 2022-09-18 MED ORDER — WARFARIN SODIUM 5 MG PO TABS
5.0000 mg | ORAL_TABLET | Freq: Every day | ORAL | Status: DC
  Administered 2022-09-18 – 2022-09-21 (×4): 5 mg via ORAL
  Filled 2022-09-18 (×5): qty 1

## 2022-09-18 MED ORDER — VANCOMYCIN HCL IN DEXTROSE 1-5 GM/200ML-% IV SOLN: 1000.0000 mg | Freq: Once | INTRAVENOUS | Status: DC

## 2022-09-18 MED ORDER — LORAZEPAM 1 MG PO TABS
1.0000 mg | ORAL_TABLET | ORAL | Status: DC | PRN
  Administered 2022-09-18: 1 mg via ORAL
  Filled 2022-09-18: qty 1

## 2022-09-18 MED ORDER — VANCOMYCIN HCL 2000 MG/400ML IV SOLN
2000.0000 mg | Freq: Once | INTRAVENOUS | Status: AC
  Administered 2022-09-18: 2000 mg via INTRAVENOUS
  Filled 2022-09-18: qty 400

## 2022-09-18 MED ORDER — WARFARIN - PHARMACIST DOSING INPATIENT: Freq: Every day | Status: DC

## 2022-09-18 NOTE — ED Triage Notes (Signed)
Pt arrived via POV, c/o right leg swelling and redness on and off for a couple months. Worsening today. Went to UC and sent to ED for IV abx.

## 2022-09-18 NOTE — Progress Notes (Signed)
ANTICOAGULATION CONSULT NOTE - Initial Consult  Pharmacy Consult for warfarin Indication: atrial fibrillation, h/o VTE  Allergies  Allergen Reactions   Codeine Other (See Comments)    Causes hyperactivity   Erythromycin Itching and Other (See Comments)    Also with jitters   Tetracyclines & Related Itching    Also with jitters   Statins Other (See Comments)    Leg muscle weakness   Atenolol     Other reaction(s): Depression   Citalopram Hydrobromide     Other reaction(s): Nausea/Fatigue   Coenzyme Q10     Other reaction(s): Itching   Losartan Potassium     Other reaction(s): muscle aches   Latex Rash    Vital Signs: Temp: 98.5 F (36.9 C) (04/11 1810) Temp Source: Oral (04/11 1810) BP: 129/84 (04/11 1800) Pulse Rate: 105 (04/11 1800)  Labs: Recent Labs    09/18/22 1519  HGB 13.8  HCT 42.3  PLT 147*  LABPROT 22.1*  INR 2.0*  CREATININE 0.89    Estimated Creatinine Clearance: 56.8 mL/min (by C-G formula based on SCr of 0.89 mg/dL).   Medical History: Past Medical History:  Diagnosis Date   Anxiety    takes Lorazepam daily as needed   Arthritis    Cancer    thyroid   Cataracts, bilateral    immature   Chronic back pain    compressed vertebrae,takes Fosamax weekly   Family history of adverse reaction to anesthesia    granddaughter gets very sick and mean   History of blood transfusion    no abnormal reaction noted   History of bronchitis 05/2015   History of colon polyps    benign   Hyperlipidemia    takes Zetia daily   Hypertension    takes Diltiazem and Avapro daily   Hypothyroidism    takes Synthroid daily   Ischemic colitis    hx of-many yrs ago   Joint pain    Joint swelling    OSA on CPAP    05-08-12 AHi was 46, RDI  53. titrated to   9 cm water  with 3 cm flex.-nadir 75%   PE (pulmonary embolism)    takes Coumadin daily   Peripheral edema    Pneumonia 1992   hx of   Sarcoidosis    Shortness of breath    Urinary frequency     Weakness    left leg.Numbness and tingling.Has sciatica   Wheeze    occaionally. Not new     Assessment: 82 year old female presented with right leg swelling and redness. Per ED note, patient reports being seen for same in early March and completing treatment with clindamycin. She notes fever PTA. She was sent from urgent care to ED for further treatment; she was started on IV antibiotics. Patient also with history of VTE and atrial fibrillation for which she is on warfarin. I spoke with patient who states that she takes warfarin 5 mg daily with last dose PTA 4/10.  INR on admission therapeutic.  Goal of Therapy:  INR 2-3 Monitor platelets by anticoagulation protocol: Yes   Plan:  -Continue warfarin at home dose -Follow up INR with morning labs - if remains stable and no new interacting meds, will consider decreasing frequency of monitoring -Monitor for signs/symptoms of bleeding   Pricilla Riffle, PharmD, BCPS Clinical Pharmacist 09/18/2022 6:22 PM

## 2022-09-18 NOTE — ED Triage Notes (Signed)
Patient c/o right leg swelling and redness that goes from her foot to her groin area x 4 days.  Patient did have a fever of 103.4 on Tuesday.  Patient has been taken Tylenol for fever and pain.

## 2022-09-18 NOTE — ED Notes (Signed)
ED TO INPATIENT HANDOFF REPORT  ED Nurse Name and Phone #: Thamas Jaegers Name/Age/Gender [name] 82 y.o. female Room/Bed: WA21/WA21  Code Status   Code Status: Full Code  Home/SNF/Other Home Patient oriented to: self, place, time, and situation Is this baseline? Yes   Triage Complete: Triage complete  Chief Complaint Cellulitis of right leg [L03.115]  Triage Note Pt arrived via POV, c/o right leg swelling and redness on and off for a couple months. Worsening today. Went to UC and sent to ED for IV abx.    Allergies Allergies  Allergen Reactions   Codeine Other (See Comments)    Causes hyperactivity   Erythromycin Itching and Other (See Comments)    Also with jitters   Tetracyclines & Related Itching    Also with jitters   Statins Other (See Comments)    Leg muscle weakness   Atenolol     Other reaction(s): Depression   Citalopram Hydrobromide     Other reaction(s): Nausea/Fatigue   Coenzyme Q10     Other reaction(s): Itching   Losartan Potassium     Other reaction(s): muscle aches   Latex Rash    Level of Care/Admitting Diagnosis ED Disposition     ED Disposition  Admit   Condition  --   Comment  Hospital Area: [facility] [[id number]]  Level of Care: Med-Surg [16]  May admit patient to [facility] or [facility] if equivalent level of care is available:: Yes  Covid Evaluation: Asymptomatic - no recent exposure (last 10 days) testing not required  Diagnosis: Cellulitis of right leg [941740]  Admitting Physician: Maryln Gottron [[id number]]  Attending Physician: [facility], [facility] [email or web address] [[id number]]  Certification:: I certify this patient will need inpatient services for at least 2 midnights  Estimated Length of Stay: 3          B Medical/Surgery History Past Medical History:  Diagnosis Date   Anxiety    takes Lorazepam daily as needed   Arthritis    Cancer    thyroid   Cataracts, bilateral    immature   Chronic back pain     compressed vertebrae,takes Fosamax weekly   Family history of adverse reaction to anesthesia    granddaughter gets very sick and mean   History of blood transfusion    no abnormal reaction noted   History of bronchitis 05/2015   History of colon polyps    benign   Hyperlipidemia    takes Zetia daily   Hypertension    takes Diltiazem and Avapro daily   Hypothyroidism    takes Synthroid daily   Ischemic colitis    hx of-many yrs ago   Joint pain    Joint swelling    OSA on CPAP    05-08-12 AHi was 46, RDI  53. titrated to   9 cm water  with 3 cm flex.-nadir 75%   PE (pulmonary embolism)    takes Coumadin daily   Peripheral edema    Pneumonia 1992   hx of   Sarcoidosis    Shortness of breath    Urinary frequency    Weakness    left leg.Numbness and tingling.Has sciatica   Wheeze    occaionally. Not new   Past Surgical History:  Procedure Laterality Date   ABDOMINAL HYSTERECTOMY     APPENDECTOMY     BREAST BIOPSY     biopsies on right x 2   BREAST CYST ASPIRATION Left    CHOLECYSTECTOMY  N/A 11/29/2015   Procedure: LAPAROSCOPIC CHOLECYSTECTOMY;  Surgeon: Almond Lint, MD;  Location: [facility] OR;  Service: General;  Laterality: N/A;   COLONOSCOPY     DILATION AND CURETTAGE OF UTERUS     LIVER BIOPSY     MELANOMA EXCISION     removed from left leg   scalene surgery  1972   "trying to find out what was wrong with her lungs"   THYROID SURGERY       A IV Location/Drains/Wounds Patient Lines/Drains/Airways Status     Active Line/Drains/Airways     Name Placement date Placement time Site Days   Peripheral IV 09/18/22 22 G Right Antecubital 09/18/22  1519  Antecubital  less than 1   Incision - 4 Ports 1: Mid;Upper 2: Umbilicus 3: Right;Mid 4: Right;Lateral 11/29/15  --  -- 2485            Intake/Output Last 24 hours  Intake/Output Summary (Last 24 hours) at 09/18/2022 1724 Last data filed at 09/18/2022 1722 Gross per 24 hour  Intake 1100 ml  Output --  Net 1100  ml    Labs/Imaging Results for orders placed or performed during the hospital encounter of 09/18/22 (from the past 48 hour(s))  CBC with Differential/Platelet     Status: Abnormal   Collection Time: 09/18/22  3:19 PM  Result Value Ref Range   WBC 13.3 (H) 4.0 - 10.5 K/uL   RBC 4.51 3.87 - 5.11 MIL/uL   Hemoglobin 13.8 12.0 - 15.0 g/dL   HCT 01.0 27.2 - 53.6 %   MCV 93.8 80.0 - 100.0 fL   MCH 30.6 26.0 - 34.0 pg   MCHC 32.6 30.0 - 36.0 g/dL   RDW 64.4 (H) 03.4 - 74.2 %   Platelets 147 (L) 150 - 400 K/uL   nRBC 0.0 0.0 - 0.2 %   Neutrophils Relative % 81 %   Neutro Abs 10.8 (H) 1.7 - 7.7 K/uL   Lymphocytes Relative 9 %   Lymphs Abs 1.2 0.7 - 4.0 K/uL   Monocytes Relative 9 %   Monocytes Absolute 1.2 (H) 0.1 - 1.0 K/uL   Eosinophils Relative 0 %   Eosinophils Absolute 0.0 0.0 - 0.5 K/uL   Basophils Relative 0 %   Basophils Absolute 0.0 0.0 - 0.1 K/uL   Immature Granulocytes 1 %   Abs Immature Granulocytes 0.06 0.00 - 0.07 K/uL    Comment: Performed at [facility], [address] [place], [place] [address]  Lactic acid, plasma     Status: None   Collection Time: 09/18/22  3:19 PM  Result Value Ref Range   Lactic Acid, Venous 1.3 0.5 - 1.9 mmol/L    Comment: Performed at [facility], [address], [place], [place] [address]  Comprehensive metabolic panel     Status: Abnormal   Collection Time: 09/18/22  3:19 PM  Result Value Ref Range   Sodium 136 135 - 145 mmol/L   Potassium 3.4 (L) 3.5 - 5.1 mmol/L   Chloride 88 (L) 98 - 111 mmol/L   CO2 29 22 - 32 mmol/L   Glucose, Bld 133 (H) 70 - 99 mg/dL    Comment: Glucose reference range applies only to samples taken after fasting for at least 8 hours.   BUN 25 (H) 8 - 23 mg/dL   Creatinine, Ser 3.32 0.44 - 1.00 mg/dL   Calcium 9.1 8.9 - 95.1 mg/dL   Total Protein 7.3 6.5 - 8.1 g/dL  Albumin 2.9 (L) 3.5 - 5.0 g/dL   AST 26 15 - 41 U/L   ALT 34 0 - 44 U/L   Alkaline Phosphatase 64 38 -  126 U/L   Total Bilirubin 0.5 0.3 - 1.2 mg/dL   GFR, Estimated >16 >10 mL/min    Comment: (NOTE) Calculated using the CKD-EPI Creatinine Equation (2021)    Anion gap 19 (H) 5 - 15    Comment: Performed at [facility], [address], [place], [place] [address]  Protime-INR     Status: Abnormal   Collection Time: 09/18/22  3:19 PM  Result Value Ref Range   Prothrombin Time 22.1 (H) 11.4 - 15.2 seconds   INR 2.0 (H) 0.8 - 1.2    Comment: (NOTE) INR goal varies based on device and disease states. Performed at [facility], [address], [place], [place] [address]    VAS [place] LOWER EXTREMITY VENOUS (DVT) 7a-7p  Result Date: 09/18/2022  Lower Venous DVT Study Patient Name:  [name] Julius  Date of Exam:   09/18/2022 Medical Rec #: [id number]             Accession #:    [id number] Date of Birth: [birth year]             Patient Gender: F Patient Age:   59 years Exam Location:  [facility] Procedure:      VAS [place] LOWER EXTREMITY VENOUS (DVT) Referring Phys: JULIE HAVILAND --------------------------------------------------------------------------------  Indications: Right leg pain, erythema, cellulitis workup. Other Indications: Varicosities. Limitations: Body habitus. Patient pain/sensitivity to compression. Comparison Study: 05-07-2021 Prior left lower extremity venous study was                   negative for DVT. Performing Technologist: Jean Rosenthal RDMS, RVT  Examination Guidelines: A complete evaluation includes B-mode imaging, spectral Doppler, color Doppler, and power Doppler as needed of all accessible portions of each vessel. Bilateral testing is considered an integral part of a complete examination. Limited examinations for reoccurring indications may be performed as noted. The reflux portion of the exam is performed with the patient in reverse Trendelenburg.  +---------+---------------+---------+-----------+----------+-------------------+  RIGHT    CompressibilityPhasicitySpontaneityPropertiesThrombus Aging      +---------+---------------+---------+-----------+----------+-------------------+ CFV      Full           Yes      Yes                                      +---------+---------------+---------+-----------+----------+-------------------+ SFJ      Full                                                             +---------+---------------+---------+-----------+----------+-------------------+ FV Prox  Full                                                             +---------+---------------+---------+-----------+----------+-------------------+ FV Mid   Full                                                             +---------+---------------+---------+-----------+----------+-------------------+  FV DistalFull                                                             +---------+---------------+---------+-----------+----------+-------------------+ PFV      Full                                                             +---------+---------------+---------+-----------+----------+-------------------+ POP      Full           Yes      Yes                                      +---------+---------------+---------+-----------+----------+-------------------+ PTV      Full                                                             +---------+---------------+---------+-----------+----------+-------------------+ PERO                    Yes      Yes                  Not well visualized +---------+---------------+---------+-----------+----------+-------------------+ Gastroc  Full                                                             +---------+---------------+---------+-----------+----------+-------------------+   +----+---------------+---------+-----------+----------+--------------+ LEFTCompressibilityPhasicitySpontaneityPropertiesThrombus Aging  +----+---------------+---------+-----------+----------+--------------+ CFV Full           Yes      Yes                                 +----+---------------+---------+-----------+----------+--------------+     Summary: RIGHT: - There is no evidence of deep vein thrombosis in the lower extremity. However, portions of this examination were limited- see technologist comments above.  - No cystic structure found in the popliteal fossa.  LEFT: - No evidence of common femoral vein obstruction.  *See table(s) above for measurements and observations. Electronically signed by Heath Lark on 09/18/2022 at 4:51:31 PM.    Final     Pending Labs Unresulted Labs (From admission, onward)     Start     Ordered   09/19/22 0500  Basic metabolic panel  Tomorrow morning,   R        09/18/22 1701   09/19/22 0500  CBC  Tomorrow morning,   R        09/18/22 1701   09/18/22 1420  Culture, blood (routine x 2)  BLOOD CULTURE X 2,   STAT        09/18/22 1426   09/18/22 1420  Aerobic Culture w Gram Stain (superficial specimen)  Once,   URGENT       Comments: Right lower extremity unhealed biopsy wound site    09/18/22 1426            Vitals/Pain Today's Vitals   09/18/22 1615 09/18/22 1630 09/18/22 1645 09/18/22 1700  BP: (!) 167/104 (!) 150/89 130/85 (!) 141/86  Pulse: (!) 113 (!) 105 99 (!) 105  Resp: (!) 26 (!) 29 (!) 21 18  Temp:      TempSrc:      SpO2: 92% 92% 93% 92%    Isolation Precautions No active isolations  Medications Medications  sodium chloride 0.9 % bolus 1,000 mL (0 mLs Intravenous Stopped 09/18/22 1722)    And  0.9 %  sodium chloride infusion ( Intravenous New Bag/Given 09/18/22 1722)  vancomycin (VANCOREADY) IVPB 2000 mg/400 mL (2,000 mg Intravenous New Bag/Given 09/18/22 1610)  clindamycin (CLEOCIN) IVPB 600 mg (has no administration in time range)  acetaminophen (TYLENOL) tablet 650 mg (has no administration in time range)    Or  acetaminophen (TYLENOL) suppository 650 mg  (has no administration in time range)  oxyCODONE (Oxy IR/ROXICODONE) immediate release tablet 5 mg (has no administration in time range)  docusate sodium (COLACE) capsule 100 mg (has no administration in time range)  polyethylene glycol (MIRALAX / GLYCOLAX) packet 17 g (has no administration in time range)  ondansetron (ZOFRAN) tablet 4 mg (has no administration in time range)    Or  ondansetron (ZOFRAN) injection 4 mg (has no administration in time range)  albuterol (PROVENTIL) (2.5 MG/3ML) 0.083% nebulizer solution 2.5 mg (has no administration in time range)  ceFEPIme (MAXIPIME) 2 g in sodium chloride 0.9 % 100 mL IVPB (0 g Intravenous Stopped 09/18/22 1606)    Mobility walks     Focused Assessments     R Recommendations: See Admitting Provider Note  Report given to:   Additional Notes:

## 2022-09-18 NOTE — ED Provider Notes (Signed)
Care assumed after prior EDP.  Hospitalist service is aware of case and will evaluate for admission.   Wynetta Fines, MD 09/18/22 [phone]

## 2022-09-18 NOTE — Progress Notes (Signed)
Patient is transferred from ED to the unit at 1947. Alert and oriented x 4. Vital signs was taken, patient ambulate to the bathroom well and steady. No pain complained at this time. Family member is at bedside.

## 2022-09-18 NOTE — H&P (Signed)
History and Physical  [name] OZD:[id number] DOB: [birth year] DOA: 09/18/2022  PCP: Garlan Fillers, MD   Chief Complaint: Leg swelling and redness  HPI: [name] is a 82 y.o. female with medical history significant for DVT/PE on Coumadin, obesity, obstructive sleep apnea admitted to the hospital for recurrent right lower extremity cellulitis.  Patient was treated as an outpatient last month for right lower extremity cellulitis, completed a course of clindamycin prescribed at urgent care.  She says that she completed this antibiotic therapy about 3 weeks ago.  Was doing well until about 4 days ago, when she noticed recurrent erythema and swelling of the right lower extremity.  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  After she started noticing some redness on the top of her foot, it progressed over the last few days, she had fever as high as 103 F 3 days ago.  Yesterday her temperature was 100.1.  She presented to urgent care again this morning due to similar presentation with right leg swelling and redness.  Was told to come to the ER for evaluation.  ED Course: In the emergency department, she was found to be afebrile, tachycardic to 112, blood pressure 150/180 saturating 94% on room air.  Lab work was obtained which revealed leukocytosis of 13,000, and normal lactate.  Patient was given dose of IV cefepime and IV vancomycin, and hospitalist was contacted for admission.  Review of Systems: Please see HPI for pertinent positives and negatives. A complete 10 system review of systems are otherwise negative.  Past Medical History:  Diagnosis Date   Anxiety    takes Lorazepam daily as needed   Arthritis    Cancer    thyroid   Cataracts, bilateral    immature   Chronic back pain    compressed vertebrae,takes Fosamax weekly   Family history of adverse reaction to anesthesia     granddaughter gets very sick and mean   History of blood transfusion    no abnormal reaction noted   History of bronchitis 05/2015   History of colon polyps    benign   Hyperlipidemia    takes Zetia daily   Hypertension    takes Diltiazem and Avapro daily   Hypothyroidism    takes Synthroid daily   Ischemic colitis    hx of-many yrs ago   Joint pain    Joint swelling    OSA on CPAP    05-08-12 AHi was 46, RDI  53. titrated to   9 cm water  with 3 cm flex.-nadir 75%   PE (pulmonary embolism)    takes Coumadin daily   Peripheral edema    Pneumonia 1992   hx of   Sarcoidosis    Shortness of breath    Urinary frequency    Weakness    left leg.Numbness and tingling.Has sciatica   Wheeze    occaionally. Not new   Past Surgical History:  Procedure Laterality Date   ABDOMINAL HYSTERECTOMY     APPENDECTOMY     BREAST BIOPSY     biopsies on right x 2   BREAST CYST ASPIRATION Left    CHOLECYSTECTOMY N/A 11/29/2015   Procedure: LAPAROSCOPIC CHOLECYSTECTOMY;  Surgeon: Almond Lint, MD;  Location: [facility] OR;  Service: General;  Laterality: N/A;   COLONOSCOPY     DILATION AND CURETTAGE OF UTERUS     LIVER BIOPSY  MELANOMA EXCISION     removed from left leg   scalene surgery  1972   "trying to find out what was wrong with her lungs"   THYROID SURGERY      Social History:  reports that she has never smoked. She has never used smokeless tobacco. She reports that she does not drink alcohol and does not use drugs.   Allergies  Allergen Reactions   Codeine Other (See Comments)    Causes hyperactivity   Erythromycin Itching and Other (See Comments)    Also with jitters   Tetracyclines & Related Itching    Also with jitters   Statins Other (See Comments)    Leg muscle weakness   Atenolol     Other reaction(s): Depression   Citalopram Hydrobromide     Other reaction(s): Nausea/Fatigue   Coenzyme Q10     Other reaction(s): Itching   Losartan Potassium     Other reaction(s):  muscle aches   Latex Rash    Family History  Problem Relation Age of Onset   Breast cancer Mother    CVA Mother    Breast cancer Sister    Breast cancer Sister    Heart attack Neg Hx    Sleep apnea Neg Hx      Prior to Admission medications   Medication Sig Start Date End Date Taking? Authorizing Provider  acetaminophen (TYLENOL) 500 MG tablet Take 1,000-1,500 mg by mouth as needed (back pain).    [provider]  calcium carbonate (TUMS - DOSED IN MG ELEMENTAL CALCIUM) 500 MG chewable tablet as needed. 05/09/09   [provider]  Carboxymethylcellulose Sodium (REFRESH LIQUIGEL OP) Place 1 drop into both eyes at bedtime.    [provider]  diltiazem (TIAZAC) 360 MG 24 hr capsule Take 1 capsule by mouth daily.    [provider]  doxycycline (VIBRAMYCIN) 100 MG capsule Take 1 capsule (100 mg total) by mouth 2 (two) times daily for 10 days. 09/18/22 09/28/22  Trevor Iha, FNP  ergocalciferol (VITAMIN D2) 1.25 MG (50000 UT) capsule Take 50,000 Units by mouth once a week. Patient states she doesn't take weekly like prescribed 03/16/21   [provider]  irbesartan-hydrochlorothiazide (AVALIDE) 300-12.5 MG tablet Take 0.5 tablets by mouth daily. 09/14/18   Corky Crafts, MD  levothyroxine (SYNTHROID, LEVOTHROID) 100 MCG tablet Take 100 mcg by mouth daily before breakfast.  10/07/14   [provider]  LORazepam (ATIVAN) 1 MG tablet Take 1 mg by mouth as needed for anxiety.    [provider]  Lutein-Zeaxanthin 25-5 MG CAPS Take 1 tablet by mouth daily. EYE VITAMIN    [provider]  SIMPLY SALINE NA Place into the nose.    [provider]  triamcinolone cream (KENALOG) 0.1 % Apply topically 2 (two) times daily. 04/05/21   [provider]  warfarin (COUMADIN) 5 MG tablet Take 5 mg by mouth daily. 04/25/15   [provider]    Physical Exam: BP (!) 150/89   Pulse (!) 105   Temp 98.4 F  (36.9 C) (Oral)   Resp (!) 29   SpO2 92%   General:  Alert, oriented, calm, in no acute distress, morbid obese female appearing her stated age, husband at the bedside. Eyes: EOMI, clear conjuctivae, white sclerea Neck: supple, no masses, trachea mildline  Cardiovascular: RRR, no murmurs or rubs, she has pitting edema in the right lower extremity up to the knee Respiratory: clear to auscultation bilaterally, no wheezes,  no crackles  Abdomen: soft, nontender, nondistended, normal bowel tones heard  Skin: dry, no weeping, but she has obvious warmth and erythema most severe on the dorsum of the right foot, but tracking up to her knee and then into her thigh on the right.  There is also associated 2-3+ pitting edema. Musculoskeletal: no joint effusions, normal range of motion  Psychiatric: appropriate affect, normal speech  Neurologic: extraocular muscles intact, clear speech, moving all extremities with intact sensorium            Labs on Admission:  Basic Metabolic Panel: Recent Labs  Lab 09/18/22 1519  NA 136  K 3.4*  CL 88*  CO2 29  GLUCOSE 133*  BUN 25*  CREATININE 0.89  CALCIUM 9.1   Liver Function Tests: Recent Labs  Lab 09/18/22 1519  AST 26  ALT 34  ALKPHOS 64  BILITOT 0.5  PROT 7.3  ALBUMIN 2.9*   No results for input(s): "LIPASE", "AMYLASE" in the last 168 hours. No results for input(s): "AMMONIA" in the last 168 hours. CBC: Recent Labs  Lab 09/18/22 1519  WBC 13.3*  NEUTROABS 10.8*  HGB 13.8  HCT 42.3  MCV 93.8  PLT 147*   Cardiac Enzymes: No results for input(s): "CKTOTAL", "CKMB", "CKMBINDEX", "TROPONINI" in the last 168 hours.  BNP (last 3 results) Recent Labs    08/15/22 1301  BNP 95.9    ProBNP (last 3 results) No results for input(s): "PROBNP" in the last 8760 hours.  CBG: No results for input(s): "GLUCAP" in the last 168 hours.  Radiological Exams on Admission: VAS [place] LOWER EXTREMITY VENOUS (DVT) 7a-7p  Result Date: 09/18/2022   Lower Venous DVT Study Patient Name:  [name] [place]  Date of Exam:   09/18/2022 Medical Rec #: [id number]             Accession #:    [id number] Date of Birth: April 16, 1941             Patient Gender: F Patient Age:   3 years Exam Location:  [facility] Procedure:      VAS [place] LOWER EXTREMITY VENOUS (DVT) Referring Phys: JULIE HAVILAND --------------------------------------------------------------------------------  Indications: Right leg pain, erythema, cellulitis workup. Other Indications: Varicosities. Limitations: Body habitus. Patient pain/sensitivity to compression. Comparison Study: 05-07-2021 Prior left lower extremity venous study was                   negative for DVT. Performing Technologist: Jean Rosenthal RDMS, RVT  Examination Guidelines: A complete evaluation includes B-mode imaging, spectral Doppler, color Doppler, and power Doppler as needed of all accessible portions of each vessel. Bilateral testing is considered an integral part of a complete examination. Limited examinations for reoccurring indications may be performed as noted. The reflux portion of the exam is performed with the patient in reverse Trendelenburg.  +---------+---------------+---------+-----------+----------+-------------------+ RIGHT    CompressibilityPhasicitySpontaneityPropertiesThrombus Aging      +---------+---------------+---------+-----------+----------+-------------------+ CFV      Full           Yes      Yes                                      +---------+---------------+---------+-----------+----------+-------------------+ SFJ      Full                                                             +---------+---------------+---------+-----------+----------+-------------------+  FV Prox  Full                                                             +---------+---------------+---------+-----------+----------+-------------------+ FV Mid   Full                                                              +---------+---------------+---------+-----------+----------+-------------------+ FV DistalFull                                                             +---------+---------------+---------+-----------+----------+-------------------+ PFV      Full                                                             +---------+---------------+---------+-----------+----------+-------------------+ POP      Full           Yes      Yes                                      +---------+---------------+---------+-----------+----------+-------------------+ PTV      Full                                                             +---------+---------------+---------+-----------+----------+-------------------+ PERO                    Yes      Yes                  Not well visualized +---------+---------------+---------+-----------+----------+-------------------+ Gastroc  Full                                                             +---------+---------------+---------+-----------+----------+-------------------+   +----+---------------+---------+-----------+----------+--------------+ LEFTCompressibilityPhasicitySpontaneityPropertiesThrombus Aging +----+---------------+---------+-----------+----------+--------------+ CFV Full           Yes      Yes                                 +----+---------------+---------+-----------+----------+--------------+     Summary: RIGHT: - There is no evidence of deep vein thrombosis in the lower extremity. However, portions of this examination were limited- see technologist comments above.  - No cystic structure found in  the popliteal fossa.  LEFT: - No evidence of common femoral vein obstruction.  *See table(s) above for measurements and observations. Electronically signed by Heath Larkhomas Hawken on 09/18/2022 at 4:51:31 PM.    Final     Assessment/Plan Principal Problem:   Sepsis due to cellulitis-meeting sepsis criteria at  the time of admission with tachycardia, leukocytosis, source is cellulitis.  Normal lactate, patient is nontoxic in appearance and hemodynamically stable. -Inpatient admission -Empiric IV clindamycin -As needed pain control -Encourage elevation of the right lower extremity to reduce swelling  Active Problems:   OSA on CPAP-orders placed for CPAP nightly, patient's husband will bring her machine from home   Obesity-BMI 41, complicating all aspects of care   Essential hypertension, benign-Home medications will be continued once reconciled   Warfarin anticoagulation-for history of DVT/PE, INR therapeutic, will continue Coumadin per pharmacy consult.  DVT prophylaxis: Coumadin    Code Status: Full Code  Consults called: None  Admission status: The appropriate patient status for this patient is INPATIENT. Inpatient status is judged to be reasonable and necessary in order to provide the required intensity of service to ensure the patient's safety. The patient's presenting symptoms, physical exam findings, and initial radiographic and laboratory data in the context of their chronic comorbidities is felt to place them at high risk for further clinical deterioration. Furthermore, it is not anticipated that the patient will be medically stable for discharge from the hospital within 2 midnights of admission.    I certify that at the point of admission it is my clinical judgment that the patient will require inpatient hospital care spanning beyond 2 midnights from the point of admission due to high intensity of service, high risk for further deterioration and high frequency of surveillance required   Time spent: 49 minutes  Kenzy Campoverde Sharlette DenseM Tashiya Souders MD [facility] Pager [phone]  If 7PM-7AM, please contact night-coverage [email or web address] Password [email or web address]  09/18/2022, 5:01 PM

## 2022-09-18 NOTE — Progress Notes (Signed)
Lower extremity venous right study completed.  Preliminary results relayed to Particia Nearing, MD.   See CV Proc for preliminary results report.   Jean Rosenthal, RDMS, RVT

## 2022-09-18 NOTE — ED Notes (Signed)
Patient is being discharged from the Urgent Care and sent to the Emergency Department via private vehicle . Per Casimiro Needle Ragan,NP patient is in need of higher level of care due to further evaluation. Patient is aware and verbalizes understanding of plan of care.  Vitals:   09/18/22 1040  BP: 128/74  Pulse: (!) 104  Resp: 16  Temp: 98.7 F (37.1 C)  SpO2: 94%

## 2022-09-18 NOTE — Progress Notes (Signed)
PHARMACY NOTE:  ANTIMICROBIAL RENAL DOSAGE ADJUSTMENT  Current antimicrobial regimen includes a mismatch between antimicrobial dosage and estimated renal function.  As per policy approved by the Pharmacy & Therapeutics and Medical Executive Committees, the antimicrobial dosage will be adjusted accordingly.  Current antimicrobial dosage:  Vancomycin 1000 mg IV x1   Indication: cellulitis  Renal Function:  CrCl cannot be calculated (Patient's most recent lab result is older than the maximum 21 days allowed.). []      On intermittent HD, scheduled: []      On CRRT    Antimicrobial dosage has been changed to:   - Vancomycin 2000 mg IV x1   Additional comments:   Thank you for allowing pharmacy to be a part of this patient's care.   Adalberto Cole, PharmD, BCPS 09/18/2022 2:35 PM

## 2022-09-18 NOTE — Progress Notes (Signed)
    Patient Name: [name]           DOB: 10-Mar-1941  MRN: [id number]      Admission Date: 09/18/2022  Attending Provider: Maryln Gottron, MD  Primary Diagnosis: Sepsis due to cellulitis   Level of care: Med-Surg    CROSS COVER NOTE   Date of Service   09/18/2022   [name], 82 y.o. female, was admitted on 09/18/2022 for Sepsis due to cellulitis.    HPI/Events of Note   Patient requesting to be restarted on home medication Ativan for sleep and anxiety.   Interventions/ Plan   1 mg oral Ativan x 1        Anthoney Harada, DNP, [employer] [facility] [employer]

## 2022-09-18 NOTE — ED Provider Notes (Signed)
[place] EMERGENCY DEPARTMENT AT [facility] Provider Note   CSN: [id number] Arrival date & time: 09/18/22  1353     History  Chief Complaint  Patient presents with   Leg Swelling    [name] is a 82 y.o. female.  82 year old female with past medical history of hypothyroidism 2/2 thyroid cancer, history of PE on Coumadin, AFib, OSA on CPAP, HTN, HLD, morbid obesity presenting from urgent care for concerns of cellulitis and further complications. She was seen for this on 08/15/2022 in urgent care and started on clindamycin.  Completed treatment.  She does note that she had a biopsy performed on this leg prior to her first encounter with cellulitis.  Presented to urgent care again this a.m. for similar presentation of right leg swelling and redness going from foot to groin area x 3 days.  Subjective fever of 103.4 2 days ago, notes tylenol has been beneficial in controlling that.      Home Medications Prior to Admission medications   Medication Sig Start Date End Date Taking? Authorizing Provider  acetaminophen (TYLENOL) 500 MG tablet Take 1,000-1,500 mg by mouth as needed (back pain).    [provider]  calcium carbonate (TUMS - DOSED IN MG ELEMENTAL CALCIUM) 500 MG chewable tablet as needed. 05/09/09   [provider]  Carboxymethylcellulose Sodium (REFRESH LIQUIGEL OP) Place 1 drop into both eyes at bedtime.    [provider]  diltiazem (TIAZAC) 360 MG 24 hr capsule Take 1 capsule by mouth daily.    [provider]  doxycycline (VIBRAMYCIN) 100 MG capsule Take 1 capsule (100 mg total) by mouth 2 (two) times daily for 10 days. 09/18/22 09/28/22  Trevor Iha, FNP  ergocalciferol (VITAMIN D2) 1.25 MG (50000 UT) capsule Take 50,000 Units by mouth once a week. Patient states she doesn't take weekly like prescribed 03/16/21   [provider]  irbesartan-hydrochlorothiazide (AVALIDE) 300-12.5 MG tablet Take 0.5 tablets by  mouth daily. 09/14/18   Corky Crafts, MD  levothyroxine (SYNTHROID, LEVOTHROID) 100 MCG tablet Take 100 mcg by mouth daily before breakfast.  10/07/14   [provider]  LORazepam (ATIVAN) 1 MG tablet Take 1 mg by mouth as needed for anxiety.    [provider]  Lutein-Zeaxanthin 25-5 MG CAPS Take 1 tablet by mouth daily. EYE VITAMIN    [provider]  SIMPLY SALINE NA Place into the nose.    [provider]  triamcinolone cream (KENALOG) 0.1 % Apply topically 2 (two) times daily. 04/05/21   [provider]  warfarin (COUMADIN) 5 MG tablet Take 5 mg by mouth daily. 04/25/15   [provider]      Allergies    Codeine, Erythromycin, Tetracyclines & related, Statins, Atenolol, Citalopram hydrobromide, Coenzyme q10, Losartan potassium, and Latex    Review of Systems   Review of Systems  All other systems reviewed and are negative.  Physical Exam Updated Vital Signs BP (!) 150/66   Pulse (!) 114   Temp 98.4 F (36.9 C) (Oral)   Resp (!) 24   SpO2 92%  Physical Exam Constitutional:      Appearance: Normal appearance. She is obese.  Cardiovascular:     Rate and Rhythm: Regular rhythm. Tachycardia present.     Heart sounds: Normal heart sounds.  Pulmonary:     Effort: Pulmonary effort is normal.     Breath sounds: Normal breath sounds.  Musculoskeletal:     Cervical back: Normal range of  motion.     Right lower leg: Edema present.     Left lower leg: Edema present.  Skin:    General: Skin is warm.     Findings: Erythema and lesion present.     Comments: 6mm nonpurulent open wound on right medial lower leg     ED Results / Procedures / Treatments   Labs (all labs ordered are listed, but only abnormal results are displayed) Labs Reviewed  CULTURE, BLOOD (ROUTINE X 2)  AEROBIC CULTURE W GRAM STAIN (SUPERFICIAL SPECIMEN)  CBC WITH DIFFERENTIAL/PLATELET  LACTIC ACID, PLASMA  COMPREHENSIVE METABOLIC PANEL  PROTIME-INR     EKG EKG Interpretation  Date/Time:  Thursday September 18 2022 14:37:29 EDT Ventricular Rate:  110 PR Interval:    QRS Duration: 86 QT Interval:  345 QTC Calculation: 467 R Axis:   41 Text Interpretation: Atrial fibrillation Low voltage, precordial leads No significant change since last tracing Confirmed by Jacalyn Lefevre [phone]) on 09/18/2022 3:09:15 PM  Radiology No results found.  Procedures Procedures    Medications Ordered in ED Medications  ceFEPIme (MAXIPIME) 2 g in sodium chloride 0.9 % 100 mL IVPB (has no administration in time range)  sodium chloride 0.9 % bolus 1,000 mL (1,000 mLs Intravenous New Bag/Given 09/18/22 1504)    And  0.9 %  sodium chloride infusion (has no administration in time range)  vancomycin (VANCOREADY) IVPB 2000 mg/400 mL (has no administration in time range)    ED Course/ Medical Decision Making/ A&P                             Medical Decision Making Amount and/or Complexity of Data Reviewed Labs: ordered.  Risk Prescription drug management.  82 year old female presenting with redevelopment of cellulitis of right lower extremity likely due to poorly healed biopsy site of right medial lower leg in which she has failed clindamycin. Presented with subjective fevers at home, tachycardic on presentation.  Initiated cellulitis workup with blood cultures, broad-spectrum antibiotics of vancomycin and cefepime.  EKG revealed A-fib with a rate of 110, per chart review this is known Afib in which she is managed with diltiazem. She is on Coumadin for Afib and PE/DVT hx. Waiting on lab results however suspect patient will need admission for cellulitis.  Signed out care to Dr. Rodena Medin.        Final Clinical Impression(s) / ED Diagnoses Final diagnoses:  None    Rx / DC Orders ED Discharge Orders     None         Shelby Mattocks, DO 09/18/22 1527    Jacalyn Lefevre, MD 09/19/22 [phone]

## 2022-09-18 NOTE — ED Provider Notes (Signed)
[facility]    CSN: [id number] Arrival date & time: 09/18/22  1020      History   Chief Complaint Chief Complaint  Patient presents with   Cellulitis    HPI [name] is a 82 y.o. female.   HPI 82 year old female presents with right leg swelling and redness that goes from her foot to her groin area x 4 days.  Patient reports having fever of 103.4 on Tuesday of this week.  PMH significant for cancer, History of PE, morbid obesity, history of DVT, and ischemic colitis.  Patient was evaluated here on 08/15/2022 for similar symptoms.  Patient was then prescribed clindamycin.  Past Medical History:  Diagnosis Date   Anxiety    takes Lorazepam daily as needed   Arthritis    Cancer    thyroid   Cataracts, bilateral    immature   Chronic back pain    compressed vertebrae,takes Fosamax weekly   Family history of adverse reaction to anesthesia    granddaughter gets very sick and mean   History of blood transfusion    no abnormal reaction noted   History of bronchitis 05/2015   History of colon polyps    benign   Hyperlipidemia    takes Zetia daily   Hypertension    takes Diltiazem and Avapro daily   Hypothyroidism    takes Synthroid daily   Ischemic colitis    hx of-many yrs ago   Joint pain    Joint swelling    OSA on CPAP    05-08-12 AHi was 46, RDI  53. titrated to   9 cm water  with 3 cm flex.-nadir 75%   PE (pulmonary embolism)    takes Coumadin daily   Peripheral edema    Pneumonia 1992   hx of   Sarcoidosis    Shortness of breath    Urinary frequency    Weakness    left leg.Numbness and tingling.Has sciatica   Wheeze    occaionally. Not new    Patient Active Problem List   Diagnosis Date Noted   Chronic intermittent hypoxia with obstructive sleep apnea 09/12/2021   Severe obstructive sleep apnea-hypopnea syndrome 09/12/2021   Acute medial meniscus tear of right knee 03/31/2018   Acute pain of right knee 01/12/2018    Hypercoagulation syndrome 05/24/2014   Hunter's glossitis 05/24/2014   Sarcoidosis 05/24/2014   Hypoxemia 05/24/2014   Essential hypertension, benign 10/13/2013   Nocturnal hypoxia 05/19/2013   Obstructive sleep apnea (adult) (pediatric) 05/19/2013   Obesity 05/19/2013   OSA on CPAP    Adenopathy 05/26/2012   Cough 05/20/2012   OSA (obstructive sleep apnea) 05/20/2012   Pulmonary embolism 04/29/2012   DVT (deep venous thrombosis) 04/29/2012    Past Surgical History:  Procedure Laterality Date   ABDOMINAL HYSTERECTOMY     APPENDECTOMY     BREAST BIOPSY     biopsies on right x 2   BREAST CYST ASPIRATION Left    CHOLECYSTECTOMY N/A 11/29/2015   Procedure: LAPAROSCOPIC CHOLECYSTECTOMY;  Surgeon: Almond LintFaera Byerly, MD;  Location: [facility] OR;  Service: General;  Laterality: N/A;   COLONOSCOPY     DILATION AND CURETTAGE OF UTERUS     LIVER BIOPSY     MELANOMA EXCISION     removed from left leg   scalene surgery  1972   "trying to find out what was wrong with her lungs"   THYROID SURGERY      OB History   No  obstetric history on file.      Home Medications    Prior to Admission medications   Medication Sig Start Date End Date Taking? Authorizing Provider  acetaminophen (TYLENOL) 500 MG tablet Take 1,000-1,500 mg by mouth as needed (back pain).   Yes [provider]  calcium carbonate (TUMS - DOSED IN MG ELEMENTAL CALCIUM) 500 MG chewable tablet as needed. 05/09/09  Yes [provider]  Carboxymethylcellulose Sodium (REFRESH LIQUIGEL OP) Place 1 drop into both eyes at bedtime.   Yes [provider]  diltiazem (TIAZAC) 360 MG 24 hr capsule Take 1 capsule by mouth daily.   Yes [provider]  doxycycline (VIBRAMYCIN) 100 MG capsule Take 1 capsule (100 mg total) by mouth 2 (two) times daily for 10 days. 09/18/22 09/28/22 Yes Trevor Iha, FNP  ergocalciferol (VITAMIN D2) 1.25 MG (50000 UT) capsule Take 50,000 Units by mouth once a week. Patient states  she doesn't take weekly like prescribed 03/16/21  Yes [provider]  irbesartan-hydrochlorothiazide (AVALIDE) 300-12.5 MG tablet Take 0.5 tablets by mouth daily. 09/14/18  Yes Corky Crafts, MD  levothyroxine (SYNTHROID, LEVOTHROID) 100 MCG tablet Take 100 mcg by mouth daily before breakfast.  10/07/14  Yes [provider]  LORazepam (ATIVAN) 1 MG tablet Take 1 mg by mouth as needed for anxiety.   Yes [provider]  Lutein-Zeaxanthin 25-5 MG CAPS Take 1 tablet by mouth daily. EYE VITAMIN   Yes [provider]  SIMPLY SALINE NA Place into the nose.   Yes [provider]  triamcinolone cream (KENALOG) 0.1 % Apply topically 2 (two) times daily. 04/05/21  Yes [provider]  warfarin (COUMADIN) 5 MG tablet Take 5 mg by mouth daily. 04/25/15  Yes [provider]    Family History Family History  Problem Relation Age of Onset   Breast cancer Mother    CVA Mother    Breast cancer Sister    Breast cancer Sister    Heart attack Neg Hx    Sleep apnea Neg Hx     Social History Social History   Tobacco Use   Smoking status: Never   Smokeless tobacco: Never  Vaping Use   Vaping Use: Never used  Substance Use Topics   Alcohol use: No   Drug use: No     Allergies   Codeine, Erythromycin, Tetracyclines & related, Statins, Atenolol, Citalopram hydrobromide, Coenzyme q10, Losartan potassium, and Latex   Review of Systems Review of Systems  Skin:  Positive for rash.     Physical Exam Triage Vital Signs ED Triage Vitals  Enc Vitals Group     BP 09/18/22 1040 128/74     Pulse Rate 09/18/22 1040 (!) 104     Resp 09/18/22 1040 16     Temp 09/18/22 1040 98.7 F (37.1 C)     Temp Source 09/18/22 1040 Oral     SpO2 09/18/22 1040 94 %     Weight 09/18/22 1042 240 lb (108.9 kg)     Height 09/18/22 1042 5\' 4"  (1.626 m)     Head Circumference --      Peak Flow --      Pain Score 09/18/22 1041 0     Pain Loc --       Pain Edu? --      Excl. in GC? --    No data found.  Updated Vital Signs BP 128/74 (BP Location: Right Arm)   Pulse (!) 104   Temp 98.7  F (37.1 C) (Oral)   Resp 16   Ht 5\' 4"  (1.626 m)   Wt 240 lb (108.9 kg)   SpO2 94%   BMI 41.20 kg/m      Physical Exam Vitals and nursing note reviewed.  Constitutional:      Appearance: She is obese.  HENT:     Head: Normocephalic and atraumatic.     Mouth/Throat:     Mouth: Mucous membranes are moist.     Pharynx: Oropharynx is clear.  Eyes:     Extraocular Movements: Extraocular movements intact.     Conjunctiva/sclera: Conjunctivae normal.     Pupils: Pupils are equal, round, and reactive to light.  Cardiovascular:     Rate and Rhythm: Normal rate and regular rhythm.     Pulses: Normal pulses.     Heart sounds: Normal heart sounds.  Pulmonary:     Effort: Pulmonary effort is normal.     Breath sounds: Normal breath sounds. No wheezing, rhonchi or rales.  Musculoskeletal:        General: Normal range of motion.     Cervical back: Normal range of motion and neck supple.  Skin:    General: Skin is warm and dry.     Comments: Right upper/lower leg(ear/posterior aspects): Erythematous maculopapular eruption evident cellulitis please see images below  Neurological:     General: No focal deficit present.     Mental Status: She is alert and oriented to person, place, and time. Mental status is at baseline.           UC Treatments / Results  Labs (all labs ordered are listed, but only abnormal results are displayed) Labs Reviewed - No data to display  EKG   Radiology No results found.  Procedures Procedures (including critical care time)  Medications Ordered in UC Medications - No data to display  Initial Impression / Assessment and Plan / UC Course  I have reviewed the triage vital signs and the nursing notes.  Pertinent labs & imaging results that were available during my care of the patient were reviewed by  me and considered in my medical decision making (see chart for details).     MDM: 1.  Cellulitis of leg, right-Instructed patient to go to [facility], ED now for further evaluation of right upper/lower leg cellulitis with probable IV antibiotics to be administered.  Rx'd doxycycline 100 mg twice daily x 14 days.  Advised patient to take medication as directed with food to completion.  Encouraged increase daily water intake to 64 ounces per day while taking this medication.  Final Clinical Impressions(s) / UC Diagnoses   Final diagnoses:  Cellulitis of leg, right     Discharge Instructions      Instructed patient to go to [facility], ED now for further evaluation of right upper/lower leg cellulitis with probable IV antibiotics to be administered.  Advised patient to take medication as directed with food to completion.  Encouraged increase daily water intake to 64 ounces per day while taking this medication.     ED Prescriptions     Medication Sig Dispense Auth. Provider   doxycycline (VIBRAMYCIN) 100 MG capsule Take 1 capsule (100 mg total) by mouth 2 (two) times daily for 10 days. 20 capsule Trevor Iha, FNP      PDMP not reviewed this encounter.   Trevor Iha, FNP 09/18/22 1225

## 2022-09-18 NOTE — Discharge Instructions (Addendum)
Instructed patient to go to [facility], ED now for further evaluation of right upper/lower leg cellulitis with probable IV antibiotics to be administered.  Advised patient to take medication as directed with food to completion.  Encouraged increase daily water intake to 64 ounces per day while taking this medication.

## 2022-09-19 DIAGNOSIS — A419 Sepsis, unspecified organism: Secondary | ICD-10-CM | POA: Diagnosis not present

## 2022-09-19 DIAGNOSIS — L0390 Cellulitis, unspecified: Secondary | ICD-10-CM | POA: Diagnosis not present

## 2022-09-19 LAB — CBC
HCT: 39.3 % (ref 36.0–46.0)
Hemoglobin: 12.4 g/dL (ref 12.0–15.0)
MCH: 30 pg (ref 26.0–34.0)
MCHC: 31.6 g/dL (ref 30.0–36.0)
MCV: 95.2 fL (ref 80.0–100.0)
Platelets: 143 10*3/uL — ABNORMAL LOW (ref 150–400)
RBC: 4.13 MIL/uL (ref 3.87–5.11)
RDW: 15.9 % — ABNORMAL HIGH (ref 11.5–15.5)
WBC: 7 10*3/uL (ref 4.0–10.5)
nRBC: 0 % (ref 0.0–0.2)

## 2022-09-19 LAB — BASIC METABOLIC PANEL
Anion gap: 6 (ref 5–15)
BUN: 17 mg/dL (ref 8–23)
CO2: 27 mmol/L (ref 22–32)
Calcium: 7.8 mg/dL — ABNORMAL LOW (ref 8.9–10.3)
Chloride: 104 mmol/L (ref 98–111)
Creatinine, Ser: 0.66 mg/dL (ref 0.44–1.00)
GFR, Estimated: >60 mL/min (ref >60)
Glucose, Bld: 102 mg/dL — ABNORMAL HIGH (ref 70–99)
Potassium: 3 mmol/L — ABNORMAL LOW (ref 3.5–5.1)
Sodium: 137 mmol/L (ref 135–145)

## 2022-09-19 LAB — PROCALCITONIN: Procalcitonin: 0.73 ng/mL

## 2022-09-19 LAB — C-REACTIVE PROTEIN: CRP: 20.1 mg/dL — ABNORMAL HIGH (ref <1.0)

## 2022-09-19 LAB — PROTIME-INR
INR: 2.2 — ABNORMAL HIGH (ref 0.8–1.2)
Prothrombin Time: 23.9 seconds — ABNORMAL HIGH (ref 11.4–15.2)

## 2022-09-19 LAB — CULTURE, BLOOD (ROUTINE X 2): Culture: NO GROWTH

## 2022-09-19 LAB — AEROBIC CULTURE W GRAM STAIN (SUPERFICIAL SPECIMEN)

## 2022-09-19 LAB — SEDIMENTATION RATE: Sed Rate: 66 mm/hr — ABNORMAL HIGH (ref 0–22)

## 2022-09-19 MED ORDER — CEFAZOLIN SODIUM-DEXTROSE 2-4 GM/100ML-% IV SOLN
2.0000 g | Freq: Three times a day (TID) | INTRAVENOUS | Status: DC
  Administered 2022-09-19 – 2022-09-20 (×4): 2 g via INTRAVENOUS
  Filled 2022-09-19 (×4): qty 100

## 2022-09-19 MED ORDER — LORAZEPAM 1 MG PO TABS
1.5000 mg | ORAL_TABLET | Freq: Every day | ORAL | Status: DC
  Administered 2022-09-19 – 2022-09-25 (×7): 1.5 mg via ORAL
  Filled 2022-09-19 (×7): qty 1

## 2022-09-19 MED ORDER — IRBESARTAN 150 MG PO TABS
150.0000 mg | ORAL_TABLET | Freq: Every day | ORAL | Status: DC
  Administered 2022-09-19 – 2022-09-23 (×5): 150 mg via ORAL
  Filled 2022-09-19 (×5): qty 1

## 2022-09-19 MED ORDER — LORAZEPAM 1 MG PO TABS
1.0000 mg | ORAL_TABLET | Freq: Two times a day (BID) | ORAL | Status: DC | PRN
  Administered 2022-09-19 – 2022-09-24 (×3): 1 mg via ORAL
  Filled 2022-09-19 (×3): qty 1

## 2022-09-19 MED ORDER — SODIUM CHLORIDE 0.9 % IV SOLN: INTRAVENOUS | Status: DC

## 2022-09-19 MED ORDER — LEVOTHYROXINE SODIUM 100 MCG PO TABS
100.0000 ug | ORAL_TABLET | Freq: Every day | ORAL | Status: DC
  Administered 2022-09-19 – 2022-09-26 (×8): 100 ug via ORAL
  Filled 2022-09-19 (×8): qty 1

## 2022-09-19 MED ORDER — IRBESARTAN-HYDROCHLOROTHIAZIDE 300-12.5 MG PO TABS: 0.5000 | ORAL_TABLET | Freq: Every day | ORAL | Status: DC

## 2022-09-19 MED ORDER — POTASSIUM CHLORIDE 20 MEQ PO PACK: 40.0000 meq | PACK | ORAL | Status: DC

## 2022-09-19 MED ORDER — HYDROCHLOROTHIAZIDE 12.5 MG PO TABS
6.2500 mg | ORAL_TABLET | Freq: Every day | ORAL | Status: DC
  Administered 2022-09-19 – 2022-09-21 (×3): 6.25 mg via ORAL
  Filled 2022-09-19 (×3): qty 1

## 2022-09-19 MED ORDER — DILTIAZEM HCL ER COATED BEADS 180 MG PO CP24
360.0000 mg | ORAL_CAPSULE | Freq: Every day | ORAL | Status: DC
  Administered 2022-09-19 – 2022-09-23 (×5): 360 mg via ORAL
  Filled 2022-09-19: qty 1
  Filled 2022-09-19: qty 2
  Filled 2022-09-19: qty 1
  Filled 2022-09-19 (×2): qty 2
  Filled 2022-09-19: qty 1
  Filled 2022-09-19: qty 2

## 2022-09-19 MED ORDER — BENZOCAINE 10 % MT GEL: Freq: Three times a day (TID) | OROMUCOSAL | Status: DC | PRN

## 2022-09-19 MED ORDER — POTASSIUM CHLORIDE 20 MEQ PO PACK
40.0000 meq | PACK | ORAL | Status: AC
  Administered 2022-09-19 (×2): 40 meq via ORAL
  Filled 2022-09-19 (×2): qty 2

## 2022-09-19 NOTE — Progress Notes (Signed)
Patient refused to take medication as MD ordered and had attitude to RN when RN came to administer her night medication. Vital signs was taken and her BP was elevated. Patient raised her voice and stated that she knew her medications and she wanted to take it as her way. RN explained about the difference color of medications and read the name of each ones for her and let her repeat what she is taking at home. RN explain that they are the same medication and they are ordered by her MD after he came and talked to her in the morning. Patient has attitude to this RN and stated she did not need to hear any explain and do not have any concern now.

## 2022-09-19 NOTE — Progress Notes (Signed)
  Transition of Care (TOC) Screening Note   Patient Details  Name: HOOR METZNER Date of Birth: 1941-01-02   Transition of Care [facility]) CM/SW Contact:    Otelia Santee, LCSW Phone Number: 09/19/2022, 2:14 PM    Transition of Care Department [facility]) has reviewed patient and no TOC needs have been identified at this time. We will continue to monitor patient advancement through interdisciplinary progression rounds. If new patient transition needs arise, please place a TOC consult.

## 2022-09-19 NOTE — Progress Notes (Signed)
Mobility Specialist - Progress Note   09/19/22 0919  Mobility  Activity Ambulated with assistance in hallway  Level of Assistance Standby assist, set-up cues, supervision of patient - no hands on  Assistive Device Front wheel walker  Distance Ambulated (ft) 200 ft  Activity Response Tolerated well  Mobility Referral Yes  $Mobility charge 1 Mobility   Pt received in bed and agreed to mobility, had no issues throughout session, pt returned to chair with all needs met.  Marilynne Halsted Mobility Specialist

## 2022-09-19 NOTE — Progress Notes (Signed)
PROGRESS NOTE    [name]  ZOX:[id number] DOB: [birth year] DOA: 09/18/2022 PCP: Garlan Fillers, MD    Brief Narrative:   [name] is a 82 y.o. female with past medical history significant for DVT/PE on Coumadin, HTN, hypothyroidism, OSA on nocturnal CPAP, obesity, recent right lower extremity cellulitis treated with outpatient clindamycin who presented to [facility] ED on 4/11 complaining of right lower extremity swelling, redness that has been progressing over the last 4 days.  Reports fever of 103.4; 2 days prior to admission.  Patient reports that she completed total course of clindamycin that was prescribed by urgent care; completed roughly 3 weeks ago.  Does endorse chronic left lower extremity edema.  Originally represented to urgent care who directed her to the ED for further evaluation.  In the ED, temperature 98.5 F, HR 112, RR 18, BP 150/80, SpO2 94% on room air.  WBC 13.3, hemoglobin 13.8, platelets 147.  Sodium 136, potassium 3.4, chloride 88, CO2 29, glucose 133, BUN 25, creatinine 0.89.  AST 26, ALT 34, total bili 0.5.  Lactic acid 1.3.  INR 2.2.  Superficial wound culture done by EDP with no growth/WBCs noted on Gram stain.  Vascular duplex ultrasound right lower extremity negative for DVT.  Patient was started on IV cefepime and vancomycin.  TRH consulted for admission for further evaluation and management of recurrent right lower extremity cellulitis.  Assessment & Plan:   Right lower extremity cellulitis, recurrent Patient presenting with recurrent redness, erythema and discomfort to right lower extremity.  Recently treated with 10-day course of clindamycin outpatient by urgent care; completed 3 weeks ago.  Patient is afebrile with elevated WBC count of 13.3 on admission.  Vascular duplex ultrasound right lower extremity negative for DVT.  Lactic acid within normal limits. -- WBC 13.3>7.0 -- CRP 20.1 -- ESR 66 -- Procalcitonin 0.73 -- Blood  cultures x 2: Pending -- Cefazolin 2 g IV every 8 hours -- Tylenol as needed -- Oxycodone 5 mg p.o. every 4 hours as needed moderate pain -- CBC, monitor inflammatory markers/procalcitonin daily  Hypokalemia Potassium 3.0 on this morning, will replete. -- Repeat electrolytes in a.m. to include magnesium  Essential hypertension -- Diltiazem 360 mg p.o. daily -- Irbesartan 150 mg p.o. daily -- Hydrochlorothiazide 6.25 mg p.o. daily  Hx DVT/PE on anticoagulation -- Goal INR 2-3, INR 2.0 this morning, at goal -- Pharmacy consulted for continued dosing/monitoring Coumadin -- INR daily  Hypothyroidism -- Levothyroxine 100 mcg p.o. daily  OSA  -- Continue nocturnal CPAP  Morbid obesity Discussed with patient needs for aggressive lifestyle changes/weight loss as this complicates all facets of care.  Outpatient follow-up with PCP.       DVT prophylaxis: SCDs Start: 09/18/22 1701 warfarin (COUMADIN) tablet 5 mg    Code Status: Full Code Family Communication: No family present at bedside this morning  Disposition Plan:  Level of care: Med-Surg Status is: Inpatient Remains inpatient appropriate because: IV antibiotics    Consultants:  None  Procedures:  Vascular duplex ultrasound right lower extremity  Antimicrobials:  Clindamycin 4/11 - 4/12 Cefazolin 4/12>>   Subjective: Patient seen examined bedside, resting comfortably.  Lying in bed.  Complaining of pain and swelling to right lower extremity.  Also requesting restart of her home medications that have yet to be restarted.  No other specific questions or concerns at this time.  Denies headache, no dizziness, no chest pain, no palpitations, no shortness of breath, no abdominal pain, no fever/chills/night  sweats, no nausea/vomiting/diarrhea, no focal weakness, no fatigue, no paresthesias.  No acute events overnight per nursing staff.  Objective: Vitals:   09/18/22 2100 09/18/22 2342 09/19/22 0542 09/19/22 0808  BP:   135/70 (!) 145/87 (!) 140/75  Pulse:  (!) 102 96 93  Resp:  20 20 18   Temp:  99 F (37.2 C) 97.9 F (36.6 C) 98.1 F (36.7 C)  TempSrc:    Oral  SpO2:  94% 97% 92%  Weight: 108.9 kg     Height: 5\' 4"  (1.626 m)       Intake/Output Summary (Last 24 hours) at 09/19/2022 1143 Last data filed at 09/19/2022 0300 Gross per 24 hour  Intake 2730.88 ml  Output --  Net 2730.88 ml   Filed Weights   09/18/22 2100  Weight: 108.9 kg    Examination:  Physical Exam: GEN: NAD, alert and oriented x 3, obese HEENT: NCAT, PERRL, EOMI, sclera clear, MMM, aphthous ulcers noted hard palate PULM: CTAB w/o wheezes/crackles, normal respiratory effort CV: RRR w/o M/G/R GI: abd soft, NTND, NABS, no R/G/M MSK: Noted bilateral lower extremity peripheral edema/lymphedema, right lower extremity with erythema and mild TTP, moves all extremities independently with preserved muscle strength NEURO: No focal neurological deficits PSYCH: normal mood/affect Integumentary: Right lower extremity with erythema, edema and mild tenderness to palpation surrounding the foot ankle and lower leg, left lower extremity with noted lymphedema that is stable per patient with chronic skin changes, otherwise no other concerning rashes/lesions/wounds noted on exposed skin surfaces    Data Reviewed: I have personally reviewed following labs and imaging studies  CBC: Recent Labs  Lab 09/18/22 1519 09/19/22 0630  WBC 13.3* 7.0  NEUTROABS 10.8*  --   HGB 13.8 12.4  HCT 42.3 39.3  MCV 93.8 95.2  PLT 147* 143*   Basic Metabolic Panel: Recent Labs  Lab 09/18/22 1519 09/19/22 0630  NA 136 137  K 3.4* 3.0*  CL 88* 104  CO2 29 27  GLUCOSE 133* 102*  BUN 25* 17  CREATININE 0.89 0.66  CALCIUM 9.1 7.8*   GFR: Estimated Creatinine Clearance: 63.1 mL/min (by C-G formula based on SCr of 0.66 mg/dL). Liver Function Tests: Recent Labs  Lab 09/18/22 1519  AST 26  ALT 34  ALKPHOS 64  BILITOT 0.5  PROT 7.3  ALBUMIN  2.9*   No results for input(s): "LIPASE", "AMYLASE" in the last 168 hours. No results for input(s): "AMMONIA" in the last 168 hours. Coagulation Profile: Recent Labs  Lab 09/18/22 1519 09/19/22 0630  INR 2.0* 2.2*   Cardiac Enzymes: No results for input(s): "CKTOTAL", "CKMB", "CKMBINDEX", "TROPONINI" in the last 168 hours. BNP (last 3 results) No results for input(s): "PROBNP" in the last 8760 hours. HbA1C: No results for input(s): "HGBA1C" in the last 72 hours. CBG: No results for input(s): "GLUCAP" in the last 168 hours. Lipid Profile: No results for input(s): "CHOL", "HDL", "LDLCALC", "TRIG", "CHOLHDL", "LDLDIRECT" in the last 72 hours. Thyroid Function Tests: No results for input(s): "TSH", "T4TOTAL", "FREET4", "T3FREE", "THYROIDAB" in the last 72 hours. Anemia Panel: No results for input(s): "VITAMINB12", "FOLATE", "FERRITIN", "TIBC", "IRON", "RETICCTPCT" in the last 72 hours. Sepsis Labs: Recent Labs  Lab 09/18/22 1519 09/19/22 0720  PROCALCITON  --  0.73  LATICACIDVEN 1.3  --     Recent Results (from the past 240 hour(s))  Culture, blood (routine x 2)     Status: None (Preliminary result)   Collection Time: 09/18/22  3:00 PM   Specimen: BLOOD LEFT  HAND  Result Value Ref Range Status   Specimen Description   Final    BLOOD LEFT HAND Performed at [facility], [address] [place], [place] [address]    Special Requests   Final    BOTTLES DRAWN AEROBIC AND ANAEROBIC Blood Culture results may not be optimal due to an inadequate volume of blood received in culture bottles Performed at [facility], [address] [place], [place] [address]    Culture   Final    NO GROWTH < 12 HOURS Performed at [facility], [address] [place], [place] [address]    Report Status PENDING  Incomplete  Aerobic Culture w Gram Stain (superficial specimen)     Status: None (Preliminary result)   Collection Time: 09/18/22  4:00 PM    Specimen: Wound  Result Value Ref Range Status   Specimen Description   Final    WOUND SKIN, OTHER Performed at [facility], [address], [place], [place] [address]    Special Requests   Final    NONE Performed at [facility], [address], [place], [place] [address]    Gram Stain   Final    NO WBC SEEN NO ORGANISMS SEEN Performed at [facility], [address] [place], [place] [address]    Culture PENDING  Incomplete   Report Status PENDING  Incomplete         Radiology Studies: VAS [place] LOWER EXTREMITY VENOUS (DVT) 7a-7p  Result Date: 09/18/2022  Lower Venous DVT Study Patient Name:  [name]  Date of Exam:   09/18/2022 Medical Rec #: [id number]             Accession #:    [id number] Date of Birth: [birth year]             Patient Gender: F Patient Age:   29 years Exam Location:  [facility] Procedure:      VAS [place] LOWER EXTREMITY VENOUS (DVT) Referring Phys: JULIE HAVILAND --------------------------------------------------------------------------------  Indications: Right leg pain, erythema, cellulitis workup. Other Indications: Varicosities. Limitations: Body habitus. Patient pain/sensitivity to compression. Comparison Study: 05-07-2021 Prior left lower extremity venous study was                   negative for DVT. Performing Technologist: Jean Rosenthal RDMS, RVT  Examination Guidelines: A complete evaluation includes B-mode imaging, spectral Doppler, color Doppler, and power Doppler as needed of all accessible portions of each vessel. Bilateral testing is considered an integral part of a complete examination. Limited examinations for reoccurring indications may be performed as noted. The reflux portion of the exam is performed with the patient in reverse Trendelenburg.  +---------+---------------+---------+-----------+----------+-------------------+ RIGHT    CompressibilityPhasicitySpontaneityPropertiesThrombus  Aging      +---------+---------------+---------+-----------+----------+-------------------+ CFV      Full           Yes      Yes                                      +---------+---------------+---------+-----------+----------+-------------------+ SFJ      Full                                                             +---------+---------------+---------+-----------+----------+-------------------+  FV Prox  Full                                                             +---------+---------------+---------+-----------+----------+-------------------+ FV Mid   Full                                                             +---------+---------------+---------+-----------+----------+-------------------+ FV DistalFull                                                             +---------+---------------+---------+-----------+----------+-------------------+ PFV      Full                                                             +---------+---------------+---------+-----------+----------+-------------------+ POP      Full           Yes      Yes                                      +---------+---------------+---------+-----------+----------+-------------------+ PTV      Full                                                             +---------+---------------+---------+-----------+----------+-------------------+ PERO                    Yes      Yes                  Not well visualized +---------+---------------+---------+-----------+----------+-------------------+ Gastroc  Full                                                             +---------+---------------+---------+-----------+----------+-------------------+   +----+---------------+---------+-----------+----------+--------------+ LEFTCompressibilityPhasicitySpontaneityPropertiesThrombus Aging +----+---------------+---------+-----------+----------+--------------+ CFV Full            Yes      Yes                                 +----+---------------+---------+-----------+----------+--------------+     Summary: RIGHT: - There is no evidence of deep vein thrombosis in the lower extremity. However, portions of this examination were limited- see technologist comments above.  - No cystic structure found in  the popliteal fossa.  LEFT: - No evidence of common femoral vein obstruction.  *See table(s) above for measurements and observations. Electronically signed by Heath Lark on 09/18/2022 at 4:51:31 PM.    Final         Scheduled Meds:  diltiazem  360 mg Oral Daily   docusate sodium  100 mg Oral BID   irbesartan  150 mg Oral QHS   And   hydrochlorothiazide  6.25 mg Oral QHS   levothyroxine  100 mcg Oral Q0600   LORazepam  1.5 mg Oral QHS   potassium chloride  40 mEq Oral Q4H   warfarin  5 mg Oral q1600   Warfarin - Pharmacist Dosing Inpatient   Does not apply q1600   Continuous Infusions:  sodium chloride 75 mL/hr at 09/19/22 0837    ceFAZolin (ANCEF) IV 2 g (09/19/22 1138)     LOS: 1 day    Time spent: 52 minutes spent on chart review, discussion with nursing staff, consultants, updating family and interview/physical exam; more than 50% of that time was spent in counseling and/or coordination of care.    Alvira Philips [place], DO [facility] Available via Epic secure chat 7am-7pm After these hours, please refer to coverage provider listed on [email or web address] 09/19/2022, 11:43 AM

## 2022-09-19 NOTE — Progress Notes (Signed)
ANTICOAGULATION CONSULT NOTE  Pharmacy Consult for warfarin Indication: atrial fibrillation, h/o VTE  Allergies  Allergen Reactions   Codeine Other (See Comments)    Caused Hyperactivity and Dysphoria   Erythromycin Itching, Anxiety and Other (See Comments)    Also with jitters   Tetracyclines & Related Itching    Also with jitters   Statins Other (See Comments)    Leg muscle weakness   Atenolol Other (See Comments)    Depression    Citalopram Hydrobromide Nausea Only and Other (See Comments)    Nausea/Fatigue   Clindamycin/Lincomycin Other (See Comments)    Via IV, has caused a metallic taste in the mouth    Coenzyme Q10 Itching   Losartan Potassium Other (See Comments)    Muscle aches    Motrin [Ibuprofen] Other (See Comments)    Is to not take this   Rosuvastatin Other (See Comments)    Myalgias   Simvastatin Other (See Comments)    Myalgias   Tape     PAPER TAPE IS PPEFERRED   Latex Rash    Vital Signs: Temp: 98.1 F (36.7 C) (04/12 0808) Temp Source: Oral (04/12 0808) BP: 140/75 (04/12 0808) Pulse Rate: 93 (04/12 0808)  Labs: Recent Labs    09/18/22 1519 09/19/22 0630  HGB 13.8 12.4  HCT 42.3 39.3  PLT 147* 143*  LABPROT 22.1* 23.9*  INR 2.0* 2.2*  CREATININE 0.89  --      Estimated Creatinine Clearance: 56.8 mL/min (by C-G formula based on SCr of 0.89 mg/dL).   Medical History: Past Medical History:  Diagnosis Date   Anxiety    takes Lorazepam daily as needed   Arthritis    Cancer    thyroid   Cataracts, bilateral    immature   Chronic back pain    compressed vertebrae,takes Fosamax weekly   Family history of adverse reaction to anesthesia    granddaughter gets very sick and mean   History of blood transfusion    no abnormal reaction noted   History of bronchitis 05/2015   History of colon polyps    benign   Hyperlipidemia    takes Zetia daily   Hypertension    takes Diltiazem and Avapro daily   Hypothyroidism    takes  Synthroid daily   Ischemic colitis    hx of-many yrs ago   Joint pain    Joint swelling    OSA on CPAP    05-08-12 AHi was 46, RDI  53. titrated to   9 cm water  with 3 cm flex.-nadir 75%   PE (pulmonary embolism)    takes Coumadin daily   Peripheral edema    Pneumonia 1992   hx of   Sarcoidosis    Shortness of breath    Urinary frequency    Weakness    left leg.Numbness and tingling.Has sciatica   Wheeze    occaionally. Not new     Assessment: 82 year old female presented with right leg swelling and redness. Per ED note, patient reports being seen for same in early March and completing treatment with clindamycin. She notes fever PTA. She was sent from urgent care to ED for further treatment; she was started on IV antibiotics. Patient also with history of VTE and atrial fibrillation for which she is on warfarin. On 4/11 pharmacist spoke with patient who states that she takes warfarin 5 mg daily with last dose PTA 4/10.  INR therapeutic and relatively stable over the past 2  days.  Pltc slightly low but >100K   Concomitant antibiotic (clindamycin) noted.  Goal of Therapy:  INR 2-3    Plan:  -Continue warfarin at home dose (5 mg daily) -Recheck INR 4/14. -Monitor for signs/symptoms of bleeding   Nayquan Evinger, Ky Barban, PharmD, BCPS Clinical Pharmacist 09/19/2022 8:10 AM

## 2022-09-19 NOTE — Plan of Care (Signed)

## 2022-09-20 ENCOUNTER — Inpatient Hospital Stay (HOSPITAL_COMMUNITY): Payer: Medicare HMO

## 2022-09-20 DIAGNOSIS — A419 Sepsis, unspecified organism: Secondary | ICD-10-CM | POA: Diagnosis not present

## 2022-09-20 DIAGNOSIS — L0390 Cellulitis, unspecified: Secondary | ICD-10-CM | POA: Diagnosis not present

## 2022-09-20 DIAGNOSIS — R0609 Other forms of dyspnea: Secondary | ICD-10-CM | POA: Diagnosis not present

## 2022-09-20 LAB — BASIC METABOLIC PANEL
Anion gap: 7 (ref 5–15)
BUN: 11 mg/dL (ref 8–23)
CO2: 24 mmol/L (ref 22–32)
Calcium: 7.6 mg/dL — ABNORMAL LOW (ref 8.9–10.3)
Chloride: 103 mmol/L (ref 98–111)
Creatinine, Ser: 0.55 mg/dL (ref 0.44–1.00)
GFR, Estimated: >60 mL/min (ref >60)
Glucose, Bld: 128 mg/dL — ABNORMAL HIGH (ref 70–99)
Potassium: 3.4 mmol/L — ABNORMAL LOW (ref 3.5–5.1)
Sodium: 134 mmol/L — ABNORMAL LOW (ref 135–145)

## 2022-09-20 LAB — ECHOCARDIOGRAM COMPLETE
AR max vel: 2.1 cm2
AV Area VTI: 2.14 cm2
AV Area mean vel: 2.14 cm2
AV Mean grad: 6 mmHg
AV Peak grad: 10.1 mmHg
Ao pk vel: 1.59 m/s
Area-P 1/2: 5.84 cm2
Height: 64 in
S' Lateral: 3.6 cm
Weight: 3841.3 oz

## 2022-09-20 LAB — CBC
HCT: 37.7 % (ref 36.0–46.0)
Hemoglobin: 12.3 g/dL (ref 12.0–15.0)
MCH: 30.6 pg (ref 26.0–34.0)
MCHC: 32.6 g/dL (ref 30.0–36.0)
MCV: 93.8 fL (ref 80.0–100.0)
Platelets: 164 10*3/uL (ref 150–400)
RBC: 4.02 MIL/uL (ref 3.87–5.11)
RDW: 16 % — ABNORMAL HIGH (ref 11.5–15.5)
WBC: 11.1 10*3/uL — ABNORMAL HIGH (ref 4.0–10.5)
nRBC: 0 % (ref 0.0–0.2)

## 2022-09-20 LAB — CULTURE, BLOOD (ROUTINE X 2)

## 2022-09-20 LAB — URINALYSIS, W/ REFLEX TO CULTURE (INFECTION SUSPECTED)
Bacteria, UA: NONE SEEN
Bilirubin Urine: NEGATIVE
Glucose, UA: NEGATIVE mg/dL
Hgb urine dipstick: NEGATIVE
Ketones, ur: NEGATIVE mg/dL
Leukocytes,Ua: NEGATIVE
Nitrite: NEGATIVE
Protein, ur: NEGATIVE mg/dL
Specific Gravity, Urine: 1.005 (ref 1.005–1.030)
pH: 5 (ref 5.0–8.0)

## 2022-09-20 LAB — PROCALCITONIN: Procalcitonin: 0.33 ng/mL

## 2022-09-20 LAB — AEROBIC CULTURE W GRAM STAIN (SUPERFICIAL SPECIMEN): Gram Stain: NONE SEEN

## 2022-09-20 LAB — SEDIMENTATION RATE: Sed Rate: 74 mm/hr — ABNORMAL HIGH (ref 0–22)

## 2022-09-20 LAB — MAGNESIUM: Magnesium: 1.8 mg/dL (ref 1.7–2.4)

## 2022-09-20 LAB — C-REACTIVE PROTEIN: CRP: 15.2 mg/dL — ABNORMAL HIGH (ref <1.0)

## 2022-09-20 MED ORDER — MAGNESIUM SULFATE 2 GM/50ML IV SOLN
2.0000 g | Freq: Once | INTRAVENOUS | Status: AC
  Administered 2022-09-20: 2 g via INTRAVENOUS
  Filled 2022-09-20: qty 50

## 2022-09-20 MED ORDER — SODIUM CHLORIDE 0.9 % IV SOLN
2.0000 g | Freq: Three times a day (TID) | INTRAVENOUS | Status: DC
  Administered 2022-09-21 – 2022-09-23 (×7): 2 g via INTRAVENOUS
  Filled 2022-09-20 (×7): qty 12.5

## 2022-09-20 MED ORDER — ACETAMINOPHEN 325 MG PO TABS
650.0000 mg | ORAL_TABLET | Freq: Once | ORAL | Status: AC
  Administered 2022-09-20: 650 mg via ORAL
  Filled 2022-09-20: qty 2

## 2022-09-20 MED ORDER — METOPROLOL TARTRATE 12.5 MG HALF TABLET: 12.5000 mg | ORAL_TABLET | Freq: Two times a day (BID) | ORAL | Status: DC

## 2022-09-20 MED ORDER — SODIUM CHLORIDE 0.9 % IV SOLN
2.0000 g | Freq: Once | INTRAVENOUS | Status: AC
  Administered 2022-09-20: 2 g via INTRAVENOUS
  Filled 2022-09-20: qty 12.5

## 2022-09-20 MED ORDER — FUROSEMIDE 10 MG/ML IJ SOLN
40.0000 mg | Freq: Once | INTRAMUSCULAR | Status: AC
  Administered 2022-09-20: 40 mg via INTRAVENOUS
  Filled 2022-09-20: qty 4

## 2022-09-20 MED ORDER — PERFLUTREN LIPID MICROSPHERE
1.0000 mL | INTRAVENOUS | Status: AC | PRN
  Administered 2022-09-20: 2 mL via INTRAVENOUS

## 2022-09-20 MED ORDER — METOPROLOL TARTRATE 25 MG PO TABS
12.5000 mg | ORAL_TABLET | Freq: Two times a day (BID) | ORAL | Status: DC
  Administered 2022-09-20: 12.5 mg via ORAL
  Filled 2022-09-20: qty 1

## 2022-09-20 MED ORDER — METOPROLOL TARTRATE 5 MG/5ML IV SOLN
5.0000 mg | Freq: Once | INTRAVENOUS | Status: AC
  Administered 2022-09-20: 5 mg via INTRAVENOUS
  Filled 2022-09-20: qty 5

## 2022-09-20 MED ORDER — LINEZOLID 600 MG/300ML IV SOLN: 600.0000 mg | Freq: Two times a day (BID) | INTRAVENOUS | Status: DC

## 2022-09-20 MED ORDER — POTASSIUM CHLORIDE 20 MEQ PO PACK
40.0000 meq | PACK | ORAL | Status: AC
  Administered 2022-09-20 (×2): 40 meq via ORAL
  Filled 2022-09-20 (×2): qty 2

## 2022-09-20 MED ORDER — LINEZOLID 600 MG/300ML IV SOLN
600.0000 mg | Freq: Two times a day (BID) | INTRAVENOUS | Status: DC
  Administered 2022-09-20 – 2022-09-23 (×6): 600 mg via INTRAVENOUS
  Filled 2022-09-20 (×7): qty 300

## 2022-09-20 NOTE — Progress Notes (Signed)
   09/19/22 2115  Assess: MEWS Score  Temp 98.9 F (37.2 C)  BP (!) 165/101  MAP (mmHg) 119  Pulse Rate (!) 137  Resp 18  SpO2 91 %  O2 Device Room Air  Assess: MEWS Score  MEWS Temp 0  MEWS Systolic 0  MEWS Pulse 3  MEWS RR 0  MEWS LOC 0  MEWS Score 3  MEWS Score Color Yellow  Assess: SIRS CRITERIA  SIRS Temperature  0  SIRS Pulse 1  SIRS Respirations  0  SIRS WBC 0  SIRS Score Sum  1   Patient's is on yellow MEWS due to elevated BP and HR. Alert and oriented x 4. Anxious at this time. Medication was given as MD ordered. Paged MD on call and notified patient's MEWS and monitor patient as yellow MEWS protocol.

## 2022-09-20 NOTE — Progress Notes (Signed)
Pharmacy Antibiotic Note  [name] is a 82 y.o. female admitted on 09/18/2022 with sepsis.  Pharmacy has been consulted for cefepime dosing. MD broadening antibiotic coverage from cefazolin to cefepime.   Today, 09/20/22 WBC trending up SCr WNL, CrCl ~63 mL/min  Plan: Cefepime 2 g IV q8h Monitor renal function, culture data  Height: 5\' 4"  (162.6 cm) Weight: 108.9 kg (240 lb 1.3 oz) IBW/kg (Calculated) : 54.7  Temp (24hrs), Avg:99.7 F (37.6 C), Min:98.2 F (36.8 C), Max:101.8 F (38.8 C)  Recent Labs  Lab 09/18/22 1519 09/19/22 0630 09/20/22 0529  WBC 13.3* 7.0 11.1*  CREATININE 0.89 0.66 0.55  LATICACIDVEN 1.3  --   --     Estimated Creatinine Clearance: 63.1 mL/min (by C-G formula based on SCr of 0.55 mg/dL).    Allergies  Allergen Reactions   Codeine Other (See Comments)    Caused Hyperactivity and Dysphoria   Erythromycin Itching, Anxiety and Other (See Comments)    Also with jitters   Tetracyclines & Related Itching    Also with jitters   Statins Other (See Comments)    Leg muscle weakness   Atenolol Other (See Comments)    Depression    Citalopram Hydrobromide Nausea Only and Other (See Comments)    Nausea/Fatigue   Clindamycin/Lincomycin Other (See Comments)    Via IV, has caused a metallic taste in the mouth    Coenzyme Q10 Itching   Losartan Potassium Other (See Comments)    Muscle aches    Motrin [Ibuprofen] Other (See Comments)    Is to not take this   Rosuvastatin Other (See Comments)    Myalgias   Simvastatin Other (See Comments)    Myalgias   Tape     PAPER TAPE IS PPEFERRED   Latex Rash    Cindi Carbon, PharmD 09/20/2022 6:21 PM

## 2022-09-20 NOTE — Progress Notes (Signed)
PROGRESS NOTE    [name]  ZOX:[id number] DOB: 1941/02/24 DOA: 09/18/2022 PCP: Garlan Fillers, MD    Brief Narrative:   [name] is a 82 y.o. female with past medical history significant for DVT/PE on Coumadin, HTN, hypothyroidism, OSA on nocturnal CPAP, obesity, recent right lower extremity cellulitis treated with outpatient clindamycin who presented to [facility] ED on 4/11 complaining of right lower extremity swelling, redness that has been progressing over the last 4 days.  Reports fever of 103.4; 2 days prior to admission.  Patient reports that she completed total course of clindamycin that was prescribed by urgent care; completed roughly 3 weeks ago.  Does endorse chronic left lower extremity edema.  Originally represented to urgent care who directed her to the ED for further evaluation.  In the ED, temperature 98.5 F, HR 112, RR 18, BP 150/80, SpO2 94% on room air.  WBC 13.3, hemoglobin 13.8, platelets 147.  Sodium 136, potassium 3.4, chloride 88, CO2 29, glucose 133, BUN 25, creatinine 0.89.  AST 26, ALT 34, total bili 0.5.  Lactic acid 1.3.  INR 2.2.  Superficial wound culture done by EDP with no growth/WBCs noted on Gram stain.  Vascular duplex ultrasound right lower extremity negative for DVT.  Patient was started on IV cefepime and vancomycin.  TRH consulted for admission for further evaluation and management of recurrent right lower extremity cellulitis.  Assessment & Plan:   Right lower extremity cellulitis, recurrent Patient presenting with recurrent redness, erythema and discomfort to right lower extremity.  Recently treated with 10-day course of clindamycin outpatient by urgent care; completed 3 weeks ago.  Patient is afebrile with elevated WBC count of 13.3 on admission.  Vascular duplex ultrasound right lower extremity negative for DVT.  Lactic acid within normal limits. -- WBC 13.3>7.0>11.1 -- CRP 20.1 -- ESR 66 -- Procalcitonin  0.73>0.33 -- Superficial wound culture: No organisms/WBCs on Gram stain, further pending -- Blood cultures x 2: No growth x 2 days -- Cefazolin 2 g IV every 8 hours -- Tylenol as needed -- Oxycodone 5 mg p.o. every 4 hours as needed moderate pain -- CBC, monitor inflammatory markers/procalcitonin daily  Dyspnea -- TTE: Pending  Hypokalemia Potassium 3.4 on this morning, will replete. -- Repeat electrolytes in a.m. to include magnesium  Essential hypertension -- Diltiazem 360 mg p.o. daily -- Irbesartan 150 mg p.o. daily -- Hydrochlorothiazide 6.25 mg p.o. daily  Hx DVT/PE on anticoagulation -- Goal INR 2-3, INR 2.0 this morning, at goal -- Pharmacy consulted for continued dosing/monitoring Coumadin -- INR daily  Hypothyroidism -- Levothyroxine 100 mcg p.o. daily  OSA  -- Continue nocturnal CPAP  Morbid obesity Discussed with patient needs for aggressive lifestyle changes/weight loss as this complicates all facets of care.  Outpatient follow-up with PCP.       DVT prophylaxis: SCDs Start: 09/18/22 1701 warfarin (COUMADIN) tablet 5 mg    Code Status: Full Code Family Communication: No family present at bedside this morning  Disposition Plan:  Level of care: Med-Surg Status is: Inpatient Remains inpatient appropriate because: IV antibiotics    Consultants:  None  Procedures:  Vascular duplex ultrasound right lower extremity  Antimicrobials:  Clindamycin 4/11 - 4/12 Cefazolin 4/12>>   Subjective: Patient seen examined bedside, resting comfortably.  Lying in bed.  Reports feeling short of breath this morning, wheezing, poor sleep overnight.  Overall continues to feel "ill".  No other specific questions or concerns at this time.  Denies headache, no dizziness,  no chest pain, no palpitations, no shortness of breath, no abdominal pain, no fever/chills/night sweats, no nausea/vomiting/diarrhea, no focal weakness, no fatigue, no paresthesias.  No acute events  overnight per nursing staff.  Objective: Vitals:   09/19/22 2315 09/20/22 0136 09/20/22 0558 09/20/22 0935  BP: (!) 153/90 (!) 144/96 (!) 160/107 135/88  Pulse: (!) 132 (!) 132 (!) 122 (!) 107  Resp:  16 18 16   Temp:  100 F (37.8 C) 99.1 F (37.3 C) 98.2 F (36.8 C)  TempSrc:  Oral Oral Oral  SpO2: 96% 91% (!) 85% 90%  Weight:      Height:        Intake/Output Summary (Last 24 hours) at 09/20/2022 1150 Last data filed at 09/20/2022 0558 Gross per 24 hour  Intake 1559.12 ml  Output --  Net 1559.12 ml   Filed Weights   09/18/22 2100  Weight: 108.9 kg    Examination:  Physical Exam: GEN: NAD, alert and oriented x 3, obese HEENT: NCAT, PERRL, EOMI, sclera clear, MMM, aphthous ulcers noted hard palate PULM: CTAB w/o wheezes/crackles, normal respiratory effort CV: RRR w/o M/G/R GI: abd soft, NTND, NABS, no R/G/M MSK: Noted bilateral lower extremity peripheral edema/lymphedema, right lower extremity with erythema and mild TTP, moves all extremities independently with preserved muscle strength NEURO: No focal neurological deficits PSYCH: normal mood/affect Integumentary: Right lower extremity with erythema, edema and mild tenderness to palpation surrounding the foot ankle and lower leg, left lower extremity with noted lymphedema that is stable per patient with chronic skin changes, otherwise no other concerning rashes/lesions/wounds noted on exposed skin surfaces    Data Reviewed: I have personally reviewed following labs and imaging studies  CBC: Recent Labs  Lab 09/18/22 1519 09/19/22 0630 09/20/22 0529  WBC 13.3* 7.0 11.1*  NEUTROABS 10.8*  --   --   HGB 13.8 12.4 12.3  HCT 42.3 39.3 37.7  MCV 93.8 95.2 93.8  PLT 147* 143* 164   Basic Metabolic Panel: Recent Labs  Lab 09/18/22 1519 09/19/22 0630 09/20/22 0529  NA 136 137 134*  K 3.4* 3.0* 3.4*  CL 88* 104 103  CO2 29 27 24   GLUCOSE 133* 102* 128*  BUN 25* 17 11  CREATININE 0.89 0.66 0.55  CALCIUM 9.1  7.8* 7.6*  MG  --   --  1.8   GFR: Estimated Creatinine Clearance: 63.1 mL/min (by C-G formula based on SCr of 0.55 mg/dL). Liver Function Tests: Recent Labs  Lab 09/18/22 1519  AST 26  ALT 34  ALKPHOS 64  BILITOT 0.5  PROT 7.3  ALBUMIN 2.9*   No results for input(s): "LIPASE", "AMYLASE" in the last 168 hours. No results for input(s): "AMMONIA" in the last 168 hours. Coagulation Profile: Recent Labs  Lab 09/18/22 1519 09/19/22 0630  INR 2.0* 2.2*   Cardiac Enzymes: No results for input(s): "CKTOTAL", "CKMB", "CKMBINDEX", "TROPONINI" in the last 168 hours. BNP (last 3 results) No results for input(s): "PROBNP" in the last 8760 hours. HbA1C: No results for input(s): "HGBA1C" in the last 72 hours. CBG: No results for input(s): "GLUCAP" in the last 168 hours. Lipid Profile: No results for input(s): "CHOL", "HDL", "LDLCALC", "TRIG", "CHOLHDL", "LDLDIRECT" in the last 72 hours. Thyroid Function Tests: No results for input(s): "TSH", "T4TOTAL", "FREET4", "T3FREE", "THYROIDAB" in the last 72 hours. Anemia Panel: No results for input(s): "VITAMINB12", "FOLATE", "FERRITIN", "TIBC", "IRON", "RETICCTPCT" in the last 72 hours. Sepsis Labs: Recent Labs  Lab 09/18/22 1519 09/19/22 0720 09/20/22 0529  PROCALCITON  --  0.73 0.33  LATICACIDVEN 1.3  --   --     Recent Results (from the past 240 hour(s))  Culture, blood (routine x 2)     Status: None (Preliminary result)   Collection Time: 09/18/22  3:00 PM   Specimen: BLOOD LEFT HAND  Result Value Ref Range Status   Specimen Description   Final    BLOOD LEFT HAND Performed at [facility], [address], [place], [place] [address]    Special Requests   Final    BOTTLES DRAWN AEROBIC AND ANAEROBIC Blood Culture results may not be optimal due to an inadequate volume of blood received in culture bottles Performed at [facility], [address], [place], [place] [address]    Culture    Final    NO GROWTH 2 DAYS Performed at [facility], [address] [place], [place] [address]    Report Status PENDING  Incomplete  Aerobic Culture w Gram Stain (superficial specimen)     Status: None (Preliminary result)   Collection Time: 09/18/22  4:00 PM   Specimen: Wound  Result Value Ref Range Status   Specimen Description   Final    WOUND SKIN, OTHER Performed at [facility], [address], [place], [place] [address]    Special Requests   Final    NONE Performed at [facility], [address] [place], [place] [address]    Gram Stain NO WBC SEEN NO ORGANISMS SEEN   Final   Culture   Final    TOO YOUNG TO READ Performed at [facility], [address], [place], [place] [address]    Report Status PENDING  Incomplete         Radiology Studies: VAS [place] LOWER EXTREMITY VENOUS (DVT) 7a-7p  Result Date: 09/18/2022  Lower Venous DVT Study Patient Name:  [name] Donaghue  Date of Exam:   09/18/2022 Medical Rec #: [id number]             Accession #:    [id number] Date of Birth: 1941/03/04             Patient Gender: F Patient Age:   59 years Exam Location:  [facility] Procedure:      VAS [place] LOWER EXTREMITY VENOUS (DVT) Referring Phys: JULIE HAVILAND --------------------------------------------------------------------------------  Indications: Right leg pain, erythema, cellulitis workup. Other Indications: Varicosities. Limitations: Body habitus. Patient pain/sensitivity to compression. Comparison Study: 05-07-2021 Prior left lower extremity venous study was                   negative for DVT. Performing Technologist: Jean Rosenthal RDMS, RVT  Examination Guidelines: A complete evaluation includes B-mode imaging, spectral Doppler, color Doppler, and power Doppler as needed of all accessible portions of each vessel. Bilateral testing is considered an integral part of a complete examination. Limited examinations for  reoccurring indications may be performed as noted. The reflux portion of the exam is performed with the patient in reverse Trendelenburg.  +---------+---------------+---------+-----------+----------+-------------------+ RIGHT    CompressibilityPhasicitySpontaneityPropertiesThrombus Aging      +---------+---------------+---------+-----------+----------+-------------------+ CFV      Full           Yes      Yes                                      +---------+---------------+---------+-----------+----------+-------------------+ SFJ  Full                                                             +---------+---------------+---------+-----------+----------+-------------------+ FV Prox  Full                                                             +---------+---------------+---------+-----------+----------+-------------------+ FV Mid   Full                                                             +---------+---------------+---------+-----------+----------+-------------------+ FV DistalFull                                                             +---------+---------------+---------+-----------+----------+-------------------+ PFV      Full                                                             +---------+---------------+---------+-----------+----------+-------------------+ POP      Full           Yes      Yes                                      +---------+---------------+---------+-----------+----------+-------------------+ PTV      Full                                                             +---------+---------------+---------+-----------+----------+-------------------+ PERO                    Yes      Yes                  Not well visualized +---------+---------------+---------+-----------+----------+-------------------+ Gastroc  Full                                                              +---------+---------------+---------+-----------+----------+-------------------+   +----+---------------+---------+-----------+----------+--------------+ LEFTCompressibilityPhasicitySpontaneityPropertiesThrombus Aging +----+---------------+---------+-----------+----------+--------------+ CFV Full           Yes      Yes                                 +----+---------------+---------+-----------+----------+--------------+  Summary: RIGHT: - There is no evidence of deep vein thrombosis in the lower extremity. However, portions of this examination were limited- see technologist comments above.  - No cystic structure found in the popliteal fossa.  LEFT: - No evidence of common femoral vein obstruction.  *See table(s) above for measurements and observations. Electronically signed by Heath Lark on 09/18/2022 at 4:51:31 PM.    Final         Scheduled Meds:  diltiazem  360 mg Oral Daily   docusate sodium  100 mg Oral BID   irbesartan  150 mg Oral QHS   And   hydrochlorothiazide  6.25 mg Oral QHS   levothyroxine  100 mcg Oral Q0600   LORazepam  1.5 mg Oral QHS   potassium chloride  40 mEq Oral Q4H   warfarin  5 mg Oral q1600   Warfarin - Pharmacist Dosing Inpatient   Does not apply q1600   Continuous Infusions:   ceFAZolin (ANCEF) IV 2 g (09/20/22 0533)     LOS: 2 days    Time spent: 52 minutes spent on chart review, discussion with nursing staff, consultants, updating family and interview/physical exam; more than 50% of that time was spent in counseling and/or coordination of care.    Alvira Philips [place], DO [facility] Available via Epic secure chat 7am-7pm After these hours, please refer to coverage provider listed on [email or web address] 09/20/2022, 11:50 AM

## 2022-09-20 NOTE — Plan of Care (Signed)

## 2022-09-20 NOTE — Progress Notes (Signed)
Pt was having shortness of breath. SpO2 85% RA. home CPAP applied. RT notified. PRN breathing treatment given. Now on 3.5L nasal cannula. SpO2 low 90s. Also, HR is elevated 120s to 130s. MD notified. Lasix IV x 1 dose given. Metoprolol IV x 1 dose given. Telemetry ordered.

## 2022-09-20 NOTE — Progress Notes (Signed)
  Echocardiogram 2D Echocardiogram has been performed.  Chaneka Trefz Wynn Banker 09/20/2022, 2:06 PM

## 2022-09-21 DIAGNOSIS — L0390 Cellulitis, unspecified: Secondary | ICD-10-CM | POA: Diagnosis not present

## 2022-09-21 DIAGNOSIS — A419 Sepsis, unspecified organism: Secondary | ICD-10-CM | POA: Diagnosis not present

## 2022-09-21 LAB — TSH: TSH: 2.282 u[IU]/mL (ref 0.350–4.500)

## 2022-09-21 LAB — BASIC METABOLIC PANEL
Anion gap: 9 (ref 5–15)
BUN: 10 mg/dL (ref 8–23)
CO2: 28 mmol/L (ref 22–32)
Calcium: 8.2 mg/dL — ABNORMAL LOW (ref 8.9–10.3)
Chloride: 99 mmol/L (ref 98–111)
Creatinine, Ser: 0.69 mg/dL (ref 0.44–1.00)
GFR, Estimated: >60 mL/min (ref >60)
Glucose, Bld: 130 mg/dL — ABNORMAL HIGH (ref 70–99)
Potassium: 3.4 mmol/L — ABNORMAL LOW (ref 3.5–5.1)
Sodium: 136 mmol/L (ref 135–145)

## 2022-09-21 LAB — MAGNESIUM: Magnesium: 2.5 mg/dL — ABNORMAL HIGH (ref 1.7–2.4)

## 2022-09-21 LAB — CBC
HCT: 39.1 % (ref 36.0–46.0)
Hemoglobin: 12.5 g/dL (ref 12.0–15.0)
MCH: 30 pg (ref 26.0–34.0)
MCHC: 32 g/dL (ref 30.0–36.0)
MCV: 94 fL (ref 80.0–100.0)
Platelets: 147 10*3/uL — ABNORMAL LOW (ref 150–400)
RBC: 4.16 MIL/uL (ref 3.87–5.11)
RDW: 16 % — ABNORMAL HIGH (ref 11.5–15.5)
WBC: 9.3 10*3/uL (ref 4.0–10.5)
nRBC: 0 % (ref 0.0–0.2)

## 2022-09-21 LAB — AEROBIC CULTURE W GRAM STAIN (SUPERFICIAL SPECIMEN)

## 2022-09-21 LAB — CULTURE, BLOOD (ROUTINE X 2)

## 2022-09-21 LAB — PROCALCITONIN: Procalcitonin: 0.22 ng/mL

## 2022-09-21 LAB — BRAIN NATRIURETIC PEPTIDE: B Natriuretic Peptide: 279 pg/mL — ABNORMAL HIGH (ref 0.0–100.0)

## 2022-09-21 LAB — PROTIME-INR
INR: 2.4 — ABNORMAL HIGH (ref 0.8–1.2)
Prothrombin Time: 26 seconds — ABNORMAL HIGH (ref 11.4–15.2)

## 2022-09-21 MED ORDER — POTASSIUM CHLORIDE 20 MEQ PO PACK
40.0000 meq | PACK | ORAL | Status: AC
  Administered 2022-09-21 (×2): 40 meq via ORAL
  Filled 2022-09-21 (×2): qty 2

## 2022-09-21 MED ORDER — FUROSEMIDE 10 MG/ML IJ SOLN
40.0000 mg | Freq: Every day | INTRAMUSCULAR | Status: DC
  Administered 2022-09-21: 40 mg via INTRAVENOUS
  Filled 2022-09-21: qty 4

## 2022-09-21 MED ORDER — CALCIUM CARBONATE ANTACID 500 MG PO CHEW: 1.0000 | CHEWABLE_TABLET | Freq: Three times a day (TID) | ORAL | Status: DC | PRN

## 2022-09-21 MED ORDER — PHENOL 1.4 % MT LIQD
1.0000 | OROMUCOSAL | Status: DC | PRN
  Administered 2022-09-21: 1 via OROMUCOSAL
  Filled 2022-09-21: qty 177

## 2022-09-21 MED ORDER — METOPROLOL TARTRATE 25 MG PO TABS
25.0000 mg | ORAL_TABLET | Freq: Two times a day (BID) | ORAL | Status: DC
  Administered 2022-09-21 (×2): 25 mg via ORAL
  Filled 2022-09-21 (×2): qty 1

## 2022-09-21 NOTE — Progress Notes (Signed)
ANTICOAGULATION CONSULT NOTE  Pharmacy Consult for warfarin Indication: atrial fibrillation, h/o VTE  Allergies  Allergen Reactions   Codeine Other (See Comments)    Caused Hyperactivity and Dysphoria   Erythromycin Itching, Anxiety and Other (See Comments)    Also with jitters   Tetracyclines & Related Itching    Also with jitters   Statins Other (See Comments)    Leg muscle weakness   Atenolol Other (See Comments)    Depression    Citalopram Hydrobromide Nausea Only and Other (See Comments)    Nausea/Fatigue   Clindamycin/Lincomycin Other (See Comments)    Via IV, has caused a metallic taste in the mouth    Coenzyme Q10 Itching   Losartan Potassium Other (See Comments)    Muscle aches    Motrin [Ibuprofen] Other (See Comments)    Is to not take this   Rosuvastatin Other (See Comments)    Myalgias   Simvastatin Other (See Comments)    Myalgias   Tape     PAPER TAPE IS PPEFERRED   Latex Rash    Vital Signs: Temp: 98.5 F (36.9 C) (04/14 0757) Temp Source: Oral (04/14 0757) BP: 144/78 (04/14 0757) Pulse Rate: 107 (04/14 0757)  Labs: Recent Labs    09/18/22 1519 09/19/22 0630 09/20/22 0529 09/21/22 0436  HGB 13.8 12.4 12.3 12.5  HCT 42.3 39.3 37.7 39.1  PLT 147* 143* 164 147*  LABPROT 22.1* 23.9*  --  26.0*  INR 2.0* 2.2*  --  2.4*  CREATININE 0.89 0.66 0.55 0.69     Estimated Creatinine Clearance: 63.1 mL/min (by C-G formula based on SCr of 0.69 mg/dL).   Medical History: Past Medical History:  Diagnosis Date   Anxiety    takes Lorazepam daily as needed   Arthritis    Cancer    thyroid   Cataracts, bilateral    immature   Chronic back pain    compressed vertebrae,takes Fosamax weekly   Family history of adverse reaction to anesthesia    granddaughter gets very sick and mean   History of blood transfusion    no abnormal reaction noted   History of bronchitis 05/2015   History of colon polyps    benign   Hyperlipidemia    takes Zetia  daily   Hypertension    takes Diltiazem and Avapro daily   Hypothyroidism    takes Synthroid daily   Ischemic colitis    hx of-many yrs ago   Joint pain    Joint swelling    OSA on CPAP    05-08-12 AHi was 46, RDI  53. titrated to   9 cm water  with 3 cm flex.-nadir 75%   PE (pulmonary embolism)    takes Coumadin daily   Peripheral edema    Pneumonia 1992   hx of   Sarcoidosis    Shortness of breath    Urinary frequency    Weakness    left leg.Numbness and tingling.Has sciatica   Wheeze    occaionally. Not new     Assessment: 82 year old female presented with right leg swelling and redness. Per ED note, patient reports being seen for same in early March and completing treatment with clindamycin. She notes fever PTA. She was sent from urgent care to ED for further treatment; she was started on IV antibiotics. Patient also with history of VTE and atrial fibrillation for which she is on warfarin. On 4/11 pharmacist spoke with patient who states that she takes warfarin  5 mg daily with last dose PTA 4/10.  Today, 09/21/22 INR 2.4 remains therapeutic CBC: Stable 90% meal intake charted No major DDI, however, antibiotics have some potential to enhance effects of warfarin  Goal of Therapy:  INR 2-3  Plan:  Continue warfarin home dose of 5 mg PO daily Recheck INR with AM labs tomorrow  Cindi Carbon, PharmD, BCPS Clinical Pharmacist 09/21/2022 11:59 AM

## 2022-09-21 NOTE — Evaluation (Signed)
Physical Therapy Evaluation Patient Details Name: [name] MRN: [id number] DOB: Jan 20, 1941 Today's Date: 09/21/2022  History of Present Illness  82 y.o. female with past medical history significant for DVT/PE on Coumadin, HTN, hypothyroidism, OSA on nocturnal CPAP, obesity, recent right lower extremity cellulitis treated with outpatient clindamycin and pt admitted 09/18/22 for recurrent right LE cellulitis and new diagnosis of systolic congestive heart failure  Clinical Impression  Pt admitted with above diagnosis.  Pt currently with functional limitations due to the deficits listed below (see PT Problem List). Pt will benefit from acute skilled PT to increase their independence and safety with mobility to allow discharge.  Pt able to use BSC without assist.  Pt ambulated in hallway and requires supplemental oxygen at this time.  Pt reports only wearing 3.5 L at night.  Pt states she does have RW at home.    SATURATION QUALIFICATIONS: (This note is used to comply with regulatory documentation for home oxygen)  Patient Saturations on Room Air at Rest = 82%  Patient Saturations on Room Air while Ambulating = n/a  Patient Saturations on 3 Liters of oxygen while Ambulating = 93%  Please briefly explain why patient needs home oxygen: to improve oxygen saturations at rest and during physical activities such as ADLs and ambulation.        Recommendations for follow up therapy are one component of a multi-disciplinary discharge planning process, led by the attending physician.  Recommendations may be updated based on patient status, additional functional criteria and insurance authorization.  Follow Up Recommendations       Assistance Recommended at Discharge PRN  Patient can return home with the following       Equipment Recommendations None recommended by PT  Recommendations for Other Services       Functional Status Assessment Patient has had a recent decline in their  functional status and demonstrates the ability to make significant improvements in function in a reasonable and predictable amount of time.     Precautions / Restrictions Precautions Precautions: Fall Precaution Comments: monitor O2      Mobility  Bed Mobility Overal bed mobility: Modified Independent                  Transfers Overall transfer level: Modified independent                      Ambulation/Gait Ambulation/Gait assistance: Supervision, Min guard Gait Distance (Feet): 340 Feet Assistive device: IV Pole, 1 person hand held assist Gait Pattern/deviations: Step-through pattern, Decreased stride length       General Gait Details: pt declined using RW but did require bil UE support, increased work of breathing and SOB observed, SpO2 93% on 3L O2 [place], provided short standing rest breaks for breathing, HR also up to 147 bpm per telemetry  Stairs            Wheelchair Mobility    Modified Rankin (Stroke Patients Only)       Balance Overall balance assessment: Needs assistance         Standing balance support: No upper extremity supported Standing balance-Leahy Scale: Fair                               Pertinent Vitals/Pain Pain Assessment Pain Assessment: Faces Faces Pain Scale: Hurts little more Pain Location: left posterior flank, observed red linear marking on skin (RN called into room to view)  Pain Descriptors / Indicators: Sore, Discomfort Pain Intervention(s): Repositioned, Monitored during session    Home Living Family/patient expects to be discharged to:: Private residence Living Arrangements: Spouse/significant other   Type of Home: House         Home Layout: One level Home Equipment: Agricultural consultant (2 wheels);Cane - single point      Prior Function Prior Level of Function : Independent/Modified Independent                     Hand Dominance        Extremity/Trunk Assessment         Lower Extremity Assessment Lower Extremity Assessment: Generalized weakness    Cervical / Trunk Assessment Cervical / Trunk Assessment: Normal  Communication   Communication: No difficulties  Cognition Arousal/Alertness: Awake/alert Behavior During Therapy: WFL for tasks assessed/performed Overall Cognitive Status: Within Functional Limits for tasks assessed                                          General Comments      Exercises     Assessment/Plan    PT Assessment Patient needs continued PT services  PT Problem List Decreased mobility;Decreased strength;Cardiopulmonary status limiting activity;Decreased knowledge of use of DME       PT Treatment Interventions Gait training;DME instruction;Therapeutic exercise;Functional mobility training;Patient/family education;Therapeutic activities    PT Goals (Current goals can be found in the Care Plan section)  Acute Rehab PT Goals PT Goal Formulation: With patient Time For Goal Achievement: 10/05/22 Potential to Achieve Goals: Good    Frequency Min 1X/week     Co-evaluation               AM-PAC PT "6 Clicks" Mobility  Outcome Measure Help needed turning from your back to your side while in a flat bed without using bedrails?: None Help needed moving from lying on your back to sitting on the side of a flat bed without using bedrails?: None Help needed moving to and from a bed to a chair (including a wheelchair)?: A Little Help needed standing up from a chair using your arms (e.g., wheelchair or bedside chair)?: A Little Help needed to walk in hospital room?: A Little Help needed climbing 3-5 steps with a railing? : A Lot 6 Click Score: 19    End of Session Equipment Utilized During Treatment: Oxygen Activity Tolerance: Patient tolerated treatment well Patient left: in bed;with call bell/phone within reach;with family/visitor present Nurse Communication: Mobility status PT Visit Diagnosis:  Difficulty in walking, not elsewhere classified (R26.2)    Time: 1028-1050 PT Time Calculation (min) (ACUTE ONLY): 22 min   Charges:   PT Evaluation $PT Eval Low Complexity: 1 Low        [name] PT, DPT Physical Therapist Acute Rehabilitation Services Office: [phone]   [name] 09/21/2022, 12:18 PM

## 2022-09-21 NOTE — Progress Notes (Signed)
PROGRESS NOTE    [name]  ZOX:[id number] DOB: 1940/09/10 DOA: 09/18/2022 PCP: Garlan Fillers, MD    Brief Narrative:   [name] is a 82 y.o. female with past medical history significant for DVT/PE on Coumadin, HTN, hypothyroidism, OSA on nocturnal CPAP, obesity, recent right lower extremity cellulitis treated with outpatient clindamycin who presented to [facility] ED on 4/11 complaining of right lower extremity swelling, redness that has been progressing over the last 4 days.  Reports fever of 103.4; 2 days prior to admission.  Patient reports that she completed total course of clindamycin that was prescribed by urgent care; completed roughly 3 weeks ago.  Does endorse chronic left lower extremity edema.  Originally represented to urgent care who directed her to the ED for further evaluation.  In the ED, temperature 98.5 F, HR 112, RR 18, BP 150/80, SpO2 94% on room air.  WBC 13.3, hemoglobin 13.8, platelets 147.  Sodium 136, potassium 3.4, chloride 88, CO2 29, glucose 133, BUN 25, creatinine 0.89.  AST 26, ALT 34, total bili 0.5.  Lactic acid 1.3.  INR 2.2.  Superficial wound culture done by EDP with no growth/WBCs noted on Gram stain.  Vascular duplex ultrasound right lower extremity negative for DVT.  Patient was started on IV cefepime and vancomycin.  TRH consulted for admission for further evaluation and management of recurrent right lower extremity cellulitis.  Assessment & Plan:   Right lower extremity cellulitis, recurrent Patient presenting with recurrent redness, erythema and discomfort to right lower extremity.  Recently treated with 10-day course of clindamycin outpatient by urgent care; completed 3 weeks ago.  Patient is afebrile with elevated WBC count of 13.3 on admission.  Vascular duplex ultrasound right lower extremity negative for DVT.  Lactic acid within normal limits. -- WBC 13.3>7.0>11.1>6.9 -- CRP 20.1>15.2 -- ESR 66>74 --  Procalcitonin 0.73>0.33>0.22 -- Superficial wound culture: Rare Pseudomonas -- Blood cultures x 2 4/11: No growth x 3 days -- Blood cultures 4/13: No growth less than 12 hours -- Zyvox 600 mg IV every 12 hours -- Cefepime 2 g IV every 8 hours -- Tylenol as needed -- Oxycodone 5 mg p.o. every 4 hours as needed moderate pain -- CBC, monitor inflammatory markers/procalcitonin daily  Systolic congestive heart failure, new diagnosis TTE 09/20/2022 with LVEF 40-50 percent, LV mildly decreased function, although poor windows and given tachycardia with recommendation of obtaining limited echo with contrast when heart rate better controlled. -- Lasix 40mg  IV q24h -- continue ARB, BB -- strict I/O, daily weights  Hypokalemia Potassium 3.4 on this morning, will replete. -- Repeat electrolytes in a.m. to include magnesium  Essential hypertension -- Diltiazem 360 mg p.o. daily -- Metoprolol tartrate 25 mg PO BID -- Irbesartan 150 mg p.o. daily -- Hydrochlorothiazide 6.25 mg p.o. daily -- Lasix 40 mg IV every 24 hours  Hx DVT/PE on anticoagulation -- Goal INR 2-3, INR 2.4 this morning, at goal -- Pharmacy consulted for continued dosing/monitoring Coumadin -- INR daily  Hypothyroidism -- Levothyroxine 100 mcg p.o. daily  OSA  -- Continue nocturnal CPAP  Morbid obesity Discussed with patient needs for aggressive lifestyle changes/weight loss as this complicates all facets of care.  Outpatient follow-up with PCP.       DVT prophylaxis: SCDs Start: 09/18/22 1701 warfarin (COUMADIN) tablet 5 mg    Code Status: Full Code Family Communication: No family present at bedside this morning  Disposition Plan:  Level of care: Progressive Status is: Inpatient Remains inpatient  appropriate because: IV antibiotics, IV diuresis, anticipate discharge home in 2-3 days    Consultants:  None  Procedures:  Vascular duplex ultrasound right lower extremity  Antimicrobials:  Clindamycin 4/11 -  4/12 Cefazolin 4/12 - 4/13 Zyvox 4/13>> Cefepime 4/13>>   Subjective: Patient seen examined bedside, resting comfortably.  Lying in bed.  Feels overall much improved today.  Now is afebrile.  Breathing also improved.  Discussed echo findings with patient.  Increasing metoprolol due to continued tachycardia which is improved from yesterday as well.  No other specific questions or concerns at this time. Denies headache, no dizziness, no chest pain, no palpitations, no shortness of breath, no abdominal pain, no fever/chills/night sweats, no nausea/vomiting/diarrhea, no focal weakness, no fatigue, no paresthesias.  No acute events overnight per nursing staff.  Objective: Vitals:   09/20/22 2029 09/21/22 0001 09/21/22 0421 09/21/22 0757  BP:  (!) 141/85 (!) 138/91 (!) 144/78  Pulse:  (!) 106 93 (!) 107  Resp:  18 20 (!) 22  Temp:  97.8 F (36.6 C) 97.6 F (36.4 C) 98.5 F (36.9 C)  TempSrc:  Oral  Oral  SpO2: 96% 95% 95% 95%  Weight:      Height:        Intake/Output Summary (Last 24 hours) at 09/21/2022 1046 Last data filed at 09/21/2022 0900 Gross per 24 hour  Intake 851.02 ml  Output 4150 ml  Net -3298.98 ml   Filed Weights   09/18/22 2100  Weight: 108.9 kg    Examination:  Physical Exam: GEN: NAD, alert and oriented x 3, obese HEENT: NCAT, PERRL, EOMI, sclera clear, MMM, aphthous ulcers noted hard palate PULM: CTAB w/o wheezes/crackles, normal respiratory effort CV: RRR w/o M/G/R GI: abd soft, NTND, NABS, no R/G/M MSK: Noted bilateral lower extremity peripheral edema/lymphedema, right lower extremity with erythema and mild TTP, moves all extremities independently with preserved muscle strength NEURO: No focal neurological deficits PSYCH: normal mood/affect Integumentary: Right lower extremity with erythema, edema and mild tenderness to palpation surrounding the foot ankle and lower leg, left lower extremity with noted lymphedema that is stable per patient with chronic  skin changes, otherwise no other concerning rashes/lesions/wounds noted on exposed skin surfaces    Data Reviewed: I have personally reviewed following labs and imaging studies  CBC: Recent Labs  Lab 09/18/22 1519 09/19/22 0630 09/20/22 0529 09/21/22 0436  WBC 13.3* 7.0 11.1* 9.3  NEUTROABS 10.8*  --   --   --   HGB 13.8 12.4 12.3 12.5  HCT 42.3 39.3 37.7 39.1  MCV 93.8 95.2 93.8 94.0  PLT 147* 143* 164 147*   Basic Metabolic Panel: Recent Labs  Lab 09/18/22 1519 09/19/22 0630 09/20/22 0529 09/21/22 0436  NA 136 137 134* 136  K 3.4* 3.0* 3.4* 3.4*  CL 88* 104 103 99  CO2 29 27 24 28   GLUCOSE 133* 102* 128* 130*  BUN 25* 17 11 10   CREATININE 0.89 0.66 0.55 0.69  CALCIUM 9.1 7.8* 7.6* 8.2*  MG  --   --  1.8 2.5*   GFR: Estimated Creatinine Clearance: 63.1 mL/min (by C-G formula based on SCr of 0.69 mg/dL). Liver Function Tests: Recent Labs  Lab 09/18/22 1519  AST 26  ALT 34  ALKPHOS 64  BILITOT 0.5  PROT 7.3  ALBUMIN 2.9*   No results for input(s): "LIPASE", "AMYLASE" in the last 168 hours. No results for input(s): "AMMONIA" in the last 168 hours. Coagulation Profile: Recent Labs  Lab 09/18/22 1519 09/19/22 0630  09/21/22 0436  INR 2.0* 2.2* 2.4*   Cardiac Enzymes: No results for input(s): "CKTOTAL", "CKMB", "CKMBINDEX", "TROPONINI" in the last 168 hours. BNP (last 3 results) No results for input(s): "PROBNP" in the last 8760 hours. HbA1C: No results for input(s): "HGBA1C" in the last 72 hours. CBG: No results for input(s): "GLUCAP" in the last 168 hours. Lipid Profile: No results for input(s): "CHOL", "HDL", "LDLCALC", "TRIG", "CHOLHDL", "LDLDIRECT" in the last 72 hours. Thyroid Function Tests: Recent Labs    09/21/22 0436  TSH 2.282   Anemia Panel: No results for input(s): "VITAMINB12", "FOLATE", "FERRITIN", "TIBC", "IRON", "RETICCTPCT" in the last 72 hours. Sepsis Labs: Recent Labs  Lab 09/18/22 1519 09/19/22 0720 09/20/22 0529  09/21/22 0436  PROCALCITON  --  0.73 0.33 0.22  LATICACIDVEN 1.3  --   --   --     Recent Results (from the past 240 hour(s))  Culture, blood (routine x 2)     Status: None (Preliminary result)   Collection Time: 09/18/22  3:00 PM   Specimen: BLOOD LEFT HAND  Result Value Ref Range Status   Specimen Description   Final    BLOOD LEFT HAND Performed at [facility], [address], [place], [place] [address]    Special Requests   Final    BOTTLES DRAWN AEROBIC AND ANAEROBIC Blood Culture results may not be optimal due to an inadequate volume of blood received in culture bottles Performed at [facility], [address] [place], [place] [address]    Culture   Final    NO GROWTH 3 DAYS Performed at [facility], [address], [place], [place] [address]    Report Status PENDING  Incomplete  Aerobic Culture w Gram Stain (superficial specimen)     Status: None   Collection Time: 09/18/22  4:00 PM   Specimen: Wound  Result Value Ref Range Status   Specimen Description   Final    WOUND SKIN, OTHER Performed at [facility], [address] [place], [place] [address]    Special Requests   Final    NONE Performed at [facility], [address] [place], [place] [address]    Gram Stain   Final    NO WBC SEEN NO ORGANISMS SEEN Performed at [facility], [address] [place], [place] [address]    Culture RARE PSEUDOMONAS AERUGINOSA  Final   Report Status 09/21/2022 FINAL  Final   Organism ID, Bacteria PSEUDOMONAS AERUGINOSA  Final      Susceptibility   Pseudomonas aeruginosa - MIC*    CEFTAZIDIME 2 SENSITIVE Sensitive     CIPROFLOXACIN <=0.25 SENSITIVE Sensitive     GENTAMICIN <=1 SENSITIVE Sensitive     IMIPENEM 2 SENSITIVE Sensitive     PIP/TAZO 8 SENSITIVE Sensitive     CEFEPIME 2 SENSITIVE Sensitive     * RARE PSEUDOMONAS AERUGINOSA  Culture, blood (Routine X 2) w Reflex to ID  Panel     Status: None (Preliminary result)   Collection Time: 09/20/22  6:09 PM   Specimen: BLOOD RIGHT ARM  Result Value Ref Range Status   Specimen Description BLOOD RIGHT ARM  Final   Special Requests   Final    BOTTLES DRAWN AEROBIC AND ANAEROBIC Blood Culture adequate volume   Culture   Final    NO GROWTH < 12 HOURS Performed at [facility], [address] [place], [place] [address]  Report Status PENDING  Incomplete  Culture, blood (Routine X 2) w Reflex to ID Panel     Status: None (Preliminary result)   Collection Time: 09/20/22  6:09 PM   Specimen: BLOOD LEFT HAND  Result Value Ref Range Status   Specimen Description BLOOD LEFT HAND  Final   Special Requests   Final    BOTTLES DRAWN AEROBIC AND ANAEROBIC Blood Culture adequate volume   Culture   Final    NO GROWTH < 12 HOURS Performed at [facility], [address], [place], [place] [address]    Report Status PENDING  Incomplete         Radiology Studies: DG CHEST PORT 1 VIEW  Result Date: 09/20/2022 CLINICAL DATA:  Shortness of breath EXAM: PORTABLE CHEST 1 VIEW COMPARISON:  CT chest dated 05/05/2012 FINDINGS: Mild patchy bilateral lower lobe opacities, raising concern for infection/pneumonia, less likely atelectasis. No pleural effusion or pneumothorax. The heart is top-normal in size. IMPRESSION: Mild patchy bilateral lower lobe opacities, raising concern for infection/pneumonia, less likely atelectasis. Electronically Signed   By: Charline Bills M.D.   On: 09/20/2022 21:11   ECHOCARDIOGRAM COMPLETE  Result Date: 09/20/2022    ECHOCARDIOGRAM REPORT   Patient Name:   JUDEEN GERALDS Deacon Date of Exam: 09/20/2022 Medical Rec #:  [id number]            Height:       64.0 in Accession #:    [id number]           Weight:       240.1 lb Date of Birth:  Feb 25, 1941            BSA:          2.114 m Patient Age:    82 years             BP:           135/88 mmHg Patient Gender: F                    HR:            124 bpm. Exam Location:  Inpatient Procedure: 2D Echo, Cardiac Doppler and Color Doppler Indications:    Dyspnea  History:        Patient has prior history of Echocardiogram examinations, most                 recent 05/17/2012. Arrythmias:Atrial Fibrillation,                 Signs/Symptoms:Dyspnea and Shortness of Breath; Risk                 Factors:Hypertension and Sleep Apnea.  Sonographer:    Lucy Antigua Referring Phys: [id number] Michal Strzelecki J [place]  Sonographer Comments: Technically difficult study due to poor echo windows. Image acquisition challenging due to patient body habitus and Image acquisition challenging due to respiratory motion. IMPRESSIONS  1. Poor echo windows  2. Challenging study despite using Definity contrast. Tachycardia and poor echo windows limit accurate assessment of LVEF. Left ventricular ejection fraction, by estimation, is 40 to 50%. The left ventricle has mildly decreased function. Left ventricular endocardial border not optimally defined to evaluate regional wall motion despite using Definity contrast. Recommend obtaining limited echo with contrast when tachycardia is resolved. There is mild left ventricular hypertrophy. Indeterminate diastolic filling due to E-A fusion.  3. Right ventricular systolic function is mildly reduced. The right ventricular size is moderately enlarged.  Tricuspid regurgitation signal is inadequate for assessing PA pressure.  4. The mitral valve was not well visualized. No evidence of mitral valve regurgitation. No evidence of mitral stenosis.  5. The aortic valve is tricuspid. Aortic valve regurgitation is not visualized. No aortic stenosis is present. Comparison(s): No prior Echocardiogram. FINDINGS  Left Ventricle: Left ventricular ejection fraction, by estimation, is 40 to 45%. The left ventricle has mildly decreased function. Left ventricular endocardial border not optimally defined to evaluate regional wall motion. Definity contrast agent was given IV to  delineate the left ventricular endocardial borders. The left ventricular internal cavity size was normal in size. There is mild left ventricular hypertrophy. Indeterminate diastolic filling due to E-A fusion. Right Ventricle: The right ventricular size is moderately enlarged. Right vetricular wall thickness was not well visualized. Right ventricular systolic function is mildly reduced. Tricuspid regurgitation signal is inadequate for assessing PA pressure. Left Atrium: Left atrial size was normal in size. Right Atrium: Right atrial size was normal in size. Pericardium: There is no evidence of pericardial effusion. Mitral Valve: The mitral valve was not well visualized. No evidence of mitral valve regurgitation. No evidence of mitral valve stenosis. Tricuspid Valve: The tricuspid valve is not well visualized. Tricuspid valve regurgitation is not demonstrated. No evidence of tricuspid stenosis. Aortic Valve: The aortic valve is tricuspid. Aortic valve regurgitation is not visualized. No aortic stenosis is present. Aortic valve mean gradient measures 6.0 mmHg. Aortic valve peak gradient measures 10.1 mmHg. Aortic valve area, by VTI measures 2.14  cm. Pulmonic Valve: The pulmonic valve was not well visualized. Pulmonic valve regurgitation is not visualized. No evidence of pulmonic stenosis. Aorta: The aortic root and ascending aorta are structurally normal, with no evidence of dilitation. Venous: The inferior vena cava was not well visualized. IAS/Shunts: No atrial level shunt detected by color flow Doppler.  LEFT VENTRICLE PLAX 2D LVIDd:         4.30 cm   Diastology LVIDs:         3.60 cm   LV e' medial:    10.60 cm/s LV PW:         1.30 cm   LV E/e' medial:  12.9 LV IVS:        1.20 cm   LV e' lateral:   10.70 cm/s LVOT diam:     2.30 cm   LV E/e' lateral: 12.8 LV SV:         61 LV SV Index:   29 LVOT Area:     4.15 cm  LEFT ATRIUM           Index LA Vol (A4C): 50.9 ml 24.07 ml/m  AORTIC VALVE AV Area (Vmax):     2.10 cm AV Area (Vmean):   2.14 cm AV Area (VTI):     2.14 cm AV Vmax:           159.00 cm/s AV Vmean:          113.000 cm/s AV VTI:            0.287 m AV Peak Grad:      10.1 mmHg AV Mean Grad:      6.0 mmHg LVOT Vmax:         80.20 cm/s LVOT Vmean:        58.300 cm/s LVOT VTI:          0.148 m LVOT/AV VTI ratio: 0.52  AORTA Ao Asc diam: 3.20 cm MITRAL VALVE MV Area (PHT): 5.84 cm  SHUNTS MV Decel Time: 130 msec     Systemic VTI:  0.15 m MV E velocity: 137.00 cm/s  Systemic Diam: 2.30 cm Vishnu Priya Mallipeddi Electronically signed by Winfield Rast Mallipeddi Signature Date/Time: 09/20/2022/2:33:04 PM    Final         Scheduled Meds:  diltiazem  360 mg Oral Daily   docusate sodium  100 mg Oral BID   furosemide  40 mg Intravenous Daily   irbesartan  150 mg Oral QHS   And   hydrochlorothiazide  6.25 mg Oral QHS   levothyroxine  100 mcg Oral Q0600   LORazepam  1.5 mg Oral QHS   metoprolol tartrate  25 mg Oral BID   potassium chloride  40 mEq Oral Q4H   warfarin  5 mg Oral q1600   Warfarin - Pharmacist Dosing Inpatient   Does not apply q1600   Continuous Infusions:  ceFEPime (MAXIPIME) IV 2 g (09/21/22 0981)   linezolid (ZYVOX) IV 600 mg (09/21/22 0817)     LOS: 3 days    Time spent: 52 minutes spent on chart review, discussion with nursing staff, consultants, updating family and interview/physical exam; more than 50% of that time was spent in counseling and/or coordination of care.    Alvira Philips [place], DO [facility] Available via Epic secure chat 7am-7pm After these hours, please refer to coverage provider listed on [email or web address] 09/21/2022, 10:46 AM

## 2022-09-22 DIAGNOSIS — A419 Sepsis, unspecified organism: Secondary | ICD-10-CM | POA: Diagnosis not present

## 2022-09-22 DIAGNOSIS — L0390 Cellulitis, unspecified: Secondary | ICD-10-CM | POA: Diagnosis not present

## 2022-09-22 LAB — BASIC METABOLIC PANEL
Anion gap: 7 (ref 5–15)
BUN: 11 mg/dL (ref 8–23)
CO2: 29 mmol/L (ref 22–32)
Calcium: 8.3 mg/dL — ABNORMAL LOW (ref 8.9–10.3)
Chloride: 96 mmol/L — ABNORMAL LOW (ref 98–111)
Creatinine, Ser: 0.67 mg/dL (ref 0.44–1.00)
GFR, Estimated: >60 mL/min (ref >60)
Glucose, Bld: 127 mg/dL — ABNORMAL HIGH (ref 70–99)
Potassium: 4 mmol/L (ref 3.5–5.1)
Sodium: 132 mmol/L — ABNORMAL LOW (ref 135–145)

## 2022-09-22 LAB — CBC
HCT: 39 % (ref 36.0–46.0)
Hemoglobin: 12.5 g/dL (ref 12.0–15.0)
MCH: 30.2 pg (ref 26.0–34.0)
MCHC: 32.1 g/dL (ref 30.0–36.0)
MCV: 94.2 fL (ref 80.0–100.0)
Platelets: 177 10*3/uL (ref 150–400)
RBC: 4.14 MIL/uL (ref 3.87–5.11)
RDW: 15.9 % — ABNORMAL HIGH (ref 11.5–15.5)
WBC: 9.4 10*3/uL (ref 4.0–10.5)
nRBC: 0 % (ref 0.0–0.2)

## 2022-09-22 LAB — PROTIME-INR
INR: 2.6 — ABNORMAL HIGH (ref 0.8–1.2)
Prothrombin Time: 27.7 seconds — ABNORMAL HIGH (ref 11.4–15.2)

## 2022-09-22 LAB — MAGNESIUM: Magnesium: 2.5 mg/dL — ABNORMAL HIGH (ref 1.7–2.4)

## 2022-09-22 LAB — BRAIN NATRIURETIC PEPTIDE: B Natriuretic Peptide: 129.9 pg/mL — ABNORMAL HIGH (ref 0.0–100.0)

## 2022-09-22 MED ORDER — WARFARIN SODIUM 2.5 MG PO TABS
2.5000 mg | ORAL_TABLET | Freq: Once | ORAL | Status: AC
  Administered 2022-09-22: 2.5 mg via ORAL
  Filled 2022-09-22: qty 1

## 2022-09-22 MED ORDER — METOPROLOL TARTRATE 50 MG PO TABS
50.0000 mg | ORAL_TABLET | Freq: Two times a day (BID) | ORAL | Status: DC
  Administered 2022-09-22 (×2): 50 mg via ORAL
  Filled 2022-09-22 (×2): qty 1

## 2022-09-22 MED ORDER — FUROSEMIDE 10 MG/ML IJ SOLN
40.0000 mg | Freq: Two times a day (BID) | INTRAMUSCULAR | Status: DC
  Administered 2022-09-22 – 2022-09-25 (×7): 40 mg via INTRAVENOUS
  Filled 2022-09-22 (×6): qty 4

## 2022-09-22 NOTE — Care Management Important Message (Signed)
Important Message  Patient Details IM Letter given. Name: [name] MRN: [id number] Date of Birth: Jun 02, 1941   Medicare Important Message Given:  Yes     Caren Macadam 09/22/2022, 1:42 PM

## 2022-09-22 NOTE — Progress Notes (Signed)
RT note: Pt./Staff placed on Home CPAP/BiPAP prior to my arrival, humidifier refilled with S/W, Oxygen remains inline/on @ 3lpm, made aware to notify if needed.

## 2022-09-22 NOTE — Evaluation (Signed)
Occupational Therapy Evaluation Patient Details Name: [name] MRN: [id number] DOB: 1940-10-07 Today's Date: 09/22/2022   History of Present Illness 82 y.o. female with past medical history significant for DVT/PE on Coumadin, HTN, hypothyroidism, OSA on nocturnal CPAP, obesity, recent right lower extremity cellulitis treated with outpatient clindamycin and pt admitted 09/18/22 for recurrent right LE cellulitis and new diagnosis of systolic congestive heart failure   Clinical Impression   Patient evaluated by Occupational Therapy with no further acute OT needs identified. All education has been completed and the patient has no further questions. Energy conservation handouts and education provided.  See below for any follow-up Occupational Therapy or equipment needs. OT is signing off. Thank you for this referral.       Recommendations for follow up therapy are one component of a multi-disciplinary discharge planning process, led by the attending physician.  Recommendations may be updated based on patient status, additional functional criteria and insurance authorization.   Assistance Recommended at Discharge Set up Supervision/Assistance  Patient can return home with the following A little help with bathing/dressing/bathroom    Functional Status Assessment  Patient has had a recent decline in their functional status and demonstrates the ability to make significant improvements in function in a reasonable and predictable amount of time.  Equipment Recommendations  Tub/shower seat    Recommendations for Other Services       Precautions / Restrictions Precautions Precautions: Fall Precaution Comments: monitor O2 Restrictions Weight Bearing Restrictions: No      Mobility Bed Mobility                    Transfers Overall transfer level: Modified independent                        Balance Overall balance assessment: Modified Independent                                          ADL either performed or assessed with clinical judgement   ADL Overall ADL's : At baseline                                       General ADL Comments: Pt able to demonstrate hygiene post toileting on BSC, BSC transfers, standing at sink for hand hygiene and LE dressing at baseline. Pt with good UE ROM and strength and a good support system with spouse. Pt was educated on adaptive equipment options for task simplification and on major points of energy conservation, including the "4 Ps", as well as pursed lip breathing.  Pt ambulated in room without AD and no LOB. Pt does fatigue quickly, especially when leaning down to floor and educated pt on adaptive methods to minimize this.     Vision   Vision Assessment?: No apparent visual deficits     Perception     Praxis      Pertinent Vitals/Pain Pain Assessment Pain Assessment: No/denies pain     Hand Dominance  (ambidextrous)   Extremity/Trunk Assessment Upper Extremity Assessment Upper Extremity Assessment: Overall WFL for tasks assessed   Lower Extremity Assessment Lower Extremity Assessment: Generalized weakness   Cervical / Trunk Assessment Cervical / Trunk Assessment: Normal   Communication Communication Communication: No difficulties   Cognition Arousal/Alertness: Awake/alert Behavior During  Therapy: WFL for tasks assessed/performed Overall Cognitive Status: Within Functional Limits for tasks assessed                                       General Comments       Exercises     Shoulder Instructions      Home Living Family/patient expects to be discharged to:: Private residence Living Arrangements: Spouse/significant other Available Help at Discharge: Family;Available 24 hours/day Type of Home: House Home Access: Stairs to enter     Home Layout: One level     Bathroom Shower/Tub: Arts development officer Toilet: Handicapped  height     Home Equipment: Agricultural consultant (2 wheels);Cane - single point          Prior Functioning/Environment Prior Level of Function : Independent/Modified Independent                        OT Problem List: Decreased activity tolerance      OT Treatment/Interventions:      OT Goals(Current goals can be found in the care plan section) Acute Rehab OT Goals Patient Stated Goal: To be able to participate in "cook day and baking day" in her community. OT Goal Formulation: All assessment and education complete, DC therapy ADL Goals Additional ADL Goal #1: Patient will identify at least 3 energy conservation strategies to employ at home in order to maximize function and quality of life and decrease caregiver burden while preventing exacerbation of symptoms and rehospitalization.  OT Frequency:      Co-evaluation              AM-PAC OT "6 Clicks" Daily Activity     Outcome Measure Help from another person eating meals?: None Help from another person taking care of personal grooming?: None Help from another person toileting, which includes using toliet, bedpan, or urinal?: None Help from another person bathing (including washing, rinsing, drying)?: A Little Help from another person to put on and taking off regular upper body clothing?: None Help from another person to put on and taking off regular lower body clothing?: A Little 6 Click Score: 22   End of Session Equipment Utilized During Treatment: Oxygen Nurse Communication: Mobility status  Activity Tolerance: Patient tolerated treatment well;Patient limited by fatigue Patient left: in chair;with call bell/phone within reach;with family/visitor present  OT Visit Diagnosis:  (Decreased ADLs)                Time: 1761-6073 OT Time Calculation (min): 30 min Charges:  OT General Charges $OT Visit: 1 Visit OT Evaluation $OT Eval Low Complexity: 1 Low OT Treatments $Self Care/Home Management : 8-22  mins  Victorino Dike, OT Acute Rehab Services Office: [phone] 09/22/2022  Theodoro Clock 09/22/2022, 2:08 PM

## 2022-09-22 NOTE — TOC Initial Note (Signed)
Transition of Care [facility]) - Initial/Assessment Note    Patient Details  Name: [name] MRN: [id number] Date of Birth: Apr 04, 1941  Transition of [facility]) CM/SW Contact:    Howell Rucks, RN Phone Number: 09/22/2022, 2:18 PM  Clinical Narrative:  CM met with pt in room for Home PT recommendation. Pt reports she is to have PT session in am and would like to see how she does before agreeing to home PT. NCM will follow up after PT session in am.                     Patient Goals and CMS Choice     Choice offered to / list presented to : Patient [place] ownership interest in [place].provided to:: Patient    Expected Discharge Plan and Services       Living arrangements for the past 2 months: Single Family Home                                      Prior Living Arrangements/Services Living arrangements for the past 2 months: Single Family Home Lives with:: Spouse Patient language and need for interpreter reviewed:: Yes Do you feel safe going back to the place where you live?: Yes      Need for Family Participation in Patient Care: Yes (Comment) Care giver support system in place?: Yes (comment)   Criminal Activity/Legal Involvement Pertinent to Current Situation/Hospitalization: No - Comment as needed  Activities of Daily Living Home Assistive Devices/Equipment: None ADL Screening (condition at time of admission) Patient's cognitive ability adequate to safely complete daily activities?: Yes Is the patient deaf or have difficulty hearing?: No Does the patient have difficulty seeing, even when wearing glasses/contacts?: No Does the patient have difficulty concentrating, remembering, or making decisions?: No Patient able to express need for assistance with ADLs?: Yes Does the patient have difficulty dressing or bathing?: No Independently performs ADLs?: Yes (appropriate for developmental age) Does the patient have difficulty walking or  climbing stairs?: No Weakness of Legs: None Weakness of Arms/Hands: None  Permission Sought/Granted Permission sought to share information with : Case Manager Permission granted to share information with : Yes, Verbal Permission Granted  Share Information with NAME: Fannie Knee, [facility]           Emotional Assessment Appearance:: Appears stated age Attitude/Demeanor/Rapport: Gracious   Orientation: : Oriented to Self, Oriented to Place, Oriented to  Time, Oriented to Situation Alcohol / Substance Use: Not Applicable Psych Involvement: No (comment)  Admission diagnosis:  Cellulitis of right leg [L03.115] Cellulitis, unspecified cellulitis site [L03.90] Patient Active Problem List   Diagnosis Date Noted   Warfarin anticoagulation 09/18/2022   Sepsis 09/18/2022   Sepsis due to cellulitis 09/18/2022   Cellulitis of right leg 09/18/2022   Chronic intermittent hypoxia with obstructive sleep apnea 09/12/2021   Severe obstructive sleep apnea-hypopnea syndrome 09/12/2021   Acute medial meniscus tear of right knee 03/31/2018   Acute pain of right knee 01/12/2018   Hypercoagulation syndrome 05/24/2014   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 [place] [address] Phone: [phone] Fax: [phone]     Social Determinants of Health (SDOH) Social History: SDOH Screenings   Food Insecurity: No Food Insecurity (09/18/2022)  Housing: Low Risk  (09/18/2022)  Transportation Needs:  No Transportation Needs (09/18/2022)  Utilities: Not At Risk (09/18/2022)  Tobacco Use: Low Risk  (09/18/2022)   SDOH Interventions: Food Insecurity Interventions: Intervention Not Indicated Housing Interventions: Intervention Not Indicated Transportation Interventions: Intervention Not Indicated Utilities Interventions: Intervention Not Indicated   Readmission Risk Interventions    09/19/2022    2:13 PM  Readmission Risk Prevention Plan  Post Dischage Appt Complete  Medication Screening Complete

## 2022-09-22 NOTE — Progress Notes (Signed)
PROGRESS NOTE    [name]  JOA:[id number] DOB: May 01, 1941 DOA: 09/18/2022 PCP: Garlan Fillers, MD    Brief Narrative:   [name] is a 82 y.o. female with past medical history significant for DVT/PE on Coumadin, HTN, hypothyroidism, OSA on nocturnal CPAP, obesity, recent right lower extremity cellulitis treated with outpatient clindamycin who presented to [facility] ED on 4/11 complaining of right lower extremity swelling, redness that has been progressing over the last 4 days.  Reports fever of 103.4; 2 days prior to admission.  Patient reports that she completed total course of clindamycin that was prescribed by urgent care; completed roughly 3 weeks ago.  Does endorse chronic left lower extremity edema.  Originally represented to urgent care who directed her to the ED for further evaluation.  In the ED, temperature 98.5 F, HR 112, RR 18, BP 150/80, SpO2 94% on room air.  WBC 13.3, hemoglobin 13.8, platelets 147.  Sodium 136, potassium 3.4, chloride 88, CO2 29, glucose 133, BUN 25, creatinine 0.89.  AST 26, ALT 34, total bili 0.5.  Lactic acid 1.3.  INR 2.2.  Superficial wound culture done by EDP with no growth/WBCs noted on Gram stain.  Vascular duplex ultrasound right lower extremity negative for DVT.  Patient was started on IV cefepime and vancomycin.  TRH consulted for admission for further evaluation and management of recurrent right lower extremity cellulitis.  Assessment & Plan:   Right lower extremity cellulitis, recurrent Patient presenting with recurrent redness, erythema and discomfort to right lower extremity.  Recently treated with 10-day course of clindamycin outpatient by urgent care; completed 3 weeks ago.  Patient is afebrile with elevated WBC count of 13.3 on admission.  Vascular duplex ultrasound right lower extremity negative for DVT.  Lactic acid within normal limits. -- WBC 13.3>7.0>11.1>9.3>9.4 -- CRP 20.1>15.2 -- ESR 66>74 --  Procalcitonin 0.73>0.33>0.22 -- Superficial wound culture: Rare Pseudomonas -- Blood cultures x 2 4/11: No growth x 4 days -- Blood cultures 4/13: No growth x 2 days -- Zyvox 600 mg IV every 12 hours -- Cefepime 2 g IV every 8 hours -- Tylenol as needed -- Oxycodone 5 mg p.o. every 4 hours as needed moderate pain -- CBC daily  Systolic congestive heart failure, new diagnosis TTE 09/20/2022 with LVEF 40-50 percent, LV mildly decreased function, although poor windows and given tachycardia with recommendation of obtaining limited echo with contrast when heart rate better controlled. -- Lasix 40mg  IV q12h -- continue ARB, BB -- strict I/O, daily weights  Hypokalemia Potassium 4.0 this morning -- Repeat electrolytes in a.m. to include magnesium  Essential hypertension -- Diltiazem 360 mg p.o. daily -- Metoprolol tartrate 25 mg PO BID -- Irbesartan 150 mg p.o. daily -- Discontinued home HCTZ -- Lasix 40 mg IV q12h  Hx DVT/PE on anticoagulation -- Goal INR 2-3, INR 2.4 this morning, at goal -- Pharmacy consulted for continued dosing/monitoring Coumadin -- INR daily  Hypothyroidism -- Levothyroxine 100 mcg p.o. daily  OSA  -- Continue nocturnal CPAP  Morbid obesity Discussed with patient needs for aggressive lifestyle changes/weight loss as this complicates all facets of care.  Outpatient follow-up with PCP.       DVT prophylaxis: SCDs Start: 09/18/22 1701 warfarin (COUMADIN) tablet 2.5 mg    Code Status: Full Code Family Communication: No family present at bedside this morning  Disposition Plan:  Level of care: Progressive Status is: Inpatient Remains inpatient appropriate because: IV antibiotics, IV diuresis, anticipate discharge home in 2-3  days    Consultants:  None  Procedures:  Vascular duplex ultrasound right lower extremity  Antimicrobials:  Clindamycin 4/11 - 4/12 Cefazolin 4/12 - 4/13 Zyvox 4/13>> Cefepime 4/13>>   Subjective: Patient seen  examined bedside, resting comfortably.  Lying in bed.  Breathing stable.  Heart rate improved.  Will increase Lasix to 40 mg IV every 12 hours.  Remains on oxygen.  Hopefully with additional diuresis will be able to titrate off as not oxygen dependent at baseline.   No other specific questions or concerns at this time. Denies headache, no dizziness, no chest pain, no palpitations, no shortness of breath, no abdominal pain, no fever/chills/night sweats, no nausea/vomiting/diarrhea, no focal weakness, no fatigue, no paresthesias.  No acute events overnight per nursing staff.  Objective: Vitals:   09/21/22 1402 09/21/22 2041 09/22/22 0544 09/22/22 0655  BP: 135/87 111/70 (!) 143/91   Pulse: 89 100 97   Resp: 20 18 18    Temp: 98.4 F (36.9 C) 98.4 F (36.9 C) 97.7 F (36.5 C)   TempSrc: Oral Oral Oral   SpO2: 95% 95% 93%   Weight:    110.7 kg  Height:        Intake/Output Summary (Last 24 hours) at 09/22/2022 1221 Last data filed at 09/22/2022 0900 Gross per 24 hour  Intake 1244.9 ml  Output 2000 ml  Net -755.1 ml   Filed Weights   09/18/22 2100 09/22/22 0655  Weight: 108.9 kg 110.7 kg    Examination:  Physical Exam: GEN: NAD, alert and oriented x 3, obese HEENT: NCAT, PERRL, EOMI, sclera clear, MMM, aphthous ulcers noted hard palate PULM: CTAB w/o wheezes/crackles, normal respiratory effort, on 3 L nasal cannula with SpO2 93% at rest CV: RRR w/o M/G/R GI: abd soft, NTND, NABS, no R/G/M MSK: Noted bilateral lower extremity peripheral edema/lymphedema, right lower extremity with erythema and mild TTP, moves all extremities independently with preserved muscle strength NEURO: No focal neurological deficits PSYCH: normal mood/affect Integumentary: Right lower extremity with erythema, edema and mild tenderness to palpation surrounding the foot ankle and lower leg that is improving, left lower extremity with noted lymphedema that is stable per patient with chronic skin changes, otherwise  no other concerning rashes/lesions/wounds noted on exposed skin surfaces    Data Reviewed: I have personally reviewed following labs and imaging studies  CBC: Recent Labs  Lab 09/18/22 1519 09/19/22 0630 09/20/22 0529 09/21/22 0436 09/22/22 0427  WBC 13.3* 7.0 11.1* 9.3 9.4  NEUTROABS 10.8*  --   --   --   --   HGB 13.8 12.4 12.3 12.5 12.5  HCT 42.3 39.3 37.7 39.1 39.0  MCV 93.8 95.2 93.8 94.0 94.2  PLT 147* 143* 164 147* 177   Basic Metabolic Panel: Recent Labs  Lab 09/18/22 1519 09/19/22 0630 09/20/22 0529 09/21/22 0436 09/22/22 0427  NA 136 137 134* 136 132*  K 3.4* 3.0* 3.4* 3.4* 4.0  CL 88* 104 103 99 96*  CO2 29 27 24 28 29   GLUCOSE 133* 102* 128* 130* 127*  BUN 25* 17 11 10 11   CREATININE 0.89 0.66 0.55 0.69 0.67  CALCIUM 9.1 7.8* 7.6* 8.2* 8.3*  MG  --   --  1.8 2.5* 2.5*   GFR: Estimated Creatinine Clearance: 63.7 mL/min (by C-G formula based on SCr of 0.67 mg/dL). Liver Function Tests: Recent Labs  Lab 09/18/22 1519  AST 26  ALT 34  ALKPHOS 64  BILITOT 0.5  PROT 7.3  ALBUMIN 2.9*   No results  for input(s): "LIPASE", "AMYLASE" in the last 168 hours. No results for input(s): "AMMONIA" in the last 168 hours. Coagulation Profile: Recent Labs  Lab 09/18/22 1519 09/19/22 0630 09/21/22 0436 09/22/22 0427  INR 2.0* 2.2* 2.4* 2.6*   Cardiac Enzymes: No results for input(s): "CKTOTAL", "CKMB", "CKMBINDEX", "TROPONINI" in the last 168 hours. BNP (last 3 results) No results for input(s): "PROBNP" in the last 8760 hours. HbA1C: No results for input(s): "HGBA1C" in the last 72 hours. CBG: No results for input(s): "GLUCAP" in the last 168 hours. Lipid Profile: No results for input(s): "CHOL", "HDL", "LDLCALC", "TRIG", "CHOLHDL", "LDLDIRECT" in the last 72 hours. Thyroid Function Tests: Recent Labs    09/21/22 0436  TSH 2.282   Anemia Panel: No results for input(s): "VITAMINB12", "FOLATE", "FERRITIN", "TIBC", "IRON", "RETICCTPCT" in the last  72 hours. Sepsis Labs: Recent Labs  Lab 09/18/22 1519 09/19/22 0720 09/20/22 0529 09/21/22 0436  PROCALCITON  --  0.73 0.33 0.22  LATICACIDVEN 1.3  --   --   --     Recent Results (from the past 240 hour(s))  Culture, blood (routine x 2)     Status: None (Preliminary result)   Collection Time: 09/18/22  3:00 PM   Specimen: BLOOD LEFT HAND  Result Value Ref Range Status   Specimen Description   Final    BLOOD LEFT HAND Performed at [facility], [address], [place], [place] [address]    Special Requests   Final    BOTTLES DRAWN AEROBIC AND ANAEROBIC Blood Culture results may not be optimal due to an inadequate volume of blood received in culture bottles Performed at [facility], [address], [place], [place] [address]    Culture   Final    NO GROWTH 4 DAYS Performed at [facility], [address] [place], [place] [address]    Report Status PENDING  Incomplete  Aerobic Culture w Gram Stain (superficial specimen)     Status: None   Collection Time: 09/18/22  4:00 PM   Specimen: Wound  Result Value Ref Range Status   Specimen Description   Final    WOUND SKIN, OTHER Performed at [facility], [address] [place], [place] [address]    Special Requests   Final    NONE Performed at [facility], [address] [place], [place] [address]    Gram Stain   Final    NO WBC SEEN NO ORGANISMS SEEN Performed at [facility], [address], [place], [place] [address]    Culture RARE PSEUDOMONAS AERUGINOSA  Final   Report Status 09/21/2022 FINAL  Final   Organism ID, Bacteria PSEUDOMONAS AERUGINOSA  Final      Susceptibility   Pseudomonas aeruginosa - MIC*    CEFTAZIDIME 2 SENSITIVE Sensitive     CIPROFLOXACIN <=0.25 SENSITIVE Sensitive     GENTAMICIN <=1 SENSITIVE Sensitive     IMIPENEM 2 SENSITIVE Sensitive     PIP/TAZO 8 SENSITIVE Sensitive     CEFEPIME 2 SENSITIVE  Sensitive     * RARE PSEUDOMONAS AERUGINOSA  Culture, blood (Routine X 2) w Reflex to ID Panel     Status: None (Preliminary result)   Collection Time: 09/20/22  6:09 PM   Specimen: BLOOD RIGHT ARM  Result Value Ref Range Status   Specimen Description BLOOD RIGHT ARM  Final   Special Requests   Final    BOTTLES DRAWN AEROBIC AND ANAEROBIC Blood Culture  adequate volume   Culture   Final    NO GROWTH 2 DAYS Performed at [facility], [address] [place], [place] [address]    Report Status PENDING  Incomplete  Culture, blood (Routine X 2) w Reflex to ID Panel     Status: None (Preliminary result)   Collection Time: 09/20/22  6:09 PM   Specimen: BLOOD LEFT HAND  Result Value Ref Range Status   Specimen Description BLOOD LEFT HAND  Final   Special Requests   Final    BOTTLES DRAWN AEROBIC AND ANAEROBIC Blood Culture adequate volume   Culture   Final    NO GROWTH 2 DAYS Performed at [facility], [address] [place], [place] [address]    Report Status PENDING  Incomplete         Radiology Studies: DG CHEST PORT 1 VIEW  Result Date: 09/20/2022 CLINICAL DATA:  Shortness of breath EXAM: PORTABLE CHEST 1 VIEW COMPARISON:  CT chest dated 05/05/2012 FINDINGS: Mild patchy bilateral lower lobe opacities, raising concern for infection/pneumonia, less likely atelectasis. No pleural effusion or pneumothorax. The heart is top-normal in size. IMPRESSION: Mild patchy bilateral lower lobe opacities, raising concern for infection/pneumonia, less likely atelectasis. Electronically Signed   By: Charline Bills M.D.   On: 09/20/2022 21:11   ECHOCARDIOGRAM COMPLETE  Result Date: 09/20/2022    ECHOCARDIOGRAM REPORT   Patient Name:   ADAJAH COCKING Siravo Date of Exam: 09/20/2022 Medical Rec #:  [id number]            Height:       64.0 in Accession #:    [id number]           Weight:       240.1 lb Date of Birth:  1940-08-02            BSA:          2.114 m Patient Age:    82 years              BP:           135/88 mmHg Patient Gender: F                    HR:           124 bpm. Exam Location:  Inpatient Procedure: 2D Echo, Cardiac Doppler and Color Doppler Indications:    Dyspnea  History:        Patient has prior history of Echocardiogram examinations, most                 recent 05/17/2012. Arrythmias:Atrial Fibrillation,                 Signs/Symptoms:Dyspnea and Shortness of Breath; Risk                 Factors:Hypertension and Sleep Apnea.  Sonographer:    Lucy Antigua Referring Phys: [id number] Zionna Homewood J [place]  Sonographer Comments: Technically difficult study due to poor echo windows. Image acquisition challenging due to patient body habitus and Image acquisition challenging due to respiratory motion. IMPRESSIONS  1. Poor echo windows  2. Challenging study despite using Definity contrast. Tachycardia and poor echo windows limit accurate assessment of LVEF. Left ventricular ejection fraction, by estimation, is 40 to 50%. The left ventricle has mildly decreased function. Left ventricular endocardial border not optimally defined to evaluate regional wall motion despite using Definity contrast. Recommend obtaining limited echo with contrast when tachycardia is  resolved. There is mild left ventricular hypertrophy. Indeterminate diastolic filling due to E-A fusion.  3. Right ventricular systolic function is mildly reduced. The right ventricular size is moderately enlarged. Tricuspid regurgitation signal is inadequate for assessing PA pressure.  4. The mitral valve was not well visualized. No evidence of mitral valve regurgitation. No evidence of mitral stenosis.  5. The aortic valve is tricuspid. Aortic valve regurgitation is not visualized. No aortic stenosis is present. Comparison(s): No prior Echocardiogram. FINDINGS  Left Ventricle: Left ventricular ejection fraction, by estimation, is 40 to 45%. The left ventricle has mildly decreased function. Left ventricular endocardial border not optimally  defined to evaluate regional wall motion. Definity contrast agent was given IV to delineate the left ventricular endocardial borders. The left ventricular internal cavity size was normal in size. There is mild left ventricular hypertrophy. Indeterminate diastolic filling due to E-A fusion. Right Ventricle: The right ventricular size is moderately enlarged. Right vetricular wall thickness was not well visualized. Right ventricular systolic function is mildly reduced. Tricuspid regurgitation signal is inadequate for assessing PA pressure. Left Atrium: Left atrial size was normal in size. Right Atrium: Right atrial size was normal in size. Pericardium: There is no evidence of pericardial effusion. Mitral Valve: The mitral valve was not well visualized. No evidence of mitral valve regurgitation. No evidence of mitral valve stenosis. Tricuspid Valve: The tricuspid valve is not well visualized. Tricuspid valve regurgitation is not demonstrated. No evidence of tricuspid stenosis. Aortic Valve: The aortic valve is tricuspid. Aortic valve regurgitation is not visualized. No aortic stenosis is present. Aortic valve mean gradient measures 6.0 mmHg. Aortic valve peak gradient measures 10.1 mmHg. Aortic valve area, by VTI measures 2.14  cm. Pulmonic Valve: The pulmonic valve was not well visualized. Pulmonic valve regurgitation is not visualized. No evidence of pulmonic stenosis. Aorta: The aortic root and ascending aorta are structurally normal, with no evidence of dilitation. Venous: The inferior vena cava was not well visualized. IAS/Shunts: No atrial level shunt detected by color flow Doppler.  LEFT VENTRICLE PLAX 2D LVIDd:         4.30 cm   Diastology LVIDs:         3.60 cm   LV e' medial:    10.60 cm/s LV PW:         1.30 cm   LV E/e' medial:  12.9 LV IVS:        1.20 cm   LV e' lateral:   10.70 cm/s LVOT diam:     2.30 cm   LV E/e' lateral: 12.8 LV SV:         61 LV SV Index:   29 LVOT Area:     4.15 cm  LEFT ATRIUM            Index LA Vol (A4C): 50.9 ml 24.07 ml/m  AORTIC VALVE AV Area (Vmax):    2.10 cm AV Area (Vmean):   2.14 cm AV Area (VTI):     2.14 cm AV Vmax:           159.00 cm/s AV Vmean:          113.000 cm/s AV VTI:            0.287 m AV Peak Grad:      10.1 mmHg AV Mean Grad:      6.0 mmHg LVOT Vmax:         80.20 cm/s LVOT Vmean:        58.300 cm/s LVOT VTI:  0.148 m LVOT/AV VTI ratio: 0.52  AORTA Ao Asc diam: 3.20 cm MITRAL VALVE MV Area (PHT): 5.84 cm     SHUNTS MV Decel Time: 130 msec     Systemic VTI:  0.15 m MV E velocity: 137.00 cm/s  Systemic Diam: 2.30 cm Vishnu Priya Mallipeddi Electronically signed by Winfield Rast Mallipeddi Signature Date/Time: 09/20/2022/2:33:04 PM    Final         Scheduled Meds:  diltiazem  360 mg Oral Daily   docusate sodium  100 mg Oral BID   furosemide  40 mg Intravenous BID   irbesartan  150 mg Oral QHS   levothyroxine  100 mcg Oral Q0600   LORazepam  1.5 mg Oral QHS   metoprolol tartrate  50 mg Oral BID   warfarin  2.5 mg Oral ONCE-1600   Warfarin - Pharmacist Dosing Inpatient   Does not apply q1600   Continuous Infusions:  ceFEPime (MAXIPIME) IV 2 g (09/22/22 1610)   linezolid (ZYVOX) IV 600 mg (09/22/22 0845)     LOS: 4 days    Time spent: 52 minutes spent on chart review, discussion with nursing staff, consultants, updating family and interview/physical exam; more than 50% of that time was spent in counseling and/or coordination of care.    Alvira Philips [place], DO [facility] Available via Epic secure chat 7am-7pm After these hours, please refer to coverage provider listed on [email or web address] 09/22/2022, 12:21 PM

## 2022-09-22 NOTE — Progress Notes (Signed)
ANTICOAGULATION CONSULT NOTE - Follow Up Consult  Pharmacy Consult for warfarin Indication: hx atrial fibrillation, pulmonary embolus, and DVT  Allergies  Allergen Reactions   Codeine Other (See Comments)    Caused Hyperactivity and Dysphoria   Erythromycin Itching, Anxiety and Other (See Comments)    Also with jitters   Tetracyclines & Related Itching    Also with jitters   Statins Other (See Comments)    Leg muscle weakness   Atenolol Other (See Comments)    Depression    Citalopram Hydrobromide Nausea Only and Other (See Comments)    Nausea/Fatigue   Clindamycin/Lincomycin Other (See Comments)    Via IV, has caused a metallic taste in the mouth    Coenzyme Q10 Itching   Losartan Potassium Other (See Comments)    Muscle aches    Motrin [Ibuprofen] Other (See Comments)    Is to not take this   Rosuvastatin Other (See Comments)    Myalgias   Simvastatin Other (See Comments)    Myalgias   Tape     PAPER TAPE IS PPEFERRED   Latex Rash    Patient Measurements: Height: 5\' 4"  (162.6 cm) Weight: 110.7 kg (244 lb 0.8 oz) IBW/kg (Calculated) : 54.7 Heparin Dosing Weight:   Vital Signs: Temp: 97.7 F (36.5 C) (04/15 0544) Temp Source: Oral (04/15 0544) BP: 143/91 (04/15 0544) Pulse Rate: 97 (04/15 0544)  Labs: Recent Labs    09/20/22 0529 09/21/22 0436 09/22/22 0427  HGB 12.3 12.5 12.5  HCT 37.7 39.1 39.0  PLT 164 147* 177  LABPROT  --  26.0* 27.7*  INR  --  2.4* 2.6*  CREATININE 0.55 0.69 0.67    Estimated Creatinine Clearance: 63.7 mL/min (by C-G formula based on SCr of 0.67 mg/dL).   Medications:  - PTA warfarin regimen: 5 mg daily   Assessment: Patient's an 82 y.o F with hx afib, PE/DVT on warfarin PTA, who presented to the ED on 09/18/22 with c/o right leg swelling and redness. Warfarin resumed on admission and she's currently on abx for cellulitis.  Today, 09/22/2022:  - INR is therapeutic at 2.6 but is trending up - cbc stable - no bleeding  documented - drug-drug intxns: being on abx (linezolid and cefepime) can make pt more sensitive to warfarin  Goal of Therapy:  INR 2-3 Monitor platelets by anticoagulation protocol: Yes   Plan:  - Reduce warfarin dose to 2.5 mg PO x1 today - daily INR - monitor for s/sx bleeding  [name] P 09/22/2022,9:33 AM

## 2022-09-23 ENCOUNTER — Ambulatory Visit (HOSPITAL_COMMUNITY): Payer: Medicare HMO

## 2022-09-23 DIAGNOSIS — A419 Sepsis, unspecified organism: Secondary | ICD-10-CM | POA: Diagnosis not present

## 2022-09-23 DIAGNOSIS — L0390 Cellulitis, unspecified: Secondary | ICD-10-CM | POA: Diagnosis not present

## 2022-09-23 LAB — PROTIME-INR
INR: 2.4 — ABNORMAL HIGH (ref 0.8–1.2)
Prothrombin Time: 25.9 s — ABNORMAL HIGH (ref 11.4–15.2)

## 2022-09-23 LAB — CULTURE, BLOOD (ROUTINE X 2): Culture: NO GROWTH

## 2022-09-23 LAB — CBC
HCT: 40.1 % (ref 36.0–46.0)
Hemoglobin: 12.8 g/dL (ref 12.0–15.0)
MCH: 29.8 pg (ref 26.0–34.0)
MCHC: 31.9 g/dL (ref 30.0–36.0)
MCV: 93.3 fL (ref 80.0–100.0)
Platelets: 236 10*3/uL (ref 150–400)
RBC: 4.3 MIL/uL (ref 3.87–5.11)
RDW: 15.6 % — ABNORMAL HIGH (ref 11.5–15.5)
WBC: 8.8 10*3/uL (ref 4.0–10.5)
nRBC: 0 % (ref 0.0–0.2)

## 2022-09-23 LAB — BASIC METABOLIC PANEL WITH GFR
Anion gap: 9 (ref 5–15)
BUN: 13 mg/dL (ref 8–23)
CO2: 29 mmol/L (ref 22–32)
Calcium: 8.4 mg/dL — ABNORMAL LOW (ref 8.9–10.3)
Chloride: 96 mmol/L — ABNORMAL LOW (ref 98–111)
Creatinine, Ser: 0.67 mg/dL (ref 0.44–1.00)
GFR, Estimated: >60 mL/min
Glucose, Bld: 121 mg/dL — ABNORMAL HIGH (ref 70–99)
Potassium: 3.5 mmol/L (ref 3.5–5.1)
Sodium: 134 mmol/L — ABNORMAL LOW (ref 135–145)

## 2022-09-23 LAB — MAGNESIUM: Magnesium: 2 mg/dL (ref 1.7–2.4)

## 2022-09-23 LAB — PROCALCITONIN: Procalcitonin: <0.1 ng/mL

## 2022-09-23 LAB — BRAIN NATRIURETIC PEPTIDE: B Natriuretic Peptide: 162.3 pg/mL — ABNORMAL HIGH (ref 0.0–100.0)

## 2022-09-23 MED ORDER — CIPROFLOXACIN HCL 500 MG PO TABS
500.0000 mg | ORAL_TABLET | Freq: Two times a day (BID) | ORAL | Status: DC
  Administered 2022-09-23 – 2022-09-26 (×7): 500 mg via ORAL
  Filled 2022-09-23 (×7): qty 1

## 2022-09-23 MED ORDER — POTASSIUM CHLORIDE CRYS ER 20 MEQ PO TBCR
40.0000 meq | EXTENDED_RELEASE_TABLET | Freq: Once | ORAL | Status: AC
  Administered 2022-09-23: 40 meq via ORAL
  Filled 2022-09-23: qty 2

## 2022-09-23 MED ORDER — WARFARIN SODIUM 5 MG PO TABS
5.0000 mg | ORAL_TABLET | Freq: Once | ORAL | Status: AC
  Administered 2022-09-23: 5 mg via ORAL
  Filled 2022-09-23: qty 1

## 2022-09-23 MED ORDER — METOPROLOL TARTRATE 50 MG PO TABS
75.0000 mg | ORAL_TABLET | Freq: Two times a day (BID) | ORAL | Status: DC
  Administered 2022-09-23 (×2): 75 mg via ORAL
  Filled 2022-09-23 (×3): qty 1

## 2022-09-23 MED ORDER — LINEZOLID 600 MG PO TABS
600.0000 mg | ORAL_TABLET | Freq: Two times a day (BID) | ORAL | Status: AC
  Administered 2022-09-23 – 2022-09-24 (×3): 600 mg via ORAL
  Filled 2022-09-23 (×5): qty 1

## 2022-09-23 NOTE — Progress Notes (Addendum)
Physical Therapy Treatment Patient Details Name: [name] MRN: [id number] DOB: 1941-04-05 Today's Date: 09/23/2022   History of Present Illness 82 y.o. female with past medical history significant for DVT/PE on Coumadin, HTN, hypothyroidism, OSA on nocturnal CPAP, obesity, recent right lower extremity cellulitis treated with outpatient clindamycin and pt admitted 09/18/22 for recurrent right LE cellulitis and new diagnosis of systolic congestive heart failure    PT Comments    Pt is progressing well with mobility, she ambulated 450' with RW, SpO2 85% on room air walking, 92% on 3L O2 walking with verbal cues for pursed lip breathing, 1/4 dyspnea, no loss of balance.       Recommendations for follow up therapy are one component of a multi-disciplinary discharge planning process, led by the attending physician.  Recommendations may be updated based on patient status, additional functional criteria and insurance authorization.  Follow Up Recommendations       Assistance Recommended at Discharge PRN  Patient can return home with the following Assistance with cooking/housework;Assist for transportation;Help with stairs or ramp for entrance   Equipment Recommendations  None recommended by PT    Recommendations for Other Services       Precautions / Restrictions Precautions Precautions: Fall Precaution Comments: monitor O2 Restrictions Weight Bearing Restrictions: No     Mobility  Bed Mobility               General bed mobility comments: up at edge of bed    Transfers Overall transfer level: Modified independent                 General transfer comment: SPT bed to bedside commode    Ambulation/Gait Ambulation/Gait assistance: Supervision Gait Distance (Feet): 450 Feet Assistive device: Rolling walker (2 wheels) Gait Pattern/deviations: Step-through pattern, Decreased stride length       General Gait Details: steady, no loss of balance, SpO2  85% on room air walking, 92% on 3L O2 walking, VCs for pursed lip breathing. 1/4 dyspnea   Stairs             Wheelchair Mobility    Modified Rankin (Stroke Patients Only)       Balance Overall balance assessment: Needs assistance   Sitting balance-Leahy Scale: Good     Standing balance support: No upper extremity supported Standing balance-Leahy Scale: Fair                              Cognition Arousal/Alertness: Awake/alert Behavior During Therapy: WFL for tasks assessed/performed Overall Cognitive Status: Within Functional Limits for tasks assessed                                          Exercises      General Comments        Pertinent Vitals/Pain Pain Assessment Pain Score: 0-No pain Faces Pain Scale: No hurt    Home Living                          Prior Function            PT Goals (current goals can now be found in the care plan section) Acute Rehab PT Goals PT Goal Formulation: With patient Time For Goal Achievement: 10/05/22 Potential to Achieve Goals: Good Progress towards PT goals: Progressing toward goals  Frequency    Min 1X/week      PT Plan Current plan remains appropriate    Co-evaluation              AM-PAC PT "6 Clicks" Mobility   Outcome Measure  Help needed turning from your back to your side while in a flat bed without using bedrails?: None Help needed moving from lying on your back to sitting on the side of a flat bed without using bedrails?: None Help needed moving to and from a bed to a chair (including a wheelchair)?: None Help needed standing up from a chair using your arms (e.g., wheelchair or bedside chair)?: None Help needed to walk in hospital room?: A Little Help needed climbing 3-5 steps with a railing? : A Little 6 Click Score: 22    End of Session Equipment Utilized During Treatment: Oxygen Activity Tolerance: Patient tolerated treatment well Patient  left: in bed;with call bell/phone within reach;with family/visitor present Nurse Communication: Mobility status PT Visit Diagnosis: Difficulty in walking, not elsewhere classified (R26.2)     Time: 1610-9604 PT Time Calculation (min) (ACUTE ONLY): 38 min  Charges:  $Gait Training: 23-37 mins $Therapeutic Activity: 8-22 mins                     Ralene Bathe Kistler PT 09/23/2022  Acute Rehabilitation Services  Office [phone]

## 2022-09-23 NOTE — Progress Notes (Addendum)
PROGRESS NOTE    [name]  ZOX:[id number] DOB: 1941-05-11 DOA: 09/18/2022 PCP: Garlan Fillers, MD    Brief Narrative:   [name] is a 82 y.o. female with past medical history significant for DVT/PE on Coumadin, HTN, hypothyroidism, OSA on nocturnal CPAP, obesity, recent right lower extremity cellulitis treated with outpatient clindamycin who presented to [facility] ED on 4/11 complaining of right lower extremity swelling, redness that has been progressing over the last 4 days.  Reports fever of 103.4; 2 days prior to admission.  Patient reports that she completed total course of clindamycin that was prescribed by urgent care; completed roughly 3 weeks ago.  Does endorse chronic left lower extremity edema.  Originally represented to urgent care who directed her to the ED for further evaluation.  In the ED, temperature 98.5 F, HR 112, RR 18, BP 150/80, SpO2 94% on room air.  WBC 13.3, hemoglobin 13.8, platelets 147.  Sodium 136, potassium 3.4, chloride 88, CO2 29, glucose 133, BUN 25, creatinine 0.89.  AST 26, ALT 34, total bili 0.5.  Lactic acid 1.3.  INR 2.2.  Superficial wound culture done by EDP with no growth/WBCs noted on Gram stain.  Vascular duplex ultrasound right lower extremity negative for DVT.  Patient was started on IV cefepime and vancomycin.  TRH consulted for admission for further evaluation and management of recurrent right lower extremity cellulitis.  Assessment & Plan:   Right lower extremity cellulitis, recurrent Patient presenting with recurrent redness, erythema and discomfort to right lower extremity.  Recently treated with 10-day course of clindamycin outpatient by urgent care; completed 3 weeks ago.  Patient is afebrile with elevated WBC count of 13.3 on admission.  Vascular duplex ultrasound right lower extremity negative for DVT.  Lactic acid within normal limits. -- WBC 13.3>7.0>11.1>9.3>9.4>8.8 -- CRP 20.1>15.2 -- ESR 66>74 --  Procalcitonin 0.73>0.33>0.22, <0.10 -- Superficial wound culture: + Pseudomonas -- Blood cultures x 2 4/11: No growth x 5 days -- Blood cultures 4/13: No growth x 3 days -- Zyvox 600 mg PO q12 hour; plan 7 day course -- Transition cefepime to ciprofloxacin today, plan 7-day course -- Tylenol as needed -- Oxycodone 5 mg p.o. every 4 hours as needed moderate pain -- CBC daily  Systolic congestive heart failure, new diagnosis TTE 09/20/2022 with LVEF 40-50 percent, LV mildly decreased function, although poor windows and given tachycardia with recommendation of obtaining limited echo with contrast when heart rate better controlled. -- Lasix 40mg  IV q12h -- continue ARB, BB -- strict I/O, daily weights -- Outpatient follow-up with cardiology  Hypokalemia Potassium 4.0 this morning -- Repeat electrolytes in a.m. to include magnesium  Persistent atrial fibrillation with RVR Essential hypertension -- Diltiazem 360 mg p.o. daily -- started on Metoprolol tartrate; increased to 75 mg PO BID -- Irbesartan 150 mg p.o. daily -- Discontinued home HCTZ -- Continue Coumadin, pharmacy managing -- Lasix 40 mg IV q12h -- Monitor on telemetry  Hx DVT/PE on anticoagulation -- Goal INR 2-3, INR 2.4 this morning, at goal -- Pharmacy consulted for continued dosing/monitoring Coumadin -- INR daily  Hypothyroidism -- Levothyroxine 100 mcg p.o. daily  OSA  -- Continue nocturnal CPAP  Morbid obesity Discussed with patient needs for aggressive lifestyle changes/weight loss as this complicates all facets of care.  Outpatient follow-up with PCP.       DVT prophylaxis: SCDs Start: 09/18/22 1701    Code Status: Full Code Family Communication: Updated spouse present at bedside this morning  Disposition Plan:  Level of care: Progressive Status is: Inpatient Remains inpatient appropriate because: IV antibiotics, IV diuresis, anticipate discharge home in 1-2 days    Consultants:   None  Procedures:  Vascular duplex ultrasound right lower extremity  Antimicrobials:  Clindamycin 4/11 - 4/12 Cefazolin 4/12 - 4/13 Zyvox 4/13>> Cefepime 4/13>>   Subjective: Patient seen examined bedside, resting comfortably.  Lying in bed.  Breathing continues to improve, now titrated down to 1 L per nasal cannula.  Husband present at bedside.  Heart rate remains better controlled after initiation and up titration of metoprolol.  Remains afebrile now switched to Zyvox/cefepime.  Superficial wound culture showing Pseudomonas.  No other specific questions or concerns at this time. Denies headache, no dizziness, no chest pain, no palpitations, no shortness of breath, no abdominal pain, no fever/chills/night sweats, no nausea/vomiting/diarrhea, no focal weakness, no fatigue, no paresthesias.  No acute events overnight per nursing staff.  Objective: Vitals:   09/22/22 2042 09/22/22 2050 09/23/22 0615 09/23/22 1000  BP: (!) 147/78 139/83 135/85   Pulse: 98  95   Resp: 19  20   Temp: 98.1 F (36.7 C)  98 F (36.7 C)   TempSrc: Oral  Oral   SpO2: 96%  (!) 87% 93%  Weight:      Height:        Intake/Output Summary (Last 24 hours) at 09/23/2022 1056 Last data filed at 09/23/2022 0700 Gross per 24 hour  Intake 1131.08 ml  Output 3450 ml  Net -2318.92 ml   Filed Weights   09/18/22 2100 09/22/22 0655  Weight: 108.9 kg 110.7 kg    Examination:  Physical Exam: GEN: NAD, alert and oriented x 3, obese HEENT: NCAT, PERRL, EOMI, sclera clear, MMM, aphthous ulcers noted hard palate PULM: CTAB w/o wheezes/crackles, normal respiratory effort, on 1 L nasal cannula with SpO2 96% at rest CV: RRR w/o M/G/R GI: abd soft, NTND, NABS, no R/G/M MSK: Noted bilateral lower extremity peripheral edema/lymphedema, right lower extremity with erythema and mild TTP, moves all extremities independently with preserved muscle strength NEURO: No focal neurological deficits PSYCH: normal  mood/affect Integumentary: Right lower extremity with erythema, edema and mild tenderness to palpation surrounding the foot ankle and lower leg that is improving, left lower extremity with noted lymphedema that is stable per patient with chronic skin changes, otherwise no other concerning rashes/lesions/wounds noted on exposed skin surfaces    Data Reviewed: I have personally reviewed following labs and imaging studies  CBC: Recent Labs  Lab 09/18/22 1519 09/19/22 0630 09/20/22 0529 09/21/22 0436 09/22/22 0427 09/23/22 0407  WBC 13.3* 7.0 11.1* 9.3 9.4 8.8  NEUTROABS 10.8*  --   --   --   --   --   HGB 13.8 12.4 12.3 12.5 12.5 12.8  HCT 42.3 39.3 37.7 39.1 39.0 40.1  MCV 93.8 95.2 93.8 94.0 94.2 93.3  PLT 147* 143* 164 147* 177 236   Basic Metabolic Panel: Recent Labs  Lab 09/19/22 0630 09/20/22 0529 09/21/22 0436 09/22/22 0427 09/23/22 0407  NA 137 134* 136 132* 134*  K 3.0* 3.4* 3.4* 4.0 3.5  CL 104 103 99 96* 96*  CO2 27 24 28 29 29   GLUCOSE 102* 128* 130* 127* 121*  BUN 17 11 10 11 13   CREATININE 0.66 0.55 0.69 0.67 0.67  CALCIUM 7.8* 7.6* 8.2* 8.3* 8.4*  MG  --  1.8 2.5* 2.5* 2.0   GFR: Estimated Creatinine Clearance: 63.7 mL/min (by C-G formula based on SCr of  0.67 mg/dL). Liver Function Tests: Recent Labs  Lab 09/18/22 1519  AST 26  ALT 34  ALKPHOS 64  BILITOT 0.5  PROT 7.3  ALBUMIN 2.9*   No results for input(s): "LIPASE", "AMYLASE" in the last 168 hours. No results for input(s): "AMMONIA" in the last 168 hours. Coagulation Profile: Recent Labs  Lab 09/18/22 1519 09/19/22 0630 09/21/22 0436 09/22/22 0427 09/23/22 0940  INR 2.0* 2.2* 2.4* 2.6* 2.4*   Cardiac Enzymes: No results for input(s): "CKTOTAL", "CKMB", "CKMBINDEX", "TROPONINI" in the last 168 hours. BNP (last 3 results) No results for input(s): "PROBNP" in the last 8760 hours. HbA1C: No results for input(s): "HGBA1C" in the last 72 hours. CBG: No results for input(s): "GLUCAP" in  the last 168 hours. Lipid Profile: No results for input(s): "CHOL", "HDL", "LDLCALC", "TRIG", "CHOLHDL", "LDLDIRECT" in the last 72 hours. Thyroid Function Tests: Recent Labs    09/21/22 0436  TSH 2.282   Anemia Panel: No results for input(s): "VITAMINB12", "FOLATE", "FERRITIN", "TIBC", "IRON", "RETICCTPCT" in the last 72 hours. Sepsis Labs: Recent Labs  Lab 09/18/22 1519 09/19/22 0720 09/20/22 0529 09/21/22 0436 09/23/22 0407  PROCALCITON  --  0.73 0.33 0.22 <0.10  LATICACIDVEN 1.3  --   --   --   --     Recent Results (from the past 240 hour(s))  Culture, blood (routine x 2)     Status: None   Collection Time: 09/18/22  3:00 PM   Specimen: BLOOD LEFT HAND  Result Value Ref Range Status   Specimen Description   Final    BLOOD LEFT HAND Performed at [facility], [address], [place], [place] [address]    Special Requests   Final    BOTTLES DRAWN AEROBIC AND ANAEROBIC Blood Culture results may not be optimal due to an inadequate volume of blood received in culture bottles Performed at [facility], [address], [place], [place] [address]    Culture   Final    NO GROWTH 5 DAYS Performed at [facility], [address] [place], [place] [address]    Report Status 09/23/2022 FINAL  Final  Aerobic Culture w Gram Stain (superficial specimen)     Status: None   Collection Time: 09/18/22  4:00 PM   Specimen: Wound  Result Value Ref Range Status   Specimen Description   Final    WOUND SKIN, OTHER Performed at [facility], [address], [place], [place] [address]    Special Requests   Final    NONE Performed at [facility], [address] [place], [place] [address]    Gram Stain   Final    NO WBC SEEN NO ORGANISMS SEEN Performed at [facility], [address], [place], [place] [address]    Culture RARE PSEUDOMONAS AERUGINOSA  Final   Report Status 09/21/2022 FINAL   Final   Organism ID, Bacteria PSEUDOMONAS AERUGINOSA  Final      Susceptibility   Pseudomonas aeruginosa - MIC*    CEFTAZIDIME 2 SENSITIVE Sensitive     CIPROFLOXACIN <=0.25 SENSITIVE Sensitive     GENTAMICIN <=1 SENSITIVE Sensitive     IMIPENEM 2 SENSITIVE Sensitive     PIP/TAZO 8 SENSITIVE Sensitive     CEFEPIME 2 SENSITIVE Sensitive     * RARE PSEUDOMONAS AERUGINOSA  Culture, blood (Routine X 2) w Reflex to ID Panel     Status: None (Preliminary result)   Collection Time: 09/20/22  6:09 PM   Specimen: BLOOD RIGHT ARM  Result Value Ref Range Status   Specimen Description BLOOD RIGHT ARM  Final   Special Requests   Final    BOTTLES DRAWN AEROBIC AND ANAEROBIC Blood Culture adequate volume   Culture   Final    NO GROWTH 3 DAYS Performed at [facility], [address], [place], [place] [address]    Report Status PENDING  Incomplete  Culture, blood (Routine X 2) w Reflex to ID Panel     Status: None (Preliminary result)   Collection Time: 09/20/22  6:09 PM   Specimen: BLOOD LEFT HAND  Result Value Ref Range Status   Specimen Description BLOOD LEFT HAND  Final   Special Requests   Final    BOTTLES DRAWN AEROBIC AND ANAEROBIC Blood Culture adequate volume   Culture   Final    NO GROWTH 3 DAYS Performed at [facility], [address] [place], [place] [address]    Report Status PENDING  Incomplete         Radiology Studies: No results found.      Scheduled Meds:  diltiazem  360 mg Oral Daily   docusate sodium  100 mg Oral BID   furosemide  40 mg Intravenous BID   irbesartan  150 mg Oral QHS   levothyroxine  100 mcg Oral Q0600   LORazepam  1.5 mg Oral QHS   metoprolol tartrate  75 mg Oral BID   Warfarin - Pharmacist Dosing Inpatient   Does not apply q1600   Continuous Infusions:  ceFEPime (MAXIPIME) IV 2 g (09/23/22 0512)   linezolid (ZYVOX) IV 600 mg (09/23/22 1012)     LOS: 5 days    Time spent: 52 minutes spent on chart review, discussion  with nursing staff, consultants, updating family and interview/physical exam; more than 50% of that time was spent in counseling and/or coordination of care.    Alvira Philips [place], DO [facility] Available via Epic secure chat 7am-7pm After these hours, please refer to coverage provider listed on [email or web address] 09/23/2022, 10:56 AM

## 2022-09-23 NOTE — Progress Notes (Signed)
ANTICOAGULATION CONSULT NOTE - Follow Up Consult  Pharmacy Consult for warfarin Indication: hx atrial fibrillation, pulmonary embolus, and DVT  Allergies  Allergen Reactions   Codeine Other (See Comments)    Caused Hyperactivity and Dysphoria   Erythromycin Itching, Anxiety and Other (See Comments)    Also with jitters   Tetracyclines & Related Itching    Also with jitters   Statins Other (See Comments)    Leg muscle weakness   Atenolol Other (See Comments)    Depression    Citalopram Hydrobromide Nausea Only and Other (See Comments)    Nausea/Fatigue   Clindamycin/Lincomycin Other (See Comments)    Via IV, has caused a metallic taste in the mouth    Coenzyme Q10 Itching   Losartan Potassium Other (See Comments)    Muscle aches    Motrin [Ibuprofen] Other (See Comments)    Is to not take this   Rosuvastatin Other (See Comments)    Myalgias   Simvastatin Other (See Comments)    Myalgias   Tape     PAPER TAPE IS PPEFERRED   Latex Rash    Patient Measurements: Height:  (162.6 cm) Weight: 110.7 kg (244 lb 0.8 oz) IBW/kg (Calculated) : 54.7 Heparin Dosing Weight:   Vital Signs: Temp: 98 F (36.7 C) (04/16 0615) Temp Source: Oral (04/16 0615) BP: 135/85 (04/16 0615) Pulse Rate: 95 (04/16 0615)  Labs: Recent Labs    09/21/22 0436 09/22/22 0427 09/23/22 0407 09/23/22 0940  HGB 12.5 12.5 12.8  --   HCT 39.1 39.0 40.1  --   PLT 147* 177 236  --   LABPROT 26.0* 27.7*  --  25.9*  INR 2.4* 2.6*  --  2.4*  CREATININE 0.69 0.67 0.67  --      Estimated Creatinine Clearance: 63.7 mL/min (by C-G formula based on SCr of 0.67 mg/dL).   Medications:  - PTA warfarin regimen: 5 mg daily   Assessment: Patient's an 82 y.o F with hx afib, PE/DVT on warfarin PTA, who presented to the ED on 09/18/22 with c/o right leg swelling and redness. Warfarin resumed on admission and she's currently on abx for cellulitis.  Today, 09/23/2022:  - INR is therapeutic at 2.4 today  , stable   - cbc stable - no bleeding documented - drug-drug intxns: being on abx (linezolid and cefepime) can make pt more sensitive to warfarin  Goal of Therapy:  INR 2-3 Monitor platelets by anticoagulation protocol: Yes   Plan:  - Warfarin 5 mg PO x1 today - daily INR - monitor for s/sx bleeding  Adalberto Cole, PharmD, BCPS 09/23/2022 11:30 AM

## 2022-09-23 NOTE — Progress Notes (Signed)
SATURATION QUALIFICATIONS: (This note is used to comply with regulatory documentation for home oxygen)  Patient Saturations on Room Air at Rest = 92%  Patient Saturations on Room Air while Ambulating = 85%  Patient Saturations on 3 Liters of oxygen while Ambulating = 92%  Please briefly explain why patient needs home oxygen: to maintain appropriate SpO2 levels.   Ralene Bathe Kistler PT 09/23/2022  Acute Rehabilitation Services  Office [phone]

## 2022-09-24 ENCOUNTER — Inpatient Hospital Stay (HOSPITAL_COMMUNITY): Payer: Medicare HMO

## 2022-09-24 DIAGNOSIS — I4891 Unspecified atrial fibrillation: Secondary | ICD-10-CM

## 2022-09-24 DIAGNOSIS — G4733 Obstructive sleep apnea (adult) (pediatric): Secondary | ICD-10-CM

## 2022-09-24 DIAGNOSIS — L03115 Cellulitis of right lower limb: Secondary | ICD-10-CM

## 2022-09-24 DIAGNOSIS — I429 Cardiomyopathy, unspecified: Secondary | ICD-10-CM

## 2022-09-24 DIAGNOSIS — I4819 Other persistent atrial fibrillation: Secondary | ICD-10-CM

## 2022-09-24 DIAGNOSIS — I420 Dilated cardiomyopathy: Secondary | ICD-10-CM

## 2022-09-24 DIAGNOSIS — I5082 Biventricular heart failure: Secondary | ICD-10-CM

## 2022-09-24 DIAGNOSIS — L0390 Cellulitis, unspecified: Secondary | ICD-10-CM | POA: Diagnosis not present

## 2022-09-24 DIAGNOSIS — I5021 Acute systolic (congestive) heart failure: Secondary | ICD-10-CM

## 2022-09-24 DIAGNOSIS — I10 Essential (primary) hypertension: Secondary | ICD-10-CM

## 2022-09-24 LAB — CBC
HCT: 40.6 % (ref 36.0–46.0)
Hemoglobin: 13 g/dL (ref 12.0–15.0)
MCH: 30 pg (ref 26.0–34.0)
MCHC: 32 g/dL (ref 30.0–36.0)
MCV: 93.8 fL (ref 80.0–100.0)
Platelets: 274 10*3/uL (ref 150–400)
RBC: 4.33 MIL/uL (ref 3.87–5.11)
RDW: 15.5 % (ref 11.5–15.5)
WBC: 8 10*3/uL (ref 4.0–10.5)
nRBC: 0 % (ref 0.0–0.2)

## 2022-09-24 LAB — PROTIME-INR
INR: 2.3 — ABNORMAL HIGH (ref 0.8–1.2)
Prothrombin Time: 25.1 seconds — ABNORMAL HIGH (ref 11.4–15.2)

## 2022-09-24 LAB — CULTURE, BLOOD (ROUTINE X 2): Special Requests: ADEQUATE

## 2022-09-24 LAB — ECHOCARDIOGRAM LIMITED
Height: 64 in
Weight: 3784.86 oz

## 2022-09-24 LAB — BASIC METABOLIC PANEL
Anion gap: 7 (ref 5–15)
BUN: 15 mg/dL (ref 8–23)
CO2: 30 mmol/L (ref 22–32)
Calcium: 8.4 mg/dL — ABNORMAL LOW (ref 8.9–10.3)
Chloride: 97 mmol/L — ABNORMAL LOW (ref 98–111)
Creatinine, Ser: 0.86 mg/dL (ref 0.44–1.00)
GFR, Estimated: >60 mL/min (ref >60)
Glucose, Bld: 148 mg/dL — ABNORMAL HIGH (ref 70–99)
Potassium: 3.4 mmol/L — ABNORMAL LOW (ref 3.5–5.1)
Sodium: 134 mmol/L — ABNORMAL LOW (ref 135–145)

## 2022-09-24 LAB — MAGNESIUM: Magnesium: 2 mg/dL (ref 1.7–2.4)

## 2022-09-24 LAB — C-REACTIVE PROTEIN: CRP: 9.9 mg/dL — ABNORMAL HIGH (ref <1.0)

## 2022-09-24 LAB — BRAIN NATRIURETIC PEPTIDE: B Natriuretic Peptide: 186.1 pg/mL — ABNORMAL HIGH (ref 0.0–100.0)

## 2022-09-24 MED ORDER — WARFARIN SODIUM 5 MG PO TABS
5.0000 mg | ORAL_TABLET | Freq: Once | ORAL | Status: AC
  Administered 2022-09-24: 5 mg via ORAL
  Filled 2022-09-24: qty 1

## 2022-09-24 MED ORDER — PERFLUTREN LIPID MICROSPHERE
1.0000 mL | INTRAVENOUS | Status: AC | PRN
  Administered 2022-09-24: 2 mL via INTRAVENOUS

## 2022-09-24 MED ORDER — SACUBITRIL-VALSARTAN 24-26 MG PO TABS
1.0000 | ORAL_TABLET | Freq: Two times a day (BID) | ORAL | Status: DC
  Administered 2022-09-24 – 2022-09-25 (×2): 1 via ORAL
  Filled 2022-09-24 (×2): qty 1

## 2022-09-24 MED ORDER — WARFARIN SODIUM 5 MG PO TABS: 5.0000 mg | ORAL_TABLET | Freq: Once | ORAL | Status: DC

## 2022-09-24 MED ORDER — NEBIVOLOL HCL 2.5 MG PO TABS
2.5000 mg | ORAL_TABLET | Freq: Every day | ORAL | Status: DC
  Administered 2022-09-24: 2.5 mg via ORAL
  Filled 2022-09-24: qty 1

## 2022-09-24 MED ORDER — ORAL CARE MOUTH RINSE: 15.0000 mL | OROMUCOSAL | Status: DC | PRN

## 2022-09-24 MED ORDER — NEBIVOLOL HCL 2.5 MG PO TABS
2.5000 mg | ORAL_TABLET | Freq: Once | ORAL | Status: AC
  Administered 2022-09-24: 2.5 mg via ORAL
  Filled 2022-09-24: qty 1

## 2022-09-24 MED ORDER — POTASSIUM CHLORIDE CRYS ER 20 MEQ PO TBCR
40.0000 meq | EXTENDED_RELEASE_TABLET | Freq: Two times a day (BID) | ORAL | Status: AC
  Administered 2022-09-24 (×2): 40 meq via ORAL
  Filled 2022-09-24 (×2): qty 2

## 2022-09-24 MED ORDER — NEBIVOLOL HCL 5 MG PO TABS
5.0000 mg | ORAL_TABLET | Freq: Every day | ORAL | Status: DC
  Filled 2022-09-24: qty 1

## 2022-09-24 NOTE — Progress Notes (Signed)
Verified with Dion Body, NP to give additional 2.5mg  dose of bystolic today.

## 2022-09-24 NOTE — TOC Progression Note (Signed)
Transition of Care [facility]) - Progression Note    Patient Details  Name: [name] MRN: [id number] Date of Birth: 1940/09/19  [facility] CM/SW Contact  Howell Rucks, RN Phone Number: 09/24/2022, 11:29 AM  Clinical Narrative:  Met with pt in room, remains undecided about HHPT, reports she may just contact her PCP once discharged. Will continue to follow         Expected Discharge Plan and Services       Living arrangements for the past 2 months: Single Family Home                                       Social Determinants of Health (SDOH) Interventions SDOH Screenings   Food Insecurity: No Food Insecurity (09/18/2022)  Housing: Low Risk  (09/18/2022)  Transportation Needs: No Transportation Needs (09/18/2022)  Utilities: Not At Risk (09/18/2022)  Tobacco Use: Low Risk  (09/18/2022)    Readmission Risk Interventions    09/19/2022    2:13 PM  Readmission Risk Prevention Plan  Post Dischage Appt Complete  Medication Screening Complete

## 2022-09-24 NOTE — Progress Notes (Signed)
ANTICOAGULATION CONSULT NOTE - Follow Up Consult  Pharmacy Consult for warfarin Indication: hx atrial fibrillation, pulmonary embolus, and DVT  Allergies  Allergen Reactions   Codeine Other (See Comments)    Caused Hyperactivity and Dysphoria   Erythromycin Itching, Anxiety and Other (See Comments)    Also with jitters   Tetracyclines & Related Itching    Also with jitters   Statins Other (See Comments)    Leg muscle weakness   Atenolol Other (See Comments)    Depression    Citalopram Hydrobromide Nausea Only and Other (See Comments)    Nausea/Fatigue   Clindamycin/Lincomycin Other (See Comments)    Via IV, has caused a metallic taste in the mouth    Coenzyme Q10 Itching   Losartan Potassium Other (See Comments)    Muscle aches    Motrin [Ibuprofen] Other (See Comments)    Is to not take this   Rosuvastatin Other (See Comments)    Myalgias   Simvastatin Other (See Comments)    Myalgias   Tape     PAPER TAPE IS PPEFERRED   Latex Rash    Patient Measurements: Height:  (162.6 cm) Weight: 107.3 kg (236 lb 8.9 oz) IBW/kg (Calculated) : 54.7  Vital Signs: Temp: 98 F (36.7 C) (04/17 0340) Temp Source: Oral (04/17 0340) BP: 144/79 (04/17 0340) Pulse Rate: 84 (04/17 0340)  Labs: Recent Labs    09/22/22 0427 09/23/22 0407 09/23/22 0940 09/24/22 0401  HGB 12.5 12.8  --  13.0  HCT 39.0 40.1  --  40.6  PLT 177 236  --  274  LABPROT 27.7*  --  25.9* 25.1*  INR 2.6*  --  2.4* 2.3*  CREATININE 0.67 0.67  --  0.86     Estimated Creatinine Clearance: 58.2 mL/min (by C-G formula based on SCr of 0.86 mg/dL).   Medications:  - PTA warfarin regimen: 5 mg daily   Assessment: Patient's an 82 y.o F with hx afib, PE/DVT on warfarin PTA, who presented to the ED on 09/18/22 with c/o right leg swelling and redness. Warfarin resumed on admission and she's currently on abx for cellulitis.  Today, 09/24/2022:  - INR = 2.3, remains therapeutic   - CBC WNL - no bleeding  documented - drug-drug intxns: being on abx (linezolid and cipro) can make pt more sensitive to warfarin  Goal of Therapy:  INR 2-3 Monitor platelets by anticoagulation protocol: Yes   Plan:  - Warfarin 5 mg PO x1 today - daily INR - Monitor for s/sx bleeding  Greer Pickerel, PharmD, BCPS 09/24/2022 3:38 PM

## 2022-09-24 NOTE — Consult Note (Signed)
Cardiology Consultation   Patient ID: [name] MRN: [id number]; DOB: [birth year]  Admit date: 09/18/2022 Date of Consult: 09/24/2022  PCP:  Garlan Fillers, MD   [facility] Providers Cardiologist:  Lance Muss, MD   {    Patient Profile:   [name] is a 82 y.o. female with a hx of permanent A fib, hx of PE/DVT, OSA on CPAP, HLD, obesity, HTN, anxiety, chronic leg edema,  who is being seen 09/24/2022 for the evaluation of CHF and A fib RVR at the request of Dr Marland Mcalpine.  History of Present Illness:   [name] with above PMH presented to ER 09/18/22 for recurrent worsening RLE erythema, tenderness, swelling and fever. She had outpatient antibiotic for RLE cellulitis few weeks ago. She was admitted for RLE cellulitis and given IV antibiotic. Due to complaint of dyspnea, Echo was done 09/20/22 showed LVEF 40-50%, difficult assess RWMA, mild LVH, indeterminate diastolic filling, mildly reduced RV, RV moderately enlarged. She was felt in acute systolic heart failure, started on IV Lasix  daily. Course is also complicated by A fib RVR, continued on PTA cardizem  daily, added metoprolol  BID, rate control is adequate but patient does not wish to take metoprolol. Cardiology is consulted today for further input.   She is not a good historian, bring up off topic, has significant anxiety. She follows Dr Eldridge Dace historically, has permanent A fib, rate controlled on Cardizem  and takes anticoagulation coumadin. She states she had taken metoprolol in the past and can't tolerate it due to sleep disturbance as well as depression. She has chronic BLE edema for at least >40 years. She normally uses compression hoses for leg edema. She does not take any loop diuretic at baseline, states she was prescribed with it at one point in 2022 by PCP but never received the medication. She takes HCTZ for HTN. She was last seen 08/26/22 by Mr Alden Server NP-C,  complained chest heaviness with increased anxiety and chronic BLE 2+ edema. Reportedly had negative ischemic evaluation with stress test in 2009. Her symptoms were felt due to anxiety and leg edema was felt chronic with recent stasis eczema.   She states she had seen dermatology over the years for leg swelling, has been getting more swelling before this admission with redness up to her thigh and tenderness on touch. She denied any exertion or resting SOB. She states she is active at home, does all ADLs independently and also cooks and clean. She denied any restriction of her activity. She denied orthopnea, PND, does has hx of OSA uses CPAP. She denied having any chest pain, dizziness, syncope, heart palpitations.   Admission workup notable for leukocytosis, elevated procal 0.77, hypokalemia, elevated CRP and ESR.  CXR showed Mild patchy bilateral lower lobe opacities, raising concern for infection/pneumonia, less likely atelectasis. BNP 279 on 09/21/22. Labs today showed hypokalemia 3.4, BNP 186, resolved leukocytosis. Blood culture NTD. Doppler of RLE 09/18/22 no DVT. Echo on 09/20/22 as above.     Past Medical History:  Diagnosis Date   Anxiety    takes Lorazepam daily as needed   Arthritis    Cancer    thyroid   Cataracts, bilateral    immature   Chronic back pain    compressed vertebrae,takes Fosamax weekly   Family history of adverse reaction to anesthesia    granddaughter gets very sick and mean   History of blood transfusion    no abnormal reaction noted   History  of bronchitis 05/2015   History of colon polyps    benign   Hyperlipidemia    takes Zetia daily   Hypertension    takes Diltiazem and Avapro daily   Hypothyroidism    takes Synthroid daily   Ischemic colitis    hx of-many yrs ago   Joint pain    Joint swelling    OSA on CPAP    05-08-12 AHi was 46, RDI  53. titrated to   9 cm water  with 3 cm flex.-nadir 75%   PE (pulmonary embolism)    takes Coumadin daily    Peripheral edema    Pneumonia 1992   hx of   Sarcoidosis    Shortness of breath    Urinary frequency    Weakness    left leg.Numbness and tingling.Has sciatica   Wheeze    occaionally. Not new    Past Surgical History:  Procedure Laterality Date   ABDOMINAL HYSTERECTOMY     APPENDECTOMY     BREAST BIOPSY     biopsies on right x 2   BREAST CYST ASPIRATION Left    CHOLECYSTECTOMY N/A 11/29/2015   Procedure: LAPAROSCOPIC CHOLECYSTECTOMY;  Surgeon: Almond Lint, MD;  Location: [facility] OR;  Service: General;  Laterality: N/A;   COLONOSCOPY     DILATION AND CURETTAGE OF UTERUS     LIVER BIOPSY     MELANOMA EXCISION     removed from left leg   scalene surgery  1972   "trying to find out what was wrong with her lungs"   THYROID SURGERY       Home Medications:  Prior to Admission medications   Medication Sig Start Date End Date Taking? Authorizing Provider  acetaminophen (TYLENOL) 325 MG tablet Take 325-650 mg by mouth 2 (two) times daily as needed for mild pain or headache.   Yes [provider]  acetaminophen (TYLENOL) 500 MG tablet Take 500 mg by mouth at bedtime.   Yes [provider]  calcium carbonate (TUMS - DOSED IN MG ELEMENTAL CALCIUM) 500 MG chewable tablet Chew 1 tablet by mouth 2 (two) times daily as needed for indigestion or heartburn. 05/09/09  Yes [provider]  Carboxymethylcellulose Sodium (REFRESH LIQUIGEL OP) Place 1 drop into both eyes at bedtime.   Yes [provider]  diltiazem (TIAZAC) 360 MG 24 hr capsule Take 360 mg by mouth at bedtime.   Yes [provider]  irbesartan-hydrochlorothiazide (AVALIDE) 300-12.5 MG tablet Take 0.5 tablets by mouth daily. Patient taking differently: Take 0.5 tablets by mouth at bedtime. 09/14/18  Yes Corky Crafts, MD  levothyroxine (SYNTHROID, LEVOTHROID) 100 MCG tablet Take 100 mcg by mouth daily before breakfast.  10/07/14  Yes [provider]  LORazepam (ATIVAN) 1 MG tablet  Take 1.5 mg by mouth See admin instructions. Take 1.5 mg by mouth at bedtime and an additional 1-1.5mg  once a day as needed for anxiety   Yes [provider]  Lutein-Zeaxanthin 25-5 MG CAPS Take 1 capsule by mouth daily with breakfast.   Yes [provider]  OXYGEN Inhale 3.5 L/min into the lungs at bedtime.   Yes [provider]  PRESCRIPTION MEDICATION CPAP- At bedtime   Yes [provider]  SIMPLY SALINE NA Place 1 spray into both nostrils as needed (for dryness).   Yes [provider]  triamcinolone cream (KENALOG) 0.1 % Apply 1 Application topically See admin instructions. Apply to both legs 2 times a day 04/05/21  Yes [provider]  warfarin (COUMADIN) 5 MG tablet Take 5 mg by mouth at bedtime. 04/25/15  Yes [provider]  doxycycline (VIBRAMYCIN) 100 MG capsule Take 1 capsule (100 mg total) by mouth 2 (two) times daily for 10 days. Patient not taking: Reported on 09/18/2022 09/18/22 09/28/22  Trevor Iha, FNP    Inpatient Medications: Scheduled Meds:  ciprofloxacin  500 mg Oral BID   diltiazem  360 mg Oral Daily   docusate sodium  100 mg Oral BID   furosemide  40 mg Intravenous BID   irbesartan  150 mg Oral QHS   levothyroxine  100 mcg Oral Q0600   linezolid  600 mg Oral Q12H   LORazepam  1.5 mg Oral QHS   nebivolol  2.5 mg Oral Daily   Warfarin - Pharmacist Dosing Inpatient   Does not apply q1600   Continuous Infusions:  PRN Meds: acetaminophen **OR** acetaminophen, albuterol, benzocaine, calcium carbonate, LORazepam, ondansetron **OR** ondansetron (ZOFRAN) IV, mouth rinse, oxyCODONE, phenol, polyethylene glycol  Allergies:    Allergies  Allergen Reactions   Codeine Other (See Comments)    Caused Hyperactivity and Dysphoria   Erythromycin Itching, Anxiety and Other (See Comments)    Also with jitters   Tetracyclines & Related Itching    Also with jitters   Statins Other (See Comments)    Leg muscle  weakness   Atenolol Other (See Comments)    Depression    Citalopram Hydrobromide Nausea Only and Other (See Comments)    Nausea/Fatigue   Clindamycin/Lincomycin Other (See Comments)    Via IV, has caused a metallic taste in the mouth    Coenzyme Q10 Itching   Losartan Potassium Other (See Comments)    Muscle aches    Motrin [Ibuprofen] Other (See Comments)    Is to not take this   Rosuvastatin Other (See Comments)    Myalgias   Simvastatin Other (See Comments)    Myalgias   Tape     PAPER TAPE IS PPEFERRED   Latex Rash    Social History:   Social History   Socioeconomic History   Marital status: Married    Spouse name: Not on file   Number of children: Not on file   Years of education: Not on file   Highest education level: Not on file  Occupational History   Occupation: Retired    Associate Professor: RETIRED  Tobacco Use   Smoking status: Never   Smokeless tobacco: Never  Vaping Use   Vaping Use: Never used  Substance and Sexual Activity   Alcohol use: No   Drug use: No   Sexual activity: Not on file  Other Topics Concern   Not on file  Social History Narrative   Caffeine: 8 oz soda per day   Social Determinants of Health   Financial Resource Strain: Not on file  Food Insecurity: No Food Insecurity (09/18/2022)   Hunger Vital Sign    Worried About Running Out of Food in the Last Year: Never true    Ran Out of Food in the Last Year: Never true  Transportation Needs: No Transportation Needs (09/18/2022)   PRAPARE - Administrator, Civil Service (Medical): No    Lack of Transportation (Non-Medical): No  Physical Activity: Not on file  Stress: Not on file  Social Connections: Not on file  Intimate Partner Violence: Not At Risk (09/18/2022)   Humiliation, Afraid, Rape, and Kick questionnaire    Fear of Current or Ex-Partner: No  Emotionally Abused: No    Physically Abused: No    Sexually Abused: No    Family History:    Family History  Problem  Relation Age of Onset   Breast cancer Mother    CVA Mother    Breast cancer Sister    Breast cancer Sister    Heart attack Neg Hx    Sleep apnea Neg Hx      ROS:  Constitutional: Denied fever, chills, malaise, night sweats Eyes: Denied vision change or loss Ears/Nose/Mouth/Throat: Denied ear ache, sore throat, coughing, sinus pain Cardiovascular: see HPI  Respiratory: see HPI  Gastrointestinal: Denied nausea, vomiting, abdominal pain, diarrhea Genital/Urinary: Denied dysuria, hematuria, urinary frequency/urgency Musculoskeletal: Denied muscle ache, joint pain, weakness Skin: Denied rash, wound Neuro: Denied headache, dizziness, syncope Psych: history of depression/anxiety  Endocrine: Denied history of diabetes    Physical Exam/Data:   Vitals:   09/23/22 1300 09/23/22 1400 09/23/22 2036 09/24/22 0340  BP:  (!) 157/85 (!) 153/95 (!) 144/79  Pulse:  83 (!) 113 84  Resp:  17 20 20   Temp:  98.4 F (36.9 C) 98.7 F (37.1 C) 98 F (36.7 C)  TempSrc:  Oral Oral Oral  SpO2: 92% 96% 97% 91%  Weight:    107.3 kg  Height:        Intake/Output Summary (Last 24 hours) at 09/24/2022 1215 Last data filed at 09/24/2022 0538 Gross per 24 hour  Intake 933 ml  Output 1800 ml  Net -867 ml      09/24/2022    3:40 AM 09/22/2022    6:55 AM 09/18/2022    9:00 PM  Last 3 Weights  Weight (lbs) 236 lb 8.9 oz 244 lb 0.8 oz 240 lb 1.3 oz  Weight (kg) 107.3 kg 110.7 kg 108.9 kg     Body mass index is 40.6 kg/m.   Vitals:  Vitals:   09/23/22 2036 09/24/22 0340  BP: (!) 153/95 (!) 144/79  Pulse: (!) 113 84  Resp: 20 20  Temp: 98.7 F (37.1 C) 98 F (36.7 C)  SpO2: 97% 91%   General Appearance: In no apparent distress, laying in bed HEENT: Normocephalic, atraumatic.  Neck: Supple, trachea midline, no JVDs Cardiovascular: Irregularly irregular, normal S1-S2,  no murmur Respiratory: Resting breathing unlabored, lungs sounds clear to auscultation bilaterally, no use of accessory  muscles. On room air.   Gastrointestinal: Bowel sounds positive, abdomen soft, obese Extremities: Able to move all extremities in bed without difficulty, BLE with chronic venous stasis change, 1+ edema, erythema bilaterally Musculoskeletal: Normal muscle bulk and tone Skin: Intact, warm, dry. No rashes or petechiae noted in exposed areas.  Neurologic: Alert, oriented to person, place and time. Fluent speech,no cognitive deficit Psychiatric: Anxious     EKG:  The EKG was personally reviewed and demonstrates:    EKG from 09/18/22 showed AFIB RVR 116 bpm  Telemetry:  Telemetry was personally reviewed and demonstrates:    A fib with VR 90s, had VR up to 130s   Relevant CV Studies:   Echo from 09/20/22:   1. Poor echo windows   2. Challenging study despite using Definity contrast. Tachycardia and  poor echo windows limit accurate assessment of LVEF. Left ventricular  ejection fraction, by estimation, is 40 to 50%. The left ventricle has  mildly decreased function. Left  ventricular endocardial border not optimally defined to evaluate regional  wall motion despite using Definity contrast. Recommend obtaining limited  echo with contrast when tachycardia is resolved. There is  mild left  ventricular hypertrophy. Indeterminate  diastolic filling due to E-A fusion.   3. Right ventricular systolic function is mildly reduced. The right  ventricular size is moderately enlarged. Tricuspid regurgitation signal is  inadequate for assessing PA pressure.   4. The mitral valve was not well visualized. No evidence of mitral valve  regurgitation. No evidence of mitral stenosis.   5. The aortic valve is tricuspid. Aortic valve regurgitation is not  visualized. No aortic stenosis is present.   Comparison(s): No prior Echocardiogram.   Echo from 05/27/12:  LVEF 60-65% Mild concentric LVH Mild LAE   Laboratory Data:  High Sensitivity Troponin:  No results for input(s): "TROPONINIHS" in the  last 720 hours.   Chemistry Recent Labs  Lab 09/22/22 0427 09/23/22 0407 09/24/22 0401  NA 132* 134* 134*  K 4.0 3.5 3.4*  CL 96* 96* 97*  CO2 29 29 30   GLUCOSE 127* 121* 148*  BUN 11 13 15   CREATININE 0.67 0.67 0.86  CALCIUM 8.3* 8.4* 8.4*  MG 2.5* 2.0 2.0  GFRNONAA >60 >60 >60  ANIONGAP 7 9 7     Recent Labs  Lab 09/18/22 1519  PROT 7.3  ALBUMIN 2.9*  AST 26  ALT 34  ALKPHOS 64  BILITOT 0.5   Lipids No results for input(s): "CHOL", "TRIG", "HDL", "LABVLDL", "LDLCALC", "CHOLHDL" in the last 168 hours.  Hematology Recent Labs  Lab 09/22/22 0427 09/23/22 0407 09/24/22 0401  WBC 9.4 8.8 8.0  RBC 4.14 4.30 4.33  HGB 12.5 12.8 13.0  HCT 39.0 40.1 40.6  MCV 94.2 93.3 93.8  MCH 30.2 29.8 30.0  MCHC 32.1 31.9 32.0  RDW 15.9* 15.6* 15.5  PLT 177 236 274   Thyroid  Recent Labs  Lab 09/21/22 0436  TSH 2.282    BNP Recent Labs  Lab 09/22/22 0427 09/23/22 0407 09/24/22 0401  BNP 129.9* 162.3* 186.1*    DDimer No results for input(s): "DDIMER" in the last 168 hours.   Radiology/Studies:  DG CHEST PORT 1 VIEW  Result Date: 09/20/2022 CLINICAL DATA:  Shortness of breath EXAM: PORTABLE CHEST 1 VIEW COMPARISON:  CT chest dated 05/05/2012 FINDINGS: Mild patchy bilateral lower lobe opacities, raising concern for infection/pneumonia, less likely atelectasis. No pleural effusion or pneumothorax. The heart is top-normal in size. IMPRESSION: Mild patchy bilateral lower lobe opacities, raising concern for infection/pneumonia, less likely atelectasis. Electronically Signed   By: Charline Bills M.D.   On: 09/20/2022 21:11   ECHOCARDIOGRAM COMPLETE  Result Date: 09/20/2022    ECHOCARDIOGRAM REPORT   Patient Name:   WILMARIE SPARLIN Holmes Date of Exam: 09/20/2022 Medical Rec #:  [id number]            Height:       64.0 in Accession #:    [id number]           Weight:       240.1 lb Date of Birth:  12-05-1940            BSA:          2.114 m Patient Age:    82 years              BP:           135/88 mmHg Patient Gender: F                    HR:           124 bpm. Exam Location:  Inpatient Procedure: 2D Echo,  Cardiac Doppler and Color Doppler Indications:    Dyspnea  History:        Patient has prior history of Echocardiogram examinations, most                 recent 05/17/2012. Arrythmias:Atrial Fibrillation,                 Signs/Symptoms:Dyspnea and Shortness of Breath; Risk                 Factors:Hypertension and Sleep Apnea.  Sonographer:    Lucy Antigua Referring Phys: [id number] ERIC J [place]  Sonographer Comments: Technically difficult study due to poor echo windows. Image acquisition challenging due to patient body habitus and Image acquisition challenging due to respiratory motion. IMPRESSIONS  1. Poor echo windows  2. Challenging study despite using Definity contrast. Tachycardia and poor echo windows limit accurate assessment of LVEF. Left ventricular ejection fraction, by estimation, is 40 to 50%. The left ventricle has mildly decreased function. Left ventricular endocardial border not optimally defined to evaluate regional wall motion despite using Definity contrast. Recommend obtaining limited echo with contrast when tachycardia is resolved. There is mild left ventricular hypertrophy. Indeterminate diastolic filling due to E-A fusion.  3. Right ventricular systolic function is mildly reduced. The right ventricular size is moderately enlarged. Tricuspid regurgitation signal is inadequate for assessing PA pressure.  4. The mitral valve was not well visualized. No evidence of mitral valve regurgitation. No evidence of mitral stenosis.  5. The aortic valve is tricuspid. Aortic valve regurgitation is not visualized. No aortic stenosis is present. Comparison(s): No prior Echocardiogram. FINDINGS  Left Ventricle: Left ventricular ejection fraction, by estimation, is 40 to 45%. The left ventricle has mildly decreased function. Left ventricular endocardial border not optimally defined to  evaluate regional wall motion. Definity contrast agent was given IV to delineate the left ventricular endocardial borders. The left ventricular internal cavity size was normal in size. There is mild left ventricular hypertrophy. Indeterminate diastolic filling due to E-A fusion. Right Ventricle: The right ventricular size is moderately enlarged. Right vetricular wall thickness was not well visualized. Right ventricular systolic function is mildly reduced. Tricuspid regurgitation signal is inadequate for assessing PA pressure. Left Atrium: Left atrial size was normal in size. Right Atrium: Right atrial size was normal in size. Pericardium: There is no evidence of pericardial effusion. Mitral Valve: The mitral valve was not well visualized. No evidence of mitral valve regurgitation. No evidence of mitral valve stenosis. Tricuspid Valve: The tricuspid valve is not well visualized. Tricuspid valve regurgitation is not demonstrated. No evidence of tricuspid stenosis. Aortic Valve: The aortic valve is tricuspid. Aortic valve regurgitation is not visualized. No aortic stenosis is present. Aortic valve mean gradient measures 6.0 mmHg. Aortic valve peak gradient measures 10.1 mmHg. Aortic valve area, by VTI measures 2.14  cm. Pulmonic Valve: The pulmonic valve was not well visualized. Pulmonic valve regurgitation is not visualized. No evidence of pulmonic stenosis. Aorta: The aortic root and ascending aorta are structurally normal, with no evidence of dilitation. Venous: The inferior vena cava was not well visualized. IAS/Shunts: No atrial level shunt detected by color flow Doppler.  LEFT VENTRICLE PLAX 2D LVIDd:         4.30 cm   Diastology LVIDs:         3.60 cm   LV e' medial:    10.60 cm/s LV PW:         1.30 cm   LV E/e' medial:  12.9  LV IVS:        1.20 cm   LV e' lateral:   10.70 cm/s LVOT diam:     2.30 cm   LV E/e' lateral: 12.8 LV SV:         61 LV SV Index:   29 LVOT Area:     4.15 cm  LEFT ATRIUM            Index LA Vol (A4C): 50.9 ml 24.07 ml/m  AORTIC VALVE AV Area (Vmax):    2.10 cm AV Area (Vmean):   2.14 cm AV Area (VTI):     2.14 cm AV Vmax:           159.00 cm/s AV Vmean:          113.000 cm/s AV VTI:            0.287 m AV Peak Grad:      10.1 mmHg AV Mean Grad:      6.0 mmHg LVOT Vmax:         80.20 cm/s LVOT Vmean:        58.300 cm/s LVOT VTI:          0.148 m LVOT/AV VTI ratio: 0.52  AORTA Ao Asc diam: 3.20 cm MITRAL VALVE MV Area (PHT): 5.84 cm     SHUNTS MV Decel Time: 130 msec     Systemic VTI:  0.15 m MV E velocity: 137.00 cm/s  Systemic Diam: 2.30 cm Vishnu Priya Mallipeddi Electronically signed by Winfield Rast Mallipeddi Signature Date/Time: 09/20/2022/2:33:04 PM    Final      Assessment and Plan:   Permanent atrial fibrillation with RVR - TSH WNL from 09/21/22 - likely mediated by acute cellulitis, hypokalemia - keep K >4 and Mag >2 please  - on PTA cardizem 360mg , intolerant to metoprolol 75mg  BID, discussed the option of different brand name beta blocker, digoxin, amiodarone, she is not happy about any of the options, but wished to try a different beta blocker for now, next can try load digoxin, she refuses amiodarone, dofetilide is a possibility  - will stop metoprolol and start bystolic 2.5mg  daily - continue anticoagulation with coumadin   Cardiomyopathy  - Noted on Echo with LVEF 40-50%, was normal in 2013, image poor, mild LVH, mild reduce RV, mod RV enlargement - will repeat limited Echo today given controlled A fib now  - chronic BLE edema worsened with cellulitis, denied any historical dyspnea, orthopnea, PND - ? tachycardia mediated CM versus right heart failure versus diastolic failure  - at risk for CHF, ok to use lasix going forward for leg edema, 40mg  daily is probably enough - GDMT: start bystolic as above, continue irbesartan 150mg , further recommendation pending repeat Echo   HTN - BP high, up-titrate meds as needed   HLD RLE cellulitis  Hypokalemia   Hypothyroidism  OSA Obesity  Anxiety  - per primary team    Risk Assessment/Risk Scores:  {  New York Heart Association (NYHA) Functional Class NYHA Class I  CHA2DS2-VASc Score = 4   This indicates a 4.8% annual risk of stroke. The patient's score is based upon: CHF History: 0 HTN History: 1 Diabetes History: 0 Stroke History: 0 Vascular Disease History: 0 Age Score: 2 Gender Score: 1     For questions or updates, please contact  [facility] Please consult [email or web address] for contact info under    Signed, Cyndi Bender, NP  09/24/2022 12:15 PM

## 2022-09-24 NOTE — Progress Notes (Signed)
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  She reported a fever 103.42 days prior to her admission.  She reports that she completed her course of clindamycin that was prescribed by urgent care about 3 weeks ago and does also endorse chronic left extremity edema.  She then represented to urgent care who then directed her to the ED for further evaluation.  Further workup was done and she was super Fishel wound culture was sent and grew out Pseudomonas.  She was placed on Zyvox and clindamycin after being transitioned from cefepime and vancomycin.  TRH was asked admit this patient for further evaluation of her management of recurrent right leg extremity cellulitis.  She was noted to be in persistent atrial fibrillation RVR and a new cardiomyopathy.  She has been diuresed and now is improving and cardiology has been consulted for further evaluation recommendations.  Assessment and Plan:  Right lower extremity cellulitis, recurrent Patient presenting with recurrent redness, erythema and discomfort to right lower extremity.  Recently treated with 10-day course of clindamycin outpatient by urgent care; completed 3 weeks ago.  Patient is afebrile with elevated WBC count of 13.3 on admission.  Vascular duplex ultrasound right lower extremity negative for DVT.  Lactic acid within normal limits. -WBC and CRP Trend: Recent Labs  Lab  09/18/22 1519 09/19/22 0630 09/20/22 0529 09/21/22 0436 09/22/22 0427 09/23/22 0407 09/24/22 0401  WBC 13.3* 7.0 11.1* 9.3 9.4 8.8 8.0   Recent Labs  Lab 09/19/22 0720 09/20/22 0529 09/24/22 0401  CRP 20.1* 15.2* 9.9*  -ESR 66 -> 74 -Procalcitonin 0.73>0.33>0.22, <0.10 -Superficial wound culture: + Pseudomonas -Blood cultures x 2 4/11: No growth x 5 days -Blood cultures 4/13: No growth x 3 days -C/w Zyvox 600 mg PO q12 hour; plan 7 day course -Transitioned cefepime to ciprofloxacin yesterday, plan 7-day course -C/w Acetaminophen as needed -C/w Oxycodone 5 mg p.o. every 4 hours as needed moderate pain -Continue to Monitor CBC daily   Cardiomyopathy and systolic congestive heart failure, new diagnosis -TTE 09/20/2022 with LVEF 40-50 percent, LV mildly decreased function, although poor windows and given tachycardia with recommendation of obtaining limited echo with contrast when heart rate better controlled. -Initiated on Lasix  IV q12h and continuing and Cardiology recommending changing to po 40 mg Daily -Continue ARB, BB per cardiology recommendations -Strict I/O, daily weights -BNP went from 279.0 -> 129.9 -> 162.3 -> 186.1 -Cardiology consulted for further evaluation recommendations -Cardiology recommending GDMT with starting Bystolic and changing it from the metoprolol as well as continue irbesartan and further recommendations pending repeat echocardiogram -Repeat CXR in the AM   Hypokalemia -Patient's K+ Level Trend: Recent Labs  Lab 09/18/22 1519 09/19/22 0630 09/20/22 0529 09/21/22 0436 09/22/22 0427 09/23/22 0407 09/24/22 0401  K 3.4* 3.0* 3.4* 3.4* 4.0 3.5 3.4*  -Replete with po KCL 40 mEQ BID x2 -Continue to Monitor and Replete as Necessary -Repeat CMP in the AM   Persistent Atrial Fibrillation with RVR Essential Hypertension -C/w Diltiazem  360 mg p.o. daily -Started on Metoprolol tartrate; increased to 75 mg PO BID but patient does not want this  given that she "feels bad" and did not sleep; cardiology discussed with her and all of initiated Bystolic 2.5 mg daily -C/w Irbesartan 150 mg p.o. daily -Discontinued home HCTZ -Continue Coumadin, pharmacy managing -C/w Lasix 40 mg IV q12h and cardiology recommends changing to p.o. Lasix 40 mg daily eventually -Continue to Monitor on telemetry -Cardiology consulted for further evaluation and recommendations and they are recommending repeating a limited echo given that she is A-fib rate controlled now.   Hx DVT/PE on anticoagulation -Goal INR 2-3, INR 2.4 this morning, at goal -Pharmacy consulted for continued dosing/monitoring Coumadin -C/w INR daily: Last PT was 25.1 and INR is now 2.3   Hypothyroidism -C/w Levothyroxine 100 mcg p.o. daily -TSH was within normal limits at 2.282   OSA  -Continue Nocturnal CPAP  Hyponatremia -Mild. Na+ Trend: Recent Labs  Lab 09/18/22 1519 09/19/22 0630 09/20/22 0529 09/21/22 0436 09/22/22 0427 09/23/22 0407 09/24/22 0401  NA 136 137 134* 136 132* 134* 134*  -Continue to Monitor and Trend and repeat CMP in the AM   Morbid Obesity -Complicates overall prognosis and care -Estimated body mass index is 40.6 kg/m as calculated from the following:   Height as of this encounter: 5\' 4"  (1.626 m).   Weight as of this encounter: 107.3 kg.  -Weight Loss and Dietary Counseling given and discussed with patient needs for aggressive lifestyle changes/weight loss as this complicates all facets of care.   -Will need an outpatient follow-up with PCP.     DVT prophylaxis: SCDs Start: 09/18/22 1701    Code Status: Full Code Family Communication: Discussed with her husband at bedside  Disposition Plan:  Level of care: Progressive Status is: Inpatient Remains inpatient appropriate because: Needs further clinical improvement and clearance by specialists   Consultants:  Cardiology  Procedures:  ECHOCARDIOGRAM IMPRESSIONS     1. Poor echo windows    2. Challenging study despite using Definity contrast. Tachycardia and  poor echo windows limit accurate assessment of LVEF. Left ventricular  ejection fraction, by estimation, is 40 to 50%. The left ventricle has  mildly decreased function. Left  ventricular endocardial border not optimally defined to evaluate regional  wall motion despite using Definity contrast. Recommend obtaining limited  echo with contrast when tachycardia is resolved. There is mild left  ventricular hypertrophy. Indeterminate  diastolic filling due to E-A fusion.   3. Right ventricular systolic function is mildly reduced. The right  ventricular size is moderately enlarged. Tricuspid regurgitation signal is  inadequate for assessing PA pressure.   4. The mitral valve was not well visualized. No evidence of mitral valve  regurgitation. No evidence of mitral stenosis.   5. The aortic valve is tricuspid. Aortic valve regurgitation is not  visualized. No aortic stenosis is present.   Comparison(s): No prior Echocardiogram.   FINDINGS   Left Ventricle: Left ventricular ejection fraction, by estimation, is 40  to 45%. The left ventricle has mildly decreased function. Left ventricular  endocardial border not optimally defined to evaluate regional wall motion.  Definity contrast agent was  given IV to delineate the left ventricular endocardial borders. The left  ventricular internal cavity size was normal in size. There is mild left  ventricular hypertrophy. Indeterminate diastolic filling due to E-A  fusion.   Right Ventricle: The right ventricular size is moderately enlarged. Right  vetricular wall thickness was not well visualized. Right  ventricular  systolic function is mildly reduced. Tricuspid regurgitation signal is  inadequate for assessing PA pressure.   Left Atrium: Left atrial size was normal in size.   Right Atrium: Right atrial size was normal in size.   Pericardium: There is no evidence of  pericardial effusion.   Mitral Valve: The mitral valve was not well visualized. No evidence of  mitral valve regurgitation. No evidence of mitral valve stenosis.   Tricuspid Valve: The tricuspid valve is not well visualized. Tricuspid  valve regurgitation is not demonstrated. No evidence of tricuspid  stenosis.   Aortic Valve: The aortic valve is tricuspid. Aortic valve regurgitation is  not visualized. No aortic stenosis is present. Aortic valve mean gradient  measures 6.0 mmHg. Aortic valve peak gradient measures 10.1 mmHg. Aortic  valve area, by VTI measures 2.14   cm.   Pulmonic Valve: The pulmonic valve was not well visualized. Pulmonic valve  regurgitation is not visualized. No evidence of pulmonic stenosis.   Aorta: The aortic root and ascending aorta are structurally normal, with  no evidence of dilitation.   Venous: The inferior vena cava was not well visualized.   IAS/Shunts: No atrial level shunt detected by color flow Doppler.     LEFT VENTRICLE  PLAX 2D  LVIDd:         4.30 cm   Diastology  LVIDs:         3.60 cm   LV e' medial:    10.60 cm/s  LV PW:         1.30 cm   LV E/e' medial:  12.9  LV IVS:        1.20 cm   LV e' lateral:   10.70 cm/s  LVOT diam:     2.30 cm   LV E/e' lateral: 12.8  LV SV:         61  LV SV Index:   29  LVOT Area:     4.15 cm     LEFT ATRIUM           Index  LA Vol (A4C): 50.9 ml 24.07 ml/m   AORTIC VALVE  AV Area (Vmax):    2.10 cm  AV Area (Vmean):   2.14 cm  AV Area (VTI):     2.14 cm  AV Vmax:           159.00 cm/s  AV Vmean:          113.000 cm/s  AV VTI:            0.287 m  AV Peak Grad:      10.1 mmHg  AV Mean Grad:      6.0 mmHg  LVOT Vmax:         80.20 cm/s  LVOT Vmean:        58.300 cm/s  LVOT VTI:          0.148 m  LVOT/AV VTI ratio: 0.52    AORTA  Ao Asc diam: 3.20 cm   MITRAL VALVE  MV Area (PHT): 5.84 cm     SHUNTS  MV Decel Time: 130 msec     Systemic VTI:  0.15 m  MV E velocity: 137.00 cm/s   Systemic Diam: 2.30 cm   Antimicrobials:  Anti-infectives (From admission, onward)    Start     Dose/Rate Route Frequency Ordered Stop   09/23/22 2200  linezolid (ZYVOX) tablet 600 mg        600 mg Oral Every  12 hours 09/23/22 1113 09/25/22 0959   09/23/22 1200  ciprofloxacin (CIPRO) tablet 500 mg        500 mg Oral 2 times daily 09/23/22 1113 09/27/22 0759   09/21/22 0600  ceFEPIme (MAXIPIME) 2 g in sodium chloride 0.9 % 100 mL IVPB  Status:  Discontinued        2 g 200 mL/hr over 30 Minutes Intravenous Every 8 hours 09/20/22 1821 09/23/22 1312   09/20/22 2200  linezolid (ZYVOX) IVPB 600 mg  Status:  Discontinued        600 mg 300 mL/hr over 60 Minutes Intravenous Every 12 hours 09/20/22 1815 09/20/22 1816   09/20/22 2000  linezolid (ZYVOX) IVPB 600 mg  Status:  Discontinued        600 mg 300 mL/hr over 60 Minutes Intravenous Every 12 hours 09/20/22 1816 09/23/22 1113   09/20/22 1915  ceFEPIme (MAXIPIME) 2 g in sodium chloride 0.9 % 100 mL IVPB        2 g 200 mL/hr over 30 Minutes Intravenous  Once 09/20/22 1820 09/20/22 2048   09/19/22 1100  ceFAZolin (ANCEF) IVPB 2g/100 mL premix  Status:  Discontinued        2 g 200 mL/hr over 30 Minutes Intravenous Every 8 hours 09/19/22 1014 09/20/22 1815   09/18/22 1800  clindamycin (CLEOCIN) IVPB 600 mg  Status:  Discontinued        600 mg 100 mL/hr over 30 Minutes Intravenous Every 8 hours 09/18/22 1655 09/19/22 1014   09/18/22 1445  vancomycin (VANCOREADY) IVPB 2000 mg/400 mL        2,000 mg 200 mL/hr over 120 Minutes Intravenous  Once 09/18/22 1433 09/18/22 1851   09/18/22 1430  vancomycin (VANCOCIN) IVPB 1000 mg/200 mL premix  Status:  Discontinued        1,000 mg 200 mL/hr over 60 Minutes Intravenous  Once 09/18/22 1426 09/18/22 1434   09/18/22 1430  ceFEPIme (MAXIPIME) 2 g in sodium chloride 0.9 % 100 mL IVPB        2 g 200 mL/hr over 30 Minutes Intravenous  Once 09/18/22 1426 09/18/22 1606       Subjective: Seen and examined at  bedside and she thinks her leg swelling is doing little bit better.  States that she had issues with the metoprolol and did not want to take it.  States it is causing her not to sleep properly and making her more fatigued and lethargic.  Denies any chest pain or shortness of breath.  No other concerns or complaints at this time.  Objective: Vitals:   09/23/22 1300 09/23/22 1400 09/23/22 2036 09/24/22 0340  BP:  (!) 157/85 (!) 153/95 (!) 144/79  Pulse:  83 (!) 113 84  Resp:  17 20 20   Temp:  98.4 F (36.9 C) 98.7 F (37.1 C) 98 F (36.7 C)  TempSrc:  Oral Oral Oral  SpO2: 92% 96% 97% 91%  Weight:    107.3 kg  Height:        Intake/Output Summary (Last 24 hours) at 09/24/2022 1324 Last data filed at 09/24/2022 0538 Gross per 24 hour  Intake 240 ml  Output 600 ml  Net -360 ml   Filed Weights   09/18/22 2100 09/22/22 0655 09/24/22 0340  Weight: 108.9 kg 110.7 kg 107.3 kg   Examination: Physical Exam:  Constitutional: WN/WD morbidly obese Caucasian female in no acute distress but appears uncomfortable Respiratory: Diminished to auscultation bilaterally with coarse breath sounds, no wheezing, rales,  rhonchi or crackles. Normal respiratory effort and patient is not tachypenic. No accessory muscle use.  Unlabored breathing Cardiovascular: RRR, no murmurs / rubs / gallops. S1 and S2 auscultated.  Has 1+ lower extremity edema and lower extremity skin changes Abdomen: Soft, non-tender, distended secondary body habitus.  Bowel sounds positive.  GU: Deferred. Musculoskeletal: No clubbing / cyanosis of digits/nails. No joint deformity upper and lower extremities.  Skin: No rashes, lesions, ulcers on limited skin evaluation but does have some lower extremity skin changes from chronic venous stasis. No induration; Warm and dry.  Neurologic: CN 2-12 grossly intact with no focal deficits. Romberg sign and cerebellar reflexes not assessed.  Psychiatric: Normal judgment and insight. Alert and  oriented x 3. Normal mood and appropriate affect.   Data Reviewed: I have personally reviewed following labs and imaging studies  CBC: Recent Labs  Lab 09/18/22 1519 09/19/22 0630 09/20/22 0529 09/21/22 0436 09/22/22 0427 09/23/22 0407 09/24/22 0401  WBC 13.3*   < > 11.1* 9.3 9.4 8.8 8.0  NEUTROABS 10.8*  --   --   --   --   --   --   HGB 13.8   < > 12.3 12.5 12.5 12.8 13.0  HCT 42.3   < > 37.7 39.1 39.0 40.1 40.6  MCV 93.8   < > 93.8 94.0 94.2 93.3 93.8  PLT 147*   < > 164 147* 177 236 274   < > = values in this interval not displayed.   Basic Metabolic Panel: Recent Labs  Lab 09/20/22 0529 09/21/22 0436 09/22/22 0427 09/23/22 0407 09/24/22 0401  NA 134* 136 132* 134* 134*  K 3.4* 3.4* 4.0 3.5 3.4*  CL 103 99 96* 96* 97*  CO2 24 28 29 29 30   GLUCOSE 128* 130* 127* 121* 148*  BUN 11 10 11 13 15   CREATININE 0.55 0.69 0.67 0.67 0.86  CALCIUM 7.6* 8.2* 8.3* 8.4* 8.4*  MG 1.8 2.5* 2.5* 2.0 2.0   GFR: Estimated Creatinine Clearance: 58.2 mL/min (by C-G formula based on SCr of 0.86 mg/dL). Liver Function Tests: Recent Labs  Lab 09/18/22 1519  AST 26  ALT 34  ALKPHOS 64  BILITOT 0.5  PROT 7.3  ALBUMIN 2.9*   No results for input(s): "LIPASE", "AMYLASE" in the last 168 hours. No results for input(s): "AMMONIA" in the last 168 hours. Coagulation Profile: Recent Labs  Lab 09/19/22 0630 09/21/22 0436 09/22/22 0427 09/23/22 0940 09/24/22 0401  INR 2.2* 2.4* 2.6* 2.4* 2.3*   Cardiac Enzymes: No results for input(s): "CKTOTAL", "CKMB", "CKMBINDEX", "TROPONINI" in the last 168 hours. BNP (last 3 results) No results for input(s): "PROBNP" in the last 8760 hours. HbA1C: No results for input(s): "HGBA1C" in the last 72 hours. CBG: No results for input(s): "GLUCAP" in the last 168 hours. Lipid Profile: No results for input(s): "CHOL", "HDL", "LDLCALC", "TRIG", "CHOLHDL", "LDLDIRECT" in the last 72 hours. Thyroid Function Tests: No results for input(s): "TSH",  "T4TOTAL", "FREET4", "T3FREE", "THYROIDAB" in the last 72 hours. Anemia Panel: No results for input(s): "VITAMINB12", "FOLATE", "FERRITIN", "TIBC", "IRON", "RETICCTPCT" in the last 72 hours. Sepsis Labs: Recent Labs  Lab 09/18/22 1519 09/19/22 0720 09/20/22 0529 09/21/22 0436 09/23/22 0407  PROCALCITON  --  0.73 0.33 0.22 <0.10  LATICACIDVEN 1.3  --   --   --   --     Recent Results (from the past 240 hour(s))  Culture, blood (routine x 2)     Status: None   Collection Time: 09/18/22  3:00 PM   Specimen: BLOOD LEFT HAND  Result Value Ref Range Status   Specimen Description   Final    BLOOD LEFT HAND Performed at [facility], [address] [place], [place] [address]    Special Requests   Final    BOTTLES DRAWN AEROBIC AND ANAEROBIC Blood Culture results may not be optimal due to an inadequate volume of blood received in culture bottles Performed at [facility], [address], [place], [place] [address]    Culture   Final    NO GROWTH 5 DAYS Performed at [facility], [address] [place], [place] [address]    Report Status 09/23/2022 FINAL  Final  Aerobic Culture w Gram Stain (superficial specimen)     Status: None   Collection Time: 09/18/22  4:00 PM   Specimen: Wound  Result Value Ref Range Status   Specimen Description   Final    WOUND SKIN, OTHER Performed at [facility], [address], [place], [place] [address]    Special Requests   Final    NONE Performed at [facility], [address] [place], [place] [address]    Gram Stain   Final    NO WBC SEEN NO ORGANISMS SEEN Performed at [facility], [address], [place], [place] [address]    Culture RARE PSEUDOMONAS AERUGINOSA  Final   Report Status 09/21/2022 FINAL  Final   Organism ID, Bacteria PSEUDOMONAS AERUGINOSA  Final      Susceptibility   Pseudomonas aeruginosa - MIC*    CEFTAZIDIME 2 SENSITIVE  Sensitive     CIPROFLOXACIN <=0.25 SENSITIVE Sensitive     GENTAMICIN <=1 SENSITIVE Sensitive     IMIPENEM 2 SENSITIVE Sensitive     PIP/TAZO 8 SENSITIVE Sensitive     CEFEPIME 2 SENSITIVE Sensitive     * RARE PSEUDOMONAS AERUGINOSA  Culture, blood (Routine X 2) w Reflex to ID Panel     Status: None (Preliminary result)   Collection Time: 09/20/22  6:09 PM   Specimen: BLOOD RIGHT ARM  Result Value Ref Range Status   Specimen Description BLOOD RIGHT ARM  Final   Special Requests   Final    BOTTLES DRAWN AEROBIC AND ANAEROBIC Blood Culture adequate volume   Culture   Final    NO GROWTH 4 DAYS Performed at [facility], [address], [place], [place] [address]    Report Status PENDING  Incomplete  Culture, blood (Routine X 2) w Reflex to ID Panel     Status: None (Preliminary result)   Collection Time: 09/20/22  6:09 PM   Specimen: BLOOD LEFT HAND  Result Value Ref Range Status   Specimen Description BLOOD LEFT HAND  Final   Special Requests   Final    BOTTLES DRAWN AEROBIC AND ANAEROBIC Blood Culture adequate volume   Culture   Final    NO GROWTH 4 DAYS Performed at [facility], [address] [place], [place] [address]    Report Status PENDING  Incomplete    Radiology Studies: No results found.  Scheduled Meds:  ciprofloxacin  500 mg Oral BID   diltiazem  360 mg Oral Daily   docusate sodium  100 mg Oral BID   furosemide  40 mg Intravenous BID   irbesartan  150 mg Oral QHS   levothyroxine  100 mcg Oral Q0600   linezolid  600 mg Oral Q12H  LORazepam  1.5 mg Oral QHS   nebivolol  2.5 mg Oral Daily   Warfarin - Pharmacist Dosing Inpatient   Does not apply q1600   Continuous Infusions:   LOS: 6 days   Marguerita Merles, DO [facility] Available via Epic secure chat 7am-7pm After these hours, please refer to coverage provider listed on [email or web address] 09/24/2022, 1:24 PM

## 2022-09-25 DIAGNOSIS — I5031 Acute diastolic (congestive) heart failure: Secondary | ICD-10-CM

## 2022-09-25 DIAGNOSIS — I4891 Unspecified atrial fibrillation: Secondary | ICD-10-CM | POA: Diagnosis not present

## 2022-09-25 DIAGNOSIS — I10 Essential (primary) hypertension: Secondary | ICD-10-CM | POA: Diagnosis not present

## 2022-09-25 DIAGNOSIS — L0390 Cellulitis, unspecified: Secondary | ICD-10-CM | POA: Diagnosis not present

## 2022-09-25 DIAGNOSIS — L03115 Cellulitis of right lower limb: Secondary | ICD-10-CM | POA: Diagnosis not present

## 2022-09-25 DIAGNOSIS — Z6841 Body Mass Index (BMI) 40.0 and over, adult: Secondary | ICD-10-CM

## 2022-09-25 DIAGNOSIS — I480 Paroxysmal atrial fibrillation: Secondary | ICD-10-CM

## 2022-09-25 LAB — COMPREHENSIVE METABOLIC PANEL
ALT: 24 U/L (ref 0–44)
AST: 21 U/L (ref 15–41)
Albumin: 2.8 g/dL — ABNORMAL LOW (ref 3.5–5.0)
Alkaline Phosphatase: 60 U/L (ref 38–126)
Anion gap: 10 (ref 5–15)
BUN: 16 mg/dL (ref 8–23)
CO2: 29 mmol/L (ref 22–32)
Calcium: 8.6 mg/dL — ABNORMAL LOW (ref 8.9–10.3)
Chloride: 97 mmol/L — ABNORMAL LOW (ref 98–111)
Creatinine, Ser: 0.74 mg/dL (ref 0.44–1.00)
GFR, Estimated: >60 mL/min (ref >60)
Glucose, Bld: 115 mg/dL — ABNORMAL HIGH (ref 70–99)
Potassium: 3.8 mmol/L (ref 3.5–5.1)
Sodium: 136 mmol/L (ref 135–145)
Total Bilirubin: 0.7 mg/dL (ref 0.3–1.2)
Total Protein: 7.5 g/dL (ref 6.5–8.1)

## 2022-09-25 LAB — CBC WITH DIFFERENTIAL/PLATELET
Abs Immature Granulocytes: 0.06 10*3/uL (ref 0.00–0.07)
Basophils Absolute: 0.1 10*3/uL (ref 0.0–0.1)
Basophils Relative: 1 %
Eosinophils Absolute: 0.2 10*3/uL (ref 0.0–0.5)
Eosinophils Relative: 2 %
HCT: 42.1 % (ref 36.0–46.0)
Hemoglobin: 13.5 g/dL (ref 12.0–15.0)
Immature Granulocytes: 1 %
Lymphocytes Relative: 22 %
Lymphs Abs: 1.9 10*3/uL (ref 0.7–4.0)
MCH: 30.3 pg (ref 26.0–34.0)
MCHC: 32.1 g/dL (ref 30.0–36.0)
MCV: 94.4 fL (ref 80.0–100.0)
Monocytes Absolute: 1.2 10*3/uL — ABNORMAL HIGH (ref 0.1–1.0)
Monocytes Relative: 14 %
Neutro Abs: 5.1 10*3/uL (ref 1.7–7.7)
Neutrophils Relative %: 60 %
Platelets: 303 10*3/uL (ref 150–400)
RBC: 4.46 MIL/uL (ref 3.87–5.11)
RDW: 15.4 % (ref 11.5–15.5)
WBC: 8.4 10*3/uL (ref 4.0–10.5)
nRBC: 0 % (ref 0.0–0.2)

## 2022-09-25 LAB — CULTURE, BLOOD (ROUTINE X 2): Special Requests: ADEQUATE

## 2022-09-25 LAB — C-REACTIVE PROTEIN: CRP: 6.2 mg/dL — ABNORMAL HIGH (ref <1.0)

## 2022-09-25 LAB — PHOSPHORUS: Phosphorus: 3.1 mg/dL (ref 2.5–4.6)

## 2022-09-25 LAB — PROTIME-INR
INR: 2.4 — ABNORMAL HIGH (ref 0.8–1.2)
Prothrombin Time: 25.9 seconds — ABNORMAL HIGH (ref 11.4–15.2)

## 2022-09-25 LAB — MAGNESIUM: Magnesium: 2 mg/dL (ref 1.7–2.4)

## 2022-09-25 MED ORDER — NEBIVOLOL HCL 5 MG PO TABS
5.0000 mg | ORAL_TABLET | Freq: Every day | ORAL | Status: DC
  Administered 2022-09-25 – 2022-09-26 (×2): 5 mg via ORAL
  Filled 2022-09-25 (×2): qty 1

## 2022-09-25 MED ORDER — WARFARIN SODIUM 5 MG PO TABS
5.0000 mg | ORAL_TABLET | Freq: Every day | ORAL | Status: DC
  Administered 2022-09-25 – 2022-09-26 (×2): 5 mg via ORAL
  Filled 2022-09-25 (×2): qty 1

## 2022-09-25 MED ORDER — FUROSEMIDE 40 MG PO TABS
40.0000 mg | ORAL_TABLET | Freq: Every day | ORAL | Status: DC
  Administered 2022-09-26: 40 mg via ORAL
  Filled 2022-09-25: qty 1

## 2022-09-25 MED ORDER — NEBIVOLOL HCL 10 MG PO TABS
10.0000 mg | ORAL_TABLET | Freq: Every day | ORAL | Status: DC
  Filled 2022-09-25: qty 1

## 2022-09-25 MED ORDER — DILTIAZEM HCL ER COATED BEADS 180 MG PO CP24
360.0000 mg | ORAL_CAPSULE | Freq: Every day | ORAL | Status: DC
  Administered 2022-09-25 – 2022-09-26 (×2): 360 mg via ORAL
  Filled 2022-09-25 (×2): qty 2

## 2022-09-25 MED ORDER — IRBESARTAN 300 MG PO TABS
300.0000 mg | ORAL_TABLET | Freq: Every day | ORAL | Status: DC
  Administered 2022-09-26: 300 mg via ORAL
  Filled 2022-09-25: qty 1

## 2022-09-25 MED ORDER — NEBIVOLOL HCL 5 MG PO TABS: 5.0000 mg | ORAL_TABLET | Freq: Every day | ORAL | Status: DC

## 2022-09-25 NOTE — Progress Notes (Signed)
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  She reported a fever 103.42 days prior to her admission.  She reports that she completed her course of clindamycin that was prescribed by urgent care about 3 weeks ago and does also endorse chronic left extremity edema.  She then represented to urgent care who then directed her to the ED for further evaluation.  Further workup was done and she was super Fishel wound culture was sent and grew out Pseudomonas.  She was placed on Zyvox and clindamycin after being transitioned from cefepime and vancomycin.  TRH was asked admit this patient for further evaluation of her management of recurrent right leg extremity cellulitis.  She was noted to be in persistent atrial fibrillation RVR and a new cardiomyopathy.  She has been diuresed and now is improving and cardiology has been consulted for further evaluation recommendations and made medication adjustments and changes as below.    Assessment and Plan:  Right lower extremity cellulitis, recurrent Patient presenting with recurrent redness, erythema and discomfort to right lower extremity.  Recently treated with 10-day course of clindamycin outpatient by urgent care; completed 3 weeks ago.  Patient is afebrile with elevated WBC count of 13.3 on admission.  Vascular duplex ultrasound right lower extremity negative for DVT.  Lactic acid  within normal limits. -WBC and CRP Trend: Recent Labs  Lab 09/19/22 0630 09/19/22 0720 09/20/22 0529 09/21/22 0436 09/22/22 0427 09/23/22 0407 09/24/22 0401 09/25/22 0424  WBC 7.0  --  11.1* 9.3 9.4 8.8 8.0 8.4  CRP  --    < > 15.2*  --   --   --  9.9*  --    < > = values in this interval not displayed.  -ESR 66 -> 74 -Procalcitonin 0.73>0.33>0.22, <0.10 -Superficial wound culture: + Pseudomonas -Blood cultures x 2 4/11: No growth x 5 days -Blood cultures 4/13: No growth x 3 days -C/w Zyvox 600 mg PO q12 hour; plan 7 day course -Transitioned cefepime to ciprofloxacin the day before yesterday, plan 7-day course -C/w Acetaminophen as needed -C/w Oxycodone 5 mg p.o. every 4 hours as needed moderate pain -Continue to Monitor CBC daily   Cardiomyopathy and New Acute Diastolic CHF, Systolic CHF ruled out -TTE 09/20/2022 with LVEF 40-50 percent, LV mildly decreased function, although poor windows and given tachycardia with recommendation of obtaining limited echo with contrast when heart rate better controlled. -Repeat ECHO done and showed a LV function of 55-60% with no regional wall motion abnormalities -Initiated on Lasix 40mg  IV q12h and continuing and Cardiology recommending changing to po 40 mg Daily -Continue ARB, BB per cardiology recommendations -Strict I/O, daily weights  Intake/Output Summary (Last 24 hours) at 09/25/2022 1211 Last data filed at 09/25/2022 1055 Gross per 24 hour  Intake 1190 ml  Output 2450 ml  Net -1260 ml  -BNP went from 279.0 -> 129.9 -> 162.3 -> 186.1 on last check -Cardiology consulted for further evaluation recommendations -Cardiology recommending stopping Sherryll Burger  and restarting irbesartan and continuing Bystolic and changing IV Lasix to p.o. Lasix 40 mg daily -Cardiology will not initiate and initiate L2 2 inhibitor given that she has urinary frequency and morbid obesity and likely a very high risk for vaginal yeast infection -Repeat CXR in the AM    Hypokalemia -Patient's K+ Level Trend: Recent Labs  Lab 09/19/22 0630 09/20/22 0529 09/21/22 0436 09/22/22 0427 09/23/22 0407 09/24/22 0401 09/25/22 0424  K 3.0* 3.4* 3.4* 4.0 3.5 3.4* 3.8  -Replete with po KCL 40 mEQ BID x2 -Continue to Monitor and Replete as Necessary -Repeat CMP in the AM    Persistent Atrial Fibrillation with RVR Essential Hypertension -Resumed Diltiazem 360 mg p.o. daily (Held today because her initial echo came back as a left ventricular function had mild dysfunction but was a poor study so limited echo showed normal LV function with an EF of 55 to 60%) -Started on Metoprolol tartrate; increased to 75 mg PO BID but patient does not want this given that she "feels bad" and did not sleep; cardiology discussed with her and all of initiated Bystolic 2.5 mg daily and increased to 5 mg daily  -Her Irbesartan 150 mg p.o. daily was held yesterday and discontinued given that her initial echo showed LV dysfunction so she was started on Entresto but repeat echo shows better images and showed a normal LV function so Entresto being stopped again and resuming her home Irbesartan starting tomorrow -Discontinued home HCTZ -Continue Coumadin, pharmacy managing -IV Lasix changed to po Lasix 40 mg Daily  -Continue to Monitor on telemetry -Cardiology consulted for further evaluation and recommendations and they are recommending repeating a limited echo given that she is A-fib rate controlled now.  -Limited ECHO done and showed "Left ventricular ejection fraction, by estimation, is 55 to 60%. The left ventricle has normal function. The left ventricle has no regional wall motion abnormalities. The left ventricular internal cavity size was normal in size. There is no left ventricular hypertrophy."   Hx DVT/PE on anticoagulation -Goal INR 2-3, INR 2.4 this morning, at goal -Pharmacy consulted for continued dosing/monitoring Coumadin -C/w INR daily: Last PT was 25.9 and INR is now  2.4   Hypothyroidism -C/w Levothyroxine 100 mcg p.o. daily -TSH was within normal limits at 2.282   OSA  -Continue Nocturnal CPAP   Hyponatremia -Mild. Na+ Trend: Recent Labs  Lab 09/19/22 0630 09/20/22 0529 09/21/22 0436 09/22/22 0427 09/23/22 0407 09/24/22 0401 09/25/22 0424  NA 137 134* 136 132* 134* 134* 136  -Continue to Monitor and Trend and repeat CMP in the AM   Hypoalbuminemia -Patient's Albumin Trend: Recent Labs  Lab 09/18/22 1519 09/25/22 0424  ALBUMIN 2.9* 2.8*  -Continue to Monitor and Trend and repeat CMP in the AM   Morbid Obesity -Complicates overall prognosis and care -Estimated body mass index is 40.19 kg/m as calculated from the following:   Height as of this encounter:  (1.626 m).   Weight as of this encounter: 106.2 kg.  -Weight Loss and Dietary Counseling given and discussed with patient needs for aggressive lifestyle changes/weight loss as this complicates all facets of care.   -Will need an outpatient follow-up with PCP.    DVT prophylaxis: SCDs Start: 09/18/22 1701 warfarin (COUMADIN) tablet 5 mg    Code Status: Full Code Family Communication: No family present at bedside   Disposition Plan:  Level of care: Progressive Status is: Inpatient Remains inpatient appropriate because: Needs further clinical improvement and ambulation and clearance by  cardiology and anticipating discharging home in next 24 hours   Consultants:  Cardiology  Procedures:  ECHOCARDIOGRAM IMPRESSIONS     1. Poor echo windows   2. Challenging study despite using Definity contrast. Tachycardia and  poor echo windows limit accurate assessment of LVEF. Left ventricular  ejection fraction, by estimation, is 40 to 50%. The left ventricle has  mildly decreased function. Left  ventricular endocardial border not optimally defined to evaluate regional  wall motion despite using Definity contrast. Recommend obtaining limited  echo with contrast when tachycardia  is resolved. There is mild left  ventricular hypertrophy. Indeterminate  diastolic filling due to E-A fusion.   3. Right ventricular systolic function is mildly reduced. The right  ventricular size is moderately enlarged. Tricuspid regurgitation signal is  inadequate for assessing PA pressure.   4. The mitral valve was not well visualized. No evidence of mitral valve  regurgitation. No evidence of mitral stenosis.   5. The aortic valve is tricuspid. Aortic valve regurgitation is not  visualized. No aortic stenosis is present.   Comparison(s): No prior Echocardiogram.   FINDINGS   Left Ventricle: Left ventricular ejection fraction, by estimation, is 40  to 45%. The left ventricle has mildly decreased function. Left ventricular  endocardial border not optimally defined to evaluate regional wall motion.  Definity contrast agent was  given IV to delineate the left ventricular endocardial borders. The left  ventricular internal cavity size was normal in size. There is mild left  ventricular hypertrophy. Indeterminate diastolic filling due to E-A  fusion.   Right Ventricle: The right ventricular size is moderately enlarged. Right  vetricular wall thickness was not well visualized. Right ventricular  systolic function is mildly reduced. Tricuspid regurgitation signal is  inadequate for assessing PA pressure.   Left Atrium: Left atrial size was normal in size.   Right Atrium: Right atrial size was normal in size.   Pericardium: There is no evidence of pericardial effusion.   Mitral Valve: The mitral valve was not well visualized. No evidence of  mitral valve regurgitation. No evidence of mitral valve stenosis.   Tricuspid Valve: The tricuspid valve is not well visualized. Tricuspid  valve regurgitation is not demonstrated. No evidence of tricuspid  stenosis.   Aortic Valve: The aortic valve is tricuspid. Aortic valve regurgitation is  not visualized. No aortic stenosis is present.  Aortic valve mean gradient  measures 6.0 mmHg. Aortic valve peak gradient measures 10.1 mmHg. Aortic  valve area, by VTI measures 2.14   cm.   Pulmonic Valve: The pulmonic valve was not well visualized. Pulmonic valve  regurgitation is not visualized. No evidence of pulmonic stenosis.   Aorta: The aortic root and ascending aorta are structurally normal, with  no evidence of dilitation.   Venous: The inferior vena cava was not well visualized.   IAS/Shunts: No atrial level shunt detected by color flow Doppler.     LEFT VENTRICLE  PLAX 2D  LVIDd:         4.30 cm   Diastology  LVIDs:         3.60 cm   LV e' medial:    10.60 cm/s  LV PW:         1.30 cm   LV E/e' medial:  12.9  LV IVS:        1.20 cm   LV e' lateral:   10.70 cm/s  LVOT diam:     2.30 cm   LV E/e' lateral: 12.8  LV  SV:         61  LV SV Index:   29  LVOT Area:     4.15 cm     LEFT ATRIUM           Index  LA Vol (A4C): 50.9 ml 24.07 ml/m   AORTIC VALVE  AV Area (Vmax):    2.10 cm  AV Area (Vmean):   2.14 cm  AV Area (VTI):     2.14 cm  AV Vmax:           159.00 cm/s  AV Vmean:          113.000 cm/s  AV VTI:            0.287 m  AV Peak Grad:      10.1 mmHg  AV Mean Grad:      6.0 mmHg  LVOT Vmax:         80.20 cm/s  LVOT Vmean:        58.300 cm/s  LVOT VTI:          0.148 m  LVOT/AV VTI ratio: 0.52    AORTA  Ao Asc diam: 3.20 cm   MITRAL VALVE  MV Area (PHT): 5.84 cm     SHUNTS  MV Decel Time: 130 msec     Systemic VTI:  0.15 m  MV E velocity: 137.00 cm/s  Systemic Diam: 2.30 cm   REPEAT LIMITED ECHOCARDIOGRAM IMPRESSIONS     1. Left ventricular ejection fraction, by estimation, is 55 to 60%. The  left ventricle has normal function. The left ventricle has no regional  wall motion abnormalities.   2. Right ventricular systolic function is normal. The right ventricular  size is not well visualized.   3. Left atrial size was mildly dilated.   4. The mitral valve is normal in structure. No  evidence of mitral  stenosis.   5. Aortic valve regurgitation is not visualized. Aortic valve  sclerosis/calcification is present, without any evidence of aortic  stenosis.   Comparison(s): Prior images reviewed side by side. The left ventricular  function has improved. No longer in atrial fibrillation.   FINDINGS   Left Ventricle: Left ventricular ejection fraction, by estimation, is 55  to 60%. The left ventricle has normal function. The left ventricle has no  regional wall motion abnormalities. The left ventricular internal cavity  size was normal in size. There is   no left ventricular hypertrophy.   Right Ventricle: The right ventricular size is not well visualized. Right  ventricular systolic function is normal.   Left Atrium: Left atrial size was mildly dilated.   Right Atrium: Right atrial size was not well visualized.   Pericardium: There is no evidence of pericardial effusion.   Mitral Valve: The mitral valve is normal in structure. Mild mitral annular  calcification. No evidence of mitral valve stenosis.   Tricuspid Valve: The tricuspid valve is not well visualized.   Aortic Valve: Aortic valve regurgitation is not visualized. Aortic valve  sclerosis/calcification is present, without any evidence of aortic  stenosis.   Pulmonic Valve: The pulmonic valve was not well visualized.   Aorta: The aortic root and ascending aorta are structurally normal, with  no evidence of dilitation.   IAS/Shunts: The interatrial septum was not well visualized.   LEFT VENTRICLE  PLAX 2D  LVIDd:         4.90 cm  LV PW:         1.20 cm  LV IVS:        1.00 cm      Antimicrobials:  Anti-infectives (From admission, onward)    Start     Dose/Rate Route Frequency Ordered Stop   09/23/22 2200  linezolid (ZYVOX) tablet 600 mg        600 mg Oral Every 12 hours 09/23/22 1113 09/24/22 2331   09/23/22 1200  ciprofloxacin (CIPRO) tablet 500 mg        500 mg Oral 2 times daily 09/23/22  1113 09/27/22 0759   09/21/22 0600  ceFEPIme (MAXIPIME) 2 g in sodium chloride 0.9 % 100 mL IVPB  Status:  Discontinued        2 g 200 mL/hr over 30 Minutes Intravenous Every 8 hours 09/20/22 1821 09/23/22 1312   09/20/22 2200  linezolid (ZYVOX) IVPB 600 mg  Status:  Discontinued        600 mg 300 mL/hr over 60 Minutes Intravenous Every 12 hours 09/20/22 1815 09/20/22 1816   09/20/22 2000  linezolid (ZYVOX) IVPB 600 mg  Status:  Discontinued        600 mg 300 mL/hr over 60 Minutes Intravenous Every 12 hours 09/20/22 1816 09/23/22 1113   09/20/22 1915  ceFEPIme (MAXIPIME) 2 g in sodium chloride 0.9 % 100 mL IVPB        2 g 200 mL/hr over 30 Minutes Intravenous  Once 09/20/22 1820 09/20/22 2048   09/19/22 1100  ceFAZolin (ANCEF) IVPB 2g/100 mL premix  Status:  Discontinued        2 g 200 mL/hr over 30 Minutes Intravenous Every 8 hours 09/19/22 1014 09/20/22 1815   09/18/22 1800  clindamycin (CLEOCIN) IVPB 600 mg  Status:  Discontinued        600 mg 100 mL/hr over 30 Minutes Intravenous Every 8 hours 09/18/22 1655 09/19/22 1014   09/18/22 1445  vancomycin (VANCOREADY) IVPB 2000 mg/400 mL        2,000 mg 200 mL/hr over 120 Minutes Intravenous  Once 09/18/22 1433 09/18/22 1851   09/18/22 1430  vancomycin (VANCOCIN) IVPB 1000 mg/200 mL premix  Status:  Discontinued        1,000 mg 200 mL/hr over 60 Minutes Intravenous  Once 09/18/22 1426 09/18/22 1434   09/18/22 1430  ceFEPIme (MAXIPIME) 2 g in sodium chloride 0.9 % 100 mL IVPB        2 g 200 mL/hr over 30 Minutes Intravenous  Once 09/18/22 1426 09/18/22 1606       Subjective: Seen and examined at bedside and was feeling weak and wanted to walk.  Denied chest pain or shortness of breath.  Feels okay.  Denies any lightheadedness or dizziness.  No other concerns or complaints at this time.  Objective: Vitals:   09/23/22 2036 09/24/22 0340 09/24/22 1949 09/25/22 0449  BP: (!) 153/95 (!) 144/79 (!) 146/92 128/74  Pulse: (!) 113 84 86 64   Resp: 20 20 16 17   Temp: 98.7 F (37.1 C) 98 F (36.7 C) 98.6 F (37 C) 98 F (36.7 C)  TempSrc: Oral Oral Oral Oral  SpO2: 97% 91% 93% 92%  Weight:  107.3 kg  106.2 kg  Height:        Intake/Output Summary (Last 24 hours) at 09/25/2022 1138 Last data filed at 09/25/2022 1055 Gross per 24 hour  Intake 1190 ml  Output 2450 ml  Net -1260 ml   Filed Weights   09/22/22 0655 09/24/22 0340 09/25/22 0449  Weight: 110.7 kg 107.3 kg  106.2 kg   Examination: Physical Exam:  Constitutional: WN/WD morbidly obese Caucasian female in no acute distress appears little bit more comfortable Respiratory: Diminished to auscultation bilaterally with some coarse breath sounds and unlabored breathing, no wheezing, rales, rhonchi or crackles. Normal respiratory effort and patient is not tachypenic. No accessory muscle use.  Cardiovascular: RRR, no murmurs / rubs / gallops. S1 and S2 auscultated.  Has very mild lower extremity edema Abdomen: Soft, non-tender, distended secondary to body habitus. Bowel sounds positive.  GU: Deferred. Musculoskeletal: No clubbing / cyanosis of digits/nails. No joint deformity upper and lower extremities.  Skin: Has chronic lower extremity venous stasis changes no appreciable rash or lesions on limited skin evaluation. Neurologic: CN 2-12 grossly intact with no focal deficits.  Romberg sign and cerebellar reflexes not assessed.  Psychiatric: Normal judgment and insight. Alert and oriented x 3. Normal mood and appropriate affect.   Data Reviewed: I have personally reviewed following labs and imaging studies  CBC: Recent Labs  Lab 09/18/22 1519 09/19/22 0630 09/21/22 0436 09/22/22 0427 09/23/22 0407 09/24/22 0401 09/25/22 0424  WBC 13.3*   < > 9.3 9.4 8.8 8.0 8.4  NEUTROABS 10.8*  --   --   --   --   --  5.1  HGB 13.8   < > 12.5 12.5 12.8 13.0 13.5  HCT 42.3   < > 39.1 39.0 40.1 40.6 42.1  MCV 93.8   < > 94.0 94.2 93.3 93.8 94.4  PLT 147*   < > 147* 177 236 274  303   < > = values in this interval not displayed.   Basic Metabolic Panel: Recent Labs  Lab 09/21/22 0436 09/22/22 0427 09/23/22 0407 09/24/22 0401 09/25/22 0424  NA 136 132* 134* 134* 136  K 3.4* 4.0 3.5 3.4* 3.8  CL 99 96* 96* 97* 97*  CO2 28 29 29 30 29   GLUCOSE 130* 127* 121* 148* 115*  BUN 10 11 13 15 16   CREATININE 0.69 0.67 0.67 0.86 0.74  CALCIUM 8.2* 8.3* 8.4* 8.4* 8.6*  MG 2.5* 2.5* 2.0 2.0 2.0  PHOS  --   --   --   --  3.1   GFR: Estimated Creatinine Clearance: 62.2 mL/min (by C-G formula based on SCr of 0.74 mg/dL). Liver Function Tests: Recent Labs  Lab 09/18/22 1519 09/25/22 0424  AST 26 21  ALT 34 24  ALKPHOS 64 60  BILITOT 0.5 0.7  PROT 7.3 7.5  ALBUMIN 2.9* 2.8*   No results for input(s): "LIPASE", "AMYLASE" in the last 168 hours. No results for input(s): "AMMONIA" in the last 168 hours. Coagulation Profile: Recent Labs  Lab 09/21/22 0436 09/22/22 0427 09/23/22 0940 09/24/22 0401 09/25/22 0424  INR 2.4* 2.6* 2.4* 2.3* 2.4*   Cardiac Enzymes: No results for input(s): "CKTOTAL", "CKMB", "CKMBINDEX", "TROPONINI" in the last 168 hours. BNP (last 3 results) No results for input(s): "PROBNP" in the last 8760 hours. HbA1C: No results for input(s): "HGBA1C" in the last 72 hours. CBG: No results for input(s): "GLUCAP" in the last 168 hours. Lipid Profile: No results for input(s): "CHOL", "HDL", "LDLCALC", "TRIG", "CHOLHDL", "LDLDIRECT" in the last 72 hours. Thyroid Function Tests: No results for input(s): "TSH", "T4TOTAL", "FREET4", "T3FREE", "THYROIDAB" in the last 72 hours. Anemia Panel: No results for input(s): "VITAMINB12", "FOLATE", "FERRITIN", "TIBC", "IRON", "RETICCTPCT" in the last 72 hours. Sepsis Labs: Recent Labs  Lab 09/18/22 1519 09/19/22 0720 09/20/22 0529 09/21/22 0436 09/23/22 0407  PROCALCITON  --  0.73 0.33 0.22 <  0.10  LATICACIDVEN 1.3  --   --   --   --    Recent Results (from the past 240 hour(s))  Culture, blood  (routine x 2)     Status: None   Collection Time: 09/18/22  3:00 PM   Specimen: BLOOD LEFT HAND  Result Value Ref Range Status   Specimen Description   Final    BLOOD LEFT HAND Performed at [facility], [address], [place], [place] [address]    Special Requests   Final    BOTTLES DRAWN AEROBIC AND ANAEROBIC Blood Culture results may not be optimal due to an inadequate volume of blood received in culture bottles Performed at [facility], [address] [place], [place] [address]    Culture   Final    NO GROWTH 5 DAYS Performed at [facility], [address] [place], [place] [address]    Report Status 09/23/2022 FINAL  Final  Aerobic Culture w Gram Stain (superficial specimen)     Status: None   Collection Time: 09/18/22  4:00 PM   Specimen: Wound  Result Value Ref Range Status   Specimen Description   Final    WOUND SKIN, OTHER Performed at [facility], [address] [place], [place] [address]    Special Requests   Final    NONE Performed at [facility], [address], [place], [place] [address]    Gram Stain   Final    NO WBC SEEN NO ORGANISMS SEEN Performed at [facility], [address] [place], [place] [address]    Culture RARE PSEUDOMONAS AERUGINOSA  Final   Report Status 09/21/2022 FINAL  Final   Organism ID, Bacteria PSEUDOMONAS AERUGINOSA  Final      Susceptibility   Pseudomonas aeruginosa - MIC*    CEFTAZIDIME 2 SENSITIVE Sensitive     CIPROFLOXACIN <=0.25 SENSITIVE Sensitive     GENTAMICIN <=1 SENSITIVE Sensitive     IMIPENEM 2 SENSITIVE Sensitive     PIP/TAZO 8 SENSITIVE Sensitive     CEFEPIME 2 SENSITIVE Sensitive     * RARE PSEUDOMONAS AERUGINOSA  Culture, blood (Routine X 2) w Reflex to ID Panel     Status: None   Collection Time: 09/20/22  6:09 PM   Specimen: BLOOD RIGHT ARM  Result Value Ref Range Status   Specimen Description BLOOD RIGHT ARM  Final    Special Requests   Final    BOTTLES DRAWN AEROBIC AND ANAEROBIC Blood Culture adequate volume   Culture   Final    NO GROWTH 5 DAYS Performed at [facility], [address], [place], [place] [address]    Report Status 09/25/2022 FINAL  Final  Culture, blood (Routine X 2) w Reflex to ID Panel     Status: None   Collection Time: 09/20/22  6:09 PM   Specimen: BLOOD LEFT HAND  Result Value Ref Range Status   Specimen Description BLOOD LEFT HAND  Final   Special Requests   Final    BOTTLES DRAWN AEROBIC AND ANAEROBIC Blood Culture adequate volume   Culture   Final    NO GROWTH 5 DAYS Performed at [facility], [address] [place], [place] [address]    Report Status 09/25/2022 FINAL  Final    Radiology Studies: ECHOCARDIOGRAM LIMITED  Result Date: 09/24/2022    ECHOCARDIOGRAM LIMITED REPORT   Patient Name:   CHRISHELLE ZITO Kujawa  Date of Exam: 09/24/2022 Medical Rec #:  [id number]            Height:       64.0 in Accession #:    [id number]           Weight:       236.6 lb Date of Birth:  [birth year]            BSA:          2.101 m Patient Age:    82 years             BP:           144/79 mmHg Patient Gender: F                    HR:           88 bpm. Exam Location:  Inpatient Procedure: Limited Echo, Cardiac Doppler, Color Doppler and 2D Echo Indications:    A fib  History:        Patient has prior history of Echocardiogram examinations, most                 recent 09/19/2022. Arrythmias:Atrial Fibrillation.  Sonographer:    Lucy Antigua Referring Phys: [id number] Cyndi Bender IMPRESSIONS  1. Left ventricular ejection fraction, by estimation, is 55 to 60%. The left ventricle has normal function. The left ventricle has no regional wall motion abnormalities.  2. Right ventricular systolic function is normal. The right ventricular size is not well visualized.  3. Left atrial size was mildly dilated.  4. The mitral valve is normal in structure. No evidence of mitral stenosis.  5. Aortic valve  regurgitation is not visualized. Aortic valve sclerosis/calcification is present, without any evidence of aortic stenosis. Comparison(s): Prior images reviewed side by side. The left ventricular function has improved. No longer in atrial fibrillation. FINDINGS  Left Ventricle: Left ventricular ejection fraction, by estimation, is 55 to 60%. The left ventricle has normal function. The left ventricle has no regional wall motion abnormalities. The left ventricular internal cavity size was normal in size. There is  no left ventricular hypertrophy. Right Ventricle: The right ventricular size is not well visualized. Right ventricular systolic function is normal. Left Atrium: Left atrial size was mildly dilated. Right Atrium: Right atrial size was not well visualized. Pericardium: There is no evidence of pericardial effusion. Mitral Valve: The mitral valve is normal in structure. Mild mitral annular calcification. No evidence of mitral valve stenosis. Tricuspid Valve: The tricuspid valve is not well visualized. Aortic Valve: Aortic valve regurgitation is not visualized. Aortic valve sclerosis/calcification is present, without any evidence of aortic stenosis. Pulmonic Valve: The pulmonic valve was not well visualized. Aorta: The aortic root and ascending aorta are structurally normal, with no evidence of dilitation. IAS/Shunts: The interatrial septum was not well visualized. LEFT VENTRICLE PLAX 2D LVIDd:         4.90 cm LV PW:         1.20 cm LV IVS:        1.00 cm  Mihai Croitoru MD Electronically signed by Thurmon Fair MD Signature Date/Time: 09/24/2022/5:55:59 PM    Final     Scheduled Meds:  ciprofloxacin  500 mg Oral BID   diltiazem  360 mg Oral Daily   docusate sodium  100 mg Oral BID   [START ON 09/26/2022] furosemide  40 mg Oral Daily   [START ON 09/26/2022] irbesartan  300 mg Oral Daily   levothyroxine  100 mcg Oral Q0600   LORazepam  1.5 mg Oral QHS   nebivolol  5 mg Oral Daily   warfarin  5 mg Oral  q1600   Warfarin - Pharmacist Dosing Inpatient   Does not apply q1600   Continuous Infusions:   LOS: 7 days   Marguerita Merles, DO [facility] Available via Epic secure chat 7am-7pm After these hours, please refer to coverage provider listed on [email or web address] 09/25/2022, 11:38 AM

## 2022-09-25 NOTE — Progress Notes (Signed)
ANTICOAGULATION CONSULT NOTE - Follow Up Consult  Pharmacy Consult for warfarin Indication: hx atrial fibrillation, pulmonary embolus, and DVT  Allergies  Allergen Reactions   Codeine Other (See Comments)    Caused Hyperactivity and Dysphoria   Erythromycin Itching, Anxiety and Other (See Comments)    Also with jitters   Tetracyclines & Related Itching    Also with jitters   Statins Other (See Comments)    Leg muscle weakness   Atenolol Other (See Comments)    Depression    Citalopram Hydrobromide Nausea Only and Other (See Comments)    Nausea/Fatigue   Clindamycin/Lincomycin Other (See Comments)    Via IV, has caused a metallic taste in the mouth    Coenzyme Q10 Itching   Losartan Potassium Other (See Comments)    Muscle aches    Motrin [Ibuprofen] Other (See Comments)    Is to not take this   Rosuvastatin Other (See Comments)    Myalgias   Simvastatin Other (See Comments)    Myalgias   Tape     PAPER TAPE IS PPEFERRED   Latex Rash    Patient Measurements: Height:  (162.6 cm) Weight: 106.2 kg (234 lb 2.1 oz) IBW/kg (Calculated) : 54.7  Vital Signs: Temp: 98 F (36.7 C) (04/18 0449) Temp Source: Oral (04/18 0449) BP: 128/74 (04/18 0449) Pulse Rate: 64 (04/18 0449)  Labs: Recent Labs    09/23/22 0407 09/23/22 0940 09/24/22 0401 09/25/22 0424  HGB 12.8  --  13.0 13.5  HCT 40.1  --  40.6 42.1  PLT 236  --  274 303  LABPROT  --  25.9* 25.1* 25.9*  INR  --  2.4* 2.3* 2.4*  CREATININE 0.67  --  0.86 0.74     Estimated Creatinine Clearance: 62.2 mL/min (by C-G formula based on SCr of 0.74 mg/dL).   Medications:  - PTA warfarin regimen: 5 mg daily   Assessment: Patient's an 82 y.o F with hx afib, PE/DVT on warfarin PTA, who presented to the ED on 09/18/22 with c/o right leg swelling and redness. Warfarin resumed on admission and she's currently on abx for cellulitis.  Today, 09/25/2022:  - INR = 2.4, remains therapeutic   - CBC WNL - no bleeding  documented - drug-drug intxns: being on abx (linezolid and cipro) can make pt more sensitive to warfarin  Goal of Therapy:  INR 2-3 Monitor platelets by anticoagulation protocol: Yes   Plan:  - Warfarin 5 mg PO daily - change daily INR to TTS INR - Monitor for s/sx bleeding  Herby Abraham, Pharm.D Use secure chat for questions 09/25/2022 9:01 AM

## 2022-09-25 NOTE — Progress Notes (Signed)
Mobility Specialist - Progress Note   09/25/22 1041  Mobility  Activity Ambulated with assistance in hallway;Transferred to/from [facility]  Level of Assistance Modified independent, requires aide device or extra time  Assistive Device Front wheel walker  Distance Ambulated (ft) 350 ft  Activity Response Tolerated well  Mobility Referral Yes  $Mobility charge 1 Mobility   Pt received in bed and agreeable to mobility. Prior to ambulating pt requested assistance to [facility]. After ambulating ~275 pt took a standing rest break. O2 checked & at 85%. Encouraged pursed lip breaths bringing O2 back up to 92%. No complaints during session. Pt to bed after session with all needs met & call bell in reach.   During mobility: 81 HR, 85% SpO2 Post-mobility: 91% SPO2  Chief Technology Officer

## 2022-09-25 NOTE — Progress Notes (Signed)
Physical Therapy Treatment and Discharge from Acute PT Patient Details Name: [name] MRN: [id number] DOB: [birth year] Today's Date: 09/25/2022   History of Present Illness 82 y.o. female with past medical history significant for DVT/PE on Coumadin, HTN, hypothyroidism, OSA on nocturnal CPAP, obesity, recent right lower extremity cellulitis treated with outpatient clindamycin and pt admitted 09/18/22 for recurrent right LE cellulitis and new diagnosis of systolic congestive heart failure    PT Comments    Pt ambulating modified independently.  Pt continues to require supplemental oxygen during exertional tasks.  Pt agreeable to no further PT needs at this time.  PT to sign off.  SATURATION QUALIFICATIONS: (This note is used to comply with regulatory documentation for home oxygen)  Patient Saturations on Room Air at Rest = 93%  Patient Saturations on Room Air while Ambulating = 86%  Patient Saturations on 2 Liters of oxygen while Ambulating = 88-91%  Please briefly explain why patient needs home oxygen: to improve oxygen saturations during physical activities such as ADLs and ambulation.   Recommendations for follow up therapy are one component of a multi-disciplinary discharge planning process, led by the attending physician.  Recommendations may be updated based on patient status, additional functional criteria and insurance authorization.  Follow Up Recommendations       Assistance Recommended at Discharge PRN  Patient can return home with the following Assistance with cooking/housework;Assist for transportation;Help with stairs or ramp for entrance   Equipment Recommendations  None recommended by PT    Recommendations for Other Services       Precautions / Restrictions Precautions Precautions: Other (comment) Precaution Comments: monitor O2     Mobility  Bed Mobility                    Transfers Overall transfer level: Modified independent                  General transfer comment: using BSC in room    Ambulation/Gait Ambulation/Gait assistance: Supervision, Modified independent (Device/Increase time) Gait Distance (Feet): 400 Feet Assistive device: Rolling walker (2 wheels) Gait Pattern/deviations: Step-through pattern, Decreased stride length       General Gait Details: steady, no loss of balance, SpO2 86% on room air ambulating and improved to 88-91% on 2L O2 [name]   Stairs             Wheelchair Mobility    Modified Rankin (Stroke Patients Only)       Balance                                            Cognition Arousal/Alertness: Awake/alert Behavior During Therapy: WFL for tasks assessed/performed Overall Cognitive Status: Within Functional Limits for tasks assessed                                          Exercises      General Comments        Pertinent Vitals/Pain Pain Assessment Pain Assessment: No/denies pain    Home Living                          Prior Function            PT Goals (current goals  can now be found in the care plan section) Progress towards PT goals: Progressing toward goals    Frequency    Min 1X/week      PT Plan Current plan remains appropriate    Co-evaluation              AM-PAC PT "6 Clicks" Mobility   Outcome Measure  Help needed turning from your back to your side while in a flat bed without using bedrails?: None Help needed moving from lying on your back to sitting on the side of a flat bed without using bedrails?: None Help needed moving to and from a bed to a chair (including a wheelchair)?: None Help needed standing up from a chair using your arms (e.g., wheelchair or bedside chair)?: None Help needed to walk in hospital room?: None Help needed climbing 3-5 steps with a railing? : A Little 6 Click Score: 23    End of Session Equipment Utilized During Treatment: Oxygen Activity  Tolerance: Patient tolerated treatment well Patient left: in chair;with call bell/phone within reach;with family/visitor present Nurse Communication: Mobility status PT Visit Diagnosis: Difficulty in walking, not elsewhere classified (R26.2)     Time: 1350-1405 PT Time Calculation (min) (ACUTE ONLY): 15 min  Charges:  $Gait Training: 8-22 mins                    [name], DPT Physical Therapist Acute Rehabilitation Services Office: [phone]    [name] 09/25/2022, 4:12 PM

## 2022-09-25 NOTE — Progress Notes (Signed)
Rounding Note    Patient Name: [name] Date of Encounter: 09/25/2022  [facility] Cardiologist: Lance Muss, MD   Subjective   She is feeling much improved today, states she had slept well last night, seems to bystolic . Her leg edema has improved. She states she walked some distance yesterday and feels good. She is wondering if we should go up 2.5mg  at a time on bystolic. She expressed concern of side effects from beta blocker and mentioned her out of body experience.   Inpatient Medications    Scheduled Meds:  ciprofloxacin  500 mg Oral BID   docusate sodium  100 mg Oral BID   furosemide  40 mg Intravenous BID   levothyroxine  100 mcg Oral Q0600   LORazepam  1.5 mg Oral QHS   nebivolol  10 mg Oral Daily   sacubitril-valsartan  1 tablet Oral BID   warfarin  5 mg Oral q1600   Warfarin - Pharmacist Dosing Inpatient   Does not apply q1600   Continuous Infusions:  PRN Meds: acetaminophen **OR** acetaminophen, albuterol, benzocaine, calcium carbonate, LORazepam, ondansetron **OR** ondansetron (ZOFRAN) IV, mouth rinse, oxyCODONE, phenol, polyethylene glycol   Vital Signs    Vitals:   09/23/22 2036 09/24/22 0340 09/24/22 1949 09/25/22 0449  BP: (!) 153/95 (!) 144/79 (!) 146/92 128/74  Pulse: (!) 113 84 86 64  Resp: Temp: 98.7 F (37.1 C) 98 F (36.7 C) 98.6 F (37 C) 98 F (36.7 C)  TempSrc: Oral Oral Oral Oral  SpO2: 97% 91% 93% 92%  Weight:  107.3 kg  106.2 kg  Height:        Intake/Output Summary (Last 24 hours) at 09/25/2022 0916 Last data filed at 09/25/2022 0800 Gross per 24 hour  Intake 1070 ml  Output 2800 ml  Net -1730 ml      09/25/2022    4:49 AM 09/24/2022    3:40 AM 09/22/2022    6:55 AM  Last 3 Weights  Weight (lbs) 234 lb 2.1 oz 236 lb 8.9 oz 244 lb 0.8 oz  Weight (kg) 106.2 kg 107.3 kg 110.7 kg      Telemetry    A fib with ventricular rate mid 100s - Personally Reviewed  ECG    N/a today -  Personally Reviewed  Physical Exam   GEN: No acute distress.   Neck: Thick neck, difficult assess JVD  Cardiac: Irregularly irregular, no murmurs, rubs, or gallops.  Respiratory: Clear to auscultation bilaterally. On room air. Speaks full sentence.  GI: Soft, nontender,abdomen obese  MS: Able to move all extremities in bed without difficulty, BLE with chronic venous stasis change, trace edema of BLE,  erythema remains bilaterally, improving tenderness  Neuro:  Nonfocal  Psych: Normal affect   Labs    High Sensitivity Troponin:  No results for input(s): "TROPONINIHS" in the last 720 hours.   Chemistry Recent Labs  Lab 09/18/22 1519 09/19/22 0630 09/23/22 0407 09/24/22 0401 09/25/22 0424  NA 136   < > 134* 134* 136  K 3.4*   < > 3.5 3.4* 3.8  CL 88*   < > 96* 97* 97*  CO2 29   < > GLUCOSE 133*   < > 121* 148* 115*  BUN 25*   < > CREATININE 0.89   < > 0.67 0.86 0.74  CALCIUM 9.1   < > 8.4* 8.4* 8.6*  MG  --    < >  2.0 2.0 2.0  PROT 7.3  --   --   --  7.5  ALBUMIN 2.9*  --   --   --  2.8*  AST 26  --   --   --  21  ALT 34  --   --   --  24  ALKPHOS 64  --   --   --  60  BILITOT 0.5  --   --   --  0.7  GFRNONAA >60   < > >60 >60 >60  ANIONGAP 19*   < > 9 7 10    < > = values in this interval not displayed.    Lipids No results for input(s): "CHOL", "TRIG", "HDL", "LABVLDL", "LDLCALC", "CHOLHDL" in the last 168 hours.  Hematology Recent Labs  Lab 09/23/22 0407 09/24/22 0401 09/25/22 0424  WBC 8.8 8.0 8.4  RBC 4.30 4.33 4.46  HGB 12.8 13.0 13.5  HCT 40.1 40.6 42.1  MCV 93.3 93.8 94.4  MCH 29.8 30.0 30.3  MCHC 31.9 32.0 32.1  RDW 15.6* 15.5 15.4  PLT 236 274 303   Thyroid  Recent Labs  Lab 09/21/22 0436  TSH 2.282    BNP Recent Labs  Lab 09/22/22 0427 09/23/22 0407 09/24/22 0401  BNP 129.9* 162.3* 186.1*    DDimer No results for input(s): "DDIMER" in the last 168 hours.   Radiology    ECHOCARDIOGRAM LIMITED  Result Date:  09/24/2022    ECHOCARDIOGRAM LIMITED REPORT   Patient Name:   [name] Madonna Date of Exam: 09/24/2022 Medical Rec #:  [id number]            Height:       64.0 in Accession #:    [id number]           Weight:       236.6 lb Date of Birth:  Apr 17, 1941            BSA:          2.101 m Patient Age:    82 years             BP:           144/79 mmHg Patient Gender: F                    HR:           88 bpm. Exam Location:  Inpatient Procedure: Limited Echo, Cardiac Doppler, Color Doppler and 2D Echo Indications:    A fib  History:        Patient has prior history of Echocardiogram examinations, most                 recent 09/19/2022. Arrythmias:Atrial Fibrillation.  Sonographer:    Lucy Antigua Referring Phys: [id number] Cyndi Bender IMPRESSIONS  1. Left ventricular ejection fraction, by estimation, is 55 to 60%. The left ventricle has normal function. The left ventricle has no regional wall motion abnormalities.  2. Right ventricular systolic function is normal. The right ventricular size is not well visualized.  3. Left atrial size was mildly dilated.  4. The mitral valve is normal in structure. No evidence of mitral stenosis.  5. Aortic valve regurgitation is not visualized. Aortic valve sclerosis/calcification is present, without any evidence of aortic stenosis. Comparison(s): Prior images reviewed side by side. The left ventricular function has improved. No longer in atrial fibrillation. FINDINGS  Left Ventricle: Left ventricular ejection fraction, by estimation, is 55 to 60%. The left ventricle  has normal function. The left ventricle has no regional wall motion abnormalities. The left ventricular internal cavity size was normal in size. There is  no left ventricular hypertrophy. Right Ventricle: The right ventricular size is not well visualized. Right ventricular systolic function is normal. Left Atrium: Left atrial size was mildly dilated. Right Atrium: Right atrial size was not well visualized. Pericardium: There is  no evidence of pericardial effusion. Mitral Valve: The mitral valve is normal in structure. Mild mitral annular calcification. No evidence of mitral valve stenosis. Tricuspid Valve: The tricuspid valve is not well visualized. Aortic Valve: Aortic valve regurgitation is not visualized. Aortic valve sclerosis/calcification is present, without any evidence of aortic stenosis. Pulmonic Valve: The pulmonic valve was not well visualized. Aorta: The aortic root and ascending aorta are structurally normal, with no evidence of dilitation. IAS/Shunts: The interatrial septum was not well visualized. LEFT VENTRICLE PLAX 2D LVIDd:         4.90 cm LV PW:         1.20 cm LV IVS:        1.00 cm  Thurmon Fair MD Electronically signed by Thurmon Fair MD Signature Date/Time: 09/24/2022/5:55:59 PM    Final     Cardiac Studies   Limited Echo 09/24/22:   1. Left ventricular ejection fraction, by estimation, is 55 to 60%. The  left ventricle has normal function. The left ventricle has no regional  wall motion abnormalities.   2. Right ventricular systolic function is normal. The right ventricular  size is not well visualized.   3. Left atrial size was mildly dilated.   4. The mitral valve is normal in structure. No evidence of mitral  stenosis.   5. Aortic valve regurgitation is not visualized. Aortic valve  sclerosis/calcification is present, without any evidence of aortic  stenosis.   Comparison(s): Prior images reviewed side by side. The left ventricular  function has improved. No longer in atrial fibrillation.    Echo from 09/20/22:   1. Poor echo windows   2. Challenging study despite using Definity contrast. Tachycardia and  poor echo windows limit accurate assessment of LVEF. Left ventricular  ejection fraction, by estimation, is 40 to 50%. The left ventricle has  mildly decreased function. Left  ventricular endocardial border not optimally defined to evaluate regional  wall motion despite using  Definity contrast. Recommend obtaining limited  echo with contrast when tachycardia is resolved. There is mild left  ventricular hypertrophy. Indeterminate  diastolic filling due to E-A fusion.   3. Right ventricular systolic function is mildly reduced. The right  ventricular size is moderately enlarged. Tricuspid regurgitation signal is  inadequate for assessing PA pressure.   4. The mitral valve was not well visualized. No evidence of mitral valve  regurgitation. No evidence of mitral stenosis.   5. The aortic valve is tricuspid. Aortic valve regurgitation is not  visualized. No aortic stenosis is present.   Comparison(s): No prior Echocardiogram.      Patient Profile     ANGALA HILGERS is a 82 y.o. female with a hx of permanent A fib, hx of PE/DVT, OSA on CPAP, HLD, obesity, HTN, anxiety, chronic leg edema,  cardiology is following for CHF and A fib RVR.   Assessment & Plan    Permanent atrial fibrillation with RVR - TSH WNL from 09/21/22 - likely mediated by acute cellulitis, hypokalemia - keep K >4 and Mag >2 please  - on PTA cardizem , intolerant to metoprolol  BID (started this  admission) due to sleep disturbance and depression - discontinued Cardizem due to reduced EF on Echo 4/13 and stopped metoprolol, started bystolic which she is tolerating so far, will increased to 10mg  daily today for rate control; she does not want digoxin or amiodarone due to worry of side effects, dofetilide is a possibility  - continue anticoagulation with coumadin    New onset dilated cardiomyopathy  Acute systolic heart failure  - presented with chronic BLE edema that worsened in the setting of cellulitis, she denied any historical dyspnea, orthopnea, PND - BNP 279, POA - CXR with Mild patchy bilateral lower lobe opacities, raising concern for infection/pneumonia, less likely atelectasis, POA  - Initial Echo on 09/20/22 with LVEF 40-50% (EF normal in 2013), mild LVH, mild reduce  RV, mod RV enlargement, image was poor  - Repeated limited Echo 09/25/22 with controlled Afib: LVEF 55-60%, no RWMA, normal RV, mild LAE, The left ventricular function has improved.  - likely tachycardia mediated CM - s/p IV Lasix 40mg  BID, Net -4.6L and weight down from 244>234ibs since admission, leg edema is improving with diuresis and antibiotic, would transtion to PO Lasix 40mg  daily today, not on PTA Lasix, would stop PTA HCTZ - GDMT: started bystolic 10mg /tolerating, discontinued PTA Cardizem 360mg  as well as irbesartan/HCTZ, started Entresto 24/26mg  BID, hope to add spironolactone and SGLT2I but she is worried about too many medication and side effects/will defer to follow up visit    HTN - BP improving on lasix, bystolic, and entresto    HLD RLE cellulitis  Hypokalemia  Hypothyroidism  OSA Obesity  Anxiety  - per primary team       For questions or updates, please contact [facility] Please consult [email or web address] for contact info under        Signed, Cyndi Bender, NP  09/25/2022, 9:16 AM

## 2022-09-26 ENCOUNTER — Inpatient Hospital Stay (HOSPITAL_COMMUNITY): Payer: Medicare HMO

## 2022-09-26 DIAGNOSIS — I4821 Permanent atrial fibrillation: Secondary | ICD-10-CM | POA: Diagnosis not present

## 2022-09-26 DIAGNOSIS — I5031 Acute diastolic (congestive) heart failure: Secondary | ICD-10-CM | POA: Diagnosis not present

## 2022-09-26 DIAGNOSIS — I5033 Acute on chronic diastolic (congestive) heart failure: Secondary | ICD-10-CM

## 2022-09-26 DIAGNOSIS — I5030 Unspecified diastolic (congestive) heart failure: Secondary | ICD-10-CM

## 2022-09-26 DIAGNOSIS — I10 Essential (primary) hypertension: Secondary | ICD-10-CM | POA: Diagnosis not present

## 2022-09-26 DIAGNOSIS — Z6841 Body Mass Index (BMI) 40.0 and over, adult: Secondary | ICD-10-CM | POA: Diagnosis not present

## 2022-09-26 DIAGNOSIS — R0602 Shortness of breath: Secondary | ICD-10-CM | POA: Diagnosis not present

## 2022-09-26 DIAGNOSIS — L03115 Cellulitis of right lower limb: Secondary | ICD-10-CM | POA: Diagnosis not present

## 2022-09-26 DIAGNOSIS — L0390 Cellulitis, unspecified: Secondary | ICD-10-CM | POA: Diagnosis not present

## 2022-09-26 DIAGNOSIS — A419 Sepsis, unspecified organism: Secondary | ICD-10-CM | POA: Diagnosis not present

## 2022-09-26 LAB — COMPREHENSIVE METABOLIC PANEL
ALT: 25 U/L (ref 0–44)
AST: 31 U/L (ref 15–41)
Albumin: 2.8 g/dL — ABNORMAL LOW (ref 3.5–5.0)
Alkaline Phosphatase: 59 U/L (ref 38–126)
Anion gap: 8 (ref 5–15)
BUN: 22 mg/dL (ref 8–23)
CO2: 30 mmol/L (ref 22–32)
Calcium: 8.9 mg/dL (ref 8.9–10.3)
Chloride: 97 mmol/L — ABNORMAL LOW (ref 98–111)
Creatinine, Ser: 0.88 mg/dL (ref 0.44–1.00)
GFR, Estimated: >60 mL/min (ref >60)
Glucose, Bld: 117 mg/dL — ABNORMAL HIGH (ref 70–99)
Potassium: 4 mmol/L (ref 3.5–5.1)
Sodium: 135 mmol/L (ref 135–145)
Total Bilirubin: 1.2 mg/dL (ref 0.3–1.2)
Total Protein: 7.7 g/dL (ref 6.5–8.1)

## 2022-09-26 LAB — CBC WITH DIFFERENTIAL/PLATELET
Abs Immature Granulocytes: 0.11 10*3/uL — ABNORMAL HIGH (ref 0.00–0.07)
Basophils Absolute: 0.1 10*3/uL (ref 0.0–0.1)
Basophils Relative: 1 %
Eosinophils Absolute: 0.2 10*3/uL (ref 0.0–0.5)
Eosinophils Relative: 2 %
HCT: 45.3 % (ref 36.0–46.0)
Hemoglobin: 14.5 g/dL (ref 12.0–15.0)
Immature Granulocytes: 1 %
Lymphocytes Relative: 21 %
Lymphs Abs: 1.9 10*3/uL (ref 0.7–4.0)
MCH: 30.3 pg (ref 26.0–34.0)
MCHC: 32 g/dL (ref 30.0–36.0)
MCV: 94.6 fL (ref 80.0–100.0)
Monocytes Absolute: 1.1 10*3/uL — ABNORMAL HIGH (ref 0.1–1.0)
Monocytes Relative: 12 %
Neutro Abs: 5.6 10*3/uL (ref 1.7–7.7)
Neutrophils Relative %: 63 %
Platelets: 311 10*3/uL (ref 150–400)
RBC: 4.79 MIL/uL (ref 3.87–5.11)
RDW: 15.3 % (ref 11.5–15.5)
WBC: 8.9 10*3/uL (ref 4.0–10.5)
nRBC: 0 % (ref 0.0–0.2)

## 2022-09-26 LAB — SEDIMENTATION RATE: Sed Rate: 101 mm/hr — ABNORMAL HIGH (ref 0–22)

## 2022-09-26 LAB — PROTIME-INR
INR: 2.3 — ABNORMAL HIGH (ref 0.8–1.2)
Prothrombin Time: 25.1 seconds — ABNORMAL HIGH (ref 11.4–15.2)

## 2022-09-26 LAB — MAGNESIUM: Magnesium: 2.2 mg/dL (ref 1.7–2.4)

## 2022-09-26 LAB — BRAIN NATRIURETIC PEPTIDE: B Natriuretic Peptide: 184.5 pg/mL — ABNORMAL HIGH (ref 0.0–100.0)

## 2022-09-26 LAB — PHOSPHORUS: Phosphorus: 3.9 mg/dL (ref 2.5–4.6)

## 2022-09-26 MED ORDER — OXYCODONE HCL 5 MG PO TABS: 5.0000 mg | ORAL_TABLET | Freq: Four times a day (QID) | ORAL | 0 refills | Status: DC | PRN

## 2022-09-26 MED ORDER — BENZOCAINE 10 % MT GEL: Freq: Three times a day (TID) | OROMUCOSAL | 0 refills | Status: DC | PRN

## 2022-09-26 MED ORDER — FUROSEMIDE 40 MG PO TABS: 40.0000 mg | ORAL_TABLET | Freq: Every day | ORAL | 0 refills | Status: DC

## 2022-09-26 MED ORDER — POLYETHYLENE GLYCOL 3350 17 G PO PACK: 17.0000 g | PACK | Freq: Every day | ORAL | 0 refills | Status: DC | PRN

## 2022-09-26 MED ORDER — DOCUSATE SODIUM 100 MG PO CAPS: 100.0000 mg | ORAL_CAPSULE | Freq: Two times a day (BID) | ORAL | 0 refills | Status: DC

## 2022-09-26 MED ORDER — ONDANSETRON HCL 4 MG PO TABS: 4.0000 mg | ORAL_TABLET | Freq: Four times a day (QID) | ORAL | 0 refills | Status: DC | PRN

## 2022-09-26 MED ORDER — NEBIVOLOL HCL 5 MG PO TABS: 5.0000 mg | ORAL_TABLET | Freq: Every day | ORAL | 0 refills | Status: DC

## 2022-09-26 MED ORDER — CIPROFLOXACIN HCL 500 MG PO TABS: 500.0000 mg | ORAL_TABLET | Freq: Two times a day (BID) | ORAL | 0 refills | Status: AC

## 2022-09-26 MED ORDER — IRBESARTAN 300 MG PO TABS: 300.0000 mg | ORAL_TABLET | Freq: Every day | ORAL | 0 refills | Status: DC

## 2022-09-26 NOTE — Plan of Care (Signed)
Patient awake alert orientatedx4. Denies pain. Remains on RA. Patient stable for discharge per MD order. Spouse at bedside. No signs or symptoms of acute distress at the tine of discharge. Patient verbalized understanding of discharge instructions. Problem: Education: Goal: Knowledge of General Education information will improve Description: Including pain rating scale, medication(s)/side effects and non-pharmacologic comfort measures Outcome: Adequate for Discharge   Problem: Health Behavior/Discharge Planning: Goal: Ability to manage health-related needs will improve Outcome: Adequate for Discharge   Problem: Clinical Measurements: Goal: Ability to maintain clinical measurements within normal limits will improve Outcome: Adequate for Discharge Goal: Will remain free from infection Outcome: Adequate for Discharge Goal: Diagnostic test results will improve Outcome: Adequate for Discharge Goal: Respiratory complications will improve Outcome: Adequate for Discharge Goal: Cardiovascular complication will be avoided Outcome: Adequate for Discharge   Problem: Activity: Goal: Risk for activity intolerance will decrease Outcome: Adequate for Discharge   Problem: Nutrition: Goal: Adequate nutrition will be maintained Outcome: Adequate for Discharge   Problem: Coping: Goal: Level of anxiety will decrease Outcome: Adequate for Discharge   Problem: Elimination: Goal: Will not experience complications related to bowel motility Outcome: Adequate for Discharge Goal: Will not experience complications related to urinary retention Outcome: Adequate for Discharge   Problem: Pain Managment: Goal: General experience of comfort will improve Outcome: Adequate for Discharge   Problem: Safety: Goal: Ability to remain free from injury will improve Outcome: Adequate for Discharge   Problem: Skin Integrity: Goal: Risk for impaired skin integrity will decrease Outcome: Adequate for Discharge

## 2022-09-26 NOTE — Care Management Important Message (Signed)
Important Message  Patient Details IM Letter given Name: [name] MRN: [id number] Date of Birth: 12/11/1940   Medicare Important Message Given:  Yes     Caren Macadam 09/26/2022, 10:00 AM

## 2022-09-26 NOTE — Progress Notes (Signed)
Rounding Note    Patient Name: [name] Date of Encounter: 09/26/2022  [facility] Cardiologist: Lance Muss, MD   Subjective   Denies any chest pain or shortness of breath.  Feeling good today.   Inpatient Medications    Scheduled Meds:  ciprofloxacin  500 mg Oral BID   diltiazem  360 mg Oral Daily   docusate sodium  100 mg Oral BID   furosemide  40 mg Oral Daily   irbesartan  300 mg Oral Daily   levothyroxine  100 mcg Oral Q0600   LORazepam  1.5 mg Oral QHS   nebivolol  5 mg Oral Daily   warfarin  5 mg Oral q1600   Warfarin - Pharmacist Dosing Inpatient   Does not apply q1600   Continuous Infusions:  PRN Meds: acetaminophen **OR** acetaminophen, albuterol, benzocaine, calcium carbonate, LORazepam, ondansetron **OR** ondansetron (ZOFRAN) IV, mouth rinse, oxyCODONE, phenol, polyethylene glycol   Vital Signs    Vitals:   09/25/22 0449 09/25/22 1454 09/25/22 2147 09/26/22 0408  BP: 128/74 124/85 139/72 134/74  Pulse: 64 (!) 45 86 94  Resp: 17 20 (!) 22 (!) 21  Temp: 98 F (36.7 C) 98.6 F (37 C) 98.3 F (36.8 C) 97.8 F (36.6 C)  TempSrc: Oral Oral Oral Oral  SpO2: 92% (!) 86% 95% 90%  Weight: 106.2 kg   103.8 kg  Height:        Intake/Output Summary (Last 24 hours) at 09/26/2022 0930 Last data filed at 09/26/2022 0600 Gross per 24 hour  Intake 480 ml  Output 825 ml  Net -345 ml       09/26/2022    4:08 AM 09/25/2022    4:49 AM 09/24/2022    3:40 AM  Last 3 Weights  Weight (lbs) 228 lb 12.8 oz 234 lb 2.1 oz 236 lb 8.9 oz  Weight (kg) 103.783 kg 106.2 kg 107.3 kg      Telemetry    Atrial fibrillation in the 90s- Personally Reviewed  ECG    N/a today - Personally Reviewed  Physical Exam   GEN: Well nourished, well developed in no acute distress HEENT: Normal NECK: No JVD; No carotid bruits LYMPHATICS: No lymphadenopathy CARDIAC: Irregularly irregular, no murmurs, rubs, gallops RESPIRATORY:  Clear to auscultation  without rales, wheezing or rhonchi  ABDOMEN: Soft, non-tender, non-distended MUSCULOSKELETAL: Chronic brawny changes of chronic edema with some mild erythema on both legs; No deformity  SKIN: Warm and dry NEUROLOGIC:  Alert and oriented x 3 PSYCHIATRIC:  Normal affect  Labs    High Sensitivity Troponin:  No results for input(s): "TROPONINIHS" in the last 720 hours.   Chemistry Recent Labs  Lab 09/24/22 0401 09/25/22 0424 09/26/22 0358  NA 134* 136 135  K 3.4* 3.8 4.0  CL 97* 97* 97*  CO2 GLUCOSE 148* 115* 117*  BUN CREATININE 0.86 0.74 0.88  CALCIUM 8.4* 8.6* 8.9  MG 2.0 2.0 2.2  PROT  --  7.5 7.7  ALBUMIN  --  2.8* 2.8*  AST  --  21 31  ALT  --  24 25  ALKPHOS  --  60 59  BILITOT  --  0.7 1.2  GFRNONAA >60 >60 >60  ANIONGAP Lipids No results for input(s): "CHOL", "TRIG", "HDL", "LABVLDL", "LDLCALC", "CHOLHDL" in the last 168 hours.  Hematology Recent Labs  Lab 09/24/22 0401 09/25/22 0424 09/26/22 0358  WBC 8.0 8.4  8.9  RBC 4.33 4.46 4.79  HGB 13.0 13.5 14.5  HCT 40.6 42.1 45.3  MCV 93.8 94.4 94.6  MCH 30.0 30.3 30.3  MCHC 32.0 32.1 32.0  RDW 15.5 15.4 15.3  PLT 274 303 311    Thyroid  Recent Labs  Lab 09/21/22 0436  TSH 2.282     BNP Recent Labs  Lab 09/22/22 0427 09/23/22 0407 09/24/22 0401  BNP 129.9* 162.3* 186.1*     DDimer No results for input(s): "DDIMER" in the last 168 hours.   Radiology    DG CHEST PORT 1 VIEW  Result Date: 09/26/2022 CLINICAL DATA:  Shortness of breath. EXAM: PORTABLE CHEST 1 VIEW COMPARISON:  09/20/2022. FINDINGS: Interval improvement in patchy bilateral lower lung airspace opacities. No consolidation. No pleural effusion or pneumothorax. Stable cardiac and mediastinal contours. IMPRESSION: Interval improvement in patchy bilateral lower lung airspace opacities. No consolidation. Electronically Signed   By: Orvan Falconer M.D.   On: 09/26/2022 08:41   ECHOCARDIOGRAM  LIMITED  Result Date: 09/24/2022    ECHOCARDIOGRAM LIMITED REPORT   Patient Name:   [name] Date of Exam: 09/24/2022 Medical Rec #:  [id number]            Height:       64.0 in Accession #:    [id number]           Weight:       236.6 lb Date of Birth:  01/29/1941            BSA:          2.101 m Patient Age:    82 years             BP:           144/79 mmHg Patient Gender: F                    HR:           88 bpm. Exam Location:  Inpatient Procedure: Limited Echo, Cardiac Doppler, Color Doppler and 2D Echo Indications:    A fib  History:        Patient has prior history of Echocardiogram examinations, most                 recent 09/19/2022. Arrythmias:Atrial Fibrillation.  Sonographer:    Lucy Antigua Referring Phys: [id number] Cyndi Bender IMPRESSIONS  1. Left ventricular ejection fraction, by estimation, is 55 to 60%. The left ventricle has normal function. The left ventricle has no regional wall motion abnormalities.  2. Right ventricular systolic function is normal. The right ventricular size is not well visualized.  3. Left atrial size was mildly dilated.  4. The mitral valve is normal in structure. No evidence of mitral stenosis.  5. Aortic valve regurgitation is not visualized. Aortic valve sclerosis/calcification is present, without any evidence of aortic stenosis. Comparison(s): Prior images reviewed side by side. The left ventricular function has improved. No longer in atrial fibrillation. FINDINGS  Left Ventricle: Left ventricular ejection fraction, by estimation, is 55 to 60%. The left ventricle has normal function. The left ventricle has no regional wall motion abnormalities. The left ventricular internal cavity size was normal in size. There is  no left ventricular hypertrophy. Right Ventricle: The right ventricular size is not well visualized. Right ventricular systolic function is normal. Left Atrium: Left atrial size was mildly dilated. Right Atrium: Right atrial size was not well visualized.  Pericardium: There is no evidence of pericardial effusion.  Mitral Valve: The mitral valve is normal in structure. Mild mitral annular calcification. No evidence of mitral valve stenosis. Tricuspid Valve: The tricuspid valve is not well visualized. Aortic Valve: Aortic valve regurgitation is not visualized. Aortic valve sclerosis/calcification is present, without any evidence of aortic stenosis. Pulmonic Valve: The pulmonic valve was not well visualized. Aorta: The aortic root and ascending aorta are structurally normal, with no evidence of dilitation. IAS/Shunts: The interatrial septum was not well visualized. LEFT VENTRICLE PLAX 2D LVIDd:         4.90 cm LV PW:         1.20 cm LV IVS:        1.00 cm  Thurmon Fair MD Electronically signed by Thurmon Fair MD Signature Date/Time: 09/24/2022/5:55:59 PM    Final     Cardiac Studies   Limited Echo 09/24/22:   1. Left ventricular ejection fraction, by estimation, is 55 to 60%. The  left ventricle has normal function. The left ventricle has no regional  wall motion abnormalities.   2. Right ventricular systolic function is normal. The right ventricular  size is not well visualized.   3. Left atrial size was mildly dilated.   4. The mitral valve is normal in structure. No evidence of mitral  stenosis.   5. Aortic valve regurgitation is not visualized. Aortic valve  sclerosis/calcification is present, without any evidence of aortic  stenosis.   Comparison(s): Prior images reviewed side by side. The left ventricular  function has improved. No longer in atrial fibrillation.    Echo from 09/20/22:   1. Poor echo windows   2. Challenging study despite using Definity contrast. Tachycardia and  poor echo windows limit accurate assessment of LVEF. Left ventricular  ejection fraction, by estimation, is 40 to 50%. The left ventricle has  mildly decreased function. Left  ventricular endocardial border not optimally defined to evaluate regional  wall  motion despite using Definity contrast. Recommend obtaining limited  echo with contrast when tachycardia is resolved. There is mild left  ventricular hypertrophy. Indeterminate  diastolic filling due to E-A fusion.   3. Right ventricular systolic function is mildly reduced. The right  ventricular size is moderately enlarged. Tricuspid regurgitation signal is  inadequate for assessing PA pressure.   4. The mitral valve was not well visualized. No evidence of mitral valve  regurgitation. No evidence of mitral stenosis.   5. The aortic valve is tricuspid. Aortic valve regurgitation is not  visualized. No aortic stenosis is present.   Comparison(s): No prior Echocardiogram.      Patient Profile     [name] is a 82 y.o. female with a hx of permanent A fib, hx of PE/DVT, OSA on CPAP, HLD, obesity, HTN, anxiety, chronic leg edema,  cardiology is following for CHF and A fib RVR.   Assessment & Plan    Permanent atrial fibrillation with RVR - TSH WNL from 09/21/22 - likely mediated by acute cellulitis, hypokalemia - keep K >4 and Mag >2 please  - on PTA cardizem 360mg , intolerant to metoprolol 75mg  BID (started this admission) due to sleep disturbance and depression changed to Bystolic which she is tolerating well - Echo with normal LV function - Continue Cardizem CD 360 mg daily and Bystolic 5 mg daily as well as warfarin per pharmacy -No SGLT2i due to risk of yeast infections -INR 2.4 yesterday  Acute diastolic heart failure  - presented with chronic BLE edema that worsened in the setting of cellulitis, she denied  any historical dyspnea, orthopnea, PND - BNP 279, POA - CXR with Mild patchy bilateral lower lobe opacities, raising concern for infection/pneumonia, less likely atelectasis, POA  - Initial Echo on 09/20/22 with LVEF 40-50% (EF normal in 2013), mild LVH, mild reduce RV, mod RV enlargement, image was poor  - Repeated limited Echo 09/25/22 with controlled Afib: LVEF  55-60%, no RWMA, normal RV, mild LAE, The left ventricular function has improved.  - likely tachycardia mediated CM - s/p IV Lasix and was changed to oral Lasix 40 mg daily yesterday  - weight down from 244>234>228lbs since admission - She put out 1.125 L yesterday and is net -4.8 L since admission - Continue Cardizem CD 360 mg daily, irbesartan 300 mg daily, Bystolic 5 mg daily   HTN -BP controlled on exam today at 134/74 mmHg  -Continue Cardizem CD 360 mg daily, irbesartan 3 mg daily, Bystolic 5 mg daily   HLD RLE cellulitis  Hypokalemia  Hypothyroidism  OSA Obesity  Anxiety  - per primary team    [facility] will sign off.   Medication Recommendations: Cardizem CD 360 mg a day, Lasix 40 mg a day, irbesartan 300 mg a day, Bystolic 5 mg a day, warfarin per pharmacy Other recommendations (labs, testing, etc):  none Follow up as an outpatient: Dr. Eldridge Dace in 1 to 2 weeks and Coumadin clinic in 1 week   For questions or updates, please contact [facility] Please consult [email or web address] for contact info under        Signed, Armanda Magic, MD  09/26/2022, 9:30 AM

## 2022-09-26 NOTE — TOC Progression Note (Addendum)
Transition of Care [facility]) - Progression Note    Patient Details  Name: [name] MRN: [id number] Date of Birth: 10-25-1940  [facility]) CM/SW Contact  Howell Rucks, RN Phone Number: 09/26/2022, 3:00 PM  Clinical Narrative: Met with pt and spouse in room, educated on [facility] PT and answered questions. Pt reports has walker x 2 at home, reports has support from family and friends. Pt and spouse agreeable to HHPT. [facility] accepted for HHPT, rep-Kelly. Awaiting medically stability for discharge. Will continue to follow.     Expected Discharge Plan: Home w Home Health Services Barriers to Discharge: Continued Medical Work up  Expected Discharge Plan and Services   Discharge Planning Services: CM Consult   Living arrangements for the past 2 months: Single Family Home Expected Discharge Date: 09/26/22                                     Social Determinants of Health (SDOH) Interventions SDOH Screenings   Food Insecurity: No Food Insecurity (09/18/2022)  Housing: Low Risk  (09/18/2022)  Transportation Needs: No Transportation Needs (09/18/2022)  Utilities: Not At Risk (09/18/2022)  Tobacco Use: Low Risk  (09/18/2022)    Readmission Risk Interventions    09/19/2022    2:13 PM  Readmission Risk Prevention Plan  Post Dischage Appt Complete  Medication Screening Complete

## 2022-09-26 NOTE — Discharge Summary (Signed)
Physician Discharge Summary   Patient: [name] MRN: [id number] DOB: 12/03/1940  Admit date:     09/18/2022  Discharge date: 09/26/2022  Discharge Physician: Merlene Laughter   PCP: Garlan Fillers, MD   Recommendations at discharge:  {Tip this will not be part of the note when signed- Example include specific recommendations for outpatient follow-up, pending tests to follow-up on. (Optional):26781}  ***F/U PCP F/U Cardiology  Discharge Diagnoses: Principal Problem:   Sepsis due to cellulitis Active Problems:   OSA on CPAP   Obesity   Primary hypertension   Warfarin anticoagulation   Cellulitis of right leg   Heart failure with preserved ejection fraction   Permanent atrial fibrillation  Resolved Problems:   * No resolved hospital problems. [facility] Course: No notes on file  Assessment and Plan: No notes have been filed under this hospital service. Service: Hospitalist     {Tip this will not be part of the note when signed Body mass index is 39.27 kg/m. , ,  (Optional):26781}  {(NOTE) Pain control PDMP Statment (Optional):26782} Consultants: *** Procedures performed: ***  Disposition: {Plan; Disposition:26390} Diet recommendation:  Discharge Diet Orders (From admission, onward)     Start     Ordered   09/26/22 0000  Diet - low sodium heart healthy        09/26/22 1459           {Diet_Plan:26776} DISCHARGE MEDICATION: Allergies as of 09/26/2022       Reactions   Codeine Other (See Comments)   Caused Hyperactivity and Dysphoria   Erythromycin Itching, Anxiety, Other (See Comments)   Also with jitters   Tetracyclines & Related Itching   Also with jitters   Statins Other (See Comments)   Leg muscle weakness   Atenolol Other (See Comments)   Depression   Citalopram Hydrobromide Nausea Only, Other (See Comments)   Nausea/Fatigue   Clindamycin/lincomycin Other (See Comments)   Via IV, has caused a metallic taste in the mouth     Coenzyme Q10 Itching   Losartan Potassium Other (See Comments)   Muscle aches   Motrin [ibuprofen] Other (See Comments)   Is to not take this   Rosuvastatin Other (See Comments)   Myalgias   Simvastatin Other (See Comments)   Myalgias   Tape    PAPER TAPE IS PPEFERRED   Latex Rash        Medication List     STOP taking these medications    doxycycline 100 MG capsule Commonly known as: VIBRAMYCIN   irbesartan-hydrochlorothiazide 300-12.5 MG tablet Commonly known as: AVALIDE       TAKE these medications    acetaminophen 500 MG tablet Commonly known as: TYLENOL Take 500 mg by mouth at bedtime.   acetaminophen 325 MG tablet Commonly known as: TYLENOL Take 325-650 mg by mouth 2 (two) times daily as needed for mild pain or headache.   benzocaine 10 % mucosal gel Commonly known as: ORAJEL Use as directed in the mouth or throat 3 (three) times daily as needed for mouth pain.   calcium carbonate 500 MG chewable tablet Commonly known as: TUMS - dosed in mg elemental calcium Chew 1 tablet by mouth 2 (two) times daily as needed for indigestion or heartburn.   ciprofloxacin 500 MG tablet Commonly known as: CIPRO Take 1 tablet (500 mg total) by mouth 2 (two) times daily for 1 day.   diltiazem 360 MG 24 hr capsule Commonly known as: TIAZAC Take 360 mg  by mouth at bedtime.   docusate sodium 100 MG capsule Commonly known as: COLACE Take 1 capsule (100 mg total) by mouth 2 (two) times daily.   furosemide 40 MG tablet Commonly known as: LASIX Take 1 tablet (40 mg total) by mouth daily. Start taking on: September 27, 2022   irbesartan 300 MG tablet Commonly known as: AVAPRO Take 1 tablet (300 mg total) by mouth daily. Start taking on: September 27, 2022   levothyroxine 100 MCG tablet Commonly known as: SYNTHROID Take 100 mcg by mouth daily before breakfast.   LORazepam 1 MG tablet Commonly known as: ATIVAN Take 1.5 mg by mouth See admin instructions. Take 1.5 mg by  mouth at bedtime and an additional 1-1.5mg  once a day as needed for anxiety   Lutein-Zeaxanthin 25-5 MG Caps Take 1 capsule by mouth daily with breakfast.   nebivolol 5 MG tablet Commonly known as: BYSTOLIC Take 1 tablet (5 mg total) by mouth daily. Start taking on: September 27, 2022   ondansetron 4 MG tablet Commonly known as: ZOFRAN Take 1 tablet (4 mg total) by mouth every 6 (six) hours as needed for nausea.   oxyCODONE 5 MG immediate release tablet Commonly known as: Oxy IR/ROXICODONE Take 1 tablet (5 mg total) by mouth every 6 (six) hours as needed for moderate pain.   OXYGEN Inhale 3.5 L/min into the lungs at bedtime.   polyethylene glycol 17 g packet Commonly known as: MIRALAX / GLYCOLAX Take 17 g by mouth daily as needed for mild constipation.   PRESCRIPTION MEDICATION CPAP- At bedtime   REFRESH LIQUIGEL OP Place 1 drop into both eyes at bedtime.   SIMPLY SALINE NA Place 1 spray into both nostrils as needed (for dryness).   triamcinolone cream 0.1 % Commonly known as: KENALOG Apply 1 Application topically See admin instructions. Apply to both legs 2 times a day   warfarin 5 MG tablet Commonly known as: COUMADIN Take 5 mg by mouth at bedtime.        Follow-up Information     [facility] Follow up on 10/02/2022.   Specialty: Cardiology Why: Appointment with the Coumadin Clinic- 1:15 PM Contact information: [address] [facility] [address] [id number] [place] [address] [phone]        Sharlene Dory, PA-C Follow up on 10/08/2022.   Specialty: Cardiology Why: Appointment at 10:55 Contact information: [address] [place] [address] [phone]                Discharge Exam: Ceasar Mons Weights   09/24/22 0340 09/25/22 0449 09/26/22 0408  Weight: 107.3 kg 106.2 kg 103.8 kg   ***  Condition at discharge: {DC Condition:26389}  The results of significant diagnostics from this  hospitalization (including imaging, microbiology, ancillary and laboratory) are listed below for reference.   Imaging Studies: DG CHEST PORT 1 VIEW  Result Date: 09/26/2022 CLINICAL DATA:  Shortness of breath. EXAM: PORTABLE CHEST 1 VIEW COMPARISON:  09/20/2022. FINDINGS: Interval improvement in patchy bilateral lower lung airspace opacities. No consolidation. No pleural effusion or pneumothorax. Stable cardiac and mediastinal contours. IMPRESSION: Interval improvement in patchy bilateral lower lung airspace opacities. No consolidation. Electronically Signed   By: Orvan Falconer M.D.   On: 09/26/2022 08:41   ECHOCARDIOGRAM LIMITED  Result Date: 09/24/2022    ECHOCARDIOGRAM LIMITED REPORT   Patient Name:   JESSALYNN MCCOWAN [place] Date of Exam: 09/24/2022 Medical Rec #:  [id number]  Height:       64.0 in Accession #:    [id number]           Weight:       236.6 lb Date of Birth:  [birth year]            BSA:          2.101 m Patient Age:    82 years             BP:           144/79 mmHg Patient Gender: F                    HR:           88 bpm. Exam Location:  Inpatient Procedure: Limited Echo, Cardiac Doppler, Color Doppler and 2D Echo Indications:    A fib  History:        Patient has prior history of Echocardiogram examinations, most                 recent 09/19/2022. Arrythmias:Atrial Fibrillation.  Sonographer:    Lucy Antigua Referring Phys: [id number] Cyndi Bender IMPRESSIONS  1. Left ventricular ejection fraction, by estimation, is 55 to 60%. The left ventricle has normal function. The left ventricle has no regional wall motion abnormalities.  2. Right ventricular systolic function is normal. The right ventricular size is not well visualized.  3. Left atrial size was mildly dilated.  4. The mitral valve is normal in structure. No evidence of mitral stenosis.  5. Aortic valve regurgitation is not visualized. Aortic valve sclerosis/calcification is present, without any evidence of aortic stenosis.  Comparison(s): Prior images reviewed side by side. The left ventricular function has improved. No longer in atrial fibrillation. FINDINGS  Left Ventricle: Left ventricular ejection fraction, by estimation, is 55 to 60%. The left ventricle has normal function. The left ventricle has no regional wall motion abnormalities. The left ventricular internal cavity size was normal in size. There is  no left ventricular hypertrophy. Right Ventricle: The right ventricular size is not well visualized. Right ventricular systolic function is normal. Left Atrium: Left atrial size was mildly dilated. Right Atrium: Right atrial size was not well visualized. Pericardium: There is no evidence of pericardial effusion. Mitral Valve: The mitral valve is normal in structure. Mild mitral annular calcification. No evidence of mitral valve stenosis. Tricuspid Valve: The tricuspid valve is not well visualized. Aortic Valve: Aortic valve regurgitation is not visualized. Aortic valve sclerosis/calcification is present, without any evidence of aortic stenosis. Pulmonic Valve: The pulmonic valve was not well visualized. Aorta: The aortic root and ascending aorta are structurally normal, with no evidence of dilitation. IAS/Shunts: The interatrial septum was not well visualized. LEFT VENTRICLE PLAX 2D LVIDd:         4.90 cm LV PW:         1.20 cm LV IVS:        1.00 cm  Rachelle Hora Croitoru MD Electronically signed by Thurmon Fair MD Signature Date/Time: 09/24/2022/5:55:59 PM    Final    DG CHEST PORT 1 VIEW  Result Date: 09/20/2022 CLINICAL DATA:  Shortness of breath EXAM: PORTABLE CHEST 1 VIEW COMPARISON:  CT chest dated 05/05/2012 FINDINGS: Mild patchy bilateral lower lobe opacities, raising concern for infection/pneumonia, less likely atelectasis. No pleural effusion or pneumothorax. The heart is top-normal in size. IMPRESSION: Mild patchy bilateral lower lobe opacities, raising concern for infection/pneumonia, less likely atelectasis.  Electronically Signed   By: Lurlean Horns  Rito Ehrlich M.D.   On: 09/20/2022 21:11   ECHOCARDIOGRAM COMPLETE  Result Date: 09/20/2022    ECHOCARDIOGRAM REPORT   Patient Name:   AURELIE DICENZO [place] Date of Exam: 09/20/2022 Medical Rec #:  [id number]            Height:       64.0 in Accession #:    [id number]           Weight:       240.1 lb Date of Birth:  1941-01-06            BSA:          2.114 m Patient Age:    82 years             BP:           135/88 mmHg Patient Gender: F                    HR:           124 bpm. Exam Location:  Inpatient Procedure: 2D Echo, Cardiac Doppler and Color Doppler Indications:    Dyspnea  History:        Patient has prior history of Echocardiogram examinations, most                 recent 05/17/2012. Arrythmias:Atrial Fibrillation,                 Signs/Symptoms:Dyspnea and Shortness of Breath; Risk                 Factors:Hypertension and Sleep Apnea.  Sonographer:    Lucy Antigua Referring Phys: [id number] ERIC J [place]  Sonographer Comments: Technically difficult study due to poor echo windows. Image acquisition challenging due to patient body habitus and Image acquisition challenging due to respiratory motion. IMPRESSIONS  1. Poor echo windows  2. Challenging study despite using Definity contrast. Tachycardia and poor echo windows limit accurate assessment of LVEF. Left ventricular ejection fraction, by estimation, is 40 to 50%. The left ventricle has mildly decreased function. Left ventricular endocardial border not optimally defined to evaluate regional wall motion despite using Definity contrast. Recommend obtaining limited echo with contrast when tachycardia is resolved. There is mild left ventricular hypertrophy. Indeterminate diastolic filling due to E-A fusion.  3. Right ventricular systolic function is mildly reduced. The right ventricular size is moderately enlarged. Tricuspid regurgitation signal is inadequate for assessing PA pressure.  4. The mitral valve was not well  visualized. No evidence of mitral valve regurgitation. No evidence of mitral stenosis.  5. The aortic valve is tricuspid. Aortic valve regurgitation is not visualized. No aortic stenosis is present. Comparison(s): No prior Echocardiogram. FINDINGS  Left Ventricle: Left ventricular ejection fraction, by estimation, is 40 to 45%. The left ventricle has mildly decreased function. Left ventricular endocardial border not optimally defined to evaluate regional wall motion. Definity contrast agent was given IV to delineate the left ventricular endocardial borders. The left ventricular internal cavity size was normal in size. There is mild left ventricular hypertrophy. Indeterminate diastolic filling due to E-A fusion. Right Ventricle: The right ventricular size is moderately enlarged. Right vetricular wall thickness was not well visualized. Right ventricular systolic function is mildly reduced. Tricuspid regurgitation signal is inadequate for assessing PA pressure. Left Atrium: Left atrial size was normal in size. Right Atrium: Right atrial size was normal in size. Pericardium: There is no evidence of pericardial effusion. Mitral Valve: The mitral valve was not well visualized. No  evidence of mitral valve regurgitation. No evidence of mitral valve stenosis. Tricuspid Valve: The tricuspid valve is not well visualized. Tricuspid valve regurgitation is not demonstrated. No evidence of tricuspid stenosis. Aortic Valve: The aortic valve is tricuspid. Aortic valve regurgitation is not visualized. No aortic stenosis is present. Aortic valve mean gradient measures 6.0 mmHg. Aortic valve peak gradient measures 10.1 mmHg. Aortic valve area, by VTI measures 2.14  cm. Pulmonic Valve: The pulmonic valve was not well visualized. Pulmonic valve regurgitation is not visualized. No evidence of pulmonic stenosis. Aorta: The aortic root and ascending aorta are structurally normal, with no evidence of dilitation. Venous: The inferior vena  cava was not well visualized. IAS/Shunts: No atrial level shunt detected by color flow Doppler.  LEFT VENTRICLE PLAX 2D LVIDd:         4.30 cm   Diastology LVIDs:         3.60 cm   LV e' medial:    10.60 cm/s LV PW:         1.30 cm   LV E/e' medial:  12.9 LV IVS:        1.20 cm   LV e' lateral:   10.70 cm/s LVOT diam:     2.30 cm   LV E/e' lateral: 12.8 LV SV:         61 LV SV Index:   29 LVOT Area:     4.15 cm  LEFT ATRIUM           Index LA Vol (A4C): 50.9 ml 24.07 ml/m  AORTIC VALVE AV Area (Vmax):    2.10 cm AV Area (Vmean):   2.14 cm AV Area (VTI):     2.14 cm AV Vmax:           159.00 cm/s AV Vmean:          113.000 cm/s AV VTI:            0.287 m AV Peak Grad:      10.1 mmHg AV Mean Grad:      6.0 mmHg LVOT Vmax:         80.20 cm/s LVOT Vmean:        58.300 cm/s LVOT VTI:          0.148 m LVOT/AV VTI ratio: 0.52  AORTA Ao Asc diam: 3.20 cm MITRAL VALVE MV Area (PHT): 5.84 cm     SHUNTS MV Decel Time: 130 msec     Systemic VTI:  0.15 m MV E velocity: 137.00 cm/s  Systemic Diam: 2.30 cm Vishnu Priya Mallipeddi Electronically signed by Winfield Rast Mallipeddi Signature Date/Time: 09/20/2022/2:33:04 PM    Final    VAS [place] LOWER EXTREMITY VENOUS (DVT) 7a-7p  Result Date: 09/18/2022  Lower Venous DVT Study Patient Name:  METTIE ROYLANCE Reveron  Date of Exam:   09/18/2022 Medical Rec #: [id number]             Accession #:    [id number] Date of Birth: 1941/06/02             Patient Gender: F Patient Age:   49 years Exam Location:  [facility] Procedure:      VAS [place] LOWER EXTREMITY VENOUS (DVT) Referring Phys: JULIE HAVILAND --------------------------------------------------------------------------------  Indications: Right leg pain, erythema, cellulitis workup. Other Indications: Varicosities. Limitations: Body habitus. Patient pain/sensitivity to compression. Comparison Study: 05-07-2021 Prior left lower extremity venous study was  negative for DVT. Performing Technologist: Jean Rosenthal  RDMS, RVT  Examination Guidelines: A complete evaluation includes B-mode imaging, spectral Doppler, color Doppler, and power Doppler as needed of all accessible portions of each vessel. Bilateral testing is considered an integral part of a complete examination. Limited examinations for reoccurring indications may be performed as noted. The reflux portion of the exam is performed with the patient in reverse Trendelenburg.  +---------+---------------+---------+-----------+----------+-------------------+ RIGHT    CompressibilityPhasicitySpontaneityPropertiesThrombus Aging      +---------+---------------+---------+-----------+----------+-------------------+ CFV      Full           Yes      Yes                                      +---------+---------------+---------+-----------+----------+-------------------+ SFJ      Full                                                             +---------+---------------+---------+-----------+----------+-------------------+ FV Prox  Full                                                             +---------+---------------+---------+-----------+----------+-------------------+ FV Mid   Full                                                             +---------+---------------+---------+-----------+----------+-------------------+ FV DistalFull                                                             +---------+---------------+---------+-----------+----------+-------------------+ PFV      Full                                                             +---------+---------------+---------+-----------+----------+-------------------+ POP      Full           Yes      Yes                                      +---------+---------------+---------+-----------+----------+-------------------+ PTV      Full                                                              +---------+---------------+---------+-----------+----------+-------------------+  PERO                    Yes      Yes                  Not well visualized +---------+---------------+---------+-----------+----------+-------------------+ Gastroc  Full                                                             +---------+---------------+---------+-----------+----------+-------------------+   +----+---------------+---------+-----------+----------+--------------+ LEFTCompressibilityPhasicitySpontaneityPropertiesThrombus Aging +----+---------------+---------+-----------+----------+--------------+ CFV Full           Yes      Yes                                 +----+---------------+---------+-----------+----------+--------------+     Summary: RIGHT: - There is no evidence of deep vein thrombosis in the lower extremity. However, portions of this examination were limited- see technologist comments above.  - No cystic structure found in the popliteal fossa.  LEFT: - No evidence of common femoral vein obstruction.  *See table(s) above for measurements and observations. Electronically signed by Heath Lark on 09/18/2022 at 4:51:31 PM.    Final     Microbiology: Results for orders placed or performed during the hospital encounter of 09/18/22  Culture, blood (routine x 2)     Status: None   Collection Time: 09/18/22  3:00 PM   Specimen: BLOOD LEFT HAND  Result Value Ref Range Status   Specimen Description   Final    BLOOD LEFT HAND Performed at [facility], [address] [place], [place] [address]    Special Requests   Final    BOTTLES DRAWN AEROBIC AND ANAEROBIC Blood Culture results may not be optimal due to an inadequate volume of blood received in culture bottles Performed at [facility], [address], [place], [place] [address]    Culture   Final    NO GROWTH 5 DAYS Performed at [facility], [address], [place], [place]  [address]    Report Status 09/23/2022 FINAL  Final  Aerobic Culture w Gram Stain (superficial specimen)     Status: None   Collection Time: 09/18/22  4:00 PM   Specimen: Wound  Result Value Ref Range Status   Specimen Description   Final    WOUND SKIN, OTHER Performed at [facility], [address] [place], [place] [address]    Special Requests   Final    NONE Performed at [facility], [address], [place], [place] [address]    Gram Stain   Final    NO WBC SEEN NO ORGANISMS SEEN Performed at [facility], [address], [place], [place] [address]    Culture RARE PSEUDOMONAS AERUGINOSA  Final   Report Status 09/21/2022 FINAL  Final   Organism ID, Bacteria PSEUDOMONAS AERUGINOSA  Final      Susceptibility   Pseudomonas aeruginosa - MIC*    CEFTAZIDIME 2 SENSITIVE Sensitive     CIPROFLOXACIN <=0.25 SENSITIVE Sensitive     GENTAMICIN <=1 SENSITIVE Sensitive     IMIPENEM 2 SENSITIVE Sensitive     PIP/TAZO 8 SENSITIVE Sensitive     CEFEPIME 2 SENSITIVE Sensitive     *  RARE PSEUDOMONAS AERUGINOSA  Culture, blood (Routine X 2) w Reflex to ID Panel     Status: None   Collection Time: 09/20/22  6:09 PM   Specimen: BLOOD RIGHT ARM  Result Value Ref Range Status   Specimen Description BLOOD RIGHT ARM  Final   Special Requests   Final    BOTTLES DRAWN AEROBIC AND ANAEROBIC Blood Culture adequate volume   Culture   Final    NO GROWTH 5 DAYS Performed at [facility], [address], [place], [place] [address]    Report Status 09/25/2022 FINAL  Final  Culture, blood (Routine X 2) w Reflex to ID Panel     Status: None   Collection Time: 09/20/22  6:09 PM   Specimen: BLOOD LEFT HAND  Result Value Ref Range Status   Specimen Description BLOOD LEFT HAND  Final   Special Requests   Final    BOTTLES DRAWN AEROBIC AND ANAEROBIC Blood Culture adequate volume   Culture   Final    NO GROWTH 5 DAYS Performed at [facility],  [address], [place], [place] [address]    Report Status 09/25/2022 FINAL  Final    Labs: CBC: Recent Labs  Lab 09/22/22 0427 09/23/22 0407 09/24/22 0401 09/25/22 0424 09/26/22 0358  WBC 9.4 8.8 8.0 8.4 8.9  NEUTROABS  --   --   --  5.1 5.6  HGB 12.5 12.8 13.0 13.5 14.5  HCT 39.0 40.1 40.6 42.1 45.3  MCV 94.2 93.3 93.8 94.4 94.6  PLT 177 236 274 303 311   Basic Metabolic Panel: Recent Labs  Lab 09/22/22 0427 09/23/22 0407 09/24/22 0401 09/25/22 0424 09/26/22 0358  NA 132* 134* 134* 136 135  K 4.0 3.5 3.4* 3.8 4.0  CL 96* 96* 97* 97* 97*  CO2 29 29 30 29 30   GLUCOSE 127* 121* 148* 115* 117*  BUN 11 13 15 16 22   CREATININE 0.67 0.67 0.86 0.74 0.88  CALCIUM 8.3* 8.4* 8.4* 8.6* 8.9  MG 2.5* 2.0 2.0 2.0 2.2  PHOS  --   --   --  3.1 3.9   Liver Function Tests: Recent Labs  Lab 09/25/22 0424 09/26/22 0358  AST 21 31  ALT 24 25  ALKPHOS 60 59  BILITOT 0.7 1.2  PROT 7.5 7.7  ALBUMIN 2.8* 2.8*   CBG: No results for input(s): "GLUCAP" in the last 168 hours.  Discharge time spent: {LESS THAN/GREATER UJWJ:19147} 30 minutes.  Signed: Merlene Laughter, DO [facility] 09/26/2022

## 2022-09-26 NOTE — Progress Notes (Signed)
SATURATION QUALIFICATIONS: (This note is used to comply with regulatory documentation for home oxygen)  Patient Saturations on Room Air at Rest = 96%  Patient Saturations on Room Air while Ambulating = 89-94%  Please briefly explain why patient needs home oxygen:

## 2022-09-26 NOTE — Progress Notes (Signed)
Bladder scan 0

## 2022-10-01 DIAGNOSIS — Z85858 Personal history of malignant neoplasm of other endocrine glands: Secondary | ICD-10-CM | POA: Diagnosis not present

## 2022-10-01 DIAGNOSIS — E871 Hypo-osmolality and hyponatremia: Secondary | ICD-10-CM | POA: Diagnosis not present

## 2022-10-01 DIAGNOSIS — Z86718 Personal history of other venous thrombosis and embolism: Secondary | ICD-10-CM | POA: Diagnosis not present

## 2022-10-01 DIAGNOSIS — H25013 Cortical age-related cataract, bilateral: Secondary | ICD-10-CM | POA: Diagnosis not present

## 2022-10-01 DIAGNOSIS — E785 Hyperlipidemia, unspecified: Secondary | ICD-10-CM | POA: Diagnosis not present

## 2022-10-01 DIAGNOSIS — G8929 Other chronic pain: Secondary | ICD-10-CM | POA: Diagnosis not present

## 2022-10-01 DIAGNOSIS — I429 Cardiomyopathy, unspecified: Secondary | ICD-10-CM | POA: Diagnosis not present

## 2022-10-01 DIAGNOSIS — I110 Hypertensive heart disease with heart failure: Secondary | ICD-10-CM | POA: Diagnosis not present

## 2022-10-01 DIAGNOSIS — Z86711 Personal history of pulmonary embolism: Secondary | ICD-10-CM | POA: Diagnosis not present

## 2022-10-01 DIAGNOSIS — D869 Sarcoidosis, unspecified: Secondary | ICD-10-CM | POA: Diagnosis not present

## 2022-10-01 DIAGNOSIS — I4821 Permanent atrial fibrillation: Secondary | ICD-10-CM | POA: Diagnosis not present

## 2022-10-01 DIAGNOSIS — L03115 Cellulitis of right lower limb: Secondary | ICD-10-CM | POA: Diagnosis not present

## 2022-10-01 DIAGNOSIS — Z9981 Dependence on supplemental oxygen: Secondary | ICD-10-CM | POA: Diagnosis not present

## 2022-10-01 DIAGNOSIS — E039 Hypothyroidism, unspecified: Secondary | ICD-10-CM | POA: Diagnosis not present

## 2022-10-01 DIAGNOSIS — M549 Dorsalgia, unspecified: Secondary | ICD-10-CM | POA: Diagnosis not present

## 2022-10-01 DIAGNOSIS — Z6841 Body Mass Index (BMI) 40.0 and over, adult: Secondary | ICD-10-CM | POA: Diagnosis not present

## 2022-10-01 DIAGNOSIS — Z79810 Long term (current) use of selective estrogen receptor modulators (SERMs): Secondary | ICD-10-CM | POA: Diagnosis not present

## 2022-10-01 DIAGNOSIS — G4733 Obstructive sleep apnea (adult) (pediatric): Secondary | ICD-10-CM | POA: Diagnosis not present

## 2022-10-01 DIAGNOSIS — I5033 Acute on chronic diastolic (congestive) heart failure: Secondary | ICD-10-CM | POA: Diagnosis not present

## 2022-10-01 DIAGNOSIS — A419 Sepsis, unspecified organism: Secondary | ICD-10-CM | POA: Diagnosis not present

## 2022-10-01 DIAGNOSIS — F419 Anxiety disorder, unspecified: Secondary | ICD-10-CM | POA: Diagnosis not present

## 2022-10-01 DIAGNOSIS — E8809 Other disorders of plasma-protein metabolism, not elsewhere classified: Secondary | ICD-10-CM | POA: Diagnosis not present

## 2022-10-01 DIAGNOSIS — Z86010 Personal history of colonic polyps: Secondary | ICD-10-CM | POA: Diagnosis not present

## 2022-10-02 ENCOUNTER — Ambulatory Visit: Payer: Medicare HMO

## 2022-10-02 ENCOUNTER — Telehealth: Payer: Self-pay | Admitting: *Deleted

## 2022-10-02 NOTE — Progress Notes (Signed)
  Care Coordination   Note   10/02/2022 Name: [name] MRN: [id number] DOB: [birth year]  [name] is a 82 y.o. year old female who sees Garlan Fillers, MD for primary care. I reached out to [name] by phone today to offer care coordination services.  [name] was given information about Care Coordination services today including:   The Care Coordination services include support from the care team which includes your Nurse Coordinator, Clinical Social Worker, or Pharmacist.  The Care Coordination team is here to help remove barriers to the health concerns and goals most important to you. Care Coordination services are voluntary, and the patient may decline or stop services at any time by request to their care team member.   Care Coordination Consent Status: Patient agreed to services and verbal consent obtained.   Follow up plan:  Telephone appointment with care coordination team member scheduled for:  10/09/22  Encounter Outcome:  Pt. Scheduled  [facility] Coordination Care Guide  Direct Dial: [phone]

## 2022-10-02 NOTE — Progress Notes (Signed)
  Care Coordination  Outreach Note  10/02/2022 Name: ZORA GLENDENNING MRN: [id number] DOB: 08-27-1940   Care Coordination Outreach Attempts:  An unsuccessful telephone outreach was attempted today to offer the patient information about available care coordination in reference to a EMMI call.   Follow Up Plan:  Additional outreach attempts will be made to offer the patient care coordination information and services.   Encounter Outcome:  No Answer  Christie Nottingham  Care Coordination Care Guide  Direct Dial: [phone]

## 2022-10-06 DIAGNOSIS — N182 Chronic kidney disease, stage 2 (mild): Secondary | ICD-10-CM | POA: Diagnosis not present

## 2022-10-06 DIAGNOSIS — E876 Hypokalemia: Secondary | ICD-10-CM | POA: Diagnosis not present

## 2022-10-06 DIAGNOSIS — I5032 Chronic diastolic (congestive) heart failure: Secondary | ICD-10-CM | POA: Diagnosis not present

## 2022-10-06 DIAGNOSIS — M8580 Other specified disorders of bone density and structure, unspecified site: Secondary | ICD-10-CM | POA: Diagnosis not present

## 2022-10-06 DIAGNOSIS — R3589 Other polyuria: Secondary | ICD-10-CM | POA: Diagnosis not present

## 2022-10-06 DIAGNOSIS — G4733 Obstructive sleep apnea (adult) (pediatric): Secondary | ICD-10-CM | POA: Diagnosis not present

## 2022-10-06 DIAGNOSIS — Z7901 Long term (current) use of anticoagulants: Secondary | ICD-10-CM | POA: Diagnosis not present

## 2022-10-06 DIAGNOSIS — L03115 Cellulitis of right lower limb: Secondary | ICD-10-CM | POA: Diagnosis not present

## 2022-10-06 DIAGNOSIS — E559 Vitamin D deficiency, unspecified: Secondary | ICD-10-CM | POA: Diagnosis not present

## 2022-10-06 DIAGNOSIS — I872 Venous insufficiency (chronic) (peripheral): Secondary | ICD-10-CM | POA: Diagnosis not present

## 2022-10-06 DIAGNOSIS — Z0000 Encounter for general adult medical examination without abnormal findings: Secondary | ICD-10-CM | POA: Diagnosis not present

## 2022-10-06 DIAGNOSIS — I4811 Longstanding persistent atrial fibrillation: Secondary | ICD-10-CM | POA: Diagnosis not present

## 2022-10-07 NOTE — Progress Notes (Unsigned)
Office Visit    Patient Name: [name] Date of Encounter: 10/08/2022  PCP:  Garlan Fillers, MD   [facility]  Cardiologist:  Lance Muss, MD  Advanced Practice Provider:  No care team member to display Electrophysiologist:  None [phone]  HPI    [name] is a 82 y.o. female with a past medical history of atrial fibrillation (on Coumadin), DVT/PE, hypertension, OSA (on CPAP), HLD, obesity presents today for follow-up appointment.  The patient has been followed by Dr. Jiles Garter since 2016 by our records.  Previous exercise test completed in 2009 was negative for ischemia.  She was seen 8/21 and blood pressure was better controlled with decreased irbesartan/HCTZ.  She was having some complaints of back pain at that time.  Rate controlled with atrial fibrillation and tolerating Coumadin.  She was noted to have some lower extremity edema and advised to use compression stockings with possibility of stopping HCTZ and giving Lasix as needed.  Since last being seen in the office she had been experiencing some chest heaviness but denied any chest pain.  She has also experienced increased anxiety which she feels is contributing to the chest heaviness.  She reported that her husband was recently diagnosed with prostate cancer and his diagnosis has caused emotional turmoil.  She is tolerating her current medications without any adverse effects.  Blood pressure at that time was 138/80 and heart rate was 90 bpm.  She did have bilateral pitting edema 2+ in her lower extremities.  Reported this to her primary care who started her on Lasix recently and she been taking it as needed.  Last seen in the office 08/26/2022.  Today, she tells me that she received horrible care when she was in the hospital.  Her medications were switched all around and her nighttime pills were trying to be given to her in the morning but she would refuse these medications because  they were supposed to be p.m. medications.  From a fluid standpoint, she states that her legs are much better even though she still has some swelling today on exam.  She also endorses a terrible yeast infection underneath her breasts as well as in her groin.  She was on antibiotics cellulitis of the legs for several days and she thinks this must be the culprit.  This was also why they did not prescribe Farxiga/Jardiance while she was in the hospital.  Most recent echocardiogram reviewed from 09/24/2022 which showed LVEF normalized to 55 to 60%.  She states she had some recent lab work through Dr. Norval Gable office that showed her potassium was 3.5.  She was given some replacement pills to take but she has not picked this up from the pharmacy yet.  Reports no shortness of breath nor dyspnea on exertion. Reports no chest pain, pressure, or tightness. No orthopnea, PND. Reports no palpitations.   Past Medical History    Past Medical History:  Diagnosis Date   Anxiety    takes Lorazepam daily as needed   Arthritis    Cancer (HCC)    thyroid   Cataracts, bilateral    immature   Chronic back pain    compressed vertebrae,takes Fosamax weekly   Family history of adverse reaction to anesthesia    granddaughter gets very sick and mean   History of blood transfusion    no abnormal reaction noted   History of bronchitis 05/2015   History of colon polyps    benign  Hyperlipidemia    takes Zetia daily   Hypertension    takes Diltiazem and Avapro daily   Hypothyroidism    takes Synthroid daily   Ischemic colitis (HCC)    hx of-many yrs ago   Joint pain    Joint swelling    OSA on CPAP    05-08-12 AHi was 46, RDI  53. titrated to   9 cm water  with 3 cm flex.-nadir 75%   PE (pulmonary embolism)    takes Coumadin daily   Peripheral edema    Pneumonia 1992   hx of   Sarcoidosis    Shortness of breath    Urinary frequency    Weakness    left leg.Numbness and tingling.Has sciatica   Wheeze     occaionally. Not new   Past Surgical History:  Procedure Laterality Date   ABDOMINAL HYSTERECTOMY     APPENDECTOMY     BREAST BIOPSY     biopsies on right x 2   BREAST CYST ASPIRATION Left    CHOLECYSTECTOMY N/A 11/29/2015   Procedure: LAPAROSCOPIC CHOLECYSTECTOMY;  Surgeon: Almond Lint, MD;  Location: [facility] OR;  Service: General;  Laterality: N/A;   COLONOSCOPY     DILATION AND CURETTAGE OF UTERUS     LIVER BIOPSY     MELANOMA EXCISION     removed from left leg   scalene surgery  1972   "trying to find out what was wrong with her lungs"   THYROID SURGERY      Allergies  Allergies  Allergen Reactions   Codeine Other (See Comments)    Caused Hyperactivity and Dysphoria   Erythromycin Itching, Anxiety and Other (See Comments)    Also with jitters   Tetracyclines & Related Itching    Also with jitters   Statins Other (See Comments)    Leg muscle weakness   Atenolol Other (See Comments)    Depression    Citalopram Hydrobromide Nausea Only and Other (See Comments)    Nausea/Fatigue   Clindamycin/Lincomycin Other (See Comments)    Via IV, has caused a metallic taste in the mouth    Coenzyme Q10 Itching   Losartan Potassium Other (See Comments)    Muscle aches    Motrin [Ibuprofen] Other (See Comments)    Is to not take this   Rosuvastatin Other (See Comments)    Myalgias   Simvastatin Other (See Comments)    Myalgias   Tape     PAPER TAPE IS PPEFERRED   Latex Rash    EKGs/Labs/Other Studies Reviewed:   The following studies were reviewed today: Cardiac Studies & Procedures       ECHOCARDIOGRAM  ECHOCARDIOGRAM LIMITED 09/24/2022  Narrative ECHOCARDIOGRAM LIMITED REPORT    Patient Name:   [name] Date of Exam: 09/24/2022 Medical Rec #:  [id number]            Height:       64.0 in Accession #:    [id number]           Weight:       236.6 lb Date of Birth:  1940-10-03            BSA:          2.101 m Patient Age:    82 years             BP:            144/79 mmHg Patient Gender: F  HR:           88 bpm. Exam Location:  Inpatient  Procedure: Limited Echo, Cardiac Doppler, Color Doppler and 2D Echo  Indications:    A fib  History:        Patient has prior history of Echocardiogram examinations, most recent 09/19/2022. Arrythmias:Atrial Fibrillation.  Sonographer:    Lucy Antigua Referring Phys: [id number] Cyndi Bender  IMPRESSIONS   1. Left ventricular ejection fraction, by estimation, is 55 to 60%. The left ventricle has normal function. The left ventricle has no regional wall motion abnormalities. 2. Right ventricular systolic function is normal. The right ventricular size is not well visualized. 3. Left atrial size was mildly dilated. 4. The mitral valve is normal in structure. No evidence of mitral stenosis. 5. Aortic valve regurgitation is not visualized. Aortic valve sclerosis/calcification is present, without any evidence of aortic stenosis.  Comparison(s): Prior images reviewed side by side. The left ventricular function has improved. No longer in atrial fibrillation.  FINDINGS Left Ventricle: Left ventricular ejection fraction, by estimation, is 55 to 60%. The left ventricle has normal function. The left ventricle has no regional wall motion abnormalities. The left ventricular internal cavity size was normal in size. There is no left ventricular hypertrophy.  Right Ventricle: The right ventricular size is not well visualized. Right ventricular systolic function is normal.  Left Atrium: Left atrial size was mildly dilated.  Right Atrium: Right atrial size was not well visualized.  Pericardium: There is no evidence of pericardial effusion.  Mitral Valve: The mitral valve is normal in structure. Mild mitral annular calcification. No evidence of mitral valve stenosis.  Tricuspid Valve: The tricuspid valve is not well visualized.  Aortic Valve: Aortic valve regurgitation is not visualized. Aortic valve  sclerosis/calcification is present, without any evidence of aortic stenosis.  Pulmonic Valve: The pulmonic valve was not well visualized.  Aorta: The aortic root and ascending aorta are structurally normal, with no evidence of dilitation.  IAS/Shunts: The interatrial septum was not well visualized.  LEFT VENTRICLE PLAX 2D LVIDd:         4.90 cm LV PW:         1.20 cm LV IVS:        1.00 cm   Rachelle Hora Croitoru MD Electronically signed by Thurmon Fair MD Signature Date/Time: 09/24/2022/5:55:59 PM    Final    MONITORS  LONG TERM MONITOR (3-14 DAYS) 11/26/2018  Narrative  Atrial fibrillation, 100% of the time.  Average HR 86 bpm.  SHe is on anticoagulation for prior DVT.  HR ranges from 44- 146.  Continue current therapy.            EKG:  EKG is not ordered today.   Recent Labs: 09/21/2022: TSH 2.282 09/26/2022: ALT 25; B Natriuretic Peptide 184.5; BUN 22; Creatinine, Ser 0.88; Hemoglobin 14.5; Magnesium 2.2; Platelets 311; Potassium 4.0; Sodium 135  Recent Lipid Panel No results found for: "CHOL", "TRIG", "HDL", "CHOLHDL", "VLDL", "LDLCALC", "LDLDIRECT"  Risk Assessment/Calculations:   CHA2DS2-VASc Score = 4   This indicates a 4.8% annual risk of stroke. The patient's score is based upon: CHF History: 0 HTN History: 1 Diabetes History: 0 Stroke History: 0 Vascular Disease History: 0 Age Score: 2 Gender Score: 1     Home Medications   Current Meds  Medication Sig   acetaminophen (TYLENOL) 325 MG tablet Take 325-650 mg by mouth 2 (two) times daily as needed for mild pain or headache.   acetaminophen (TYLENOL) 500 MG tablet Take  500 mg by mouth at bedtime.   benzocaine (ORAJEL) 10 % mucosal gel Use as directed in the mouth or throat 3 (three) times daily as needed for mouth pain.   calcium carbonate (TUMS - DOSED IN MG ELEMENTAL CALCIUM) 500 MG chewable tablet Chew 1 tablet by mouth 2 (two) times daily as needed for indigestion or heartburn.    Carboxymethylcellulose Sodium (REFRESH LIQUIGEL OP) Place 1 drop into both eyes at bedtime.   diltiazem (TIAZAC) 360 MG 24 hr capsule Take 360 mg by mouth at bedtime.   docusate sodium (COLACE) 100 MG capsule Take 1 capsule (100 mg total) by mouth 2 (two) times daily.   furosemide (LASIX) 40 MG tablet Take 1 tablet (40 mg total) by mouth daily.   irbesartan (AVAPRO) 300 MG tablet Take 1 tablet (300 mg total) by mouth daily.   levothyroxine (SYNTHROID, LEVOTHROID) 100 MCG tablet Take 100 mcg by mouth daily before breakfast.    LORazepam (ATIVAN) 1 MG tablet Take 1.5 mg by mouth See admin instructions. Take 1.5 mg by mouth at bedtime and an additional 1-1.5mg  once a day as needed for anxiety   Lutein-Zeaxanthin 25-5 MG CAPS Take 1 capsule by mouth daily with breakfast.   nebivolol (BYSTOLIC) 5 MG tablet Take 1 tablet (5 mg total) by mouth daily.   OXYGEN Inhale 3.5 L/min into the lungs at bedtime.   PRESCRIPTION MEDICATION CPAP- At bedtime   SIMPLY SALINE NA Place 1 spray into both nostrils as needed (for dryness).   triamcinolone cream (KENALOG) 0.1 % Apply 1 Application topically See admin instructions. Apply to both legs 2 times a day   warfarin (COUMADIN) 5 MG tablet Take 5 mg by mouth at bedtime.     Review of Systems      All other systems reviewed and are otherwise negative except as noted above.  Physical Exam    VS:  BP 122/80   Pulse 65   Ht 5\' 4"  (1.626 m)   Wt 241 lb (109.3 kg)   SpO2 93%   BMI 41.37 kg/m  , BMI Body mass index is 41.37 kg/m.  Wt Readings from Last 3 Encounters:  10/08/22 241 lb (109.3 kg)  09/26/22 228 lb 12.8 oz (103.8 kg)  09/18/22 240 lb (108.9 kg)     GEN: Well nourished, well developed, in no acute distress. HEENT: normal. Neck: Supple, no JVD, carotid bruits, or masses. Cardiac: Irregularly irregular, no murmurs, rubs, or gallops. No clubbing, cyanosis, edema.  Radials/PT 2+ and equal bilaterally.  Respiratory:  Respirations regular and  unlabored, clear to auscultation bilaterally. GI: Soft, nontender, nondistended. MS: No deformity or atrophy. Skin: Warm and dry, red beefy rash present under breasts (and in groin area per patient report) Neuro:  Strength and sensation are intact. Psych: Normal affect.  Assessment & Plan    Diastolic dysfunction/LE edema (chronic) -compression recommended -she has been taking  -potassium was 3.5 per patient report but I cannot see those labs since PCP is no through [facility] -continue lasix and she can take an extra half for LE edema  Yeast rash  -nystatin powder sent to her pharmacy today -encouraged to keep the area clean and dry -she is eating daily yogurt to help build back up her natural GI flora  Permanent atrial fibrillation -continue coumadin, she has an INR draw soon  Hypertension -blood pressure looked better control  Hyperlipidemia -LDL 183 -she will need a redraw next time she is in the clinic  History of PE/DVT -  no recent issues, she is on coumadin         Disposition: Follow up 4 months with Lance Muss, MD or APP.  Signed, Sharlene Dory, PA-C 10/08/2022, 11:07 AM  [facility]

## 2022-10-08 ENCOUNTER — Encounter: Payer: Self-pay | Admitting: Physician Assistant

## 2022-10-08 ENCOUNTER — Ambulatory Visit: Payer: Medicare HMO | Attending: Physician Assistant | Admitting: Physician Assistant

## 2022-10-08 VITALS — BP 122/80 | HR 65 | Ht 64.0 in | Wt 241.0 lb

## 2022-10-08 DIAGNOSIS — H25013 Cortical age-related cataract, bilateral: Secondary | ICD-10-CM | POA: Diagnosis not present

## 2022-10-08 DIAGNOSIS — F419 Anxiety disorder, unspecified: Secondary | ICD-10-CM | POA: Diagnosis not present

## 2022-10-08 DIAGNOSIS — Z79810 Long term (current) use of selective estrogen receptor modulators (SERMs): Secondary | ICD-10-CM | POA: Diagnosis not present

## 2022-10-08 DIAGNOSIS — I5033 Acute on chronic diastolic (congestive) heart failure: Secondary | ICD-10-CM | POA: Diagnosis not present

## 2022-10-08 DIAGNOSIS — R600 Localized edema: Secondary | ICD-10-CM

## 2022-10-08 DIAGNOSIS — D869 Sarcoidosis, unspecified: Secondary | ICD-10-CM | POA: Diagnosis not present

## 2022-10-08 DIAGNOSIS — R0789 Other chest pain: Secondary | ICD-10-CM

## 2022-10-08 DIAGNOSIS — Z86718 Personal history of other venous thrombosis and embolism: Secondary | ICD-10-CM | POA: Diagnosis not present

## 2022-10-08 DIAGNOSIS — I825Y2 Chronic embolism and thrombosis of unspecified deep veins of left proximal lower extremity: Secondary | ICD-10-CM

## 2022-10-08 DIAGNOSIS — Z6841 Body Mass Index (BMI) 40.0 and over, adult: Secondary | ICD-10-CM | POA: Diagnosis not present

## 2022-10-08 DIAGNOSIS — E785 Hyperlipidemia, unspecified: Secondary | ICD-10-CM | POA: Diagnosis not present

## 2022-10-08 DIAGNOSIS — I429 Cardiomyopathy, unspecified: Secondary | ICD-10-CM | POA: Diagnosis not present

## 2022-10-08 DIAGNOSIS — Z9981 Dependence on supplemental oxygen: Secondary | ICD-10-CM | POA: Diagnosis not present

## 2022-10-08 DIAGNOSIS — Z85858 Personal history of malignant neoplasm of other endocrine glands: Secondary | ICD-10-CM | POA: Diagnosis not present

## 2022-10-08 DIAGNOSIS — I480 Paroxysmal atrial fibrillation: Secondary | ICD-10-CM

## 2022-10-08 DIAGNOSIS — I110 Hypertensive heart disease with heart failure: Secondary | ICD-10-CM | POA: Diagnosis not present

## 2022-10-08 DIAGNOSIS — E8809 Other disorders of plasma-protein metabolism, not elsewhere classified: Secondary | ICD-10-CM | POA: Diagnosis not present

## 2022-10-08 DIAGNOSIS — L03115 Cellulitis of right lower limb: Secondary | ICD-10-CM | POA: Diagnosis not present

## 2022-10-08 DIAGNOSIS — G8929 Other chronic pain: Secondary | ICD-10-CM | POA: Diagnosis not present

## 2022-10-08 DIAGNOSIS — Z86711 Personal history of pulmonary embolism: Secondary | ICD-10-CM | POA: Diagnosis not present

## 2022-10-08 DIAGNOSIS — E039 Hypothyroidism, unspecified: Secondary | ICD-10-CM | POA: Diagnosis not present

## 2022-10-08 DIAGNOSIS — I10 Essential (primary) hypertension: Secondary | ICD-10-CM | POA: Diagnosis not present

## 2022-10-08 DIAGNOSIS — M549 Dorsalgia, unspecified: Secondary | ICD-10-CM | POA: Diagnosis not present

## 2022-10-08 DIAGNOSIS — E871 Hypo-osmolality and hyponatremia: Secondary | ICD-10-CM | POA: Diagnosis not present

## 2022-10-08 DIAGNOSIS — A419 Sepsis, unspecified organism: Secondary | ICD-10-CM | POA: Diagnosis not present

## 2022-10-08 DIAGNOSIS — G4733 Obstructive sleep apnea (adult) (pediatric): Secondary | ICD-10-CM | POA: Diagnosis not present

## 2022-10-08 DIAGNOSIS — I4821 Permanent atrial fibrillation: Secondary | ICD-10-CM | POA: Diagnosis not present

## 2022-10-08 DIAGNOSIS — Z86010 Personal history of colonic polyps: Secondary | ICD-10-CM | POA: Diagnosis not present

## 2022-10-08 MED ORDER — NYSTATIN 100000 UNIT/GM EX POWD: 1.0000 | Freq: Three times a day (TID) | CUTANEOUS | 0 refills | Status: DC

## 2022-10-08 NOTE — Patient Instructions (Signed)
Medication Instructions:   YOU CAN TAKE  EXTRA FUROSEMIDE 1/2 TABLET AS NEEDED FOR LEG SWELLING   *If you need a refill on your cardiac medications before your next appointment, please call your pharmacy*   Lab Work:  RETURN FOR LFT AND LIPIDS ON SAME DAY AS FOLLOW UP     If you have labs (blood work) drawn today and your tests are completely normal, you will receive your results only by: MyChart Message (if you have MyChart) OR A paper copy in the mail If you have any lab test that is abnormal or we need to change your treatment, we will call you to review the results.   Testing/Procedures:NONE ORDERED  TODAY    Follow-Up: At [facility], you and your health needs are our priority.  As part of our continuing mission to provide you with exceptional heart care, we have created designated Provider Care Teams.  These Care Teams include your primary Cardiologist (physician) and Advanced Practice Providers (APPs -  Physician Assistants and Nurse Practitioners) who all work together to provide you with the care you need, when you need it.  We recommend signing up for the patient portal called "MyChart".  Sign up information is provided on this After Visit Summary.  MyChart is used to connect with patients for Virtual Visits (Telemedicine).  Patients are able to view lab/test results, encounter notes, upcoming appointments, etc.  Non-urgent messages can be sent to your provider as well.   To learn more about what you can do with MyChart, go to [email or web address].    Your next appointment:    4 month(s)  Provider:    Lance Muss, MD     Other Instructions

## 2022-10-09 ENCOUNTER — Ambulatory Visit: Payer: Self-pay | Admitting: Licensed Clinical Social Worker

## 2022-10-09 NOTE — Patient Outreach (Signed)
  Care Coordination   Initial Visit Note   10/09/2022 Name: [name] MRN: [id number] DOB: [birth year]  [name] is a 82 y.o. year old female who sees Linda Fillers, MD for primary care. I spoke with  [name] by phone today.  What matters to the patients health and wellness today? Symptom Management/Stress Management/Transportation/Med Assistance   Goals Addressed             This Visit's Progress    Obtain Supportive Resources-Transportation/Med Assistance   On track    Activities and task to complete in order to accomplish goals.   Keep all upcoming appointments discussed today Continue with compliance of taking medication prescribed by Doctor Implement healthy coping skills discussed to assist with management of symptoms Continue working with [name] to assist with goals identified Contact preferred pharmacy about medication assistance options for Bystolic         SDOH assessments and interventions completed:  No     Care Coordination Interventions:  Yes, provided  Interventions Today    Flowsheet Row Most Recent Value  Chronic Disease   Chronic disease during today's visit Hypertension (HTN), Atrial Fibrillation (AFib)  General Interventions   General Interventions Discussed/Reviewed General Interventions Discussed, [facility], Doctor Visits  [LCSW introduced self and explained role in care coordination Mclean. Discussed community resources for transporatation and medication assistance programs]  Doctor Visits Discussed/Reviewed Doctor Visits Discussed, PCP  [Pt reviewed recent Hospitalization encounter]  Mental Health Interventions   Mental Health Discussed/Reviewed Mental Health Discussed, Coping Strategies  [Patient has strong support system including family. Practices healthy coping skills to assist with stressors and chronic health conditions]  Nutrition Interventions   Nutrition Discussed/Reviewed Nutrition  Discussed  Pharmacy Interventions   Pharmacy Dicussed/Reviewed Pharmacy Topics Discussed  Safety Interventions   Safety Discussed/Reviewed Safety Discussed       Follow up plan: Follow up call scheduled for 2-4 weeks    Encounter Outcome:  Pt. Visit Completed   [name], MSW, LCSW [facility] Care Management [facility]  [facility] [name] Phone [phone] 11:49 AM

## 2022-10-09 NOTE — Patient Instructions (Signed)
Visit Information  Thank you for taking time to visit with me today. Please don't hesitate to contact me if I can be of assistance to you.   Following are the goals we discussed today:   Goals Addressed             This Visit's Progress    Obtain Supportive Resources-Transportation/Med Assistance   On track    Activities and task to complete in order to accomplish goals.   Keep all upcoming appointments discussed today Continue with compliance of taking medication prescribed by Doctor Implement healthy coping skills discussed to assist with management of symptoms Continue working with [facility] care team to assist with goals identified Contact preferred pharmacy about medication assistance options for Bystolic         Our next appointment is by telephone on 5/23 at 9 AM  Please call the care guide team at [phone] if you need to cancel or reschedule your appointment.   If you are experiencing a Mental Health or Behavioral Health Crisis or need someone to talk to, please call the Suicide and Crisis Lifeline: 988 call 911   Patient verbalizes understanding of instructions and care plan provided today and agrees to view in MyChart. Active MyChart status and patient understanding of how to access instructions and care plan via MyChart confirmed with patient.     [name], MSW, LCSW [facility] Care Management [place]  [facility] [email or web address] Phone [phone] 11:50 AM

## 2022-10-14 ENCOUNTER — Ambulatory Visit
Admission: RE | Admit: 2022-10-14 | Discharge: 2022-10-14 | Disposition: A | Discharge status: Home / Self Care | Payer: Medicare HMO | Source: Ambulatory Visit | Attending: Internal Medicine | Admitting: Internal Medicine

## 2022-10-14 DIAGNOSIS — Z1231 Encounter for screening mammogram for malignant neoplasm of breast: Secondary | ICD-10-CM

## 2022-10-15 DIAGNOSIS — I5033 Acute on chronic diastolic (congestive) heart failure: Secondary | ICD-10-CM | POA: Diagnosis not present

## 2022-10-15 DIAGNOSIS — F419 Anxiety disorder, unspecified: Secondary | ICD-10-CM | POA: Diagnosis not present

## 2022-10-15 DIAGNOSIS — H25013 Cortical age-related cataract, bilateral: Secondary | ICD-10-CM | POA: Diagnosis not present

## 2022-10-15 DIAGNOSIS — M549 Dorsalgia, unspecified: Secondary | ICD-10-CM | POA: Diagnosis not present

## 2022-10-15 DIAGNOSIS — Z85858 Personal history of malignant neoplasm of other endocrine glands: Secondary | ICD-10-CM | POA: Diagnosis not present

## 2022-10-15 DIAGNOSIS — E871 Hypo-osmolality and hyponatremia: Secondary | ICD-10-CM | POA: Diagnosis not present

## 2022-10-15 DIAGNOSIS — Z9981 Dependence on supplemental oxygen: Secondary | ICD-10-CM | POA: Diagnosis not present

## 2022-10-15 DIAGNOSIS — E039 Hypothyroidism, unspecified: Secondary | ICD-10-CM | POA: Diagnosis not present

## 2022-10-15 DIAGNOSIS — E8809 Other disorders of plasma-protein metabolism, not elsewhere classified: Secondary | ICD-10-CM | POA: Diagnosis not present

## 2022-10-15 DIAGNOSIS — G8929 Other chronic pain: Secondary | ICD-10-CM | POA: Diagnosis not present

## 2022-10-15 DIAGNOSIS — Z86711 Personal history of pulmonary embolism: Secondary | ICD-10-CM | POA: Diagnosis not present

## 2022-10-15 DIAGNOSIS — I4821 Permanent atrial fibrillation: Secondary | ICD-10-CM | POA: Diagnosis not present

## 2022-10-15 DIAGNOSIS — A419 Sepsis, unspecified organism: Secondary | ICD-10-CM | POA: Diagnosis not present

## 2022-10-15 DIAGNOSIS — D869 Sarcoidosis, unspecified: Secondary | ICD-10-CM | POA: Diagnosis not present

## 2022-10-15 DIAGNOSIS — I429 Cardiomyopathy, unspecified: Secondary | ICD-10-CM | POA: Diagnosis not present

## 2022-10-15 DIAGNOSIS — G4733 Obstructive sleep apnea (adult) (pediatric): Secondary | ICD-10-CM | POA: Diagnosis not present

## 2022-10-15 DIAGNOSIS — E785 Hyperlipidemia, unspecified: Secondary | ICD-10-CM | POA: Diagnosis not present

## 2022-10-15 DIAGNOSIS — Z79810 Long term (current) use of selective estrogen receptor modulators (SERMs): Secondary | ICD-10-CM | POA: Diagnosis not present

## 2022-10-15 DIAGNOSIS — Z86010 Personal history of colonic polyps: Secondary | ICD-10-CM | POA: Diagnosis not present

## 2022-10-15 DIAGNOSIS — Z86718 Personal history of other venous thrombosis and embolism: Secondary | ICD-10-CM | POA: Diagnosis not present

## 2022-10-15 DIAGNOSIS — Z6841 Body Mass Index (BMI) 40.0 and over, adult: Secondary | ICD-10-CM | POA: Diagnosis not present

## 2022-10-15 DIAGNOSIS — L03115 Cellulitis of right lower limb: Secondary | ICD-10-CM | POA: Diagnosis not present

## 2022-10-15 DIAGNOSIS — I110 Hypertensive heart disease with heart failure: Secondary | ICD-10-CM | POA: Diagnosis not present

## 2022-10-21 DIAGNOSIS — Z86711 Personal history of pulmonary embolism: Secondary | ICD-10-CM | POA: Diagnosis not present

## 2022-10-21 DIAGNOSIS — I80209 Phlebitis and thrombophlebitis of unspecified deep vessels of unspecified lower extremity: Secondary | ICD-10-CM | POA: Diagnosis not present

## 2022-10-21 DIAGNOSIS — Z7901 Long term (current) use of anticoagulants: Secondary | ICD-10-CM | POA: Diagnosis not present

## 2022-10-22 DIAGNOSIS — I429 Cardiomyopathy, unspecified: Secondary | ICD-10-CM | POA: Diagnosis not present

## 2022-10-22 DIAGNOSIS — F419 Anxiety disorder, unspecified: Secondary | ICD-10-CM | POA: Diagnosis not present

## 2022-10-22 DIAGNOSIS — H25013 Cortical age-related cataract, bilateral: Secondary | ICD-10-CM | POA: Diagnosis not present

## 2022-10-22 DIAGNOSIS — I4821 Permanent atrial fibrillation: Secondary | ICD-10-CM | POA: Diagnosis not present

## 2022-10-22 DIAGNOSIS — D869 Sarcoidosis, unspecified: Secondary | ICD-10-CM | POA: Diagnosis not present

## 2022-10-22 DIAGNOSIS — Z86010 Personal history of colonic polyps: Secondary | ICD-10-CM | POA: Diagnosis not present

## 2022-10-22 DIAGNOSIS — G8929 Other chronic pain: Secondary | ICD-10-CM | POA: Diagnosis not present

## 2022-10-22 DIAGNOSIS — Z86711 Personal history of pulmonary embolism: Secondary | ICD-10-CM | POA: Diagnosis not present

## 2022-10-22 DIAGNOSIS — I5033 Acute on chronic diastolic (congestive) heart failure: Secondary | ICD-10-CM | POA: Diagnosis not present

## 2022-10-22 DIAGNOSIS — E785 Hyperlipidemia, unspecified: Secondary | ICD-10-CM | POA: Diagnosis not present

## 2022-10-22 DIAGNOSIS — M549 Dorsalgia, unspecified: Secondary | ICD-10-CM | POA: Diagnosis not present

## 2022-10-22 DIAGNOSIS — A419 Sepsis, unspecified organism: Secondary | ICD-10-CM | POA: Diagnosis not present

## 2022-10-22 DIAGNOSIS — Z6841 Body Mass Index (BMI) 40.0 and over, adult: Secondary | ICD-10-CM | POA: Diagnosis not present

## 2022-10-22 DIAGNOSIS — Z86718 Personal history of other venous thrombosis and embolism: Secondary | ICD-10-CM | POA: Diagnosis not present

## 2022-10-22 DIAGNOSIS — G4733 Obstructive sleep apnea (adult) (pediatric): Secondary | ICD-10-CM | POA: Diagnosis not present

## 2022-10-22 DIAGNOSIS — E039 Hypothyroidism, unspecified: Secondary | ICD-10-CM | POA: Diagnosis not present

## 2022-10-22 DIAGNOSIS — E8809 Other disorders of plasma-protein metabolism, not elsewhere classified: Secondary | ICD-10-CM | POA: Diagnosis not present

## 2022-10-22 DIAGNOSIS — I110 Hypertensive heart disease with heart failure: Secondary | ICD-10-CM | POA: Diagnosis not present

## 2022-10-22 DIAGNOSIS — Z9981 Dependence on supplemental oxygen: Secondary | ICD-10-CM | POA: Diagnosis not present

## 2022-10-22 DIAGNOSIS — Z85858 Personal history of malignant neoplasm of other endocrine glands: Secondary | ICD-10-CM | POA: Diagnosis not present

## 2022-10-22 DIAGNOSIS — Z79810 Long term (current) use of selective estrogen receptor modulators (SERMs): Secondary | ICD-10-CM | POA: Diagnosis not present

## 2022-10-22 DIAGNOSIS — L03115 Cellulitis of right lower limb: Secondary | ICD-10-CM | POA: Diagnosis not present

## 2022-10-22 DIAGNOSIS — E871 Hypo-osmolality and hyponatremia: Secondary | ICD-10-CM | POA: Diagnosis not present

## 2022-10-23 DIAGNOSIS — E782 Mixed hyperlipidemia: Secondary | ICD-10-CM | POA: Diagnosis not present

## 2022-10-29 DIAGNOSIS — A419 Sepsis, unspecified organism: Secondary | ICD-10-CM | POA: Diagnosis not present

## 2022-10-29 DIAGNOSIS — H25013 Cortical age-related cataract, bilateral: Secondary | ICD-10-CM | POA: Diagnosis not present

## 2022-10-29 DIAGNOSIS — L03115 Cellulitis of right lower limb: Secondary | ICD-10-CM | POA: Diagnosis not present

## 2022-10-29 DIAGNOSIS — G8929 Other chronic pain: Secondary | ICD-10-CM | POA: Diagnosis not present

## 2022-10-29 DIAGNOSIS — Z79810 Long term (current) use of selective estrogen receptor modulators (SERMs): Secondary | ICD-10-CM | POA: Diagnosis not present

## 2022-10-29 DIAGNOSIS — Z86718 Personal history of other venous thrombosis and embolism: Secondary | ICD-10-CM | POA: Diagnosis not present

## 2022-10-29 DIAGNOSIS — E785 Hyperlipidemia, unspecified: Secondary | ICD-10-CM | POA: Diagnosis not present

## 2022-10-29 DIAGNOSIS — I110 Hypertensive heart disease with heart failure: Secondary | ICD-10-CM | POA: Diagnosis not present

## 2022-10-29 DIAGNOSIS — D869 Sarcoidosis, unspecified: Secondary | ICD-10-CM | POA: Diagnosis not present

## 2022-10-29 DIAGNOSIS — Z85858 Personal history of malignant neoplasm of other endocrine glands: Secondary | ICD-10-CM | POA: Diagnosis not present

## 2022-10-29 DIAGNOSIS — G4733 Obstructive sleep apnea (adult) (pediatric): Secondary | ICD-10-CM | POA: Diagnosis not present

## 2022-10-29 DIAGNOSIS — E8809 Other disorders of plasma-protein metabolism, not elsewhere classified: Secondary | ICD-10-CM | POA: Diagnosis not present

## 2022-10-29 DIAGNOSIS — Z9981 Dependence on supplemental oxygen: Secondary | ICD-10-CM | POA: Diagnosis not present

## 2022-10-29 DIAGNOSIS — E871 Hypo-osmolality and hyponatremia: Secondary | ICD-10-CM | POA: Diagnosis not present

## 2022-10-29 DIAGNOSIS — I429 Cardiomyopathy, unspecified: Secondary | ICD-10-CM | POA: Diagnosis not present

## 2022-10-29 DIAGNOSIS — Z6841 Body Mass Index (BMI) 40.0 and over, adult: Secondary | ICD-10-CM | POA: Diagnosis not present

## 2022-10-29 DIAGNOSIS — Z86711 Personal history of pulmonary embolism: Secondary | ICD-10-CM | POA: Diagnosis not present

## 2022-10-29 DIAGNOSIS — Z86010 Personal history of colonic polyps: Secondary | ICD-10-CM | POA: Diagnosis not present

## 2022-10-29 DIAGNOSIS — I4821 Permanent atrial fibrillation: Secondary | ICD-10-CM | POA: Diagnosis not present

## 2022-10-29 DIAGNOSIS — F419 Anxiety disorder, unspecified: Secondary | ICD-10-CM | POA: Diagnosis not present

## 2022-10-29 DIAGNOSIS — E039 Hypothyroidism, unspecified: Secondary | ICD-10-CM | POA: Diagnosis not present

## 2022-10-29 DIAGNOSIS — M549 Dorsalgia, unspecified: Secondary | ICD-10-CM | POA: Diagnosis not present

## 2022-10-29 DIAGNOSIS — I5033 Acute on chronic diastolic (congestive) heart failure: Secondary | ICD-10-CM | POA: Diagnosis not present

## 2022-10-30 ENCOUNTER — Ambulatory Visit: Payer: Self-pay | Admitting: Licensed Clinical Social Worker

## 2022-10-30 NOTE — Patient Instructions (Signed)
Visit Information  Thank you for taking time to visit with me today. Please don't hesitate to contact me if I can be of assistance to you.   Following are the goals we discussed today:   Goals Addressed             This Visit's Progress    Obtain Supportive Resources-Transportation/Med Assistance   On track    Activities and task to complete in order to accomplish goals.   Keep all upcoming appointments discussed today Continue with compliance of taking medication prescribed by Doctor Implement healthy coping skills discussed to assist with management of symptoms Continue working with [facility] care team to assist with goals identified Contact preferred pharmacy about medication assistance options for Bystolic         Our next appointment is by telephone on 6/20 at 9  Please call the care guide team at [phone] if you need to cancel or reschedule your appointment.   If you are experiencing a Mental Health or Behavioral Health Crisis or need someone to talk to, please call the Suicide and Crisis Lifeline: 988 call 911   Patient verbalizes understanding of instructions and care plan provided today and agrees to view in MyChart. Active MyChart status and patient understanding of how to access instructions and care plan via MyChart confirmed with patient.     [name], MSW, LCSW [facility] Care Management [place]  [facility] [email or web address] Phone [phone] 6:22 PM

## 2022-10-30 NOTE — Patient Outreach (Signed)
  Care Coordination   Follow Up Visit Note   10/30/2022 Name: [name] MRN: [id number] DOB: [birth year]  [name] is a 82 y.o. year old female who sees Garlan Fillers, MD for primary care. I spoke with  [name] by phone today.  What matters to the patients health and wellness today?  Symptom Management    Goals Addressed             This Visit's Progress    Obtain Supportive Resources-Transportation/Med Assistance   On track    Activities and task to complete in order to accomplish goals.   Keep all upcoming appointments discussed today Continue with compliance of taking medication prescribed by Doctor Implement healthy coping skills discussed to assist with management of symptoms Continue working with [facility] care team to assist with goals identified Contact preferred pharmacy about medication assistance options for Bystolic         SDOH assessments and interventions completed:  No     Care Coordination Interventions:  Yes, provided  Interventions Today    Flowsheet Row Most Recent Value  Chronic Disease   Chronic disease during today's visit Hypertension (HTN), Atrial Fibrillation (AFib)  General Interventions   General Interventions Discussed/Reviewed General Interventions Reviewed, Doctor Visits  [Pt weighs self routinely to assist with managing chronic health conditions. Has been following up with appts and medications]  Doctor Visits Discussed/Reviewed Doctor Visits Reviewed  Exercise Interventions   Exercise Discussed/Reviewed Exercise Reviewed  [Pt completed HH (PT) Continues to participate in home exercises]  Mental Health Interventions   Mental Health Discussed/Reviewed Mental Health Reviewed, Coping Strategies  [Pt reports decrease in stress symptoms]  Nutrition Interventions   Nutrition Discussed/Reviewed Nutrition Reviewed  Pharmacy Interventions   Pharmacy Dicussed/Reviewed Pharmacy Topics Reviewed, Medication  Adherence, Affording Medications  [Patient is obtaining medications for $30 vs $130 with patient assistance through preferred pharmacy. Utilizes pill organizer]  Safety Interventions   Safety Discussed/Reviewed Safety Reviewed       Follow up plan: Follow up call scheduled for 2-4 weeks    Encounter Outcome:  Pt. Visit Completed   [name], MSW, LCSW [facility] Care Management [facility]  [facility] [email or web address] Phone [phone] 6:21 PM

## 2022-11-08 DIAGNOSIS — G4733 Obstructive sleep apnea (adult) (pediatric): Secondary | ICD-10-CM | POA: Diagnosis not present

## 2022-11-26 DIAGNOSIS — I80209 Phlebitis and thrombophlebitis of unspecified deep vessels of unspecified lower extremity: Secondary | ICD-10-CM | POA: Diagnosis not present

## 2022-11-26 DIAGNOSIS — Z86711 Personal history of pulmonary embolism: Secondary | ICD-10-CM | POA: Diagnosis not present

## 2022-11-26 DIAGNOSIS — Z7901 Long term (current) use of anticoagulants: Secondary | ICD-10-CM | POA: Diagnosis not present

## 2022-11-27 ENCOUNTER — Ambulatory Visit: Payer: Self-pay | Admitting: Licensed Clinical Social Worker

## 2022-11-27 NOTE — Patient Instructions (Signed)
Visit Information  Thank you for taking time to visit with me today. Please don't hesitate to contact me if I can be of assistance to you.   Following are the goals we discussed today:   Goals Addressed             This Visit's Progress    Obtain Supportive Resources-Transportation/Med Assistance   On track    Activities and task to complete in order to accomplish goals.   Keep all upcoming appointments discussed today Continue with compliance of taking medication prescribed by Doctor Implement healthy coping skills discussed to assist with management of symptoms Continue working with [facility] care team to assist with goals identified Contact preferred pharmacy about medication assistance options for Bystolic         Our next appointment is by telephone on 08/15 at 11 AM  Please call the care guide team at [phone] if you need to cancel or reschedule your appointment.   If you are experiencing a Mental Health or Behavioral Health Crisis or need someone to talk to, please call the Suicide and Crisis Lifeline: 988 call 911   Patient verbalizes understanding of instructions and care plan provided today and agrees to view in MyChart. Active MyChart status and patient understanding of how to access instructions and care plan via MyChart confirmed with patient.     [name], MSW, LCSW [facility] Care Management [place]  [facility] [email or web address] Phone [phone] 5:47 PM

## 2022-11-27 NOTE — Patient Outreach (Signed)
  Care Coordination   Follow Up Visit Note   11/27/2022 Name: [name] MRN: [id number] DOB: [birth year]  [name] is a 82 y.o. year old female who sees Garlan Fillers, MD for primary care. I spoke with  [name] by phone today.  What matters to the patients health and wellness today?  Symptom Management    Goals Addressed             This Visit's Progress    Obtain Supportive Resources-Transportation/Med Assistance   On track    Activities and task to complete in order to accomplish goals.   Keep all upcoming appointments discussed today Continue with compliance of taking medication prescribed by Doctor Implement healthy coping skills discussed to assist with management of symptoms Continue working with [facility] care team to assist with goals identified Contact preferred pharmacy about medication assistance options for Bystolic         SDOH assessments and interventions completed:  No     Care Coordination Interventions:  Yes, provided  Interventions Today    Flowsheet Row Most Recent Value  Chronic Disease   Chronic disease during today's visit Hypertension (HTN), Atrial Fibrillation (AFib)  General Interventions   General Interventions Discussed/Reviewed General Interventions Reviewed, Doctor Visits  [Pt visited PCP yesterday. States management of health conditions are going well. Continues to elevate feet daily to assist with swelling]  Doctor Visits Discussed/Reviewed Doctor Visits Reviewed  Mental Health Interventions   Mental Health Discussed/Reviewed Mental Health Reviewed  Nutrition Interventions   Nutrition Discussed/Reviewed Nutrition Reviewed  Pharmacy Interventions   Pharmacy Dicussed/Reviewed Pharmacy Topics Reviewed, Medication Adherence  Safety Interventions   Safety Discussed/Reviewed Safety Reviewed       Follow up plan: Follow up call scheduled for 4-6 weeks    Encounter Outcome:  Pt. Visit Completed    [name], MSW, LCSW [facility] Care Management [facility]  [facility] [email or web address] Phone [phone] 5:46 PM

## 2022-12-08 DIAGNOSIS — G4733 Obstructive sleep apnea (adult) (pediatric): Secondary | ICD-10-CM | POA: Diagnosis not present

## 2022-12-21 ENCOUNTER — Ambulatory Visit
Admission: EM | Admit: 2022-12-21 | Discharge: 2022-12-21 | Disposition: A | Discharge status: Home / Self Care | Payer: Medicare HMO | Attending: Family Medicine | Admitting: Family Medicine

## 2022-12-21 ENCOUNTER — Other Ambulatory Visit: Payer: Self-pay

## 2022-12-21 DIAGNOSIS — R509 Fever, unspecified: Secondary | ICD-10-CM | POA: Diagnosis not present

## 2022-12-21 DIAGNOSIS — L03115 Cellulitis of right lower limb: Secondary | ICD-10-CM

## 2022-12-21 MED ORDER — CEFTRIAXONE SODIUM 1 G IJ SOLR
1000.0000 mg | Freq: Once | INTRAMUSCULAR | Status: AC
  Administered 2022-12-21: 1000 mg via INTRAMUSCULAR

## 2022-12-21 MED ORDER — CEPHALEXIN 500 MG PO CAPS: 1000.0000 mg | ORAL_CAPSULE | Freq: Two times a day (BID) | ORAL | 0 refills | Status: AC

## 2022-12-21 NOTE — Discharge Instructions (Addendum)
Advised patient to take medication as directed with food to completion.  Advised patient may take OTC Tylenol 1000 mg every 6 hours for fever (oral temperature greater than 100.3).  Encouraged increase daily water intake to 64 ounces per day while taking these medications.  Advised patient to follow-up for re-check on Wednesday, 12/24/2022 of right leg cellulitis.

## 2022-12-21 NOTE — ED Triage Notes (Signed)
Pt states that she has a red rash on her right leg. X2 days  Pt states that she has been running a fever with the rash as well.

## 2022-12-21 NOTE — ED Provider Notes (Signed)
[facility]    CSN: [id number] Arrival date & time: 12/21/22  0931      History   Chief Complaint Chief Complaint  Patient presents with   Rash    X2 days Red rash on right leg.    HPI [name] is a 82 y.o. female.   HPI very pleasant 82 year old female presents with a rash of right leg for 2 days.  PMH significant for morbid obesity, thyroid cancer, atrial fibrillation, and HTN.  Patient is currently on Coumadin and denies any unusual bleeding.  Past Medical History:  Diagnosis Date   Anxiety    takes Lorazepam daily as needed   Arthritis    Cancer (HCC)    thyroid   Cataracts, bilateral    immature   Chronic back pain    compressed vertebrae,takes Fosamax weekly   Family history of adverse reaction to anesthesia    granddaughter gets very sick and mean   History of blood transfusion    no abnormal reaction noted   History of bronchitis 05/2015   History of colon polyps    benign   Hyperlipidemia    takes Zetia daily   Hypertension    takes Diltiazem and Avapro daily   Hypothyroidism    takes Synthroid daily   Ischemic colitis (HCC)    hx of-many yrs ago   Joint pain    Joint swelling    OSA on CPAP    05-08-12 AHi was 46, RDI  53. titrated to   9 cm water  with 3 cm flex.-nadir 75%   PE (pulmonary embolism)    takes Coumadin daily   Peripheral edema    Pneumonia 1992   hx of   Sarcoidosis    Shortness of breath    Urinary frequency    Weakness    left leg.Numbness and tingling.Has sciatica   Wheeze    occaionally. Not new    Patient Active Problem List   Diagnosis Date Noted   Heart failure with preserved ejection fraction (HCC) 09/26/2022   Permanent atrial fibrillation (HCC) 09/26/2022   Warfarin anticoagulation 09/18/2022   Sepsis (HCC) 09/18/2022   Sepsis due to cellulitis (HCC) 09/18/2022   Cellulitis of right leg 09/18/2022   Chronic intermittent hypoxia with obstructive sleep apnea 09/12/2021   Severe  obstructive sleep apnea-hypopnea syndrome 09/12/2021   Acute medial meniscus tear of right knee 03/31/2018   Acute pain of right knee 01/12/2018   Hypercoagulation syndrome (HCC) 05/24/2014   Hunter's glossitis 05/24/2014   Sarcoidosis 05/24/2014   Hypoxemia 05/24/2014   Primary hypertension 10/13/2013   Nocturnal hypoxia 05/19/2013   Obstructive sleep apnea (adult) (pediatric) 05/19/2013   Obesity 05/19/2013   OSA on CPAP    Adenopathy 05/26/2012   Cough 05/20/2012   OSA (obstructive sleep apnea) 05/20/2012   Pulmonary embolism (HCC) 04/29/2012   DVT (deep venous thrombosis) (HCC) 04/29/2012    Past Surgical History:  Procedure Laterality Date   ABDOMINAL HYSTERECTOMY     APPENDECTOMY     BREAST BIOPSY     biopsies on right x 2   BREAST CYST ASPIRATION Left    CHOLECYSTECTOMY N/A 11/29/2015   Procedure: LAPAROSCOPIC CHOLECYSTECTOMY;  Surgeon: Almond Lint, MD;  Location: [facility] OR;  Service: General;  Laterality: N/A;   COLONOSCOPY     DILATION AND CURETTAGE OF UTERUS     LIVER BIOPSY     MELANOMA EXCISION     removed from left leg   scalene surgery  1972   "trying to find out what was wrong with her lungs"   THYROID SURGERY      OB History   No obstetric history on file.      Home Medications    Prior to Admission medications   Medication Sig Start Date End Date Taking? Authorizing Provider  acetaminophen (TYLENOL) 325 MG tablet Take 325-650 mg by mouth 2 (two) times daily as needed for mild pain or headache.   Yes [provider]  acetaminophen (TYLENOL) 500 MG tablet Take 500 mg by mouth at bedtime.   Yes [provider]  benzocaine (ORAJEL) 10 % mucosal gel Use as directed in the mouth or throat 3 (three) times daily as needed for mouth pain. 09/26/22  Yes Sheikh, Omair Latif, DO  calcium carbonate (TUMS - DOSED IN MG ELEMENTAL CALCIUM) 500 MG chewable tablet Chew 1 tablet by mouth 2 (two) times daily as needed for indigestion or heartburn. 05/09/09   Yes [provider]  Carboxymethylcellulose Sodium (REFRESH LIQUIGEL OP) Place 1 drop into both eyes at bedtime.   Yes [provider]  cephALEXin (KEFLEX) 500 MG capsule Take 2 capsules (1,000 mg total) by mouth 2 (two) times daily for 10 days. 12/21/22 12/31/22 Yes Trevor Iha, FNP  diltiazem (TIAZAC) 360 MG 24 hr capsule Take 360 mg by mouth at bedtime.   Yes [provider]  docusate sodium (COLACE) 100 MG capsule Take 1 capsule (100 mg total) by mouth 2 (two) times daily. 09/26/22  Yes Sheikh, Omair Latif, DO  furosemide (LASIX) 40 MG tablet Take 1 tablet (40 mg total) by mouth daily. 09/27/22  Yes Sheikh, Omair Latif, DO  irbesartan (AVAPRO) 300 MG tablet Take 1 tablet (300 mg total) by mouth daily. 09/27/22  Yes Sheikh, Omair Latif, DO  levothyroxine (SYNTHROID, LEVOTHROID) 100 MCG tablet Take 100 mcg by mouth daily before breakfast.  10/07/14  Yes [provider]  LORazepam (ATIVAN) 1 MG tablet Take 1.5 mg by mouth See admin instructions. Take 1.5 mg by mouth at bedtime and an additional 1-1.5mg  once a day as needed for anxiety   Yes [provider]  Lutein-Zeaxanthin 25-5 MG CAPS Take 1 capsule by mouth daily with breakfast.   Yes [provider]  nebivolol (BYSTOLIC) 5 MG tablet Take 1 tablet (5 mg total) by mouth daily. 09/27/22  Yes Sheikh, Omair Latif, DO  nystatin (MYCOSTATIN/NYSTOP) powder Apply 1 Application topically 3 (three) times daily. 10/08/22  Yes Conte, Tessa N, PA-C  OXYGEN Inhale 3.5 L/min into the lungs at bedtime.   Yes [provider]  PRESCRIPTION MEDICATION CPAP- At bedtime   Yes [provider]  SIMPLY SALINE NA Place 1 spray into both nostrils as needed (for dryness).   Yes [provider]  triamcinolone cream (KENALOG) 0.1 % Apply 1 Application topically See admin instructions. Apply to both legs 2 times a day 04/05/21  Yes [provider]  warfarin (COUMADIN) 5 MG tablet Take 5 mg by  mouth at bedtime. 04/25/15  Yes [provider]  ondansetron (ZOFRAN) 4 MG tablet Take 1 tablet (4 mg total) by mouth every 6 (six) hours as needed for nausea. Patient not taking: Reported on 10/08/2022 09/26/22   Marguerita Merles Latif, DO  oxyCODONE (OXY IR/ROXICODONE) 5 MG immediate release tablet Take 1 tablet (5 mg total) by mouth every 6 (six) hours as needed for moderate pain. Patient not taking: Reported on 10/08/2022 09/26/22   Marguerita Merles Latif, DO  polyethylene glycol (  MIRALAX / GLYCOLAX) 17 g packet Take 17 g by mouth daily as needed for mild constipation. Patient not taking: Reported on 10/08/2022 09/26/22   Merlene Laughter, DO    Family History Family History  Problem Relation Age of Onset   Breast cancer Mother    CVA Mother    Breast cancer Sister    Breast cancer Sister    Heart attack Neg Hx    Sleep apnea Neg Hx     Social History Social History   Tobacco Use   Smoking status: Never   Smokeless tobacco: Never  Vaping Use   Vaping status: Never Used  Substance Use Topics   Alcohol use: No   Drug use: No     Allergies   Codeine, Erythromycin, Tetracyclines & related, Statins, Atenolol, Citalopram hydrobromide, Clindamycin/lincomycin, Coenzyme q10, Losartan potassium, Motrin [ibuprofen], Rosuvastatin, Simvastatin, Tape, and Latex   Review of Systems Review of Systems   Physical Exam Triage Vital Signs ED Triage Vitals  Encounter Vitals Group     BP 12/21/22 0948 131/75     Systolic BP Percentile --      Diastolic BP Percentile --      Pulse Rate 12/21/22 0948 91     Resp 12/21/22 0948 16     Temp 12/21/22 0948 99 F (37.2 C)     Temp Source 12/21/22 0948 Oral     SpO2 12/21/22 0948 95 %     Weight 12/21/22 0945 237 lb (107.5 kg)     Height 12/21/22 0945 5\' 4"  (1.626 m)     Head Circumference --      Peak Flow --      Pain Score 12/21/22 0944 0     Pain Loc --      Pain Education --      Exclude from Growth Chart --    No data  found.  Updated Vital Signs BP 131/75 (BP Location: Left Arm)   Pulse 91   Temp 99 F (37.2 C) (Oral)   Resp 16   Ht 5\' 4"  (1.626 m)   Wt 237 lb (107.5 kg)   SpO2 95%   BMI 40.68 kg/m    Physical Exam Vitals and nursing note reviewed.  Constitutional:      Appearance: Normal appearance. She is obese. She is not ill-appearing.  HENT:     Head: Normocephalic and atraumatic.     Mouth/Throat:     Mouth: Mucous membranes are moist.     Pharynx: Oropharynx is clear.  Eyes:     Extraocular Movements: Extraocular movements intact.     Conjunctiva/sclera: Conjunctivae normal.     Pupils: Pupils are equal, round, and reactive to light.  Cardiovascular:     Rate and Rhythm: Normal rate and regular rhythm.     Pulses: Normal pulses.     Heart sounds: Normal heart sounds.  Pulmonary:     Effort: Pulmonary effort is normal.     Breath sounds: Normal breath sounds. No wheezing, rhonchi or rales.  Musculoskeletal:        General: Normal range of motion.     Cervical back: Normal range of motion and neck supple.  Skin:    General: Skin is warm and dry.     Comments: Right upper/lower leg (medial aspect): Significant area of erythema noted-please see image below  Neurological:     General: No focal deficit present.     Mental Status: She is alert and oriented to person, place,  and time. Mental status is at baseline.  Psychiatric:        Mood and Affect: Mood normal.        Behavior: Behavior normal.      UC Treatments / Results  Labs (all labs ordered are listed, but only abnormal results are displayed) Labs Reviewed - No data to display  EKG   Radiology No results found.  Procedures Procedures (including critical care time)  Medications Ordered in UC Medications  cefTRIAXone (ROCEPHIN) injection 1,000 mg (1,000 mg Intramuscular Given 12/21/22 1036)    Initial Impression / Assessment and Plan / UC Course  I have reviewed the triage vital signs and the nursing  notes.  Pertinent labs & imaging results that were available during my care of the patient were reviewed by me and considered in my medical decision making (see chart for details).     MDM: 1.  Cellulitis of leg, right-IM Rocephin 1 g given in clinic prior to discharge, Rx'd Keflex 1 g twice daily for the next 10 days; 2.  Fever, unspecified-advised patient may take OTC Tylenol 1 g every 6 hours for fever (oral temperature greater than 100.3) Advised patient to take medication as directed with food to completion.  Advised patient may take OTC Tylenol 1000 mg every 6 hours for fever (oral temperature greater than 100.3).  Encouraged increase daily water intake to 64 ounces per day while taking these medications.  Advised patient to follow-up for re-check on Wednesday, 12/24/2022 of right leg cellulitis.  Patient discharged home, hemodynamically stable.  Final Clinical Impressions(s) / UC Diagnoses   Final diagnoses:  Cellulitis of leg, right  Fever, unspecified     Discharge Instructions      Advised patient to take medication as directed with food to completion.  Advised patient may take OTC Tylenol 1000 mg every 6 hours for fever (oral temperature greater than 100.3).  Encouraged increase daily water intake to 64 ounces per day while taking these medications.  Advised patient to follow-up for re-check on Wednesday, 12/24/2022 of right leg cellulitis.     ED Prescriptions     Medication Sig Dispense Auth. Provider   cephALEXin (KEFLEX) 500 MG capsule Take 2 capsules (1,000 mg total) by mouth 2 (two) times daily for 10 days. 40 capsule Trevor Iha, FNP      PDMP not reviewed this encounter.   Trevor Iha, FNP 12/21/22 1128

## 2022-12-24 ENCOUNTER — Encounter: Payer: Self-pay | Admitting: Emergency Medicine

## 2022-12-24 ENCOUNTER — Ambulatory Visit
Admission: EM | Admit: 2022-12-24 | Discharge: 2022-12-24 | Disposition: A | Discharge status: Home / Self Care | Payer: Medicare HMO

## 2022-12-24 DIAGNOSIS — L03115 Cellulitis of right lower limb: Secondary | ICD-10-CM | POA: Diagnosis not present

## 2022-12-24 NOTE — Discharge Instructions (Addendum)
Advised patient to continue previously prescribed Keflex until complete.  Encouraged increase daily water intake to 64 ounces per day while taking this medication.  Advised if symptoms worsen and/or unresolved please follow-up with PCP or here for further evaluation.

## 2022-12-24 NOTE — ED Triage Notes (Signed)
Follow up for leg rash. Patient states its improving, but far from healed.

## 2022-12-24 NOTE — ED Provider Notes (Signed)
[facility]    CSN: [id number] Arrival date & time: 12/24/22  0848      History   Chief Complaint No chief complaint on file.   HPI [name] is a 82 y.o. female.   HPI Very pleasant 82 year old female presents for right leg cellulitis follow-up.  Patient was evaluated by me on Sunday, 12/21/2022 for right leg cellulitis.  Patient reports some noticeable improvement.  PMH significant for thyroid cancer, obesity, PE/DVT, and chronic back pain.  Patient is currently on Coumadin and denies any unusual bleeding.  Past Medical History:  Diagnosis Date   Anxiety    takes Lorazepam daily as needed   Arthritis    Cancer (HCC)    thyroid   Cataracts, bilateral    immature   Chronic back pain    compressed vertebrae,takes Fosamax weekly   Family history of adverse reaction to anesthesia    granddaughter gets very sick and mean   History of blood transfusion    no abnormal reaction noted   History of bronchitis 05/2015   History of colon polyps    benign   Hyperlipidemia    takes Zetia daily   Hypertension    takes Diltiazem and Avapro daily   Hypothyroidism    takes Synthroid daily   Ischemic colitis (HCC)    hx of-many yrs ago   Joint pain    Joint swelling    OSA on CPAP    05-08-12 AHi was 46, RDI  53. titrated to   9 cm water  with 3 cm flex.-nadir 75%   PE (pulmonary embolism)    takes Coumadin daily   Peripheral edema    Pneumonia 1992   hx of   Sarcoidosis    Shortness of breath    Urinary frequency    Weakness    left leg.Numbness and tingling.Has sciatica   Wheeze    occaionally. Not new    Patient Active Problem List   Diagnosis Date Noted   Heart failure with preserved ejection fraction (HCC) 09/26/2022   Permanent atrial fibrillation (HCC) 09/26/2022   Warfarin anticoagulation 09/18/2022   Sepsis (HCC) 09/18/2022   Sepsis due to cellulitis (HCC) 09/18/2022   Cellulitis of right leg 09/18/2022   Chronic intermittent hypoxia  with obstructive sleep apnea 09/12/2021   Severe obstructive sleep apnea-hypopnea syndrome 09/12/2021   Acute medial meniscus tear of right knee 03/31/2018   Acute pain of right knee 01/12/2018   Hypercoagulation syndrome (HCC) 05/24/2014   Hunter's glossitis 05/24/2014   Sarcoidosis 05/24/2014   Hypoxemia 05/24/2014   Primary hypertension 10/13/2013   Nocturnal hypoxia 05/19/2013   Obstructive sleep apnea (adult) (pediatric) 05/19/2013   Obesity 05/19/2013   OSA on CPAP    Adenopathy 05/26/2012   Cough 05/20/2012   OSA (obstructive sleep apnea) 05/20/2012   Pulmonary embolism (HCC) 04/29/2012   DVT (deep venous thrombosis) (HCC) 04/29/2012    Past Surgical History:  Procedure Laterality Date   ABDOMINAL HYSTERECTOMY     APPENDECTOMY     BREAST BIOPSY     biopsies on right x 2   BREAST CYST ASPIRATION Left    CHOLECYSTECTOMY N/A 11/29/2015   Procedure: LAPAROSCOPIC CHOLECYSTECTOMY;  Surgeon: Almond Lint, MD;  Location: [facility];  Service: General;  Laterality: N/A;   COLONOSCOPY     DILATION AND CURETTAGE OF UTERUS     LIVER BIOPSY     MELANOMA EXCISION     removed from left leg   scalene surgery  1972   "trying to find out what was wrong with her lungs"   THYROID SURGERY      OB History   No obstetric history on file.      Home Medications    Prior to Admission medications   Medication Sig Start Date End Date Taking? Authorizing Provider  acetaminophen (TYLENOL) 325 MG tablet Take 325-650 mg by mouth 2 (two) times daily as needed for mild pain or headache.    [provider]  acetaminophen (TYLENOL) 500 MG tablet Take 500 mg by mouth at bedtime.    [provider]  benzocaine (ORAJEL) 10 % mucosal gel Use as directed in the mouth or throat 3 (three) times daily as needed for mouth pain. 09/26/22   Marguerita Merles Latif, DO  calcium carbonate (TUMS - DOSED IN MG ELEMENTAL CALCIUM) 500 MG chewable tablet Chew 1 tablet by mouth 2 (two) times daily as  needed for indigestion or heartburn. 05/09/09   [provider]  Carboxymethylcellulose Sodium (REFRESH LIQUIGEL OP) Place 1 drop into both eyes at bedtime.    [provider]  cephALEXin (KEFLEX) 500 MG capsule Take 2 capsules (1,000 mg total) by mouth 2 (two) times daily for 10 days. 12/21/22 12/31/22  Trevor Iha, FNP  diltiazem (TIAZAC) 360 MG 24 hr capsule Take 360 mg by mouth at bedtime.    [provider]  docusate sodium (COLACE) 100 MG capsule Take 1 capsule (100 mg total) by mouth 2 (two) times daily. 09/26/22   Marguerita Merles Latif, DO  furosemide (LASIX) 40 MG tablet Take 1 tablet (40 mg total) by mouth daily. 09/27/22   Sheikh, Omair Latif, DO  irbesartan (AVAPRO) 300 MG tablet Take 1 tablet (300 mg total) by mouth daily. 09/27/22   Marguerita Merles Latif, DO  levothyroxine (SYNTHROID, LEVOTHROID) 100 MCG tablet Take 100 mcg by mouth daily before breakfast.  10/07/14   [provider]  LORazepam (ATIVAN) 1 MG tablet Take 1.5 mg by mouth See admin instructions. Take 1.5 mg by mouth at bedtime and an additional 1-1.5mg  once a day as needed for anxiety    [provider]  Lutein-Zeaxanthin 25-5 MG CAPS Take 1 capsule by mouth daily with breakfast.    [provider]  nebivolol (BYSTOLIC) 5 MG tablet Take 1 tablet (5 mg total) by mouth daily. 09/27/22   Marguerita Merles Latif, DO  nystatin (MYCOSTATIN/NYSTOP) powder Apply 1 Application topically 3 (three) times daily. 10/08/22   Sharlene Dory, PA-C  ondansetron (ZOFRAN) 4 MG tablet Take 1 tablet (4 mg total) by mouth every 6 (six) hours as needed for nausea. Patient not taking: Reported on 10/08/2022 09/26/22   Marguerita Merles Latif, DO  oxyCODONE (OXY IR/ROXICODONE) 5 MG immediate release tablet Take 1 tablet (5 mg total) by mouth every 6 (six) hours as needed for moderate pain. Patient not taking: Reported on 10/08/2022 09/26/22   Marguerita Merles Latif, DO  OXYGEN Inhale 3.5 L/min into the lungs at bedtime.     [provider]  polyethylene glycol (MIRALAX / GLYCOLAX) 17 g packet Take 17 g by mouth daily as needed for mild constipation. Patient not taking: Reported on 10/08/2022 09/26/22   Marguerita Merles Latif, DO  PRESCRIPTION MEDICATION CPAP- At bedtime    [provider]  SIMPLY SALINE NA Place 1 spray into both nostrils as needed (for dryness).    [provider]  triamcinolone cream (KENALOG) 0.1 % Apply 1 Application topically See admin instructions. Apply to both  legs 2 times a day 04/05/21   [provider]  warfarin (COUMADIN) 5 MG tablet Take 5 mg by mouth at bedtime. 04/25/15   [provider]    Family History Family History  Problem Relation Age of Onset   Breast cancer Mother    CVA Mother    Breast cancer Sister    Breast cancer Sister    Heart attack Neg Hx    Sleep apnea Neg Hx     Social History Social History   Tobacco Use   Smoking status: Never   Smokeless tobacco: Never  Vaping Use   Vaping status: Never Used  Substance Use Topics   Alcohol use: No   Drug use: No     Allergies   Codeine, Erythromycin, Tetracyclines & related, Statins, Atenolol, Citalopram hydrobromide, Clindamycin/lincomycin, Coenzyme q10, Losartan potassium, Motrin [ibuprofen], Rosuvastatin, Simvastatin, Tape, and Latex   Review of Systems Review of Systems  Skin:        Cellulitis of right leg evaluated by me on Sunday, 12/21/2022.  All other systems reviewed and are negative.    Physical Exam Triage Vital Signs ED Triage Vitals  Encounter Vitals Group     BP 12/24/22 0856 (!) 146/82     Systolic BP Percentile --      Diastolic BP Percentile --      Pulse Rate 12/24/22 0856 88     Resp 12/24/22 0856 16     Temp 12/24/22 0856 98.6 F (37 C)     Temp Source 12/24/22 0856 Oral     SpO2 12/24/22 0856 92 %     Weight --      Height --      Head Circumference --      Peak Flow --      Pain Score 12/24/22 0857 0     Pain Loc --       Pain Education --      Exclude from Growth Chart --    No data found.  Updated Vital Signs BP (!) 146/82 (BP Location: Left Arm)   Pulse 88   Temp 98.6 F (37 C) (Oral)   Resp 16   SpO2 92%      Physical Exam Vitals and nursing note reviewed.  Constitutional:      General: She is not in acute distress.    Appearance: Normal appearance. She is normal weight. She is not ill-appearing.  HENT:     Head: Normocephalic and atraumatic.     Mouth/Throat:     Mouth: Mucous membranes are moist.     Pharynx: Oropharynx is clear.  Eyes:     Extraocular Movements: Extraocular movements intact.     Conjunctiva/sclera: Conjunctivae normal.     Pupils: Pupils are equal, round, and reactive to light.  Cardiovascular:     Rate and Rhythm: Normal rate and regular rhythm.     Pulses: Normal pulses.     Heart sounds: Normal heart sounds.  Pulmonary:     Effort: Pulmonary effort is normal.     Breath sounds: Normal breath sounds. No wheezing, rhonchi or rales.  Musculoskeletal:        General: Normal range of motion.     Cervical back: Normal range of motion and neck supple.  Skin:    General: Skin is warm and dry.     Comments: Right lower leg: Erythematous, mildly indurated, appears to be 40% improved from previous visit of Sunday, 12/21/2022.  Neurological:  General: No focal deficit present.     Mental Status: She is alert and oriented to person, place, and time. Mental status is at baseline.  Psychiatric:        Mood and Affect: Mood normal.        Behavior: Behavior normal.        Thought Content: Thought content normal.         UC Treatments / Results  Labs (all labs ordered are listed, but only abnormal results are displayed) Labs Reviewed - No data to display  EKG   Radiology No results found.  Procedures Procedures (including critical care time)  Medications Ordered in UC Medications - No data to display  Initial Impression / Assessment and Plan / UC  Course  I have reviewed the triage vital signs and the nursing notes.  Pertinent labs & imaging results that were available during my care of the patient were reviewed by me and considered in my medical decision making (see chart for details).     MDM: 1.  Cellulitis of leg, right-advised patient to continue previously prescribed Keflex until complete.  Appears to be 40% improved from appearance on Sunday, 12/21/2022. Advised patient to continue previously prescribed Keflex until complete.  Encouraged increase daily water intake to 64 ounces per day while taking this medication.  Advised if symptoms worsen and/or unresolved please follow-up with PCP or here for further evaluation.  Patient discharged home, hemodynamically stable.  Final Clinical Impressions(s) / UC Diagnoses   Final diagnoses:  Cellulitis of leg, right     Discharge Instructions      Advised patient to continue previously prescribed Keflex until complete.  Encouraged increase daily water intake to 64 ounces per day while taking this medication.  Advised if symptoms worsen and/or unresolved please follow-up with PCP or here for further evaluation.    ED Prescriptions   None    PDMP not reviewed this encounter.   Trevor Iha, FNP 12/24/22 0930

## 2022-12-29 ENCOUNTER — Telehealth: Payer: Self-pay

## 2022-12-29 MED ORDER — CEPHALEXIN 500 MG PO CAPS: 1000.0000 mg | ORAL_CAPSULE | Freq: Two times a day (BID) | ORAL | 0 refills | Status: AC

## 2022-12-31 ENCOUNTER — Encounter: Payer: Self-pay | Admitting: Interventional Cardiology

## 2022-12-31 DIAGNOSIS — Z86711 Personal history of pulmonary embolism: Secondary | ICD-10-CM | POA: Diagnosis not present

## 2022-12-31 DIAGNOSIS — I80209 Phlebitis and thrombophlebitis of unspecified deep vessels of unspecified lower extremity: Secondary | ICD-10-CM | POA: Diagnosis not present

## 2022-12-31 DIAGNOSIS — Z7901 Long term (current) use of anticoagulants: Secondary | ICD-10-CM | POA: Diagnosis not present

## 2022-12-31 DIAGNOSIS — D689 Coagulation defect, unspecified: Secondary | ICD-10-CM | POA: Diagnosis not present

## 2023-01-06 DIAGNOSIS — G4733 Obstructive sleep apnea (adult) (pediatric): Secondary | ICD-10-CM | POA: Diagnosis not present

## 2023-01-12 ENCOUNTER — Ambulatory Visit: Payer: Medicare HMO | Admitting: Interventional Cardiology

## 2023-01-12 ENCOUNTER — Encounter: Payer: Self-pay | Admitting: Interventional Cardiology

## 2023-01-12 ENCOUNTER — Ambulatory Visit: Payer: Medicare HMO | Attending: Physician Assistant

## 2023-01-12 VITALS — BP 130/60 | HR 88 | Ht 64.0 in | Wt 247.8 lb

## 2023-01-12 DIAGNOSIS — I10 Essential (primary) hypertension: Secondary | ICD-10-CM

## 2023-01-12 DIAGNOSIS — I825Y2 Chronic embolism and thrombosis of unspecified deep veins of left proximal lower extremity: Secondary | ICD-10-CM | POA: Diagnosis not present

## 2023-01-12 DIAGNOSIS — I480 Paroxysmal atrial fibrillation: Secondary | ICD-10-CM

## 2023-01-12 DIAGNOSIS — R600 Localized edema: Secondary | ICD-10-CM

## 2023-01-12 DIAGNOSIS — Z86718 Personal history of other venous thrombosis and embolism: Secondary | ICD-10-CM

## 2023-01-12 MED ORDER — FUROSEMIDE 40 MG PO TABS: 40.0000 mg | ORAL_TABLET | Freq: Two times a day (BID) | ORAL | 11 refills | Status: DC | PRN

## 2023-01-12 NOTE — Patient Instructions (Signed)
Medication Instructions:  Your physician has recommended you make the following change in your medication:  Furosemide (Lasix) 40 mg by mouth twice daily as needed for swelling, weight gain  *If you need a refill on your cardiac medications before your next appointment, please call your pharmacy*   Lab Work: BMP  If you have labs (blood work) drawn today and your tests are completely normal, you will receive your results only by: MyChart Message (if you have MyChart) OR A paper copy in the mail If you have any lab test that is abnormal or we need to change your treatment, we will call you to review the results.   Testing/Procedures: NONE   Follow-Up: At [facility], you and your health needs are our priority.  As part of our continuing mission to provide you with exceptional heart care, we have created designated Provider Care Teams.  These Care Teams include your primary Cardiologist (physician) and Advanced Practice Providers (APPs -  Physician Assistants and Nurse Practitioners) who all work together to provide you with the care you need, when you need it.   Your next appointment:   6 month(s)  Provider:   Jari Favre, PA-C, Ronie Spies, PA-C, Robin Searing, NP, Jacolyn Reedy, PA-C, Eligha Bridegroom, NP, Tereso Newcomer, PA-C, or Perlie Gold, PA-C

## 2023-01-12 NOTE — Progress Notes (Signed)
Cardiology Office Note   Date:  01/12/2023   ID:  [name], DOB Feb 05, 1941, MRN [id number]  PCP:  Garlan Fillers, MD    No chief complaint on file.  DVT  Wt Readings from Last 3 Encounters:  01/12/23 247 lb 12.8 oz (112.4 kg)  12/21/22 237 lb (107.5 kg)  10/08/22 241 lb (109.3 kg)       History of Present Illness: [name] is a 82 y.o. female who has a h/o of DVT/PE 2008 (DVT was left leg).  SHe saw Dr. Reyes Ivan. SHe was treated with Coumadin for 6 months. No reason found for DVTs. She had a stress test in 4/09 which was negative for ischemia. She exercised for 6:15 seconds. She had IBS but in 4/10, had persistent pain and had blood in her stool while off coumadin, she was diagnosed with ischemic colitis. Later, she states she was diagnosed with polycythemia, which was being managed by Dr. Dalene Carrow. She was also placed on Xarelto, after a CT scan but then switched back to Coumadin.   SHe felt poorly off of brand name Avapro. Has tried losartan and irbesartan. BP was not well controlled  off of Avapro. Cough resolved.    In the past, "She had a skin cancer removed.  She required some ABx at that time. Cholecystectomy done in 6/17.  She Did well.    In the past, She tried amlodipine but had feet swelling.  She has tolerated avapro.   Decreased irbesartan/HCTZ to half tab in 2020 due to low BP readings.   Have not seen her for a few years.  Recently, leg edema is stable.    Hospitalized in 09/2022 for cellulitis and got IV antibiotics. Treated for cellulitis in July 2024.  Cellulitis seems to be recurring. Normal LVEF in 4/24.  Denies : Chest pain. Dizziness.  Nitroglycerin use. Orthopnea. Palpitations. Paroxysmal nocturnal dyspnea. Shortness of breath. Syncope.    Past Medical History:  Diagnosis Date   Anxiety    takes Lorazepam daily as needed   Arthritis    Cancer (HCC)    thyroid   Cataracts, bilateral    immature   Chronic back pain     compressed vertebrae,takes Fosamax weekly   Family history of adverse reaction to anesthesia    granddaughter gets very sick and mean   History of blood transfusion    no abnormal reaction noted   History of bronchitis 05/2015   History of colon polyps    benign   Hyperlipidemia    takes Zetia daily   Hypertension    takes Diltiazem and Avapro daily   Hypothyroidism    takes Synthroid daily   Ischemic colitis (HCC)    hx of-many yrs ago   Joint pain    Joint swelling    OSA on CPAP    05-08-12 AHi was 46, RDI  53. titrated to   9 cm water  with 3 cm flex.-nadir 75%   PE (pulmonary embolism)    takes Coumadin daily   Peripheral edema    Pneumonia 1992   hx of   Sarcoidosis    Shortness of breath    Urinary frequency    Weakness    left leg.Numbness and tingling.Has sciatica   Wheeze    occaionally. Not new    Past Surgical History:  Procedure Laterality Date   ABDOMINAL HYSTERECTOMY     APPENDECTOMY     BREAST BIOPSY     biopsies  on right x 2   BREAST CYST ASPIRATION Left    CHOLECYSTECTOMY N/A 11/29/2015   Procedure: LAPAROSCOPIC CHOLECYSTECTOMY;  Surgeon: Almond Lint, MD;  Location: [facility] OR;  Service: General;  Laterality: N/A;   COLONOSCOPY     DILATION AND CURETTAGE OF UTERUS     LIVER BIOPSY     MELANOMA EXCISION     removed from left leg   scalene surgery  1972   "trying to find out what was wrong with her lungs"   THYROID SURGERY       Current Outpatient Medications  Medication Sig Dispense Refill   acetaminophen (TYLENOL) 325 MG tablet Take 325-650 mg by mouth 2 (two) times daily as needed for mild pain or headache.     Carboxymethylcellulose Sodium (REFRESH LIQUIGEL OP) Place 1 drop into both eyes at bedtime.     diltiazem (TIAZAC) 360 MG 24 hr capsule Take 360 mg by mouth at bedtime.     furosemide (LASIX) 40 MG tablet Take 1 tablet (40 mg total) by mouth daily. 30 tablet 0   irbesartan (AVAPRO) 300 MG tablet Take 1 tablet (300 mg total) by mouth  daily. 30 tablet 0   levothyroxine (SYNTHROID, LEVOTHROID) 100 MCG tablet Take 100 mcg by mouth daily before breakfast.   1   LORazepam (ATIVAN) 1 MG tablet Take 1.5 mg by mouth See admin instructions. Take 1.5 mg by mouth at bedtime and an additional 1-1.5mg  once a day as needed for anxiety     Lutein-Zeaxanthin 25-5 MG CAPS Take 1 capsule by mouth daily with breakfast.     nebivolol (BYSTOLIC) 5 MG tablet Take 1 tablet (5 mg total) by mouth daily. 30 tablet 0   nystatin (MYCOSTATIN/NYSTOP) powder Apply 1 Application topically 3 (three) times daily. 30 g 0   OXYGEN Inhale 3.5 L/min into the lungs at bedtime.     Potassium Chloride ER 20 MEQ TBCR Take 1 tablet by mouth daily.     PRESCRIPTION MEDICATION CPAP- At bedtime     SIMPLY SALINE NA Place 1 spray into both nostrils as needed (for dryness).     triamcinolone cream (KENALOG) 0.1 % Apply 1 Application topically See admin instructions. Apply to both legs 2 times a day     warfarin (COUMADIN) 5 MG tablet Take 5 mg by mouth at bedtime.  2   No current facility-administered medications for this visit.    Allergies:   Codeine, Erythromycin, Tetracyclines & related, Statins, Atenolol, Citalopram hydrobromide, Clindamycin/lincomycin, Coenzyme q10, Losartan potassium, Motrin [ibuprofen], Rosuvastatin, Simvastatin, Tape, and Latex    Social History:  The patient  reports that she has never smoked. She has never used smokeless tobacco. She reports that she does not drink alcohol and does not use drugs.   Family History:  The patient's family history includes Breast cancer in her mother, sister, and sister; CVA in her mother.    ROS:  Please see the history of present illness.   Otherwise, review of systems are positive for persistent leg swelling.   All other systems are reviewed and negative.    PHYSICAL EXAM: VS:  BP 130/60   Pulse 88   Ht 5\' 4"  (1.626 m)   Wt 247 lb 12.8 oz (112.4 kg)   SpO2 96%   BMI 42.53 kg/m  , BMI Body mass index  is 42.53 kg/m. GEN: Well nourished, well developed, in no acute distress HEENT: normal Neck: no JVD, carotid bruits, or masses Cardiac: irregularly irregular; no murmurs, rubs,  or gallops,; bilateral LE edema , R>L Respiratory:  clear to auscultation bilaterally, normal work of breathing GI: soft, nontender, nondistended, + BS, obese MS: no deformity or atrophy Skin: warm and dry, no rash Neuro:  Strength and sensation are intact Psych: euthymic mood, full affect   EKG:   The ekg ordered in 4/24 demonstrates AFib, RVR   Recent Labs: 09/21/2022: TSH 2.282 09/26/2022: ALT 25; B Natriuretic Peptide 184.5; BUN 22; Creatinine, Ser 0.88; Hemoglobin 14.5; Magnesium 2.2; Platelets 311; Potassium 4.0; Sodium 135   Lipid Panel No results found for: "CHOL", "TRIG", "HDL", "CHOLHDL", "VLDL", "LDLCALC", "LDLDIRECT"   Other studies Reviewed: Additional studies/ records that were reviewed today with results demonstrating: hosp records reviewed, LDL 183 in 04/2022.   ASSESSMENT AND PLAN:  AFib: Rate controlled.  Bystolic and diltiazem. Coumadin for stroke prevention.  HTN: The current medical regimen is effective;  continue present plan and medications.  Hydrochlorothiazide stopped due to taking Lasix. Hyperlipidemia: intolerant of statin.  Obesity: Weight increased recently.  Long term issue.  LE edema: Will refill furosemide prescription so that it can be used 40 mg twice a day as needed renal function in April 2024 was normal and potassium was 3.8.  Taking potassium every other day.  Check BMet today. Anticoagulated: Dose has been stable on warfarin.   Current medicines are reviewed at length with the patient today.  The patient concerns regarding her medicines were addressed.  The following changes have been made:  No change  Labs/ tests ordered today include:  No orders of the defined types were placed in this encounter.   Recommend 150 minutes/week of aerobic exercise Low  fat, low carb, high fiber diet recommended  Disposition:   FU in 6 months   Signed, Lance Muss, MD  01/12/2023 9:30 AM    [facility] [address] [place], [place], [place]  [address] Phone: [phone]; Fax: [phone]

## 2023-01-13 ENCOUNTER — Telehealth: Payer: Self-pay

## 2023-01-13 DIAGNOSIS — E7849 Other hyperlipidemia: Secondary | ICD-10-CM

## 2023-01-13 NOTE — Telephone Encounter (Signed)
The patient has been notified of the result and verbalized understanding.  All questions (if any) were answered. Frutoso Schatz, RN 01/13/2023 4:51 PM  Referral has been placed.

## 2023-01-13 NOTE — Telephone Encounter (Signed)
-----   Message from Sharlene Dory sent at 01/13/2023  4:31 PM EDT ----- Ms. Dacey,  Your LDL is still very high. I know statins are an issue for you. Did we discuss repatha when you were here?  Maybe we can set you up with a Pharm D to discuss.  Thanks! Sharlene Dory, PA-C

## 2023-01-22 ENCOUNTER — Ambulatory Visit: Payer: Self-pay | Admitting: Licensed Clinical Social Worker

## 2023-01-23 NOTE — Patient Outreach (Signed)
  Care Coordination   Follow Up Visit Note   01/22/2023 Name: [name] MRN: [id number] DOB: 1941/04/05  [name] is a 82 y.o. year old female who sees Garlan Fillers, MD for primary care. I spoke with  [name] by phone today.  What matters to the patients health and wellness today?  Management of chronic health conditions    Goals Addressed             This Visit's Progress    Obtain Supportive Resources-Transportation/Med Assistance   On track    Activities and task to complete in order to accomplish goals.   Keep all upcoming appointments discussed today Continue with compliance of taking medication prescribed by Doctor Implement healthy coping skills discussed to assist with management of symptoms Continue working with [facility] care team to assist with goals identified         SDOH assessments and interventions completed:  No     Care Coordination Interventions:  Yes, provided  Interventions Today    Flowsheet Row Most Recent Value  Chronic Disease   Chronic disease during today's visit Hypertension (HTN), Atrial Fibrillation (AFib)  General Interventions   General Interventions Discussed/Reviewed General Interventions Reviewed, Doctor Visits  Doctor Visits Discussed/Reviewed Doctor Visits Reviewed  Mental Health Interventions   Mental Health Discussed/Reviewed Mental Health Reviewed, Coping Strategies  Nutrition Interventions   Nutrition Discussed/Reviewed Nutrition Reviewed  Pharmacy Interventions   Pharmacy Dicussed/Reviewed Pharmacy Topics Reviewed, Medication Adherence  Safety Interventions   Safety Discussed/Reviewed Safety Reviewed       Follow up plan: Follow up call scheduled for 6-8 weeks    Encounter Outcome:  Pt. Visit Completed   [name], MSW, LCSW [facility]  [facility] [email or web address] Phone [phone] 7:05 AM

## 2023-01-23 NOTE — Patient Instructions (Signed)
Visit Information  Thank you for taking time to visit with me today. Please don't hesitate to contact me if I can be of assistance to you.   Following are the goals we discussed today:   Goals Addressed             This Visit's Progress    Obtain Supportive Resources-Transportation/Med Assistance   On track    Activities and task to complete in order to accomplish goals.   Keep all upcoming appointments discussed today Continue with compliance of taking medication prescribed by Doctor Implement healthy coping skills discussed to assist with management of symptoms Continue working with [facility] care team to assist with goals identified         Our next appointment is by telephone on 10/10 at 9 AM  Please call the care guide team at [phone] if you need to cancel or reschedule your appointment.   If you are experiencing a Mental Health or Behavioral Health Crisis or need someone to talk to, please call the Suicide and Crisis Lifeline: 988 call 911   Patient verbalizes understanding of instructions and care plan provided today and agrees to view in MyChart. Active MyChart status and patient understanding of how to access instructions and care plan via MyChart confirmed with patient.     [name], MSW, LCSW [facility] Management [place]  [facility] [email or web address] Phone [phone] 7:06 AM

## 2023-01-28 ENCOUNTER — Encounter (INDEPENDENT_AMBULATORY_CARE_PROVIDER_SITE_OTHER): Payer: Medicare HMO | Admitting: Ophthalmology

## 2023-01-28 DIAGNOSIS — H353132 Nonexudative age-related macular degeneration, bilateral, intermediate dry stage: Secondary | ICD-10-CM

## 2023-01-28 DIAGNOSIS — H35033 Hypertensive retinopathy, bilateral: Secondary | ICD-10-CM

## 2023-01-28 DIAGNOSIS — H43813 Vitreous degeneration, bilateral: Secondary | ICD-10-CM | POA: Diagnosis not present

## 2023-01-28 DIAGNOSIS — I10 Essential (primary) hypertension: Secondary | ICD-10-CM | POA: Diagnosis not present

## 2023-01-28 DIAGNOSIS — H33301 Unspecified retinal break, right eye: Secondary | ICD-10-CM

## 2023-02-05 DIAGNOSIS — Z7901 Long term (current) use of anticoagulants: Secondary | ICD-10-CM | POA: Diagnosis not present

## 2023-02-05 DIAGNOSIS — D689 Coagulation defect, unspecified: Secondary | ICD-10-CM | POA: Diagnosis not present

## 2023-02-05 DIAGNOSIS — I80209 Phlebitis and thrombophlebitis of unspecified deep vessels of unspecified lower extremity: Secondary | ICD-10-CM | POA: Diagnosis not present

## 2023-02-06 DIAGNOSIS — G4733 Obstructive sleep apnea (adult) (pediatric): Secondary | ICD-10-CM | POA: Diagnosis not present

## 2023-03-09 DIAGNOSIS — G4733 Obstructive sleep apnea (adult) (pediatric): Secondary | ICD-10-CM | POA: Diagnosis not present

## 2023-03-10 DIAGNOSIS — Z86711 Personal history of pulmonary embolism: Secondary | ICD-10-CM | POA: Diagnosis not present

## 2023-03-10 DIAGNOSIS — Z7901 Long term (current) use of anticoagulants: Secondary | ICD-10-CM | POA: Diagnosis not present

## 2023-03-10 DIAGNOSIS — I80209 Phlebitis and thrombophlebitis of unspecified deep vessels of unspecified lower extremity: Secondary | ICD-10-CM | POA: Diagnosis not present

## 2023-03-19 ENCOUNTER — Ambulatory Visit: Payer: Self-pay | Admitting: Licensed Clinical Social Worker

## 2023-03-20 NOTE — Patient Outreach (Signed)
Care Coordination   Follow Up Visit Note   03/19/2023 Name: [name] MRN: [id number] DOB: [birth year]  [name] is a 82 y.o. year old female who sees Garlan Fillers, MD for primary care. I spoke with  [name] by phone today.  What matters to the patients health and wellness today?  Symptom Management    Goals Addressed             This Visit's Progress    Obtain Supportive Resources-Transportation/Med Assistance   On track    Activities and task to complete in order to accomplish goals.   Keep all upcoming appointments discussed today Continue with compliance of taking medication prescribed by Doctor Implement healthy coping skills discussed to assist with management of symptoms Continue working with [facility] care team to assist with goals identified Schedule f/up appt with Dermatologist         SDOH assessments and interventions completed:  No     Care Coordination Interventions:  Yes, provided  Interventions Today    Flowsheet Row Most Recent Value  Chronic Disease   Chronic disease during today's visit Hypertension (HTN), Atrial Fibrillation (AFib)  General Interventions   General Interventions Discussed/Reviewed General Interventions Reviewed, Doctor Visits  [Pt continues to monitor symptoms with legs, states they flare up every three months and noticing mild symptoms. Agreed to f/up with PCP if symptoms worsens. Noticed warts growing on scalp and face, agreed to f/up with Dermatologist]  Doctor Visits Discussed/Reviewed Doctor Visits Reviewed  Mental Health Interventions   Mental Health Discussed/Reviewed Mental Health Reviewed, Coping Strategies  [Patient reports no symptoms of anxiety at this time. Discussed healthy coping skills to assist with stress managment]  Nutrition Interventions   Nutrition Discussed/Reviewed Nutrition Reviewed  Pharmacy Interventions   Pharmacy Dicussed/Reviewed Pharmacy Topics Reviewed, Medication  Adherence       Follow up plan: Follow up call scheduled for 2-4 weeks    Encounter Outcome:  Patient Visit Completed   [name], MSW, LCSW [facility] Care Management [facility]  [facility] [email or web address] Phone [phone] 5:55 AM

## 2023-03-20 NOTE — Patient Instructions (Signed)
Visit Information  Thank you for taking time to visit with me today. Please don't hesitate to contact me if I can be of assistance to you.   Following are the goals we discussed today:   Goals Addressed             This Visit's Progress    Obtain Supportive Resources-Transportation/Med Assistance   On track    Activities and task to complete in order to accomplish goals.   Keep all upcoming appointments discussed today Continue with compliance of taking medication prescribed by Doctor Implement healthy coping skills discussed to assist with management of symptoms Continue working with [facility] care team to assist with goals identified Schedule f/up appt with Dermatologist         Our next appointment is by telephone on 11/25 at 9 AM  Please call the care guide team at [phone] if you need to cancel or reschedule your appointment.   If you are experiencing a Mental Health or Behavioral Health Crisis or need someone to talk to, please call the Suicide and Crisis Lifeline: 988 call 911   Patient verbalizes understanding of instructions and care plan provided today and agrees to view in MyChart. Active MyChart status and patient understanding of how to access instructions and care plan via MyChart confirmed with patient.     [name], MSW, LCSW [facility] Care Management [place]  [facility] [email or web address] Phone [phone] 5:55 AM

## 2023-04-02 ENCOUNTER — Ambulatory Visit
Admission: EM | Admit: 2023-04-02 | Discharge: 2023-04-02 | Disposition: A | Discharge status: Home / Self Care | Payer: Medicare HMO | Attending: Family Medicine | Admitting: Family Medicine

## 2023-04-02 DIAGNOSIS — L03116 Cellulitis of left lower limb: Secondary | ICD-10-CM

## 2023-04-02 DIAGNOSIS — L03115 Cellulitis of right lower limb: Secondary | ICD-10-CM

## 2023-04-02 MED ORDER — CEPHALEXIN 500 MG PO CAPS: 1000.0000 mg | ORAL_CAPSULE | Freq: Two times a day (BID) | ORAL | 0 refills | Status: AC

## 2023-04-02 NOTE — Discharge Instructions (Addendum)
Advised patient to take medication as directed with food to completion.  Encouraged increase daily water intake to 64 ounces per day while taking these medications.  Advised if symptoms worsen and/or unresolved please follow-up with PCP or here for further evaluation.

## 2023-04-02 NOTE — ED Provider Notes (Signed)
[facility]    CSN: [id number] Arrival date & time: 04/02/23  1537      History   Chief Complaint Chief Complaint  Patient presents with   Skin Ulcer    HPI [name] is a 82 y.o. female.   HPI Very pleasant 82 year old female presents with sores of bilateral legs 4 days.  Patient is accompanied by her husband this afternoon. PMH significant for obesity, PE, ischemic colitis and heart failure with preserved ejection fraction.  Patient is currently on Coumadin and denies any unusual bleeding.  Patient is accompanied by her husband this evening.  Past Medical History:  Diagnosis Date   Anxiety    takes Lorazepam daily as needed   Arthritis    Cancer (HCC)    thyroid   Cataracts, bilateral    immature   Chronic back pain    compressed vertebrae,takes Fosamax weekly   Family history of adverse reaction to anesthesia    granddaughter gets very sick and mean   History of blood transfusion    no abnormal reaction noted   History of bronchitis 05/2015   History of colon polyps    benign   Hyperlipidemia    takes Zetia daily   Hypertension    takes Diltiazem and Avapro daily   Hypothyroidism    takes Synthroid daily   Ischemic colitis (HCC)    hx of-many yrs ago   Joint pain    Joint swelling    OSA on CPAP    05-08-12 AHi was 46, RDI  53. titrated to   9 cm water  with 3 cm flex.-nadir 75%   PE (pulmonary embolism)    takes Coumadin daily   Peripheral edema    Pneumonia 1992   hx of   Sarcoidosis    Shortness of breath    Urinary frequency    Weakness    left leg.Numbness and tingling.Has sciatica   Wheeze    occaionally. Not new    Patient Active Problem List   Diagnosis Date Noted   Heart failure with preserved ejection fraction (HCC) 09/26/2022   Permanent atrial fibrillation (HCC) 09/26/2022   Warfarin anticoagulation 09/18/2022   Sepsis (HCC) 09/18/2022   Sepsis due to cellulitis (HCC) 09/18/2022   Cellulitis of right leg  09/18/2022   Chronic intermittent hypoxia with obstructive sleep apnea 09/12/2021   Severe obstructive sleep apnea-hypopnea syndrome 09/12/2021   Acute medial meniscus tear of right knee 03/31/2018   Acute pain of right knee 01/12/2018   Hypercoagulation syndrome (HCC) 05/24/2014   Hunter's glossitis 05/24/2014   Sarcoidosis 05/24/2014   Hypoxemia 05/24/2014   Primary hypertension 10/13/2013   Nocturnal hypoxia 05/19/2013   Obstructive sleep apnea (adult) (pediatric) 05/19/2013   Obesity 05/19/2013   OSA on CPAP    Adenopathy 05/26/2012   Cough 05/20/2012   OSA (obstructive sleep apnea) 05/20/2012   Pulmonary embolism (HCC) 04/29/2012   DVT (deep venous thrombosis) (HCC) 04/29/2012    Past Surgical History:  Procedure Laterality Date   ABDOMINAL HYSTERECTOMY     APPENDECTOMY     BREAST BIOPSY     biopsies on right x 2   BREAST CYST ASPIRATION Left    CHOLECYSTECTOMY N/A 11/29/2015   Procedure: LAPAROSCOPIC CHOLECYSTECTOMY;  Surgeon: Almond Lint, MD;  Location: [facility] OR;  Service: General;  Laterality: N/A;   COLONOSCOPY     DILATION AND CURETTAGE OF UTERUS     LIVER BIOPSY     MELANOMA EXCISION  removed from left leg   scalene surgery  1972   "trying to find out what was wrong with her lungs"   THYROID SURGERY      OB History   No obstetric history on file.      Home Medications    Prior to Admission medications   Medication Sig Start Date End Date Taking? Authorizing Provider  cephALEXin (KEFLEX) 500 MG capsule Take 2 capsules (1,000 mg total) by mouth 2 (two) times daily for 10 days. 04/02/23 04/12/23 Yes Trevor Iha, FNP  acetaminophen (TYLENOL) 325 MG tablet Take 325-650 mg by mouth 2 (two) times daily as needed for mild pain or headache.    [provider]  Carboxymethylcellulose Sodium (REFRESH LIQUIGEL OP) Place 1 drop into both eyes at bedtime.    [provider]  diltiazem (TIAZAC) 360 MG 24 hr capsule Take 360 mg by mouth at bedtime.     [provider]  furosemide (LASIX) 40 MG tablet Take 1 tablet (40 mg total) by mouth 2 (two) times daily as needed. 01/12/23   Corky Crafts, MD  irbesartan (AVAPRO) 300 MG tablet Take 1 tablet (300 mg total) by mouth daily. 09/27/22   Marguerita Merles Latif, DO  levothyroxine (SYNTHROID, LEVOTHROID) 100 MCG tablet Take 100 mcg by mouth daily before breakfast.  10/07/14   [provider]  LORazepam (ATIVAN) 1 MG tablet Take 1.5 mg by mouth See admin instructions. Take 1.5 mg by mouth at bedtime and an additional 1-1.5mg  once a day as needed for anxiety    [provider]  Lutein-Zeaxanthin 25-5 MG CAPS Take 1 capsule by mouth daily with breakfast.    [provider]  nebivolol (BYSTOLIC) 5 MG tablet Take 1 tablet (5 mg total) by mouth daily. 09/27/22   Marguerita Merles Latif, DO  nystatin (MYCOSTATIN/NYSTOP) powder Apply 1 Application topically 3 (three) times daily. 10/08/22   Sharlene Dory, PA-C  OXYGEN Inhale 3.5 L/min into the lungs at bedtime.    [provider]  Potassium Chloride ER 20 MEQ TBCR Take 1 tablet by mouth daily. 10/06/22   [provider]  PRESCRIPTION MEDICATION CPAP- At bedtime    [provider]  SIMPLY SALINE NA Place 1 spray into both nostrils as needed (for dryness).    [provider]  triamcinolone cream (KENALOG) 0.1 % Apply 1 Application topically See admin instructions. Apply to both legs 2 times a day 04/05/21   [provider]  warfarin (COUMADIN) 5 MG tablet Take 5 mg by mouth at bedtime. 04/25/15   [provider]    Family History Family History  Problem Relation Age of Onset   Breast cancer Mother    CVA Mother    Breast cancer Sister    Breast cancer Sister    Heart attack Neg Hx    Sleep apnea Neg Hx     Social History Social History   Tobacco Use   Smoking status: Never   Smokeless tobacco: Never  Vaping Use   Vaping status: Never Used  Substance Use Topics    Alcohol use: No   Drug use: No     Allergies   Codeine, Erythromycin, Tetracyclines & related, Statins, Atenolol, Citalopram hydrobromide, Clindamycin/lincomycin, Coenzyme q10, Losartan potassium, Motrin [ibuprofen], Rosuvastatin, Simvastatin, Tape, and Latex   Review of Systems Review of Systems  Skin:  Positive for rash.     Physical Exam Triage Vital Signs ED Triage Vitals  Encounter Vitals Group  BP      Systolic BP Percentile      Diastolic BP Percentile      Pulse      Resp      Temp      Temp src      SpO2      Weight      Height      Head Circumference      Peak Flow      Pain Score      Pain Loc      Pain Education      Exclude from Growth Chart    No data found.  Updated Vital Signs BP (!) 179/101 (BP Location: Left Arm)   Pulse (!) 102   Temp 98.3 F (36.8 C) (Oral)   Resp 20   SpO2 96%    Physical Exam Vitals and nursing note reviewed.  Constitutional:      Appearance: Normal appearance. She is normal weight.  HENT:     Head: Normocephalic and atraumatic.     Mouth/Throat:     Mouth: Mucous membranes are moist.     Pharynx: Oropharynx is clear.  Eyes:     Extraocular Movements: Extraocular movements intact.     Conjunctiva/sclera: Conjunctivae normal.     Pupils: Pupils are equal, round, and reactive to light.  Cardiovascular:     Rate and Rhythm: Normal rate and regular rhythm.     Pulses: Normal pulses.     Heart sounds: Normal heart sounds.  Pulmonary:     Effort: Pulmonary effort is normal.     Breath sounds: No wheezing, rhonchi or rales.  Musculoskeletal:        General: Normal range of motion.     Cervical back: Normal range of motion and neck supple.  Skin:    General: Skin is warm and dry.     Comments: Bilateral lower legs: Erythematous, mildly macerated, indurated, fluctuant-please see image below  Neurological:     General: No focal deficit present.     Mental Status: She is alert and oriented to person, place,  and time. Mental status is at baseline.  Psychiatric:        Mood and Affect: Mood normal.        Behavior: Behavior normal.      UC Treatments / Results  Labs (all labs ordered are listed, but only abnormal results are displayed) Labs Reviewed - No data to display  EKG   Radiology No results found.  Procedures Procedures (including critical care time)  Medications Ordered in UC Medications - No data to display  Initial Impression / Assessment and Plan / UC Course  I have reviewed the triage vital signs and the nursing notes.  Pertinent labs & imaging results that were available during my care of the patient were reviewed by me and considered in my medical decision making (see chart for details).     MDM: 1.  Bilateral lower leg cellulitis-Rx'd Keflex 500 mg capsule: Take 2 capsules twice daily x 10 days. Advised patient to take medication as directed with food to completion.  Encouraged increase daily water intake to 64 ounces per day while taking these medications.  Advised if symptoms worsen and/or unresolved please follow-up with PCP or here for further evaluation.  Patient discharged home, hemodynamically stable. Final Clinical Impressions(s) / UC Diagnoses   Final diagnoses:  Bilateral lower leg cellulitis     Discharge Instructions      Advised patient to take  medication as directed with food to completion.  Encouraged increase daily water intake to 64 ounces per day while taking these medications.  Advised if symptoms worsen and/or unresolved please follow-up with PCP or here for further evaluation.     ED Prescriptions     Medication Sig Dispense Auth. Provider   cephALEXin (KEFLEX) 500 MG capsule Take 2 capsules (1,000 mg total) by mouth 2 (two) times daily for 10 days. 40 capsule Trevor Iha, FNP      PDMP not reviewed this encounter.   Trevor Iha, FNP 04/02/23 1651

## 2023-04-02 NOTE — ED Triage Notes (Signed)
Pt with ulcers to bilateral legs. Patient states they are weeping and causing a lot of pain. Patient states she has had this happen before in the past.

## 2023-04-04 IMAGING — MG MM DIGITAL SCREENING BILAT W/ TOMO AND CAD
6 of 10 series · 6 of 30 positions shown · non-contrast
Comparison: Previous exam(s).

CLINICAL DATA: Screening.

EXAM:
DIGITAL SCREENING BILATERAL MAMMOGRAM WITH TOMOSYNTHESIS AND CAD
TECHNIQUE: Bilateral screening digital craniocaudal and mediolateral oblique
mammograms were obtained. Bilateral screening digital breast
tomosynthesis was performed. The images were evaluated with
computer-aided detection.

[R MLO synth-2D]
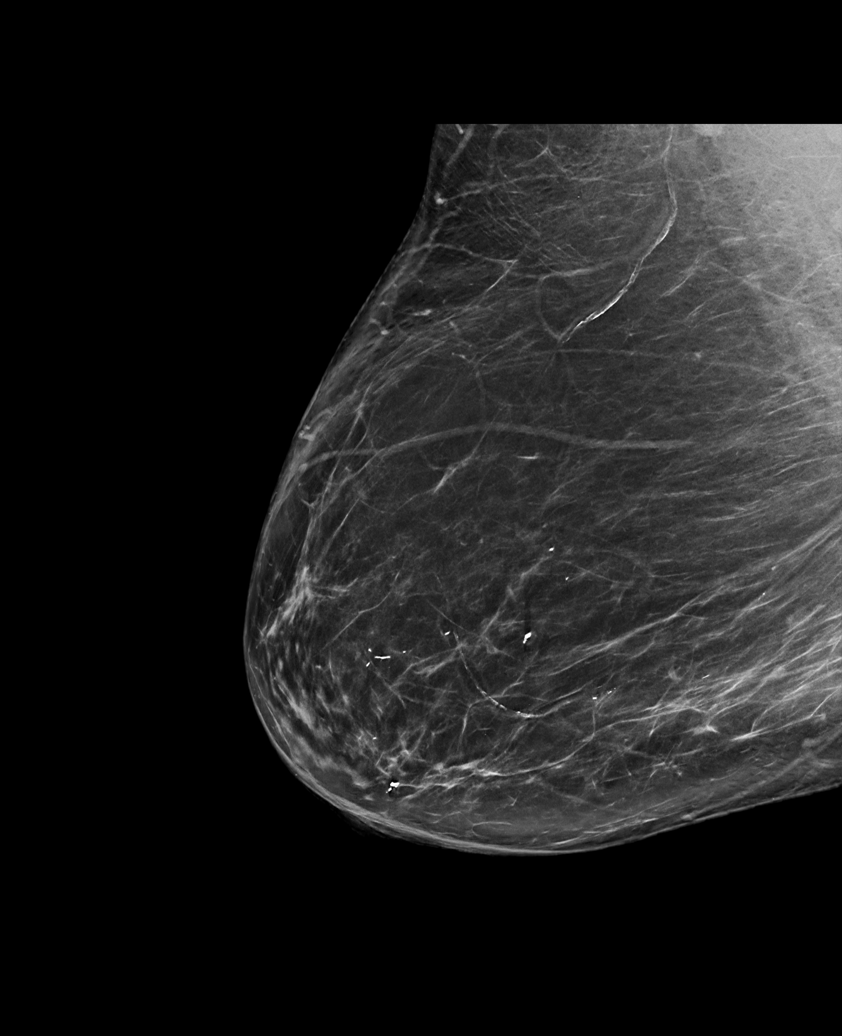

[L CC synth-2D]
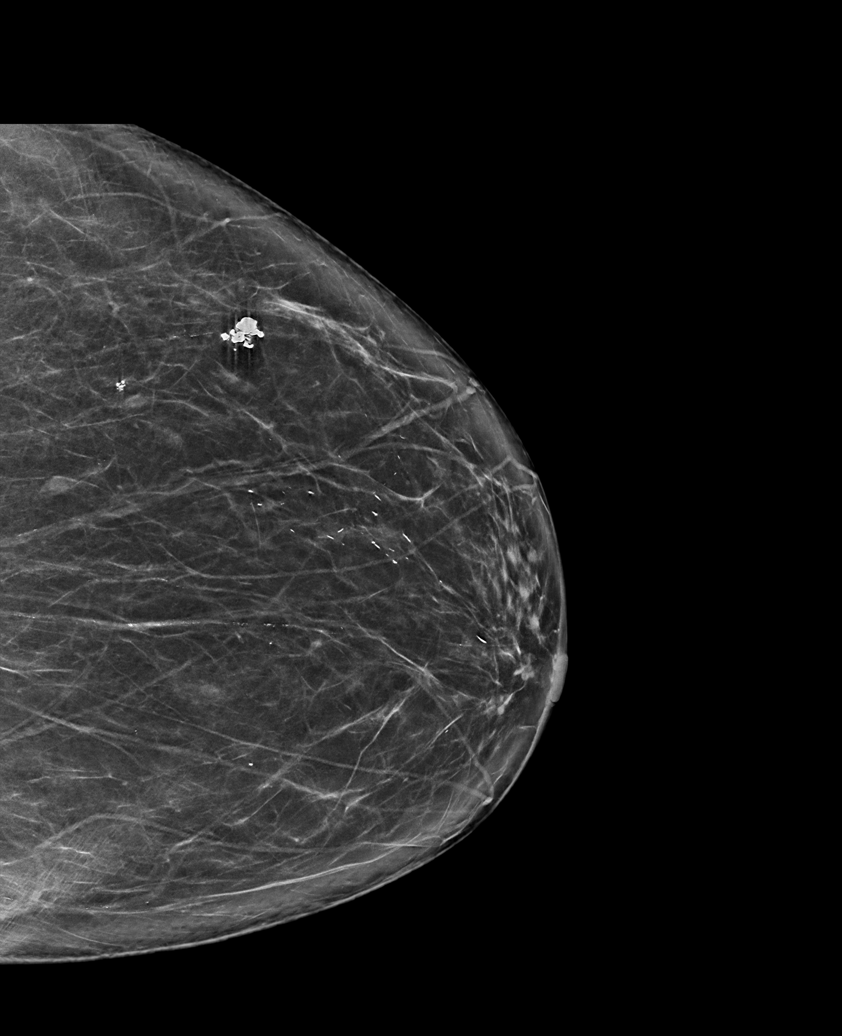

[R CC synth-2D (1 of 2)]
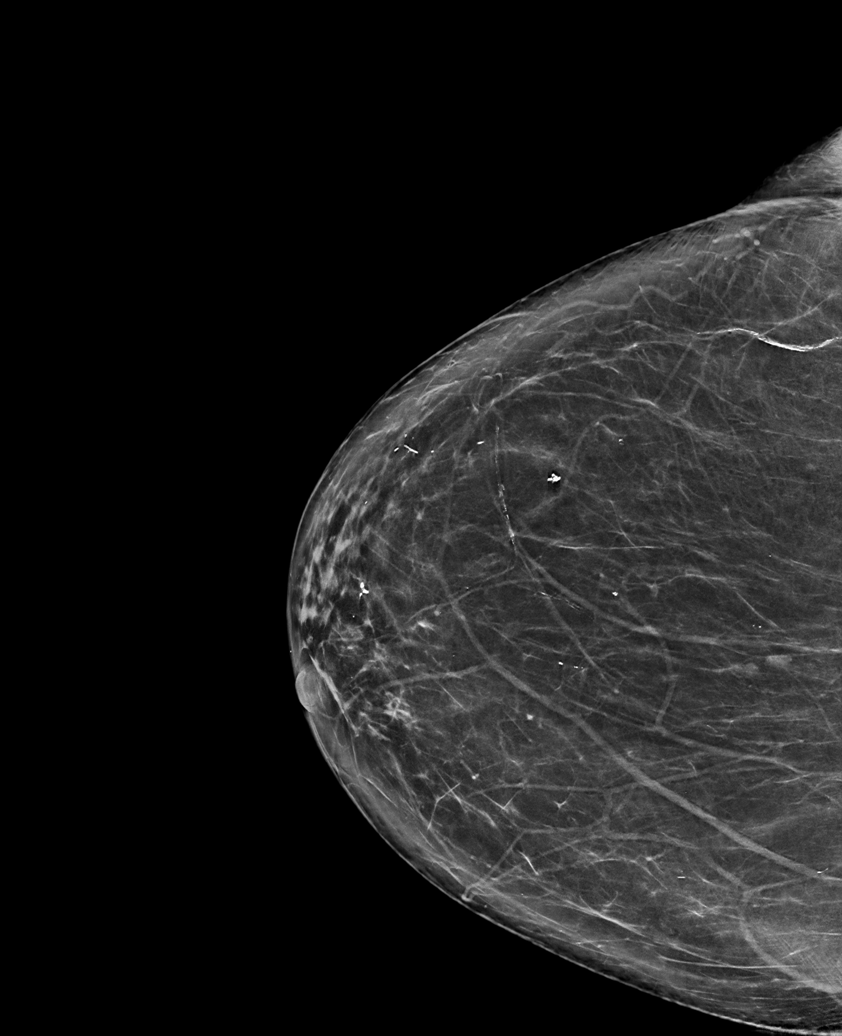

[L MLO synth-2D]
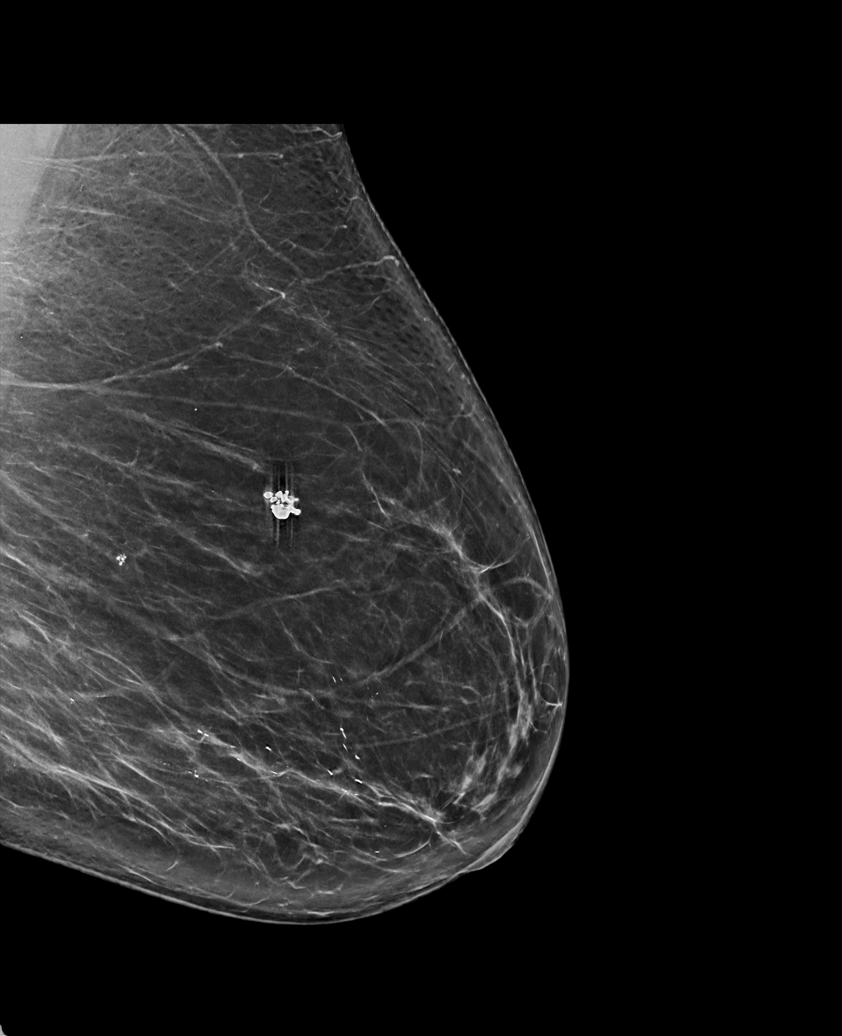

[R CC synth-2D (2 of 2)]
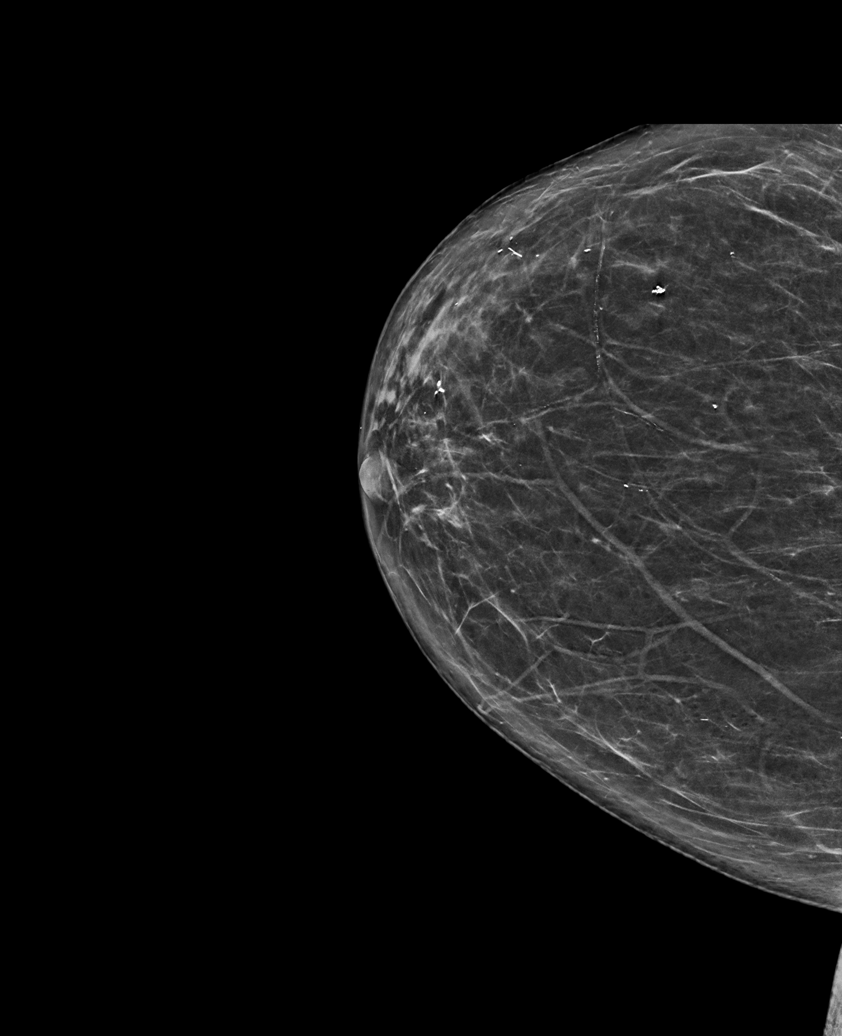

[R CC tomo · tomo slice 37/74.0]
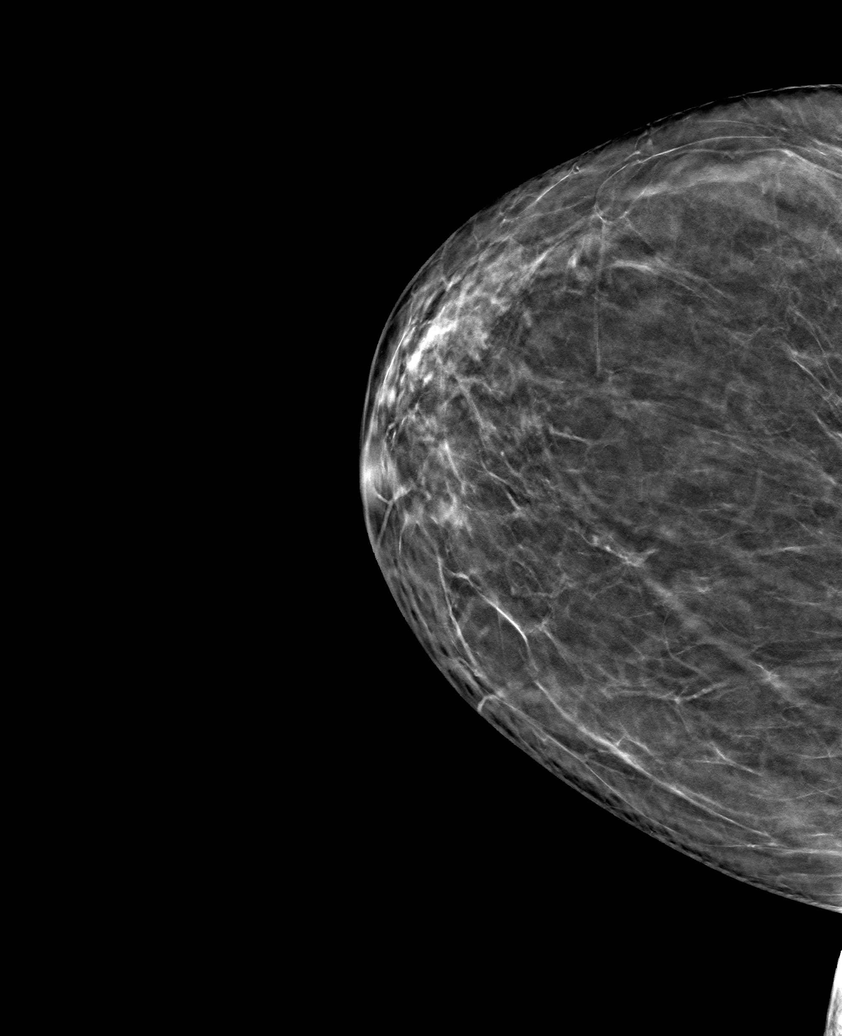

[6 of 30 positions shown; findings below may reference images not displayed]

ACR Breast Density Category b: There are scattered areas of
fibroglandular density.
FINDINGS: There are no findings suspicious for malignancy. The images were
evaluated with computer-aided detection.
IMPRESSION: No mammographic evidence of malignancy. A result letter of this
screening mammogram will be mailed directly to the patient.

RECOMMENDATION:
Screening mammogram in one year. (Code:WJ-I-BG6)

BI-RADS CATEGORY  1: Negative.

## 2023-04-06 DIAGNOSIS — G4733 Obstructive sleep apnea (adult) (pediatric): Secondary | ICD-10-CM | POA: Diagnosis not present

## 2023-04-14 DIAGNOSIS — Z86711 Personal history of pulmonary embolism: Secondary | ICD-10-CM | POA: Diagnosis not present

## 2023-04-14 DIAGNOSIS — Z7901 Long term (current) use of anticoagulants: Secondary | ICD-10-CM | POA: Diagnosis not present

## 2023-04-14 DIAGNOSIS — I80209 Phlebitis and thrombophlebitis of unspecified deep vessels of unspecified lower extremity: Secondary | ICD-10-CM | POA: Diagnosis not present

## 2023-04-29 ENCOUNTER — Ambulatory Visit
Admission: EM | Admit: 2023-04-29 | Discharge: 2023-04-29 | Disposition: A | Discharge status: Home / Self Care | Payer: Medicare HMO | Attending: Family Medicine | Admitting: Family Medicine

## 2023-04-29 DIAGNOSIS — L03116 Cellulitis of left lower limb: Secondary | ICD-10-CM | POA: Diagnosis not present

## 2023-04-29 DIAGNOSIS — M79605 Pain in left leg: Secondary | ICD-10-CM

## 2023-04-29 MED ORDER — CEFTRIAXONE SODIUM 1 G IJ SOLR
1000.0000 mg | Freq: Once | INTRAMUSCULAR | Status: AC
  Administered 2023-04-29: 1000 mg via INTRAMUSCULAR

## 2023-04-29 MED ORDER — CEPHALEXIN 500 MG PO CAPS: 1000.0000 mg | ORAL_CAPSULE | Freq: Two times a day (BID) | ORAL | 0 refills | Status: AC

## 2023-04-29 NOTE — ED Triage Notes (Signed)
Pt c/o LT leg pain and redness since yesterday. Also has been having fevers. Tylenol prn. Pain 8/10

## 2023-04-29 NOTE — Discharge Instructions (Addendum)
Advised patient to take medication as directed with food to completion.  Encouraged increase daily water intake to 64 ounces per day while taking this medication.  Advised if symptoms worsen and/or unresolved please follow-up with PCP or here for further evaluation. 

## 2023-04-29 NOTE — ED Provider Notes (Signed)
[name]    CSN: [id number] Arrival date & time: 04/29/23  0955      History   Chief Complaint Chief Complaint  Patient presents with   Leg Pain    LT, redness    HPI [name] is a 82 y.o. female.   HPI very pleasant 82 year old female presents with left leg pain and redness since yesterday.  Patient has history of cellulitis of bilateral lower legs.  PMH significant for heart failure with preserved ejection fracture, ischemic colitis, and PE (takes warfarin daily and denies any unusual bleeding).  Past Medical History:  Diagnosis Date   Anxiety    takes Lorazepam daily as needed   Arthritis    Cancer (HCC)    thyroid   Cataracts, bilateral    immature   Chronic back pain    compressed vertebrae,takes Fosamax weekly   Family history of adverse reaction to anesthesia    granddaughter gets very sick and mean   History of blood transfusion    no abnormal reaction noted   History of bronchitis 05/2015   History of colon polyps    benign   Hyperlipidemia    takes Zetia daily   Hypertension    takes Diltiazem and Avapro daily   Hypothyroidism    takes Synthroid daily   Ischemic colitis (HCC)    hx of-many yrs ago   Joint pain    Joint swelling    OSA on CPAP    05-08-12 AHi was 46, RDI  53. titrated to   9 cm water  with 3 cm flex.-nadir 75%   PE (pulmonary embolism)    takes Coumadin daily   Peripheral edema    Pneumonia 1992   hx of   Sarcoidosis    Shortness of breath    Urinary frequency    Weakness    left leg.Numbness and tingling.Has sciatica   Wheeze    occaionally. Not new    Patient Active Problem List   Diagnosis Date Noted   Heart failure with preserved ejection fraction (HCC) 09/26/2022   Permanent atrial fibrillation (HCC) 09/26/2022   Warfarin anticoagulation 09/18/2022   Sepsis (HCC) 09/18/2022   Sepsis due to cellulitis (HCC) 09/18/2022   Cellulitis of right leg 09/18/2022   Chronic intermittent hypoxia  with obstructive sleep apnea 09/12/2021   Severe obstructive sleep apnea-hypopnea syndrome 09/12/2021   Acute medial meniscus tear of right knee 03/31/2018   Acute pain of right knee 01/12/2018   Hypercoagulation syndrome (HCC) 05/24/2014   Hunter's glossitis 05/24/2014   Sarcoidosis 05/24/2014   Hypoxemia 05/24/2014   Primary hypertension 10/13/2013   Nocturnal hypoxia 05/19/2013   Obstructive sleep apnea (adult) (pediatric) 05/19/2013   Obesity 05/19/2013   OSA on CPAP    Adenopathy 05/26/2012   Cough 05/20/2012   OSA (obstructive sleep apnea) 05/20/2012   Pulmonary embolism (HCC) 04/29/2012   DVT (deep venous thrombosis) (HCC) 04/29/2012    Past Surgical History:  Procedure Laterality Date   ABDOMINAL HYSTERECTOMY     APPENDECTOMY     BREAST BIOPSY     biopsies on right x 2   BREAST CYST ASPIRATION Left    CHOLECYSTECTOMY N/A 11/29/2015   Procedure: LAPAROSCOPIC CHOLECYSTECTOMY;  Surgeon: Almond Lint, MD;  Location: [facility] OR;  Service: General;  Laterality: N/A;   COLONOSCOPY     DILATION AND CURETTAGE OF UTERUS     LIVER BIOPSY     MELANOMA EXCISION     removed from left leg  scalene surgery  1972   "trying to find out what was wrong with her lungs"   THYROID SURGERY      OB History   No obstetric history on file.      Home Medications    Prior to Admission medications   Medication Sig Start Date End Date Taking? Authorizing Provider  cephALEXin (KEFLEX) 500 MG capsule Take 2 capsules (1,000 mg total) by mouth 2 (two) times daily for 10 days. 04/29/23 05/09/23 Yes Trevor Iha, FNP  acetaminophen (TYLENOL) 325 MG tablet Take 325-650 mg by mouth 2 (two) times daily as needed for mild pain or headache.    [provider]  Carboxymethylcellulose Sodium (REFRESH LIQUIGEL OP) Place 1 drop into both eyes at bedtime.    [provider]  diltiazem (TIAZAC) 360 MG 24 hr capsule Take 360 mg by mouth at bedtime.    [provider]  furosemide  (LASIX) 40 MG tablet Take 1 tablet (40 mg total) by mouth 2 (two) times daily as needed. 01/12/23   Corky Crafts, MD  irbesartan (AVAPRO) 300 MG tablet Take 1 tablet (300 mg total) by mouth daily. 09/27/22   Marguerita Merles Latif, DO  levothyroxine (SYNTHROID, LEVOTHROID) 100 MCG tablet Take 100 mcg by mouth daily before breakfast.  10/07/14   [provider]  LORazepam (ATIVAN) 1 MG tablet Take 1.5 mg by mouth See admin instructions. Take 1.5 mg by mouth at bedtime and an additional 1-1.5mg  once a day as needed for anxiety    [provider]  Lutein-Zeaxanthin 25-5 MG CAPS Take 1 capsule by mouth daily with breakfast.    [provider]  nebivolol (BYSTOLIC) 5 MG tablet Take 1 tablet (5 mg total) by mouth daily. 09/27/22   Marguerita Merles Latif, DO  nystatin (MYCOSTATIN/NYSTOP) powder Apply 1 Application topically 3 (three) times daily. 10/08/22   Sharlene Dory, PA-C  OXYGEN Inhale 3.5 L/min into the lungs at bedtime.    [provider]  Potassium Chloride ER 20 MEQ TBCR Take 1 tablet by mouth daily. 10/06/22   [provider]  PRESCRIPTION MEDICATION CPAP- At bedtime    [provider]  SIMPLY SALINE NA Place 1 spray into both nostrils as needed (for dryness).    [provider]  triamcinolone cream (KENALOG) 0.1 % Apply 1 Application topically See admin instructions. Apply to both legs 2 times a day 04/05/21   [provider]  warfarin (COUMADIN) 5 MG tablet Take 5 mg by mouth at bedtime. 04/25/15   [provider]    Family History Family History  Problem Relation Age of Onset   Breast cancer Mother    CVA Mother    Breast cancer Sister    Breast cancer Sister    Heart attack Neg Hx    Sleep apnea Neg Hx     Social History Social History   Tobacco Use   Smoking status: Never   Smokeless tobacco: Never  Vaping Use   Vaping status: Never Used  Substance Use Topics   Alcohol use: No   Drug use: No      Allergies   Codeine, Erythromycin, Tetracyclines & related, Statins, Atenolol, Citalopram hydrobromide, Clindamycin/lincomycin, Coenzyme q10, Losartan potassium, Motrin [ibuprofen], Rosuvastatin, Simvastatin, Tape, and Latex   Review of Systems Review of Systems  Skin:  Positive for rash.  All other systems reviewed and are negative.    Physical Exam Triage Vital Signs ED Triage Vitals  Encounter Vitals Group  BP 04/29/23 1041 (!) 158/88     Systolic BP Percentile --      Diastolic BP Percentile --      Pulse Rate 04/29/23 1041 95     Resp 04/29/23 1041 17     Temp 04/29/23 1041 98.5 F (36.9 C)     Temp Source 04/29/23 1041 Oral     SpO2 04/29/23 1041 95 %     Weight --      Height --      Head Circumference --      Peak Flow --      Pain Score 04/29/23 1040 8     Pain Loc --      Pain Education --      Exclude from Growth Chart --    No data found.  Updated Vital Signs BP (!) 158/88 (BP Location: Right Arm)   Pulse 95   Temp 98.5 F (36.9 C) (Oral)   Resp 17   SpO2 95%    Physical Exam Vitals and nursing note reviewed.  Constitutional:      Appearance: Normal appearance. She is obese.  HENT:     Head: Normocephalic and atraumatic.     Mouth/Throat:     Mouth: Mucous membranes are moist.     Pharynx: Oropharynx is clear.  Eyes:     Extraocular Movements: Extraocular movements intact.     Conjunctiva/sclera: Conjunctivae normal.     Pupils: Pupils are equal, round, and reactive to light.  Cardiovascular:     Rate and Rhythm: Normal rate and regular rhythm.     Pulses: Normal pulses.     Heart sounds: Normal heart sounds.  Pulmonary:     Effort: Pulmonary effort is normal.     Breath sounds: Normal breath sounds. No wheezing, rhonchi or rales.  Musculoskeletal:        General: Normal range of motion.     Cervical back: Normal range of motion and neck supple.  Skin:    General: Skin is warm and dry.     Comments: Left lower leg medial  aspect): Erythematous with significant soft tissue swelling noted with surrounding erythema-please see image below  Neurological:     General: No focal deficit present.     Mental Status: She is alert and oriented to person, place, and time. Mental status is at baseline.  Psychiatric:        Mood and Affect: Mood normal.        Behavior: Behavior normal.        Thought Content: Thought content normal.      UC Treatments / Results  Labs (all labs ordered are listed, but only abnormal results are displayed) Labs Reviewed - No data to display  EKG   Radiology No results found.  Procedures Procedures (including critical care time)  Medications Ordered in UC Medications  cefTRIAXone (ROCEPHIN) injection 1,000 mg (1,000 mg Intramuscular Given 04/29/23 1144)    Initial Impression / Assessment and Plan / UC Course  I have reviewed the triage vital signs and the nursing notes.  Pertinent labs & imaging results that were available during my care of the patient were reviewed by me and considered in my medical decision making (see chart for details).     MDM: 1.  Cellulitis of leg, left-IM Rocephin 1 g given once and clinic and prior to discharge; 2.  Left leg pain-secondary to cellulitis, Rx'd Keflex 1 g: Take 1 g twice daily x 10 days. Advised patient  to take medication as directed with food to completion.  Encouraged increase daily water intake to 64 ounces per day while taking this medication.  Advised if symptoms worsen and/or unresolved please follow-up with PCP or here for further evaluation.  Patient discharged home, hemodynamically stable. Final Clinical Impressions(s) / UC Diagnoses   Final diagnoses:  Cellulitis of leg, left  Left leg pain     Discharge Instructions      Advised patient to take medication as directed with food to completion.  Encouraged increase daily water intake to 64 ounces per day while taking this medication.  Advised if symptoms worsen and/or  unresolved please follow-up with PCP or here for further evaluation.     ED Prescriptions     Medication Sig Dispense Auth. Provider   cephALEXin (KEFLEX) 500 MG capsule Take 2 capsules (1,000 mg total) by mouth 2 (two) times daily for 10 days. 40 capsule Trevor Iha, FNP      PDMP not reviewed this encounter.   Trevor Iha, FNP 04/29/23 1152

## 2023-05-04 ENCOUNTER — Ambulatory Visit: Payer: Self-pay | Admitting: Licensed Clinical Social Worker

## 2023-05-04 NOTE — Patient Outreach (Signed)
  Care Coordination   Follow Up Visit Note   05/04/2023 Name: [name] MRN: [id number] DOB: February 23, 1941  [name] is a 82 y.o. year old female who sees Linda Fillers, MD for primary care. I spoke with  [name] by phone today.  What matters to the patients health and wellness today? Supportive Resources    Goals Addressed             This Visit's Progress    COMPLETED: Obtain Supportive Resources-Transportation/Med Assistance   On track    Activities and task to complete in order to accomplish goals.   Keep all upcoming appointments discussed today Continue with compliance of taking medication prescribed by Doctor Implement healthy coping skills discussed to assist with management of symptoms Continue working with [facility] care team to assist with goals identified          SDOH assessments and interventions completed:  Yes  SDOH Interventions Today    Flowsheet Row Most Recent Value  SDOH Interventions   Food Insecurity Interventions Intervention Not Indicated  Housing Interventions Intervention Not Indicated  Transportation Interventions Intervention Not Indicated        Care Coordination Interventions:  Yes, provided  Interventions Today    Flowsheet Row Most Recent Value  Chronic Disease   Chronic disease during today's visit Hypertension (HTN), Atrial Fibrillation (AFib), Other  [Cellulitis of leg]  General Interventions   General Interventions Discussed/Reviewed General Interventions Reviewed, Doctor Visits  [Discussed recent ED/Urgent Care visits and strategies discussed with providers to manage chronic health conditions]  Doctor Visits Discussed/Reviewed Doctor Visits Reviewed  Mental Health Interventions   Mental Health Discussed/Reviewed Mental Health Reviewed  [Patient denies any mental health/substance use concerns/needs]  Nutrition Interventions   Nutrition Discussed/Reviewed Nutrition Reviewed  Pharmacy  Interventions   Pharmacy Dicussed/Reviewed Pharmacy Topics Reviewed, Medication Adherence  Safety Interventions   Safety Discussed/Reviewed Safety Reviewed       Follow up plan: No further intervention required. Case closed as PCP office will provide any resource/MH needs that arise  Encounter Outcome:  Patient Visit Completed   [name], MSW, LCSW [facility] Care Management [facility]  [facility] [email or web address] Phone [phone] 9:45 AM

## 2023-05-04 NOTE — Patient Instructions (Signed)
Visit Information  Thank you for taking time to visit with me today. Please don't hesitate to contact me if I can be of assistance to you.   Following are the goals we discussed today:   Goals Addressed             This Visit's Progress    COMPLETED: Obtain Supportive Resources-Transportation/Med Assistance   On track    Activities and task to complete in order to accomplish goals.   Keep all upcoming appointments discussed today Continue with compliance of taking medication prescribed by Doctor Implement healthy coping skills discussed to assist with management of symptoms Continue working with [facility] care team to assist with goals identified           Please call the care guide team at [phone] if you need to cancel or reschedule your appointment.   If you are experiencing a Mental Health or Behavioral Health Crisis or need someone to talk to, please call the Suicide and Crisis Lifeline: 988 call 911   Patient verbalizes understanding of instructions and care plan provided today and agrees to view in MyChart. Active MyChart status and patient understanding of how to access instructions and care plan via MyChart confirmed with patient.   LCSW assisted patient in changing MyChart account password  No further follow up required: PCP office will assist with any complex care coordination needs that may arise. Pt understood  [name], MSW, LCSW [facility] Care Management [place]  [facility] [email or web address] Phone [phone] 9:46 AM

## 2023-05-07 DIAGNOSIS — G4733 Obstructive sleep apnea (adult) (pediatric): Secondary | ICD-10-CM | POA: Diagnosis not present

## 2023-05-19 DIAGNOSIS — D689 Coagulation defect, unspecified: Secondary | ICD-10-CM | POA: Diagnosis not present

## 2023-05-19 DIAGNOSIS — I80209 Phlebitis and thrombophlebitis of unspecified deep vessels of unspecified lower extremity: Secondary | ICD-10-CM | POA: Diagnosis not present

## 2023-05-19 DIAGNOSIS — Z7901 Long term (current) use of anticoagulants: Secondary | ICD-10-CM | POA: Diagnosis not present

## 2023-05-19 DIAGNOSIS — Z86711 Personal history of pulmonary embolism: Secondary | ICD-10-CM | POA: Diagnosis not present

## 2023-06-05 ENCOUNTER — Ambulatory Visit: Payer: Medicare HMO | Admitting: Orthopaedic Surgery

## 2023-06-06 DIAGNOSIS — G4733 Obstructive sleep apnea (adult) (pediatric): Secondary | ICD-10-CM | POA: Diagnosis not present

## 2023-06-11 ENCOUNTER — Encounter: Payer: Self-pay | Admitting: Physician Assistant

## 2023-06-11 ENCOUNTER — Ambulatory Visit: Payer: Medicare HMO | Admitting: Physician Assistant

## 2023-06-11 ENCOUNTER — Other Ambulatory Visit (INDEPENDENT_AMBULATORY_CARE_PROVIDER_SITE_OTHER): Payer: Medicare HMO

## 2023-06-11 ENCOUNTER — Telehealth: Payer: Self-pay

## 2023-06-11 DIAGNOSIS — M25561 Pain in right knee: Secondary | ICD-10-CM

## 2023-06-11 DIAGNOSIS — M170 Bilateral primary osteoarthritis of knee: Secondary | ICD-10-CM

## 2023-06-11 DIAGNOSIS — M25562 Pain in left knee: Secondary | ICD-10-CM

## 2023-06-11 DIAGNOSIS — G8929 Other chronic pain: Secondary | ICD-10-CM | POA: Diagnosis not present

## 2023-06-11 MED ORDER — BUPIVACAINE HCL 0.25 % IJ SOLN
0.6600 mL | INTRAMUSCULAR | Status: AC | PRN
Start: 2023-06-11 — End: 2023-06-11
  Administered 2023-06-11: .66 mL via INTRA_ARTICULAR

## 2023-06-11 MED ORDER — LIDOCAINE HCL 1 % IJ SOLN
3.0000 mL | INTRAMUSCULAR | Status: AC | PRN
Start: 2023-06-11 — End: 2023-06-11
  Administered 2023-06-11: 3 mL

## 2023-06-11 MED ORDER — METHYLPREDNISOLONE ACETATE 40 MG/ML IJ SUSP
13.3300 mg | INTRAMUSCULAR | Status: AC | PRN
  Administered 2023-06-11: 13.33 mg via INTRA_ARTICULAR

## 2023-06-11 NOTE — Telephone Encounter (Signed)
 Please precert for bilateral visco injections. Lindsey's patient. Thanks!

## 2023-06-11 NOTE — Progress Notes (Signed)
 Office Visit Note   Patient: [name]           Date of Birth: [birth year]           MRN: [id number] Visit Date: 06/11/2023              Requested by: [name], MD [address] [place],  [place] [address] PCP: [name], MD   Assessment & Plan: Visit Diagnoses:  1. Bilateral primary osteoarthritis of knee     Plan: Impression is advanced bilateral knee osteoarthritis.  Today, we discussed repeat cortisone injection versus viscosupplementation injection versus total knee arthroplasty.  She is interested in cortisone injections today and would like to also get approval for viscosupplementation injections.  Follow-up once approved her once her symptoms return.  Call with concerns or questions.  Follow-Up Instructions: Return for f/u for visco inj .   Orders:  Orders Placed This Encounter  Procedures   Large Joint Inj: bilateral knee   XR KNEE 3 VIEW LEFT   XR KNEE 3 VIEW RIGHT   No orders of the defined types were placed in this encounter.     Procedures: Large Joint Inj: bilateral knee on 06/11/2023 3:13 PM Indications: pain Details: 22 G needle, anterolateral approach Medications (Right): 0.66 mL bupivacaine  0.25 %; 3 mL lidocaine  1 %; 13.33 mg methylPREDNISolone  acetate 40 MG/ML Medications (Left): 0.66 mL bupivacaine  0.25 %; 3 mL lidocaine  1 %; 13.33 mg methylPREDNISolone  acetate 40 MG/ML      Clinical Data: No additional findings.   Subjective: Chief Complaint  Patient presents with   Left Knee - Pain   Right Knee - Pain    HPI patient is a pleasant 83 year old female who comes in today with recurrent bilateral knee pain both equally as bad.  Left knee hurts on the lateral aspect right knee on the medial aspect.  She describes this as a constant throb worse at night when she is trying to sleep.  She has noted recent locking to the left knee.  She has not taken Tylenol  without significant relief.  She is undergone cortisone  injections in the past with some relief.  No previous viscosupplementation injection.  Review of Systems as detailed in HPI.  All others reviewed and are negative.   Objective: Vital Signs: There were no vitals taken for this visit.  Physical Exam well-developed well-nourished female no acute distress.  Alert and oriented x 3.  Ortho Exam bilateral knee exam: Range of motion 0 to 95 degrees.  Medial and lateral joint line tenderness.  Moderate patellofemoral crepitus.  She is neurovascularly intact distally.  Specialty Comments:  No specialty comments available.  Imaging: XR KNEE 3 VIEW LEFT Result Date: 06/11/2023 Near bone-on-bone medial and patellofemoral compartment  XR KNEE 3 VIEW RIGHT Result Date: 06/11/2023 Near bone-on-bone medial and patellofemoral compartments    PMFS History: Patient Active Problem List   Diagnosis Date Noted   Heart failure with preserved ejection fraction (HCC) 09/26/2022   Permanent atrial fibrillation (HCC) 09/26/2022   Warfarin anticoagulation 09/18/2022   Sepsis (HCC) 09/18/2022   Sepsis due to cellulitis (HCC) 09/18/2022   Cellulitis of right leg 09/18/2022   Chronic intermittent hypoxia with obstructive sleep apnea 09/12/2021   Severe obstructive sleep apnea-hypopnea syndrome 09/12/2021   Acute medial meniscus tear of right knee 03/31/2018   Acute pain of right knee 01/12/2018   Hypercoagulation syndrome (HCC) 05/24/2014   Hunter's glossitis 05/24/2014   Sarcoidosis 05/24/2014   Hypoxemia 05/24/2014  Primary hypertension 10/13/2013   Nocturnal hypoxia 05/19/2013   Obstructive sleep apnea (adult) (pediatric) 05/19/2013   Obesity 05/19/2013   OSA on CPAP    Adenopathy 05/26/2012   Cough 05/20/2012   OSA (obstructive sleep apnea) 05/20/2012   Pulmonary embolism (HCC) 04/29/2012   DVT (deep venous thrombosis) (HCC) 04/29/2012   Past Medical History:  Diagnosis Date   Anxiety    takes Lorazepam  daily as needed   Arthritis     Cancer (HCC)    thyroid    Cataracts, bilateral    immature   Chronic back pain    compressed vertebrae,takes Fosamax weekly   Family history of adverse reaction to anesthesia    granddaughter gets very sick and mean   History of blood transfusion    no abnormal reaction noted   History of bronchitis 05/2015   History of colon polyps    benign   Hyperlipidemia    takes Zetia  daily   Hypertension    takes Diltiazem  and Avapro  daily   Hypothyroidism    takes Synthroid  daily   Ischemic colitis (HCC)    hx of-many yrs ago   Joint pain    Joint swelling    OSA on CPAP    05-08-12 AHi was 46, RDI  53. titrated to   9 cm water  with 3 cm flex.-nadir 75%   PE (pulmonary embolism)    takes Coumadin  daily   Peripheral edema    Pneumonia 1992   hx of   Sarcoidosis    Shortness of breath    Urinary frequency    Weakness    left leg.Numbness and tingling.Has sciatica   Wheeze    occaionally. Not new    Family History  Problem Relation Age of Onset   Breast cancer Mother    CVA Mother    Breast cancer Sister    Breast cancer Sister    Heart attack Neg Hx    Sleep apnea Neg Hx     Past Surgical History:  Procedure Laterality Date   ABDOMINAL HYSTERECTOMY     APPENDECTOMY     BREAST BIOPSY     biopsies on right x 2   BREAST CYST ASPIRATION Left    CHOLECYSTECTOMY N/A 11/29/2015   Procedure: LAPAROSCOPIC CHOLECYSTECTOMY;  Surgeon: Jina Nephew, MD;  Location: [facility] OR;  Service: General;  Laterality: N/A;   COLONOSCOPY     DILATION AND CURETTAGE OF UTERUS     LIVER BIOPSY     MELANOMA EXCISION     removed from left leg   scalene surgery  1972   trying to find out what was wrong with her lungs   THYROID  SURGERY     Social History   Occupational History   Occupation: Retired    Associate Professor: RETIRED  Tobacco Use   Smoking status: Never   Smokeless tobacco: Never  Vaping Use   Vaping status: Never Used  Substance and Sexual Activity   Alcohol  use: No   Drug use: No    Sexual activity: Not on file

## 2023-06-18 DIAGNOSIS — Z86711 Personal history of pulmonary embolism: Secondary | ICD-10-CM | POA: Diagnosis not present

## 2023-06-18 DIAGNOSIS — I80209 Phlebitis and thrombophlebitis of unspecified deep vessels of unspecified lower extremity: Secondary | ICD-10-CM | POA: Diagnosis not present

## 2023-06-18 DIAGNOSIS — Z7901 Long term (current) use of anticoagulants: Secondary | ICD-10-CM | POA: Diagnosis not present

## 2023-06-18 NOTE — Telephone Encounter (Signed)
VOB submitted for Durolane, bilateral knee  

## 2023-07-28 DIAGNOSIS — Z7901 Long term (current) use of anticoagulants: Secondary | ICD-10-CM | POA: Diagnosis not present

## 2023-07-28 DIAGNOSIS — I80209 Phlebitis and thrombophlebitis of unspecified deep vessels of unspecified lower extremity: Secondary | ICD-10-CM | POA: Diagnosis not present

## 2023-07-28 DIAGNOSIS — Z86711 Personal history of pulmonary embolism: Secondary | ICD-10-CM | POA: Diagnosis not present

## 2023-07-28 DIAGNOSIS — D689 Coagulation defect, unspecified: Secondary | ICD-10-CM | POA: Diagnosis not present

## 2023-08-12 ENCOUNTER — Ambulatory Visit
Admission: EM | Admit: 2023-08-12 | Discharge: 2023-08-12 | Disposition: A | Discharge status: Home / Self Care | Attending: Emergency Medicine | Admitting: Emergency Medicine

## 2023-08-12 ENCOUNTER — Encounter: Payer: Self-pay | Admitting: Emergency Medicine

## 2023-08-12 DIAGNOSIS — L03115 Cellulitis of right lower limb: Secondary | ICD-10-CM | POA: Diagnosis not present

## 2023-08-12 MED ORDER — CEPHALEXIN 500 MG PO CAPS: 500.0000 mg | ORAL_CAPSULE | Freq: Two times a day (BID) | ORAL | 0 refills | Status: AC

## 2023-08-12 NOTE — ED Triage Notes (Signed)
 Patient states that she has a history of dermatitis in her legs that flare up from time to time.  This time it has flared up in her right leg and she started running a fever x 2 days of 100.9.  She is taking Tylenol.  The leg is red, swollen and wheeping in areas.

## 2023-08-12 NOTE — Discharge Instructions (Addendum)
 Keflex -- antibiotic to treat infection. Take twice daily for 7 days. Take with food to avoid upset stomach.  Please call the wound care center to make an appointment as soon as possible. I have also placed a referral for you

## 2023-08-12 NOTE — ED Provider Notes (Signed)
 [facility]    CSN: [id number] Arrival date & time: 08/12/23  1226      History   Chief Complaint Chief Complaint  Patient presents with   Leg Pain    HPI [name] is a 83 y.o. female.  Here with concerns of right leg redness, warmth, swelling x 2 days Reports history of cellulitis and frequent flares Subjective fever of 100.9  Using tylenol   On chart review she has history of dermatitis, phlebitis, and cellulitis, seen frequently, most recently in January   Also on Eliquis daily due to PE history   Past Medical History:  Diagnosis Date   Anxiety    takes Lorazepam daily as needed   Arthritis    Cancer (HCC)    thyroid   Cataracts, bilateral    immature   Chronic back pain    compressed vertebrae,takes Fosamax weekly   Family history of adverse reaction to anesthesia    granddaughter gets very sick and mean   History of blood transfusion    no abnormal reaction noted   History of bronchitis 05/2015   History of colon polyps    benign   Hyperlipidemia    takes Zetia daily   Hypertension    takes Diltiazem and Avapro daily   Hypothyroidism    takes Synthroid daily   Ischemic colitis (HCC)    hx of-many yrs ago   Joint pain    Joint swelling    OSA on CPAP    05-08-12 AHi was 46, RDI  53. titrated to   9 cm water  with 3 cm flex.-nadir 75%   PE (pulmonary embolism)    takes Coumadin daily   Peripheral edema    Pneumonia 1992   hx of   Sarcoidosis    Shortness of breath    Urinary frequency    Weakness    left leg.Numbness and tingling.Has sciatica   Wheeze    occaionally. Not new    Patient Active Problem List   Diagnosis Date Noted   Heart failure with preserved ejection fraction (HCC) 09/26/2022   Permanent atrial fibrillation (HCC) 09/26/2022   Warfarin anticoagulation 09/18/2022   Sepsis (HCC) 09/18/2022   Sepsis due to cellulitis (HCC) 09/18/2022   Cellulitis of right leg 09/18/2022   Chronic intermittent hypoxia  with obstructive sleep apnea 09/12/2021   Severe obstructive sleep apnea-hypopnea syndrome 09/12/2021   Acute medial meniscus tear of right knee 03/31/2018   Acute pain of right knee 01/12/2018   Hypercoagulation syndrome (HCC) 05/24/2014   Hunter's glossitis 05/24/2014   Sarcoidosis 05/24/2014   Hypoxemia 05/24/2014   Primary hypertension 10/13/2013   Nocturnal hypoxia 05/19/2013   Obstructive sleep apnea (adult) (pediatric) 05/19/2013   Obesity 05/19/2013   OSA on CPAP    Adenopathy 05/26/2012   Cough 05/20/2012   OSA (obstructive sleep apnea) 05/20/2012   Pulmonary embolism (HCC) 04/29/2012   DVT (deep venous thrombosis) (HCC) 04/29/2012    Past Surgical History:  Procedure Laterality Date   ABDOMINAL HYSTERECTOMY     APPENDECTOMY     BREAST BIOPSY     biopsies on right x 2   BREAST CYST ASPIRATION Left    CHOLECYSTECTOMY N/A 11/29/2015   Procedure: LAPAROSCOPIC CHOLECYSTECTOMY;  Surgeon: Almond Lint, MD;  Location: [facility] OR;  Service: General;  Laterality: N/A;   COLONOSCOPY     DILATION AND CURETTAGE OF UTERUS     LIVER BIOPSY     MELANOMA EXCISION  removed from left leg   scalene surgery  1972   "trying to find out what was wrong with her lungs"   THYROID SURGERY      OB History   No obstetric history on file.      Home Medications    Prior to Admission medications   Medication Sig Start Date End Date Taking? Authorizing Provider  acetaminophen (TYLENOL) 325 MG tablet Take 325-650 mg by mouth 2 (two) times daily as needed for mild pain or headache.   Yes [provider]  Carboxymethylcellulose Sodium (REFRESH LIQUIGEL OP) Place 1 drop into both eyes at bedtime.   Yes [provider]  cephALEXin (KEFLEX) 500 MG capsule Take 1 capsule (500 mg total) by mouth 2 (two) times daily for 7 days. 08/12/23 08/19/23 Yes Braheem Tomasik, Lurena Joiner, PA-C  diltiazem (TIAZAC) 360 MG 24 hr capsule Take 360 mg by mouth at bedtime.   Yes [provider]   furosemide (LASIX) 40 MG tablet Take 1 tablet (40 mg total) by mouth 2 (two) times daily as needed. 01/12/23  Yes Corky Crafts, MD  irbesartan (AVAPRO) 300 MG tablet Take 1 tablet (300 mg total) by mouth daily. 09/27/22  Yes Sheikh, Omair Latif, DO  levothyroxine (SYNTHROID, LEVOTHROID) 100 MCG tablet Take 100 mcg by mouth daily before breakfast.  10/07/14  Yes [provider]  LORazepam (ATIVAN) 1 MG tablet Take 1.5 mg by mouth See admin instructions. Take 1.5 mg by mouth at bedtime and an additional 1-1.5mg  once a day as needed for anxiety   Yes [provider]  Lutein-Zeaxanthin 25-5 MG CAPS Take 1 capsule by mouth daily with breakfast.   Yes [provider]  nebivolol (BYSTOLIC) 5 MG tablet Take 1 tablet (5 mg total) by mouth daily. 09/27/22  Yes Sheikh, Omair Latif, DO  nystatin (MYCOSTATIN/NYSTOP) powder Apply 1 Application topically 3 (three) times daily. 10/08/22  Yes Conte, Tessa N, PA-C  OXYGEN Inhale 3.5 L/min into the lungs at bedtime.   Yes [provider]  Potassium Chloride ER 20 MEQ TBCR Take 1 tablet by mouth daily. 10/06/22  Yes [provider]  PRESCRIPTION MEDICATION CPAP- At bedtime   Yes [provider]  SIMPLY SALINE NA Place 1 spray into both nostrils as needed (for dryness).   Yes [provider]  triamcinolone cream (KENALOG) 0.1 % Apply 1 Application topically See admin instructions. Apply to both legs 2 times a day 04/05/21  Yes [provider]  warfarin (COUMADIN) 5 MG tablet Take 5 mg by mouth at bedtime. 04/25/15  Yes [provider]    Family History Family History  Problem Relation Age of Onset   Breast cancer Mother    CVA Mother    Breast cancer Sister    Breast cancer Sister    Heart attack Neg Hx    Sleep apnea Neg Hx     Social History Social History   Tobacco Use   Smoking status: Never   Smokeless tobacco: Never  Vaping Use   Vaping status: Never Used  Substance  Use Topics   Alcohol use: No   Drug use: No     Allergies   Codeine, Erythromycin, Tetracyclines & related, Statins, Atenolol, Citalopram hydrobromide, Clindamycin/lincomycin, Coenzyme q10, Losartan potassium, Motrin [ibuprofen], Rosuvastatin, Simvastatin, Tape, and Latex   Review of Systems Review of Systems Per HPI  Physical Exam Triage Vital Signs ED Triage Vitals  Encounter Vitals Group     BP 08/12/23 1247 (!) 155/70  Systolic BP Percentile --      Diastolic BP Percentile --      Pulse Rate 08/12/23 1247 87     Resp 08/12/23 1247 18     Temp 08/12/23 1247 98 F (36.7 C)     Temp Source 08/12/23 1247 Oral     SpO2 08/12/23 1247 93 %     Weight 08/12/23 1248 240 lb (108.9 kg)     Height 08/12/23 1248 5\' 4"  (1.626 m)     Head Circumference --      Peak Flow --      Pain Score 08/12/23 1248 0     Pain Loc --      Pain Education --      Exclude from Growth Chart --    No data found.  Updated Vital Signs BP (!) 155/70 (BP Location: Right Arm)   Pulse 87   Temp 98 F (36.7 C) (Oral)   Resp 18   Ht 5\' 4"  (1.626 m)   Wt 240 lb (108.9 kg)   SpO2 93%   BMI 41.20 kg/m   Physical Exam Vitals and nursing note reviewed.  Constitutional:      General: She is not in acute distress.    Appearance: Normal appearance.  HENT:     Mouth/Throat:     Mouth: Mucous membranes are moist.     Pharynx: Oropharynx is clear.  Eyes:     Conjunctiva/sclera: Conjunctivae normal.  Cardiovascular:     Rate and Rhythm: Normal rate and regular rhythm.     Heart sounds: Normal heart sounds.  Pulmonary:     Effort: Pulmonary effort is normal.     Breath sounds: Normal breath sounds.  Musculoskeletal:     Cervical back: Normal range of motion.  Skin:    Findings: Erythema and wound present.     Comments: Right lower leg erythema, skin hot to touch. There are a few areas of pitted/ulcerated skin, a few have serous drainage. Skin is thick and firm. Tenderness of the proximal lower  leg. Distal sensation intact.   Neurological:     Mental Status: She is alert and oriented to person, place, and time.     UC Treatments / Results  Labs (all labs ordered are listed, but only abnormal results are displayed) Labs Reviewed - No data to display  EKG   Radiology No results found.  Procedures Procedures (including critical care time)  Medications Ordered in UC Medications - No data to display  Initial Impression / Assessment and Plan / UC Course  I have reviewed the triage vital signs and the nursing notes.  Pertinent labs & imaging results that were available during my care of the patient were reviewed by me and considered in my medical decision making (see chart for details).  Reports the leg wounds have been there for several months. The redness and warmth only started 2 days ago. Cover for cellulitis with keflex BID x 7 days. However I have concerns about chronic non-healing wounds. Advised patient follow closely with wound care center. A referral is placed. Discussed importance of follow up and risks of untreated infection.   Final Clinical Impressions(s) / UC Diagnoses   Final diagnoses:  Cellulitis of right lower extremity     Discharge Instructions      Keflex -- antibiotic to treat infection. Take twice daily for 7 days. Take with food to avoid upset stomach.  Please call the wound care center to make an appointment as  soon as possible. I have also placed a referral for you      ED Prescriptions     Medication Sig Dispense Auth. Provider   cephALEXin (KEFLEX) 500 MG capsule Take 1 capsule (500 mg total) by mouth 2 (two) times daily for 7 days. 14 capsule Selinda Korzeniewski, Lurena Joiner, PA-C      PDMP not reviewed this encounter.   Marlow Baars, [place] 08/12/23 1327

## 2023-08-13 ENCOUNTER — Emergency Department (HOSPITAL_BASED_OUTPATIENT_CLINIC_OR_DEPARTMENT_OTHER)

## 2023-08-13 ENCOUNTER — Emergency Department (HOSPITAL_BASED_OUTPATIENT_CLINIC_OR_DEPARTMENT_OTHER)
Admission: EM | Admit: 2023-08-13 | Discharge: 2023-08-13 | Disposition: A | Discharge status: Home / Self Care | Attending: Emergency Medicine | Admitting: Emergency Medicine

## 2023-08-13 ENCOUNTER — Encounter (HOSPITAL_BASED_OUTPATIENT_CLINIC_OR_DEPARTMENT_OTHER): Payer: Self-pay

## 2023-08-13 ENCOUNTER — Other Ambulatory Visit: Payer: Self-pay

## 2023-08-13 DIAGNOSIS — R0789 Other chest pain: Secondary | ICD-10-CM

## 2023-08-13 DIAGNOSIS — R600 Localized edema: Secondary | ICD-10-CM | POA: Insufficient documentation

## 2023-08-13 DIAGNOSIS — M7989 Other specified soft tissue disorders: Secondary | ICD-10-CM | POA: Insufficient documentation

## 2023-08-13 DIAGNOSIS — Z91040 Latex allergy status: Secondary | ICD-10-CM | POA: Diagnosis not present

## 2023-08-13 DIAGNOSIS — Z86718 Personal history of other venous thrombosis and embolism: Secondary | ICD-10-CM | POA: Insufficient documentation

## 2023-08-13 DIAGNOSIS — R079 Chest pain, unspecified: Secondary | ICD-10-CM | POA: Diagnosis present

## 2023-08-13 DIAGNOSIS — Z7901 Long term (current) use of anticoagulants: Secondary | ICD-10-CM | POA: Insufficient documentation

## 2023-08-13 DIAGNOSIS — I4891 Unspecified atrial fibrillation: Secondary | ICD-10-CM | POA: Insufficient documentation

## 2023-08-13 DIAGNOSIS — R918 Other nonspecific abnormal finding of lung field: Secondary | ICD-10-CM | POA: Diagnosis not present

## 2023-08-13 LAB — PROTIME-INR
INR: 2.3 — ABNORMAL HIGH (ref 0.8–1.2)
Prothrombin Time: 25.2 s — ABNORMAL HIGH (ref 11.4–15.2)

## 2023-08-13 LAB — COMPREHENSIVE METABOLIC PANEL
ALT: 20 U/L (ref 0–44)
AST: 20 U/L (ref 15–41)
Albumin: 3.2 g/dL — ABNORMAL LOW (ref 3.5–5.0)
Alkaline Phosphatase: 85 U/L (ref 38–126)
Anion gap: 11 (ref 5–15)
BUN: 16 mg/dL (ref 8–23)
CO2: 25 mmol/L (ref 22–32)
Calcium: 9.2 mg/dL (ref 8.9–10.3)
Chloride: 104 mmol/L (ref 98–111)
Creatinine, Ser: 0.81 mg/dL (ref 0.44–1.00)
GFR, Estimated: >60 mL/min (ref >60)
Glucose, Bld: 141 mg/dL — ABNORMAL HIGH (ref 70–99)
Potassium: 3.4 mmol/L — ABNORMAL LOW (ref 3.5–5.1)
Sodium: 140 mmol/L (ref 135–145)
Total Bilirubin: 0.7 mg/dL (ref 0.0–1.2)
Total Protein: 8.1 g/dL (ref 6.5–8.1)

## 2023-08-13 LAB — CBC
HCT: 40.9 % (ref 36.0–46.0)
Hemoglobin: 13.1 g/dL (ref 12.0–15.0)
MCH: 29.5 pg (ref 26.0–34.0)
MCHC: 32 g/dL (ref 30.0–36.0)
MCV: 92.1 fL (ref 80.0–100.0)
Platelets: 204 10*3/uL (ref 150–400)
RBC: 4.44 MIL/uL (ref 3.87–5.11)
RDW: 17.2 % — ABNORMAL HIGH (ref 11.5–15.5)
WBC: 9.4 10*3/uL (ref 4.0–10.5)
nRBC: 0 % (ref 0.0–0.2)

## 2023-08-13 LAB — TROPONIN I (HIGH SENSITIVITY)
Troponin I (High Sensitivity): 4 ng/L (ref <18)
Troponin I (High Sensitivity): 5 ng/L (ref <18)

## 2023-08-13 MED ORDER — POTASSIUM CHLORIDE CRYS ER 20 MEQ PO TBCR
20.0000 meq | EXTENDED_RELEASE_TABLET | Freq: Once | ORAL | Status: AC
  Administered 2023-08-13: 20 meq via ORAL
  Filled 2023-08-13: qty 1

## 2023-08-13 MED ORDER — LIDOCAINE 4 % EX PTCH: 1.0000 | MEDICATED_PATCH | CUTANEOUS | 0 refills | Status: AC

## 2023-08-13 MED ORDER — LIDOCAINE 5 % EX PTCH
1.0000 | MEDICATED_PATCH | CUTANEOUS | Status: DC
  Administered 2023-08-13: 1 via TRANSDERMAL
  Filled 2023-08-13: qty 1

## 2023-08-13 NOTE — ED Triage Notes (Signed)
 Patient arrives with complaints of intermittent chest pains x1 day. Patient states that she is having recurrent feelings of chest "soreness" on her left side. No pain on arrival.

## 2023-08-13 NOTE — ED Provider Notes (Signed)
 Patient here with left-sided chest pain seems reproducible on exam findings are unremarkable lab work unremarkable.  She is anticoagulated.  No concern for PE.  Chest x-ray overall with no acute findings.  Radiology report questions may be atelectasis versus scarring versus pneumonia.  She has no white count or cough or sputum production.  She is point tender and overall I do think that this is likely muscular in nature.  Will prescribe lidocaine patches.  Discharged in good condition.  Understands return precautions.  Recommend follow-up with primary care doctor.  This chart was dictated using voice recognition software.  Despite best efforts to proofread,  errors can occur which can change the documentation meaning.    Virgina Norfolk, DO 08/13/23 1537

## 2023-08-13 NOTE — Discharge Instructions (Signed)
 Follow-up with your primary care doctor.  Recommend 1000 mg of Tylenol every 6 hours as needed for pain, use lidocaine patches as prescribed.

## 2023-08-13 NOTE — ED Notes (Signed)
 Pt alert, NAD, calm, interactive, resps e/u, speaking in clear  complete sentences, VSS. Repeat trop sent. EDP at [facility].

## 2023-08-13 NOTE — ED Provider Notes (Signed)
 [place] [facility] Provider Note   CSN: [id number] Arrival date & time: 08/13/23  1035     History  Chief Complaint  Patient presents with   Chest Pain    [name] is a 83 y.o. female.  HPI 83 year old female presents with chest pain.  Started last night.  She describes it as a "hard pain" that comes and goes.  Lasts about 5 seconds at a time but sometimes it comes back to back.  It is in her left lateral chest wall under her ribs.  Has been coming and going and is fairly severe when it occurs.  No trauma to that area.  No cough, shortness of breath.  No abdominal pain that she wonders if it could be gas.  She also has noticed some leg erythema to the right lower leg and went to urgent care yesterday and was put on antibiotics for cellulitis.  She is on warfarin for DVT and A-fib.  Home Medications Prior to Admission medications   Medication Sig Start Date End Date Taking? Authorizing Provider  acetaminophen (TYLENOL) 325 MG tablet Take 325-650 mg by mouth 2 (two) times daily as needed for mild pain or headache.    [provider]  Carboxymethylcellulose Sodium (REFRESH LIQUIGEL OP) Place 1 drop into both eyes at bedtime.    [provider]  cephALEXin (KEFLEX) 500 MG capsule Take 1 capsule (500 mg total) by mouth 2 (two) times daily for 7 days. 08/12/23 08/19/23  Rising, Lurena Joiner, PA-C  diltiazem (TIAZAC) 360 MG 24 hr capsule Take 360 mg by mouth at bedtime.    [provider]  furosemide (LASIX) 40 MG tablet Take 1 tablet (40 mg total) by mouth 2 (two) times daily as needed. 01/12/23   Corky Crafts, MD  irbesartan (AVAPRO) 300 MG tablet Take 1 tablet (300 mg total) by mouth daily. 09/27/22   Marguerita Merles Latif, DO  levothyroxine (SYNTHROID, LEVOTHROID) 100 MCG tablet Take 100 mcg by mouth daily before breakfast.  10/07/14   [provider]  LORazepam (ATIVAN) 1 MG tablet Take 1.5 mg by mouth See  admin instructions. Take 1.5 mg by mouth at bedtime and an additional 1-1.5mg  once a day as needed for anxiety    [provider]  Lutein-Zeaxanthin 25-5 MG CAPS Take 1 capsule by mouth daily with breakfast.    [provider]  nebivolol (BYSTOLIC) 5 MG tablet Take 1 tablet (5 mg total) by mouth daily. 09/27/22   Marguerita Merles Latif, DO  nystatin (MYCOSTATIN/NYSTOP) powder Apply 1 Application topically 3 (three) times daily. 10/08/22   Sharlene Dory, PA-C  OXYGEN Inhale 3.5 L/min into the lungs at bedtime.    [provider]  Potassium Chloride ER 20 MEQ TBCR Take 1 tablet by mouth daily. 10/06/22   [provider]  PRESCRIPTION MEDICATION CPAP- At bedtime    [provider]  SIMPLY SALINE NA Place 1 spray into both nostrils as needed (for dryness).    [provider]  triamcinolone cream (KENALOG) 0.1 % Apply 1 Application topically See admin instructions. Apply to both legs 2 times a day 04/05/21   [provider]  warfarin (COUMADIN) 5 MG tablet Take 5 mg by mouth at bedtime. 04/25/15   [provider]      Allergies    Codeine, Erythromycin, Tetracyclines & related, Statins, Atenolol, Citalopram hydrobromide, Clindamycin/lincomycin, Coenzyme q10, Losartan potassium, Motrin [ibuprofen], Rosuvastatin, Simvastatin, Tape, and Latex  Review of Systems   Review of Systems  Constitutional:  Negative for fever.  Respiratory:  Negative for cough.   Cardiovascular:  Positive for chest pain and leg swelling.  Gastrointestinal:  Negative for abdominal pain.    Physical Exam Updated Vital Signs BP (!) 146/85   Pulse 79   Temp 97.9 F (36.6 C) (Oral)   Resp (!) 22   Ht 5\' 4"  (1.626 m)   Wt 108.9 kg   SpO2 94%   BMI 41.20 kg/m  Physical Exam Vitals and nursing note reviewed.  Constitutional:      Appearance: She is well-developed. She is obese.  HENT:     Head: Normocephalic and atraumatic.  Cardiovascular:     Rate  and Rhythm: Normal rate. Rhythm irregular.     Heart sounds: Normal heart sounds.  Pulmonary:     Effort: Pulmonary effort is normal.     Breath sounds: Normal breath sounds.  Chest:    Abdominal:     Palpations: Abdomen is soft.     Tenderness: There is no abdominal tenderness.  Musculoskeletal:     Right lower leg: Edema present.     Left lower leg: Edema present.     Comments: Both lower extremities are swollen and have some erythema  Skin:    General: Skin is warm and dry.  Neurological:     Mental Status: She is alert.     ED Results / Procedures / Treatments   Labs (all labs ordered are listed, but only abnormal results are displayed) Labs Reviewed  CBC - Abnormal; Notable for the following components:      Result Value   RDW 17.2 (*)    All other components within normal limits  PROTIME-INR - Abnormal; Notable for the following components:   Prothrombin Time 25.2 (*)    INR 2.3 (*)    All other components within normal limits  COMPREHENSIVE METABOLIC PANEL - Abnormal; Notable for the following components:   Potassium 3.4 (*)    Glucose, Bld 141 (*)    Albumin 3.2 (*)    All other components within normal limits  TROPONIN I (HIGH SENSITIVITY)  TROPONIN I (HIGH SENSITIVITY)    EKG EKG Interpretation Date/Time:  Thursday August 13 2023 11:25:00 EST Ventricular Rate:  80 PR Interval:    QRS Duration:  95 QT Interval:  388 QTC Calculation: 448 R Axis:   65  Text Interpretation: Atrial fibrillation Low voltage, precordial leads Nonspecific T abnormalities, lateral leads Confirmed by Pricilla Loveless [id number]) on 08/13/2023 11:53:12 AM  Radiology No results found.  Procedures Procedures    Medications Ordered in ED Medications  potassium chloride SA (KLOR-CON M) CR tablet 20 mEq (20 mEq Oral Given 08/13/23 1435)    ED Course/ Medical Decision Making/ A&P                                 Medical Decision Making Amount and/or Complexity of Data  Reviewed External Data Reviewed: notes. Labs: ordered.    Details: Therapeutic INR. Normal troponins. Radiology: ordered. ECG/medicine tests: ordered and independent interpretation performed.    Details: Afib, no ischemia  Risk Prescription drug management.   Patient's chest pain seems MSK. Doubt ACS. Is therapeutic on INR, highly doubt PE based on atypical presentation. CXR still pending. She has declines meds.  Of note, both legs are swollen but this is a recurrent leg infection she's dealt with  over the years. Doubt DVT, no new/unilateral swelling. No pain out of proportion.  Care transferred to Dr. Lockie Mola.        Final Clinical Impression(s) / ED Diagnoses Final diagnoses:  None    Rx / DC Orders ED Discharge Orders     None         Pricilla Loveless, MD 08/13/23 1458

## 2023-08-14 ENCOUNTER — Other Ambulatory Visit: Payer: Self-pay | Admitting: Internal Medicine

## 2023-08-14 DIAGNOSIS — Z1231 Encounter for screening mammogram for malignant neoplasm of breast: Secondary | ICD-10-CM

## 2023-08-16 NOTE — Progress Notes (Unsigned)
 PATIENT: [name] DOB: [birth year]  REASON FOR VISIT: follow up HISTORY FROM: patient  No chief complaint on file.    HISTORY OF PRESENT ILLNESS: Today  [name] is a 83 y.o. female with a history of OSA on CPAP . Returns today for follow-up.      08/13/22: [name] is a 83 y.o. female with a history of OSA on CPAP. Returns today for follow-up. Reports that CPAP works well.  Reports that the mask is leaking.  She does not feel it but she does state that her machine gives her red face each morning.  Her download is below.     12/19/21: [name] is an 83 year old female with a history of obstructive sleep apnea on CPAP.  She received a new machine.  Her download is below.  She does state that she has a fullface mask but does not feel that it fits appropriately.    08/06/21: [name] is an 83 year old female with a history of obstructive sleep apnea on CPAP.  Her download indicates that she used her machine nightly for compliance of 100%.  She used her machine greater than 4 hours each night.  On average she uses her machine 5 hours and 49 minutes.  Her residual AHI is 1 on 10.6 cm of water with EPR 3.  Leak in the 95th percentile is 45.4 L/min  08/06/20: [name] is an 83 year old female with a history of obstructive sleep apnea on CPAP.  She reports that the CPAP works well for her.  She has the nasal pillows and reports that sometime during the night she will open her mouth to breathe.  Her mouth becomes very dry.  She is open to trying a different mask.  Her download is below.  Compliance Report Usage 07/07/2020 - 08/05/2020 Usage days 30/30 days (100%) >= 4 hours 30 days (100%) Average usage (days used) 5 hours 47 minutes  AirSense 10 AutoSet Serial number [id number] Mode CPAP Set pressure 10.6 cmH2O EPR Fulltime EPR level 3 Therapy Leaks - L/min Median: 23.4 95th percentile: 42.3 Maximum: 64.8 Events per hour AI:  0.5 HI: 0.2 AHI: 0.7 Apnea Index Central: 0.0 Obstructive: 0.4 Unknown: 0.1    REVIEW OF SYSTEMS: Out of a complete 14 system review of symptoms, the patient complains only of the following symptoms, and all other reviewed systems are negative.   ESS 6  ALLERGIES: Allergies  Allergen Reactions   Codeine Other (See Comments)    Caused Hyperactivity and Dysphoria   Erythromycin Itching, Anxiety and Other (See Comments)    Also with jitters   Tetracyclines & Related Itching    Also with jitters   Statins Other (See Comments)    Leg muscle weakness   Atenolol Other (See Comments)    Depression    Citalopram Hydrobromide Nausea Only and Other (See Comments)    Nausea/Fatigue   Clindamycin/Lincomycin Other (See Comments)    Via IV, has caused a metallic taste in the mouth    Coenzyme Q10 Itching   Losartan Potassium Other (See Comments)    Muscle aches    Motrin [Ibuprofen] Other (See Comments)    Is to not take this   Rosuvastatin Other (See Comments)    Myalgias   Simvastatin Other (See Comments)    Myalgias   Tape     PAPER TAPE IS PPEFERRED   Latex Rash    HOME MEDICATIONS: Outpatient Medications Prior to Visit  Medication Sig Dispense  Refill   acetaminophen (TYLENOL) 325 MG tablet Take 325-650 mg by mouth 2 (two) times daily as needed for mild pain or headache.     Carboxymethylcellulose Sodium (REFRESH LIQUIGEL OP) Place 1 drop into both eyes at bedtime.     cephALEXin (KEFLEX) 500 MG capsule Take 1 capsule (500 mg total) by mouth 2 (two) times daily for 7 days. 14 capsule 0   diltiazem (TIAZAC) 360 MG 24 hr capsule Take 360 mg by mouth at bedtime.     furosemide (LASIX) 40 MG tablet Take 1 tablet (40 mg total) by mouth 2 (two) times daily as needed. 60 tablet 11   irbesartan (AVAPRO) 300 MG tablet Take 1 tablet (300 mg total) by mouth daily. 30 tablet 0   levothyroxine (SYNTHROID, LEVOTHROID) 100 MCG tablet Take 100 mcg by mouth daily before breakfast.   1    lidocaine (HM LIDOCAINE PATCH) 4 % Place 1 patch onto the skin daily. 30 patch 0   LORazepam (ATIVAN) 1 MG tablet Take 1.5 mg by mouth See admin instructions. Take 1.5 mg by mouth at bedtime and an additional 1-1.5mg  once a day as needed for anxiety     Lutein-Zeaxanthin 25-5 MG CAPS Take 1 capsule by mouth daily with breakfast.     nebivolol (BYSTOLIC) 5 MG tablet Take 1 tablet (5 mg total) by mouth daily. 30 tablet 0   nystatin (MYCOSTATIN/NYSTOP) powder Apply 1 Application topically 3 (three) times daily. 30 g 0   OXYGEN Inhale 3.5 L/min into the lungs at bedtime.     Potassium Chloride ER 20 MEQ TBCR Take 1 tablet by mouth daily.     PRESCRIPTION MEDICATION CPAP- At bedtime     SIMPLY SALINE NA Place 1 spray into both nostrils as needed (for dryness).     triamcinolone cream (KENALOG) 0.1 % Apply 1 Application topically See admin instructions. Apply to both legs 2 times a day     warfarin (COUMADIN) 5 MG tablet Take 5 mg by mouth at bedtime.  2   No facility-administered medications prior to visit.    PAST MEDICAL HISTORY: Past Medical History:  Diagnosis Date   Anxiety    takes Lorazepam daily as needed   Arthritis    Cancer (HCC)    thyroid   Cataracts, bilateral    immature   Chronic back pain    compressed vertebrae,takes Fosamax weekly   Family history of adverse reaction to anesthesia    granddaughter gets very sick and mean   History of blood transfusion    no abnormal reaction noted   History of bronchitis 05/2015   History of colon polyps    benign   Hyperlipidemia    takes Zetia daily   Hypertension    takes Diltiazem and Avapro daily   Hypothyroidism    takes Synthroid daily   Ischemic colitis (HCC)    hx of-many yrs ago   Joint pain    Joint swelling    OSA on CPAP    05-08-12 AHi was 46, RDI  53. titrated to   9 cm water  with 3 cm flex.-nadir 75%   PE (pulmonary embolism)    takes Coumadin daily   Peripheral edema    Pneumonia 1992   hx of    Sarcoidosis    Shortness of breath    Urinary frequency    Weakness    left leg.Numbness and tingling.Has sciatica   Wheeze    occaionally. Not new  PAST SURGICAL HISTORY: Past Surgical History:  Procedure Laterality Date   ABDOMINAL HYSTERECTOMY     APPENDECTOMY     BREAST BIOPSY     biopsies on right x 2   BREAST CYST ASPIRATION Left    CHOLECYSTECTOMY N/A 11/29/2015   Procedure: LAPAROSCOPIC CHOLECYSTECTOMY;  Surgeon: Almond Lint, MD;  Location: [facility] OR;  Service: General;  Laterality: N/A;   COLONOSCOPY     DILATION AND CURETTAGE OF UTERUS     LIVER BIOPSY     MELANOMA EXCISION     removed from left leg   scalene surgery  1972   "trying to find out what was wrong with her lungs"   THYROID SURGERY      FAMILY HISTORY: Family History  Problem Relation Age of Onset   Breast cancer Mother    CVA Mother    Breast cancer Sister    Breast cancer Sister    Heart attack Neg Hx    Sleep apnea Neg Hx     SOCIAL HISTORY: Social History   Socioeconomic History   Marital status: Married    Spouse name: Not on file   Number of children: Not on file   Years of education: Not on file   Highest education level: Not on file  Occupational History   Occupation: Retired    Associate Professor: RETIRED  Tobacco Use   Smoking status: Never   Smokeless tobacco: Never  Vaping Use   Vaping status: Never Used  Substance and Sexual Activity   Alcohol use: No   Drug use: No   Sexual activity: Not on file  Other Topics Concern   Not on file  Social History Narrative   Caffeine: 8 oz soda per day   Social Drivers of Health   Financial Resource Strain: Not on file  Food Insecurity: No Food Insecurity (05/04/2023)   Hunger Vital Sign    Worried About Running Out of Food in the Last Year: Never true    Ran Out of Food in the Last Year: Never true  Transportation Needs: No Transportation Needs (05/04/2023)   PRAPARE - Administrator, Civil Service (Medical): No    Lack of  Transportation (Non-Medical): No  Physical Activity: Not on file  Stress: Not on file  Social Connections: Unknown (08/04/2022)   Received from [facility]   Social Network    Social Network: Not on file  Intimate Partner Violence: Not At Risk (09/18/2022)   Humiliation, Afraid, Rape, and Kick questionnaire    Fear of Current or Ex-Partner: No    Emotionally Abused: No    Physically Abused: No    Sexually Abused: No      PHYSICAL EXAM  There were no vitals filed for this visit.   There is no height or weight on file to calculate BMI.  Generalized: Well developed, in no acute distress  Chest: Lungs clear to auscultation bilaterally  Neurological examination  Mentation: Alert oriented to time, place, history taking. Follows all commands speech and language fluent Cranial nerve II-XII: Extraocular movements were full, visual field were full on confrontational test Head turning and shoulder shrug  were normal and symmetric. Motor: The motor testing reveals 5 over 5 strength of all 4 extremities. Good symmetric motor tone is noted throughout.  Sensory: Sensory testing is intact to soft touch on all 4 extremities. No evidence of extinction is noted.  Gait and station: Gait is normal.    DIAGNOSTIC DATA (LABS, IMAGING, TESTING) -  I reviewed patient records, labs, notes, testing and imaging myself where available.  Lab Results  Component Value Date   WBC 9.4 08/13/2023   HGB 13.1 08/13/2023   HCT 40.9 08/13/2023   MCV 92.1 08/13/2023   PLT 204 08/13/2023      Component Value Date/Time   NA 140 08/13/2023 1057   NA 144 01/12/2023 1010   NA 140 11/29/2014 0839   K 3.4 (L) 08/13/2023 1057   K 4.4 11/29/2014 0839   CL 104 08/13/2023 1057   CL 104 04/29/2012 1145   CO2 25 08/13/2023 1057   CO2 28 11/29/2014 0839   GLUCOSE 141 (H) 08/13/2023 1057   GLUCOSE 121 11/29/2014 0839   GLUCOSE 93 04/29/2012 1145   BUN 16 08/13/2023 1057   BUN 14 01/12/2023 1010    BUN 13.5 11/29/2014 0839   CREATININE 0.81 08/13/2023 1057   CREATININE 0.8 11/29/2014 0839   CALCIUM 9.2 08/13/2023 1057   CALCIUM 9.1 11/29/2014 0839   PROT 8.1 08/13/2023 1057   PROT 7.8 01/12/2023 1008   PROT 7.4 11/29/2014 0839   ALBUMIN 3.2 (L) 08/13/2023 1057   ALBUMIN 3.9 01/12/2023 1008   ALBUMIN 3.4 (L) 11/29/2014 0839   AST 20 08/13/2023 1057   AST 19 11/29/2014 0839   ALT 20 08/13/2023 1057   ALT 24 11/29/2014 0839   ALKPHOS 85 08/13/2023 1057   ALKPHOS 84 11/29/2014 0839   BILITOT 0.7 08/13/2023 1057   BILITOT 0.7 01/12/2023 1008   BILITOT 0.42 11/29/2014 0839   GFRNONAA >60 08/13/2023 1057   GFRAA >60 11/28/2015 0844    Lab Results  Component Value Date   TSH 2.282 09/21/2022      ASSESSMENT AND PLAN 83 y.o. year old female  has a past medical history of Anxiety, Arthritis, Cancer (HCC), Cataracts, bilateral, Chronic back pain, Family history of adverse reaction to anesthesia, History of blood transfusion, History of bronchitis (05/2015), History of colon polyps, Hyperlipidemia, Hypertension, Hypothyroidism, Ischemic colitis (HCC), Joint pain, Joint swelling, OSA on CPAP, PE (pulmonary embolism), Peripheral edema, Pneumonia (1992), Sarcoidosis, Shortness of breath, Urinary frequency, Weakness, and Wheeze. here with:  OSA on CPAP  - CPAP compliance excellent - Good treatment of AHI  - Encourage patient to use CPAP nightly and > 4 hours each night -  order sent for Mask refitting - F/U in 1 year or sooner if needed     Butch Penny, MSN, NP-C 08/16/2023, 1:39 PM [facility] [address] [place], [place] [address] [phone]

## 2023-08-17 NOTE — Progress Notes (Unsigned)
 Setup Date: 09/25/21

## 2023-08-18 ENCOUNTER — Encounter: Payer: Self-pay | Admitting: Adult Health

## 2023-08-18 ENCOUNTER — Ambulatory Visit: Payer: Medicare HMO | Admitting: Adult Health

## 2023-08-18 VITALS — BP 138/72 | HR 70 | Ht 64.0 in | Wt 249.0 lb

## 2023-08-18 DIAGNOSIS — G4733 Obstructive sleep apnea (adult) (pediatric): Secondary | ICD-10-CM

## 2023-08-18 NOTE — Patient Instructions (Addendum)
Continue using CPAP nightly and greater than 4 hours each night Order sent for mask refitting If your symptoms worsen or you develop new symptoms please let us know.   

## 2023-08-24 NOTE — Progress Notes (Signed)
 New, Doristine Mango, RN; Tomasa Rand; 1 other Received, thank you!     Previous Messages    ----- Message ----- From: Guy Begin, RN Sent: 08/19/2023   4:54 PM EDT To: Kathyrn Sheriff; Santina Evans; Kathe Becton; * Subject: mask refitting, dreamwear face mask            New order in EPIC  [name] Female, 83 y.o., May 16, 1941 MRN: [id number] Thank you  Andrey Campanile RN

## 2023-08-26 ENCOUNTER — Encounter (HOSPITAL_BASED_OUTPATIENT_CLINIC_OR_DEPARTMENT_OTHER): Attending: General Surgery | Admitting: General Surgery

## 2023-08-26 DIAGNOSIS — I872 Venous insufficiency (chronic) (peripheral): Secondary | ICD-10-CM | POA: Insufficient documentation

## 2023-08-26 DIAGNOSIS — I890 Lymphedema, not elsewhere classified: Secondary | ICD-10-CM | POA: Insufficient documentation

## 2023-08-26 DIAGNOSIS — I5030 Unspecified diastolic (congestive) heart failure: Secondary | ICD-10-CM | POA: Diagnosis not present

## 2023-08-26 DIAGNOSIS — Z7901 Long term (current) use of anticoagulants: Secondary | ICD-10-CM | POA: Diagnosis not present

## 2023-08-26 DIAGNOSIS — N182 Chronic kidney disease, stage 2 (mild): Secondary | ICD-10-CM | POA: Diagnosis not present

## 2023-08-26 DIAGNOSIS — E782 Mixed hyperlipidemia: Secondary | ICD-10-CM | POA: Diagnosis not present

## 2023-08-26 DIAGNOSIS — E876 Hypokalemia: Secondary | ICD-10-CM | POA: Diagnosis not present

## 2023-08-26 DIAGNOSIS — I130 Hypertensive heart and chronic kidney disease with heart failure and stage 1 through stage 4 chronic kidney disease, or unspecified chronic kidney disease: Secondary | ICD-10-CM | POA: Diagnosis not present

## 2023-08-26 DIAGNOSIS — L97819 Non-pressure chronic ulcer of other part of right lower leg with unspecified severity: Secondary | ICD-10-CM | POA: Insufficient documentation

## 2023-08-26 DIAGNOSIS — E559 Vitamin D deficiency, unspecified: Secondary | ICD-10-CM | POA: Diagnosis not present

## 2023-08-26 DIAGNOSIS — I5032 Chronic diastolic (congestive) heart failure: Secondary | ICD-10-CM | POA: Diagnosis not present

## 2023-08-26 DIAGNOSIS — E039 Hypothyroidism, unspecified: Secondary | ICD-10-CM | POA: Diagnosis not present

## 2023-09-01 ENCOUNTER — Ambulatory Visit (HOSPITAL_BASED_OUTPATIENT_CLINIC_OR_DEPARTMENT_OTHER): Admitting: General Surgery

## 2023-09-01 DIAGNOSIS — I4811 Longstanding persistent atrial fibrillation: Secondary | ICD-10-CM | POA: Diagnosis not present

## 2023-09-01 DIAGNOSIS — Z7901 Long term (current) use of anticoagulants: Secondary | ICD-10-CM | POA: Diagnosis not present

## 2023-09-01 DIAGNOSIS — R82998 Other abnormal findings in urine: Secondary | ICD-10-CM | POA: Diagnosis not present

## 2023-09-01 DIAGNOSIS — E890 Postprocedural hypothyroidism: Secondary | ICD-10-CM | POA: Diagnosis not present

## 2023-09-01 DIAGNOSIS — I10 Essential (primary) hypertension: Secondary | ICD-10-CM | POA: Diagnosis not present

## 2023-09-01 DIAGNOSIS — Z0000 Encounter for general adult medical examination without abnormal findings: Secondary | ICD-10-CM | POA: Diagnosis not present

## 2023-09-01 DIAGNOSIS — Z6841 Body Mass Index (BMI) 40.0 and over, adult: Secondary | ICD-10-CM | POA: Diagnosis not present

## 2023-09-01 DIAGNOSIS — I5032 Chronic diastolic (congestive) heart failure: Secondary | ICD-10-CM | POA: Diagnosis not present

## 2023-09-01 DIAGNOSIS — M170 Bilateral primary osteoarthritis of knee: Secondary | ICD-10-CM | POA: Diagnosis not present

## 2023-09-02 ENCOUNTER — Encounter (HOSPITAL_BASED_OUTPATIENT_CLINIC_OR_DEPARTMENT_OTHER): Admitting: General Surgery

## 2023-09-02 DIAGNOSIS — I890 Lymphedema, not elsewhere classified: Secondary | ICD-10-CM | POA: Diagnosis not present

## 2023-09-02 DIAGNOSIS — I872 Venous insufficiency (chronic) (peripheral): Secondary | ICD-10-CM | POA: Diagnosis not present

## 2023-09-02 DIAGNOSIS — I5030 Unspecified diastolic (congestive) heart failure: Secondary | ICD-10-CM | POA: Diagnosis not present

## 2023-09-02 DIAGNOSIS — L97819 Non-pressure chronic ulcer of other part of right lower leg with unspecified severity: Secondary | ICD-10-CM | POA: Diagnosis not present

## 2023-09-02 DIAGNOSIS — Z7901 Long term (current) use of anticoagulants: Secondary | ICD-10-CM | POA: Diagnosis not present

## 2023-09-10 ENCOUNTER — Encounter (HOSPITAL_BASED_OUTPATIENT_CLINIC_OR_DEPARTMENT_OTHER): Attending: General Surgery | Admitting: General Surgery

## 2023-09-10 DIAGNOSIS — I5030 Unspecified diastolic (congestive) heart failure: Secondary | ICD-10-CM | POA: Insufficient documentation

## 2023-09-10 DIAGNOSIS — Z86711 Personal history of pulmonary embolism: Secondary | ICD-10-CM | POA: Diagnosis not present

## 2023-09-10 DIAGNOSIS — L97812 Non-pressure chronic ulcer of other part of right lower leg with fat layer exposed: Secondary | ICD-10-CM | POA: Diagnosis not present

## 2023-09-10 DIAGNOSIS — I872 Venous insufficiency (chronic) (peripheral): Secondary | ICD-10-CM | POA: Diagnosis not present

## 2023-09-10 DIAGNOSIS — L97819 Non-pressure chronic ulcer of other part of right lower leg with unspecified severity: Secondary | ICD-10-CM | POA: Diagnosis not present

## 2023-09-10 DIAGNOSIS — Z86718 Personal history of other venous thrombosis and embolism: Secondary | ICD-10-CM | POA: Diagnosis not present

## 2023-09-10 DIAGNOSIS — Z7901 Long term (current) use of anticoagulants: Secondary | ICD-10-CM | POA: Diagnosis not present

## 2023-09-10 DIAGNOSIS — I890 Lymphedema, not elsewhere classified: Secondary | ICD-10-CM | POA: Insufficient documentation

## 2023-09-16 ENCOUNTER — Encounter (HOSPITAL_BASED_OUTPATIENT_CLINIC_OR_DEPARTMENT_OTHER): Admitting: General Surgery

## 2023-09-16 DIAGNOSIS — I5030 Unspecified diastolic (congestive) heart failure: Secondary | ICD-10-CM | POA: Diagnosis not present

## 2023-09-16 DIAGNOSIS — Z86718 Personal history of other venous thrombosis and embolism: Secondary | ICD-10-CM | POA: Diagnosis not present

## 2023-09-16 DIAGNOSIS — Z7901 Long term (current) use of anticoagulants: Secondary | ICD-10-CM | POA: Diagnosis not present

## 2023-09-16 DIAGNOSIS — I872 Venous insufficiency (chronic) (peripheral): Secondary | ICD-10-CM | POA: Diagnosis not present

## 2023-09-16 DIAGNOSIS — Z86711 Personal history of pulmonary embolism: Secondary | ICD-10-CM | POA: Diagnosis not present

## 2023-09-16 DIAGNOSIS — L97819 Non-pressure chronic ulcer of other part of right lower leg with unspecified severity: Secondary | ICD-10-CM | POA: Diagnosis not present

## 2023-09-16 DIAGNOSIS — I890 Lymphedema, not elsewhere classified: Secondary | ICD-10-CM | POA: Diagnosis not present

## 2023-09-21 ENCOUNTER — Telehealth: Payer: Self-pay | Admitting: Orthopaedic Surgery

## 2023-09-21 NOTE — Telephone Encounter (Signed)
 Pt called requesting a call about bil knee gel injections. Please call with update for approved. Pt phone number is [phone].

## 2023-09-23 ENCOUNTER — Ambulatory Visit (HOSPITAL_COMMUNITY)
Admission: RE | Admit: 2023-09-23 | Discharge: 2023-09-23 | Disposition: A | Discharge status: Home / Self Care | Source: Ambulatory Visit | Attending: Vascular Surgery | Admitting: Vascular Surgery

## 2023-09-23 ENCOUNTER — Ambulatory Visit (HOSPITAL_BASED_OUTPATIENT_CLINIC_OR_DEPARTMENT_OTHER): Admitting: General Surgery

## 2023-09-23 ENCOUNTER — Telehealth: Payer: Self-pay | Admitting: Physician Assistant

## 2023-09-23 ENCOUNTER — Other Ambulatory Visit (HOSPITAL_COMMUNITY): Payer: Self-pay | Admitting: General Surgery

## 2023-09-23 DIAGNOSIS — R600 Localized edema: Secondary | ICD-10-CM | POA: Diagnosis not present

## 2023-09-23 NOTE — Telephone Encounter (Signed)
 Patient called and wanted to know if her insurance approved for the Gel injection. She said she haven't heard anything. CB#[phone]

## 2023-09-23 NOTE — Telephone Encounter (Signed)
 VOB has been submitted for Durolane, bilateral knee.

## 2023-09-23 NOTE — Telephone Encounter (Signed)
 Talked with patient concerning gel injection.  Advised patient that I will call her to schedule once it is approved by her insurance.  Patient voiced that she understands.

## 2023-09-24 ENCOUNTER — Other Ambulatory Visit: Payer: Self-pay

## 2023-09-24 DIAGNOSIS — M170 Bilateral primary osteoarthritis of knee: Secondary | ICD-10-CM

## 2023-09-30 ENCOUNTER — Encounter: Payer: Self-pay | Admitting: Orthopaedic Surgery

## 2023-09-30 ENCOUNTER — Ambulatory Visit (HOSPITAL_BASED_OUTPATIENT_CLINIC_OR_DEPARTMENT_OTHER): Admitting: General Surgery

## 2023-09-30 ENCOUNTER — Ambulatory Visit: Admitting: Orthopaedic Surgery

## 2023-09-30 DIAGNOSIS — M170 Bilateral primary osteoarthritis of knee: Secondary | ICD-10-CM

## 2023-09-30 MED ORDER — SODIUM HYALURONATE 60 MG/3ML IX PRSY
60.0000 mg | PREFILLED_SYRINGE | INTRA_ARTICULAR | Status: AC | PRN
  Administered 2023-09-30: 60 mg via INTRA_ARTICULAR

## 2023-09-30 NOTE — Progress Notes (Signed)
   Procedure Note  Patient: [name]             Date of Birth: 1941-04-13           MRN: [id number]             Visit Date: 09/30/2023  Procedures: Visit Diagnoses:  1. Bilateral primary osteoarthritis of knee     Large Joint Inj: bilateral knee on 09/30/2023 8:38 AM Indications: pain Details: 22 G needle  Arthrogram: No  Medications (Right): 60 mg Sodium Hyaluronate 60 MG/3ML Medications (Left): 60 mg Sodium Hyaluronate 60 MG/3ML Outcome: tolerated well, no immediate complications Patient was prepped and draped in the usual sterile fashion.     Bilateral durolane injections performed today.

## 2023-10-05 DIAGNOSIS — G4733 Obstructive sleep apnea (adult) (pediatric): Secondary | ICD-10-CM | POA: Diagnosis not present

## 2023-10-07 DIAGNOSIS — E890 Postprocedural hypothyroidism: Secondary | ICD-10-CM | POA: Diagnosis not present

## 2023-10-07 DIAGNOSIS — Z7901 Long term (current) use of anticoagulants: Secondary | ICD-10-CM | POA: Diagnosis not present

## 2023-10-07 DIAGNOSIS — I80209 Phlebitis and thrombophlebitis of unspecified deep vessels of unspecified lower extremity: Secondary | ICD-10-CM | POA: Diagnosis not present

## 2023-10-07 DIAGNOSIS — Z86711 Personal history of pulmonary embolism: Secondary | ICD-10-CM | POA: Diagnosis not present

## 2023-10-15 ENCOUNTER — Ambulatory Visit
Admission: RE | Admit: 2023-10-15 | Discharge: 2023-10-15 | Disposition: A | Discharge status: Home / Self Care | Source: Ambulatory Visit | Attending: Internal Medicine | Admitting: Internal Medicine

## 2023-10-15 DIAGNOSIS — Z1231 Encounter for screening mammogram for malignant neoplasm of breast: Secondary | ICD-10-CM | POA: Diagnosis not present

## 2023-11-10 DIAGNOSIS — Z7901 Long term (current) use of anticoagulants: Secondary | ICD-10-CM | POA: Diagnosis not present

## 2023-11-10 DIAGNOSIS — Z86711 Personal history of pulmonary embolism: Secondary | ICD-10-CM | POA: Diagnosis not present

## 2023-11-10 DIAGNOSIS — D689 Coagulation defect, unspecified: Secondary | ICD-10-CM | POA: Diagnosis not present

## 2023-11-10 DIAGNOSIS — I80209 Phlebitis and thrombophlebitis of unspecified deep vessels of unspecified lower extremity: Secondary | ICD-10-CM | POA: Diagnosis not present

## 2023-12-15 ENCOUNTER — Ambulatory Visit: Admitting: Orthopaedic Surgery

## 2023-12-15 ENCOUNTER — Other Ambulatory Visit (INDEPENDENT_AMBULATORY_CARE_PROVIDER_SITE_OTHER): Payer: Self-pay

## 2023-12-15 DIAGNOSIS — D689 Coagulation defect, unspecified: Secondary | ICD-10-CM | POA: Diagnosis not present

## 2023-12-15 DIAGNOSIS — M25552 Pain in left hip: Secondary | ICD-10-CM | POA: Diagnosis not present

## 2023-12-15 DIAGNOSIS — I80209 Phlebitis and thrombophlebitis of unspecified deep vessels of unspecified lower extremity: Secondary | ICD-10-CM | POA: Diagnosis not present

## 2023-12-15 DIAGNOSIS — Z86711 Personal history of pulmonary embolism: Secondary | ICD-10-CM | POA: Diagnosis not present

## 2023-12-15 DIAGNOSIS — Z7901 Long term (current) use of anticoagulants: Secondary | ICD-10-CM | POA: Diagnosis not present

## 2023-12-15 MED ORDER — LIDOCAINE HCL 1 % IJ SOLN
3.0000 mL | INTRAMUSCULAR | Status: AC | PRN
Start: 2023-12-15 — End: 2023-12-15
  Administered 2023-12-15: 3 mL

## 2023-12-15 MED ORDER — METHYLPREDNISOLONE ACETATE 40 MG/ML IJ SUSP
40.0000 mg | INTRAMUSCULAR | Status: AC | PRN
  Administered 2023-12-15: 40 mg via INTRA_ARTICULAR

## 2023-12-15 MED ORDER — BUPIVACAINE HCL 0.5 % IJ SOLN
3.0000 mL | INTRAMUSCULAR | Status: AC | PRN
  Administered 2023-12-15: 3 mL via INTRA_ARTICULAR

## 2023-12-15 NOTE — Progress Notes (Signed)
 Office Visit Note   Patient: [name]           Date of Birth: [birth year]           MRN: [id number] Visit Date: 12/15/2023              Requested by: [name], MD [address] [place],  [place] [address] PCP: [name], MD   Assessment & Plan: Visit Diagnoses:  1. Pain in left hip     Plan: History of Present Illness [name] is an 83 year old female who presents with left hip pain.  She experiences soreness in the left hip, particularly in the joint and on the side, with severe pain exacerbated by pressure on the bursa. Discomfort is significant when lying on the left side, necessitating lifting herself to avoid rolling onto it. X-rays show slight degeneration in the hip joints and bone spurs. No pain occurs with movement or rotation of the hip joint.  Physical Exam MUSCULOSKELETAL: Tenderness over left hip bursa. Normal range of motion and rotation in left hip.  Results RADIOLOGY Hip X-ray: Mild degenerative changes in hip joints with osteophytes (09/2023)  Assessment and Plan Trochanteric bursitis, left hip Pain localized to the left hip, exacerbated by pressure, consistent with trochanteric bursitis. - Administer cortisone injection to the left hip bursa.  Follow-Up Instructions: No follow-ups on file.   Orders:  Orders Placed This Encounter  Procedures   XR HIP UNILAT W OR W/O PELVIS 2-3 VIEWS LEFT   No orders of the defined types were placed in this encounter.     Procedures: Large Joint Inj: L greater trochanter on 12/15/2023 12:30 PM Indications: pain Details: 22 G needle Medications: 3 mL lidocaine  1 %; 3 mL bupivacaine  0.5 %; 40 mg methylPREDNISolone  acetate 40 MG/ML Outcome: tolerated well, no immediate complications Patient was prepped and draped in the usual sterile fashion.       Clinical Data: No additional findings.   Subjective: Chief Complaint  Patient presents with   Left Hip - Pain     HPI  Review of Systems  Constitutional: Negative.   HENT: Negative.    Eyes: Negative.   Respiratory: Negative.    Cardiovascular: Negative.   Endocrine: Negative.   Musculoskeletal: Negative.   Neurological: Negative.   Hematological: Negative.   Psychiatric/Behavioral: Negative.    All other systems reviewed and are negative.    Objective: Vital Signs: There were no vitals taken for this visit.  Physical Exam Vitals and nursing note reviewed.  Constitutional:      Appearance: She is well-developed.  HENT:     Head: Atraumatic.     Nose: Nose normal.  Eyes:     Extraocular Movements: Extraocular movements intact.  Cardiovascular:     Pulses: Normal pulses.  Pulmonary:     Effort: Pulmonary effort is normal.  Abdominal:     Palpations: Abdomen is soft.  Musculoskeletal:     Cervical back: Neck supple.  Skin:    General: Skin is warm.     Capillary Refill: Capillary refill takes less than 2 seconds.  Neurological:     Mental Status: She is alert. Mental status is at baseline.  Psychiatric:        Behavior: Behavior normal.        Thought Content: Thought content normal.        Judgment: Judgment normal.     Ortho Exam  Specialty Comments:  No specialty comments available.  Imaging: No results found.   PMFS History: Patient Active Problem List   Diagnosis Date Noted   Heart failure with preserved ejection fraction (HCC) 09/26/2022   Permanent atrial fibrillation (HCC) 09/26/2022   Warfarin anticoagulation 09/18/2022   Sepsis (HCC) 09/18/2022   Sepsis due to cellulitis (HCC) 09/18/2022   Cellulitis of right leg 09/18/2022   Chronic intermittent hypoxia with obstructive sleep apnea 09/12/2021   Severe obstructive sleep apnea-hypopnea syndrome 09/12/2021   Acute medial meniscus tear of right knee 03/31/2018   Acute pain of right knee 01/12/2018   Hypercoagulation syndrome (HCC) 05/24/2014   Hunter's glossitis 05/24/2014   Sarcoidosis  05/24/2014   Hypoxemia 05/24/2014   Primary hypertension 10/13/2013   Nocturnal hypoxia 05/19/2013   Obstructive sleep apnea (adult) (pediatric) 05/19/2013   Obesity 05/19/2013   OSA on CPAP    Adenopathy 05/26/2012   Cough 05/20/2012   OSA (obstructive sleep apnea) 05/20/2012   Pulmonary embolism (HCC) 04/29/2012   DVT (deep venous thrombosis) (HCC) 04/29/2012   Past Medical History:  Diagnosis Date   Anxiety    takes Lorazepam  daily as needed   Arthritis    Cancer (HCC)    thyroid    Cataracts, bilateral    immature   Chronic back pain    compressed vertebrae,takes Fosamax weekly   Family history of adverse reaction to anesthesia    granddaughter gets very sick and mean   History of blood transfusion    no abnormal reaction noted   History of bronchitis 05/2015   History of colon polyps    benign   Hyperlipidemia    takes Zetia  daily   Hypertension    takes Diltiazem  and Avapro  daily   Hypothyroidism    takes Synthroid  daily   Ischemic colitis (HCC)    hx of-many yrs ago   Joint pain    Joint swelling    OSA on CPAP    05-08-12 AHi was 46, RDI  53. titrated to   9 cm water  with 3 cm flex.-nadir 75%   PE (pulmonary embolism)    takes Coumadin  daily   Peripheral edema    Pneumonia 1992   hx of   Sarcoidosis    Shortness of breath    Urinary frequency    Weakness    left leg.Numbness and tingling.Has sciatica   Wheeze    occaionally. Not new    Family History  Problem Relation Age of Onset   Breast cancer Mother    CVA Mother    Breast cancer Sister    Breast cancer Sister    Heart attack Neg Hx    Sleep apnea Neg Hx     Past Surgical History:  Procedure Laterality Date   ABDOMINAL HYSTERECTOMY     APPENDECTOMY     BREAST BIOPSY     biopsies on right x 2   BREAST CYST ASPIRATION Left    CHOLECYSTECTOMY N/A 11/29/2015   Procedure: LAPAROSCOPIC CHOLECYSTECTOMY;  Surgeon: Jina Nephew, MD;  Location: [facility] OR;  Service: General;  Laterality: N/A;    COLONOSCOPY     DILATION AND CURETTAGE OF UTERUS     LIVER BIOPSY     MELANOMA EXCISION     removed from left leg   scalene surgery  1972   trying to find out what was wrong with her lungs   THYROID  SURGERY     Social History   Occupational History   Occupation: Retired    Associate Professor: RETIRED  Tobacco Use  Smoking status: Never   Smokeless tobacco: Never  Vaping Use   Vaping status: Never Used  Substance and Sexual Activity   Alcohol  use: No   Drug use: No   Sexual activity: Not on file

## 2023-12-31 ENCOUNTER — Encounter: Payer: Self-pay | Admitting: Cardiology

## 2023-12-31 ENCOUNTER — Ambulatory Visit: Attending: Cardiology | Admitting: Cardiology

## 2023-12-31 VITALS — BP 138/70 | HR 76 | Resp 16 | Ht 64.0 in | Wt 240.0 lb

## 2023-12-31 DIAGNOSIS — E782 Mixed hyperlipidemia: Secondary | ICD-10-CM | POA: Diagnosis not present

## 2023-12-31 DIAGNOSIS — E66813 Obesity, class 3: Secondary | ICD-10-CM | POA: Diagnosis not present

## 2023-12-31 DIAGNOSIS — Z6841 Body Mass Index (BMI) 40.0 and over, adult: Secondary | ICD-10-CM

## 2023-12-31 DIAGNOSIS — I4891 Unspecified atrial fibrillation: Secondary | ICD-10-CM | POA: Diagnosis not present

## 2023-12-31 DIAGNOSIS — I10 Essential (primary) hypertension: Secondary | ICD-10-CM | POA: Diagnosis not present

## 2023-12-31 MED ORDER — FUROSEMIDE 20 MG PO TABS: 40.0000 mg | ORAL_TABLET | Freq: Every day | ORAL | Status: DC

## 2023-12-31 NOTE — Patient Instructions (Signed)
 Medication Instructions:  No medication changes were made at this visit. Continue current regimen.   *If you need a refill on your cardiac medications before your next appointment, please call your pharmacy*  Follow-Up: At [facility], you and your health needs are our priority.  As part of our continuing mission to provide you with exceptional heart care, our providers are all part of one team.  This team includes your primary Cardiologist (physician) and Advanced Practice Providers or APPs (Physician Assistants and Nurse Practitioners) who all work together to provide you with the care you need, when you need it.  Your next appointment:   1 year(s)  Provider:   Madonna Large, DO    We recommend signing up for the patient portal called MyChart.  Sign up information is provided on this After Visit Summary.  MyChart is used to connect with patients for Virtual Visits (Telemedicine).  Patients are able to view lab/test results, encounter notes, upcoming appointments, etc.  Non-urgent messages can be sent to your provider as well.   To learn more about what you can do with MyChart, go to [email or web address].

## 2023-12-31 NOTE — Progress Notes (Signed)
 Cardiology Office Note:  .   Date:  12/31/2023  ID:  [name], DOB [birth year], MRN [id number] PCP:  [name], MD  Former Cardiology Providers: Dr. Candyce Reek [facility] Providers Cardiologist:  Candyce Reek, MD , [facility] (established care 12/31/23) Electrophysiologist:  None  Click to update primary MD,subspecialty MD or APP then REFRESH:1}    Chief Complaint  Patient presents with   Hypertension   Atrial Fibrillation   Follow-up    History of Present Illness: .   [name] is a 83 y.o. Caucasian female whose past medical history and cardiovascular risk factors includes: Atrial fibrillation, History of DVT/PE 2008, IBS, history of ischemic colitis, polycythemia.  Formally under the care of Dr. Candyce Reek who last saw [name] back in January 12, 2019 for. I am seeing her for the first time to re-establishing care.   Atrial fibrillation: Patient states that she was likely diagnosed in 2020. No prior history of direct-current cardioversion or atrial fibrillation ablation.  Patient states that her PCP had recommended against cardioversion in the past and she also feels the same. Currently on rate control strategy and oral anticoagulation for thromboembolic prophylaxis She follows up with PCP for INR checks and targets goals between 2-3 Based on Dr. Candyce Reek progress note patient was on Xarelto  in the past but for reasons unknown he was transitioned back to Coumadin . Patient does not endorse evidence of bleeding. She does not appreciate when she goes in and out of A-fib. Patient states that when she checks her blood pressures her cuff is able to detect irregular rhythm.  She has days when she is not alerted for the irregularity.  Review of Systems: .   Review of Systems  Cardiovascular:  Negative for chest pain, claudication, irregular heartbeat, leg swelling, near-syncope, orthopnea, palpitations, paroxysmal  nocturnal dyspnea and syncope.  Respiratory:  Negative for shortness of breath.   Hematologic/Lymphatic: Negative for bleeding problem.    Studies Reviewed:   EKG: EKG Interpretation Date/Time:  Thursday December 31 2023 08:43:50 EDT Ventricular Rate:  68 PR Interval:    QRS Duration:  82 QT Interval:  412 QTC Calculation: 438 R Axis:   94  Text Interpretation: Atrial fibrillation CONTROLLED VENTRICULAR RESPONSE Rightward axis Low voltage QRS When compared with ECG of 13-Aug-2023 11:25, No significant change since last tracing Confirmed by Michele Richardson [phone]) on 12/31/2023 8:51:20 AM  Echocardiogram: 09/2022  1. Poor echo windows   2. Challenging study despite using Definity  contrast. Tachycardia and  poor echo windows limit accurate assessment of LVEF. Left ventricular  ejection fraction, by estimation, is 40 to 50%. The left ventricle has  mildly decreased function. Left  ventricular endocardial border not optimally defined to evaluate regional  wall motion despite using Definity  contrast. Recommend obtaining limited  echo with contrast when tachycardia is resolved. There is mild left  ventricular hypertrophy. Indeterminate  diastolic filling due to E-A fusion.   3. Right ventricular systolic function is mildly reduced. The right  ventricular size is moderately enlarged. Tricuspid regurgitation signal is  inadequate for assessing PA pressure.   4. The mitral valve was not well visualized. No evidence of mitral valve  regurgitation. No evidence of mitral stenosis.   5. The aortic valve is tricuspid. Aortic valve regurgitation is not  visualized. No aortic stenosis is present.   09/2022 limited  1. Left ventricular ejection fraction, by estimation, is 55 to 60%. The left ventricle has normal function. The left ventricle  has no regional  wall motion abnormalities.   2. Right ventricular systolic function is normal. The right ventricular size is not well visualized.   3. Left  atrial size was mildly dilated.   4. The mitral valve is normal in structure. No evidence of mitral stenosis.   5. Aortic valve regurgitation is not visualized. Aortic valve sclerosis/calcification is present, without any evidence of aortic stenosis.   Comparison(s): Prior images reviewed side by side. The left ventricular function has improved. No longer in atrial fibrillation.   RADIOLOGY: NA  Risk Assessment/Calculations:   Click Here to Calculate/Change CHADS2VASc Score The patient's CHADS2-VASc score is 5, indicating a 7.2% annual risk of stroke.    Labs:       Latest Ref Rng & Units 08/13/2023   10:46 AM 09/26/2022    3:58 AM 09/25/2022    4:24 AM  CBC  WBC 4.0 - 10.5 K/uL 9.4  8.9  8.4   Hemoglobin 12.0 - 15.0 g/dL 86.8  85.4  86.4   Hematocrit 36.0 - 46.0 % 40.9  45.3  42.1   Platelets 150 - 400 K/uL 204  311  303        Latest Ref Rng & Units 08/13/2023   10:57 AM 01/12/2023   10:10 AM 09/26/2022    3:58 AM  BMP  Glucose 70 - 99 mg/dL 858  896  882   BUN 8 - 23 mg/dL 16  14  22    Creatinine 0.44 - 1.00 mg/dL 9.18  9.19  9.11   BUN/Creat Ratio 12 - 28  18    Sodium 135 - 145 mmol/L 140  144  135   Potassium 3.5 - 5.1 mmol/L 3.4  4.0  4.0   Chloride 98 - 111 mmol/L 104  102  97   CO2 22 - 32 mmol/L 25  27  30    Calcium  8.9 - 10.3 mg/dL 9.2  9.7  8.9       Latest Ref Rng & Units 08/13/2023   10:57 AM 01/12/2023   10:10 AM 01/12/2023   10:08 AM  CMP  Glucose 70 - 99 mg/dL 858  896    BUN 8 - 23 mg/dL 16  14    Creatinine 9.55 - 1.00 mg/dL 9.18  9.19    Sodium 864 - 145 mmol/L 140  144    Potassium 3.5 - 5.1 mmol/L 3.4  4.0    Chloride 98 - 111 mmol/L 104  102    CO2 22 - 32 mmol/L 25  27    Calcium  8.9 - 10.3 mg/dL 9.2  9.7    Total Protein 6.5 - 8.1 g/dL 8.1   7.8   Total Bilirubin 0.0 - 1.2 mg/dL 0.7   0.7   Alkaline Phos 38 - 126 U/L 85   104   AST 15 - 41 U/L 20   21   ALT 0 - 44 U/L 20   17     Lab Results  Component Value Date   CHOL 242 (H) 01/12/2023    HDL 62 01/12/2023   LDLCALC 157 (H) 01/12/2023   TRIG 130 01/12/2023   CHOLHDL 3.9 01/12/2023   No results for input(s): LIPOA in the last 8760 hours. No components found for: NTPROBNP No results for input(s): PROBNP in the last 8760 hours. No results for input(s): TSH in the last 8760 hours.  External Labs: Collected: August 26, 2023 [facility] database. Total cholesterol 189, triglycerides 117, HDL 48, LDL 118 Serum  creatinine 0.81. Potassium 3.9.  Collected: Oct 08, 2023 [facility] database. TSH 4.28  Physical Exam:    Today's Vitals   12/31/23 0834  BP: 138/70  Pulse: 76  Resp: 16  SpO2: 95%  Weight: 240 lb (108.9 kg)  Height: 5' 4 (1.626 m)   Body mass index is 41.2 kg/m. Wt Readings from Last 3 Encounters:  12/31/23 240 lb (108.9 kg)  08/18/23 249 lb (112.9 kg)  08/13/23 240 lb (108.9 kg)    Physical Exam  Constitutional: No distress.  hemodynamically stable  Neck: No JVD present.  Cardiovascular: Normal rate, S1 normal and S2 normal. An irregularly irregular rhythm present. Exam reveals no gallop, no S3 and no S4.  No murmur heard. Pulmonary/Chest: Effort normal and breath sounds normal. No stridor. She has no wheezes. She has no rales.  Musculoskeletal:        General: Edema present.     Cervical back: Neck supple.     Comments: Ace wraps  Skin: Skin is warm.     Impression & Recommendation(s):  Impression:   ICD-10-CM   1. Atrial fibrillation with controlled ventricular rate (HCC)  I48.91     2. Essential hypertension, benign  I10 EKG 12-Lead    3. Mixed hyperlipidemia  E78.2     4. Class 3 severe obesity due to excess calories with serious comorbidity and body mass index (BMI) of 40.0 to 44.9 in adult  E66.813    Z68.41        Recommendation(s):  Atrial fibrillation with controlled ventricular rate (HCC) Rate control: Diltiazem  and Nebivolol  Rhythm control: N/A. Thromboembolic prophylaxis: Coumadin  Has not had a history of cardioversion or  atrial fibrillation ablation. Chooses not to be on antiarrhythmic medications. Will focus on rate control strategy as she is tolerating it well. Was on Xarelto  in the past but discontinued for reasons unknown Risks, benefits, and alternatives to anticoagulation discussed INR currently being managed by PCP, per patient with a goal INR between 2-3 Monitor for now  Essential hypertension, benign Office blood pressures are well-controlled. Takes Lasix  20 mg p.o. daily. Continue Avapro  300 mg p.o. daily. Continue Nebivolol  5 mg p.o. daily Amlodipine was discontinued due to lower extremity swelling Patient tried the generic ARB but had to be transition back to Avapro  in the past  Mixed hyperlipidemia Most recent LDL 118 mg/dL. Would recommend lipid-lowering agents, patient to discuss with PCP  Class 3 severe obesity due to excess calories with serious comorbidity and body mass index (BMI) of 40.0 to 44.9 in adult Body mass index is 41.2 kg/m. I reviewed with her importance of diet, regular physical activity/exercise, weight loss.   Patient is educated on the importance of increasing physical activity gradually as tolerated with a goal of moderate intensity exercise for 30 minutes a day 5 days a week.  Patient informs me that they are about to celebrate their 65th anniversary tomorrow and they are very excited.  Congratulated them for their milestone.  Medical decision making: Discussed management of at least 2 chronic comorbid conditions. Husband provided collateral history. Reviewed the last progress note from Dr. Candyce Reek 01/12/2023. Independently reviewed labs dated 08/26/2023. EKG reviewed from 12/31/2023 Prescription drug management   Orders Placed:  Orders Placed This Encounter  Procedures   EKG 12-Lead     Final Medication List:    Meds ordered this encounter  Medications   furosemide  (LASIX ) 20 MG tablet    Sig: Take 2 tablets (40 mg total) by mouth daily.  Medications Discontinued During This Encounter  Medication Reason   furosemide  (LASIX ) 40 MG tablet      Current Outpatient Medications:    acetaminophen  (TYLENOL ) 325 MG tablet, Take 325-650 mg by mouth 2 (two) times daily as needed for mild pain or headache., Disp: , Rfl:    Carboxymethylcellulose Sodium (REFRESH LIQUIGEL OP), Place 1 drop into both eyes at bedtime., Disp: , Rfl:    diltiazem  (TIAZAC ) 360 MG 24 hr capsule, Take 360 mg by mouth at bedtime., Disp: , Rfl:    irbesartan  (AVAPRO ) 300 MG tablet, Take 1 tablet (300 mg total) by mouth daily., Disp: 30 tablet, Rfl: 0   levothyroxine  (SYNTHROID , LEVOTHROID) 100 MCG tablet, Take 100 mcg by mouth daily before breakfast. , Disp: , Rfl: 1   LORazepam  (ATIVAN ) 1 MG tablet, Take 1.5 mg by mouth See admin instructions. Take 1.5 mg by mouth at bedtime and an additional 1-1.5mg  once a day as needed for anxiety, Disp: , Rfl:    Lutein-Zeaxanthin 25-5 MG CAPS, Take 1 capsule by mouth daily with breakfast., Disp: , Rfl:    nebivolol  (BYSTOLIC ) 5 MG tablet, Take 1 tablet (5 mg total) by mouth daily., Disp: 30 tablet, Rfl: 0   nystatin  (MYCOSTATIN /NYSTOP ) powder, Apply 1 Application topically 3 (three) times daily., Disp: 30 g, Rfl: 0   OXYGEN , Inhale 3.5 L/min into the lungs at bedtime., Disp: , Rfl:    Potassium Chloride  ER 20 MEQ TBCR, Take 1 tablet by mouth daily., Disp: , Rfl:    PRESCRIPTION MEDICATION, CPAP- At bedtime, Disp: , Rfl:    SIMPLY SALINE NA, Place 1 spray into both nostrils as needed (for dryness)., Disp: , Rfl:    triamcinolone cream (KENALOG) 0.1 %, Apply 1 Application topically See admin instructions. Apply to both legs 2 times a day, Disp: , Rfl:    warfarin (COUMADIN ) 5 MG tablet, Take 5 mg by mouth at bedtime., Disp: , Rfl: 2   furosemide  (LASIX ) 20 MG tablet, Take 2 tablets (40 mg total) by mouth daily., Disp: , Rfl:   Consent:   NA  Disposition:   1 year or sooner if needed  Her questions and concerns were  addressed to her satisfaction. She voices understanding of the recommendations provided during this encounter.    Signed, Madonna Michele HAS, [facility]  A Division of [facility] [address] [place], [place] [address]  [place], [place] [address] 12/31/2023 9:13 AM

## 2024-01-04 DIAGNOSIS — G4733 Obstructive sleep apnea (adult) (pediatric): Secondary | ICD-10-CM | POA: Diagnosis not present

## 2024-01-05 ENCOUNTER — Other Ambulatory Visit (INDEPENDENT_AMBULATORY_CARE_PROVIDER_SITE_OTHER): Payer: Self-pay

## 2024-01-05 ENCOUNTER — Ambulatory Visit (INDEPENDENT_AMBULATORY_CARE_PROVIDER_SITE_OTHER): Admitting: Physician Assistant

## 2024-01-05 DIAGNOSIS — M25552 Pain in left hip: Secondary | ICD-10-CM | POA: Diagnosis not present

## 2024-01-05 NOTE — Progress Notes (Signed)
 Office Visit Note   Patient: [name]           Date of Birth: 05-03-1941           MRN: [id number] Visit Date: 01/05/2024              Requested by: [name], MD [address] [place],  [place] [address] PCP: [name], MD   Assessment & Plan: Visit Diagnoses:  1. Pain in left hip     Plan: Impression is chronic left hip and low back pain.  Previous trochanteric bursa injection provided decent relief but only lasted for couple days.  I feel she does have symptoms are only from the trochanteric bursa but also the left lower back.  I have discussed referral to Dr. Burnetta for ultrasound-guided trochanteric bursa injection of the left hip.  If she does not notice relief in symptoms, she will follow-up with Megan to further look into her lumbar spine.  Otherwise, follow-up with us  as needed.  Follow-Up Instructions: Return if symptoms worsen or fail to improve.   Orders:  Orders Placed This Encounter  Procedures   XR Lumbar Spine 2-3 Views   No orders of the defined types were placed in this encounter.     Procedures: No procedures performed   Clinical Data: No additional findings.   Subjective: Chief Complaint  Patient presents with   Left Hip - Pain   Lower Back - Pain    HPI patient is a pleasant 83 year old female who comes in today with continued left hip pain.  She was seen in our office about 3 weeks ago her left hip trochanteric bursa injection was performed.  She had what sounds like decent relief but unfortunately only lasted a few days..  She continues to have pain the left lateral hip.  She also notes pain in the left lower back which radiates into the buttock and occasionally down the left leg.  She does note periodic episodes of her back catching as well as occasional weakness to the left lower extremity.  Symptoms appear to be worse when she is walking forward.  She does note paresthesias of the left leg which have been unchanged  following sciatic nerve damage when she was 83 years old.  She has been taking around-the-clock Tylenol  with relief.  Review of Systems as detailed in HPI.  All others reviewed and are negative.   Objective: Vital Signs: There were no vitals taken for this visit.  Physical Exam well-developed well-nourished female in no acute distress.  Alert and oriented x 3.  Ortho Exam left hip exam: Tenderness at the greater trochanter.  No pain with FADIR or logroll testing.  Lumbar spine exam: She does have significant tenderness to the paraspinous musculature left lower lumbar levels.  She has increased pain with lumbar extension.  No focal weakness.  Negative straight leg raise.  She is neurovascularly intact distally.  Specialty Comments:  No specialty comments available.  Imaging: XR Lumbar Spine 2-3 Views Result Date: 01/05/2024 Moderate multilevel degenerative changes    PMFS History: Patient Active Problem List   Diagnosis Date Noted   Heart failure with preserved ejection fraction (HCC) 09/26/2022   Permanent atrial fibrillation (HCC) 09/26/2022   Warfarin anticoagulation 09/18/2022   Sepsis (HCC) 09/18/2022   Sepsis due to cellulitis (HCC) 09/18/2022   Cellulitis of right leg 09/18/2022   Chronic intermittent hypoxia with obstructive sleep apnea 09/12/2021   Severe obstructive sleep apnea-hypopnea syndrome 09/12/2021  Acute medial meniscus tear of right knee 03/31/2018   Acute pain of right knee 01/12/2018   Hypercoagulation syndrome (HCC) 05/24/2014   Hunter's glossitis 05/24/2014   Sarcoidosis 05/24/2014   Hypoxemia 05/24/2014   Primary hypertension 10/13/2013   Nocturnal hypoxia 05/19/2013   Obstructive sleep apnea (adult) (pediatric) 05/19/2013   Obesity 05/19/2013   OSA on CPAP    Adenopathy 05/26/2012   Cough 05/20/2012   OSA (obstructive sleep apnea) 05/20/2012   Pulmonary embolism (HCC) 04/29/2012   DVT (deep venous thrombosis) (HCC) 04/29/2012   Past Medical  History:  Diagnosis Date   Anxiety    takes Lorazepam  daily as needed   Arthritis    Cancer (HCC)    thyroid    Cataracts, bilateral    immature   Chronic back pain    compressed vertebrae,takes Fosamax weekly   Family history of adverse reaction to anesthesia    granddaughter gets very sick and mean   History of blood transfusion    no abnormal reaction noted   History of bronchitis 05/2015   History of colon polyps    benign   Hyperlipidemia    takes Zetia  daily   Hypertension    takes Diltiazem  and Avapro  daily   Hypothyroidism    takes Synthroid  daily   Ischemic colitis (HCC)    hx of-many yrs ago   Joint pain    Joint swelling    OSA on CPAP    05-08-12 AHi was 46, RDI  53. titrated to   9 cm water  with 3 cm flex.-nadir 75%   PE (pulmonary embolism)    takes Coumadin  daily   Peripheral edema    Pneumonia 1992   hx of   Sarcoidosis    Shortness of breath    Urinary frequency    Weakness    left leg.Numbness and tingling.Has sciatica   Wheeze    occaionally. Not new    Family History  Problem Relation Age of Onset   Breast cancer Mother    CVA Mother    Breast cancer Sister    Breast cancer Sister    Heart attack Neg Hx    Sleep apnea Neg Hx     Past Surgical History:  Procedure Laterality Date   ABDOMINAL HYSTERECTOMY     APPENDECTOMY     BREAST BIOPSY     biopsies on right x 2   BREAST CYST ASPIRATION Left    CHOLECYSTECTOMY N/A 11/29/2015   Procedure: LAPAROSCOPIC CHOLECYSTECTOMY;  Surgeon: Jina Nephew, MD;  Location: [facility] OR;  Service: General;  Laterality: N/A;   COLONOSCOPY     DILATION AND CURETTAGE OF UTERUS     LIVER BIOPSY     MELANOMA EXCISION     removed from left leg   scalene surgery  1972   trying to find out what was wrong with her lungs   THYROID  SURGERY     Social History   Occupational History   Occupation: Retired    Associate Professor: RETIRED  Tobacco Use   Smoking status: Never   Smokeless tobacco: Never  Vaping Use    Vaping status: Never Used  Substance and Sexual Activity   Alcohol  use: No   Drug use: No   Sexual activity: Not on file

## 2024-01-19 ENCOUNTER — Ambulatory Visit: Admitting: Sports Medicine

## 2024-01-19 ENCOUNTER — Other Ambulatory Visit: Payer: Self-pay

## 2024-01-19 ENCOUNTER — Encounter: Payer: Self-pay | Admitting: Sports Medicine

## 2024-01-19 DIAGNOSIS — M7062 Trochanteric bursitis, left hip: Secondary | ICD-10-CM

## 2024-01-19 DIAGNOSIS — M25552 Pain in left hip: Secondary | ICD-10-CM

## 2024-01-19 MED ORDER — LIDOCAINE HCL 1 % IJ SOLN
2.0000 mL | INTRAMUSCULAR | Status: AC | PRN
  Administered 2024-01-19 (×2): 2 mL

## 2024-01-19 MED ORDER — BETAMETHASONE SOD PHOS & ACET 6 (3-3) MG/ML IJ SUSP
6.0000 mg | INTRAMUSCULAR | Status: AC | PRN
  Administered 2024-01-19 (×2): 6 mg via INTRA_ARTICULAR

## 2024-01-19 MED ORDER — BUPIVACAINE HCL 0.25 % IJ SOLN
2.0000 mL | INTRAMUSCULAR | Status: AC | PRN
  Administered 2024-01-19 (×2): 2 mL via INTRA_ARTICULAR

## 2024-01-19 NOTE — Progress Notes (Signed)
   Procedure Note  Patient: [name]             Date of Birth: Mar 17, 1941           MRN: [id number]             Visit Date: 01/19/2024  Procedures: Visit Diagnoses:  1. Greater trochanteric bursitis of left hip   2. Pain in left hip    Large Joint Inj: L greater trochanter on 01/19/2024 8:57 AM Indications: pain Details: 22 G 3.5 in needle, ultrasound-guided lateral approach Medications: 2 mL lidocaine  1 %; 2 mL bupivacaine  0.25 %; 6 mg betamethasone  acetate-betamethasone  sodium phosphate  6 (3-3) MG/ML Outcome: tolerated well, no immediate complications  US -Guided Greater Trochanteric Bursa Injection, Left After discussion on risks/benefits/indications and informed verbal consent was obtained, a timeout was performed. The patient was lying in lateral recumbent position on exam table. Using ultrasound guidance, the greater trochanter was identified. The area overlying the trochanteric bursa was then prepped with Betadine and alcohol  swabs. Following sterile precautions, ultrasound was reapplied to visualize needle guidance with a 22-gauge 3.5 needle utilizing an in-plane approach to inject the bursa with 2:2:1 lidocaine :bupivicaine:betamethasone . Delivery of the injectate was visualized into the region of hypoechoic fluid of the greater trochanteric bursa. Patient tolerated procedure well without immediate complications.    Procedure, treatment alternatives, risks and benefits explained, specific risks discussed. Consent was given by the patient. Immediately prior to procedure a time out was called to verify the correct patient, procedure, equipment, support staff and site/side marked as required. Patient was prepped and draped in the usual sterile fashion.     - patient tolerated procedure well, discussed post-injection protocol - follow-up with [name] and [name] as indicated; I am happy to see them as needed - Discussed with [name] waiting between 10-14 days status  postinjection, if she receives greater than 50% pain relief, this is more indicative of coming from the hip and will follow-up as such.  If she does not receive much relief or less than 50% relief from the injection, she will make an appointment with [name] to evaluate the spine and low back further  Linda Sprang, DO Primary Care Sports Medicine Physician  [facility]  This note was dictated using Dragon naturally speaking software and may contain errors in syntax, spelling, or content which have not been identified prior to signing this note.

## 2024-01-20 DIAGNOSIS — Z7901 Long term (current) use of anticoagulants: Secondary | ICD-10-CM | POA: Diagnosis not present

## 2024-01-20 DIAGNOSIS — Z86711 Personal history of pulmonary embolism: Secondary | ICD-10-CM | POA: Diagnosis not present

## 2024-01-20 DIAGNOSIS — I80209 Phlebitis and thrombophlebitis of unspecified deep vessels of unspecified lower extremity: Secondary | ICD-10-CM | POA: Diagnosis not present

## 2024-01-27 ENCOUNTER — Encounter (INDEPENDENT_AMBULATORY_CARE_PROVIDER_SITE_OTHER): Payer: Medicare HMO | Admitting: Ophthalmology

## 2024-01-29 ENCOUNTER — Telehealth: Payer: Self-pay | Admitting: Sports Medicine

## 2024-02-22 ENCOUNTER — Encounter: Payer: Self-pay | Admitting: Physical Medicine and Rehabilitation

## 2024-02-22 ENCOUNTER — Ambulatory Visit: Admitting: Physical Medicine and Rehabilitation

## 2024-02-22 DIAGNOSIS — M5441 Lumbago with sciatica, right side: Secondary | ICD-10-CM

## 2024-02-22 DIAGNOSIS — R269 Unspecified abnormalities of gait and mobility: Secondary | ICD-10-CM

## 2024-02-22 DIAGNOSIS — M5416 Radiculopathy, lumbar region: Secondary | ICD-10-CM | POA: Diagnosis not present

## 2024-02-22 DIAGNOSIS — G8929 Other chronic pain: Secondary | ICD-10-CM | POA: Diagnosis not present

## 2024-02-22 DIAGNOSIS — M5442 Lumbago with sciatica, left side: Secondary | ICD-10-CM | POA: Diagnosis not present

## 2024-02-22 NOTE — Progress Notes (Unsigned)
 Pain Scale   Average Pain 8 Patient advising she has lower back pain radiating bilaterally to butt cheeks. Pain is constant        +Driver, -BT, -Dye Allergies.

## 2024-02-22 NOTE — Progress Notes (Unsigned)
 [name] - 83 y.o. female MRN [id number]  Date of birth: 05-Aug-1940  Office Visit Note: Visit Date: 02/22/2024 PCP: [name], MD Referred by: [name], MD  Subjective: Chief Complaint  Patient presents with   Lower Back - Pain   HPI: [name] is a 83 y.o. female who comes in today as a self referral for evaluation of chronic, worsening and severe bilateral lower back pain radiating to buttocks. Pain ongoing for several years. Her pain worsens with prolonged standing and walking. States she is unable to perform household chores such as sweeping, mopping and cooking. She describes her pain as constant aching sensation, currently rates as 9 out of 10. Some relief of pain with home exercise regimen, rest and use of medications. Recent radiographs of lumbar spine shows moderate multi level degenerative changes. She underwent left greater trochanter injection with Dr. Burnetta on 01/19/2024. This injection did not provide her with significant relief of pain. She is managed by Dr. Jerri from orthopedic standpoint. No history of lumbar surgery/injections. She is currently using walker to assist with ambulation. No recent trauma or falls.  Patients course is complicated by pulmonary embolism, heart failure, atrial fibrillation, OSA and morbid obesity. She is currently taking Coumadin .      Review of Systems  Musculoskeletal:  Positive for back pain.  Neurological:  Negative for tingling, sensory change, focal weakness and weakness.  All other systems reviewed and are negative.  Otherwise per HPI.  Assessment & Plan: Visit Diagnoses:    ICD-10-CM   1. Chronic bilateral low back pain with bilateral sciatica  M54.42 MR LUMBAR SPINE WO CONTRAST   M54.41    G89.29     2. Lumbar radiculopathy  M54.16 MR LUMBAR SPINE WO CONTRAST    3. Gait abnormality  R26.9 MR LUMBAR SPINE WO CONTRAST       Plan: Findings:  Chronic, worsening and severe bilateral lower  back pain radiating to buttocks. Patient continues to have severe pain despite good conservative therapies such as home exercise regimen, rest and use of medications. Patients clinical presentation and exam are consistent with lumbar radiculopathy. I am concerned about central canal stenosis as her symptoms do fit with neurogenic claudication. We discussed treatment plan in detail today. Next step is to place order for lumbar MRI imaging. Depending on results of MRI imaging we discussed possibility of performing lumbar epidural steroid injection. I explained to patient that lumbar epidural steroid injections are similar to greater trochanter injection she underwent with Dr. Burnetta. We will see her back for lumbar MRI review. She can continue with current medication regimen. She is taking Coumadin , will keep this in mind if proceeding with interlaminar injection. No red flag symptoms noted upon exam today.     Meds & Orders: No orders of the defined types were placed in this encounter.   Orders Placed This Encounter  Procedures   MR LUMBAR SPINE WO CONTRAST    Follow-up: Return for Lumbar MRI review.   Procedures: No procedures performed      Clinical History: No specialty comments available.   She reports that she has never smoked. She has never used smokeless tobacco. No results for input(s): HGBA1C, LABURIC in the last 8760 hours.  Objective:  VS:  HT:    WT:   BMI:     BP:   HR: bpm  TEMP: ( )  RESP:  Physical Exam Vitals and nursing note reviewed.  HENT:  Head: Normocephalic and atraumatic.     Right Ear: External ear normal.     Left Ear: External ear normal.     Nose: Nose normal.     Mouth/Throat:     Mouth: Mucous membranes are moist.  Eyes:     Extraocular Movements: Extraocular movements intact.  Cardiovascular:     Rate and Rhythm: Normal rate.     Pulses: Normal pulses.  Pulmonary:     Effort: Pulmonary effort is normal.  Abdominal:     General: Abdomen  is flat. There is no distension.  Musculoskeletal:        General: Tenderness present.     Cervical back: Normal range of motion.     Right lower leg: Edema present.     Left lower leg: Edema present.     Comments: Patient is slow to rise from seated position to standing. Good lumbar range of motion. No pain noted with facet loading. 5/5 strength noted with bilateral hip flexion, knee flexion/extension, ankle dorsiflexion/plantarflexion and EHL. No clonus noted bilaterally. No pain upon palpation of greater trochanters. No pain with internal/external rotation of bilateral hips. Sensation intact bilaterally. Negative slump test bilaterally. Ambulates with walker, gait slow and unsteady.  She has chronic swelling and rash to bilateral lower extremities.     Skin:    General: Skin is warm and dry.     Capillary Refill: Capillary refill takes less than 2 seconds.  Neurological:     Mental Status: She is alert and oriented to person, place, and time.     Gait: Gait abnormal.  Psychiatric:        Mood and Affect: Mood normal.        Behavior: Behavior normal.     Ortho Exam  Imaging: No results found.  Past Medical/Family/Surgical/Social History: Medications & Allergies reviewed per EMR, new medications updated. Patient Active Problem List   Diagnosis Date Noted   Heart failure with preserved ejection fraction (HCC) 09/26/2022   Permanent atrial fibrillation (HCC) 09/26/2022   Warfarin anticoagulation 09/18/2022   Sepsis (HCC) 09/18/2022   Sepsis due to cellulitis (HCC) 09/18/2022   Cellulitis of right leg 09/18/2022   Chronic intermittent hypoxia with obstructive sleep apnea 09/12/2021   Severe obstructive sleep apnea-hypopnea syndrome 09/12/2021   Acute medial meniscus tear of right knee 03/31/2018   Acute pain of right knee 01/12/2018   Hypercoagulation syndrome (HCC) 05/24/2014   Hunter's glossitis 05/24/2014   Sarcoidosis 05/24/2014   Hypoxemia 05/24/2014   Primary  hypertension 10/13/2013   Nocturnal hypoxia 05/19/2013   Obstructive sleep apnea (adult) (pediatric) 05/19/2013   Obesity 05/19/2013   OSA on CPAP    Adenopathy 05/26/2012   Cough 05/20/2012   OSA (obstructive sleep apnea) 05/20/2012   Pulmonary embolism (HCC) 04/29/2012   DVT (deep venous thrombosis) (HCC) 04/29/2012   Past Medical History:  Diagnosis Date   Anxiety    takes Lorazepam  daily as needed   Arthritis    Cancer (HCC)    thyroid    Cataracts, bilateral    immature   Chronic back pain    compressed vertebrae,takes Fosamax weekly   Family history of adverse reaction to anesthesia    granddaughter gets very sick and mean   History of blood transfusion    no abnormal reaction noted   History of bronchitis 05/2015   History of colon polyps    benign   Hyperlipidemia    takes Zetia  daily   Hypertension    takes Diltiazem   and Avapro  daily   Hypothyroidism    takes Synthroid  daily   Ischemic colitis (HCC)    hx of-many yrs ago   Joint pain    Joint swelling    OSA on CPAP    05-08-12 AHi was 46, RDI  53. titrated to   9 cm water  with 3 cm flex.-nadir 75%   PE (pulmonary embolism)    takes Coumadin  daily   Peripheral edema    Pneumonia 1992   hx of   Sarcoidosis    Shortness of breath    Urinary frequency    Weakness    left leg.Numbness and tingling.Has sciatica   Wheeze    occaionally. Not new   Family History  Problem Relation Age of Onset   Breast cancer Mother    CVA Mother    Breast cancer Sister    Breast cancer Sister    Heart attack Neg Hx    Sleep apnea Neg Hx    Past Surgical History:  Procedure Laterality Date   ABDOMINAL HYSTERECTOMY     APPENDECTOMY     BREAST BIOPSY     biopsies on right x 2   BREAST CYST ASPIRATION Left    CHOLECYSTECTOMY N/A 11/29/2015   Procedure: LAPAROSCOPIC CHOLECYSTECTOMY;  Surgeon: Jina Nephew, MD;  Location: [facility] OR;  Service: General;  Laterality: N/A;   COLONOSCOPY     DILATION AND CURETTAGE OF UTERUS      LIVER BIOPSY     MELANOMA EXCISION     removed from left leg   scalene surgery  1972   trying to find out what was wrong with her lungs   THYROID  SURGERY     Social History   Occupational History   Occupation: Retired    Associate Professor: RETIRED  Tobacco Use   Smoking status: Never   Smokeless tobacco: Never  Vaping Use   Vaping status: Never Used  Substance and Sexual Activity   Alcohol  use: No   Drug use: No   Sexual activity: Not on file

## 2024-02-23 ENCOUNTER — Encounter: Payer: Self-pay | Admitting: Physical Medicine and Rehabilitation

## 2024-02-23 DIAGNOSIS — D689 Coagulation defect, unspecified: Secondary | ICD-10-CM | POA: Diagnosis not present

## 2024-02-23 DIAGNOSIS — Z7901 Long term (current) use of anticoagulants: Secondary | ICD-10-CM | POA: Diagnosis not present

## 2024-02-23 DIAGNOSIS — I80209 Phlebitis and thrombophlebitis of unspecified deep vessels of unspecified lower extremity: Secondary | ICD-10-CM | POA: Diagnosis not present

## 2024-02-24 ENCOUNTER — Other Ambulatory Visit

## 2024-02-29 ENCOUNTER — Encounter: Payer: Self-pay | Admitting: Physical Medicine and Rehabilitation

## 2024-03-08 ENCOUNTER — Other Ambulatory Visit: Payer: Self-pay | Admitting: Physical Medicine and Rehabilitation

## 2024-03-08 DIAGNOSIS — R269 Unspecified abnormalities of gait and mobility: Secondary | ICD-10-CM

## 2024-03-08 DIAGNOSIS — G8929 Other chronic pain: Secondary | ICD-10-CM

## 2024-03-08 DIAGNOSIS — M5416 Radiculopathy, lumbar region: Secondary | ICD-10-CM

## 2024-03-18 ENCOUNTER — Encounter: Payer: Self-pay | Admitting: Rehabilitative and Restorative Service Providers"

## 2024-03-18 ENCOUNTER — Ambulatory Visit: Admitting: Rehabilitative and Restorative Service Providers"

## 2024-03-18 DIAGNOSIS — M5416 Radiculopathy, lumbar region: Secondary | ICD-10-CM | POA: Diagnosis not present

## 2024-03-18 DIAGNOSIS — M6281 Muscle weakness (generalized): Secondary | ICD-10-CM | POA: Diagnosis not present

## 2024-03-18 DIAGNOSIS — M5459 Other low back pain: Secondary | ICD-10-CM

## 2024-03-18 DIAGNOSIS — R293 Abnormal posture: Secondary | ICD-10-CM

## 2024-03-18 DIAGNOSIS — R262 Difficulty in walking, not elsewhere classified: Secondary | ICD-10-CM | POA: Diagnosis not present

## 2024-03-18 NOTE — Therapy (Signed)
 OUTPATIENT PHYSICAL THERAPY THORACOLUMBAR EVALUATION   Patient Name: [name] MRN: [id number] DOB:1940/12/03, 83 y.o., female Today's Date: 03/18/2024  END OF SESSION:  PT End of Session - 03/18/24 1424     Visit Number 1    Number of Visits 16    Date for Recertification  05/13/24    Authorization Type [employer]    Progress Note Due on Visit 10    PT Start Time 1104    PT Stop Time 1147    PT Time Calculation (min) 43 min    Activity Tolerance Patient tolerated treatment well;No increased pain;Patient limited by pain    Behavior During Therapy [facility] for tasks assessed/performed          Past Medical History:  Diagnosis Date   Anxiety    takes Lorazepam  daily as needed   Arthritis    Cancer (HCC)    thyroid    Cataracts, bilateral    immature   Chronic back pain    compressed vertebrae,takes Fosamax weekly   Family history of adverse reaction to anesthesia    granddaughter gets very sick and mean   History of blood transfusion    no abnormal reaction noted   History of bronchitis 05/2015   History of colon polyps    benign   Hyperlipidemia    takes Zetia  daily   Hypertension    takes Diltiazem  and Avapro  daily   Hypothyroidism    takes Synthroid  daily   Ischemic colitis    hx of-many yrs ago   Joint pain    Joint swelling    OSA on CPAP    05-08-12 AHi was 46, RDI  53. titrated to   9 cm water  with 3 cm flex.-nadir 75%   PE (pulmonary embolism)    takes Coumadin  daily   Peripheral edema    Pneumonia 1992   hx of   Sarcoidosis    Shortness of breath    Urinary frequency    Weakness    left leg.Numbness and tingling.Has sciatica   Wheeze    occaionally. Not new   Past Surgical History:  Procedure Laterality Date   ABDOMINAL HYSTERECTOMY     APPENDECTOMY     BREAST BIOPSY     biopsies on right x 2   BREAST CYST ASPIRATION Left    CHOLECYSTECTOMY N/A 11/29/2015   Procedure: LAPAROSCOPIC CHOLECYSTECTOMY;  Surgeon: Jina Nephew, MD;   Location: [facility] OR;  Service: General;  Laterality: N/A;   COLONOSCOPY     DILATION AND CURETTAGE OF UTERUS     LIVER BIOPSY     MELANOMA EXCISION     removed from left leg   scalene surgery  1972   trying to find out what was wrong with her lungs   THYROID  SURGERY     Patient Active Problem List   Diagnosis Date Noted   Heart failure with preserved ejection fraction (HCC) 09/26/2022   Permanent atrial fibrillation (HCC) 09/26/2022   Warfarin anticoagulation 09/18/2022   Sepsis (HCC) 09/18/2022   Sepsis due to cellulitis (HCC) 09/18/2022   Cellulitis of right leg 09/18/2022   Chronic intermittent hypoxia with obstructive sleep apnea 09/12/2021   Severe obstructive sleep apnea-hypopnea syndrome 09/12/2021   Acute medial meniscus tear of right knee 03/31/2018   Acute pain of right knee 01/12/2018   Hypercoagulation syndrome 05/24/2014   Hunter's glossitis 05/24/2014   Sarcoidosis 05/24/2014   Hypoxemia 05/24/2014   Primary hypertension 10/13/2013   Nocturnal hypoxia 05/19/2013  Obstructive sleep apnea (adult) (pediatric) 05/19/2013   Obesity 05/19/2013   OSA on CPAP    Adenopathy 05/26/2012   Cough 05/20/2012   OSA (obstructive sleep apnea) 05/20/2012   Pulmonary embolism (HCC) 04/29/2012   DVT (deep venous thrombosis) (HCC) 04/29/2012    PCP: Toribio MATSU. Yolande, MD  REFERRING PROVIDER: Duwaine BRAVO. Trudy, NP  REFERRING DIAG: M54.42,M54.41,G89.29 (ICD-10-CM) - Chronic bilateral low back pain with bilateral sciatica M54.16 (ICD-10-CM) - Lumbar radiculopathy R26.9 (ICD-10-CM) - Gait abnormality  Rationale for Evaluation and Treatment: Rehabilitation  THERAPY DIAG:  Abnormal posture  Difficulty in walking, not elsewhere classified  Muscle weakness (generalized)  Radiculopathy, lumbar region  Other low back pain  ONSET DATE: December 03, 2023  SUBJECTIVE:                                                                                                                                                                                            SUBJECTIVE STATEMENT: [name] notes that on December 03, 2023 when walking through the kitchen, she felt severe left hip pain.  Symptoms are worst with walking and weightbearing.  Sitting was very bad, but has improved somewhat.  At times, she needs to sit off center to get weight off the left hip/gluteal area.  Her symptoms were bothering her at night, but this has improved.  She had a flare-up 2 nights ago when sleeping in a more elevated position, so sleep is still affected.  PERTINENT HISTORY:  Anxiety, compressed vertebrae, hypothyroid, hypertension, pulmonary embolism, heart failure  PAIN:  Are you having pain? Yes: NPRS scale: 6/10 low back; 6/10 left hip and leg (can be worse) this week Pain location: Low back, left hip and leg Pain description: Ache, sore Aggravating factors: Weight-bearing is worse than sitting or lying supine Relieving factors: Change of position  PRECAUTIONS: Back  RED FLAGS: None   WEIGHT BEARING RESTRICTIONS: No  FALLS:  Has patient fallen in last 6 months? No  LIVING ENVIRONMENT: Lives with: lives with their family and lives with their spouse Lives in: House/apartment Stairs: Uses handrail Has following equipment at home: Environmental consultant - 2 wheeled  OCCUPATION: Retired  PLOF: Independent  PATIENT GOALS: Decrease the hip and leg pain.    NEXT MD VISIT: NA  OBJECTIVE:  Note: Objective measures were completed at Evaluation unless otherwise noted.  DIAGNOSTIC FINDINGS:  Moderate multilevel degenerative changes  X-rays of the pelvis show degenerative spurring of the lateral  trochanteric region of the left proximal femur.    PATIENT SURVEYS:  PSFS: THE PATIENT SPECIFIC FUNCTIONAL SCALE  Place score of 0-10 (0 = unable to perform activity and  10 = able to perform activity at the same level as before injury or problem)  Activity Date: 03/18/2024    Vacuuming 1    2.  Cooking 1     3.  Driving 1    4.  Yard work 1    Total Score 1      Total Score = Sum of activity scores/number of activities  Minimally Detectable Change: 3 points (for single activity); 2 points (for average score)  [employer] (nd). The Patient Specific Functional Scale . Retrieved from [email or web address]   COGNITION: Overall cognitive status: Within functional limits for tasks assessed     SENSATION: Peg notes left sided peripheral symptoms, mostly of the hip/gluteal area and distal from the knee to the foot  MUSCLE LENGTH: Hamstrings: Right 45 deg; Left 40 deg  POSTURE: rounded shoulders, forward head, decreased lumbar lordosis, and flexed trunk   LUMBAR ROM:   AROM eval  Flexion   Extension -20  Right lateral flexion   Left lateral flexion   Right rotation   Left rotation    (Blank rows = not tested)  LOWER EXTREMITY ROM:     Passive  Left/Right 03/18/2024   Hip flexion 80/80   Hip extension    Hip abduction    Hip adduction    Hip internal rotation 18/18   Hip external rotation 14/17   Knee flexion    Knee extension    Ankle dorsiflexion    Ankle plantarflexion    Ankle inversion    Ankle eversion     (Blank rows = not tested)  LOWER EXTREMITY MMT: Deferred secondary to discomfort in test positions  MMT    Hip flexion    Hip extension    Hip abduction    Hip adduction    Hip internal rotation    Hip external rotation    Knee flexion    Knee extension    Ankle dorsiflexion    Ankle plantarflexion    Ankle inversion    Ankle eversion     (Blank rows = not tested)  GAIT: Distance walked: 100 feet Assistive device utilized: Environmental consultant - 2 wheeled Level of assistance: Modified independence Comments: [name] notes that she is limited to standing and walking for short periods of time, only a few minutes  TREATMENT DATE:  03/18/2024 Single knee-to-chest stretch with opposite leg straight 4 x  20 seconds Figure 4 stretch 4 x 20 seconds Standing lumbar extension AROM 10 x 3 seconds  02464: Reviewed imaging; imaging reports; spine anatomy basics; today's examination findings and her day 1 home exercise program                                                                                                                               PATIENT EDUCATION:  Education details: See above Person educated: Patient and Spouse Education method: Explanation, Demonstration, Tactile cues, Verbal cues, and Handouts Education comprehension: verbalized understanding,  returned demonstration, verbal cues required, tactile cues required, and needs further education  HOME EXERCISE PROGRAM: Access Code: [email or web address] URL: [email or web address] Date: 03/18/2024 Prepared by: Lamar Ivory  Exercises - Single Knee to Chest Stretch  - 2-3 x daily - 7 x weekly - 1 sets - 5 reps - 20 seconds hold - Supine Figure 4 Piriformis Stretch  - 2-3 x daily - 7 x weekly - 1 sets - 5 reps - 20 seconds hold - Standing Lumbar Extension at Wall - Forearms  - 5 x daily - 7 x weekly - 1 sets - 5 reps - 3 seconds hold  ASSESSMENT:  CLINICAL IMPRESSION: Patient is a 83 y.o. female who was seen today for physical therapy evaluation and treatment for M54.42,M54.41,G89.29 (ICD-10-CM) - Chronic bilateral low back pain with bilateral sciatica M54.16 (ICD-10-CM) - Lumbar radiculopathy R26.9 (ICD-10-CM) - Gait abnormality.  Lynnleigh had a sudden onset of severe left hip and leg pain when walking in her kitchen in June of this year.  She has had some progress in her symptoms since that time, although she is still very limited in her ability to stand, walk and perform basic ADLs.  Examination today shows postural impairments, limited lumbar extension active range of motion and severely limited bilateral lower extremity flexibility.  [name] almost certainly has strength impairments given how her activities have had to be  curtailed over the last several months due to pain and radicular symptoms.  She was set up on a comprehensive home rehabilitation program to address impairments noted during today's evaluation.  OBJECTIVE IMPAIRMENTS: Abnormal gait, cardiopulmonary status limiting activity, decreased activity tolerance, decreased endurance, decreased knowledge of condition, difficulty walking, decreased ROM, decreased strength, decreased safety awareness, impaired perceived functional ability, increased muscle spasms, impaired flexibility, improper body mechanics, postural dysfunction, obesity, and pain.   ACTIVITY LIMITATIONS: carrying, lifting, bending, standing, sleeping, stairs, bed mobility, and locomotion level  PARTICIPATION LIMITATIONS: meal prep, cleaning, laundry, driving, shopping, community activity, and yard work  PERSONAL FACTORS: Anxiety, compressed vertebrae, hypothyroid, hypertension, pulmonary embolism, heart failure are also affecting patient's functional outcome.   REHAB POTENTIAL: Good  CLINICAL DECISION MAKING: Evolving/moderate complexity  EVALUATION COMPLEXITY: Moderate   GOALS: Goals reviewed with patient? Yes  SHORT TERM GOALS: Target date: 04/15/2024  [name] will be independent with her day 1 home exercise program Baseline: Started 03/18/2024 Goal status: INITIAL  2.  Improve lumbar extension AROM to neutral Baseline: -20 degrees Goal status: INITIAL  3.  Improve bilateral lower extremity flexibility for hip flexors to 90 degrees; hamstrings to 50 degrees and bilateral hip external rotation to 25 degrees Baseline: 80/80; 40/45 and 14/17 respectively Goal status: INITIAL   LONG TERM GOALS: Target date: 05/13/2024  Improve patient-specific functional scale to at least 4 Baseline: 1 Goal status: INITIAL  2.  [name] will report low back and left hip pain consistently 0-3/10 on the numeric pain rating scale Baseline: 6/10 with left radicular symptoms as distal as the  foot Goal status: INITIAL  3.  [name] will be able to stand and walk for at least 10 minutes without being limited by low back, hip radicular symptoms Baseline: Unable at evaluation Goal status: INITIAL  4.  [name] will not require an assistive device for ambulation Baseline: Wheeled walker at evaluation Goal status: INITIAL  5.  [name] will be independent with her long-term home exercise program at discharge Baseline: Started 03/18/2024 Goal status: INITIAL  PLAN:  PT FREQUENCY: 2x/week  PT DURATION: 8 weeks  PLANNED INTERVENTIONS: 97750-  Physical Performance Testing, 97110-Therapeutic exercises, 97530- Therapeutic activity, [id number]- Neuromuscular re-education, 97535- Self Care, [id number]- Manual therapy, [id number]- Gait training, [phone]- Electrical stimulation (unattended), [phone]- Traction (mechanical), [phone] (1-2 muscles), [id number] (3+ muscles)- Dry Needling, Patient/Family education, Spinal mobilization, Cryotherapy, and Moist heat.  PLAN FOR NEXT SESSION: Review day 1 home exercises.  [name] will benefit from postural strengthening, appropriate flexibility exercises to address hamstrings, hip flexion and hip external rotation and appropriate spine strengthening activities.  She will also benefit from postural and body mechanics education to meet long-term goals.   Myer LELON Ivory, PT, MPT 03/18/2024, 2:26 PM

## 2024-03-21 ENCOUNTER — Encounter: Payer: Self-pay | Admitting: Rehabilitative and Restorative Service Providers"

## 2024-03-21 ENCOUNTER — Ambulatory Visit: Admitting: Rehabilitative and Restorative Service Providers"

## 2024-03-21 DIAGNOSIS — M6281 Muscle weakness (generalized): Secondary | ICD-10-CM | POA: Diagnosis not present

## 2024-03-21 DIAGNOSIS — R293 Abnormal posture: Secondary | ICD-10-CM | POA: Diagnosis not present

## 2024-03-21 DIAGNOSIS — R262 Difficulty in walking, not elsewhere classified: Secondary | ICD-10-CM | POA: Diagnosis not present

## 2024-03-21 DIAGNOSIS — M5459 Other low back pain: Secondary | ICD-10-CM | POA: Diagnosis not present

## 2024-03-21 DIAGNOSIS — M5416 Radiculopathy, lumbar region: Secondary | ICD-10-CM

## 2024-03-21 NOTE — Therapy (Signed)
 OUTPATIENT PHYSICAL THERAPY TREATMENT   Patient Name: [name] MRN: [id number] DOB:December 25, 1940, 83 y.o., female Today's Date: 03/21/2024  END OF SESSION:  PT End of Session - 03/21/24 1018     Visit Number 2    Number of Visits 16    Date for Recertification  05/13/24    Authorization Type [employer]    Progress Note Due on Visit 10    PT Start Time 1015    PT Stop Time 1055    PT Time Calculation (min) 40 min    Activity Tolerance Patient tolerated treatment well;Patient limited by fatigue    Behavior During Therapy [facility] for tasks assessed/performed           Past Medical History:  Diagnosis Date   Anxiety    takes Lorazepam  daily as needed   Arthritis    Cancer (HCC)    thyroid    Cataracts, bilateral    immature   Chronic back pain    compressed vertebrae,takes Fosamax weekly   Family history of adverse reaction to anesthesia    granddaughter gets very sick and mean   History of blood transfusion    no abnormal reaction noted   History of bronchitis 05/2015   History of colon polyps    benign   Hyperlipidemia    takes Zetia  daily   Hypertension    takes Diltiazem  and Avapro  daily   Hypothyroidism    takes Synthroid  daily   Ischemic colitis    hx of-many yrs ago   Joint pain    Joint swelling    OSA on CPAP    05-08-12 AHi was 46, RDI  53. titrated to   9 cm water  with 3 cm flex.-nadir 75%   PE (pulmonary embolism)    takes Coumadin  daily   Peripheral edema    Pneumonia 1992   hx of   Sarcoidosis    Shortness of breath    Urinary frequency    Weakness    left leg.Numbness and tingling.Has sciatica   Wheeze    occaionally. Not new   Past Surgical History:  Procedure Laterality Date   ABDOMINAL HYSTERECTOMY     APPENDECTOMY     BREAST BIOPSY     biopsies on right x 2   BREAST CYST ASPIRATION Left    CHOLECYSTECTOMY N/A 11/29/2015   Procedure: LAPAROSCOPIC CHOLECYSTECTOMY;  Surgeon: Linda Nephew, MD;  Location: [facility] OR;  Service:  General;  Laterality: N/A;   COLONOSCOPY     DILATION AND CURETTAGE OF UTERUS     LIVER BIOPSY     MELANOMA EXCISION     removed from left leg   scalene surgery  1972   trying to find out what was wrong with her lungs   THYROID  SURGERY     Patient Active Problem List   Diagnosis Date Noted   Heart failure with preserved ejection fraction (HCC) 09/26/2022   Permanent atrial fibrillation (HCC) 09/26/2022   Warfarin anticoagulation 09/18/2022   Sepsis (HCC) 09/18/2022   Sepsis due to cellulitis (HCC) 09/18/2022   Cellulitis of right leg 09/18/2022   Chronic intermittent hypoxia with obstructive sleep apnea 09/12/2021   Severe obstructive sleep apnea-hypopnea syndrome 09/12/2021   Acute medial meniscus tear of right knee 03/31/2018   Acute pain of right knee 01/12/2018   Hypercoagulation syndrome 05/24/2014   Hunter's glossitis 05/24/2014   Sarcoidosis 05/24/2014   Hypoxemia 05/24/2014   Primary hypertension 10/13/2013   Nocturnal hypoxia 05/19/2013   Obstructive  sleep apnea (adult) (pediatric) 05/19/2013   Obesity 05/19/2013   OSA on CPAP    Adenopathy 05/26/2012   Cough 05/20/2012   OSA (obstructive sleep apnea) 05/20/2012   Pulmonary embolism (HCC) 04/29/2012   DVT (deep venous thrombosis) (HCC) 04/29/2012    PCP: Toribio MATSU. Yolande, MD  REFERRING PROVIDER: Duwaine BRAVO. Trudy, NP  REFERRING DIAG: M54.42,M54.41,G89.29 (ICD-10-CM) - Chronic bilateral low back pain with bilateral sciatica M54.16 (ICD-10-CM) - Lumbar radiculopathy R26.9 (ICD-10-CM) - Gait abnormality  Rationale for Evaluation and Treatment: Rehabilitation  THERAPY DIAG:  Abnormal posture  Difficulty in walking, not elsewhere classified  Muscle weakness (generalized)  Radiculopathy, lumbar region  Other low back pain  ONSET DATE: December 03, 2023  SUBJECTIVE:                                                                                                                                                                                            SUBJECTIVE STATEMENT: Pt indicated hip and whole body felt sore and tired the day after the visit.  Pt indicated working on the exercises without pain increase from doing them.  Pt indicated back feels tired.  Didn't indicated pain upon arrival in sitting.  Reported less pulling in hip.   PERTINENT HISTORY:  Anxiety, compressed vertebrae, hypothyroid, hypertension, pulmonary embolism, heart failure  PAIN:  NPRS scale: today 3/10.  Pain location: Low back, left hip and leg Pain description: Ache, sore Aggravating factors: Weight-bearing is worse than sitting or lying supine Relieving factors: Change of position  PRECAUTIONS: Back  RED FLAGS: None   WEIGHT BEARING RESTRICTIONS: No  FALLS:  Has patient fallen in last 6 months? No  LIVING ENVIRONMENT: Lives with: lives with their family and lives with their spouse Lives in: House/apartment Stairs: Uses handrail Has following equipment at home: Environmental consultant - 2 wheeled  OCCUPATION: Retired  PLOF: Independent  PATIENT GOALS: Decrease the hip and leg pain.    NEXT MD VISIT: NA  OBJECTIVE:  Note: Objective measures were completed at Evaluation unless otherwise noted.  DIAGNOSTIC FINDINGS:  Moderate multilevel degenerative changes  X-rays of the pelvis show degenerative spurring of the lateral  trochanteric region of the left proximal femur.    PATIENT SURVEYS:  PSFS: THE PATIENT SPECIFIC FUNCTIONAL SCALE  Place score of 0-10 (0 = unable to perform activity and 10 = able to perform activity at the same level as before injury or problem)  Activity Date: 03/18/2024    Vacuuming 1    2.  Cooking 1    3.  Driving 1    4.  Yard work 1    Total Score  1      Total Score = Sum of activity scores/number of activities  Minimally Detectable Change: 3 points (for single activity); 2 points (for average score)  [facility] (nd). The Patient Specific Functional Scale .  Retrieved from [email or web address]   COGNITION: 03/18/2024 Overall cognitive status: Within functional limits for tasks assessed     SENSATION: 03/18/2024 [name] notes left sided peripheral symptoms, mostly of the hip/gluteal area and distal from the knee to the foot  MUSCLE LENGTH: 03/18/2024 Hamstrings: Right 45 deg; Left 40 deg  POSTURE:  03/18/2024 rounded shoulders, forward head, decreased lumbar lordosis, and flexed trunk   LUMBAR ROM:   AROM Eval 03/18/2024 03/21/2024  Flexion    Extension -20 25 % without pain complaints.   Right lateral flexion    Left lateral flexion    Right rotation    Left rotation     (Blank rows = not tested)  LOWER EXTREMITY ROM:      Right 03/18/2024 Passive Left 03/18/2024 Passive Right 03/21/2024 Left 03/21/2024  Hip flexion 80 80 PROM in supine 90 PROM in supine 105  Hip extension      Hip abduction      Hip adduction      Hip internal rotation 18 18    Hip external rotation 17 14    Knee flexion      Knee extension      Ankle dorsiflexion      Ankle plantarflexion      Ankle inversion      Ankle eversion       (Blank rows = not tested)  LOWER EXTREMITY MMT:  03/18/2024 Deferred secondary to discomfort in test positions  MMT    Hip flexion    Hip extension    Hip abduction    Hip adduction    Hip internal rotation    Hip external rotation    Knee flexion    Knee extension    Ankle dorsiflexion    Ankle plantarflexion    Ankle inversion    Ankle eversion     (Blank rows = not tested)  GAIT: 03/18/2024 Distance walked: 100 feet Assistive device utilized: Environmental consultant - 2 wheeled Level of assistance: Modified independence Comments: Lucita notes that she is limited to standing and walking for short periods of time, only a few minutes                    TREATMENT          DATE: 03/21/2024 Therex: Standing lumbar extension AROM x 5  Supine hooklying trunk  rotation stretch 20 sec x 3 bilateral Supine SKC 20 sec x 3 bilaterally    Review of HEP.   TherActivity Cues verbally and visually for log rolling techniques in supine to sit to supine bed mobility.  Detailed procedures to improve independence and reduce lumbar strain in mobility.   Supine bridge x 15 1-2 second hold (to improve bed mobility)  Sit to stand to sit x 5 without UE  - 19.5 inch table height.    TREATMENT          DATE:03/18/2024 Single knee-to-chest stretch with opposite leg straight 4 x 20 seconds Figure 4 stretch 4 x 20 seconds Standing lumbar extension AROM 10 x 3 seconds  02464: Reviewed imaging; imaging reports; spine anatomy basics; today's examination findings and her day 1 home exercise program  PATIENT EDUCATION:  03/18/2024 Education details: See above Person educated: Patient and Spouse Education method: Explanation, Demonstration, Tactile cues, Verbal cues, and Handouts Education comprehension: verbalized understanding, returned demonstration, verbal cues required, tactile cues required, and needs further education  HOME EXERCISE PROGRAM: Access Code: [email or web address] URL: [email or web address] Date: 03/18/2024 Prepared by: Lamar Ivory  Exercises - Single Knee to Chest Stretch  - 2-3 x daily - 7 x weekly - 1 sets - 5 reps - 20 seconds hold - Supine Figure 4 Piriformis Stretch  - 2-3 x daily - 7 x weekly - 1 sets - 5 reps - 20 seconds hold - Standing Lumbar Extension at Wall - Forearms  - 5 x daily - 7 x weekly - 1 sets - 5 reps - 3 seconds hold  ASSESSMENT:  CLINICAL IMPRESSION: Good overall recall of initial evaluation presentation.  Fatigue as noted with muscle soreness from activity within clinic on first day so education was given about post activity muscles soreness vs Pain complaints.  Introduced early hip  activation exercise to build strength for mobility gains.  Continued skilled PT services indicated at this time.   OBJECTIVE IMPAIRMENTS: Abnormal gait, cardiopulmonary status limiting activity, decreased activity tolerance, decreased endurance, decreased knowledge of condition, difficulty walking, decreased ROM, decreased strength, decreased safety awareness, impaired perceived functional ability, increased muscle spasms, impaired flexibility, improper body mechanics, postural dysfunction, obesity, and pain.   ACTIVITY LIMITATIONS: carrying, lifting, bending, standing, sleeping, stairs, bed mobility, and locomotion level  PARTICIPATION LIMITATIONS: meal prep, cleaning, laundry, driving, shopping, community activity, and yard work  PERSONAL FACTORS: Anxiety, compressed vertebrae, hypothyroid, hypertension, pulmonary embolism, heart failure are also affecting patient's functional outcome.   REHAB POTENTIAL: Good  CLINICAL DECISION MAKING: Evolving/moderate complexity  EVALUATION COMPLEXITY: Moderate   GOALS: Goals reviewed with patient? Yes  SHORT TERM GOALS: Target date: 04/15/2024  Karissa will be independent with her day 1 home exercise program Baseline: Started 03/18/2024 Goal status: on going 03/21/2024  2.  Improve lumbar extension AROM to neutral Baseline: -20 degrees Goal status: on going 03/21/2024  3.  Improve bilateral lower extremity flexibility for hip flexors to 90 degrees; hamstrings to 50 degrees and bilateral hip external rotation to 25 degrees Baseline: 80/80; 40/45 and 14/17 respectively Goal status: on going 03/21/2024   LONG TERM GOALS: Target date: 05/13/2024  Improve patient-specific functional scale to at least 4 Baseline: 1 Goal status: INITIAL  2.  Alyana will report low back and left hip pain consistently 0-3/10 on the numeric pain rating scale Baseline: 6/10 with left radicular symptoms as distal as the foot Goal status: INITIAL  3.  Mckinzi will be  able to stand and walk for at least 10 minutes without being limited by low back, hip radicular symptoms Baseline: Unable at evaluation Goal status: INITIAL  4.  Lovina will not require an assistive device for ambulation Baseline: Wheeled walker at evaluation Goal status: INITIAL  5.  Metta will be independent with her long-term home exercise program at discharge Baseline: Started 03/18/2024 Goal status: INITIAL  PLAN:  PT FREQUENCY: 2x/week  PT DURATION: 8 weeks  PLANNED INTERVENTIONS: 97750- Physical Performance Testing, 97110-Therapeutic exercises, 97530- Therapeutic activity, [id number]- Neuromuscular re-education, 97535- Self Care, [id number]- Manual therapy, [id number]- Gait training, [phone]- Electrical stimulation (unattended), [id number]- Traction (mechanical), 20560 (1-2 muscles), 20561 (3+ muscles)- Dry Needling, Patient/Family education, Spinal mobilization, Cryotherapy, and Moist heat.  PLAN FOR NEXT SESSION: Progressive hip /LE strengthening as able with mindful progression to post activity soreness reported.    Alena Blankenbeckler  Brien, PT, DPT, OCS, ATC 03/21/24  11:32 AM

## 2024-03-30 ENCOUNTER — Encounter: Payer: Self-pay | Admitting: Rehabilitative and Restorative Service Providers"

## 2024-03-30 ENCOUNTER — Ambulatory Visit: Admitting: Rehabilitative and Restorative Service Providers"

## 2024-03-30 DIAGNOSIS — M6281 Muscle weakness (generalized): Secondary | ICD-10-CM

## 2024-03-30 DIAGNOSIS — R262 Difficulty in walking, not elsewhere classified: Secondary | ICD-10-CM | POA: Diagnosis not present

## 2024-03-30 DIAGNOSIS — Z7901 Long term (current) use of anticoagulants: Secondary | ICD-10-CM | POA: Diagnosis not present

## 2024-03-30 DIAGNOSIS — M5459 Other low back pain: Secondary | ICD-10-CM | POA: Diagnosis not present

## 2024-03-30 DIAGNOSIS — D689 Coagulation defect, unspecified: Secondary | ICD-10-CM | POA: Diagnosis not present

## 2024-03-30 DIAGNOSIS — I80209 Phlebitis and thrombophlebitis of unspecified deep vessels of unspecified lower extremity: Secondary | ICD-10-CM | POA: Diagnosis not present

## 2024-03-30 DIAGNOSIS — R293 Abnormal posture: Secondary | ICD-10-CM | POA: Diagnosis not present

## 2024-03-30 DIAGNOSIS — M5416 Radiculopathy, lumbar region: Secondary | ICD-10-CM

## 2024-03-30 NOTE — Therapy (Signed)
 OUTPATIENT PHYSICAL THERAPY TREATMENT   Patient Name: [name] MRN: [id number] DOB:[birth year], 83 y.o., female Today's Date: 03/30/2024  END OF SESSION:  PT End of Session - 03/30/24 1105     Visit Number 3    Number of Visits 16    Date for Recertification  05/13/24    Authorization Type [employer]    Progress Note Due on Visit 10    PT Start Time 1058    PT Stop Time 1138    PT Time Calculation (min) 40 min    Activity Tolerance Patient limited by fatigue;Patient limited by pain    Behavior During Therapy [facility] for tasks assessed/performed            Past Medical History:  Diagnosis Date   Anxiety    takes Lorazepam  daily as needed   Arthritis    Cancer (HCC)    thyroid    Cataracts, bilateral    immature   Chronic back pain    compressed vertebrae,takes Fosamax weekly   Family history of adverse reaction to anesthesia    granddaughter gets very sick and mean   History of blood transfusion    no abnormal reaction noted   History of bronchitis 05/2015   History of colon polyps    benign   Hyperlipidemia    takes Zetia  daily   Hypertension    takes Diltiazem  and Avapro  daily   Hypothyroidism    takes Synthroid  daily   Ischemic colitis    hx of-many yrs ago   Joint pain    Joint swelling    OSA on CPAP    05-08-12 AHi was 46, RDI  53. titrated to   9 cm water  with 3 cm flex.-nadir 75%   PE (pulmonary embolism)    takes Coumadin  daily   Peripheral edema    Pneumonia 1992   hx of   Sarcoidosis    Shortness of breath    Urinary frequency    Weakness    left leg.Numbness and tingling.Has sciatica   Wheeze    occaionally. Not new   Past Surgical History:  Procedure Laterality Date   ABDOMINAL HYSTERECTOMY     APPENDECTOMY     BREAST BIOPSY     biopsies on right x 2   BREAST CYST ASPIRATION Left    CHOLECYSTECTOMY N/A 11/29/2015   Procedure: LAPAROSCOPIC CHOLECYSTECTOMY;  Surgeon: Jina Nephew, MD;  Location: [facility] OR;  Service:  General;  Laterality: N/A;   COLONOSCOPY     DILATION AND CURETTAGE OF UTERUS     LIVER BIOPSY     MELANOMA EXCISION     removed from left leg   scalene surgery  1972   trying to find out what was wrong with her lungs   THYROID  SURGERY     Patient Active Problem List   Diagnosis Date Noted   Heart failure with preserved ejection fraction (HCC) 09/26/2022   Permanent atrial fibrillation (HCC) 09/26/2022   Warfarin anticoagulation 09/18/2022   Sepsis (HCC) 09/18/2022   Sepsis due to cellulitis (HCC) 09/18/2022   Cellulitis of right leg 09/18/2022   Chronic intermittent hypoxia with obstructive sleep apnea 09/12/2021   Severe obstructive sleep apnea-hypopnea syndrome 09/12/2021   Acute medial meniscus tear of right knee 03/31/2018   Acute pain of right knee 01/12/2018   Hypercoagulation syndrome 05/24/2014   Hunter's glossitis 05/24/2014   Sarcoidosis 05/24/2014   Hypoxemia 05/24/2014   Primary hypertension 10/13/2013   Nocturnal hypoxia 05/19/2013  Obstructive sleep apnea (adult) (pediatric) 05/19/2013   Obesity 05/19/2013   OSA on CPAP    Adenopathy 05/26/2012   Cough 05/20/2012   OSA (obstructive sleep apnea) 05/20/2012   Pulmonary embolism (HCC) 04/29/2012   DVT (deep venous thrombosis) (HCC) 04/29/2012    PCP: Toribio MATSU. Yolande, MD  REFERRING PROVIDER: Duwaine BRAVO. Trudy, NP  REFERRING DIAG: M54.42,M54.41,G89.29 (ICD-10-CM) - Chronic bilateral low back pain with bilateral sciatica M54.16 (ICD-10-CM) - Lumbar radiculopathy R26.9 (ICD-10-CM) - Gait abnormality  Rationale for Evaluation and Treatment: Rehabilitation  THERAPY DIAG:  Abnormal posture  Difficulty in walking, not elsewhere classified  Muscle weakness (generalized)  Radiculopathy, lumbar region  Other low back pain  ONSET DATE: December 03, 2023  SUBJECTIVE:                                                                                                                                                                                            SUBJECTIVE STATEMENT: Pt indicated having constant pain in Lt buttock and into Lt leg.  Reported feeling it after last visit and continued in time since.   PERTINENT HISTORY:  Anxiety, compressed vertebrae, hypothyroid, hypertension, pulmonary embolism, heart failure  PAIN:  NPRS scale: 10/10 Pain location: Low back, left hip and leg Pain description: Ache, sore Aggravating factors: Weight-bearing is worse than sitting or lying supine Relieving factors: Change of position  PRECAUTIONS: Back  RED FLAGS: None   WEIGHT BEARING RESTRICTIONS: No  FALLS:  Has patient fallen in last 6 months? No  LIVING ENVIRONMENT: Lives with: lives with their family and lives with their spouse Lives in: House/apartment Stairs: Uses handrail Has following equipment at home: Environmental consultant - 2 wheeled  OCCUPATION: Retired  PLOF: Independent  PATIENT GOALS: Decrease the hip and leg pain.    NEXT MD VISIT: NA  OBJECTIVE:  Note: Objective measures were completed at Evaluation unless otherwise noted.  DIAGNOSTIC FINDINGS:  Moderate multilevel degenerative changes  X-rays of the pelvis show degenerative spurring of the lateral  trochanteric region of the left proximal femur.    PATIENT SURVEYS:  PSFS: THE PATIENT SPECIFIC FUNCTIONAL SCALE  Place score of 0-10 (0 = unable to perform activity and 10 = able to perform activity at the same level as before injury or problem)  Activity Date: 03/18/2024    Vacuuming 1    2.  Cooking 1    3.  Driving 1    4.  Yard work 1    Total Score 1      Total Score = Sum of activity scores/number of activities  Minimally Detectable Change: 3 points (for single activity); 2  points (for average score)  [facility] (nd). The Patient Specific Functional Scale . Retrieved from [email or web address]   COGNITION: 03/18/2024 Overall cognitive status: Within  functional limits for tasks assessed     SENSATION: 03/18/2024 [name] notes left sided peripheral symptoms, mostly of the hip/gluteal area and distal from the knee to the foot  MUSCLE LENGTH: 03/18/2024 Hamstrings: Right 45 deg; Left 40 deg  POSTURE:  03/18/2024 rounded shoulders, forward head, decreased lumbar lordosis, and flexed trunk   LUMBAR ROM:   AROM Eval 03/18/2024 03/21/2024 03/30/2024  Flexion     Extension -20 25 % without pain complaints.  25% WFL without pain  Repeated x 5 improved to 50%  Right lateral flexion     Left lateral flexion     Right rotation     Left rotation      (Blank rows = not tested)  LOWER EXTREMITY ROM:      Right 03/18/2024 Passive Left 03/18/2024 Passive Right 03/21/2024 Left 03/21/2024  Hip flexion 80 80 PROM in supine 90 PROM in supine 105  Hip extension      Hip abduction      Hip adduction      Hip internal rotation 18 18    Hip external rotation 17 14    Knee flexion      Knee extension      Ankle dorsiflexion      Ankle plantarflexion      Ankle inversion      Ankle eversion       (Blank rows = not tested)  LOWER EXTREMITY MMT:  03/18/2024 Deferred secondary to discomfort in test positions  MMT    Hip flexion    Hip extension    Hip abduction    Hip adduction    Hip internal rotation    Hip external rotation    Knee flexion    Knee extension    Ankle dorsiflexion    Ankle plantarflexion    Ankle inversion    Ankle eversion     (Blank rows = not tested)  GAIT: 03/18/2024 Distance walked: 100 feet Assistive device utilized: Environmental consultant - 2 wheeled Level of assistance: Modified independence Comments: Miku notes that she is limited to standing and walking for short periods of time, only a few minutes                    TREATMENT          DATE: 03/30/2024 Therex: Nustep lvl 5 UE/LE for ROM.  Sidelying Lt hip clam shell 2 x 10  Supine hooklying lumbar trunk rotation stretch 15 sec x 3  bilateral Supine hooklying SKC 20 sec x 3 bilateral with strap assist  Standing lumbar extension AROM x 5   TherActivity Supine to sit to supine transfer education and cues verbally, tactile to promote log rolling techniques. Pt may continue to benefit from continued training in movements.   Manual Compression to Lt glute med/min trigger points, Percussive device to same.  Detailed tennis ball use at home as well.      TREATMENT          DATE: 03/21/2024 Therex: Standing lumbar extension AROM x 5  Supine hooklying trunk rotation stretch 20 sec x 3 bilateral Supine SKC 20 sec x 3 bilaterally    Review of HEP.   TherActivity Cues verbally and visually for log rolling techniques in supine to sit to supine bed mobility.  Detailed procedures to improve independence and reduce  lumbar strain in mobility.   Supine bridge x 15 1-2 second hold (to improve bed mobility)  Sit to stand to sit x 5 without UE  - 19.5 inch table height.    TREATMENT          DATE:03/18/2024 Single knee-to-chest stretch with opposite leg straight 4 x 20 seconds Figure 4 stretch 4 x 20 seconds Standing lumbar extension AROM 10 x 3 seconds  [id number]: Reviewed imaging; imaging reports; spine anatomy basics; today's examination findings and her day 1 home exercise program                                                                                                                               PATIENT EDUCATION:  03/18/2024 Education details: See above Person educated: Patient and Spouse Education method: Explanation, Demonstration, Tactile cues, Verbal cues, and Handouts Education comprehension: verbalized understanding, returned demonstration, verbal cues required, tactile cues required, and needs further education  HOME EXERCISE PROGRAM: Access Code: [email or web address] URL: [email or web address] Date: 03/18/2024 Prepared by: Lamar Ivory  Exercises - Single Knee to Chest Stretch  - 2-3 x daily -  7 x weekly - 1 sets - 5 reps - 20 seconds hold - Supine Figure 4 Piriformis Stretch  - 2-3 x daily - 7 x weekly - 1 sets - 5 reps - 20 seconds hold - Standing Lumbar Extension at Wall - Forearms  - 5 x daily - 7 x weekly - 1 sets - 5 reps - 3 seconds hold  ASSESSMENT:  CLINICAL IMPRESSION: Despite no specific pain increases noted in last visit intervention, Pt reported constant pain complaints in time since that day through today.  Symptoms mainly noted in Lt buttock into Lt posterior/lateral hip with some down the leg noted.  Palpation revealed noted trigger points in glute med/min with concordant symptoms.  Used graded manual intervention for trigger point release techniques to try to free up impacted area.  Fair tolerance within use of percussive device but not worse after.  Standing and walking after visit was better symptom reports compared to arrival.   OBJECTIVE IMPAIRMENTS: Abnormal gait, cardiopulmonary status limiting activity, decreased activity tolerance, decreased endurance, decreased knowledge of condition, difficulty walking, decreased ROM, decreased strength, decreased safety awareness, impaired perceived functional ability, increased muscle spasms, impaired flexibility, improper body mechanics, postural dysfunction, obesity, and pain.   ACTIVITY LIMITATIONS: carrying, lifting, bending, standing, sleeping, stairs, bed mobility, and locomotion level  PARTICIPATION LIMITATIONS: meal prep, cleaning, laundry, driving, shopping, community activity, and yard work  PERSONAL FACTORS: Anxiety, compressed vertebrae, hypothyroid, hypertension, pulmonary embolism, heart failure are also affecting patient's functional outcome.   REHAB POTENTIAL: Good  CLINICAL DECISION MAKING: Evolving/moderate complexity  EVALUATION COMPLEXITY: Moderate   GOALS: Goals reviewed with patient? Yes  SHORT TERM GOALS: Target date: 04/15/2024  Jenia will be independent with her day 1 home exercise  program Baseline: Started 03/18/2024 Goal status: on going 03/21/2024  2.  Improve lumbar extension AROM  to neutral Baseline: -20 degrees Goal status: on going 03/21/2024  3.  Improve bilateral lower extremity flexibility for hip flexors to 90 degrees; hamstrings to 50 degrees and bilateral hip external rotation to 25 degrees Baseline: 80/80; 40/45 and 14/17 respectively Goal status: on going 03/21/2024   LONG TERM GOALS: Target date: 05/13/2024  Improve patient-specific functional scale to at least 4 Baseline: 1 Goal status: INITIAL  2.  Quinetta will report low back and left hip pain consistently 0-3/10 on the numeric pain rating scale Baseline: 6/10 with left radicular symptoms as distal as the foot Goal status: INITIAL  3.  Jamea will be able to stand and walk for at least 10 minutes without being limited by low back, hip radicular symptoms Baseline: Unable at evaluation Goal status: INITIAL  4.  Jenaye will not require an assistive device for ambulation Baseline: Wheeled walker at evaluation Goal status: INITIAL  5.  Ophelia will be independent with her long-term home exercise program at discharge Baseline: Started 03/18/2024 Goal status: INITIAL  PLAN:  PT FREQUENCY: 2x/week  PT DURATION: 8 weeks  PLANNED INTERVENTIONS: 97750- Physical Performance Testing, 97110-Therapeutic exercises, 97530- Therapeutic activity, [id number]- Neuromuscular re-education, 97535- Self Care, [id number]- Manual therapy, [id number]- Gait training, [phone]- Electrical stimulation (unattended), [id number]- Traction (mechanical), 20560 (1-2 muscles), 20561 (3+ muscles)- Dry Needling, Patient/Family education, Spinal mobilization, Cryotherapy, and Moist heat.  PLAN FOR NEXT SESSION: How was trigger point release.    Ozell Silvan, PT, DPT, OCS, ATC 03/30/24  11:45 AM

## 2024-04-01 ENCOUNTER — Ambulatory Visit: Admitting: Rehabilitative and Restorative Service Providers"

## 2024-04-01 ENCOUNTER — Encounter: Payer: Self-pay | Admitting: Rehabilitative and Restorative Service Providers"

## 2024-04-01 DIAGNOSIS — M6281 Muscle weakness (generalized): Secondary | ICD-10-CM | POA: Diagnosis not present

## 2024-04-01 DIAGNOSIS — M5459 Other low back pain: Secondary | ICD-10-CM

## 2024-04-01 DIAGNOSIS — M5416 Radiculopathy, lumbar region: Secondary | ICD-10-CM

## 2024-04-01 DIAGNOSIS — R293 Abnormal posture: Secondary | ICD-10-CM | POA: Diagnosis not present

## 2024-04-01 DIAGNOSIS — R262 Difficulty in walking, not elsewhere classified: Secondary | ICD-10-CM

## 2024-04-01 NOTE — Therapy (Signed)
 OUTPATIENT PHYSICAL THERAPY TREATMENT   Patient Name: [name] MRN: [id number] DOB:27-Oct-1940, 83 y.o., female Today's Date: 04/01/2024  END OF SESSION:  PT End of Session - 04/01/24 0928     Visit Number 4    Number of Visits 16    Date for Recertification  05/13/24    Authorization Type [employer]    Progress Note Due on Visit 10    PT Start Time 0923    PT Stop Time 1003    PT Time Calculation (min) 40 min    Activity Tolerance Patient limited by fatigue;Patient limited by pain    Behavior During Therapy [facility] for tasks assessed/performed             Past Medical History:  Diagnosis Date   Anxiety    takes Lorazepam  daily as needed   Arthritis    Cancer (HCC)    thyroid    Cataracts, bilateral    immature   Chronic back pain    compressed vertebrae,takes Fosamax weekly   Family history of adverse reaction to anesthesia    granddaughter gets very sick and mean   History of blood transfusion    no abnormal reaction noted   History of bronchitis 05/2015   History of colon polyps    benign   Hyperlipidemia    takes Zetia  daily   Hypertension    takes Diltiazem  and Avapro  daily   Hypothyroidism    takes Synthroid  daily   Ischemic colitis    hx of-many yrs ago   Joint pain    Joint swelling    OSA on CPAP    05-08-12 AHi was 46, RDI  53. titrated to   9 cm water  with 3 cm flex.-nadir 75%   PE (pulmonary embolism)    takes Coumadin  daily   Peripheral edema    Pneumonia 1992   hx of   Sarcoidosis    Shortness of breath    Urinary frequency    Weakness    left leg.Numbness and tingling.Has sciatica   Wheeze    occaionally. Not new   Past Surgical History:  Procedure Laterality Date   ABDOMINAL HYSTERECTOMY     APPENDECTOMY     BREAST BIOPSY     biopsies on right x 2   BREAST CYST ASPIRATION Left    CHOLECYSTECTOMY N/A 11/29/2015   Procedure: LAPAROSCOPIC CHOLECYSTECTOMY;  Surgeon: Jina Nephew, MD;  Location: [facility] OR;  Service:  General;  Laterality: N/A;   COLONOSCOPY     DILATION AND CURETTAGE OF UTERUS     LIVER BIOPSY     MELANOMA EXCISION     removed from left leg   scalene surgery  1972   trying to find out what was wrong with her lungs   THYROID  SURGERY     Patient Active Problem List   Diagnosis Date Noted   Heart failure with preserved ejection fraction (HCC) 09/26/2022   Permanent atrial fibrillation (HCC) 09/26/2022   Warfarin anticoagulation 09/18/2022   Sepsis (HCC) 09/18/2022   Sepsis due to cellulitis (HCC) 09/18/2022   Cellulitis of right leg 09/18/2022   Chronic intermittent hypoxia with obstructive sleep apnea 09/12/2021   Severe obstructive sleep apnea-hypopnea syndrome 09/12/2021   Acute medial meniscus tear of right knee 03/31/2018   Acute pain of right knee 01/12/2018   Hypercoagulation syndrome 05/24/2014   Hunter's glossitis 05/24/2014   Sarcoidosis 05/24/2014   Hypoxemia 05/24/2014   Primary hypertension 10/13/2013   Nocturnal hypoxia 05/19/2013  Obstructive sleep apnea (adult) (pediatric) 05/19/2013   Obesity 05/19/2013   OSA on CPAP    Adenopathy 05/26/2012   Cough 05/20/2012   OSA (obstructive sleep apnea) 05/20/2012   Pulmonary embolism (HCC) 04/29/2012   DVT (deep venous thrombosis) (HCC) 04/29/2012    PCP: Toribio MATSU. Yolande, MD  REFERRING PROVIDER: Duwaine BRAVO. Trudy, NP  REFERRING DIAG: M54.42,M54.41,G89.29 (ICD-10-CM) - Chronic bilateral low back pain with bilateral sciatica M54.16 (ICD-10-CM) - Lumbar radiculopathy R26.9 (ICD-10-CM) - Gait abnormality  Rationale for Evaluation and Treatment: Rehabilitation  THERAPY DIAG:  Abnormal posture  Difficulty in walking, not elsewhere classified  Muscle weakness (generalized)  Radiculopathy, lumbar region  Other low back pain  ONSET DATE: December 03, 2023  SUBJECTIVE:                                                                                                                                                                                            SUBJECTIVE STATEMENT: Pt indicated having continued complaints.  Reported difficulty with sitting and standing.  Reported tennis ball work last night at home seemed to lower symptoms from 10 to 5-6 /10.  Reported bed mobility is painful.   PERTINENT HISTORY:  Anxiety, compressed vertebrae, hypothyroid, hypertension, pulmonary embolism, heart failure  PAIN:  NPRS scale: 10/10 Pain location: Low back, left hip and leg Pain description: Ache, sore Aggravating factors: Weight-bearing is worse than sitting or lying supine Relieving factors: Change of position  PRECAUTIONS: Back  RED FLAGS: None   WEIGHT BEARING RESTRICTIONS: No  FALLS:  Has patient fallen in last 6 months? No  LIVING ENVIRONMENT: Lives with: lives with their family and lives with their spouse Lives in: House/apartment Stairs: Uses handrail Has following equipment at home: Environmental consultant - 2 wheeled  OCCUPATION: Retired  PLOF: Independent  PATIENT GOALS: Decrease the hip and leg pain.    NEXT MD VISIT: NA  OBJECTIVE:  Note: Objective measures were completed at Evaluation unless otherwise noted.  DIAGNOSTIC FINDINGS:  Moderate multilevel degenerative changes  X-rays of the pelvis show degenerative spurring of the lateral  trochanteric region of the left proximal femur.    PATIENT SURVEYS:  PSFS: THE PATIENT SPECIFIC FUNCTIONAL SCALE  Place score of 0-10 (0 = unable to perform activity and 10 = able to perform activity at the same level as before injury or problem)  Activity Date: 03/18/2024    Vacuuming 1    2.  Cooking 1    3.  Driving 1    4.  Yard work 1    Total Score 1      Total Score = Sum of activity scores/number  of activities  Minimally Detectable Change: 3 points (for single activity); 2 points (for average score)  [facility] (nd). The Patient Specific Functional Scale . Retrieved from  [email or web address]   COGNITION: 03/18/2024 Overall cognitive status: Within functional limits for tasks assessed     SENSATION: 03/18/2024 [name] notes left sided peripheral symptoms, mostly of the hip/gluteal area and distal from the knee to the foot  MUSCLE LENGTH: 03/18/2024 Hamstrings: Right 45 deg; Left 40 deg  POSTURE:  03/18/2024 rounded shoulders, forward head, decreased lumbar lordosis, and flexed trunk   LUMBAR ROM:   AROM Eval 03/18/2024 03/21/2024 03/30/2024  Flexion     Extension -20 25 % without pain complaints.  25% WFL without pain  Repeated x 5 improved to 50%  Right lateral flexion     Left lateral flexion     Right rotation     Left rotation      (Blank rows = not tested)  LOWER EXTREMITY ROM:      Right 03/18/2024 Passive Left 03/18/2024 Passive Right 03/21/2024 Left 03/21/2024  Hip flexion 80 80 PROM in supine 90 PROM in supine 105  Hip extension      Hip abduction      Hip adduction      Hip internal rotation 18 18    Hip external rotation 17 14    Knee flexion      Knee extension      Ankle dorsiflexion      Ankle plantarflexion      Ankle inversion      Ankle eversion       (Blank rows = not tested)  LOWER EXTREMITY MMT:  03/18/2024 Deferred secondary to discomfort in test positions  MMT    Hip flexion    Hip extension    Hip abduction    Hip adduction    Hip internal rotation    Hip external rotation    Knee flexion    Knee extension    Ankle dorsiflexion    Ankle plantarflexion    Ankle inversion    Ankle eversion     (Blank rows = not tested)  GAIT: 03/18/2024 Distance walked: 100 feet Assistive device utilized: Environmental consultant - 2 wheeled Level of assistance: Modified independence Comments: Alvetta notes that she is limited to standing and walking for short periods of time, only a few minutes                    TREATMENT          DATE: 04/01/2024 Therex: Nustep  lvl 5 UE/LE for Rom 10 mins  Sidelying clam shell Lt 3 x 10 Supine lumbar trunk rotation stretch 15 sec x 3 bilateral Supine SKC with strap assist 20 sec x 3 bilaterally  Supine one leg outstretched hip extension isometric into table 5 sec hold 3 x 10 Lt, x 10 Rt.  Lumbar extension AROM review.    Manual Compression to Lt glute med/min trigger points, Percussive device to same.  Detailed tennis ball use at home as well.    TREATMENT          DATE: 03/30/2024 Therex: Nustep lvl 5 UE/LE for ROM.  10 mins Sidelying Lt hip clam shell 2 x 10  Supine hooklying lumbar trunk rotation stretch 15 sec x 3 bilateral Supine hooklying SKC 20 sec x 3 bilateral with strap assist  Standing lumbar extension AROM x 5   TherActivity Supine to sit to supine transfer education and cues  verbally, tactile to promote log rolling techniques. Pt may continue to benefit from continued training in movements.   Manual Compression to Lt glute med/min trigger points, Percussive device to same.  Detailed tennis ball use at home as well.      TREATMENT          DATE: 03/21/2024 Therex: Standing lumbar extension AROM x 5  Supine hooklying trunk rotation stretch 20 sec x 3 bilateral Supine SKC 20 sec x 3 bilaterally    Review of HEP.   TherActivity Cues verbally and visually for log rolling techniques in supine to sit to supine bed mobility.  Detailed procedures to improve independence and reduce lumbar strain in mobility.   Supine bridge x 15 1-2 second hold (to improve bed mobility)  Sit to stand to sit x 5 without UE  - 19.5 inch table height.    TREATMENT          DATE:03/18/2024 Single knee-to-chest stretch with opposite leg straight 4 x 20 seconds Figure 4 stretch 4 x 20 seconds Standing lumbar extension AROM 10 x 3 seconds  [id number]: Reviewed imaging; imaging reports; spine anatomy basics; today's examination findings and her day 1 home exercise program                                                                                                                                PATIENT EDUCATION:  03/18/2024 Education details: See above Person educated: Patient and Spouse Education method: Explanation, Demonstration, Tactile cues, Verbal cues, and Handouts Education comprehension: verbalized understanding, returned demonstration, verbal cues required, tactile cues required, and needs further education  HOME EXERCISE PROGRAM: Access Code: [email or web address] URL: [email or web address] Date: 04/01/2024 Prepared by: Ozell Silvan  Exercises - Single Knee to Chest Stretch  - 2-3 x daily - 7 x weekly - 1 sets - 5 reps - 20 seconds hold - Supine Figure 4 Piriformis Stretch  - 2-3 x daily - 7 x weekly - 1 sets - 5 reps - 20 seconds hold - Clamshell  - 1-2 x daily - 7 x weekly - 2-3 sets - 10-15 reps - Supine Lower Trunk Rotation  - 2-3 x daily - 7 x weekly - 1 sets - 3-5 reps - 15 hold - Standing Lumbar Extension at Wall - Forearms  - 2-4 x daily - 7 x weekly - 1 sets - 5-10 reps - Standing Lumbar Extension  - 2-4 x daily - 7 x weekly - 1 sets - 5-10 reps  ASSESSMENT:  CLINICAL IMPRESSION: Continued higher elevation symptoms indicated upon arrival to clinic.   Tenderness in lateral hip/thigh still present.  Indicated of some short term improvement with manual manipulation of trigger points .  Adjusted HEP to include additional stretching for symptom management at home.  Continued encouragement of use tennis ball trigger point release as able.  Limited in functional progressions for strength, balance due to pain symptoms.   OBJECTIVE IMPAIRMENTS: Abnormal gait,  cardiopulmonary status limiting activity, decreased activity tolerance, decreased endurance, decreased knowledge of condition, difficulty walking, decreased ROM, decreased strength, decreased safety awareness, impaired perceived functional ability, increased muscle spasms, impaired flexibility, improper body mechanics, postural  dysfunction, obesity, and pain.   ACTIVITY LIMITATIONS: carrying, lifting, bending, standing, sleeping, stairs, bed mobility, and locomotion level  PARTICIPATION LIMITATIONS: meal prep, cleaning, laundry, driving, shopping, community activity, and yard work  PERSONAL FACTORS: Anxiety, compressed vertebrae, hypothyroid, hypertension, pulmonary embolism, heart failure are also affecting patient's functional outcome.   REHAB POTENTIAL: Good  CLINICAL DECISION MAKING: Evolving/moderate complexity  EVALUATION COMPLEXITY: Moderate   GOALS: Goals reviewed with patient? Yes  SHORT TERM GOALS: Target date: 04/15/2024  Navi will be independent with her day 1 home exercise program Baseline: Started 03/18/2024 Goal status: on going 03/21/2024  2.  Improve lumbar extension AROM to neutral Baseline: -20 degrees Goal status: on going 03/21/2024  3.  Improve bilateral lower extremity flexibility for hip flexors to 90 degrees; hamstrings to 50 degrees and bilateral hip external rotation to 25 degrees Baseline: 80/80; 40/45 and 14/17 respectively Goal status: on going 03/21/2024   LONG TERM GOALS: Target date: 05/13/2024  Improve patient-specific functional scale to at least 4 Baseline: 1 Goal status: INITIAL  2.  Aira will report low back and left hip pain consistently 0-3/10 on the numeric pain rating scale Baseline: 6/10 with left radicular symptoms as distal as the foot Goal status: INITIAL  3.  Chaitra will be able to stand and walk for at least 10 minutes without being limited by low back, hip radicular symptoms Baseline: Unable at evaluation Goal status: INITIAL  4.  Isamar will not require an assistive device for ambulation Baseline: Wheeled walker at evaluation Goal status: INITIAL  5.  Leyli will be independent with her long-term home exercise program at discharge Baseline: Started 03/18/2024 Goal status: INITIAL  PLAN:  PT FREQUENCY: 2x/week  PT DURATION: 8  weeks  PLANNED INTERVENTIONS: 97750- Physical Performance Testing, 97110-Therapeutic exercises, 97530- Therapeutic activity, [id number]- Neuromuscular re-education, 97535- Self Care, [id number]- Manual therapy, [id number]- Gait training, [phone]- Electrical stimulation (unattended), [id number]- Traction (mechanical), 20560 (1-2 muscles), 20561 (3+ muscles)- Dry Needling, Patient/Family education, Spinal mobilization, Cryotherapy, and Moist heat.  PLAN FOR NEXT SESSION: Continue to address pain symptoms manually and with interventions.  Progress strength and balance as able.    Ozell Silvan, PT, DPT, OCS, ATC 04/01/24  10:06 AM

## 2024-04-04 DIAGNOSIS — G4733 Obstructive sleep apnea (adult) (pediatric): Secondary | ICD-10-CM | POA: Diagnosis not present

## 2024-04-05 ENCOUNTER — Encounter: Payer: Self-pay | Admitting: Rehabilitative and Restorative Service Providers"

## 2024-04-05 ENCOUNTER — Ambulatory Visit: Admitting: Rehabilitative and Restorative Service Providers"

## 2024-04-05 DIAGNOSIS — M5459 Other low back pain: Secondary | ICD-10-CM

## 2024-04-05 DIAGNOSIS — R293 Abnormal posture: Secondary | ICD-10-CM | POA: Diagnosis not present

## 2024-04-05 DIAGNOSIS — R262 Difficulty in walking, not elsewhere classified: Secondary | ICD-10-CM | POA: Diagnosis not present

## 2024-04-05 DIAGNOSIS — M6281 Muscle weakness (generalized): Secondary | ICD-10-CM

## 2024-04-05 DIAGNOSIS — M5416 Radiculopathy, lumbar region: Secondary | ICD-10-CM | POA: Diagnosis not present

## 2024-04-05 NOTE — Therapy (Signed)
 OUTPATIENT PHYSICAL THERAPY TREATMENT   Patient Name: [name] MRN: [id number] DOB:[birth year], 83 y.o., female Today's Date: 04/05/2024  END OF SESSION:  PT End of Session - 04/05/24 0907     Visit Number 5    Number of Visits 16    Date for Recertification  05/13/24    Authorization Type [employer]    Progress Note Due on Visit 10    PT Start Time 0923    PT Stop Time 1003    PT Time Calculation (min) 40 min    Activity Tolerance Patient limited by fatigue;Patient limited by pain    Behavior During Therapy [facility] for tasks assessed/performed              Past Medical History:  Diagnosis Date   Anxiety    takes Lorazepam  daily as needed   Arthritis    Cancer (HCC)    thyroid    Cataracts, bilateral    immature   Chronic back pain    compressed vertebrae,takes Fosamax weekly   Family history of adverse reaction to anesthesia    granddaughter gets very sick and mean   History of blood transfusion    no abnormal reaction noted   History of bronchitis 05/2015   History of colon polyps    benign   Hyperlipidemia    takes Zetia  daily   Hypertension    takes Diltiazem  and Avapro  daily   Hypothyroidism    takes Synthroid  daily   Ischemic colitis    hx of-many yrs ago   Joint pain    Joint swelling    OSA on CPAP    05-08-12 AHi was 46, RDI  53. titrated to   9 cm water  with 3 cm flex.-nadir 75%   PE (pulmonary embolism)    takes Coumadin  daily   Peripheral edema    Pneumonia 1992   hx of   Sarcoidosis    Shortness of breath    Urinary frequency    Weakness    left leg.Numbness and tingling.Has sciatica   Wheeze    occaionally. Not new   Past Surgical History:  Procedure Laterality Date   ABDOMINAL HYSTERECTOMY     APPENDECTOMY     BREAST BIOPSY     biopsies on right x 2   BREAST CYST ASPIRATION Left    CHOLECYSTECTOMY N/A 11/29/2015   Procedure: LAPAROSCOPIC CHOLECYSTECTOMY;  Surgeon: Jina Nephew, MD;  Location: [facility] OR;  Service:  General;  Laterality: N/A;   COLONOSCOPY     DILATION AND CURETTAGE OF UTERUS     LIVER BIOPSY     MELANOMA EXCISION     removed from left leg   scalene surgery  1972   trying to find out what was wrong with her lungs   THYROID  SURGERY     Patient Active Problem List   Diagnosis Date Noted   Heart failure with preserved ejection fraction (HCC) 09/26/2022   Permanent atrial fibrillation (HCC) 09/26/2022   Warfarin anticoagulation 09/18/2022   Sepsis (HCC) 09/18/2022   Sepsis due to cellulitis (HCC) 09/18/2022   Cellulitis of right leg 09/18/2022   Chronic intermittent hypoxia with obstructive sleep apnea 09/12/2021   Severe obstructive sleep apnea-hypopnea syndrome 09/12/2021   Acute medial meniscus tear of right knee 03/31/2018   Acute pain of right knee 01/12/2018   Hypercoagulation syndrome 05/24/2014   Hunter's glossitis 05/24/2014   Sarcoidosis 05/24/2014   Hypoxemia 05/24/2014   Primary hypertension 10/13/2013   Nocturnal hypoxia 05/19/2013  Obstructive sleep apnea (adult) (pediatric) 05/19/2013   Obesity 05/19/2013   OSA on CPAP    Adenopathy 05/26/2012   Cough 05/20/2012   OSA (obstructive sleep apnea) 05/20/2012   Pulmonary embolism (HCC) 04/29/2012   DVT (deep venous thrombosis) (HCC) 04/29/2012    PCP: Toribio MATSU. Yolande, MD  REFERRING PROVIDER: Duwaine BRAVO. Trudy, NP  REFERRING DIAG: M54.42,M54.41,G89.29 (ICD-10-CM) - Chronic bilateral low back pain with bilateral sciatica M54.16 (ICD-10-CM) - Lumbar radiculopathy R26.9 (ICD-10-CM) - Gait abnormality  Rationale for Evaluation and Treatment: Rehabilitation  THERAPY DIAG:  Abnormal posture  Difficulty in walking, not elsewhere classified  Muscle weakness (generalized)  Radiculopathy, lumbar region  Other low back pain  ONSET DATE: December 03, 2023  SUBJECTIVE:                                                                                                                                                                                            SUBJECTIVE STATEMENT: Pt indicated continued complaints 10/10.  Reported having a few hours of improvement after last visit with reduction to 5/10.  Pt indicated some improvement in bed mobility symptoms.  Pt indicated walking/standing continued to be most painful.  Pt symptoms most in back and hip Lt  PERTINENT HISTORY:  Anxiety, compressed vertebrae, hypothyroid, hypertension, pulmonary embolism, heart failure  PAIN:  NPRS scale: 10/10 upon arrival.  Pain location: Low back, left hip and leg Pain description: Ache, sore Aggravating factors: standing, walking, sitting.  Relieving factors: Change of position  PRECAUTIONS: Back  RED FLAGS: None   WEIGHT BEARING RESTRICTIONS: No  FALLS:  Has patient fallen in last 6 months? No  LIVING ENVIRONMENT: Lives with: lives with their family and lives with their spouse Lives in: House/apartment Stairs: Uses handrail Has following equipment at home: Environmental Consultant - 2 wheeled  OCCUPATION: Retired  PLOF: Independent  PATIENT GOALS: Decrease the hip and leg pain.    NEXT MD VISIT: NA  OBJECTIVE:  Note: Objective measures were completed at Evaluation unless otherwise noted.  DIAGNOSTIC FINDINGS:  Moderate multilevel degenerative changes  X-rays of the pelvis show degenerative spurring of the lateral  trochanteric region of the left proximal femur.    PATIENT SURVEYS:  PSFS: THE PATIENT SPECIFIC FUNCTIONAL SCALE  Place score of 0-10 (0 = unable to perform activity and 10 = able to perform activity at the same level as before injury or problem)  Activity Date: 03/18/2024    Vacuuming 1    2.  Cooking 1    3.  Driving 1    4.  Yard work 1    Total Score 1  Total Score = Sum of activity scores/number of activities  Minimally Detectable Change: 3 points (for single activity); 2 points (for average score)  [facility] (nd). The Patient Specific Functional Scale .  Retrieved from [email or web address]   COGNITION: 03/18/2024 Overall cognitive status: Within functional limits for tasks assessed     SENSATION: 03/18/2024 [name] notes left sided peripheral symptoms, mostly of the hip/gluteal area and distal from the knee to the foot  MUSCLE LENGTH: 03/18/2024 Hamstrings: Right 45 deg; Left 40 deg  POSTURE:  03/18/2024 rounded shoulders, forward head, decreased lumbar lordosis, and flexed trunk   LUMBAR ROM:   AROM Eval 03/18/2024 03/21/2024 03/30/2024 04/05/2024  Flexion      Extension -20 25 % without pain complaints.  25% WFL without pain  Repeated x 5 improved to 50% 50% WFL  Right lateral flexion      Left lateral flexion      Right rotation      Left rotation       (Blank rows = not tested)  LOWER EXTREMITY ROM:      Right 03/18/2024 Passive Left 03/18/2024 Passive Right 03/21/2024 Left 03/21/2024  Hip flexion 80 80 PROM in supine 90 PROM in supine 105  Hip extension      Hip abduction      Hip adduction      Hip internal rotation 18 18    Hip external rotation 17 14    Knee flexion      Knee extension      Ankle dorsiflexion      Ankle plantarflexion      Ankle inversion      Ankle eversion       (Blank rows = not tested)  LOWER EXTREMITY MMT:  03/18/2024 Deferred secondary to discomfort in test positions  MMT    Hip flexion    Hip extension    Hip abduction    Hip adduction    Hip internal rotation    Hip external rotation    Knee flexion    Knee extension    Ankle dorsiflexion    Ankle plantarflexion    Ankle inversion    Ankle eversion     (Blank rows = not tested)  GAIT: 03/18/2024 Distance walked: 100 feet Assistive device utilized: Environmental Consultant - 2 wheeled Level of assistance: Modified independence Comments: [name] notes that she is limited to standing and walking for short periods of time, only a few minutes                    TREATMENT           DATE: 04/05/2024 Therex: Nustep lvl 6 UE/LE for Rom 10 mins  Sidelying clam shell Lt hip 2 x 12  Standing hip hike in walker 2 sec hold x 5 bilateral (limited by pain, stopped) Supine hooklying bridge 2 x 10  Supine hooklying trunk rotation 15 sec x 3 bilateral   Neuro  (postural awareness, strengthening) Standing lumbar extension AROM 2 x 5 Seated red band rows c scapular retraction focus 2 x 10  Manual Compression to Lt glute med/min trigger points, Percussive device to same.      TREATMENT          DATE: 04/01/2024 Therex: Nustep lvl 5 UE/LE for Rom 10 mins  Sidelying clam shell Lt 3 x 10 Supine lumbar trunk rotation stretch 15 sec x 3 bilateral Supine SKC with strap assist 20 sec x 3 bilaterally  Supine one leg  outstretched hip extension isometric into table 5 sec hold 3 x 10 Lt, x 10 Rt.  Lumbar extension AROM review.    Manual Compression to Lt glute med/min trigger points, Percussive device to same.    TREATMENT          DATE: 03/30/2024 Therex: Nustep lvl 5 UE/LE for ROM.  10 mins Sidelying Lt hip clam shell 2 x 10  Supine hooklying lumbar trunk rotation stretch 15 sec x 3 bilateral Supine hooklying SKC 20 sec x 3 bilateral with strap assist  Standing lumbar extension AROM x 5   TherActivity Supine to sit to supine transfer education and cues verbally, tactile to promote log rolling techniques. Pt may continue to benefit from continued training in movements.   Manual Compression to Lt glute med/min trigger points, Percussive device to same.  Detailed tennis ball use at home as well.      TREATMENT          DATE: 03/21/2024 Therex: Standing lumbar extension AROM x 5  Supine hooklying trunk rotation stretch 20 sec x 3 bilateral Supine SKC 20 sec x 3 bilaterally    Review of HEP.   TherActivity Cues verbally and visually for log rolling techniques in supine to sit to supine bed mobility.  Detailed procedures to improve independence and reduce  lumbar strain in mobility.   Supine bridge x 15 1-2 second hold (to improve bed mobility)  Sit to stand to sit x 5 without UE  - 19.5 inch table height.                                                                                                                              PATIENT EDUCATION:  03/18/2024 Education details: See above Person educated: Patient and Spouse Education method: Explanation, Demonstration, Tactile cues, Verbal cues, and Handouts Education comprehension: verbalized understanding, returned demonstration, verbal cues required, tactile cues required, and needs further education  HOME EXERCISE PROGRAM: Access Code: [email or web address] URL: [email or web address] Date: 04/01/2024 Prepared by: Ozell Silvan  Exercises - Single Knee to Chest Stretch  - 2-3 x daily - 7 x weekly - 1 sets - 5 reps - 20 seconds hold - Supine Figure 4 Piriformis Stretch  - 2-3 x daily - 7 x weekly - 1 sets - 5 reps - 20 seconds hold - Clamshell  - 1-2 x daily - 7 x weekly - 2-3 sets - 10-15 reps - Supine Lower Trunk Rotation  - 2-3 x daily - 7 x weekly - 1 sets - 3-5 reps - 15 hold - Standing Lumbar Extension at Wall - Forearms  - 2-4 x daily - 7 x weekly - 1 sets - 5-10 reps - Standing Lumbar Extension  - 2-4 x daily - 7 x weekly - 1 sets - 5-10 reps  ASSESSMENT:  CLINICAL IMPRESSION: Some indications of improvement in bed mobility tolerance and performance.  Continued complaints of high severity/irritability of symptoms in back/Lt hip that  seems constant without worsening by use of HEP.  Short term reliefs noted in clinic visit and some use of HEP at home.  Mild improvements in mobility noted for lumbar region.   OBJECTIVE IMPAIRMENTS: Abnormal gait, cardiopulmonary status limiting activity, decreased activity tolerance, decreased endurance, decreased knowledge of condition, difficulty walking, decreased ROM, decreased strength, decreased safety awareness, impaired perceived  functional ability, increased muscle spasms, impaired flexibility, improper body mechanics, postural dysfunction, obesity, and pain.   ACTIVITY LIMITATIONS: carrying, lifting, bending, standing, sleeping, stairs, bed mobility, and locomotion level  PARTICIPATION LIMITATIONS: meal prep, cleaning, laundry, driving, shopping, community activity, and yard work  PERSONAL FACTORS: Anxiety, compressed vertebrae, hypothyroid, hypertension, pulmonary embolism, heart failure are also affecting patient's functional outcome.   REHAB POTENTIAL: Good  CLINICAL DECISION MAKING: Evolving/moderate complexity  EVALUATION COMPLEXITY: Moderate   GOALS: Goals reviewed with patient? Yes  SHORT TERM GOALS: Target date: 04/15/2024  [name] will be independent with her day 1 home exercise program Baseline: Started 03/18/2024 Goal status: on going 03/21/2024  2.  Improve lumbar extension AROM to neutral Baseline: -20 degrees Goal status: Met 04/05/2024  3.  Improve bilateral lower extremity flexibility for hip flexors to 90 degrees; hamstrings to 50 degrees and bilateral hip external rotation to 25 degrees Baseline: 80/80; 40/45 and 14/17 respectively Goal status: on going 03/21/2024   LONG TERM GOALS: Target date: 05/13/2024  Improve patient-specific functional scale to at least 4 Baseline: 1 Goal status: INITIAL  2.  [name] will report low back and left hip pain consistently 0-3/10 on the numeric pain rating scale Baseline: 6/10 with left radicular symptoms as distal as the foot Goal status: INITIAL  3.  [name] will be able to stand and walk for at least 10 minutes without being limited by low back, hip radicular symptoms Baseline: Unable at evaluation Goal status: INITIAL  4.  [name] will not require an assistive device for ambulation Baseline: Wheeled walker at evaluation Goal status: INITIAL  5.  [name] will be independent with her long-term home exercise program at discharge Baseline:  Started 03/18/2024 Goal status: INITIAL  PLAN:  PT FREQUENCY: 2x/week  PT DURATION: 8 weeks  PLANNED INTERVENTIONS: 97750- Physical Performance Testing, 97110-Therapeutic exercises, 97530- Therapeutic activity, [id number]- Neuromuscular re-education, 97535- Self Care, [id number]- Manual therapy, [id number]- Gait training, [id number]- Electrical stimulation (unattended), [id number]- Traction (mechanical), 20560 (1-2 muscles), 20561 (3+ muscles)- Dry Needling, Patient/Family education, Spinal mobilization, Cryotherapy, and Moist heat.  PLAN FOR NEXT SESSION: May suggest follow up with referring provider if symptoms continue to stay elevated.    Ozell Silvan, PT, DPT, OCS, ATC 04/05/24  10:04 AM

## 2024-04-07 ENCOUNTER — Ambulatory Visit: Admitting: Rehabilitative and Restorative Service Providers"

## 2024-04-07 ENCOUNTER — Encounter: Payer: Self-pay | Admitting: Rehabilitative and Restorative Service Providers"

## 2024-04-07 DIAGNOSIS — R262 Difficulty in walking, not elsewhere classified: Secondary | ICD-10-CM | POA: Diagnosis not present

## 2024-04-07 DIAGNOSIS — M5416 Radiculopathy, lumbar region: Secondary | ICD-10-CM | POA: Diagnosis not present

## 2024-04-07 DIAGNOSIS — M6281 Muscle weakness (generalized): Secondary | ICD-10-CM | POA: Diagnosis not present

## 2024-04-07 DIAGNOSIS — M5459 Other low back pain: Secondary | ICD-10-CM

## 2024-04-07 DIAGNOSIS — R293 Abnormal posture: Secondary | ICD-10-CM | POA: Diagnosis not present

## 2024-04-07 NOTE — Therapy (Signed)
 OUTPATIENT PHYSICAL THERAPY TREATMENT   Patient Name: [name] MRN: [id number] DOB:08-16-1940, 83 y.o., female Today's Date: 04/07/2024  END OF SESSION:  PT End of Session - 04/07/24 1012     Visit Number 6    Number of Visits 16    Date for Recertification  05/13/24    Authorization Type [employer]    Progress Note Due on Visit 10    PT Start Time 1012    PT Stop Time 1101    PT Time Calculation (min) 49 min    Activity Tolerance Patient tolerated treatment well    Behavior During Therapy WFL for tasks assessed/performed               Past Medical History:  Diagnosis Date   Anxiety    takes Lorazepam  daily as needed   Arthritis    Cancer (HCC)    thyroid    Cataracts, bilateral    immature   Chronic back pain    compressed vertebrae,takes Fosamax weekly   Family history of adverse reaction to anesthesia    granddaughter gets very sick and mean   History of blood transfusion    no abnormal reaction noted   History of bronchitis 05/2015   History of colon polyps    benign   Hyperlipidemia    takes Zetia  daily   Hypertension    takes Diltiazem  and Avapro  daily   Hypothyroidism    takes Synthroid  daily   Ischemic colitis    hx of-many yrs ago   Joint pain    Joint swelling    OSA on CPAP    05-08-12 AHi was 46, RDI  53. titrated to   9 cm water  with 3 cm flex.-nadir 75%   PE (pulmonary embolism)    takes Coumadin  daily   Peripheral edema    Pneumonia 1992   hx of   Sarcoidosis    Shortness of breath    Urinary frequency    Weakness    left leg.Numbness and tingling.Has sciatica   Wheeze    occaionally. Not new   Past Surgical History:  Procedure Laterality Date   ABDOMINAL HYSTERECTOMY     APPENDECTOMY     BREAST BIOPSY     biopsies on right x 2   BREAST CYST ASPIRATION Left    CHOLECYSTECTOMY N/A 11/29/2015   Procedure: LAPAROSCOPIC CHOLECYSTECTOMY;  Surgeon: Jina Nephew, MD;  Location: [facility] OR;  Service: General;   Laterality: N/A;   COLONOSCOPY     DILATION AND CURETTAGE OF UTERUS     LIVER BIOPSY     MELANOMA EXCISION     removed from left leg   scalene surgery  1972   trying to find out what was wrong with her lungs   THYROID  SURGERY     Patient Active Problem List   Diagnosis Date Noted   Heart failure with preserved ejection fraction (HCC) 09/26/2022   Permanent atrial fibrillation (HCC) 09/26/2022   Warfarin anticoagulation 09/18/2022   Sepsis (HCC) 09/18/2022   Sepsis due to cellulitis (HCC) 09/18/2022   Cellulitis of right leg 09/18/2022   Chronic intermittent hypoxia with obstructive sleep apnea 09/12/2021   Severe obstructive sleep apnea-hypopnea syndrome 09/12/2021   Acute medial meniscus tear of right knee 03/31/2018   Acute pain of right knee 01/12/2018   Hypercoagulation syndrome 05/24/2014   Hunter's glossitis 05/24/2014   Sarcoidosis 05/24/2014   Hypoxemia 05/24/2014   Primary hypertension 10/13/2013   Nocturnal hypoxia 05/19/2013  Obstructive sleep apnea (adult) (pediatric) 05/19/2013   Obesity 05/19/2013   OSA on CPAP    Adenopathy 05/26/2012   Cough 05/20/2012   OSA (obstructive sleep apnea) 05/20/2012   Pulmonary embolism (HCC) 04/29/2012   DVT (deep venous thrombosis) (HCC) 04/29/2012    PCP: Toribio MATSU. Yolande, MD  REFERRING PROVIDER: Duwaine BRAVO. Trudy, NP  REFERRING DIAG: M54.42,M54.41,G89.29 (ICD-10-CM) - Chronic bilateral low back pain with bilateral sciatica M54.16 (ICD-10-CM) - Lumbar radiculopathy R26.9 (ICD-10-CM) - Gait abnormality  Rationale for Evaluation and Treatment: Rehabilitation  THERAPY DIAG:  Abnormal posture  Difficulty in walking, not elsewhere classified  Muscle weakness (generalized)  Radiculopathy, lumbar region  Other low back pain  ONSET DATE: December 03, 2023  SUBJECTIVE:                                                                                                                                                                                            SUBJECTIVE STATEMENT: [name] reports good HEP compliance.  She reports her pain is not the same as it was at evaluation.  She can still get pain to the point that it interferes with ADLs.  PERTINENT HISTORY:  Anxiety, compressed vertebrae, hypothyroid, hypertension, pulmonary embolism, heart failure  PAIN:  NPRS scale: 5-10/10 this week Pain location: Low back, left gluteals and as distal as the left ankle Pain description: Leg is cramping, low back and gluteal penetrating Aggravating factors: Cold, wet weather, standing, walking, prolonged sitting.  Relieving factors: Change of position, exercises  PRECAUTIONS: Back  RED FLAGS: None   WEIGHT BEARING RESTRICTIONS: No  FALLS:  Has patient fallen in last 6 months? No  LIVING ENVIRONMENT: Lives with: lives with their family and lives with their spouse Lives in: House/apartment Stairs: Uses handrail Has following equipment at home: Environmental Consultant - 2 wheeled  OCCUPATION: Retired  PLOF: Independent  PATIENT GOALS: Decrease the hip and leg pain.    NEXT MD VISIT: NA  OBJECTIVE:  Note: Objective measures were completed at Evaluation unless otherwise noted.  DIAGNOSTIC FINDINGS:  Moderate multilevel degenerative changes  X-rays of the pelvis show degenerative spurring of the lateral  trochanteric region of the left proximal femur.    PATIENT SURVEYS:  PSFS: THE PATIENT SPECIFIC FUNCTIONAL SCALE  Place score of 0-10 (0 = unable to perform activity and 10 = able to perform activity at the same level as before injury or problem)  Activity Date: 03/18/2024    Vacuuming 1    2.  Cooking 1    3.  Driving 1    4.  Yard work 1    Total Score 1  Total Score = Sum of activity scores/number of activities  Minimally Detectable Change: 3 points (for single activity); 2 points (for average score)  [facility] (nd). The Patient Specific Functional Scale . Retrieved from  [email or web address]   COGNITION: 03/18/2024 Overall cognitive status: Within functional limits for tasks assessed     SENSATION: 03/18/2024 [name] notes left sided peripheral symptoms, mostly of the hip/gluteal area and distal from the knee to the foot  MUSCLE LENGTH: 04/07/2024  Hamstrings: Right 50 deg; Left 45 deg  03/18/2024 Hamstrings: Right 45 deg; Left 40 deg  POSTURE:  03/18/2024 rounded shoulders, forward head, decreased lumbar lordosis, and flexed trunk   LUMBAR ROM:   AROM Eval 03/18/2024 03/21/2024 03/30/2024 04/05/2024 04/07/2024  Flexion       Extension -20 25 % without pain complaints.  25% WFL without pain  Repeated x 5 improved to 50% 50% WFL 0  Right lateral flexion       Left lateral flexion       Right rotation       Left rotation        (Blank rows = not tested)  LOWER EXTREMITY ROM:      Right 03/18/2024 Passive Left 03/18/2024 Passive Right 03/21/2024 Left 03/21/2024 Left/Right 04/07/2024  Hip flexion 80 80 PROM in supine 90 PROM in supine 105 90/90  Hip extension       Hip abduction       Hip adduction       Hip internal rotation 18 18   1/9  Hip external rotation 17 14   32/32  Knee flexion       Knee extension       Ankle dorsiflexion       Ankle plantarflexion       Ankle inversion       Ankle eversion        (Blank rows = not tested)  LOWER EXTREMITY MMT:  03/18/2024 Deferred secondary to discomfort in test positions  MMT    Hip flexion    Hip extension    Hip abduction    Hip adduction    Hip internal rotation    Hip external rotation    Knee flexion    Knee extension    Ankle dorsiflexion    Ankle plantarflexion    Ankle inversion    Ankle eversion     (Blank rows = not tested)  GAIT: 03/18/2024 Distance walked: 100 feet Assistive device utilized: Environmental Consultant - 2 wheeled Level of assistance: Modified independence Comments: [name] notes that she is limited to  standing and walking for short periods of time, only a few minutes                    TREATMENT          DATE:  04/07/2024 Single knee-to-chest stretch with opposite leg straight 4 x 20 seconds Figure 4 stretch 4 x 20 seconds Standing lumbar extension AROM 10 x 3 seconds Yoga Bridge 2 sets of 10 for 5 seconds Side-lying clams with black Thera-Band resistance 2 sets of 10 for 3 seconds each side  02464: Reviewed objective measures; body mechanics including bed mobility and made updates to her home exercise program   04/05/2024 Therex: Nustep lvl 6 UE/LE for Rom 10 mins  Sidelying clam shell Lt hip 2 x 12  Standing hip hike in walker 2 sec hold x 5 bilateral (limited by pain, stopped) Supine hooklying bridge 2 x 10  Supine hooklying  trunk rotation 15 sec x 3 bilateral   Neuro  (postural awareness, strengthening) Standing lumbar extension AROM 2 x 5 Seated red band rows c scapular retraction focus 2 x 10  Manual Compression to Lt glute med/min trigger points, Percussive device to same.     04/01/2024 Therex: Nustep lvl 5 UE/LE for Rom 10 mins  Sidelying clam shell Lt 3 x 10 Supine lumbar trunk rotation stretch 15 sec x 3 bilateral Supine SKC with strap assist 20 sec x 3 bilaterally  Supine one leg outstretched hip extension isometric into table 5 sec hold 3 x 10 Lt, x 10 Rt.  Lumbar extension AROM review.    Manual Compression to Lt glute med/min trigger points, Percussive device to same.   TherActivity Cues verbally and visually for log rolling techniques in supine to sit to supine bed mobility.  Detailed procedures to improve independence and reduce lumbar strain in mobility.   Supine bridge x 15 1-2 second hold (to improve bed mobility)  Sit to stand to sit x 5 without UE  - 19.5 inch table height.                                                                                                                            PATIENT EDUCATION:  03/18/2024 Education  details: See above Person educated: Patient and Spouse Education method: Explanation, Demonstration, Tactile cues, Verbal cues, and Handouts Education comprehension: verbalized understanding, returned demonstration, verbal cues required, tactile cues required, and needs further education  HOME EXERCISE PROGRAM: Access Code: [email or web address] URL: [email or web address] Date: 04/07/2024 Prepared by: Lamar Ivory  Exercises - Single Knee to Chest Stretch  - 2 x daily - 7 x weekly - 1 sets - 5 reps - 20 seconds hold - Supine Figure 4 Piriformis Stretch  - 2 x daily - 7 x weekly - 1 sets - 5 reps - 20 seconds hold - Clamshell  - 2 x daily - 7 x weekly - 2 sets - 10 reps - 3 seconds hold - Supine Lower Trunk Rotation  - 2 x daily - 1 x weekly - 1 sets - 3-5 reps - 15 hold - Standing Lumbar Extension at Wall - Forearms  - 5 x daily - 7 x weekly - 1 sets - 5 reps - 3 seconds hold - Yoga Bridge  - 2 x daily - 7 x weekly - 2 sets - 10 reps - 5 seconds hold  ASSESSMENT:  CLINICAL IMPRESSION: [name] notes some progress with her back pain since starting physical therapy.  She can still have episodes of pain that are severe with radicular symptoms that affect ADLs.  Objectively, [name] is significantly improved with her standing posture and hip flexibility and will continue to benefit from work in these areas along with strengthening and endurance work to allow her to be more independent with ADLs.  OBJECTIVE IMPAIRMENTS: Abnormal gait, cardiopulmonary status limiting activity, decreased activity tolerance, decreased endurance, decreased knowledge of condition,  difficulty walking, decreased ROM, decreased strength, decreased safety awareness, impaired perceived functional ability, increased muscle spasms, impaired flexibility, improper body mechanics, postural dysfunction, obesity, and pain.   ACTIVITY LIMITATIONS: carrying, lifting, bending, standing, sleeping, stairs, bed mobility, and locomotion  level  PARTICIPATION LIMITATIONS: meal prep, cleaning, laundry, driving, shopping, community activity, and yard work  PERSONAL FACTORS: Anxiety, compressed vertebrae, hypothyroid, hypertension, pulmonary embolism, heart failure are also affecting patient's functional outcome.   REHAB POTENTIAL: Good  CLINICAL DECISION MAKING: Evolving/moderate complexity  EVALUATION COMPLEXITY: Moderate   GOALS: Goals reviewed with patient? Yes  SHORT TERM GOALS: Target date: 04/15/2024  [name] will be independent with her day 1 home exercise program Baseline: Started 03/18/2024 Goal status: Met 04/07/2024  2.  Improve lumbar extension AROM to neutral Baseline: -20 degrees Goal status: Met 04/05/2024  3.  Improve bilateral lower extremity flexibility for hip flexors to 90 degrees; hamstrings to 50 degrees and bilateral hip external rotation to 25 degrees Baseline: 80/80; 40/45 and 14/17 respectively Goal status: Partially met 04/07/2024   LONG TERM GOALS: Target date: 05/13/2024  Improve patient-specific functional scale to at least 4 Baseline: 1 Goal status: INITIAL  2.  [name] will report low back and left hip pain consistently 0-3/10 on the numeric pain rating scale Baseline: 6/10 with left radicular symptoms as distal as the foot Goal status: Ongoing 04/07/2024  3.  [name] will be able to stand and walk for at least 10 minutes without being limited by low back, hip radicular symptoms Baseline: Unable at evaluation Goal status: Ongoing 04/07/2024  4.  [name] will not require an assistive device for ambulation Baseline: Wheeled walker at evaluation Goal status: Ongoing 04/07/2024  5.  [name] will be independent with her long-term home exercise program at discharge Baseline: Started 03/18/2024 Goal status: INITIAL  PLAN:  PT FREQUENCY: 2x/week  PT DURATION: 5 additional weeks  PLANNED INTERVENTIONS: 97750- Physical Performance Testing, 97110-Therapeutic exercises, 97530-  Therapeutic activity, [id number]- Neuromuscular re-education, 97535- Self Care, [id number]- Manual therapy, [id number]- Gait training, [phone]- Electrical stimulation (unattended), [id number]- Traction (mechanical), 20560 (1-2 muscles), 20561 (3+ muscles)- Dry Needling, Patient/Family education, Spinal mobilization, Cryotherapy, and Moist heat.  PLAN FOR NEXT SESSION: Continue low back, hip abductor and quadratus lumbar ROM strengthening along with general lower extremity strengthening.  Continued postural cues and work on posture (particularly with gait in the walker) and practical body mechanics as appropriate.   Myer LELON Ivory PT, MPT 04/07/24  12:08 PM

## 2024-04-11 ENCOUNTER — Encounter: Payer: Self-pay | Admitting: Radiology

## 2024-04-13 ENCOUNTER — Encounter: Admitting: Rehabilitative and Restorative Service Providers"

## 2024-04-13 ENCOUNTER — Encounter: Payer: Self-pay | Admitting: Rehabilitative and Restorative Service Providers"

## 2024-04-13 ENCOUNTER — Ambulatory Visit: Admitting: Rehabilitative and Restorative Service Providers"

## 2024-04-13 DIAGNOSIS — R262 Difficulty in walking, not elsewhere classified: Secondary | ICD-10-CM

## 2024-04-13 DIAGNOSIS — M6281 Muscle weakness (generalized): Secondary | ICD-10-CM

## 2024-04-13 DIAGNOSIS — R293 Abnormal posture: Secondary | ICD-10-CM

## 2024-04-13 DIAGNOSIS — M5416 Radiculopathy, lumbar region: Secondary | ICD-10-CM | POA: Diagnosis not present

## 2024-04-13 DIAGNOSIS — M5459 Other low back pain: Secondary | ICD-10-CM | POA: Diagnosis not present

## 2024-04-13 NOTE — Therapy (Signed)
 OUTPATIENT PHYSICAL THERAPY TREATMENT   Patient Name: [name] MRN: [id number] DOB:10/31/1940, 83 y.o., female Today's Date: 04/13/2024  END OF SESSION:  PT End of Session - 04/13/24 1017     Visit Number 7    Number of Visits 16    Date for Recertification  05/13/24    Authorization Type [employer]    Progress Note Due on Visit 10    PT Start Time 1010    PT Stop Time 1050    PT Time Calculation (min) 40 min    Activity Tolerance Patient limited by pain    Behavior During Therapy [facility] for tasks assessed/performed                Past Medical History:  Diagnosis Date   Anxiety    takes Lorazepam  daily as needed   Arthritis    Cancer (HCC)    thyroid    Cataracts, bilateral    immature   Chronic back pain    compressed vertebrae,takes Fosamax weekly   Family history of adverse reaction to anesthesia    granddaughter gets very sick and mean   History of blood transfusion    no abnormal reaction noted   History of bronchitis 05/2015   History of colon polyps    benign   Hyperlipidemia    takes Zetia  daily   Hypertension    takes Diltiazem  and Avapro  daily   Hypothyroidism    takes Synthroid  daily   Ischemic colitis    hx of-many yrs ago   Joint pain    Joint swelling    OSA on CPAP    05-08-12 AHi was 46, RDI  53. titrated to   9 cm water  with 3 cm flex.-nadir 75%   PE (pulmonary embolism)    takes Coumadin  daily   Peripheral edema    Pneumonia 1992   hx of   Sarcoidosis    Shortness of breath    Urinary frequency    Weakness    left leg.Numbness and tingling.Has sciatica   Wheeze    occaionally. Not new   Past Surgical History:  Procedure Laterality Date   ABDOMINAL HYSTERECTOMY     APPENDECTOMY     BREAST BIOPSY     biopsies on right x 2   BREAST CYST ASPIRATION Left    CHOLECYSTECTOMY N/A 11/29/2015   Procedure: LAPAROSCOPIC CHOLECYSTECTOMY;  Surgeon: Jina Nephew, MD;  Location: [facility] OR;  Service: General;  Laterality: N/A;    COLONOSCOPY     DILATION AND CURETTAGE OF UTERUS     LIVER BIOPSY     MELANOMA EXCISION     removed from left leg   scalene surgery  1972   trying to find out what was wrong with her lungs   THYROID  SURGERY     Patient Active Problem List   Diagnosis Date Noted   Heart failure with preserved ejection fraction (HCC) 09/26/2022   Permanent atrial fibrillation (HCC) 09/26/2022   Warfarin anticoagulation 09/18/2022   Sepsis (HCC) 09/18/2022   Sepsis due to cellulitis (HCC) 09/18/2022   Cellulitis of right leg 09/18/2022   Chronic intermittent hypoxia with obstructive sleep apnea 09/12/2021   Severe obstructive sleep apnea-hypopnea syndrome 09/12/2021   Acute medial meniscus tear of right knee 03/31/2018   Acute pain of right knee 01/12/2018   Hypercoagulation syndrome 05/24/2014   Hunter's glossitis 05/24/2014   Sarcoidosis 05/24/2014   Hypoxemia 05/24/2014   Primary hypertension 10/13/2013   Nocturnal hypoxia 05/19/2013  Obstructive sleep apnea (adult) (pediatric) 05/19/2013   Obesity 05/19/2013   OSA on CPAP    Adenopathy 05/26/2012   Cough 05/20/2012   OSA (obstructive sleep apnea) 05/20/2012   Pulmonary embolism (HCC) 04/29/2012   DVT (deep venous thrombosis) (HCC) 04/29/2012    PCP: Toribio MATSU. Yolande, MD  REFERRING PROVIDER: Duwaine BRAVO. Trudy, NP  REFERRING DIAG: M54.42,M54.41,G89.29 (ICD-10-CM) - Chronic bilateral low back pain with bilateral sciatica M54.16 (ICD-10-CM) - Lumbar radiculopathy R26.9 (ICD-10-CM) - Gait abnormality  Rationale for Evaluation and Treatment: Rehabilitation  THERAPY DIAG:  Abnormal posture  Difficulty in walking, not elsewhere classified  Muscle weakness (generalized)  Radiculopathy, lumbar region  Other low back pain  ONSET DATE: December 03, 2023  SUBJECTIVE:                                                                                                                                                                                            SUBJECTIVE STATEMENT: Pt indicated feeling more symptoms after walking on day of last visit and later after.  Pt indicated shifting weight of Lt side in sitting seems to help some.  Pt indicated symptoms were worsened for several days.  Reported some periods of reduced but symptoms return.   PERTINENT HISTORY:  Anxiety, compressed vertebrae, hypothyroid, hypertension, pulmonary embolism, heart failure  PAIN:  NPRS scale: 9/10 Pain location: Low back, left gluteals and as distal as the left ankle Pain description: Leg is cramping, low back and gluteal penetrating Aggravating factors: Cold, wet weather, standing, walking, prolonged sitting.  Relieving factors: Change of position, exercises  PRECAUTIONS: Back  RED FLAGS: None   WEIGHT BEARING RESTRICTIONS: No  FALLS:  Has patient fallen in last 6 months? No  LIVING ENVIRONMENT: Lives with: lives with their family and lives with their spouse Lives in: House/apartment Stairs: Uses handrail Has following equipment at home: Environmental Consultant - 2 wheeled  OCCUPATION: Retired  PLOF: Independent  PATIENT GOALS: Decrease the hip and leg pain.    NEXT MD VISIT: NA  OBJECTIVE:  Note: Objective measures were completed at Evaluation unless otherwise noted.  DIAGNOSTIC FINDINGS:  Moderate multilevel degenerative changes  X-rays of the pelvis show degenerative spurring of the lateral  trochanteric region of the left proximal femur.    PATIENT SURVEYS:  PSFS: THE PATIENT SPECIFIC FUNCTIONAL SCALE  Place score of 0-10 (0 = unable to perform activity and 10 = able to perform activity at the same level as before injury or problem)  Activity Date: 03/18/2024    Vacuuming 1    2.  Cooking 1    3.  Driving 1  4.  Yard work 1    Total Score 1      Total Score = Sum of activity scores/number of activities  Minimally Detectable Change: 3 points (for single activity); 2 points (for average score)  [employer]  (nd). The Patient Specific Functional Scale . Retrieved from [email or web address].pt   COGNITION: 03/18/2024 Overall cognitive status: Within functional limits for tasks assessed     SENSATION: 03/18/2024 [name] notes left sided peripheral symptoms, mostly of the hip/gluteal area and distal from the knee to the foot  MUSCLE LENGTH: 04/07/2024  Hamstrings: Right 50 deg; Left 45 deg  03/18/2024 Hamstrings: Right 45 deg; Left 40 deg  POSTURE:  03/18/2024 rounded shoulders, forward head, decreased lumbar lordosis, and flexed trunk   LUMBAR ROM:   AROM Eval 03/18/2024 03/21/2024 03/30/2024 04/05/2024 04/07/2024  Flexion       Extension -20 25 % without pain complaints.  25% WFL without pain  Repeated x 5 improved to 50% 50% WFL 0  Right lateral flexion       Left lateral flexion       Right rotation       Left rotation        (Blank rows = not tested)  LOWER EXTREMITY ROM:      Right 03/18/2024 Passive Left 03/18/2024 Passive Right 03/21/2024 Left 03/21/2024 Left/Right 04/07/2024  Hip flexion 80 80 PROM in supine 90 PROM in supine 105 90/90  Hip extension       Hip abduction       Hip adduction       Hip internal rotation 18 18   1/9  Hip external rotation 17 14   32/32  Knee flexion       Knee extension       Ankle dorsiflexion       Ankle plantarflexion       Ankle inversion       Ankle eversion        (Blank rows = not tested)  LOWER EXTREMITY MMT:  03/18/2024 Deferred secondary to discomfort in test positions  MMT    Hip flexion    Hip extension    Hip abduction    Hip adduction    Hip internal rotation    Hip external rotation    Knee flexion    Knee extension    Ankle dorsiflexion    Ankle plantarflexion    Ankle inversion    Ankle eversion     (Blank rows = not tested)  GAIT: 03/18/2024 Distance walked: 100 feet Assistive device utilized: Environmental Consultant - 2 wheeled Level of assistance: Modified  independence Comments: [name] notes that she is limited to standing and walking for short periods of time, only a few minutes                    TREATMENT          DATE: 04/13/2024 Therex: Nustep lvl 6 UE/LE for Rom 10 mins  Standing hip hike 3 sec hold x 10 in walker with UE support  Standing with UE on walker hip abduction x 10 bilateral Standing lumbar extension AROM x 5  Seated sciatic nerve flossing (knee extension c ankle PF, then knee flexion with ankle DF) x 10 bilaterally LE  Supine hip extension straight leg isometric into table 10 sec hold x 8  Supine hooklying bridge 2-3 sec hold x 15    TherActivity  Sit to stand to sit from 20 inch table s UE  assist with slow lowering focus x 5   Manual Compression to Lt glute med/min trigger points, Percussive device to same.      TREATMENT          DATE: 04/07/2024 Single knee-to-chest stretch with opposite leg straight 4 x 20 seconds Figure 4 stretch 4 x 20 seconds Standing lumbar extension AROM 10 x 3 seconds Yoga Bridge 2 sets of 10 for 5 seconds Side-lying clams with black Thera-Band resistance 2 sets of 10 for 3 seconds each side  02464: Reviewed objective measures; body mechanics including bed mobility and made updates to her home exercise program   TREATMENT          DATE: 04/05/2024 Therex: Nustep lvl 6 UE/LE for Rom 10 mins  Sidelying clam shell Lt hip 2 x 12  Standing hip hike in walker 2 sec hold x 5 bilateral (limited by pain, stopped) Supine hooklying bridge 2 x 10  Supine hooklying trunk rotation 15 sec x 3 bilateral   Neuro  (postural awareness, strengthening) Standing lumbar extension AROM 2 x 5 Seated red band rows c scapular retraction focus 2 x 10  Manual Compression to Lt glute med/min trigger points, Percussive device to same.     TREATMENT          DATE: 04/01/2024 Therex: Nustep lvl 5 UE/LE for Rom 10 mins  Sidelying clam shell Lt 3 x 10 Supine lumbar trunk rotation stretch 15 sec x 3  bilateral Supine SKC with strap assist 20 sec x 3 bilaterally  Supine one leg outstretched hip extension isometric into table 5 sec hold 3 x 10 Lt, x 10 Rt.  Lumbar extension AROM review.    Manual Compression to Lt glute med/min trigger points, Percussive device to same.   TherActivity Cues verbally and visually for log rolling techniques in supine to sit to supine bed mobility.  Detailed procedures to improve independence and reduce lumbar strain in mobility.   Supine bridge x 15 1-2 second hold (to improve bed mobility)  Sit to stand to sit x 5 without UE  - 19.5 inch table height.                                                                                                                            PATIENT EDUCATION:  03/18/2024 Education details: See above Person educated: Patient and Spouse Education method: Explanation, Demonstration, Tactile cues, Verbal cues, and Handouts Education comprehension: verbalized understanding, returned demonstration, verbal cues required, tactile cues required, and needs further education  HOME EXERCISE PROGRAM: Access Code: [email or web address] URL: [email or web address] Date: 04/07/2024 Prepared by: Lamar Ivory  Exercises - Single Knee to Chest Stretch  - 2 x daily - 7 x weekly - 1 sets - 5 reps - 20 seconds hold - Supine Figure 4 Piriformis Stretch  - 2 x daily - 7 x weekly - 1 sets - 5 reps - 20 seconds hold - Clamshell  - 2 x daily - 7  x weekly - 2 sets - 10 reps - 3 seconds hold - Supine Lower Trunk Rotation  - 2 x daily - 1 x weekly - 1 sets - 3-5 reps - 15 hold - Standing Lumbar Extension at Wall - Forearms  - 5 x daily - 7 x weekly - 1 sets - 5 reps - 3 seconds hold - Yoga Bridge  - 2 x daily - 7 x weekly - 2 sets - 10 reps - 5 seconds hold  ASSESSMENT:  CLINICAL IMPRESSION: Discussed with patient option for setting up follow up with referring provider due to high severity of symptom levels that have had some short term  improvements but routinely reach high levels.  Provided education that returning to provider can be done in conjunction with plan for continued skilled PT services.   Continued efforts on mobility gains for lumbar and hip mobility as well as strength, endurance improvements to help improve ambulation and tolerance to daily activity.    OBJECTIVE IMPAIRMENTS: Abnormal gait, cardiopulmonary status limiting activity, decreased activity tolerance, decreased endurance, decreased knowledge of condition, difficulty walking, decreased ROM, decreased strength, decreased safety awareness, impaired perceived functional ability, increased muscle spasms, impaired flexibility, improper body mechanics, postural dysfunction, obesity, and pain.   ACTIVITY LIMITATIONS: carrying, lifting, bending, standing, sleeping, stairs, bed mobility, and locomotion level  PARTICIPATION LIMITATIONS: meal prep, cleaning, laundry, driving, shopping, community activity, and yard work  PERSONAL FACTORS: Anxiety, compressed vertebrae, hypothyroid, hypertension, pulmonary embolism, heart failure are also affecting patient's functional outcome.   REHAB POTENTIAL: Good  CLINICAL DECISION MAKING: Evolving/moderate complexity  EVALUATION COMPLEXITY: Moderate   GOALS: Goals reviewed with patient? Yes  SHORT TERM GOALS: Target date: 04/15/2024  [name] will be independent with her day 1 home exercise program Baseline: Started 03/18/2024 Goal status: Met 04/07/2024  2.  Improve lumbar extension AROM to neutral Baseline: -20 degrees Goal status: Met 04/05/2024  3.  Improve bilateral lower extremity flexibility for hip flexors to 90 degrees; hamstrings to 50 degrees and bilateral hip external rotation to 25 degrees Baseline: 80/80; 40/45 and 14/17 respectively Goal status: Partially met 04/07/2024   LONG TERM GOALS: Target date: 05/13/2024  Improve patient-specific functional scale to at least 4 Baseline: 1 Goal status: on  going 04/13/2024  2.  [name] will report low back and left hip pain consistently 0-3/10 on the numeric pain rating scale Baseline: 6/10 with left radicular symptoms as distal as the foot Goal status: on going 04/13/2024  3.  [name] will be able to stand and walk for at least 10 minutes without being limited by low back, hip radicular symptoms Baseline: Unable at evaluation Goal status: on going 04/13/2024  4.  [name] will not require an assistive device for ambulation Baseline: Wheeled walker at evaluation Goal status: on going 04/13/2024  5.  [name] will be independent with her long-term home exercise program at discharge Baseline: Started 03/18/2024 Goal status: on going 04/13/2024  PLAN:  PT FREQUENCY: 2x/week  PT DURATION: 5 additional weeks  PLANNED INTERVENTIONS: 97750- Physical Performance Testing, 97110-Therapeutic exercises, 97530- Therapeutic activity, 97112- Neuromuscular re-education, 97535- Self Care, [id number]- Manual therapy, [id number]- Gait training, [phone]- Electrical stimulation (unattended), [id number]- Traction (mechanical), 20560 (1-2 muscles), 20561 (3+ muscles)- Dry Needling, Patient/Family education, Spinal mobilization, Cryotherapy, and Moist heat.  PLAN FOR NEXT SESSION:  Percussive device for symptom relief in clinci as desired.  Continue to improve strength/stability in LE for ambulation improvements.    Ozell Silvan, PT, DPT, OCS, ATC 04/13/24  10:53 AM

## 2024-04-14 DIAGNOSIS — L570 Actinic keratosis: Secondary | ICD-10-CM | POA: Diagnosis not present

## 2024-04-14 DIAGNOSIS — Z85820 Personal history of malignant melanoma of skin: Secondary | ICD-10-CM | POA: Diagnosis not present

## 2024-04-14 DIAGNOSIS — L304 Erythema intertrigo: Secondary | ICD-10-CM | POA: Diagnosis not present

## 2024-04-14 DIAGNOSIS — D485 Neoplasm of uncertain behavior of skin: Secondary | ICD-10-CM | POA: Diagnosis not present

## 2024-04-14 DIAGNOSIS — L821 Other seborrheic keratosis: Secondary | ICD-10-CM | POA: Diagnosis not present

## 2024-04-15 ENCOUNTER — Encounter: Payer: Self-pay | Admitting: Rehabilitative and Restorative Service Providers"

## 2024-04-15 ENCOUNTER — Ambulatory Visit: Admitting: Rehabilitative and Restorative Service Providers"

## 2024-04-15 DIAGNOSIS — R262 Difficulty in walking, not elsewhere classified: Secondary | ICD-10-CM

## 2024-04-15 DIAGNOSIS — R293 Abnormal posture: Secondary | ICD-10-CM | POA: Diagnosis not present

## 2024-04-15 DIAGNOSIS — M5459 Other low back pain: Secondary | ICD-10-CM | POA: Diagnosis not present

## 2024-04-15 DIAGNOSIS — M6281 Muscle weakness (generalized): Secondary | ICD-10-CM | POA: Diagnosis not present

## 2024-04-15 DIAGNOSIS — M5416 Radiculopathy, lumbar region: Secondary | ICD-10-CM | POA: Diagnosis not present

## 2024-04-15 NOTE — Therapy (Signed)
 OUTPATIENT PHYSICAL THERAPY TREATMENT/PROGRESS NOTE  Progress Note Reporting Period 03/18/2024 to 04/15/2024  See note below for Objective Data and Assessment of Progress/Goals.      Patient Name: [name] MRN: [id number] DOB:January 01, 1941, 83 y.o., female Today's Date: 04/15/2024  END OF SESSION:  PT End of Session - 04/15/24 0930     Visit Number 8    Number of Visits 16    Date for Recertification  05/13/24    Authorization Type [employer]    Progress Note Due on Visit 10    PT Start Time 0930    PT Stop Time 1014    PT Time Calculation (min) 44 min    Activity Tolerance Patient limited by pain;Patient tolerated treatment well;No increased pain    Behavior During Therapy WFL for tasks assessed/performed           Past Medical History:  Diagnosis Date   Anxiety    takes Lorazepam  daily as needed   Arthritis    Cancer (HCC)    thyroid    Cataracts, bilateral    immature   Chronic back pain    compressed vertebrae,takes Fosamax weekly   Family history of adverse reaction to anesthesia    granddaughter gets very sick and mean   History of blood transfusion    no abnormal reaction noted   History of bronchitis 05/2015   History of colon polyps    benign   Hyperlipidemia    takes Zetia  daily   Hypertension    takes Diltiazem  and Avapro  daily   Hypothyroidism    takes Synthroid  daily   Ischemic colitis    hx of-many yrs ago   Joint pain    Joint swelling    OSA on CPAP    05-08-12 AHi was 46, RDI  53. titrated to   9 cm water  with 3 cm flex.-nadir 75%   PE (pulmonary embolism)    takes Coumadin  daily   Peripheral edema    Pneumonia 1992   hx of   Sarcoidosis    Shortness of breath    Urinary frequency    Weakness    left leg.Numbness and tingling.Has sciatica   Wheeze    occaionally. Not new   Past Surgical History:  Procedure Laterality Date   ABDOMINAL HYSTERECTOMY     APPENDECTOMY     BREAST BIOPSY     biopsies on right x 2    BREAST CYST ASPIRATION Left    CHOLECYSTECTOMY N/A 11/29/2015   Procedure: LAPAROSCOPIC CHOLECYSTECTOMY;  Surgeon: Jina Nephew, MD;  Location: [facility] OR;  Service: General;  Laterality: N/A;   COLONOSCOPY     DILATION AND CURETTAGE OF UTERUS     LIVER BIOPSY     MELANOMA EXCISION     removed from left leg   scalene surgery  1972   trying to find out what was wrong with her lungs   THYROID  SURGERY     Patient Active Problem List   Diagnosis Date Noted   Heart failure with preserved ejection fraction (HCC) 09/26/2022   Permanent atrial fibrillation (HCC) 09/26/2022   Warfarin anticoagulation 09/18/2022   Sepsis (HCC) 09/18/2022   Sepsis due to cellulitis (HCC) 09/18/2022   Cellulitis of right leg 09/18/2022   Chronic intermittent hypoxia with obstructive sleep apnea 09/12/2021   Severe obstructive sleep apnea-hypopnea syndrome 09/12/2021   Acute medial meniscus tear of right knee 03/31/2018   Acute pain of right knee 01/12/2018   Hypercoagulation syndrome 05/24/2014  Hunter's glossitis 05/24/2014   Sarcoidosis 05/24/2014   Hypoxemia 05/24/2014   Primary hypertension 10/13/2013   Nocturnal hypoxia 05/19/2013   Obstructive sleep apnea (adult) (pediatric) 05/19/2013   Obesity 05/19/2013   OSA on CPAP    Adenopathy 05/26/2012   Cough 05/20/2012   OSA (obstructive sleep apnea) 05/20/2012   Pulmonary embolism (HCC) 04/29/2012   DVT (deep venous thrombosis) (HCC) 04/29/2012    PCP: Toribio MATSU. Yolande, MD  REFERRING PROVIDER: Duwaine BRAVO. Trudy, NP  REFERRING DIAG: M54.42,M54.41,G89.29 (ICD-10-CM) - Chronic bilateral low back pain with bilateral sciatica M54.16 (ICD-10-CM) - Lumbar radiculopathy R26.9 (ICD-10-CM) - Gait abnormality  Rationale for Evaluation and Treatment: Rehabilitation  THERAPY DIAG:  Abnormal posture  Difficulty in walking, not elsewhere classified  Muscle weakness (generalized)  Radiculopathy, lumbar region  Other low back pain  ONSET DATE: December 03, 2023  SUBJECTIVE:                                                                                                                                                                                           SUBJECTIVE STATEMENT: Saumya reports she gets increased low back and left gluteal pain with an increase in walking.  She has been very compliant with her home exercises.  PERTINENT HISTORY:  Anxiety, compressed vertebrae, hypothyroid, hypertension, pulmonary embolism, heart failure  PAIN:  NPRS scale: As high as 7/10 this week Pain location: Low back, left gluteals and can be as distal as the left ankle Pain description: Leg pain is cramping, low back and gluteal pain is burning Aggravating factors: Cold, wet weather, standing, walking, prolonged sitting  Relieving factors: Change of position, exercises  PRECAUTIONS: Back  RED FLAGS: None   WEIGHT BEARING RESTRICTIONS: No  FALLS:  Has patient fallen in last 6 months? No  LIVING ENVIRONMENT: Lives with: lives with their family and lives with their spouse Lives in: House/apartment Stairs: Uses handrail Has following equipment at home: Environmental Consultant - 2 wheeled  OCCUPATION: Retired  PLOF: Independent  PATIENT GOALS: Decrease the hip and leg pain.    NEXT MD VISIT: NA  OBJECTIVE:  Note: Objective measures were completed at Evaluation unless otherwise noted.  DIAGNOSTIC FINDINGS:  Moderate multilevel degenerative changes  X-rays of the pelvis show degenerative spurring of the lateral  trochanteric region of the left proximal femur.    PATIENT SURVEYS:  PSFS: THE PATIENT SPECIFIC FUNCTIONAL SCALE  Place score of 0-10 (0 = unable to perform activity and 10 = able to perform activity at the same level as before injury or problem)  Activity Date: 03/18/2024    Vacuuming 1    2.  Cooking  1    3.  Driving 1    4.  Yard work 1    Total Score 1      Total Score = Sum of activity scores/number of activities  Minimally  Detectable Change: 3 points (for single activity); 2 points (for average score)  [employer] (nd). The Patient Specific Functional Scale . Retrieved from [email or web address]   COGNITION: 03/18/2024 Overall cognitive status: Within functional limits for tasks assessed     SENSATION: 03/18/2024 [name] notes left sided peripheral symptoms, mostly of the hip/gluteal area and distal from the knee to the foot  MUSCLE LENGTH: 04/15/2024  Hamstrings: Right 55 deg; Left 50 deg  04/07/2024  Hamstrings: Right 50 deg; Left 45 deg  03/18/2024 Hamstrings: Right 45 deg; Left 40 deg  POSTURE:  03/18/2024 rounded shoulders, forward head, decreased lumbar lordosis, and flexed trunk   LUMBAR ROM:   AROM Eval 03/18/2024 03/21/2024 03/30/2024 04/05/2024 04/07/2024 04/15/2024  Flexion        Extension -20 25 % without pain complaints.  25% WFL without pain  Repeated x 5 improved to 50% 50% WFL 0 0  Right lateral flexion        Left lateral flexion        Right rotation        Left rotation         (Blank rows = not tested)  LOWER EXTREMITY ROM:      Right 03/18/2024 Passive Left 03/18/2024 Passive Right 03/21/2024 Left 03/21/2024 Left/Right 04/07/2024 Left/Right 04/15/2024  Hip flexion 80 80 PROM in supine 90 PROM in supine 105 90/90 90/90  Hip extension        Hip abduction        Hip adduction        Hip internal rotation 18 18   1/9 12/12  Hip external rotation 17 14   32/32 38/33  Knee flexion        Knee extension        Ankle dorsiflexion        Ankle plantarflexion        Ankle inversion        Ankle eversion         (Blank rows = not tested)  LOWER EXTREMITY MMT:  03/18/2024 Deferred secondary to discomfort in test positions  MMT    Hip flexion    Hip extension    Hip abduction    Hip adduction    Hip internal rotation    Hip external rotation    Knee flexion    Knee extension    Ankle  dorsiflexion    Ankle plantarflexion    Ankle inversion    Ankle eversion     (Blank rows = not tested)  GAIT: 03/18/2024 Distance walked: 100 feet Assistive device utilized: Environmental Consultant - 2 wheeled Level of assistance: Modified independence Comments: Kristine notes that she is limited to standing and walking for short periods of time, only a few minutes                    TREATMENT          DATE: 04/15/2024 Single knee-to-chest stretch with opposite leg straight 4 x 20 seconds Figure 4 stretch 4 x 20 seconds Standing lumbar extension AROM 10 x 3 seconds Yoga Bridge 15 for 5 seconds Side-lying clams with black Thera-Band resistance Home exercise program  Functional Activities: Sit to stand with SBP 2 sets of 5 for postural correction and  improving body mechanics Standing heel-to-toe raises 10 x 3 seconds (postural correction to avoid for flexion of the hips) Standing toe raises 10 x 3 seconds (postural correction to avoid forward flexion at the hips) Practical logroll for bed mobility and reviewed the importance of frequent changes of position, particularly to avoid prolonged sitting  02464: Reviewed today's objective measures; reviewed body mechanics including bed mobility, postural cues and made appropriate updates to her home exercise program with emphasis on low back strengthening   04/13/2024 Therex: Nustep lvl 6 UE/LE for Rom 10 mins  Standing hip hike 3 sec hold x 10 in walker with UE support  Standing with UE on walker hip abduction x 10 bilateral Standing lumbar extension AROM x 5  Seated sciatic nerve flossing (knee extension c ankle PF, then knee flexion with ankle DF) x 10 bilaterally LE  Supine hip extension straight leg isometric into table 10 sec hold x 8  Supine hooklying bridge 2-3 sec hold x 15   TherActivity  Sit to stand to sit from 20 inch table s UE assist with slow lowering focus x 5   Manual Compression to Lt glute med/min trigger points, Percussive device  to same.     04/07/2024 Single knee-to-chest stretch with opposite leg straight 4 x 20 seconds Figure 4 stretch 4 x 20 seconds Standing lumbar extension AROM 10 x 3 seconds Yoga Bridge 2 sets of 10 for 5 seconds Side-lying clams with black Thera-Band resistance 2 sets of 10 for 3 seconds each side  02464: Reviewed objective measures; body mechanics including bed mobility and made updates to her home exercise program                                                                                                                           PATIENT EDUCATION:  03/18/2024 Education details: See above Person educated: Patient and Spouse Education method: Explanation, Demonstration, Tactile cues, Verbal cues, and Handouts Education comprehension: verbalized understanding, returned demonstration, verbal cues required, tactile cues required, and needs further education  HOME EXERCISE PROGRAM: Access Code: [email or web address] URL: [email or web address] Date: 04/15/2024 Prepared by: Lamar Ivory  Exercises - Single Knee to Chest Stretch  - 2 x daily - 7 x weekly - 1 sets - 5 reps - 20 seconds hold - Supine Figure 4 Piriformis Stretch  - 1 x daily - 7 x weekly - 1 sets - 5 reps - 20 seconds hold - Clamshell  - 2 x daily - 7 x weekly - 2 sets - 10 reps - 3 seconds hold - Supine Lower Trunk Rotation  - 2 x daily - 1 x weekly - 1 sets - 3-5 reps - 15 hold - Standing Lumbar Extension at Wall - Forearms  - 5 x daily - 7 x weekly - 1 sets - 5 reps - 3 seconds hold - Yoga Bridge  - 2 x daily - 7 x weekly - 2 sets - 10 reps - 5 seconds hold -  Heel Toe Raises with Counter Support  - 3 x daily - 7 x weekly - 1 sets - 10 reps - 3 seconds hold - Sit to Stand Without Arm Support  - 3 x daily - 7 x weekly - 1 sets - 5 reps  ASSESSMENT:  CLINICAL IMPRESSION: Landree has improved her posture and body mechanics awareness.  She is able to make changes in position during the day to reduce her pain.  If  appropriate, she might be a candidate for an epidural injection as her pain remains high even with an appropriate conservative physical therapy regiment.  Laelah will also benefit from an MRI to give additional information which may help with deciding whether or not an epidural is appropriate and for referral to a spine specialist.  Physical therapy will continue to focus on postural and low back strength work to improve her standing and walking endurance.  Kayse will likely need additional medical interventions to significantly reduce her pain.  OBJECTIVE IMPAIRMENTS: Abnormal gait, cardiopulmonary status limiting activity, decreased activity tolerance, decreased endurance, decreased knowledge of condition, difficulty walking, decreased ROM, decreased strength, decreased safety awareness, impaired perceived functional ability, increased muscle spasms, impaired flexibility, improper body mechanics, postural dysfunction, obesity, and pain.   ACTIVITY LIMITATIONS: carrying, lifting, bending, standing, sleeping, stairs, bed mobility, and locomotion level  PARTICIPATION LIMITATIONS: meal prep, cleaning, laundry, driving, shopping, community activity, and yard work  PERSONAL FACTORS: Anxiety, compressed vertebrae, hypothyroid, hypertension, pulmonary embolism, heart failure are also affecting patient's functional outcome.   REHAB POTENTIAL: Good  CLINICAL DECISION MAKING: Evolving/moderate complexity  EVALUATION COMPLEXITY: Moderate   GOALS: Goals reviewed with patient? Yes  SHORT TERM GOALS: Target date: 04/15/2024  Roslyn will be independent with her day 1 home exercise program Baseline: Started 03/18/2024 Goal status: Met 04/07/2024  2.  Improve lumbar extension AROM to neutral Baseline: -20 degrees Goal status: Met 04/05/2024  3.  Improve bilateral lower extremity flexibility for hip flexors to 90 degrees; hamstrings to 50 degrees and bilateral hip external rotation to 25  degrees Baseline: 80/80; 40/45 and 14/17 respectively Goal status: Met 04/15/2024   LONG TERM GOALS: Target date: 05/13/2024  Improve patient-specific functional scale to at least 4 Baseline: 1 Goal status: on going 04/13/2024  2.  Taquita will report low back and left hip pain consistently 0-3/10 on the numeric pain rating scale Baseline: 6/10 with left radicular symptoms as distal as the foot Goal status: on going 04/15/2024  3.  Nakshatra will be able to stand and walk for at least 10 minutes without being limited by low back, hip radicular symptoms Baseline: Unable at evaluation Goal status: on going 04/15/2024  4.  Bayyinah will not require an assistive device for ambulation Baseline: Wheeled walker at evaluation Goal status: on going 04/15/2024  5.  Caydee will be independent with her long-term home exercise program at discharge Baseline: Started 03/18/2024 Goal status: on going 04/15/2024  PLAN:  PT FREQUENCY: 2x/week  PT DURATION: 4 additional weeks  PLANNED INTERVENTIONS: 97750- Physical Performance Testing, 97110-Therapeutic exercises, 97530- Therapeutic activity, 97112- Neuromuscular re-education, 97535- Self Care, 02859- Manual therapy, [id number]- Gait training, [phone]- Electrical stimulation (unattended), [id number]- Traction (mechanical), 20560 (1-2 muscles), 20561 (3+ muscles)- Dry Needling, Patient/Family education, Spinal mobilization, Cryotherapy, and Moist heat.  PLAN FOR NEXT SESSION: Postural, scapular and low back strength work to improve standing and walking endurance.  Myer LELON Ivory PT, MPT 04/15/24  4:42 PM

## 2024-04-18 ENCOUNTER — Telehealth: Payer: Self-pay | Admitting: Physical Medicine and Rehabilitation

## 2024-04-18 NOTE — Telephone Encounter (Signed)
 Pt called stating she has been going to PT a total of 8 sessions and they are not helping and PT advised to to call for next plan of care with PA Megan. Please call pt at [phone].

## 2024-04-21 ENCOUNTER — Encounter: Payer: Self-pay | Admitting: Rehabilitative and Restorative Service Providers"

## 2024-04-21 ENCOUNTER — Ambulatory Visit: Admitting: Rehabilitative and Restorative Service Providers"

## 2024-04-21 DIAGNOSIS — M6281 Muscle weakness (generalized): Secondary | ICD-10-CM

## 2024-04-21 DIAGNOSIS — M5416 Radiculopathy, lumbar region: Secondary | ICD-10-CM | POA: Diagnosis not present

## 2024-04-21 DIAGNOSIS — M5459 Other low back pain: Secondary | ICD-10-CM

## 2024-04-21 DIAGNOSIS — R262 Difficulty in walking, not elsewhere classified: Secondary | ICD-10-CM | POA: Diagnosis not present

## 2024-04-21 DIAGNOSIS — R293 Abnormal posture: Secondary | ICD-10-CM | POA: Diagnosis not present

## 2024-04-21 NOTE — Therapy (Signed)
 OUTPATIENT PHYSICAL THERAPY TREATMENT NOTE   Patient Name: DAVIE SAGONA MRN: [id number] DOB:[birth year], 83 y.o., female Today's Date: 04/21/2024  END OF SESSION:  PT End of Session - 04/21/24 1053     Visit Number 9    Number of Visits 16    Date for Recertification  05/13/24    Authorization Type [employer]    Progress Note Due on Visit 16    PT Start Time 1052    PT Stop Time 1145    PT Time Calculation (min) 53 min    Activity Tolerance Patient limited by pain;Patient tolerated treatment well;No increased pain    Behavior During Therapy WFL for tasks assessed/performed            Past Medical History:  Diagnosis Date   Anxiety    takes Lorazepam  daily as needed   Arthritis    Cancer (HCC)    thyroid    Cataracts, bilateral    immature   Chronic back pain    compressed vertebrae,takes Fosamax weekly   Family history of adverse reaction to anesthesia    granddaughter gets very sick and mean   History of blood transfusion    no abnormal reaction noted   History of bronchitis 05/2015   History of colon polyps    benign   Hyperlipidemia    takes Zetia  daily   Hypertension    takes Diltiazem  and Avapro  daily   Hypothyroidism    takes Synthroid  daily   Ischemic colitis    hx of-many yrs ago   Joint pain    Joint swelling    OSA on CPAP    05-08-12 AHi was 46, RDI  53. titrated to   9 cm water  with 3 cm flex.-nadir 75%   PE (pulmonary embolism)    takes Coumadin  daily   Peripheral edema    Pneumonia 1992   hx of   Sarcoidosis    Shortness of breath    Urinary frequency    Weakness    left leg.Numbness and tingling.Has sciatica   Wheeze    occaionally. Not new   Past Surgical History:  Procedure Laterality Date   ABDOMINAL HYSTERECTOMY     APPENDECTOMY     BREAST BIOPSY     biopsies on right x 2   BREAST CYST ASPIRATION Left    CHOLECYSTECTOMY N/A 11/29/2015   Procedure: LAPAROSCOPIC CHOLECYSTECTOMY;  Surgeon: Jina Nephew, MD;   Location: [facility] OR;  Service: General;  Laterality: N/A;   COLONOSCOPY     DILATION AND CURETTAGE OF UTERUS     LIVER BIOPSY     MELANOMA EXCISION     removed from left leg   scalene surgery  1972   trying to find out what was wrong with her lungs   THYROID  SURGERY     Patient Active Problem List   Diagnosis Date Noted   Heart failure with preserved ejection fraction (HCC) 09/26/2022   Permanent atrial fibrillation (HCC) 09/26/2022   Warfarin anticoagulation 09/18/2022   Sepsis (HCC) 09/18/2022   Sepsis due to cellulitis (HCC) 09/18/2022   Cellulitis of right leg 09/18/2022   Chronic intermittent hypoxia with obstructive sleep apnea 09/12/2021   Severe obstructive sleep apnea-hypopnea syndrome 09/12/2021   Acute medial meniscus tear of right knee 03/31/2018   Acute pain of right knee 01/12/2018   Hypercoagulation syndrome 05/24/2014   Hunter's glossitis 05/24/2014   Sarcoidosis 05/24/2014   Hypoxemia 05/24/2014   Primary hypertension 10/13/2013   Nocturnal hypoxia  05/19/2013   Obstructive sleep apnea (adult) (pediatric) 05/19/2013   Obesity 05/19/2013   OSA on CPAP    Adenopathy 05/26/2012   Cough 05/20/2012   OSA (obstructive sleep apnea) 05/20/2012   Pulmonary embolism (HCC) 04/29/2012   DVT (deep venous thrombosis) (HCC) 04/29/2012    PCP: Toribio MATSU. Yolande, MD  REFERRING PROVIDER: Duwaine BRAVO. Trudy, NP  REFERRING DIAG: M54.42,M54.41,G89.29 (ICD-10-CM) - Chronic bilateral low back pain with bilateral sciatica M54.16 (ICD-10-CM) - Lumbar radiculopathy R26.9 (ICD-10-CM) - Gait abnormality  Rationale for Evaluation and Treatment: Rehabilitation  THERAPY DIAG:  Abnormal posture  Difficulty in walking, not elsewhere classified  Muscle weakness (generalized)  Radiculopathy, lumbar region  Other low back pain  ONSET DATE: December 03, 2023  SUBJECTIVE:                                                                                                                                                                                            SUBJECTIVE STATEMENT: Aleka reports frustration with the lack of progress in her pain.  Although she has been very compliant with her home exercises, she does not feel her pain has made significant progress and agreed with my recommendation to be referred back to Megan E Williams NP for further medical management.  PERTINENT HISTORY:  Anxiety, compressed vertebrae, hypothyroid, hypertension, pulmonary embolism, heart failure  PAIN:  NPRS scale: As high as 7/10 this week, particularly uncomfortable today Pain location: Low back, left gluteals and can be as distal as the left ankle Pain description: Leg pain is cramping, low back and gluteal pain is burning Aggravating factors: Cold, wet weather, standing, walking, prolonged sitting  Relieving factors: Change of position, exercises  PRECAUTIONS: Back  RED FLAGS: None   WEIGHT BEARING RESTRICTIONS: No  FALLS:  Has patient fallen in last 6 months? No  LIVING ENVIRONMENT: Lives with: lives with their family and lives with their spouse Lives in: House/apartment Stairs: Uses handrail Has following equipment at home: Environmental Consultant - 2 wheeled  OCCUPATION: Retired  PLOF: Independent  PATIENT GOALS: Decrease the hip and leg pain.    NEXT MD VISIT: NA  OBJECTIVE:  Note: Objective measures were completed at Evaluation unless otherwise noted.  DIAGNOSTIC FINDINGS:  Moderate multilevel degenerative changes  X-rays of the pelvis show degenerative spurring of the lateral  trochanteric region of the left proximal femur.    PATIENT SURVEYS:  PSFS: THE PATIENT SPECIFIC FUNCTIONAL SCALE  Place score of 0-10 (0 = unable to perform activity and 10 = able to perform activity at the same level as before injury or problem)  Activity Date: 03/18/2024    Vacuuming  1    2.  Cooking 1    3.  Driving 1    4.  Yard work 1    Total Score 1      Total Score = Sum of activity  scores/number of activities  Minimally Detectable Change: 3 points (for single activity); 2 points (for average score)  [employer] (nd). The Patient Specific Functional Scale . Retrieved from [email or web address].pt   COGNITION: 03/18/2024 Overall cognitive status: Within functional limits for tasks assessed     SENSATION: 03/18/2024 [name] notes left sided peripheral symptoms, mostly of the hip/gluteal area and distal from the knee to the foot  MUSCLE LENGTH: 04/15/2024  Hamstrings: Right 55 deg; Left 50 deg  04/07/2024  Hamstrings: Right 50 deg; Left 45 deg  03/18/2024 Hamstrings: Right 45 deg; Left 40 deg  POSTURE:  03/18/2024 rounded shoulders, forward head, decreased lumbar lordosis, and flexed trunk   LUMBAR ROM:   AROM Eval 03/18/2024 03/21/2024 03/30/2024 04/05/2024 04/07/2024 04/15/2024  Flexion        Extension -20 25 % without pain complaints.  25% WFL without pain  Repeated x 5 improved to 50% 50% WFL 0 0  Right lateral flexion        Left lateral flexion        Right rotation        Left rotation         (Blank rows = not tested)  LOWER EXTREMITY ROM:      Right 03/18/2024 Passive Left 03/18/2024 Passive Right 03/21/2024 Left 03/21/2024 Left/Right 04/07/2024 Left/Right 04/15/2024  Hip flexion 80 80 PROM in supine 90 PROM in supine 105 90/90 90/90  Hip extension        Hip abduction        Hip adduction        Hip internal rotation 18 18   1/9 12/12  Hip external rotation 17 14   32/32 38/33  Knee flexion        Knee extension        Ankle dorsiflexion        Ankle plantarflexion        Ankle inversion        Ankle eversion         (Blank rows = not tested)  LOWER EXTREMITY MMT:  03/18/2024 Deferred secondary to discomfort in test positions  MMT    Hip flexion    Hip extension    Hip abduction    Hip adduction    Hip internal rotation    Hip external rotation    Knee  flexion    Knee extension    Ankle dorsiflexion    Ankle plantarflexion    Ankle inversion    Ankle eversion     (Blank rows = not tested)  GAIT: 03/18/2024 Distance walked: 100 feet Assistive device utilized: Environmental Consultant - 2 wheeled Level of assistance: Modified independence Comments: Correne notes that she is limited to standing and walking for short periods of time, only a few minutes                    TREATMENT          DATE:  04/21/2024 Single knee-to-chest stretch with opposite leg straight 4 x 20 seconds Figure 4 stretch 4 x 20 seconds Standing lumbar extension AROM Held today  Yoga Bridge 2 sets of 10 for 5 seconds Side-lying clams with black Thera-Band resistance 10 x each side and 10 times supine (consider  staying supine moving forwards secondary to difficulty with lying on the left side)  02464: Reviewed the most recent objective measures; discussed next steps including recommendations to try to get authorization for an MRI (denied by insurance previously) and to talk to [facility] about a possible epidural injection (if she is a candidate); reviewed her entire home exercise program with emphasis on activities completed in the office today.  A large chunk of today's visit with Milika was talking about next steps and things we might be able to do to decrease her pain while still participating in her exercises for long-term strength and flexibility gains.   04/15/2024 Single knee-to-chest stretch with opposite leg straight 4 x 20 seconds Figure 4 stretch 4 x 20 seconds Standing lumbar extension AROM 10 x 3 seconds Yoga Bridge 15 for 5 seconds Side-lying clams with black Thera-Band resistance Home exercise program  Functional Activities: Sit to stand with SBP 2 sets of 5 for postural correction and improving body mechanics Standing heel-to-toe raises 10 x 3 seconds (postural correction to avoid for flexion of the hips) Standing toe raises 10 x 3 seconds (postural correction to  avoid forward flexion at the hips) Practical logroll for bed mobility and reviewed the importance of frequent changes of position, particularly to avoid prolonged sitting  02464: Reviewed today's objective measures; reviewed body mechanics including bed mobility, postural cues and made appropriate updates to her home exercise program with emphasis on low back strengthening   04/13/2024 Therex: Nustep lvl 6 UE/LE for Rom 10 mins  Standing hip hike 3 sec hold x 10 in walker with UE support  Standing with UE on walker hip abduction x 10 bilateral Standing lumbar extension AROM x 5  Seated sciatic nerve flossing (knee extension c ankle PF, then knee flexion with ankle DF) x 10 bilaterally LE  Supine hip extension straight leg isometric into table 10 sec hold x 8  Supine hooklying bridge 2-3 sec hold x 15   TherActivity  Sit to stand to sit from 20 inch table s UE assist with slow lowering focus x 5   Manual Compression to Lt glute med/min trigger points, Percussive device to same.                                                                                                                      PATIENT EDUCATION:  03/18/2024 Education details: See above Person educated: Patient and Spouse Education method: Explanation, Demonstration, Tactile cues, Verbal cues, and Handouts Education comprehension: verbalized understanding, returned demonstration, verbal cues required, tactile cues required, and needs further education  HOME EXERCISE PROGRAM: Access Code: [email or web address] URL: [email or web address] Date: 04/15/2024 Prepared by: Lamar Ivory  Exercises - Single Knee to Chest Stretch  - 2 x daily - 7 x weekly - 1 sets - 5 reps - 20 seconds hold - Supine Figure 4 Piriformis Stretch  - 1 x daily - 7 x weekly - 1 sets - 5 reps - 20 seconds hold - Clamshell  - 2  x daily - 7 x weekly - 2 sets - 10 reps - 3 seconds hold - Supine Lower Trunk Rotation  - 2 x daily - 1 x weekly - 1  sets - 3-5 reps - 15 hold - Standing Lumbar Extension at Wall - Forearms  - 5 x daily - 7 x weekly - 1 sets - 5 reps - 3 seconds hold - Yoga Bridge  - 2 x daily - 7 x weekly - 2 sets - 10 reps - 5 seconds hold - Heel Toe Raises with Counter Support  - 3 x daily - 7 x weekly - 1 sets - 10 reps - 3 seconds hold - Sit to Stand Without Arm Support  - 3 x daily - 7 x weekly - 1 sets - 5 reps  ASSESSMENT:  CLINICAL IMPRESSION: Katha .  She is able to make changes in position during the day t as had been a particularly painful day today.  She feels as if she is doing everything she can and she needs more than just physical therapy and I agree with this assessment .  She does have a follow-up with Duwaine Pouch next week where hopefully we can try to get another MRI authorization  (denied by the insurance previously) and find out if she is a candidate for an epidural.  Donnamae and I spent a lot of time today talking about next steps and things that we can still doing physical therapy is for strength and flexibility while relying on other medical management to help with her pain.    OBJECTIVE IMPAIRMENTS: Abnormal gait, cardiopulmonary status limiting activity, decreased activity tolerance, decreased endurance, decreased knowledge of condition, difficulty walking, decreased ROM, decreased strength, decreased safety awareness, impaired perceived functional ability, increased muscle spasms, impaired flexibility, improper body mechanics, postural dysfunction, obesity, and pain.   ACTIVITY LIMITATIONS: carrying, lifting, bending, standing, sleeping, stairs, bed mobility, and locomotion level  PARTICIPATION LIMITATIONS: meal prep, cleaning, laundry, driving, shopping, community activity, and yard work  PERSONAL FACTORS: Anxiety, compressed vertebrae, hypothyroid, hypertension, pulmonary embolism, heart failure are also affecting patient's functional outcome.   REHAB POTENTIAL: Good  CLINICAL DECISION MAKING:  Evolving/moderate complexity  EVALUATION COMPLEXITY: Moderate   GOALS: Goals reviewed with patient? Yes  SHORT TERM GOALS: Target date: 04/15/2024  Nariya will be independent with her day 1 home exercise program Baseline: Started 03/18/2024 Goal status: Met 04/07/2024  2.  Improve lumbar extension AROM to neutral Baseline: -20 degrees Goal status: Met 04/05/2024  3.  Improve bilateral lower extremity flexibility for hip flexors to 90 degrees; hamstrings to 50 degrees and bilateral hip external rotation to 25 degrees Baseline: 80/80; 40/45 and 14/17 respectively Goal status: Met 04/15/2024   LONG TERM GOALS: Target date: 05/13/2024  Improve patient-specific functional scale to at least 4 Baseline: 1 Goal status: on going 04/13/2024  2.  Lavelle will report low back and left hip pain consistently 0-3/10 on the numeric pain rating scale Baseline: 6/10 with left radicular symptoms as distal as the foot Goal status: on going 04/21/2024  3.  Janett will be able to stand and walk for at least 10 minutes without being limited by low back, hip radicular symptoms Baseline: Unable at evaluation Goal status: on going 04/21/2024  4.  Sylvana will not require an assistive device for ambulation Baseline: Wheeled walker at evaluation Goal status: on going 04/21/2024  5.  Rocklyn will be independent with her long-term home exercise program at discharge Baseline: Started 03/18/2024 Goal status: on  going 04/21/2024  PLAN:  PT FREQUENCY: 2x/week  PT DURATION: 4 additional weeks  PLANNED INTERVENTIONS: 97750- Physical Performance Testing, 97110-Therapeutic exercises, 97530- Therapeutic activity, 97112- Neuromuscular re-education, 97535- Self Care, [id number]- Manual therapy, [id number]- Gait training, [phone]- Electrical stimulation (unattended), [phone]- Traction (mechanical), [phone] (1-2 muscles), [id number] (3+ muscles)- Dry Needling, Patient/Family education, Spinal mobilization, Cryotherapy, and Moist  heat.  PLAN FOR NEXT SESSION: Postural, scapular and low back strength work to improve standing and walking endurance.  How did things go over the weekend as today was particularly uncomfortable.  Myer LELON Ivory PT, MPT 04/21/24  4:45 PM

## 2024-04-26 ENCOUNTER — Ambulatory Visit: Admitting: Rehabilitative and Restorative Service Providers"

## 2024-04-26 ENCOUNTER — Ambulatory Visit: Admitting: Physical Medicine and Rehabilitation

## 2024-04-26 ENCOUNTER — Encounter: Payer: Self-pay | Admitting: Physical Medicine and Rehabilitation

## 2024-04-26 ENCOUNTER — Encounter: Payer: Self-pay | Admitting: Rehabilitative and Restorative Service Providers"

## 2024-04-26 DIAGNOSIS — G8929 Other chronic pain: Secondary | ICD-10-CM | POA: Diagnosis not present

## 2024-04-26 DIAGNOSIS — M5416 Radiculopathy, lumbar region: Secondary | ICD-10-CM

## 2024-04-26 DIAGNOSIS — R262 Difficulty in walking, not elsewhere classified: Secondary | ICD-10-CM

## 2024-04-26 DIAGNOSIS — R293 Abnormal posture: Secondary | ICD-10-CM | POA: Diagnosis not present

## 2024-04-26 DIAGNOSIS — M5442 Lumbago with sciatica, left side: Secondary | ICD-10-CM

## 2024-04-26 DIAGNOSIS — R269 Unspecified abnormalities of gait and mobility: Secondary | ICD-10-CM

## 2024-04-26 DIAGNOSIS — M5459 Other low back pain: Secondary | ICD-10-CM | POA: Diagnosis not present

## 2024-04-26 DIAGNOSIS — M6281 Muscle weakness (generalized): Secondary | ICD-10-CM

## 2024-04-26 NOTE — Therapy (Signed)
 OUTPATIENT PHYSICAL THERAPY TREATMENT NOTE   Patient Name: [name] MRN: [id number] DOB:June 02, 1941, 83 y.o., female Today's Date: 04/26/2024  END OF SESSION:  PT End of Session - 04/26/24 0845     Visit Number 10    Number of Visits 16    Date for Recertification  05/13/24    Authorization Type [employer]    Progress Note Due on Visit 16    PT Start Time [id number]    PT Stop Time 0930    PT Time Calculation (min) 46 min    Activity Tolerance Patient limited by pain;Patient tolerated treatment well;No increased pain    Behavior During Therapy WFL for tasks assessed/performed             Past Medical History:  Diagnosis Date   Anxiety    takes Lorazepam  daily as needed   Arthritis    Cancer (HCC)    thyroid    Cataracts, bilateral    immature   Chronic back pain    compressed vertebrae,takes Fosamax weekly   Family history of adverse reaction to anesthesia    granddaughter gets very sick and mean   History of blood transfusion    no abnormal reaction noted   History of bronchitis 05/2015   History of colon polyps    benign   Hyperlipidemia    takes Zetia  daily   Hypertension    takes Diltiazem  and Avapro  daily   Hypothyroidism    takes Synthroid  daily   Ischemic colitis    hx of-many yrs ago   Joint pain    Joint swelling    OSA on CPAP    05-08-12 AHi was 46, RDI  53. titrated to   9 cm water  with 3 cm flex.-nadir 75%   PE (pulmonary embolism)    takes Coumadin  daily   Peripheral edema    Pneumonia 1992   hx of   Sarcoidosis    Shortness of breath    Urinary frequency    Weakness    left leg.Numbness and tingling.Has sciatica   Wheeze    occaionally. Not new   Past Surgical History:  Procedure Laterality Date   ABDOMINAL HYSTERECTOMY     APPENDECTOMY     BREAST BIOPSY     biopsies on right x 2   BREAST CYST ASPIRATION Left    CHOLECYSTECTOMY N/A 11/29/2015   Procedure: LAPAROSCOPIC CHOLECYSTECTOMY;  Surgeon: Jina Nephew, MD;   Location: [facility] OR;  Service: General;  Laterality: N/A;   COLONOSCOPY     DILATION AND CURETTAGE OF UTERUS     LIVER BIOPSY     MELANOMA EXCISION     removed from left leg   scalene surgery  1972   trying to find out what was wrong with her lungs   THYROID  SURGERY     Patient Active Problem List   Diagnosis Date Noted   Heart failure with preserved ejection fraction (HCC) 09/26/2022   Permanent atrial fibrillation (HCC) 09/26/2022   Warfarin anticoagulation 09/18/2022   Sepsis (HCC) 09/18/2022   Sepsis due to cellulitis (HCC) 09/18/2022   Cellulitis of right leg 09/18/2022   Chronic intermittent hypoxia with obstructive sleep apnea 09/12/2021   Severe obstructive sleep apnea-hypopnea syndrome 09/12/2021   Acute medial meniscus tear of right knee 03/31/2018   Acute pain of right knee 01/12/2018   Hypercoagulation syndrome 05/24/2014   Hunter's glossitis 05/24/2014   Sarcoidosis 05/24/2014   Hypoxemia 05/24/2014   Primary hypertension 10/13/2013   Nocturnal  hypoxia 05/19/2013   Obstructive sleep apnea (adult) (pediatric) 05/19/2013   Obesity 05/19/2013   OSA on CPAP    Adenopathy 05/26/2012   Cough 05/20/2012   OSA (obstructive sleep apnea) 05/20/2012   Pulmonary embolism (HCC) 04/29/2012   DVT (deep venous thrombosis) (HCC) 04/29/2012    PCP: Toribio MATSU. Yolande, MD  REFERRING PROVIDER: Duwaine BRAVO. Trudy, NP  REFERRING DIAG: M54.42,M54.41,G89.29 (ICD-10-CM) - Chronic bilateral low back pain with bilateral sciatica M54.16 (ICD-10-CM) - Lumbar radiculopathy R26.9 (ICD-10-CM) - Gait abnormality  Rationale for Evaluation and Treatment: Rehabilitation  THERAPY DIAG:  Abnormal posture  Difficulty in walking, not elsewhere classified  Muscle weakness (generalized)  Radiculopathy, lumbar region  Other low back pain  ONSET DATE: December 03, 2023  SUBJECTIVE:                                                                                                                                                                                            SUBJECTIVE STATEMENT: [name] notes last Friday was a painful day.  She was better over the weekend, but is in pain this morning.  She sees Linda E Williams NP for further medical management at 10:15 this morning.  PERTINENT HISTORY:  Anxiety, compressed vertebrae, hypothyroid, hypertension, pulmonary embolism, heart failure  PAIN:  NPRS scale: Was as high as 8/10 this weekend Pain location: Low back, left gluteals and can be as distal as the left ankle when flared-up Pain description: Leg pain is cramping, low back and gluteal pain is burning Aggravating factors: Cold, wet weather, standing, walking, prolonged sitting  Relieving factors: Change of position, exercises  PRECAUTIONS: Back  RED FLAGS: None   WEIGHT BEARING RESTRICTIONS: No  FALLS:  Has patient fallen in last 6 months? No  LIVING ENVIRONMENT: Lives with: lives with their family and lives with their spouse Lives in: House/apartment Stairs: Uses handrail Has following equipment at home: Environmental Consultant - 2 wheeled  OCCUPATION: Retired  PLOF: Independent  PATIENT GOALS: Decrease the hip and leg pain.    NEXT MD VISIT: NA  OBJECTIVE:  Note: Objective measures were completed at Evaluation unless otherwise noted.  DIAGNOSTIC FINDINGS:  Moderate multilevel degenerative changes  X-rays of the pelvis show degenerative spurring of the lateral  trochanteric region of the left proximal femur.    PATIENT SURVEYS:  PSFS: THE PATIENT SPECIFIC FUNCTIONAL SCALE  Place score of 0-10 (0 = unable to perform activity and 10 = able to perform activity at the same level as before injury or problem)  Activity Date: 03/18/2024    Vacuuming 1    2.  Cooking 1    3.  Driving 1    4.  Yard work 1    Total Score 1      Total Score = Sum of activity scores/number of activities  Minimally Detectable Change: 3 points (for single activity); 2 points (for average  score)  [employer] (nd). The Patient Specific Functional Scale . Retrieved from Skateoasis.com.pt   COGNITION: 03/18/2024 Overall cognitive status: Within functional limits for tasks assessed     SENSATION: 03/18/2024 [name] notes left sided peripheral symptoms, mostly of the hip/gluteal area and distal from the knee to the foot  MUSCLE LENGTH: 04/15/2024  Hamstrings: Right 55 deg; Left 50 deg  04/07/2024  Hamstrings: Right 50 deg; Left 45 deg  03/18/2024 Hamstrings: Right 45 deg; Left 40 deg  POSTURE:  03/18/2024 rounded shoulders, forward head, decreased lumbar lordosis, and flexed trunk   LUMBAR ROM:   AROM Eval 03/18/2024 03/21/2024 03/30/2024 04/05/2024 04/07/2024 04/15/2024  Flexion        Extension -20 25 % without pain complaints.  25% WFL without pain  Repeated x 5 improved to 50% 50% WFL 0 0  Right lateral flexion        Left lateral flexion        Right rotation        Left rotation         (Blank rows = not tested)  LOWER EXTREMITY ROM:      Right 03/18/2024 Passive Left 03/18/2024 Passive Right 03/21/2024 Left 03/21/2024 Left/Right 04/07/2024 Left/Right 04/15/2024  Hip flexion 80 80 PROM in supine 90 PROM in supine 105 90/90 90/90  Hip extension        Hip abduction        Hip adduction        Hip internal rotation 18 18   1/9 12/12  Hip external rotation 17 14   32/32 38/33  Knee flexion        Knee extension        Ankle dorsiflexion        Ankle plantarflexion        Ankle inversion        Ankle eversion         (Blank rows = not tested)  LOWER EXTREMITY MMT:  03/18/2024 Deferred secondary to discomfort in test positions  MMT    Hip flexion    Hip extension    Hip abduction    Hip adduction    Hip internal rotation    Hip external rotation    Knee flexion    Knee extension    Ankle dorsiflexion    Ankle plantarflexion    Ankle inversion    Ankle eversion      (Blank rows = not tested)  GAIT: 03/18/2024 Distance walked: 100 feet Assistive device utilized: Environmental Consultant - 2 wheeled Level of assistance: Modified independence Comments: [name] notes that she is limited to standing and walking for short periods of time, only a few minutes                    TREATMENT          DATE:  04/26/2024 Gluteal stretch (knee to opposite shoulder) with strap 5 x 20 seconds Figure 4 stretch 4 x 20 seconds Standing lumbar extension AROM limited range 5 x 3 seconds Yoga Bridge 2 sets of 10 for 5 seconds Side-lying clams with black Thera-Band resistance 10 x each side and 10 times supine 5 seconds hold in each position  Neuromuscular re-education: Tandem  balance eyes open 4 x 20 seconds (balance and endurance with standing and walking)  Functional Activities: Hip hike at counter top (to get rid of lateral lean, quadratus lumborum and hip abductors strength) 12 x 3 seconds  02464: Reviewed recommendations to try to get authorization for an MRI (denied by insurance previously) and to talk to Linda about a possible epidural injection (if she is a candidate) along with other options for the hip (hip and spine issues); reviewed her updated home exercise program with emphasis on activities completed in the office today.     04/21/2024 Single knee-to-chest stretch with opposite leg straight 4 x 20 seconds Figure 4 stretch 4 x 20 seconds Standing lumbar extension AROM Held today  Yoga Bridge 2 sets of 10 for 5 seconds Side-lying clams with black Thera-Band resistance 10 x each side and 10 times supine (consider staying supine moving forwards secondary to difficulty with lying on the left side)  02464: Reviewed the most recent objective measures; discussed next steps including recommendations to try to get authorization for an MRI (denied by insurance previously) and to talk to [name] about a possible epidural injection (if she is a candidate); reviewed her entire home  exercise program with emphasis on activities completed in the office today.  A large chunk of today's visit with [name] was talking about next steps and things we might be able to do to decrease her pain while still participating in her exercises for long-term strength and flexibility gains.   04/15/2024 Single knee-to-chest stretch with opposite leg straight 4 x 20 seconds Figure 4 stretch 4 x 20 seconds Standing lumbar extension AROM 10 x 3 seconds Yoga Bridge 15 for 5 seconds Side-lying clams with black Thera-Band resistance Home exercise program  Functional Activities: Sit to stand with SBP 2 sets of 5 for postural correction and improving body mechanics Standing heel-to-toe raises 10 x 3 seconds (postural correction to avoid for flexion of the hips) Standing toe raises 10 x 3 seconds (postural correction to avoid forward flexion at the hips) Practical logroll for bed mobility and reviewed the importance of frequent changes of position, particularly to avoid prolonged sitting  02464: Reviewed today's objective measures; reviewed body mechanics including bed mobility, postural cues and made appropriate updates to her home exercise program with emphasis on low back strengthening                                                         PATIENT EDUCATION:  03/18/2024 Education details: See above Person educated: Patient and Spouse Education method: Explanation, Demonstration, Tactile cues, Verbal cues, and Handouts Education comprehension: verbalized understanding, returned demonstration, verbal cues required, tactile cues required, and needs further education  HOME EXERCISE PROGRAM: Access Code: [email or web address] URL: [email or web address] Date: 04/26/2024 Prepared by: Lamar Ivory  Exercises - Single Knee to Chest Stretch  - 2 x daily - 1 x weekly - 1 sets - 5 reps - 20 seconds hold - Supine Figure 4 Piriformis Stretch  - 1 x daily - 7 x weekly - 1 sets - 5 reps - 20 seconds  hold - Clamshell  - 2 x daily - 7 x weekly - 2 sets - 10 reps - 3 seconds hold - Supine Lower Trunk Rotation  - 2 x daily - 1 x weekly - 1 sets -  3-5 reps - 15 hold - Standing Lumbar Extension at Wall - Forearms  - 5 x daily - 7 x weekly - 1 sets - 5 reps - 3 seconds hold - Yoga Bridge  - 2 x daily - 7 x weekly - 2 sets - 10 reps - 5 seconds hold - Heel Toe Raises with Counter Support  - 3 x daily - 7 x weekly - 1 sets - 10 reps - 3 seconds hold - Sit to Stand Without Arm Support  - 3 x daily - 7 x weekly - 1 sets - 5 reps - Supine Gluteus Stretch  - 2 x daily - 7 x weekly - 1 sets - 5 reps - 20 seconds hold - Standing Hip Hiking  - 2-3 x daily - 7 x weekly - 1 sets - 10 reps - 3 seconds hold  ASSESSMENT:  CLINICAL IMPRESSION: Moriah appears to have a hip AND spine issue.  Hip bursitis, tendonitis and possibly a gluteal tear along with some symptoms that appear to have a radicular component.  A previous MRI request was denied by insurance and I agree with [name]'s NP that an MRI is appropriate given the lack of significant progress with conservative care (shots and PT).  Although some objective and functional progress is noted, progress is very slow and additional information (like an MRI) should help give [name] the best chance to meet long-term goals.    OBJECTIVE IMPAIRMENTS: Abnormal gait, cardiopulmonary status limiting activity, decreased activity tolerance, decreased endurance, decreased knowledge of condition, difficulty walking, decreased ROM, decreased strength, decreased safety awareness, impaired perceived functional ability, increased muscle spasms, impaired flexibility, improper body mechanics, postural dysfunction, obesity, and pain.   ACTIVITY LIMITATIONS: carrying, lifting, bending, standing, sleeping, stairs, bed mobility, and locomotion level  PARTICIPATION LIMITATIONS: meal prep, cleaning, laundry, driving, shopping, community activity, and yard work  PERSONAL FACTORS:  Anxiety, compressed vertebrae, hypothyroid, hypertension, pulmonary embolism, heart failure are also affecting patient's functional outcome.   REHAB POTENTIAL: Good  CLINICAL DECISION MAKING: Evolving/moderate complexity  EVALUATION COMPLEXITY: Moderate   GOALS: Goals reviewed with patient? Yes  SHORT TERM GOALS: Target date: 04/15/2024  [name] will be independent with her day 1 home exercise program Baseline: Started 03/18/2024 Goal status: Met 04/07/2024  2.  Improve lumbar extension AROM to neutral Baseline: -20 degrees Goal status: Met 04/05/2024  3.  Improve bilateral lower extremity flexibility for hip flexors to 90 degrees; hamstrings to 50 degrees and bilateral hip external rotation to 25 degrees Baseline: 80/80; 40/45 and 14/17 respectively Goal status: Met 04/15/2024   LONG TERM GOALS: Target date: 05/13/2024  Improve patient-specific functional scale to at least 4 Baseline: 1 Goal status: on going 04/26/2024  2.  [name] will report low back and left hip pain consistently 0-3/10 on the numeric pain rating scale Baseline: 6/10 with left radicular symptoms as distal as the foot Goal status: on going 04/26/2024  3.  [name] will be able to stand and walk for at least 10 minutes without being limited by low back, hip radicular symptoms Baseline: Unable at evaluation Goal status: on going 04/26/2024  4.  [name] will not require an assistive device for ambulation Baseline: Wheeled walker at evaluation Goal status: On occasion, but on going 04/26/2024  5.  [name] will be independent with her long-term home exercise program at discharge Baseline: Started 03/18/2024 Goal status: on going 04/26/2024  PLAN:  PT FREQUENCY: 2x/week  PT DURATION: 4 additional weeks  PLANNED INTERVENTIONS: 97750- Physical Performance Testing,  97110-Therapeutic exercises, 97530- Therapeutic activity, [id number]- Neuromuscular re-education, [id number]- Self Care, [id number]- Manual therapy, [id number]- Gait  training, [phone]- Electrical stimulation (unattended), [phone]- Traction (mechanical), [phone] (1-2 muscles), [phone] (3+ muscles)- Dry Needling, Patient/Family education, Spinal mobilization, Cryotherapy, and Moist heat.  PLAN FOR NEXT SESSION: Postural, scapular, hip abductors and low back strength work to improve standing and walking endurance.  Appropriate spine and hip flexibility.  Update FOTO and long-term goals.  How did things go with her appointment with Linda?  Myer LELON Ivory PT, MPT 04/26/24  9:43 AM

## 2024-04-26 NOTE — Progress Notes (Signed)
 Pain Scale   Average Pain 10 Patient advising she has been to PT however PT didn't help, Patient having lower back pain radiating to left hip area        +Driver, -BT, -Dye Allergies.

## 2024-04-26 NOTE — Progress Notes (Signed)
 [name] - 83 y.o. female MRN [id number]  Date of birth: 08-11-1940  Office Visit Note: Visit Date: 04/26/2024 PCP: [name], MD Referred by: [name], MD  Subjective: Chief Complaint  Patient presents with   Lower Back - Pain   Left Hip - Pain   HPI: [name] is a 83 y.o. female who comes in today for evaluation of chronic, worsening and severe bilateral lower back pain radiating to hips, most severe pain radiates to left buttock and down lateral leg to foot. Also reports intermittent paresthesias to bilateral lower extremities. Pain ongoing for several years, worsens with standing and walking. She describes pain as constant aching sensation, currently rates as 8 out of 10. Some relief of pain with home exercise regimen, rest and use of medications. She recently completed short course of formal physical therapy, minimal relief of pain with these treatments. Recent radiographs of lumbar spine shows moderate multi level degenerative changes. We did place order for lumbar MRI imaging in September, however this request was denied by her insurance. She underwent left greater trochanter injection with Dr. Burnetta on 01/19/2024. This injection did not provide her with significant relief of pain. She is managed by Dr. Jerri from orthopedic standpoint. No history of lumbar surgery/injections. She is currently using walker to assist with ambulation. No recent trauma or falls.   Patients course is complicated by pulmonary embolism, heart failure, atrial fibrillation, OSA and morbid obesity. She is currently taking Coumadin .           Review of Systems  Musculoskeletal:  Positive for back pain.  Neurological:  Negative for tingling, sensory change, focal weakness and weakness.  All other systems reviewed and are negative.  Otherwise per HPI.  Assessment & Plan: Visit Diagnoses:    ICD-10-CM   1. Chronic bilateral low back pain with left-sided sciatica   G89.29 MR LUMBAR SPINE WO CONTRAST   M54.42     2. Lumbar radiculopathy  M54.16 MR LUMBAR SPINE WO CONTRAST    3. Gait abnormality  R26.9 MR LUMBAR SPINE WO CONTRAST       Plan: Findings:  Chronic, worsening and severe bilateral lower back pain radiating to hips, most significant pain is to left hip, buttock and down lateral leg to foot. Patient continues to have severe pain despite good conservative therapies such as formal physical therapy, home exercise regimen, rest and use of medications. Patient has difficulty standing on left leg likely due to pain. No pain with internal/external rotation of left hip today. She does have tenderness upon palpation of left greater trochanter region. I placed order for new lumbar MRI imaging, hopefully her insurance will approve post formal physical therapy. Depending on MRI imaging we discussed possibility of performing lumbar epidural steroid injection. I explained injection procedure to her in detail today. She is concerned that she will need a double dose of steroids. I explained to her that using a double dose of steroid medication does not necessarily make a difference in pain relief and is typically not done in our practice. We will see her back for lumbar MRI review. No red flag symptoms noted upon exam today.     Meds & Orders: No orders of the defined types were placed in this encounter.   Orders Placed This Encounter  Procedures   MR LUMBAR SPINE WO CONTRAST    Follow-up: Return for Lumbar MRI review.   Procedures: No procedures performed      Clinical History:  No specialty comments available.   She reports that she has never smoked. She has never used smokeless tobacco. No results for input(s): HGBA1C, LABURIC in the last 8760 hours.  Objective:  VS:  HT:    WT:   BMI:     BP:   HR: bpm  TEMP: ( )  RESP:  Physical Exam Vitals and nursing note reviewed.  HENT:     Head: Normocephalic and atraumatic.     Right Ear:  External ear normal.     Left Ear: External ear normal.     Nose: Nose normal.     Mouth/Throat:     Mouth: Mucous membranes are moist.  Eyes:     Extraocular Movements: Extraocular movements intact.  Cardiovascular:     Rate and Rhythm: Normal rate.     Pulses: Normal pulses.  Pulmonary:     Effort: Pulmonary effort is normal.  Abdominal:     General: Abdomen is flat. There is no distension.  Musculoskeletal:        General: Tenderness present.     Cervical back: Normal range of motion.     Comments: Patient is slow to rise from seated position to standing. Good lumbar range of motion. No pain noted with facet loading. 5/5 strength noted with bilateral hip flexion, knee flexion/extension, ankle dorsiflexion/plantarflexion and EHL. No clonus noted bilaterally. Pain upon palpation of left greater trochanter region. No pain with internal/external rotation of bilateral hips. Sensation intact bilaterally. Dysesthesias noted to left L5 dermatome. Negative slump test bilaterally. Ambulates with cane, gait slow and unsteady.    Skin:    General: Skin is warm and dry.     Capillary Refill: Capillary refill takes less than 2 seconds.  Neurological:     Mental Status: She is alert and oriented to person, place, and time.     Gait: Gait abnormal.  Psychiatric:        Mood and Affect: Mood normal.        Behavior: Behavior normal.     Ortho Exam  Imaging: No results found.  Past Medical/Family/Surgical/Social History: Medications & Allergies reviewed per EMR, new medications updated. Patient Active Problem List   Diagnosis Date Noted   Heart failure with preserved ejection fraction (HCC) 09/26/2022   Permanent atrial fibrillation (HCC) 09/26/2022   Warfarin anticoagulation 09/18/2022   Sepsis (HCC) 09/18/2022   Sepsis due to cellulitis (HCC) 09/18/2022   Cellulitis of right leg 09/18/2022   Chronic intermittent hypoxia with obstructive sleep apnea 09/12/2021   Severe obstructive  sleep apnea-hypopnea syndrome 09/12/2021   Acute medial meniscus tear of right knee 03/31/2018   Acute pain of right knee 01/12/2018   Hypercoagulation syndrome 05/24/2014   Hunter's glossitis 05/24/2014   Sarcoidosis 05/24/2014   Hypoxemia 05/24/2014   Primary hypertension 10/13/2013   Nocturnal hypoxia 05/19/2013   Obstructive sleep apnea (adult) (pediatric) 05/19/2013   Obesity 05/19/2013   OSA on CPAP    Adenopathy 05/26/2012   Cough 05/20/2012   OSA (obstructive sleep apnea) 05/20/2012   Pulmonary embolism (HCC) 04/29/2012   DVT (deep venous thrombosis) (HCC) 04/29/2012   Past Medical History:  Diagnosis Date   Anxiety    takes Lorazepam  daily as needed   Arthritis    Cancer (HCC)    thyroid    Cataracts, bilateral    immature   Chronic back pain    compressed vertebrae,takes Fosamax weekly   Family history of adverse reaction to anesthesia    granddaughter gets very sick  and mean   History of blood transfusion    no abnormal reaction noted   History of bronchitis 05/2015   History of colon polyps    benign   Hyperlipidemia    takes Zetia  daily   Hypertension    takes Diltiazem  and Avapro  daily   Hypothyroidism    takes Synthroid  daily   Ischemic colitis    hx of-many yrs ago   Joint pain    Joint swelling    OSA on CPAP    05-08-12 AHi was 46, RDI  53. titrated to   9 cm water  with 3 cm flex.-nadir 75%   PE (pulmonary embolism)    takes Coumadin  daily   Peripheral edema    Pneumonia 1992   hx of   Sarcoidosis    Shortness of breath    Urinary frequency    Weakness    left leg.Numbness and tingling.Has sciatica   Wheeze    occaionally. Not new   Family History  Problem Relation Age of Onset   Breast cancer Mother    CVA Mother    Breast cancer Sister    Breast cancer Sister    Heart attack Neg Hx    Sleep apnea Neg Hx    Past Surgical History:  Procedure Laterality Date   ABDOMINAL HYSTERECTOMY     APPENDECTOMY     BREAST BIOPSY      biopsies on right x 2   BREAST CYST ASPIRATION Left    CHOLECYSTECTOMY N/A 11/29/2015   Procedure: LAPAROSCOPIC CHOLECYSTECTOMY;  Surgeon: Jina Nephew, MD;  Location: [facility] OR;  Service: General;  Laterality: N/A;   COLONOSCOPY     DILATION AND CURETTAGE OF UTERUS     LIVER BIOPSY     MELANOMA EXCISION     removed from left leg   scalene surgery  1972   trying to find out what was wrong with her lungs   THYROID  SURGERY     Social History   Occupational History   Occupation: Retired    Associate Professor: RETIRED  Tobacco Use   Smoking status: Never   Smokeless tobacco: Never  Vaping Use   Vaping status: Never Used  Substance and Sexual Activity   Alcohol  use: No   Drug use: No   Sexual activity: Not on file

## 2024-04-27 ENCOUNTER — Encounter: Payer: Self-pay | Admitting: Physical Medicine and Rehabilitation

## 2024-04-28 ENCOUNTER — Encounter: Admitting: Rehabilitative and Restorative Service Providers"

## 2024-05-02 ENCOUNTER — Encounter: Payer: Self-pay | Admitting: Physical Medicine and Rehabilitation

## 2024-05-03 ENCOUNTER — Encounter: Admitting: Rehabilitative and Restorative Service Providers"

## 2024-05-03 ENCOUNTER — Encounter: Payer: Self-pay | Admitting: Physical Medicine and Rehabilitation

## 2024-05-03 ENCOUNTER — Ambulatory Visit
Admission: EM | Admit: 2024-05-03 | Discharge: 2024-05-03 | Disposition: A | Discharge status: Home / Self Care | Attending: Family Medicine | Admitting: Family Medicine

## 2024-05-03 ENCOUNTER — Other Ambulatory Visit: Payer: Self-pay

## 2024-05-03 DIAGNOSIS — L03116 Cellulitis of left lower limb: Secondary | ICD-10-CM

## 2024-05-03 MED ORDER — CEPHALEXIN 500 MG PO CAPS: 500.0000 mg | ORAL_CAPSULE | Freq: Four times a day (QID) | ORAL | 0 refills | Status: DC

## 2024-05-03 MED ORDER — CEFTRIAXONE SODIUM 1 G IJ SOLR
1000.0000 mg | Freq: Once | INTRAMUSCULAR | Status: AC
  Administered 2024-05-03: 1000 mg via INTRAMUSCULAR

## 2024-05-03 NOTE — ED Triage Notes (Addendum)
 Has c/o dermatitis to legs, reports she is having a flare that has been going on several weeks (at least 6). Reports she deals with this intermittently as  a chronic ailment. Redness and swelling to ble. Reports she has had a temperature that has been up and down for about a week. No otc meds.

## 2024-05-03 NOTE — ED Provider Notes (Signed)
 [facility]    CSN: [id number] Arrival date & time: 05/03/24  1202      History   Chief Complaint Chief Complaint  Patient presents with   Leg Pain    HPI RHEDA KASSAB is a 83 y.o. female.   HPI 83 year old female presents with possible cellulitis of legs.  PMH significant for permanent A-fib, DVT, and PE.  Patient is accompanied by her husband today.  Past Medical History:  Diagnosis Date   Anxiety    takes Lorazepam  daily as needed   Arthritis    Cancer (HCC)    thyroid    Cataracts, bilateral    immature   Chronic back pain    compressed vertebrae,takes Fosamax weekly   Family history of adverse reaction to anesthesia    granddaughter gets very sick and mean   History of blood transfusion    no abnormal reaction noted   History of bronchitis 05/2015   History of colon polyps    benign   Hyperlipidemia    takes Zetia  daily   Hypertension    takes Diltiazem  and Avapro  daily   Hypothyroidism    takes Synthroid  daily   Ischemic colitis    hx of-many yrs ago   Joint pain    Joint swelling    OSA on CPAP    05-08-12 AHi was 46, RDI  53. titrated to   9 cm water  with 3 cm flex.-nadir 75%   PE (pulmonary embolism)    takes Coumadin  daily   Peripheral edema    Pneumonia 1992   hx of   Sarcoidosis    Shortness of breath    Urinary frequency    Weakness    left leg.Numbness and tingling.Has sciatica   Wheeze    occaionally. Not new    Patient Active Problem List   Diagnosis Date Noted   Heart failure with preserved ejection fraction (HCC) 09/26/2022   Permanent atrial fibrillation (HCC) 09/26/2022   Warfarin anticoagulation 09/18/2022   Sepsis (HCC) 09/18/2022   Sepsis due to cellulitis (HCC) 09/18/2022   Cellulitis of right leg 09/18/2022   Chronic intermittent hypoxia with obstructive sleep apnea 09/12/2021   Severe obstructive sleep apnea-hypopnea syndrome 09/12/2021   Acute medial meniscus tear of right knee 03/31/2018    Acute pain of right knee 01/12/2018   Hypercoagulation syndrome 05/24/2014   Hunter's glossitis 05/24/2014   Sarcoidosis 05/24/2014   Hypoxemia 05/24/2014   Primary hypertension 10/13/2013   Nocturnal hypoxia 05/19/2013   Obstructive sleep apnea (adult) (pediatric) 05/19/2013   Obesity 05/19/2013   OSA on CPAP    Adenopathy 05/26/2012   Cough 05/20/2012   OSA (obstructive sleep apnea) 05/20/2012   Pulmonary embolism (HCC) 04/29/2012   DVT (deep venous thrombosis) (HCC) 04/29/2012    Past Surgical History:  Procedure Laterality Date   ABDOMINAL HYSTERECTOMY     APPENDECTOMY     BREAST BIOPSY     biopsies on right x 2   BREAST CYST ASPIRATION Left    CHOLECYSTECTOMY N/A 11/29/2015   Procedure: LAPAROSCOPIC CHOLECYSTECTOMY;  Surgeon: Jina Nephew, MD;  Location: [facility] OR;  Service: General;  Laterality: N/A;   COLONOSCOPY     DILATION AND CURETTAGE OF UTERUS     LIVER BIOPSY     MELANOMA EXCISION     removed from left leg   scalene surgery  1972   trying to find out what was wrong with her lungs   THYROID  SURGERY  OB History   No obstetric history on file.      Home Medications    Prior to Admission medications   Medication Sig Start Date End Date Taking? Authorizing Provider  cephALEXin  (KEFLEX ) 500 MG capsule Take 1 capsule (500 mg total) by mouth 4 (four) times daily for 10 days. 05/03/24 05/13/24 Yes Teddy Sharper, FNP  acetaminophen  (TYLENOL ) 325 MG tablet Take 325-650 mg by mouth 2 (two) times daily as needed for mild pain or headache.    [provider]  Carboxymethylcellulose Sodium (REFRESH LIQUIGEL OP) Place 1 drop into both eyes at bedtime.    [provider]  diltiazem  (TIAZAC ) 360 MG 24 hr capsule Take 360 mg by mouth at bedtime.    [provider]  furosemide  (LASIX ) 20 MG tablet Take 2 tablets (40 mg total) by mouth daily. 12/31/23   Tolia, Sunit, DO  irbesartan  (AVAPRO ) 300 MG tablet Take 1 tablet (300 mg total) by mouth  daily. 09/27/22   Sherrill Cable Latif, DO  levothyroxine  (SYNTHROID , LEVOTHROID) 100 MCG tablet Take 100 mcg by mouth daily before breakfast.  10/07/14   [provider]  LORazepam  (ATIVAN ) 1 MG tablet Take 1.5 mg by mouth See admin instructions. Take 1.5 mg by mouth at bedtime and an additional 1-1.5mg  once a day as needed for anxiety    [provider]  Lutein-Zeaxanthin 25-5 MG CAPS Take 1 capsule by mouth daily with breakfast.    [provider]  nebivolol  (BYSTOLIC ) 5 MG tablet Take 1 tablet (5 mg total) by mouth daily. 09/27/22   Sheikh, Omair Latif, DO  nystatin  (MYCOSTATIN /NYSTOP ) powder Apply 1 Application topically 3 (three) times daily. 10/08/22   Lucien Orren SAILOR, PA-C  OXYGEN  Inhale 3.5 L/min into the lungs at bedtime.    [provider]  Potassium Chloride  ER 20 MEQ TBCR Take 1 tablet by mouth daily. 10/06/22   [provider]  PRESCRIPTION MEDICATION CPAP- At bedtime    [provider]  SIMPLY SALINE NA Place 1 spray into both nostrils as needed (for dryness).    [provider]  triamcinolone cream (KENALOG) 0.1 % Apply 1 Application topically See admin instructions. Apply to both legs 2 times a day 04/05/21   [provider]  warfarin (COUMADIN ) 5 MG tablet Take 5 mg by mouth at bedtime. 04/25/15   [provider]    Family History Family History  Problem Relation Age of Onset   Breast cancer Mother    CVA Mother    Breast cancer Sister    Breast cancer Sister    Heart attack Neg Hx    Sleep apnea Neg Hx     Social History Social History   Tobacco Use   Smoking status: Never   Smokeless tobacco: Never  Vaping Use   Vaping status: Never Used  Substance Use Topics   Alcohol  use: No   Drug use: No     Allergies   Codeine, Erythromycin, Tetracyclines & related, Statins, Atenolol, Citalopram hydrobromide, Clindamycin /lincomycin, Coenzyme q10, Losartan potassium, Motrin [ibuprofen],  Rosuvastatin, Simvastatin, Tape, and Latex   Review of Systems Review of Systems  Skin:  Positive for rash.     Physical Exam Triage Vital Signs ED Triage Vitals  Encounter Vitals Group     BP      Girls Systolic BP Percentile      Girls Diastolic BP Percentile      Boys Systolic BP Percentile      Boys Diastolic BP  Percentile      Pulse      Resp      Temp      Temp src      SpO2      Weight      Height      Head Circumference      Peak Flow      Pain Score      Pain Loc      Pain Education      Exclude from Growth Chart    No data found.  Updated Vital Signs BP (!) 155/87   Pulse 85   Temp 97.9 F (36.6 C) (Oral)   Resp 16   SpO2 95%    Physical Exam Vitals and nursing note reviewed.  Constitutional:      Appearance: Normal appearance. She is obese.  HENT:     Head: Normocephalic and atraumatic.     Mouth/Throat:     Mouth: Mucous membranes are moist.     Pharynx: Oropharynx is clear.  Eyes:     Extraocular Movements: Extraocular movements intact.     Conjunctiva/sclera: Conjunctivae normal.     Pupils: Pupils are equal, round, and reactive to light.  Cardiovascular:     Rate and Rhythm: Normal rate and regular rhythm.     Heart sounds: Normal heart sounds.  Pulmonary:     Effort: Pulmonary effort is normal.     Breath sounds: Normal breath sounds. No wheezing, rhonchi or rales.  Musculoskeletal:        General: Normal range of motion.  Skin:    General: Skin is warm and dry.     Comments: Left lower leg: Erythematous maculopapular eruption with weeping serous drainage please see image below  Neurological:     General: No focal deficit present.     Mental Status: She is alert and oriented to person, place, and time. Mental status is at baseline.  Psychiatric:        Mood and Affect: Mood normal.        Behavior: Behavior normal.      UC Treatments / Results  Labs (all labs ordered are listed, but only abnormal results are  displayed) Labs Reviewed - No data to display  EKG   Radiology No results found.  Procedures Procedures (including critical care time)  Medications Ordered in UC Medications  cefTRIAXone  (ROCEPHIN ) injection 1,000 mg (1,000 mg Intramuscular Given 05/03/24 1240)    Initial Impression / Assessment and Plan / UC Course  I have reviewed the triage vital signs and the nursing notes.  Pertinent labs & imaging results that were available during my care of the patient were reviewed by me and considered in my medical decision making (see chart for details).     MDM: Cellulitis of leg, left-IM Rocephin  given 1 g in clinic prior to discharge, Rx'd cephalexin  500 mg capsule: Take 1 capsule 4 times daily for the next 10 days. Advised patient take medication as directed with food to completion.  Encouraged to increase daily water intake to 64 ounces per day while taking this medication.  Advised if symptoms worsen and/or unresolved please follow-up with your PCP or here for further evaluation.  Patient discharged home, hemodynamically stable. Final Clinical Impressions(s) / UC Diagnoses   Final diagnoses:  Cellulitis of leg, left     Discharge Instructions      Advised patient take medication as directed with food to completion.  Encouraged to increase daily water intake to 64  ounces per day while taking this medication.  Advised if symptoms worsen and/or unresolved please follow-up with your PCP or here for further evaluation.     ED Prescriptions     Medication Sig Dispense Auth. Provider   cephALEXin  (KEFLEX ) 500 MG capsule Take 1 capsule (500 mg total) by mouth 4 (four) times daily for 10 days. 40 capsule Athziri Freundlich, FNP      PDMP not reviewed this encounter.   Teddy Sharper, FNP 05/03/24 1255

## 2024-05-03 NOTE — Discharge Instructions (Addendum)
 Advised patient take medication as directed with food to completion.  Encouraged to increase daily water intake to 64 ounces per day while taking this medication.  Advised if symptoms worsen and/or unresolved please follow-up with your PCP or here for further evaluation.

## 2024-05-04 ENCOUNTER — Encounter: Admitting: Rehabilitative and Restorative Service Providers"

## 2024-05-04 ENCOUNTER — Encounter: Payer: Self-pay | Admitting: Physical Medicine and Rehabilitation

## 2024-05-04 DIAGNOSIS — Z86711 Personal history of pulmonary embolism: Secondary | ICD-10-CM | POA: Diagnosis not present

## 2024-05-04 DIAGNOSIS — Z7901 Long term (current) use of anticoagulants: Secondary | ICD-10-CM | POA: Diagnosis not present

## 2024-05-04 DIAGNOSIS — I80209 Phlebitis and thrombophlebitis of unspecified deep vessels of unspecified lower extremity: Secondary | ICD-10-CM | POA: Diagnosis not present

## 2024-05-08 ENCOUNTER — Other Ambulatory Visit: Payer: Self-pay

## 2024-05-08 ENCOUNTER — Encounter: Payer: Self-pay | Admitting: Emergency Medicine

## 2024-05-08 ENCOUNTER — Ambulatory Visit
Admission: EM | Admit: 2024-05-08 | Discharge: 2024-05-08 | Disposition: A | Discharge status: Home / Self Care | Attending: Internal Medicine | Admitting: Internal Medicine

## 2024-05-08 DIAGNOSIS — L02419 Cutaneous abscess of limb, unspecified: Secondary | ICD-10-CM

## 2024-05-08 DIAGNOSIS — L03119 Cellulitis of unspecified part of limb: Secondary | ICD-10-CM

## 2024-05-08 MED ORDER — SULFAMETHOXAZOLE-TRIMETHOPRIM 800-160 MG PO TABS: 1.0000 | ORAL_TABLET | Freq: Two times a day (BID) | ORAL | 0 refills | Status: DC

## 2024-05-08 NOTE — ED Triage Notes (Signed)
 Patient presents to Urgent Care with complaints of recheck of cellulitis of legs. Was seen on 05/03/24 and given injection of Rocephin  in office and rx for Cephalexin  four times daily. Patient reports says the weeping has improved in legs. She is using topical and ointment on the legs but is having burning and ulcer sensation.

## 2024-05-08 NOTE — Discharge Instructions (Signed)
 Continue cephalexin . Start bactrim . Blood work pending. We will call if it is abnormal. Follow up with PCP. Go to the ER if you do not see any improvement.

## 2024-05-08 NOTE — ED Provider Notes (Signed)
 [facility]    CSN: [id number] Arrival date & time: 05/08/24  1124      History   Chief Complaint Chief Complaint  Patient presents with   Recurrent Skin Infections    HPI [name] is a 83 y.o. female.   Patient presents today for reevaluation of cellulitis to bilateral lower extremities.  Patient was seen on 11/25.  She was given IM Rocephin  and discharged with cephalexin .  She has been taking cephalexin  as prescribed and reports some improvement and weeping.  Although, there has not been a major change in redness and swelling.  Reports Tmax at home was 99.6.  She does have a history of recurrent leg cellulitis.  She had a follow-up appointment with her PCP on 11/26 where she had her INR checked, but reports they did not change any treatment for her cellulitis.     Past Medical History:  Diagnosis Date   Anxiety    takes Lorazepam  daily as needed   Arthritis    Cancer (HCC)    thyroid    Cataracts, bilateral    immature   Chronic back pain    compressed vertebrae,takes Fosamax weekly   Family history of adverse reaction to anesthesia    granddaughter gets very sick and mean   History of blood transfusion    no abnormal reaction noted   History of bronchitis 05/2015   History of colon polyps    benign   Hyperlipidemia    takes Zetia  daily   Hypertension    takes Diltiazem  and Avapro  daily   Hypothyroidism    takes Synthroid  daily   Ischemic colitis    hx of-many yrs ago   Joint pain    Joint swelling    OSA on CPAP    05-08-12 AHi was 46, RDI  53. titrated to   9 cm water  with 3 cm flex.-nadir 75%   PE (pulmonary embolism)    takes Coumadin  daily   Peripheral edema    Pneumonia 1992   hx of   Sarcoidosis    Shortness of breath    Urinary frequency    Weakness    left leg.Numbness and tingling.Has sciatica   Wheeze    occaionally. Not new    Patient Active Problem List   Diagnosis Date Noted   Heart failure with preserved  ejection fraction (HCC) 09/26/2022   Permanent atrial fibrillation (HCC) 09/26/2022   Warfarin anticoagulation 09/18/2022   Sepsis (HCC) 09/18/2022   Sepsis due to cellulitis (HCC) 09/18/2022   Cellulitis of right leg 09/18/2022   Chronic intermittent hypoxia with obstructive sleep apnea 09/12/2021   Severe obstructive sleep apnea-hypopnea syndrome 09/12/2021   Acute medial meniscus tear of right knee 03/31/2018   Acute pain of right knee 01/12/2018   Hypercoagulation syndrome 05/24/2014   Hunter's glossitis 05/24/2014   Sarcoidosis 05/24/2014   Hypoxemia 05/24/2014   Primary hypertension 10/13/2013   Nocturnal hypoxia 05/19/2013   Obstructive sleep apnea (adult) (pediatric) 05/19/2013   Obesity 05/19/2013   OSA on CPAP    Adenopathy 05/26/2012   Cough 05/20/2012   OSA (obstructive sleep apnea) 05/20/2012   Pulmonary embolism (HCC) 04/29/2012   DVT (deep venous thrombosis) (HCC) 04/29/2012    Past Surgical History:  Procedure Laterality Date   ABDOMINAL HYSTERECTOMY     APPENDECTOMY     BREAST BIOPSY     biopsies on right x 2   BREAST CYST ASPIRATION Left    CHOLECYSTECTOMY N/A 11/29/2015  Procedure: LAPAROSCOPIC CHOLECYSTECTOMY;  Surgeon: Jina Nephew, MD;  Location: [facility] OR;  Service: General;  Laterality: N/A;   COLONOSCOPY     DILATION AND CURETTAGE OF UTERUS     LIVER BIOPSY     MELANOMA EXCISION     removed from left leg   scalene surgery  1972   trying to find out what was wrong with her lungs   THYROID  SURGERY      OB History   No obstetric history on file.      Home Medications    Prior to Admission medications   Medication Sig Start Date End Date Taking? Authorizing Provider  acetaminophen  (TYLENOL ) 325 MG tablet Take 325-650 mg by mouth 2 (two) times daily as needed for mild pain or headache.   Yes [provider]  Carboxymethylcellulose Sodium (REFRESH LIQUIGEL OP) Place 1 drop into both eyes at bedtime.   Yes [provider]   cephALEXin  (KEFLEX ) 500 MG capsule Take 1 capsule (500 mg total) by mouth 4 (four) times daily for 10 days. 05/03/24 05/13/24 Yes Teddy Sharper, FNP  diltiazem  (TIAZAC ) 360 MG 24 hr capsule Take 360 mg by mouth at bedtime.   Yes [provider]  furosemide  (LASIX ) 20 MG tablet Take 2 tablets (40 mg total) by mouth daily. 12/31/23  Yes Tolia, Sunit, DO  irbesartan  (AVAPRO ) 300 MG tablet Take 1 tablet (300 mg total) by mouth daily. 09/27/22  Yes Sheikh, Omair Latif, DO  levothyroxine  (SYNTHROID , LEVOTHROID) 100 MCG tablet Take 100 mcg by mouth daily before breakfast.  10/07/14  Yes [provider]  LORazepam  (ATIVAN ) 1 MG tablet Take 1.5 mg by mouth See admin instructions. Take 1.5 mg by mouth at bedtime and an additional 1-1.5mg  once a day as needed for anxiety   Yes [provider]  Lutein-Zeaxanthin 25-5 MG CAPS Take 1 capsule by mouth daily with breakfast.   Yes [provider]  nebivolol  (BYSTOLIC ) 5 MG tablet Take 1 tablet (5 mg total) by mouth daily. 09/27/22  Yes Sheikh, Omair Latif, DO  nystatin  (MYCOSTATIN /NYSTOP ) powder Apply 1 Application topically 3 (three) times daily. 10/08/22  Yes Conte, Tessa N, PA-C  OXYGEN  Inhale 3.5 L/min into the lungs at bedtime.   Yes [provider]  Potassium Chloride  ER 20 MEQ TBCR Take 1 tablet by mouth daily. 10/06/22  Yes [provider]  PRESCRIPTION MEDICATION CPAP- At bedtime   Yes [provider]  SIMPLY SALINE NA Place 1 spray into both nostrils as needed (for dryness).   Yes [provider]  sulfamethoxazole -trimethoprim (BACTRIM DS) 800-160 MG tablet Take 1 tablet by mouth 2 (two) times daily for 7 days. 05/08/24 05/15/24 Yes Annice Jolly, Darryle BRAVO, FNP  triamcinolone cream (KENALOG) 0.1 % Apply 1 Application topically See admin instructions. Apply to both legs 2 times a day 04/05/21  Yes [provider]  warfarin (COUMADIN ) 5 MG tablet Take 5 mg by mouth at bedtime. 04/25/15  Yes  [provider]    Family History Family History  Problem Relation Age of Onset   Breast cancer Mother    CVA Mother    Breast cancer Sister    Breast cancer Sister    Heart attack Neg Hx    Sleep apnea Neg Hx     Social History Social History   Tobacco Use   Smoking status: Never   Smokeless tobacco: Never  Vaping Use   Vaping status: Never Used  Substance Use Topics   Alcohol  use: No  Drug use: No     Allergies   Codeine, Erythromycin, Tetracyclines & related, Statins, Atenolol, Citalopram hydrobromide, Clindamycin /lincomycin, Coenzyme q10, Losartan potassium, Motrin [ibuprofen], Rosuvastatin, Simvastatin, Tape, and Latex   Review of Systems Review of Systems Per HPI  Physical Exam Triage Vital Signs ED Triage Vitals [05/08/24 1211]  Encounter Vitals Group     BP (!) 159/82     Girls Systolic BP Percentile      Girls Diastolic BP Percentile      Boys Systolic BP Percentile      Boys Diastolic BP Percentile      Pulse Rate 76     Resp 16     Temp 98.1 F (36.7 C)     Temp Source Oral     SpO2 95 %     Weight      Height      Head Circumference      Peak Flow      Pain Score 4     Pain Loc      Pain Education      Exclude from Growth Chart    No data found.  Updated Vital Signs BP (!) 159/82 (BP Location: Left Arm)   Pulse 76   Temp 98.1 F (36.7 C) (Oral)   Resp 16   SpO2 95%   Visual Acuity Right Eye Distance:   Left Eye Distance:   Bilateral Distance:    Right Eye Near:   Left Eye Near:    Bilateral Near:     Physical Exam Constitutional:      General: She is not in acute distress.    Appearance: Normal appearance. She is not toxic-appearing or diaphoretic.  HENT:     Head: Normocephalic and atraumatic.  Eyes:     Extraocular Movements: Extraocular movements intact.     Conjunctiva/sclera: Conjunctivae normal.  Pulmonary:     Effort: Pulmonary effort is normal.  Skin:    Comments: Erythema and 2+ pitting edema  noted to bilateral lower extremities that is circumferential.  Patient does have approximately 2 cm irregular shaped superficial ulcerated area to bilateral lateral legs.  No bleeding noted.  Mild weeping noted.  Neurovascularly intact at baseline.  Neurological:     General: No focal deficit present.     Mental Status: She is alert and oriented to person, place, and time. Mental status is at baseline.  Psychiatric:        Mood and Affect: Mood normal.        Behavior: Behavior normal.        Thought Content: Thought content normal.        Judgment: Judgment normal.      UC Treatments / Results  Labs (all labs ordered are listed, but only abnormal results are displayed) Labs Reviewed  BASIC METABOLIC PANEL WITH GFR  CBC    EKG   Radiology No results found.  Procedures Procedures (including critical care time)  Medications Ordered in UC Medications - No data to display  Initial Impression / Assessment and Plan / UC Course  I have reviewed the triage vital signs and the nursing notes.  Pertinent labs & imaging results that were available during my care of the patient were reviewed by me and considered in my medical decision making (see chart for details).     Patient presenting with persistent bilateral leg cellulitis.  Patient reports some improvement with IM Rocephin  and cephalexin .  Advised patient to continue cephalexin .  Will add Bactrim .  Creatinine clearance is 87 so no dosage adjustment necessary.  BMP and CBC pending.  Discussed with patient that if she does not see any improvement or if it worsens, she is to go to the emergency department as she may need IV antibiotics.  Advised PCP follow-up as soon as possible as well given that she takes warfarin as she needs to have her INR checked given she is taking antibiotics.  Advised strict return and ER precautions.  Patient verbalized understanding and was agreeable with plan. Final Clinical Impressions(s) / UC Diagnoses    Final diagnoses:  Cellulitis and abscess of leg     Discharge Instructions      Continue cephalexin . Start bactrim. Blood work pending. We will call if it is abnormal. Follow up with PCP. Go to the ER if you do not see any improvement.     ED Prescriptions     Medication Sig Dispense Auth. Provider   sulfamethoxazole -trimethoprim (BACTRIM DS) 800-160 MG tablet Take 1 tablet by mouth 2 (two) times daily for 7 days. 14 tablet [place], Lucan Riner E, [place]      PDMP not reviewed this encounter.   Hazen Darryle BRAVO, [place] 05/08/24 1322

## 2024-05-09 ENCOUNTER — Other Ambulatory Visit

## 2024-05-10 ENCOUNTER — Encounter: Admitting: Rehabilitative and Restorative Service Providers"

## 2024-05-10 LAB — CBC WITH DIFFERENTIAL/PLATELET
Basophils Absolute: 0.1 x10E3/uL (ref 0.0–0.2)
Basos: 1 %
EOS (ABSOLUTE): 0.3 x10E3/uL (ref 0.0–0.4)
Eos: 3 %
Hematocrit: 42 % (ref 34.0–46.6)
Hemoglobin: 13.3 g/dL (ref 11.1–15.9)
Immature Grans (Abs): 0 x10E3/uL (ref 0.0–0.1)
Immature Granulocytes: 0 %
Lymphocytes Absolute: 1.1 x10E3/uL (ref 0.7–3.1)
Lymphs: 11 %
MCH: 29.6 pg (ref 26.6–33.0)
MCHC: 31.7 g/dL (ref 31.5–35.7)
MCV: 94 fL (ref 79–97)
Monocytes Absolute: 1.4 x10E3/uL — ABNORMAL HIGH (ref 0.1–0.9)
Monocytes: 14 %
Neutrophils Absolute: 7.3 x10E3/uL — ABNORMAL HIGH (ref 1.4–7.0)
Neutrophils: 71 %
Platelets: 223 x10E3/uL (ref 150–450)
RBC: 4.49 x10E6/uL (ref 3.77–5.28)
RDW: 13.4 % (ref 11.7–15.4)
WBC: 10.2 x10E3/uL (ref 3.4–10.8)

## 2024-05-10 LAB — SPECIMEN STATUS REPORT

## 2024-05-10 LAB — BASIC METABOLIC PANEL WITH GFR
BUN/Creatinine Ratio: 17 (ref 12–28)
BUN: 13 mg/dL (ref 8–27)
CO2: 21 mmol/L (ref 20–29)
Calcium: 9.2 mg/dL (ref 8.7–10.3)
Chloride: 100 mmol/L (ref 96–106)
Creatinine, Ser: 0.75 mg/dL (ref 0.57–1.00)
Glucose: 121 mg/dL — ABNORMAL HIGH (ref 70–99)
Potassium: 4.6 mmol/L (ref 3.5–5.2)
Sodium: 138 mmol/L (ref 134–144)
eGFR: 78 mL/min/1.73 (ref >59)

## 2024-05-11 ENCOUNTER — Ambulatory Visit (HOSPITAL_COMMUNITY): Payer: Self-pay

## 2024-05-12 ENCOUNTER — Encounter: Admitting: Rehabilitative and Restorative Service Providers"

## 2024-05-12 ENCOUNTER — Emergency Department (HOSPITAL_BASED_OUTPATIENT_CLINIC_OR_DEPARTMENT_OTHER)

## 2024-05-12 ENCOUNTER — Other Ambulatory Visit: Payer: Self-pay

## 2024-05-12 ENCOUNTER — Inpatient Hospital Stay (HOSPITAL_BASED_OUTPATIENT_CLINIC_OR_DEPARTMENT_OTHER)
Admission: EM | Admit: 2024-05-12 | Discharge: 2024-05-16 | DRG: 264 | Disposition: A | Attending: Internal Medicine | Admitting: Internal Medicine

## 2024-05-12 ENCOUNTER — Encounter (HOSPITAL_BASED_OUTPATIENT_CLINIC_OR_DEPARTMENT_OTHER): Payer: Self-pay | Admitting: Emergency Medicine

## 2024-05-12 DIAGNOSIS — R591 Generalized enlarged lymph nodes: Secondary | ICD-10-CM | POA: Diagnosis present

## 2024-05-12 DIAGNOSIS — R0902 Hypoxemia: Secondary | ICD-10-CM

## 2024-05-12 DIAGNOSIS — G4733 Obstructive sleep apnea (adult) (pediatric): Secondary | ICD-10-CM | POA: Diagnosis present

## 2024-05-12 DIAGNOSIS — I4891 Unspecified atrial fibrillation: Secondary | ICD-10-CM | POA: Diagnosis present

## 2024-05-12 DIAGNOSIS — I5033 Acute on chronic diastolic (congestive) heart failure: Secondary | ICD-10-CM | POA: Diagnosis present

## 2024-05-12 DIAGNOSIS — R079 Chest pain, unspecified: Principal | ICD-10-CM

## 2024-05-12 DIAGNOSIS — Z7901 Long term (current) use of anticoagulants: Secondary | ICD-10-CM

## 2024-05-12 DIAGNOSIS — L03119 Cellulitis of unspecified part of limb: Secondary | ICD-10-CM | POA: Diagnosis not present

## 2024-05-12 DIAGNOSIS — I82409 Acute embolism and thrombosis of unspecified deep veins of unspecified lower extremity: Secondary | ICD-10-CM | POA: Diagnosis present

## 2024-05-12 DIAGNOSIS — I2699 Other pulmonary embolism without acute cor pulmonale: Secondary | ICD-10-CM | POA: Diagnosis present

## 2024-05-12 DIAGNOSIS — I2510 Atherosclerotic heart disease of native coronary artery without angina pectoris: Secondary | ICD-10-CM | POA: Diagnosis not present

## 2024-05-12 DIAGNOSIS — R599 Enlarged lymph nodes, unspecified: Secondary | ICD-10-CM

## 2024-05-12 DIAGNOSIS — I4821 Permanent atrial fibrillation: Secondary | ICD-10-CM | POA: Diagnosis present

## 2024-05-12 DIAGNOSIS — R0789 Other chest pain: Secondary | ICD-10-CM | POA: Diagnosis present

## 2024-05-12 DIAGNOSIS — I771 Stricture of artery: Secondary | ICD-10-CM | POA: Diagnosis not present

## 2024-05-12 DIAGNOSIS — I700 Atherosclerosis of aorta: Secondary | ICD-10-CM | POA: Diagnosis not present

## 2024-05-12 LAB — BASIC METABOLIC PANEL WITH GFR
Anion gap: 11 (ref 5–15)
BUN: 16 mg/dL (ref 8–23)
CO2: 26 mmol/L (ref 22–32)
Calcium: 9.6 mg/dL (ref 8.9–10.3)
Chloride: 97 mmol/L — ABNORMAL LOW (ref 98–111)
Creatinine, Ser: 0.9 mg/dL (ref 0.44–1.00)
GFR, Estimated: >60 mL/min (ref >60)
Glucose, Bld: 183 mg/dL — ABNORMAL HIGH (ref 70–99)
Potassium: 4.2 mmol/L (ref 3.5–5.1)
Sodium: 134 mmol/L — ABNORMAL LOW (ref 135–145)

## 2024-05-12 LAB — CBC
HCT: 39.2 % (ref 36.0–46.0)
Hemoglobin: 12.7 g/dL (ref 12.0–15.0)
MCH: 29.3 pg (ref 26.0–34.0)
MCHC: 32.4 g/dL (ref 30.0–36.0)
MCV: 90.5 fL (ref 80.0–100.0)
Platelets: 271 K/uL (ref 150–400)
RBC: 4.33 MIL/uL (ref 3.87–5.11)
RDW: 14.1 % (ref 11.5–15.5)
WBC: 11.2 K/uL — ABNORMAL HIGH (ref 4.0–10.5)
nRBC: 0 % (ref 0.0–0.2)

## 2024-05-12 LAB — PROTIME-INR
INR: 3.6 — ABNORMAL HIGH (ref 0.8–1.2)
Prothrombin Time: 37.2 s — ABNORMAL HIGH (ref 11.4–15.2)

## 2024-05-12 LAB — PRO BRAIN NATRIURETIC PEPTIDE: Pro Brain Natriuretic Peptide: 1710 pg/mL — ABNORMAL HIGH (ref <300.0)

## 2024-05-12 LAB — RESP PANEL BY RT-PCR (RSV, FLU A&B, COVID)  RVPGX2
Influenza A by PCR: NEGATIVE
Influenza B by PCR: NEGATIVE
Resp Syncytial Virus by PCR: NEGATIVE
SARS Coronavirus 2 by RT PCR: NEGATIVE

## 2024-05-12 LAB — TROPONIN T, HIGH SENSITIVITY
Troponin T High Sensitivity: 22 ng/L — ABNORMAL HIGH (ref 0–19)
Troponin T High Sensitivity: 21 ng/L — ABNORMAL HIGH (ref 0–19)

## 2024-05-12 MED ORDER — ACETAMINOPHEN 650 MG RE SUPP: 650.0000 mg | Freq: Four times a day (QID) | RECTAL | Status: DC | PRN

## 2024-05-12 MED ORDER — NEBIVOLOL HCL 5 MG PO TABS
5.0000 mg | ORAL_TABLET | Freq: Every day | ORAL | Status: DC
  Administered 2024-05-12 – 2024-05-16 (×5): 5 mg via ORAL
  Filled 2024-05-12 (×5): qty 1

## 2024-05-12 MED ORDER — ALUM & MAG HYDROXIDE-SIMETH 200-200-20 MG/5ML PO SUSP
30.0000 mL | Freq: Once | ORAL | Status: AC
  Administered 2024-05-12: 30 mL via ORAL
  Filled 2024-05-12: qty 30

## 2024-05-12 MED ORDER — SENNOSIDES-DOCUSATE SODIUM 8.6-50 MG PO TABS: 1.0000 | ORAL_TABLET | Freq: Every evening | ORAL | Status: DC | PRN

## 2024-05-12 MED ORDER — FENTANYL CITRATE (PF) 50 MCG/ML IJ SOSY
25.0000 ug | PREFILLED_SYRINGE | Freq: Once | INTRAMUSCULAR | Status: AC | PRN
  Administered 2024-05-13: 25 ug via INTRAVENOUS
  Filled 2024-05-12: qty 1

## 2024-05-12 MED ORDER — FAMOTIDINE 20 MG PO TABS
40.0000 mg | ORAL_TABLET | Freq: Once | ORAL | Status: AC
  Administered 2024-05-12: 40 mg via ORAL
  Filled 2024-05-12: qty 2

## 2024-05-12 MED ORDER — FUROSEMIDE 10 MG/ML IJ SOLN
20.0000 mg | Freq: Once | INTRAMUSCULAR | Status: AC
  Administered 2024-05-12: 20 mg via INTRAVENOUS
  Filled 2024-05-12: qty 2

## 2024-05-12 MED ORDER — DILTIAZEM HCL-DEXTROSE 125-5 MG/125ML-% IV SOLN (PREMIX)
5.0000 mg/h | INTRAVENOUS | Status: DC
  Administered 2024-05-12 – 2024-05-13 (×2): 5 mg/h via INTRAVENOUS
  Filled 2024-05-12 (×2): qty 125

## 2024-05-12 MED ORDER — DILTIAZEM LOAD VIA INFUSION
10.0000 mg | Freq: Once | INTRAVENOUS | Status: AC
  Administered 2024-05-12: 10 mg via INTRAVENOUS
  Filled 2024-05-12: qty 10

## 2024-05-12 MED ORDER — ONDANSETRON HCL 4 MG PO TABS: 4.0000 mg | ORAL_TABLET | Freq: Four times a day (QID) | ORAL | Status: DC | PRN

## 2024-05-12 MED ORDER — ONDANSETRON HCL 4 MG/2ML IJ SOLN: 4.0000 mg | Freq: Four times a day (QID) | INTRAMUSCULAR | Status: DC | PRN

## 2024-05-12 MED ORDER — NITROGLYCERIN 0.4 MG SL SUBL
0.4000 mg | SUBLINGUAL_TABLET | Freq: Once | SUBLINGUAL | Status: AC | PRN
  Administered 2024-05-12: 0.4 mg via SUBLINGUAL
  Filled 2024-05-12: qty 1

## 2024-05-12 MED ORDER — ACETAMINOPHEN 325 MG PO TABS
650.0000 mg | ORAL_TABLET | Freq: Four times a day (QID) | ORAL | Status: DC | PRN
  Administered 2024-05-13 (×3): 650 mg via ORAL
  Filled 2024-05-12 (×3): qty 2

## 2024-05-12 MED ORDER — IOHEXOL 350 MG/ML SOLN
100.0000 mL | Freq: Once | INTRAVENOUS | Status: AC | PRN
  Administered 2024-05-12: 100 mL via INTRAVENOUS

## 2024-05-12 MED ORDER — ONDANSETRON HCL 4 MG/2ML IJ SOLN
4.0000 mg | Freq: Once | INTRAMUSCULAR | Status: AC
  Administered 2024-05-12: 4 mg via INTRAVENOUS
  Filled 2024-05-12: qty 2

## 2024-05-12 NOTE — Assessment & Plan Note (Signed)
 Anticoagulated on warfarin --Continue warfarin per pharmacy --Daily INR

## 2024-05-12 NOTE — ED Provider Notes (Addendum)
 " [place] [facility] Provider Note   CSN: [id number] Arrival date & time: 05/12/24  1528     Patient presents with: Chest Pain   [name] is a 83 y.o. female with a history of A-fib, DVT, PE, on Coumadin , presenting to the ED with complaint of generalized weakness.  Her cardiologist notes  that the patient is in permanent A-fib but they are endorsing a rate control strategy moving forward.  The patient reports she is here because she began having chest pressure that began earlier today, the left lower side of her chest, is now spreading to the upper side of her chest, and is quite severe.  She says she has had brief spasms in the past like this that are relieved with belching but this feels different, more intense.  She denies any history of MI or coronary stents.  I reviewed her cardiology note from July of this year, noting she has a history of DVT PE for which she is on Coumadin  and permanent A-fib, cannot tell when she goes in and out of A-fib.   HPI     Prior to Admission medications   Medication Sig Start Date End Date Taking? Authorizing Provider  acetaminophen  (TYLENOL ) 325 MG tablet Take 325-650 mg by mouth 2 (two) times daily as needed for mild pain or headache.    [provider]  Carboxymethylcellulose Sodium (REFRESH LIQUIGEL OP) Place 1 drop into both eyes at bedtime.    [provider]  cephALEXin  (KEFLEX ) 500 MG capsule Take 1 capsule (500 mg total) by mouth 4 (four) times daily for 10 days. 05/03/24 05/13/24  Teddy Sharper, FNP  diltiazem  (TIAZAC ) 360 MG 24 hr capsule Take 360 mg by mouth at bedtime.    [provider]  furosemide  (LASIX ) 20 MG tablet Take 2 tablets (40 mg total) by mouth daily. 12/31/23   Tolia, Sunit, DO  irbesartan  (AVAPRO ) 300 MG tablet Take 1 tablet (300 mg total) by mouth daily. 09/27/22   Sherrill Cable Latif, DO  levothyroxine  (SYNTHROID , LEVOTHROID) 100 MCG tablet Take 100  mcg by mouth daily before breakfast.  10/07/14   [provider]  LORazepam  (ATIVAN ) 1 MG tablet Take 1.5 mg by mouth See admin instructions. Take 1.5 mg by mouth at bedtime and an additional 1-1.5mg  once a day as needed for anxiety    [provider]  Lutein-Zeaxanthin 25-5 MG CAPS Take 1 capsule by mouth daily with breakfast.    [provider]  nebivolol  (BYSTOLIC ) 5 MG tablet Take 1 tablet (5 mg total) by mouth daily. 09/27/22   Sherrill Cable Latif, DO  nystatin  (MYCOSTATIN /NYSTOP ) powder Apply 1 Application topically 3 (three) times daily. 10/08/22   Lucien Orren SAILOR, PA-C  OXYGEN  Inhale 3.5 L/min into the lungs at bedtime.    [provider]  Potassium Chloride  ER 20 MEQ TBCR Take 1 tablet by mouth daily. 10/06/22   [provider]  PRESCRIPTION MEDICATION CPAP- At bedtime    [provider]  SIMPLY SALINE NA Place 1 spray into both nostrils as needed (for dryness).    [provider]  sulfamethoxazole -trimethoprim  (BACTRIM  DS) 800-160 MG tablet Take 1 tablet by mouth 2 (two) times daily for 7 days. 05/08/24 05/15/24  Hazen Darryle BRAVO, FNP  triamcinolone cream (KENALOG) 0.1 % Apply 1 Application topically See admin instructions. Apply to both legs 2 times a day 04/05/21   [provider]  warfarin (COUMADIN ) 5 MG tablet Take  5 mg by mouth at bedtime. 04/25/15   [provider]    Allergies: Codeine, Erythromycin, Tetracyclines & related, Statins, Atenolol, Citalopram hydrobromide, Clindamycin /lincomycin, Coenzyme q10, Losartan potassium, Motrin [ibuprofen], Rosuvastatin, Simvastatin, Tape, and Latex    Review of Systems  Updated Vital Signs BP (!) 158/95   Pulse (!) 114   Temp 98.2 F (36.8 C) (Oral)   Resp (!) 28   Ht 5' 4 (1.626 m)   Wt 108.4 kg   SpO2 95%   BMI 41.02 kg/m   Physical Exam Constitutional:      General: She is not in acute distress. HENT:     Head: Normocephalic and atraumatic.  Eyes:      Conjunctiva/sclera: Conjunctivae normal.     Pupils: Pupils are equal, round, and reactive to light.  Cardiovascular:     Rate and Rhythm: Tachycardia present. Rhythm irregular.  Pulmonary:     Effort: Pulmonary effort is normal. No respiratory distress.     Comments: Mild tachypnea, rhonchi lower lobes Abdominal:     General: There is no distension.     Tenderness: There is no abdominal tenderness.     Comments: No evident cellulitis or pannus skin infection  Musculoskeletal:     Right lower leg: Edema present.     Left lower leg: Edema present.  Skin:    General: Skin is warm and dry.  Neurological:     General: No focal deficit present.     Mental Status: She is alert. Mental status is at baseline.  Psychiatric:        Mood and Affect: Mood normal.        Behavior: Behavior normal.     (all labs ordered are listed, but only abnormal results are displayed) Labs Reviewed  BASIC METABOLIC PANEL WITH GFR - Abnormal; Notable for the following components:      Result Value   Sodium 134 (*)    Chloride 97 (*)    Glucose, Bld 183 (*)    All other components within normal limits  CBC - Abnormal; Notable for the following components:   WBC 11.2 (*)    All other components within normal limits  PROTIME-INR - Abnormal; Notable for the following components:   Prothrombin Time 37.2 (*)    INR 3.6 (*)    All other components within normal limits  TROPONIN T, HIGH SENSITIVITY - Abnormal; Notable for the following components:   Troponin T High Sensitivity 22 (*)    All other components within normal limits  TROPONIN T, HIGH SENSITIVITY - Abnormal; Notable for the following components:   Troponin T High Sensitivity 21 (*)    All other components within normal limits  RESP PANEL BY RT-PCR (RSV, FLU A&B, COVID)  RVPGX2  PRO BRAIN NATRIURETIC PEPTIDE    EKG: EKG Interpretation Date/Time:  Thursday May 12 2024 15:53:05 EST Ventricular Rate:  92 PR Interval:    QRS  Duration:  96 QT Interval:  348 QTC Calculation: 431 R Axis:   62  Text Interpretation: Atrial fibrillation Ventricular premature complex Low voltage, extremity and precordial leads Confirmed by Cottie Cough [phone]) on 05/12/2024 4:56:46 PM  Radiology: CT Angio Chest/Abd/Pel for Dissection W and/or Wo Contrast Result Date: 05/12/2024 CLINICAL DATA:  Acute aortic syndrome suspected. EXAM: CT ANGIOGRAPHY CHEST, ABDOMEN AND PELVIS TECHNIQUE: Non-contrast CT of the chest was initially obtained. Multidetector CT imaging through the chest, abdomen and pelvis was performed using the standard protocol during bolus administration of intravenous contrast. Multiplanar  reconstructed images and MIPs were obtained and reviewed to evaluate the vascular anatomy. RADIATION DOSE REDUCTION: This exam was performed according to the departmental dose-optimization program which includes automated exposure control, adjustment of the mA and/or kV according to patient size and/or use of iterative reconstruction technique. CONTRAST:  OMNIPAQUE  IOHEXOL  350 MG/ML SOLN COMPARISON:  CT abdomen and pelvis 04/12/2018 FINDINGS: CTA CHEST FINDINGS Cardiovascular: Heart is enlarged. There is satisfactory opacification of the thoracic aorta. The aorta is normal in size. There is no evidence for dissection or aneurysm. There are mild calcified atherosclerotic plaque throughout the aorta and coronary arteries. There is no pericardial effusion. Mediastinum/Nodes: There are enlarged mediastinal and hilar lymph nodes. There is a 16 mm hypodense nodule in the left thyroid . Visualized esophagus is within normal limits. Lungs/Pleura: There are atelectatic changes in the lung bases and lingula. There is no new focal lung infiltrate, pleural effusion or pneumothorax. There are calcified granulomas in the right lung. Musculoskeletal: Degenerative changes affect the spine. Review of the MIP images confirms the above findings. CTA ABDOMEN AND  PELVIS FINDINGS VASCULAR Aorta: Normal caliber aorta without aneurysm, dissection, vasculitis or significant stenosis. There are atherosclerotic calcifications of the aorta. Celiac: Patent without evidence of aneurysm, dissection, vasculitis or significant stenosis. SMA: Patent without evidence of aneurysm, dissection, vasculitis or significant stenosis. Renals: Both renal arteries are patent without evidence of aneurysm, dissection, vasculitis, fibromuscular dysplasia or significant stenosis. IMA: Patent without evidence of aneurysm, dissection, vasculitis or significant stenosis. Inflow: Patent without evidence of aneurysm, dissection, vasculitis or significant stenosis. Veins: No obvious venous abnormality within the limitations of this arterial phase study. Review of the MIP images confirms the above findings. NON-VASCULAR Hepatobiliary: No focal liver abnormality is seen. Status post cholecystectomy. No biliary dilatation. There is a calcified granuloma in the left lobe. Pancreas: Unremarkable. No pancreatic ductal dilatation or surrounding inflammatory changes. Spleen: Normal in size without focal abnormality. Adrenals/Urinary Tract: Adrenal glands are unremarkable. Kidneys are normal, without renal calculi, focal lesion, or hydronephrosis. Bladder is unremarkable. Stomach/Bowel: Stomach is within normal limits. No evidence of bowel wall thickening, distention, or inflammatory changes. There is sigmoid colon diverticulosis. The appendix is not visualized. Lymphatic: There are mildly enlarged and nonenlarged left retroperitoneal lymph nodes measuring up to 1 cm. There are prominent and mildly enlarged iliac chain lymph nodes measuring up to 1 cm. There are enlarged bilateral inguinal lymph nodes measuring up to 18 mm short axis. Reproductive: Bilateral ovaries are within normal limits. Uterus is surgically absent. Other: There is no ascites or free air. There is a moderate-sized fat containing umbilical hernia.  There is lower anterior abdominal wall subcutaneous stranding and skin thickening. Musculoskeletal: No acute or significant osseous findings. Review of the MIP images confirms the above findings. IMPRESSION: 1. No evidence for aortic dissection or aneurysm. 2. Cardiomegaly. 3. Mediastinal, hilar, retroperitoneal and inguinal lymphadenopathy. Findings are worrisome for metastatic disease or lymphoma. 4. Lower anterior abdominal wall subcutaneous stranding and skin thickening. Correlate clinically for cellulitis. 5. Colonic diverticulosis. 6. Moderate-sized fat containing umbilical hernia. 7. 16 mm left thyroid  nodule, indeterminate. Recommend thyroid  US  (ref: J Am Coll Radiol. 2015 Feb;12(2): 143-50). 8. Aortic atherosclerosis. Aortic Atherosclerosis (ICD10-I70.0). Electronically Signed   By: Greig Pique M.D.   On: 05/12/2024 18:50   DG Chest 2 View Result Date: 05/12/2024 CLINICAL DATA:  chest pain EXAM: CHEST - 2 VIEW COMPARISON:  August 13, 2023 FINDINGS: No focal airspace consolidation, pleural effusion, or pneumothorax. No cardiomegaly. Calcified hilar lymph nodes. Tortuous  aorta with aortic atherosclerosis. No acute fracture or destructive lesions. Multilevel thoracic osteophytosis. IMPRESSION: No acute cardiopulmonary abnormality. Electronically Signed   By: Rogelia Myers M.D.   On: 05/12/2024 16:49     .Critical Care  Performed by: Cottie Donnice PARAS, MD Authorized by: Cottie Donnice PARAS, MD   Critical care provider statement:    Critical care time (minutes):  45   Critical care time was exclusive of:  Separately billable procedures and treating other patients   Critical care was necessary to treat or prevent imminent or life-threatening deterioration of the following conditions:  Respiratory failure and circulatory failure   Critical care was time spent personally by me on the following activities:  Ordering and performing treatments and interventions, ordering and review of laboratory  studies, ordering and review of radiographic studies, pulse oximetry, review of old charts, examination of patient and evaluation of patient's response to treatment   Care discussed with: admitting provider   Comments:     IV diuresis for suspected congestive heart failure with hypoxia    Medications Ordered in the ED  fentaNYL  (SUBLIMAZE ) injection 25 mcg (has no administration in time range)  alum & mag hydroxide-simeth (MAALOX/MYLANTA) 200-200-20 MG/5ML suspension 30 mL (has no administration in time range)  famotidine  (PEPCID ) tablet 40 mg (has no administration in time range)  nebivolol  (BYSTOLIC ) tablet 5 mg (has no administration in time range)  furosemide  (LASIX ) injection 20 mg (has no administration in time range)  nitroGLYCERIN  (NITROSTAT ) SL tablet 0.4 mg (0.4 mg Sublingual Given 05/12/24 1709)  iohexol  (OMNIPAQUE ) 350 MG/ML injection 100 mL (100 mLs Intravenous Contrast Given 05/12/24 1735)    Clinical Course as of 05/12/24 2127  Thu May 12, 2024  1734 Dr hilty cardiology advising proceed with CT imaging, repeat troponin, call back as needed.  No change in CP with nitroglycerin  given [MT]  1902 The patient does not have evidence of cellulitis on her abdomen or under the pannus to correlate with CT imaging.  Is likely related to significant edema which she does have peripheral including her lower legs with suspected stasis dermatitis.  She reports she has been on 2 courses of antibiotics and redness of the legs with failure to improve. [MT]  2023 Cardiology fellow Oneil agrees with plan for hospitalist admission, IV diuresis and rate control; they will consult in the hospital. I spoke to the hospitalist Dr. Fausto now who will admit the patient. [MT]    Clinical Course User Index [MT] Cleon Thoma, Donnice PARAS, MD                                 Medical Decision Making Amount and/or Complexity of Data Reviewed Labs: ordered. Radiology: ordered.  Risk OTC drugs. Prescription drug  management. Decision regarding hospitalization.   This patient presents to the ED with concern for chest pain and discomfort. This involves an extensive number of treatment options, and is a complaint that carries with it a high risk of complications and morbidity.  The differential diagnosis includes reflux versus ACS versus PE versus aortic pathology versus pneumonia versus pneumothorax versus other  Viral illness including influenza is also on the differential for symptoms  Co-morbidities that complicate the patient evaluation: Advanced age as a risk factors for cardiovascular disease  I ordered and personally interpreted labs.  The pertinent results include: Troponin is low 22 -> 21, flat.  INR 3.6 on coumadin .  Flu and COVID test  are negative.  She has a very minor leukocytosis.  BMP is pending  I ordered imaging studies including CT dissection study of the chest abdomen pelvis, x-ray of the chest I independently visualized and interpreted imaging which showed cardiomegaly, incidental findings as noted, enlarged and possibly reactive lymph nodes, which may be a source of the patient's pain.  Incidental diverticulosis noted.  Reported stranding of the lower abdominal wall does not correlate with cellulitis on exam I agree with the radiologist interpretation  The patient was maintained on a cardiac monitor.  I personally viewed and interpreted the cardiac monitored which showed an underlying rhythm of: A-fib with tachycardia, mild borderline RVR  Per my interpretation the patient's ECG shows atrial fibrillation  I ordered medication including home Bystolic  for heart rate control, Lasix  for diuresis for hypoxia  I have reviewed the patients home medicines and have made adjustments as needed  Test Considered: No evidence of large centralized pulmonary embolism or clear heart strain from the patient's angiogram of the chest and troponin levels.  This was not a dedicated PE study, the patient  is also therapeutic on her Coumadin , and I do not think she is needing a repeat angiogram at this time.  She may benefit from echocardiogram in the hospital  I requested consultation with the cardiology,  and discussed lab and imaging findings as well as pertinent plan - they recommend: medical admission, cardiology to consult in hospital  After the interventions noted above, I reevaluated the patient and found that they have: stayed the same   Dispostion:  After consideration of the diagnostic results and the patients response to treatment, I feel that the patent would benefit from medical admission.      Final diagnoses:  Chest pain, unspecified type  Hypoxia    ED Discharge Orders     None          Cottie Donnice PARAS, MD 05/12/24 2128    Cottie Donnice PARAS, MD 06/13/24 [phone]  "

## 2024-05-12 NOTE — Assessment & Plan Note (Signed)
 Patient has erythematous bilateral lower extremities with weeping and edema.  Legs are both warm but symmetric.  Patient reports being on antibiotics from urgent care since the 25th.  It is possible this is driving her A-fib RVR.  Patient afebrile without leukocytosis on admission. --Continue outpatient of antibiotics pending med history

## 2024-05-12 NOTE — Assessment & Plan Note (Addendum)
 Suspect secondary to A-fib with RVR. Troponin 22 >> 21.  And nonacute EKG.  Unlikely ACS.  Chest pain did not improve with sublingual nitro in the ED. -- Management of A-fib as outlined -- Cardiology to see patient tomorrow -- Follow-up pending echo -- Repeat EKG and troponin if worsening chest pain --Telemetry

## 2024-05-12 NOTE — ED Notes (Signed)
 Pt came back from CT with significant Sob after exertion. Pt amblated to the bathroom while over in Radiology and got SOB. Told staff she uses O2 at 3.5lpm at home, particularly when at night. Placed pt on O2, will notfiy RT.

## 2024-05-12 NOTE — ED Notes (Signed)
 Hospitalist requested 10mg  bolus of Cardizem .

## 2024-05-12 NOTE — Assessment & Plan Note (Addendum)
 See A-fib with RVR

## 2024-05-12 NOTE — H&P (Signed)
 Telemedicine History and Physical    Patient: [name] FMW:[id number] DOB: [birth year] DOA: 05/12/2024 DOS: the patient was seen and examined on 05/12/2024 PCP: [name], MD   Referring Provider: Donnice Hughes, MD Telemedicine Provider: Burnard Cunning, DO Patient Location: [facility] ED Referring Diagnosis: A-fib with RVR, atypical chest pain Patient Name and DOB verified: [name], 02-16-1941 Patient consented to Telemedicine Evaluation: yes RN virtual assistant: Megan Lares, Paramedic Video encounter time and date: 05/12/24 8:33 PM   Patient coming from: Home  Chief Complaint:  Chief Complaint  Patient presents with   Chest Pain   HPI: [name] is a 83 y.o. female with medical history significant of A-fib, DVT, PE, on Coumadin , hypertension, hypothyroidism, OSA on nocturnal CPAP, currently on antibiotics for BLE cellulitis since 11/25 from urgent care.  Pt presented to [facility] ED for evaluation of chest pain today.  Patient reports acute onset this morning at 10 AM while in bathroom.  Patient describes the pain as pressure in her chest initially on the left side currently more centrally located.  Chest pain worsens with deep inspiration.  She describes the pain as similar to when she has had a cracked rib in the past.  She is currently having some nausea but denies recent abdominal pain nausea vomiting or diarrhea.  She reports being on oral antibiotics for cellulitis in her legs.  Denies fevers but reports being cold natured with intermittent chills baseline.  No recent illnesses including cough congestion or sore throat.  She reports some sinus drainage that is typical for her and she attributes to CPAP use.  Denies any UTI symptoms including dysuria or frequency other than what is typical for her with her diuretic.  Patient has long history of A-fib but denies prior episodes of fast heartbeat so she is not sure if she  is able to tell when her heart is racing.  She otherwise reports generalized weakness but no other recent symptoms.  ED course -- Initial vitals: Temp 98.2, HR in the 90s up to 120s and 130s, respiratory rate in the 20s to 30, BP 149/80 and O2 sats in the 90s on 3.5 L/min nasal cannula O2. Labs obtained including CBC and BMP were notable for sodium 134, chloride 97, nonfasting glucose 183, WBC 11.2.  Troponin 22 >> 21.  proBNP 1710.  INR 3.6.  Next line viral PCR is negative for COVID, flu A/B and RSV.  Imaging: CT angio chest abdomen pelvis showed no evidence of aortic dissection or aneurysm, cardiomegaly, mediastinal hilar retroperitoneal and inguinal lymphadenopathy (worrisome for metastatic disease or lymphoma), and other findings per report.  ED provider consulted cardiology and discussed the case with the on-call fellow who agreed with admission and plan to see the patient in consultation tomorrow.  Patient was treated in the ED initially with 5 mg Bystolic  p.o. with 20 mg IV Lasix , and was later started on Cardizem  drip for persistent heart rates with A-fib in the 120s to 130s on the monitor  Patient is being admitted to the hospital for further evaluation and management of A-fib with RVR and chest pain as outlined in detail below.    Review of Systems: As mentioned in the history of present illness. All other systems reviewed and are negative.   Past Medical History:  Diagnosis Date   Anxiety    takes Lorazepam  daily as needed   Arthritis    Cancer (HCC)    thyroid   Cataracts, bilateral    immature   Chronic back pain    compressed vertebrae,takes Fosamax weekly   Family history of adverse reaction to anesthesia    granddaughter gets very sick and mean   History of blood transfusion    no abnormal reaction noted   History of bronchitis 05/2015   History of colon polyps    benign   Hyperlipidemia    takes Zetia  daily   Hypertension    takes Diltiazem  and Avapro  daily    Hypothyroidism    takes Synthroid  daily   Ischemic colitis    hx of-many yrs ago   Joint pain    Joint swelling    OSA on CPAP    05-08-12 AHi was 46, RDI  53. titrated to   9 cm water  with 3 cm flex.-nadir 75%   PE (pulmonary embolism)    takes Coumadin  daily   Peripheral edema    Pneumonia 1992   hx of   Sarcoidosis    Shortness of breath    Urinary frequency    Weakness    left leg.Numbness and tingling.Has sciatica   Wheeze    occaionally. Not new   Past Surgical History:  Procedure Laterality Date   ABDOMINAL HYSTERECTOMY     APPENDECTOMY     BREAST BIOPSY     biopsies on right x 2   BREAST CYST ASPIRATION Left    CHOLECYSTECTOMY N/A 11/29/2015   Procedure: LAPAROSCOPIC CHOLECYSTECTOMY;  Surgeon: Jina Nephew, MD;  Location: [facility] OR;  Service: General;  Laterality: N/A;   COLONOSCOPY     DILATION AND CURETTAGE OF UTERUS     LIVER BIOPSY     MELANOMA EXCISION     removed from left leg   scalene surgery  1972   trying to find out what was wrong with her lungs   THYROID  SURGERY     Social History:  reports that she has never smoked. She has never used smokeless tobacco. She reports that she does not drink alcohol  and does not use drugs.  Allergies  Allergen Reactions   Codeine Other (See Comments)    Caused Hyperactivity and Dysphoria   Erythromycin Itching, Anxiety and Other (See Comments)    Also with jitters   Tetracyclines & Related Itching    Also with jitters   Statins Other (See Comments)    Leg muscle weakness   Atenolol Other (See Comments)    Depression    Citalopram Hydrobromide Nausea Only and Other (See Comments)    Nausea/Fatigue   Clindamycin /Lincomycin Other (See Comments)    Via IV, has caused a metallic taste in the mouth    Coenzyme Q10 Itching   Losartan Potassium Other (See Comments)    Muscle aches    Motrin [Ibuprofen] Other (See Comments)    Is to not take this   Rosuvastatin Other (See Comments)    Myalgias   Simvastatin  Other (See Comments)    Myalgias   Tape     PAPER TAPE IS PPEFERRED   Latex Rash    Family History  Problem Relation Age of Onset   Breast cancer Mother    CVA Mother    Breast cancer Sister    Breast cancer Sister    Heart attack Neg Hx    Sleep apnea Neg Hx     Prior to Admission medications   Medication Sig Start Date End Date Taking? Authorizing Provider  acetaminophen  (TYLENOL ) 325 MG tablet Take 325-650 mg  by mouth 2 (two) times daily as needed for mild pain or headache.    [provider]  Carboxymethylcellulose Sodium (REFRESH LIQUIGEL OP) Place 1 drop into both eyes at bedtime.    [provider]  cephALEXin  (KEFLEX ) 500 MG capsule Take 1 capsule (500 mg total) by mouth 4 (four) times daily for 10 days. 05/03/24 05/13/24  Teddy Sharper, FNP  diltiazem  (TIAZAC ) 360 MG 24 hr capsule Take 360 mg by mouth at bedtime.    [provider]  furosemide  (LASIX ) 20 MG tablet Take 2 tablets (40 mg total) by mouth daily. 12/31/23   Tolia, Sunit, DO  irbesartan  (AVAPRO ) 300 MG tablet Take 1 tablet (300 mg total) by mouth daily. 09/27/22   Sherrill Cable Latif, DO  levothyroxine  (SYNTHROID , LEVOTHROID) 100 MCG tablet Take 100 mcg by mouth daily before breakfast.  10/07/14   [provider]  LORazepam  (ATIVAN ) 1 MG tablet Take 1.5 mg by mouth See admin instructions. Take 1.5 mg by mouth at bedtime and an additional 1-1.5mg  once a day as needed for anxiety    [provider]  Lutein-Zeaxanthin 25-5 MG CAPS Take 1 capsule by mouth daily with breakfast.    [provider]  nebivolol  (BYSTOLIC ) 5 MG tablet Take 1 tablet (5 mg total) by mouth daily. 09/27/22   Sheikh, Omair Latif, DO  nystatin  (MYCOSTATIN /NYSTOP ) powder Apply 1 Application topically 3 (three) times daily. 10/08/22   Lucien Orren SAILOR, PA-C  OXYGEN  Inhale 3.5 L/min into the lungs at bedtime.    [provider]  Potassium Chloride  ER 20 MEQ TBCR Take 1 tablet by mouth daily.  10/06/22   [provider]  PRESCRIPTION MEDICATION CPAP- At bedtime    [provider]  SIMPLY SALINE NA Place 1 spray into both nostrils as needed (for dryness).    [provider]  sulfamethoxazole -trimethoprim (BACTRIM DS) 800-160 MG tablet Take 1 tablet by mouth 2 (two) times daily for 7 days. 05/08/24 05/15/24  Hazen Darryle BRAVO, FNP  triamcinolone cream (KENALOG) 0.1 % Apply 1 Application topically See admin instructions. Apply to both legs 2 times a day 04/05/21   [provider]  warfarin (COUMADIN ) 5 MG tablet Take 5 mg by mouth at bedtime. 04/25/15   [provider]    Physical Exam: Vitals:   05/12/24 2100 05/12/24 2114 05/12/24 2115 05/12/24 2130  BP: (!) 158/95  (!) 142/81 (!) 135/97  Pulse: (!) 113 (!) 102 (!) 111 (!) 106  Resp: (!) 21 17 (!) 25 (!) 26  Temp:      TempSrc:      SpO2: 96% 95% 94% 93%  Weight:      Height:       Bedside physical exam was performed by RN listed above. Below exam findings are based on their in person physical exam findings and my observations during virtual encounter.  General exam: awake, alert, no acute distress, obese HEENT: moist mucus membranes, hearing grossly normal  Respiratory system: Lungs overall clear with right posterior rhonchi, normal respiratory effort.  On 3 L/min [place] O2 Cardiovascular system: normal S1/S2, irregularly irregular, bilateral lower extremity edema with weeping Gastrointestinal system: soft, NT, bowel sounds present, umbilical hernia noted Central nervous system: A&O x 3. no gross focal neurologic deficits, normal speech Extremities: Bilateral lower extremities with edema and weeping, venous stasis changes and erythema that is warm to touch but symmetric Psychiatry: normal mood, congruent affect, judgement and insight appear normal   Data Reviewed:  Labs and  diagnostic studies as reviewed above  Assessment and Plan:  * Atrial fibrillation with RVR (HCC) Patient  presents with acute onset of chest pain this morning that has been persistent since that time.  History of persistent A-fib.  Heart rates in the ED in the 120s to 130s.  On warfarin for anticoagulation. --Admit to inpatient --Cardiology was consulted in the ED and plan to see patient tomorrow --Started on Cardizem  drip, continue --Telemetry --Echocardiogram ordered --Monitor and replace electrolytes --Continue warfarin per pharmacy  Atypical chest pain Suspect secondary to A-fib with RVR. Troponin 22 >> 21.  And nonacute EKG.  Unlikely ACS.  Chest pain did not improve with sublingual nitro in the ED. -- Management of A-fib as outlined -- Cardiology to see patient tomorrow -- Follow-up pending echo -- Repeat EKG and troponin if worsening chest pain --Telemetry  Acute on chronic heart failure with preserved ejection fraction (HFpEF) (HCC) Patient seems mildly decompensated with weeping lower extremity edema and proBNP mildly elevated.  Suspect A-fib with RVR causing mild CHF decompensation. Patient given 20 mg IV Lasix  in the ED. -- Monitor volume status closely --Further IV diuresis if needed --Monitor daily weights and strict I/O's  Lower extremity cellulitis Patient has erythematous bilateral lower extremities with weeping and edema.  Legs are both warm but symmetric.  Patient reports being on antibiotics from urgent care since the 25th.  It is possible this is driving her A-fib RVR.  Patient afebrile without leukocytosis on admission. --Continue outpatient of antibiotics pending med history  Warfarin anticoagulation --Continue warfarin per pharmacy --Daily INR  Adenopathy CTA of chest abdomen and pelvis showed mediastinal, hilar, retroperitoneal and inguinal lymphadenopathy worrisome for medic static disease or lymphoma.  On chart review, I see mention of sarcoidosis going back many years but unclear if this is formal diagnosis or was suspected. --Further evaluation is  warranted --Consider oncology consult and potential biopsy  OSA (obstructive sleep apnea) Nightly CPAP ordered  DVT (deep venous thrombosis) (HCC) Anticoagulated on warfarin --Continue warfarin per pharmacy --Daily INR  Pulmonary embolism (HCC) Anticoagulated on warfarin --Continue warfarin per pharmacy --Daily INR  Permanent atrial fibrillation (HCC) See A-fib with RVR      Advance Care Planning: CODE STATUS full code  Consults: Cardiology consulted in the ED  Family Communication: None present during virtual encounter.  Patient updated in detail  Severity of Illness: The appropriate patient status for this patient is INPATIENT. Inpatient status is judged to be reasonable and necessary in order to provide the required intensity of service to ensure the patient's safety. The patient's presenting symptoms, physical exam findings, and initial radiographic and laboratory data in the context of their chronic comorbidities is felt to place them at high risk for further clinical deterioration. Furthermore, it is not anticipated that the patient will be medically stable for discharge from the hospital within 2 midnights of admission.   * I certify that at the point of admission it is my clinical judgment that the patient will require inpatient hospital care spanning beyond 2 midnights from the point of admission due to high intensity of service, high risk for further deterioration and high frequency of surveillance required.*  Author: Burnard DELENA Cunning, DO 05/12/2024 9:45 PM  For on call review [email or web address].

## 2024-05-12 NOTE — Assessment & Plan Note (Signed)
 CTA of chest abdomen and pelvis showed mediastinal, hilar, retroperitoneal and inguinal lymphadenopathy worrisome for medic static disease or lymphoma.  On chart review, I see mention of sarcoidosis going back many years but unclear if this is formal diagnosis or was suspected. --Further evaluation is warranted --Consider oncology consult and potential biopsy

## 2024-05-12 NOTE — Assessment & Plan Note (Signed)
 Patient seems mildly decompensated with weeping lower extremity edema and proBNP mildly elevated.  Suspect A-fib with RVR causing mild CHF decompensation. Patient given 20 mg IV Lasix  in the ED. -- Monitor volume status closely --Further IV diuresis if needed --Monitor daily weights and strict I/O's

## 2024-05-12 NOTE — ED Notes (Signed)
 Pt came in for SOB, acutely starting at 1000hrs today. Pt advised she's been coughing up mucous but has not spit it up to see what color it is. No blood tinge flavor noted. No trauma noted. Pt had L sided anterior chest wall tenderness and worsening pain with inspiration, but now the pain is to the R side.

## 2024-05-12 NOTE — ED Notes (Signed)
Patient is not ready to go on CPAP at this time. RT will continue to monitor.

## 2024-05-12 NOTE — ED Triage Notes (Signed)
 Pt c/o chest pain since appx 1000. Pain with coughing, deep breath.

## 2024-05-12 NOTE — ED Notes (Signed)
 Report and care report given to [facility] 6E RN for rm27. Pt is in the care of [facility] at this time.  No complaints noted.

## 2024-05-12 NOTE — Assessment & Plan Note (Signed)
 Nightly CPAP ordered

## 2024-05-12 NOTE — Assessment & Plan Note (Addendum)
 Patient presents with acute onset of chest pain this morning that has been persistent since that time.  History of persistent A-fib.  Heart rates in the ED in the 120s to 130s.  On warfarin for anticoagulation. --Admit to inpatient --Cardiology was consulted in the ED and plan to see patient tomorrow --Started on Cardizem  drip, continue --Telemetry --Echocardiogram ordered --Monitor and replace electrolytes --Continue warfarin per pharmacy

## 2024-05-12 NOTE — Assessment & Plan Note (Signed)
--  Continue warfarin per pharmacy --Daily INR

## 2024-05-13 ENCOUNTER — Other Ambulatory Visit (HOSPITAL_COMMUNITY): Payer: Self-pay

## 2024-05-13 ENCOUNTER — Inpatient Hospital Stay (HOSPITAL_COMMUNITY)

## 2024-05-13 ENCOUNTER — Encounter (HOSPITAL_COMMUNITY): Payer: Self-pay | Admitting: Internal Medicine

## 2024-05-13 DIAGNOSIS — R599 Enlarged lymph nodes, unspecified: Secondary | ICD-10-CM | POA: Diagnosis not present

## 2024-05-13 DIAGNOSIS — I5033 Acute on chronic diastolic (congestive) heart failure: Secondary | ICD-10-CM | POA: Diagnosis not present

## 2024-05-13 DIAGNOSIS — I110 Hypertensive heart disease with heart failure: Secondary | ICD-10-CM | POA: Diagnosis not present

## 2024-05-13 DIAGNOSIS — R0789 Other chest pain: Secondary | ICD-10-CM | POA: Diagnosis not present

## 2024-05-13 DIAGNOSIS — R0902 Hypoxemia: Secondary | ICD-10-CM | POA: Diagnosis not present

## 2024-05-13 DIAGNOSIS — R6510 Systemic inflammatory response syndrome (SIRS) of non-infectious origin without acute organ dysfunction: Secondary | ICD-10-CM | POA: Diagnosis not present

## 2024-05-13 DIAGNOSIS — Z91040 Latex allergy status: Secondary | ICD-10-CM | POA: Diagnosis not present

## 2024-05-13 DIAGNOSIS — G4733 Obstructive sleep apnea (adult) (pediatric): Secondary | ICD-10-CM | POA: Diagnosis not present

## 2024-05-13 DIAGNOSIS — E785 Hyperlipidemia, unspecified: Secondary | ICD-10-CM | POA: Diagnosis not present

## 2024-05-13 DIAGNOSIS — I2699 Other pulmonary embolism without acute cor pulmonale: Secondary | ICD-10-CM | POA: Diagnosis not present

## 2024-05-13 DIAGNOSIS — Z6841 Body Mass Index (BMI) 40.0 and over, adult: Secondary | ICD-10-CM | POA: Diagnosis not present

## 2024-05-13 DIAGNOSIS — R591 Generalized enlarged lymph nodes: Secondary | ICD-10-CM | POA: Diagnosis not present

## 2024-05-13 DIAGNOSIS — I5032 Chronic diastolic (congestive) heart failure: Secondary | ICD-10-CM | POA: Diagnosis not present

## 2024-05-13 DIAGNOSIS — R079 Chest pain, unspecified: Secondary | ICD-10-CM | POA: Diagnosis not present

## 2024-05-13 DIAGNOSIS — Z881 Allergy status to other antibiotic agents status: Secondary | ICD-10-CM | POA: Diagnosis not present

## 2024-05-13 DIAGNOSIS — R590 Localized enlarged lymph nodes: Secondary | ICD-10-CM | POA: Diagnosis not present

## 2024-05-13 DIAGNOSIS — I4891 Unspecified atrial fibrillation: Secondary | ICD-10-CM | POA: Diagnosis not present

## 2024-05-13 DIAGNOSIS — L03116 Cellulitis of left lower limb: Secondary | ICD-10-CM | POA: Diagnosis not present

## 2024-05-13 DIAGNOSIS — Z79890 Hormone replacement therapy (postmenopausal): Secondary | ICD-10-CM | POA: Diagnosis not present

## 2024-05-13 DIAGNOSIS — I825Y2 Chronic embolism and thrombosis of unspecified deep veins of left proximal lower extremity: Secondary | ICD-10-CM | POA: Diagnosis not present

## 2024-05-13 DIAGNOSIS — E039 Hypothyroidism, unspecified: Secondary | ICD-10-CM | POA: Diagnosis not present

## 2024-05-13 DIAGNOSIS — Z79899 Other long term (current) drug therapy: Secondary | ICD-10-CM | POA: Diagnosis not present

## 2024-05-13 DIAGNOSIS — R0781 Pleurodynia: Secondary | ICD-10-CM | POA: Diagnosis not present

## 2024-05-13 DIAGNOSIS — J9601 Acute respiratory failure with hypoxia: Secondary | ICD-10-CM | POA: Diagnosis not present

## 2024-05-13 DIAGNOSIS — Z7901 Long term (current) use of anticoagulants: Secondary | ICD-10-CM | POA: Diagnosis not present

## 2024-05-13 DIAGNOSIS — Z86711 Personal history of pulmonary embolism: Secondary | ICD-10-CM | POA: Diagnosis not present

## 2024-05-13 DIAGNOSIS — Z86718 Personal history of other venous thrombosis and embolism: Secondary | ICD-10-CM | POA: Diagnosis not present

## 2024-05-13 DIAGNOSIS — Z7983 Long term (current) use of bisphosphonates: Secondary | ICD-10-CM | POA: Diagnosis not present

## 2024-05-13 DIAGNOSIS — I898 Other specified noninfective disorders of lymphatic vessels and lymph nodes: Secondary | ICD-10-CM | POA: Diagnosis not present

## 2024-05-13 DIAGNOSIS — I2510 Atherosclerotic heart disease of native coronary artery without angina pectoris: Secondary | ICD-10-CM | POA: Diagnosis not present

## 2024-05-13 DIAGNOSIS — E041 Nontoxic single thyroid nodule: Secondary | ICD-10-CM | POA: Diagnosis not present

## 2024-05-13 DIAGNOSIS — I700 Atherosclerosis of aorta: Secondary | ICD-10-CM | POA: Diagnosis not present

## 2024-05-13 DIAGNOSIS — L03119 Cellulitis of unspecified part of limb: Secondary | ICD-10-CM | POA: Diagnosis not present

## 2024-05-13 DIAGNOSIS — Z85820 Personal history of malignant melanoma of skin: Secondary | ICD-10-CM | POA: Diagnosis not present

## 2024-05-13 DIAGNOSIS — I4821 Permanent atrial fibrillation: Secondary | ICD-10-CM | POA: Diagnosis not present

## 2024-05-13 DIAGNOSIS — I771 Stricture of artery: Secondary | ICD-10-CM | POA: Diagnosis not present

## 2024-05-13 DIAGNOSIS — L03115 Cellulitis of right lower limb: Secondary | ICD-10-CM | POA: Diagnosis not present

## 2024-05-13 LAB — BASIC METABOLIC PANEL WITH GFR
Anion gap: 9 (ref 5–15)
BUN: 13 mg/dL (ref 8–23)
CO2: 30 mmol/L (ref 22–32)
Calcium: 8.6 mg/dL — ABNORMAL LOW (ref 8.9–10.3)
Chloride: 97 mmol/L — ABNORMAL LOW (ref 98–111)
Creatinine, Ser: 0.94 mg/dL (ref 0.44–1.00)
GFR, Estimated: 59 mL/min — ABNORMAL LOW (ref >60)
Glucose, Bld: 141 mg/dL — ABNORMAL HIGH (ref 70–99)
Potassium: 3.9 mmol/L (ref 3.5–5.1)
Sodium: 136 mmol/L (ref 135–145)

## 2024-05-13 LAB — CBC
HCT: 35.5 % — ABNORMAL LOW (ref 36.0–46.0)
Hemoglobin: 11.8 g/dL — ABNORMAL LOW (ref 12.0–15.0)
MCH: 30.3 pg (ref 26.0–34.0)
MCHC: 33.2 g/dL (ref 30.0–36.0)
MCV: 91 fL (ref 80.0–100.0)
Platelets: 263 K/uL (ref 150–400)
RBC: 3.9 MIL/uL (ref 3.87–5.11)
RDW: 14.3 % (ref 11.5–15.5)
WBC: 15.8 K/uL — ABNORMAL HIGH (ref 4.0–10.5)
nRBC: 0 % (ref 0.0–0.2)

## 2024-05-13 LAB — ECHOCARDIOGRAM COMPLETE
AR max vel: 1.3 cm2
AV Area VTI: 1.42 cm2
AV Area mean vel: 1.17 cm2
AV Mean grad: 5 mmHg
AV Peak grad: 7.6 mmHg
Ao pk vel: 1.38 m/s
Area-P 1/2: 4.82 cm2
Calc EF: 59.4 %
Height: 64 in
MV VTI: 1.08 cm2
S' Lateral: 3.2 cm
Single Plane A2C EF: 57.4 %
Single Plane A4C EF: 58.8 %
Weight: 3836.8 [oz_av]

## 2024-05-13 LAB — C-REACTIVE PROTEIN: CRP: 24.8 mg/dL — ABNORMAL HIGH (ref <1.0)

## 2024-05-13 LAB — PROTIME-INR
INR: 3.2 — ABNORMAL HIGH (ref 0.8–1.2)
Prothrombin Time: 33.9 s — ABNORMAL HIGH (ref 11.4–15.2)

## 2024-05-13 LAB — TSH: TSH: 3.485 u[IU]/mL (ref 0.350–4.500)

## 2024-05-13 LAB — SEDIMENTATION RATE: Sed Rate: 121 mm/h — ABNORMAL HIGH (ref 0–22)

## 2024-05-13 MED ORDER — WARFARIN - PHARMACIST DOSING INPATIENT: Freq: Every day | Status: DC

## 2024-05-13 MED ORDER — WARFARIN SODIUM 2.5 MG PO TABS
2.5000 mg | ORAL_TABLET | Freq: Once | ORAL | Status: AC
  Administered 2024-05-13: 2.5 mg via ORAL
  Filled 2024-05-13: qty 1

## 2024-05-13 MED ORDER — LORAZEPAM 1 MG PO TABS
1.0000 mg | ORAL_TABLET | Freq: Every evening | ORAL | Status: DC | PRN
  Administered 2024-05-13 – 2024-05-15 (×3): 1 mg via ORAL
  Filled 2024-05-13 (×3): qty 1

## 2024-05-13 MED ORDER — DOXYCYCLINE HYCLATE 100 MG PO TABS
100.0000 mg | ORAL_TABLET | Freq: Two times a day (BID) | ORAL | Status: AC
  Administered 2024-05-13 – 2024-05-15 (×4): 100 mg via ORAL
  Filled 2024-05-13 (×4): qty 1

## 2024-05-13 MED ORDER — FUROSEMIDE 10 MG/ML IJ SOLN
20.0000 mg | Freq: Once | INTRAMUSCULAR | Status: AC
  Administered 2024-05-13: 20 mg via INTRAVENOUS
  Filled 2024-05-13: qty 2

## 2024-05-13 NOTE — Progress Notes (Signed)
 PHARMACY - ANTICOAGULATION CONSULT NOTE  Pharmacy Consult for warfarin Indication: Afib and h/o VTE  Allergies  Allergen Reactions   Codeine Other (See Comments)    Caused Hyperactivity and Dysphoria   Erythromycin Itching, Anxiety and Other (See Comments)    Also with jitters   Tetracyclines & Related Itching    Also with jitters   Statins Other (See Comments)    Leg muscle weakness   Atenolol Other (See Comments)    Depression    Citalopram Hydrobromide Nausea Only and Other (See Comments)    Nausea/Fatigue   Clindamycin /Lincomycin Other (See Comments)    Via IV, has caused a metallic taste in the mouth    Coenzyme Q10 Itching   Losartan Potassium Other (See Comments)    Muscle aches    Motrin [Ibuprofen] Other (See Comments)    Is to not take this   Rosuvastatin Other (See Comments)    Myalgias   Simvastatin Other (See Comments)    Myalgias   Tape     PAPER TAPE IS PPEFERRED   Latex Rash    Patient Measurements: Height: 5' 4 (162.6 cm) Weight: 108.4 kg (239 lb) IBW/kg (Calculated) : 54.7 HEPARIN DW (KG): 80.4  Vital Signs: Temp: 100 F (37.8 C) (12/05 0016) Temp Source: Oral (12/05 0016) BP: 125/76 (12/05 0016) Pulse Rate: 109 (12/05 0016)  Labs: Recent Labs    05/12/24 1548 05/12/24 1758  HGB 12.7  --   HCT 39.2  --   PLT 271  --   LABPROT  --  37.2*  INR  --  3.6*  CREATININE 0.90  --     Estimated Creatinine Clearance: 55 mL/min (by C-G formula based on SCr of 0.9 mg/dL).   Medical History: Past Medical History:  Diagnosis Date   Anxiety    takes Lorazepam  daily as needed   Arthritis    Cancer (HCC)    thyroid    Cataracts, bilateral    immature   Chronic back pain    compressed vertebrae,takes Fosamax weekly   Family history of adverse reaction to anesthesia    granddaughter gets very sick and mean   History of blood transfusion    no abnormal reaction noted   History of bronchitis 05/2015   History of colon polyps    benign    Hyperlipidemia    takes Zetia  daily   Hypertension    takes Diltiazem  and Avapro  daily   Hypothyroidism    takes Synthroid  daily   Ischemic colitis    hx of-many yrs ago   Joint pain    Joint swelling    OSA on CPAP    05-08-12 AHi was 46, RDI  53. titrated to   9 cm water  with 3 cm flex.-nadir 75%   PE (pulmonary embolism)    takes Coumadin  daily   Peripheral edema    Pneumonia 1992   hx of   Sarcoidosis    Shortness of breath    Urinary frequency    Weakness    left leg.Numbness and tingling.Has sciatica   Wheeze    occaionally. Not new    Medications:  Medications Prior to Admission  Medication Sig Dispense Refill Last Dose/Taking   acetaminophen  (TYLENOL ) 325 MG tablet Take 325-650 mg by mouth 2 (two) times daily as needed for mild pain or headache.      Carboxymethylcellulose Sodium (REFRESH LIQUIGEL OP) Place 1 drop into both eyes at bedtime.      cephALEXin  (KEFLEX ) 500 MG  capsule Take 1 capsule (500 mg total) by mouth 4 (four) times daily for 10 days. 40 capsule 0    diltiazem  (TIAZAC ) 360 MG 24 hr capsule Take 360 mg by mouth at bedtime.      furosemide  (LASIX ) 20 MG tablet Take 2 tablets (40 mg total) by mouth daily.      irbesartan  (AVAPRO ) 300 MG tablet Take 1 tablet (300 mg total) by mouth daily. 30 tablet 0    levothyroxine  (SYNTHROID , LEVOTHROID) 100 MCG tablet Take 100 mcg by mouth daily before breakfast.   1    LORazepam  (ATIVAN ) 1 MG tablet Take 1.5 mg by mouth See admin instructions. Take 1.5 mg by mouth at bedtime and an additional 1-1.5mg  once a day as needed for anxiety      Lutein-Zeaxanthin 25-5 MG CAPS Take 1 capsule by mouth daily with breakfast.      nebivolol  (BYSTOLIC ) 5 MG tablet Take 1 tablet (5 mg total) by mouth daily. 30 tablet 0    nystatin  (MYCOSTATIN /NYSTOP ) powder Apply 1 Application topically 3 (three) times daily. 30 g 0    OXYGEN  Inhale 3.5 L/min into the lungs at bedtime.      Potassium Chloride  ER 20 MEQ TBCR Take 1 tablet by  mouth daily.      PRESCRIPTION MEDICATION CPAP- At bedtime      SIMPLY SALINE NA Place 1 spray into both nostrils as needed (for dryness).      sulfamethoxazole -trimethoprim  (BACTRIM  DS) 800-160 MG tablet Take 1 tablet by mouth 2 (two) times daily for 7 days. 14 tablet 0    triamcinolone cream (KENALOG) 0.1 % Apply 1 Application topically See admin instructions. Apply to both legs 2 times a day      warfarin (COUMADIN ) 5 MG tablet Take 5 mg by mouth at bedtime.  2    Scheduled:   nebivolol   5 mg Oral Daily   Infusions:   diltiazem  (CARDIZEM ) infusion 5 mg/hr (05/12/24 2042)    Assessment: 83yo female c/o CP w/ coughing and deep inspiration, found to be in Afib w/ mild tachycardia and borderline RVR (does have h/o Afib and reports she cannot tell when she goes in and out of Afib), to continue warfarin for Afib and h/o VTE; current INR above goal (at last OP OV 11/26 INR was slightly above goal at 3.1 but no changes in warfarin regimen given so close to goal).  Goal of Therapy:  INR 2-3   Plan:  Hold warfarin today and monitor INR for dose adjustments.  Marvetta Dauphin, PharmD, BCPS  05/13/2024,12:38 AM

## 2024-05-13 NOTE — Progress Notes (Signed)
   05/13/24 0258  BiPAP/CPAP/SIPAP  $ Non-Invasive Home Ventilator  Initial  $ Face Mask Medium Yes  BiPAP/CPAP/SIPAP Pt Type Adult  BiPAP/CPAP/SIPAP Resmed  Mask Type Full face mask  Dentures removed? Not applicable  Mask Size Medium  Respiratory Rate 23 breaths/min  IPAP 12 cmH20  EPAP 5 cmH2O  Pressure Support 7 cmH20  PEEP 5 cmH20  Flow Rate 3.5 lpm  Minute Ventilation 10.5  Leak 0  Peak Inspiratory Pressure (PIP) 0  Tidal Volume (Vt) 407  Patient Home Machine No  Patient Home Mask No  Patient Home Tubing No  Auto Titrate Yes  Minimum cmH2O 5 cmH2O  Maximum cmH2O 12 cmH2O  Device Plugged into RED Power Outlet Yes  BiPAP/CPAP /SiPAP Vitals  Bilateral Breath Sounds Diminished;Clear

## 2024-05-13 NOTE — Consult Note (Signed)
 Cardiology Consultation   Patient ID: [name] MRN: [id number]; DOB: Dec 01, 1940  Admit date: 05/12/2024 Date of Consult: 05/13/2024  PCP:  [name], MD   [facility] Providers Cardiologist:  Madonna Large, DO   Patient Profile: [name] is a 83 y.o. female with a hx of permanent atrial fibrillation, DVT/PE 2008, hypertension, hypothyroidism, OSA on CPAP, cellulitis, who is being seen 05/13/2024 for the evaluation of chest pain at the request of Dr. Caleen.  History of Present Illness: [name]  but chronically managed on Coumadin  for no clear reasons.  No prior history of cardioversion or ablation.  Last seen in clinic 12/2023 and doing well.  Reiterating rate control strategy and not on antiarrhythmics, currently on Nebivolol .  No family history of CAD, no smoking history, no drugs or alcohol .  Not a known diabetic.  Currently patient being evaluated for chest pain.  Also reportedly to be in A-fib RVR HR 130s, initially started on diltiazem  drip however this has been weaned off.  Troponin 22-21.  Additionally, she has been treated for lower extremity cellulitis since 11/25.  CTA negative for any acute pathology.  However there is evidence of hilar/inguinal lymphadenopathy, primary has plans to perform inguinal lymph node biopsy.  16 mm left thyroid  nodule noted. She was given 1 dose of IV Lasix  20 mg today.  Patient reports that yesterday morning at approximately 10 AM she was sitting on the toilet and started noticing chest pain that she describes as feeling like a broken rib, chest pain worse with breathing in and out but not necessarily positional and not exertional.  Reports history of a shoulder procedure and has had chronic shoulder pain and also previously with some dental mishap where she has had residual toothache from this.  With these  collective symptoms she was concerned of having a heart attack.  Traditionally does not have any chest pain.  Reports persistent symptoms since the ED.  Otherwise at home she is functional, without any exertional symptoms denied any shortness of breath, orthopnea, peripheral edema.  She ambulates in her house but not outdoors, functionally limited by hip pain.  Denied any constitutional symptoms fever, cough, congestion or sick contacts.  Additionally, patient reports back in the 70s that she had diagnosis of sarcoid taken from a biopsy from her left arm.  Reported that she has not had any further workup for this and did not undergo any treatment.  proBNP 1710, potassium 3.9.  Creatinine 0.94.  WBC 15.8.  Hemoglobin 11.8.  Past Medical History:  Diagnosis Date   Anxiety    takes Lorazepam  daily as needed   Arthritis    Cancer (HCC)    thyroid    Cataracts, bilateral    immature   Chronic back pain    compressed vertebrae,takes Fosamax weekly   Family history of adverse reaction to anesthesia    granddaughter gets very sick and mean   History of blood transfusion    no abnormal reaction noted   History of bronchitis 05/2015   History of colon polyps    benign   Hyperlipidemia    takes Zetia  daily   Hypertension    takes Diltiazem  and Avapro  daily   Hypothyroidism    takes Synthroid  daily   Ischemic colitis    hx of-many yrs ago   Joint pain    Joint swelling    OSA on CPAP  05-08-12 AHi was 46, RDI  53. titrated to   9 cm water  with 3 cm flex.-nadir 75%   PE (pulmonary embolism)    takes Coumadin  daily   Peripheral edema    Pneumonia 1992   hx of   Sarcoidosis    Shortness of breath    Urinary frequency    Weakness    left leg.Numbness and tingling.Has sciatica   Wheeze    occaionally. Not new    Past Surgical History:  Procedure Laterality Date   ABDOMINAL HYSTERECTOMY     APPENDECTOMY     BREAST BIOPSY     biopsies on right x 2   BREAST CYST ASPIRATION  Left    CHOLECYSTECTOMY N/A 11/29/2015   Procedure: LAPAROSCOPIC CHOLECYSTECTOMY;  Surgeon: Jina Nephew, MD;  Location: [facility] OR;  Service: General;  Laterality: N/A;   COLONOSCOPY     DILATION AND CURETTAGE OF UTERUS     LIVER BIOPSY     MELANOMA EXCISION     removed from left leg   scalene surgery  1972   trying to find out what was wrong with her lungs   THYROID  SURGERY      Scheduled Meds:  furosemide   20 mg Intravenous Once   nebivolol   5 mg Oral Daily   warfarin  2.5 mg Oral ONCE-1600   Warfarin - Pharmacist Dosing Inpatient   Does not apply q1600   Continuous Infusions:  diltiazem  (CARDIZEM ) infusion 5 mg/hr (05/13/24 0200)   PRN Meds: acetaminophen  **OR** acetaminophen , ondansetron  **OR** ondansetron  (ZOFRAN ) IV, senna-docusate  Allergies:    Allergies  Allergen Reactions   Atenolol Other (See Comments)    Depression    Citalopram Hydrobromide Nausea Only and Other (See Comments)    Nausea/Fatigue   Clindamycin /Lincomycin Other (See Comments)    Via IV, has caused a metallic taste in the mouth    Codeine Other (See Comments)    Caused Hyperactivity and Dysphoria   Coenzyme Q10 Itching   Erythromycin Itching, Anxiety and Other (See Comments)    Also with jitters   Motrin [Ibuprofen] Other (See Comments)    Is to not take this   Statins Other (See Comments)    Leg muscle weakness w/ rosuvastatin and simvastatin    Tetracyclines & Related Itching and Other (See Comments)    Also with jitters   Latex Rash   Losartan Potassium Other (See Comments)    Muscle aches     Social History:   Social History   Socioeconomic History   Marital status: Married    Spouse name: Not on file   Number of children: Not on file   Years of education: Not on file   Highest education level: Not on file  Occupational History   Occupation: Retired    Associate Professor: RETIRED  Tobacco Use   Smoking status: Never   Smokeless tobacco: Never  Vaping Use   Vaping status: Never Used   Substance and Sexual Activity   Alcohol  use: No   Drug use: No   Sexual activity: Not on file  Other Topics Concern   Not on file  Social History Narrative   Caffeine: 8 oz soda per day   Social Drivers of Health   Financial Resource Strain: Not on file  Food Insecurity: No Food Insecurity (05/13/2024)   Hunger Vital Sign    Worried About Running Out of Food in the Last Year: Never true    Ran Out of Food in the Last Year:  Never true  Transportation Needs: No Transportation Needs (05/13/2024)   PRAPARE - Administrator, Civil Service (Medical): No    Lack of Transportation (Non-Medical): No  Physical Activity: Not on file  Stress: Not on file  Social Connections: Unknown (05/13/2024)   Social Connection and Isolation Panel    Frequency of Communication with Friends and Family: More than three times a week    Frequency of Social Gatherings with Friends and Family: More than three times a week    Attends Religious Services: Not on file    Active Member of Clubs or Organizations: Not on file    Attends Banker Meetings: Not on file    Marital Status: Not on file  Intimate Partner Violence: Not At Risk (05/13/2024)   Humiliation, Afraid, Rape, and Kick questionnaire    Fear of Current or Ex-Partner: No    Emotionally Abused: No    Physically Abused: No    Sexually Abused: No    Family History:   Family History  Problem Relation Age of Onset   Breast cancer Mother    CVA Mother    Breast cancer Sister    Breast cancer Sister    Heart attack Neg Hx    Sleep apnea Neg Hx      ROS:  Please see the history of present illness. All other ROS reviewed and negative.     Physical Exam/Data: Vitals:   05/13/24 0100 05/13/24 0409 05/13/24 0749 05/13/24 1216  BP:  (!) 110/52 114/65 134/77  Pulse: (!) 104 82 82 76  Resp:  20 18 20   Temp:   99.1 F (37.3 C) 98.8 F (37.1 C)  TempSrc:   Oral Oral  SpO2: 96% 94% 95%   Weight:  108.8 kg    Height:         Intake/Output Summary (Last 24 hours) at 05/13/2024 1359 Last data filed at 05/13/2024 1253 Gross per 24 hour  Intake 275.71 ml  Output 1400 ml  Net -1124.29 ml      05/13/2024    4:09 AM 05/12/2024    3:46 PM 12/31/2023    8:34 AM  Last 3 Weights  Weight (lbs) 239 lb 12.8 oz 239 lb 240 lb  Weight (kg) 108.773 kg 108.41 kg 108.863 kg     Body mass index is 41.16 kg/m.  General:  Well nourished, well developed, in no acute distress HEENT: normal Neck: no JVD Vascular: No carotid bruits; Distal pulses 2+ bilaterally Cardiac:  IRRR no murmurs Lungs:  clear to auscultation bilaterally, no wheezing, rhonchi or rales  Abd: soft, nontender, no hepatomegaly  Ext: no edema, CVI bilat Musculoskeletal:  No deformities, BUE and BLE strength normal and equal Skin: warm and dry  Neuro:  CNs 2-12 intact, no focal abnormalities noted Psych:  Normal affect   EKG:  The EKG was personally reviewed and demonstrates: Atrial fibrillation heart rate 92.  Low voltage.  No acute ST-T wave changes. Telemetry:  Telemetry was personally reviewed and demonstrates: Atrial fibrillation heart rate around 70  Relevant CV Studies: Echocardiogram 05/13/2024 1. Left ventricular ejection fraction, by estimation, is 55 to 60%. The  left ventricle has normal function. The left ventricle has no regional  wall motion abnormalities. There is mild left ventricular hypertrophy.  Left ventricular diastolic parameters  are indeterminate.   2. Right ventricular systolic function is normal. The right ventricular  size is normal. Tricuspid regurgitation signal is inadequate for assessing  PA pressure.  3. Left atrial size was moderately dilated.   4. Right atrial size was moderately dilated.   5. The mitral valve is normal in structure. Trivial mitral valve  regurgitation. No evidence of mitral stenosis.   6. The aortic valve is tricuspid. Aortic valve regurgitation is not  visualized. Aortic valve  sclerosis/calcification is present, without any  evidence of aortic stenosis.   7. The inferior vena cava is normal in size with greater than 50%  respiratory variability, suggesting right atrial pressure of 3 mmHg.     Laboratory Data: High Sensitivity Troponin:  No results for input(s): TROPONINIHS in the last 720 hours.   Chemistry Recent Labs  Lab 05/08/24 1309 05/12/24 1548 05/13/24 0412  NA 138 134* 136  K 4.6 4.2 3.9  CL 100 97* 97*  CO2 21 26 30   GLUCOSE 121* 183* 141*  BUN 13 16 13   CREATININE 0.75 0.90 0.94  CALCIUM  9.2 9.6 8.6*  GFRNONAA  --  >60 59*  ANIONGAP  --  11 9    No results for input(s): PROT, ALBUMIN, AST, ALT, ALKPHOS, BILITOT in the last 168 hours. Lipids No results for input(s): CHOL, TRIG, HDL, LABVLDL, LDLCALC, CHOLHDL in the last 168 hours.  Hematology Recent Labs  Lab 05/08/24 1309 05/12/24 1548 05/13/24 0412  WBC 10.2 11.2* 15.8*  RBC 4.49 4.33 3.90  HGB 13.3 12.7 11.8*  HCT 42.0 39.2 35.5*  MCV 94 90.5 91.0  MCH 29.6 29.3 30.3  MCHC 31.7 32.4 33.2  RDW 13.4 14.1 14.3  PLT 223 271 263   Thyroid  No results for input(s): TSH, FREET4 in the last 168 hours.  BNP Recent Labs  Lab 05/12/24 1806  PROBNP 1,710.0*    DDimer No results for input(s): DDIMER in the last 168 hours.  Radiology/Studies:  ECHOCARDIOGRAM COMPLETE Result Date: 05/13/2024    ECHOCARDIOGRAM REPORT   Patient Name:   [name] Date of Exam: 05/13/2024 Medical Rec #:  [id number]            Height:       64.0 in Accession #:    [id number]           Weight:       239.8 lb Date of Birth:  10-04-1940            BSA:          2.113 m Patient Age:    83 years             BP:           110/52 mmHg Patient Gender: F                    HR:           82 bpm. Exam Location:  Inpatient Procedure: 2D Echo (Both Spectral and Color Flow Doppler were utilized during            procedure). Indications:    Afib  History:        Patient has prior  history of Echocardiogram examinations.                 Arrythmias:Atrial Fibrillation.  Sonographer:    Norleen Amour Referring Phys: [id number] KELLY A GRIFFITH IMPRESSIONS  1. Left ventricular ejection fraction, by estimation, is 55 to 60%. The left ventricle has normal function. The left ventricle has no regional wall motion abnormalities. There is mild left ventricular hypertrophy. Left ventricular diastolic parameters are indeterminate.  2.  Right ventricular systolic function is normal. The right ventricular size is normal. Tricuspid regurgitation signal is inadequate for assessing PA pressure.  3. Left atrial size was moderately dilated.  4. Right atrial size was moderately dilated.  5. The mitral valve is normal in structure. Trivial mitral valve regurgitation. No evidence of mitral stenosis.  6. The aortic valve is tricuspid. Aortic valve regurgitation is not visualized. Aortic valve sclerosis/calcification is present, without any evidence of aortic stenosis.  7. The inferior vena cava is normal in size with greater than 50% respiratory variability, suggesting right atrial pressure of 3 mmHg. FINDINGS  Left Ventricle: Left ventricular ejection fraction, by estimation, is 55 to 60%. The left ventricle has normal function. The left ventricle has no regional wall motion abnormalities. The left ventricular internal cavity size was normal in size. There is  mild left ventricular hypertrophy. Left ventricular diastolic function could not be evaluated due to atrial fibrillation. Left ventricular diastolic parameters are indeterminate. Right Ventricle: The right ventricular size is normal. Right ventricular systolic function is normal. Tricuspid regurgitation signal is inadequate for assessing PA pressure. The tricuspid regurgitant velocity is 2.02 m/s, and with an assumed right atrial  pressure of 3 mmHg, the estimated right ventricular systolic pressure is 19.3 mmHg. Left Atrium: Left atrial size was moderately  dilated. Right Atrium: Right atrial size was moderately dilated. Pericardium: There is no evidence of pericardial effusion. Mitral Valve: The mitral valve is normal in structure. Trivial mitral valve regurgitation. No evidence of mitral valve stenosis. MV peak gradient, 5.8 mmHg. The mean mitral valve gradient is 2.0 mmHg. Tricuspid Valve: The tricuspid valve is normal in structure. Tricuspid valve regurgitation is trivial. No evidence of tricuspid stenosis. Aortic Valve: The aortic valve is tricuspid. Aortic valve regurgitation is not visualized. Aortic valve sclerosis/calcification is present, without any evidence of aortic stenosis. Aortic valve mean gradient measures 5.0 mmHg. Aortic valve peak gradient measures 7.6 mmHg. Pulmonic Valve: The pulmonic valve was not well visualized. Pulmonic valve regurgitation is not visualized. No evidence of pulmonic stenosis. Aorta: The aortic root is normal in size and structure. Venous: The inferior vena cava is normal in size with greater than 50% respiratory variability, suggesting right atrial pressure of 3 mmHg. IAS/Shunts: No atrial level shunt detected by color flow Doppler.  LEFT VENTRICLE PLAX 2D LVIDd:         5.00 cm     Diastology LVIDs:         3.20 cm     LV e' medial:    8.63 cm/s LV PW:         1.30 cm     LV E/e' medial:  13.2 LV IVS:        0.90 cm     LV e' lateral:   10.10 cm/s LVOT diam:     1.90 cm     LV E/e' lateral: 11.3 LV SV:         32 LV SV Index:   15 LVOT Area:     2.84 cm  LV Volumes (MOD) LV vol d, MOD A2C: 93.4 ml LV vol d, MOD A4C: 97.6 ml LV vol s, MOD A2C: 39.8 ml LV vol s, MOD A4C: 40.2 ml LV SV MOD A2C:     53.6 ml LV SV MOD A4C:     97.6 ml LV SV MOD BP:      58.5 ml RIGHT VENTRICLE             IVC RV Basal  diam:  4.00 cm     IVC diam: 1.90 cm RV S prime:     10.10 cm/s TAPSE (M-mode): 1.7 cm      PULMONARY VEINS                             Diastolic Velocity: 63.30 cm/s                             S/D Velocity:       0.40                              Systolic Velocity:  26.80 cm/s LEFT ATRIUM             Index        RIGHT ATRIUM           Index LA diam:        3.90 cm 1.85 cm/m   RA Area:     26.80 cm LA Vol (A2C):   78.5 ml 37.15 ml/m  RA Volume:   89.20 ml  42.21 ml/m LA Vol (A4C):   67.1 ml 31.75 ml/m LA Biplane Vol: 76.4 ml 36.15 ml/m  AORTIC VALVE                     PULMONIC VALVE AV Area (Vmax):    1.30 cm      PV Vmax:       0.68 m/s AV Area (Vmean):   1.17 cm      PV Peak grad:  1.9 mmHg AV Area (VTI):     1.42 cm AV Vmax:           138.00 cm/s AV Vmean:          103.000 cm/s AV VTI:            0.227 m AV Peak Grad:      7.6 mmHg AV Mean Grad:      5.0 mmHg LVOT Vmax:         63.10 cm/s LVOT Vmean:        42.400 cm/s LVOT VTI:          0.114 m LVOT/AV VTI ratio: 0.50  AORTA Ao Root diam: 2.90 cm Ao Asc diam:  3.00 cm MITRAL VALVE                TRICUSPID VALVE MV Area (PHT): 4.82 cm     TR Peak grad:   16.3 mmHg MV Area VTI:   1.08 cm     TR Vmax:        202.00 cm/s MV Peak grad:  5.8 mmHg MV Mean grad:  2.0 mmHg     SHUNTS MV Vmax:       1.20 m/s     Systemic VTI:  0.11 m MV Vmean:      57.7 cm/s    Systemic Diam: 1.90 cm MV Decel Time: 157 msec MV E velocity: 113.67 cm/s MV A velocity: 45.60 cm/s MV E/A ratio:  2.49 Redell Shallow MD Electronically signed by Redell Shallow MD Signature Date/Time: 05/13/2024/10:35:19 AM    Final    CT Angio Chest/Abd/Pel for Dissection W and/or Wo Contrast Result Date: 05/12/2024 CLINICAL DATA:  Acute aortic syndrome suspected. EXAM: CT ANGIOGRAPHY CHEST, ABDOMEN AND PELVIS TECHNIQUE: Non-contrast CT of the chest was  initially obtained. Multidetector CT imaging through the chest, abdomen and pelvis was performed using the standard protocol during bolus administration of intravenous contrast. Multiplanar reconstructed images and MIPs were obtained and reviewed to evaluate the vascular anatomy. RADIATION DOSE REDUCTION: This exam was performed according to the departmental dose-optimization  program which includes automated exposure control, adjustment of the mA and/or kV according to patient size and/or use of iterative reconstruction technique. CONTRAST:  OMNIPAQUE  IOHEXOL  350 MG/ML SOLN COMPARISON:  CT abdomen and pelvis 04/12/2018 FINDINGS: CTA CHEST FINDINGS Cardiovascular: Heart is enlarged. There is satisfactory opacification of the thoracic aorta. The aorta is normal in size. There is no evidence for dissection or aneurysm. There are mild calcified atherosclerotic plaque throughout the aorta and coronary arteries. There is no pericardial effusion. Mediastinum/Nodes: There are enlarged mediastinal and hilar lymph nodes. There is a 16 mm hypodense nodule in the left thyroid . Visualized esophagus is within normal limits. Lungs/Pleura: There are atelectatic changes in the lung bases and lingula. There is no new focal lung infiltrate, pleural effusion or pneumothorax. There are calcified granulomas in the right lung. Musculoskeletal: Degenerative changes affect the spine. Review of the MIP images confirms the above findings. CTA ABDOMEN AND PELVIS FINDINGS VASCULAR Aorta: Normal caliber aorta without aneurysm, dissection, vasculitis or significant stenosis. There are atherosclerotic calcifications of the aorta. Celiac: Patent without evidence of aneurysm, dissection, vasculitis or significant stenosis. SMA: Patent without evidence of aneurysm, dissection, vasculitis or significant stenosis. Renals: Both renal arteries are patent without evidence of aneurysm, dissection, vasculitis, fibromuscular dysplasia or significant stenosis. IMA: Patent without evidence of aneurysm, dissection, vasculitis or significant stenosis. Inflow: Patent without evidence of aneurysm, dissection, vasculitis or significant stenosis. Veins: No obvious venous abnormality within the limitations of this arterial phase study. Review of the MIP images confirms the above findings. NON-VASCULAR Hepatobiliary: No focal liver  abnormality is seen. Status post cholecystectomy. No biliary dilatation. There is a calcified granuloma in the left lobe. Pancreas: Unremarkable. No pancreatic ductal dilatation or surrounding inflammatory changes. Spleen: Normal in size without focal abnormality. Adrenals/Urinary Tract: Adrenal glands are unremarkable. Kidneys are normal, without renal calculi, focal lesion, or hydronephrosis. Bladder is unremarkable. Stomach/Bowel: Stomach is within normal limits. No evidence of bowel wall thickening, distention, or inflammatory changes. There is sigmoid colon diverticulosis. The appendix is not visualized. Lymphatic: There are mildly enlarged and nonenlarged left retroperitoneal lymph nodes measuring up to 1 cm. There are prominent and mildly enlarged iliac chain lymph nodes measuring up to 1 cm. There are enlarged bilateral inguinal lymph nodes measuring up to 18 mm short axis. Reproductive: Bilateral ovaries are within normal limits. Uterus is surgically absent. Other: There is no ascites or free air. There is a moderate-sized fat containing umbilical hernia. There is lower anterior abdominal wall subcutaneous stranding and skin thickening. Musculoskeletal: No acute or significant osseous findings. Review of the MIP images confirms the above findings. IMPRESSION: 1. No evidence for aortic dissection or aneurysm. 2. Cardiomegaly. 3. Mediastinal, hilar, retroperitoneal and inguinal lymphadenopathy. Findings are worrisome for metastatic disease or lymphoma. 4. Lower anterior abdominal wall subcutaneous stranding and skin thickening. Correlate clinically for cellulitis. 5. Colonic diverticulosis. 6. Moderate-sized fat containing umbilical hernia. 7. 16 mm left thyroid  nodule, indeterminate. Recommend thyroid  US  (ref: J Am Coll Radiol. 2015 Feb;12(2): 143-50). 8. Aortic atherosclerosis. Aortic Atherosclerosis (ICD10-I70.0). Electronically Signed   By: Greig Pique M.D.   On: 05/12/2024 18:50   DG Chest 2  View Result Date: 05/12/2024 CLINICAL DATA:  chest pain EXAM:  CHEST - 2 VIEW COMPARISON:  August 13, 2023 FINDINGS: No focal airspace consolidation, pleural effusion, or pneumothorax. No cardiomegaly. Calcified hilar lymph nodes. Tortuous aorta with aortic atherosclerosis. No acute fracture or destructive lesions. Multilevel thoracic osteophytosis. IMPRESSION: No acute cardiopulmonary abnormality. Electronically Signed   By: Rogelia Myers M.D.   On: 05/12/2024 16:49     Assessment and Plan:  Chest pain Suspicion for ACS is low.  She has been reporting nonexertional, non positional, persistent chest pain since yesterday at 1000. CP associated with respirations but not necessarily sharp.  EKG without any acute ST-T wave changes and does not classically fit or meet criteria for pericarditis and with no pericardial effusions noted on echocardiogram.  Despite persistent pain troponin only 22-21, possibly demand from RVR. EF normal with no wall motion abnormalities, mild LVH, no significant valvular disease. Will check inflammatory markers ESR/CRP.  May be elevated regardless given lymphadenopathy.  Permanent atrial fibrillation Initially presented with RVR with rates in the 130s, weaned off of Cardizem  drip.  Rates are now controlled around 70. Continue with Coumadin , chronically has been on this. Continue Nebivolol  5 mg daily. PTA was on diltiazem  to 360 mg daily, dont think she needs this right now.   Chronic HFpEF Overall looks to be euvolemic without any respiratory symptoms.  I do not think she needs any more IV Lasix , would transition to back to home p.o Lasix  40 mg tomorrow.  Mediastinal, hilar, retroperitoneal and inguinal lymphadenopathy  Leukocytosis 16mm left thyroid  nodule Findings concerning for metastatic disease or lymphoma.  She reports prior history of sarcoid diagnosed on biopsy of her left arm back in 1970.  Pattern of lymphadenopathy not entirely consistent with sarcoid but  still can be considered.  No clear signs of cardiac involvement at this time.  Telemetry benign with no AV blocks or other arrhythmias.  Does not really demonstrate any CHF symptoms.  CVI She has been treated for cellulitis, but likely has some degree of CVI given this is bilateral and with varicose veins noted on exam. Wound care following.  OSA compliant with CPAP  History of DVT/PE- on Coumadin    Risk Assessment/Risk Scores:  CHA2DS2-VASc Score = 5   This indicates a 7.2% annual risk of stroke. The patient's score is based upon: CHF History: 0 HTN History: 1 Diabetes History: 0 Stroke History: 0 Vascular Disease History: 1 Age Score: 2 Gender Score: 1   For questions or updates, please contact [facility] Please consult [email or web address] for contact info under      Signed, Thom LITTIE Sluder, PA-C  05/13/2024 1:59 PM

## 2024-05-13 NOTE — Progress Notes (Signed)
 The patient was seen and examined. Virtual admission from [facility] for A-fib with RVR and acute on chronic CHF with preserved EF likely atypical chest pain from her A-fib she is anticoagulated with Coumadin  for DVT and PE.  Presented with chest pain, troponin's 21>>22, non-acute EKG, negative CTA chest/abd/pelvis other than hilar adenopathy.  Pt is on outpatient antibiotics since 11/25 for B/L lower extremity cellulitis.   EDP discussed with Cardiology fellow on call tonight - they will see her tomorrow.  Agree with current plan.

## 2024-05-13 NOTE — Progress Notes (Signed)
 PHARMACY - ANTICOAGULATION CONSULT NOTE  Pharmacy Consult for warfarin Indication: Afib and h/o VTE  Allergies  Allergen Reactions   Atenolol Other (See Comments)    Depression    Citalopram Hydrobromide Nausea Only and Other (See Comments)    Nausea/Fatigue   Clindamycin /Lincomycin Other (See Comments)    Via IV, has caused a metallic taste in the mouth    Codeine Other (See Comments)    Caused Hyperactivity and Dysphoria   Coenzyme Q10 Itching   Erythromycin Itching, Anxiety and Other (See Comments)    Also with jitters   Motrin [Ibuprofen] Other (See Comments)    Is to not take this   Statins Other (See Comments)    Leg muscle weakness w/ rosuvastatin and simvastatin    Tetracyclines & Related Itching and Other (See Comments)    Also with jitters   Latex Rash   Losartan Potassium Other (See Comments)    Muscle aches     Patient Measurements: Height: 5' 4 (162.6 cm) Weight: 108.8 kg (239 lb 12.8 oz) IBW/kg (Calculated) : 54.7 HEPARIN DW (KG): 80.4  Vital Signs: Temp: 99.1 F (37.3 C) (12/05 0749) Temp Source: Oral (12/05 0749) BP: 114/65 (12/05 0749) Pulse Rate: 82 (12/05 0749)  Labs: Recent Labs    05/12/24 1548 05/12/24 1758 05/13/24 0412  HGB 12.7  --  11.8*  HCT 39.2  --  35.5*  PLT 271  --  263  LABPROT  --  37.2* 33.9*  INR  --  3.6* 3.2*  CREATININE 0.90  --  0.94    Estimated Creatinine Clearance: 52.7 mL/min (by C-G formula based on SCr of 0.94 mg/dL).   Medical History: Past Medical History:  Diagnosis Date   Anxiety    takes Lorazepam  daily as needed   Arthritis    Cancer (HCC)    thyroid    Cataracts, bilateral    immature   Chronic back pain    compressed vertebrae,takes Fosamax weekly   Family history of adverse reaction to anesthesia    granddaughter gets very sick and mean   History of blood transfusion    no abnormal reaction noted   History of bronchitis 05/2015   History of colon polyps    benign   Hyperlipidemia     takes Zetia  daily   Hypertension    takes Diltiazem  and Avapro  daily   Hypothyroidism    takes Synthroid  daily   Ischemic colitis    hx of-many yrs ago   Joint pain    Joint swelling    OSA on CPAP    05-08-12 AHi was 46, RDI  53. titrated to   9 cm water  with 3 cm flex.-nadir 75%   PE (pulmonary embolism)    takes Coumadin  daily   Peripheral edema    Pneumonia 1992   hx of   Sarcoidosis    Shortness of breath    Urinary frequency    Weakness    left leg.Numbness and tingling.Has sciatica   Wheeze    occaionally. Not new    Medications:  Medications Prior to Admission  Medication Sig Dispense Refill Last Dose/Taking   acetaminophen  (TYLENOL ) 325 MG tablet Take 325-650 mg by mouth 2 (two) times daily as needed for mild pain or headache.   Unknown   Carboxymethylcellulose Sodium (REFRESH LIQUIGEL OP) Place 1 drop into both eyes at bedtime.   Past Week   cefTRIAXone  (ROCEPHIN ) 1 g injection Inject 1 g into the muscle once.   05/03/2024  diltiazem  (TIAZAC ) 360 MG 24 hr capsule Take 360 mg by mouth at bedtime.   05/11/2024   fluorouracil (EFUDEX) 5 % cream Apply 1 Application topically at bedtime. On hand   Past Week   furosemide  (LASIX ) 40 MG tablet Take 40 mg by mouth daily.   05/12/2024   irbesartan  (AVAPRO ) 300 MG tablet Take 1 tablet (300 mg total) by mouth daily. 30 tablet 0 05/12/2024   levothyroxine  (SYNTHROID ) 112 MCG tablet Take 112 mcg by mouth every morning.   05/12/2024   LORazepam  (ATIVAN ) 1 MG tablet Take 1.5 mg by mouth See admin instructions. Take 1.5 mg by mouth at bedtime and an additional 1-1.5mg  once a day as needed for anxiety   Past Week   nebivolol  (BYSTOLIC ) 5 MG tablet Take 1 tablet (5 mg total) by mouth daily. (Patient taking differently: Take 5 mg by mouth daily at 12 noon.) 30 tablet 0 05/11/2024   SIMPLY SALINE NA Place 1 spray into both nostrils as needed (for dryness).   Past Week   sulfamethoxazole -trimethoprim  (BACTRIM  DS) 800-160 MG tablet Take 1  tablet by mouth 2 (two) times daily for 7 days. 14 tablet 0 05/11/2024   triamcinolone cream (KENALOG) 0.1 % Apply 1 Application topically See admin instructions. Apply to both legs 2 times a day   Past Week   warfarin (COUMADIN ) 5 MG tablet Take 5 mg by mouth at bedtime.  2 05/11/2024   cephALEXin  (KEFLEX ) 500 MG capsule Take 1 capsule (500 mg total) by mouth 4 (four) times daily for 10 days. (Patient not taking: Reported on 05/13/2024) 40 capsule 0 Not Taking   nystatin  cream (MYCOSTATIN ) Apply 1 Application topically 2 (two) times daily. (Patient not taking: Reported on 05/13/2024)   Not Taking   Scheduled:   nebivolol   5 mg Oral Daily   Warfarin - Pharmacist Dosing Inpatient   Does not apply q1600   Infusions:   diltiazem  (CARDIZEM ) infusion 5 mg/hr (05/13/24 0200)    Assessment: 83yo female c/o CP w/ coughing and deep inspiration, found to be in Afib w/ mild tachycardia and borderline RVR (does have h/o Afib and reports she cannot tell when she goes in and out of Afib), to continue warfarin for Afib and h/o VTE;   -INR 3.2  Home warfarin regimen: 5mg  po daily (at last OP OV 11/26 INR was slightly above goal at 3.1 but no changes in warfarin regimen given so close to goal).  Goal of Therapy:  INR 2-3   Plan:  -Warfarin 2.5mg  po today -Daily INR  Prentice Poisson, PharmD Clinical Pharmacist **Pharmacist phone directory can now be found on [email or web address] ([email or web address]).  Listed under [facility].

## 2024-05-13 NOTE — Consult Note (Signed)
 Chief Complaint: Patient was seen in consultation today for lymphadenopathy; for biopsy of inguinal lymph node Chief Complaint  Patient presents with   Chest Pain   at the request of Dr DELENA Dare  Supervising Physician: Karalee Beat  Patient Status: [facility] - In-pt  History of Present Illness: [name] is a 83 y.o. female   FULL Code status per pt  Hx afib; PE/DVT- on coumadin  INR 3.2 today HTN; Hypothyroidism; OSA BLE cellulitis Admitted for chest pain; elevated troponin  CT 12/4:  IMPRESSION: 1. No evidence for aortic dissection or aneurysm. 2. Cardiomegaly. 3. Mediastinal, hilar, retroperitoneal and inguinal lymphadenopathy. Findings are worrisome for metastatic disease or lymphoma. 4. Lower anterior abdominal wall subcutaneous stranding and skin thickening. Correlate clinically for cellulitis. 5. Colonic diverticulosis. 6. Moderate-sized fat containing umbilical hernia. 7. 16 mm left thyroid  nodule, indeterminate. Recommend thyroid  US  (ref: J Am Coll Radiol. 2015 Feb;12(2): 143-50). 8. Aortic atherosclerosis.  Request made for lymph node biopsy Approved with Dr Karalee  Planned for 12/8 in IR  Past Medical History:  Diagnosis Date   Anxiety    takes Lorazepam  daily as needed   Arthritis    Cancer (HCC)    thyroid    Cataracts, bilateral    immature   Chronic back pain    compressed vertebrae,takes Fosamax weekly   Family history of adverse reaction to anesthesia    granddaughter gets very sick and mean   History of blood transfusion    no abnormal reaction noted   History of bronchitis 05/2015   History of colon polyps    benign   Hyperlipidemia    takes Zetia  daily   Hypertension    takes Diltiazem  and Avapro  daily   Hypothyroidism    takes Synthroid  daily   Ischemic colitis    hx of-many yrs ago   Joint pain    Joint swelling    OSA on CPAP    05-08-12 AHi was 46, RDI  53. titrated to   9 cm water  with 3 cm flex.-nadir 75%    PE (pulmonary embolism)    takes Coumadin  daily   Peripheral edema    Pneumonia 1992   hx of   Sarcoidosis    Shortness of breath    Urinary frequency    Weakness    left leg.Numbness and tingling.Has sciatica   Wheeze    occaionally. Not new    Past Surgical History:  Procedure Laterality Date   ABDOMINAL HYSTERECTOMY     APPENDECTOMY     BREAST BIOPSY     biopsies on right x 2   BREAST CYST ASPIRATION Left    CHOLECYSTECTOMY N/A 11/29/2015   Procedure: LAPAROSCOPIC CHOLECYSTECTOMY;  Surgeon: Jina Nephew, MD;  Location: [facility] OR;  Service: General;  Laterality: N/A;   COLONOSCOPY     DILATION AND CURETTAGE OF UTERUS     LIVER BIOPSY     MELANOMA EXCISION     removed from left leg   scalene surgery  1972   trying to find out what was wrong with her lungs   THYROID  SURGERY      Allergies: Atenolol, Citalopram hydrobromide, Clindamycin /lincomycin, Codeine, Coenzyme q10, Erythromycin, Motrin [ibuprofen], Statins, Tetracyclines & related, Latex, and Losartan potassium  Medications: Prior to Admission medications   Medication Sig Start Date End Date Taking? Authorizing Provider  acetaminophen  (TYLENOL ) 325 MG tablet Take 325-650 mg by mouth 2 (two) times daily as needed for mild pain or headache.   Yes [provider]  Carboxymethylcellulose Sodium (REFRESH LIQUIGEL OP) Place 1 drop into both eyes at bedtime.   Yes [provider]  cefTRIAXone  (ROCEPHIN ) 1 g injection Inject 1 g into the muscle once.   Yes [provider]  diltiazem  (TIAZAC ) 360 MG 24 hr capsule Take 360 mg by mouth at bedtime.   Yes [provider]  fluorouracil (EFUDEX) 5 % cream Apply 1 Application topically at bedtime. On hand 04/15/24  Yes [provider]  furosemide  (LASIX ) 40 MG tablet Take 40 mg by mouth daily.   Yes [provider]  irbesartan  (AVAPRO ) 300 MG tablet Take 1 tablet (300 mg total) by mouth daily. 09/27/22  Yes Sheikh, Omair Latif, DO   levothyroxine  (SYNTHROID ) 112 MCG tablet Take 112 mcg by mouth every morning.   Yes [provider]  LORazepam  (ATIVAN ) 1 MG tablet Take 1.5 mg by mouth See admin instructions. Take 1.5 mg by mouth at bedtime and an additional 1-1.5mg  once a day as needed for anxiety   Yes [provider]  nebivolol  (BYSTOLIC ) 5 MG tablet Take 1 tablet (5 mg total) by mouth daily. Patient taking differently: Take 5 mg by mouth daily at 12 noon. 09/27/22  Yes Sheikh, Omair Latif, DO  SIMPLY SALINE NA Place 1 spray into both nostrils as needed (for dryness).   Yes [provider]  sulfamethoxazole -trimethoprim  (BACTRIM  DS) 800-160 MG tablet Take 1 tablet by mouth 2 (two) times daily for 7 days. 05/08/24 05/15/24 Yes Mound, Darryle BRAVO, FNP  triamcinolone cream (KENALOG) 0.1 % Apply 1 Application topically See admin instructions. Apply to both legs 2 times a day 04/05/21  Yes [provider]  warfarin (COUMADIN ) 5 MG tablet Take 5 mg by mouth at bedtime. 04/25/15  Yes [provider]  cephALEXin  (KEFLEX ) 500 MG capsule Take 1 capsule (500 mg total) by mouth 4 (four) times daily for 10 days. Patient not taking: Reported on 05/13/2024 05/03/24 05/13/24  Teddy Sharper, FNP  nystatin  cream (MYCOSTATIN ) Apply 1 Application topically 2 (two) times daily. Patient not taking: Reported on 05/13/2024 05/12/24   [provider]     Family History  Problem Relation Age of Onset   Breast cancer Mother    CVA Mother    Breast cancer Sister    Breast cancer Sister    Heart attack Neg Hx    Sleep apnea Neg Hx     Social History   Socioeconomic History   Marital status: Married    Spouse name: Not on file   Number of children: Not on file   Years of education: Not on file   Highest education level: Not on file  Occupational History   Occupation: Retired    Associate Professor: RETIRED  Tobacco Use   Smoking status: Never   Smokeless tobacco: Never  Vaping Use   Vaping status:  Never Used  Substance and Sexual Activity   Alcohol  use: No   Drug use: No   Sexual activity: Not on file  Other Topics Concern   Not on file  Social History Narrative   Caffeine: 8 oz soda per day   Social Drivers of Health   Financial Resource Strain: Not on file  Food Insecurity: No Food Insecurity (05/13/2024)   Hunger Vital Sign    Worried About Running Out of Food in the Last Year: Never true    Ran Out of Food in the Last Year: Never true  Transportation Needs: No Transportation Needs (05/13/2024)   PRAPARE -  Administrator, Civil Service (Medical): No    Lack of Transportation (Non-Medical): No  Physical Activity: Not on file  Stress: Not on file  Social Connections: Unknown (05/13/2024)   Social Connection and Isolation Panel    Frequency of Communication with Friends and Family: More than three times a week    Frequency of Social Gatherings with Friends and Family: More than three times a week    Attends Religious Services: Not on Marketing Executive or Organizations: Not on file    Attends Banker Meetings: Not on file    Marital Status: Not on file    Review of Systems: A 12 point ROS discussed and pertinent positives are indicated in the HPI above.  All other systems are negative.  Review of Systems  Constitutional:  Negative for appetite change, fatigue and fever.  Respiratory:  Negative for cough and shortness of breath.   Cardiovascular:  Positive for chest pain.  Gastrointestinal:  Negative for abdominal pain.  Neurological:  Negative for weakness.  Psychiatric/Behavioral:  Negative for behavioral problems and confusion.     Vital Signs: BP 134/77 (BP Location: Left Arm)   Pulse 76   Temp 98.8 F (37.1 C) (Oral)   Resp 20   Ht 5' 4 (1.626 m)   Wt 239 lb 12.8 oz (108.8 kg)   SpO2 95%   BMI 41.16 kg/m   Advance Care Plan: The advanced care plan/surrogate decision maker was discussed at the time of visit and  documented in the medical record.    Physical Exam Vitals reviewed.  Cardiovascular:     Rate and Rhythm: Normal rate and regular rhythm.  Pulmonary:     Effort: Pulmonary effort is normal.     Breath sounds: Normal breath sounds.  Musculoskeletal:        General: Normal range of motion.  Skin:    General: Skin is warm and dry.  Neurological:     General: No focal deficit present.     Mental Status: She is alert and oriented to person, place, and time.  Psychiatric:        Behavior: Behavior normal.     Imaging: ECHOCARDIOGRAM COMPLETE Result Date: 05/13/2024    ECHOCARDIOGRAM REPORT   Patient Name:   [name] Date of Exam: 05/13/2024 Medical Rec #:  [id number]            Height:       64.0 in Accession #:    [id number]           Weight:       239.8 lb Date of Birth:  1941-01-09            BSA:          2.113 m Patient Age:    83 years             BP:           110/52 mmHg Patient Gender: F                    HR:           82 bpm. Exam Location:  Inpatient Procedure: 2D Echo (Both Spectral and Color Flow Doppler were utilized during            procedure). Indications:    Afib  History:        Patient has prior history of Echocardiogram examinations.  Arrythmias:Atrial Fibrillation.  Sonographer:    Norleen Amour Referring Phys: [id number] KELLY A GRIFFITH IMPRESSIONS  1. Left ventricular ejection fraction, by estimation, is 55 to 60%. The left ventricle has normal function. The left ventricle has no regional wall motion abnormalities. There is mild left ventricular hypertrophy. Left ventricular diastolic parameters are indeterminate.  2. Right ventricular systolic function is normal. The right ventricular size is normal. Tricuspid regurgitation signal is inadequate for assessing PA pressure.  3. Left atrial size was moderately dilated.  4. Right atrial size was moderately dilated.  5. The mitral valve is normal in structure. Trivial mitral valve regurgitation. No  evidence of mitral stenosis.  6. The aortic valve is tricuspid. Aortic valve regurgitation is not visualized. Aortic valve sclerosis/calcification is present, without any evidence of aortic stenosis.  7. The inferior vena cava is normal in size with greater than 50% respiratory variability, suggesting right atrial pressure of 3 mmHg. FINDINGS  Left Ventricle: Left ventricular ejection fraction, by estimation, is 55 to 60%. The left ventricle has normal function. The left ventricle has no regional wall motion abnormalities. The left ventricular internal cavity size was normal in size. There is  mild left ventricular hypertrophy. Left ventricular diastolic function could not be evaluated due to atrial fibrillation. Left ventricular diastolic parameters are indeterminate. Right Ventricle: The right ventricular size is normal. Right ventricular systolic function is normal. Tricuspid regurgitation signal is inadequate for assessing PA pressure. The tricuspid regurgitant velocity is 2.02 m/s, and with an assumed right atrial  pressure of 3 mmHg, the estimated right ventricular systolic pressure is 19.3 mmHg. Left Atrium: Left atrial size was moderately dilated. Right Atrium: Right atrial size was moderately dilated. Pericardium: There is no evidence of pericardial effusion. Mitral Valve: The mitral valve is normal in structure. Trivial mitral valve regurgitation. No evidence of mitral valve stenosis. MV peak gradient, 5.8 mmHg. The mean mitral valve gradient is 2.0 mmHg. Tricuspid Valve: The tricuspid valve is normal in structure. Tricuspid valve regurgitation is trivial. No evidence of tricuspid stenosis. Aortic Valve: The aortic valve is tricuspid. Aortic valve regurgitation is not visualized. Aortic valve sclerosis/calcification is present, without any evidence of aortic stenosis. Aortic valve mean gradient measures 5.0 mmHg. Aortic valve peak gradient measures 7.6 mmHg. Pulmonic Valve: The pulmonic valve was not well  visualized. Pulmonic valve regurgitation is not visualized. No evidence of pulmonic stenosis. Aorta: The aortic root is normal in size and structure. Venous: The inferior vena cava is normal in size with greater than 50% respiratory variability, suggesting right atrial pressure of 3 mmHg. IAS/Shunts: No atrial level shunt detected by color flow Doppler.  LEFT VENTRICLE PLAX 2D LVIDd:         5.00 cm     Diastology LVIDs:         3.20 cm     LV e' medial:    8.63 cm/s LV PW:         1.30 cm     LV E/e' medial:  13.2 LV IVS:        0.90 cm     LV e' lateral:   10.10 cm/s LVOT diam:     1.90 cm     LV E/e' lateral: 11.3 LV SV:         32 LV SV Index:   15 LVOT Area:     2.84 cm  LV Volumes (MOD) LV vol d, MOD A2C: 93.4 ml LV vol d, MOD A4C: 97.6 ml LV vol s, MOD  A2C: 39.8 ml LV vol s, MOD A4C: 40.2 ml LV SV MOD A2C:     53.6 ml LV SV MOD A4C:     97.6 ml LV SV MOD BP:      58.5 ml RIGHT VENTRICLE             IVC RV Basal diam:  4.00 cm     IVC diam: 1.90 cm RV S prime:     10.10 cm/s TAPSE (M-mode): 1.7 cm      PULMONARY VEINS                             Diastolic Velocity: 63.30 cm/s                             S/D Velocity:       0.40                             Systolic Velocity:  26.80 cm/s LEFT ATRIUM             Index        RIGHT ATRIUM           Index LA diam:        3.90 cm 1.85 cm/m   RA Area:     26.80 cm LA Vol (A2C):   78.5 ml 37.15 ml/m  RA Volume:   89.20 ml  42.21 ml/m LA Vol (A4C):   67.1 ml 31.75 ml/m LA Biplane Vol: 76.4 ml 36.15 ml/m  AORTIC VALVE                     PULMONIC VALVE AV Area (Vmax):    1.30 cm      PV Vmax:       0.68 m/s AV Area (Vmean):   1.17 cm      PV Peak grad:  1.9 mmHg AV Area (VTI):     1.42 cm AV Vmax:           138.00 cm/s AV Vmean:          103.000 cm/s AV VTI:            0.227 m AV Peak Grad:      7.6 mmHg AV Mean Grad:      5.0 mmHg LVOT Vmax:         63.10 cm/s LVOT Vmean:        42.400 cm/s LVOT VTI:          0.114 m LVOT/AV VTI ratio: 0.50  AORTA Ao Root  diam: 2.90 cm Ao Asc diam:  3.00 cm MITRAL VALVE                TRICUSPID VALVE MV Area (PHT): 4.82 cm     TR Peak grad:   16.3 mmHg MV Area VTI:   1.08 cm     TR Vmax:        202.00 cm/s MV Peak grad:  5.8 mmHg MV Mean grad:  2.0 mmHg     SHUNTS MV Vmax:       1.20 m/s     Systemic VTI:  0.11 m MV Vmean:      57.7 cm/s    Systemic Diam: 1.90 cm MV Decel Time: 157 msec MV E velocity: 113.67 cm/s MV A velocity: 45.60 cm/s  MV E/A ratio:  2.49 Redell Shallow MD Electronically signed by Redell Shallow MD Signature Date/Time: 05/13/2024/10:35:19 AM    Final    CT Angio Chest/Abd/Pel for Dissection W and/or Wo Contrast Result Date: 05/12/2024 CLINICAL DATA:  Acute aortic syndrome suspected. EXAM: CT ANGIOGRAPHY CHEST, ABDOMEN AND PELVIS TECHNIQUE: Non-contrast CT of the chest was initially obtained. Multidetector CT imaging through the chest, abdomen and pelvis was performed using the standard protocol during bolus administration of intravenous contrast. Multiplanar reconstructed images and MIPs were obtained and reviewed to evaluate the vascular anatomy. RADIATION DOSE REDUCTION: This exam was performed according to the departmental dose-optimization program which includes automated exposure control, adjustment of the mA and/or kV according to patient size and/or use of iterative reconstruction technique. CONTRAST:  OMNIPAQUE  IOHEXOL  350 MG/ML SOLN COMPARISON:  CT abdomen and pelvis 04/12/2018 FINDINGS: CTA CHEST FINDINGS Cardiovascular: Heart is enlarged. There is satisfactory opacification of the thoracic aorta. The aorta is normal in size. There is no evidence for dissection or aneurysm. There are mild calcified atherosclerotic plaque throughout the aorta and coronary arteries. There is no pericardial effusion. Mediastinum/Nodes: There are enlarged mediastinal and hilar lymph nodes. There is a 16 mm hypodense nodule in the left thyroid . Visualized esophagus is within normal limits. Lungs/Pleura: There are  atelectatic changes in the lung bases and lingula. There is no new focal lung infiltrate, pleural effusion or pneumothorax. There are calcified granulomas in the right lung. Musculoskeletal: Degenerative changes affect the spine. Review of the MIP images confirms the above findings. CTA ABDOMEN AND PELVIS FINDINGS VASCULAR Aorta: Normal caliber aorta without aneurysm, dissection, vasculitis or significant stenosis. There are atherosclerotic calcifications of the aorta. Celiac: Patent without evidence of aneurysm, dissection, vasculitis or significant stenosis. SMA: Patent without evidence of aneurysm, dissection, vasculitis or significant stenosis. Renals: Both renal arteries are patent without evidence of aneurysm, dissection, vasculitis, fibromuscular dysplasia or significant stenosis. IMA: Patent without evidence of aneurysm, dissection, vasculitis or significant stenosis. Inflow: Patent without evidence of aneurysm, dissection, vasculitis or significant stenosis. Veins: No obvious venous abnormality within the limitations of this arterial phase study. Review of the MIP images confirms the above findings. NON-VASCULAR Hepatobiliary: No focal liver abnormality is seen. Status post cholecystectomy. No biliary dilatation. There is a calcified granuloma in the left lobe. Pancreas: Unremarkable. No pancreatic ductal dilatation or surrounding inflammatory changes. Spleen: Normal in size without focal abnormality. Adrenals/Urinary Tract: Adrenal glands are unremarkable. Kidneys are normal, without renal calculi, focal lesion, or hydronephrosis. Bladder is unremarkable. Stomach/Bowel: Stomach is within normal limits. No evidence of bowel wall thickening, distention, or inflammatory changes. There is sigmoid colon diverticulosis. The appendix is not visualized. Lymphatic: There are mildly enlarged and nonenlarged left retroperitoneal lymph nodes measuring up to 1 cm. There are prominent and mildly enlarged iliac chain  lymph nodes measuring up to 1 cm. There are enlarged bilateral inguinal lymph nodes measuring up to 18 mm short axis. Reproductive: Bilateral ovaries are within normal limits. Uterus is surgically absent. Other: There is no ascites or free air. There is a moderate-sized fat containing umbilical hernia. There is lower anterior abdominal wall subcutaneous stranding and skin thickening. Musculoskeletal: No acute or significant osseous findings. Review of the MIP images confirms the above findings. IMPRESSION: 1. No evidence for aortic dissection or aneurysm. 2. Cardiomegaly. 3. Mediastinal, hilar, retroperitoneal and inguinal lymphadenopathy. Findings are worrisome for metastatic disease or lymphoma. 4. Lower anterior abdominal wall subcutaneous stranding and skin thickening. Correlate clinically for cellulitis. 5. Colonic diverticulosis. 6. Moderate-sized  fat containing umbilical hernia. 7. 16 mm left thyroid  nodule, indeterminate. Recommend thyroid  US  (ref: J Am Coll Radiol. 2015 Feb;12(2): 143-50). 8. Aortic atherosclerosis. Aortic Atherosclerosis (ICD10-I70.0). Electronically Signed   By: Greig Pique M.D.   On: 05/12/2024 18:50   DG Chest 2 View Result Date: 05/12/2024 CLINICAL DATA:  chest pain EXAM: CHEST - 2 VIEW COMPARISON:  August 13, 2023 FINDINGS: No focal airspace consolidation, pleural effusion, or pneumothorax. No cardiomegaly. Calcified hilar lymph nodes. Tortuous aorta with aortic atherosclerosis. No acute fracture or destructive lesions. Multilevel thoracic osteophytosis. IMPRESSION: No acute cardiopulmonary abnormality. Electronically Signed   By: Rogelia Myers M.D.   On: 05/12/2024 16:49    Labs:  CBC: Recent Labs    08/13/23 1046 05/08/24 1309 05/12/24 1548 05/13/24 0412  WBC 9.4 10.2 11.2* 15.8*  HGB 13.1 13.3 12.7 11.8*  HCT 40.9 42.0 39.2 35.5*  PLT 204 223 271 263    COAGS: Recent Labs    08/13/23 1057 05/12/24 1758 05/13/24 0412  INR 2.3* 3.6* 3.2*     BMP: Recent Labs    08/13/23 1057 05/08/24 1309 05/12/24 1548 05/13/24 0412  NA 140 138 134* 136  K 3.4* 4.6 4.2 3.9  CL 104 100 97* 97*  CO2 25 21 26 30   GLUCOSE 141* 121* 183* 141*  BUN 16 13 16 13   CALCIUM  9.2 9.2 9.6 8.6*  CREATININE 0.81 0.75 0.90 0.94  GFRNONAA >60  --  >60 59*    LIVER FUNCTION TESTS: Recent Labs    08/13/23 1057  BILITOT 0.7  AST 20  ALT 20  ALKPHOS 85  PROT 8.1  ALBUMIN 3.2*    TUMOR MARKERS: No results for input(s): AFPTM, CEA, CA199, CHROMGRNA in the last 8760 hours.  Assessment and Plan:  Scheduled for inguinal lymph node biopsy 12/8 in IR INR and coumadin  is fine--- no need to hold per Dr Karalee Risks and benefits of inguinal lymph node bx was discussed with the patient and/or patient's family including, but not limited to bleeding, infection, damage to adjacent structures or low yield requiring additional tests.  All of the questions were answered and there is agreement to proceed.  Consent signed and in chart.  Thank you for this interesting consult.  I greatly enjoyed meeting Zulema T Placke and look forward to participating in their care.  A copy of this report was sent to the requesting provider on this date.  Electronically Signed: Sharlet DELENA Candle, PA-C 05/13/2024, 2:40 PM   I spent a total of 20 Minutes    in face to face in clinical consultation, greater than 50% of which was counseling/coordinating care for inguinal lymph node biopsy

## 2024-05-13 NOTE — Progress Notes (Signed)
 PROGRESS NOTE    [name]  FMW:[id number] DOB: 08/08/1940 DOA: 05/12/2024 PCP: [name], MD    Brief Narrative:  83 year old with history of atrial fibrillation, DVT, PE on Coumadin , HTN, hypothyroidism, OSA on nocturnal CPAP currently on antibiotic for lower extremity cellulitis since 11/25 diagnosed at urgent care comes to the hospital for evaluation of chest pain especially worse with deep inspiration.  CTA chest negative for acute pathology/PE.  There is evidence of hilar/inguinal lymphadenopathy.  Due to slight rise in troponin along with atrial fibrillation with RVR, cardiology team was consulted.  IR consulted for inguinal LN biopsy.   Assessment & Plan:   * Atrial fibrillation with RVR (HCC) Permanent atrial fibrillation Atypical chest pain with elevated troponin Initial heart rate in 130s started on Cardizem  drip, continuing Coumadin  and Nebivolol .  EDP has consulted cardiology who will see the patient.  Echocardiogram ordered.  Monitor electrolytes and replete as appropriate.  Check TSH.  Hopefully we can off Cardizem  drip shortly -Trend troponins for now   Acute on chronic heart failure with preserved ejection fraction (HFpEF) (HCC) Required low-dose IV Lasix  in the ER.  Will give 1 more dose   Bilateral lower extremity cellulitis Started on antibiotics outpatient on 11/25.  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  Discussed with IR who plan on doing inguinal lymph node biopsy on Monday.  Patient agreeable   OSA (obstructive sleep apnea) Nightly CPAP ordered  16mm left thyroid  nodule - Will order thyroid  ultrasound.  History of right hemithyroidectomy  Preadmission med rec pending.  Pharmacy notified.  DVT  prophylaxis: Coumadin       Code Status: Full Code Family Communication:   Status is: Inpatient Remains inpatient appropriate because: Continue hospital stay for management for atrial fibrillation with RVR and inguinal lymph node biopsy   PT Follow up Recs:   Subjective:  Seen at bedside no new complaints.  Denies having any prior history of enlarged lymph nodes  Examination:  General exam: Appears calm and comfortable  Respiratory system: Clear to auscultation. Respiratory effort normal. Cardiovascular system: S1 & S2 heard, RRR. No JVD, murmurs, rubs, gallops or clicks. No pedal edema. Gastrointestinal system: Abdomen is nondistended, soft and nontender. No organomegaly or masses felt. Normal bowel sounds heard. Central nervous system: Alert and oriented. No focal neurological deficits. Extremities: Symmetric 5 x 5 power. Skin: No rashes, lesions or ulcers Psychiatry: Judgement and insight appear normal. Mood & affect appropriate.                Diet Orders (From admission, onward)     Start     Ordered   05/12/24 2334  Diet Heart Room service appropriate? Yes; Fluid consistency: Thin  Diet effective now       Question Answer Comment  Room service appropriate? Yes   Fluid consistency: Thin      05/12/24 2334            Objective: Vitals:   05/13/24 0100 05/13/24 0409 05/13/24 0749 05/13/24 1216  BP:  (!) 110/52 114/65 134/77  Pulse: (!) 104 82 82 76  Resp:  20 18 20   Temp:   99.1 F (37.3 C) 98.8 F (37.1 C)  TempSrc:   Oral Oral  SpO2: 96% 94% 95%   Weight:  108.8 kg  Height:        Intake/Output Summary (Last 24 hours) at 05/13/2024 1311 Last data filed at 05/13/2024 1253 Gross per 24 hour  Intake 275.71 ml  Output 1400 ml  Net -1124.29 ml   Filed Weights   05/12/24 1546 05/13/24 0409  Weight: 108.4 kg 108.8 kg    Scheduled Meds:  furosemide   20 mg Intravenous Once   nebivolol   5 mg Oral Daily   warfarin  2.5 mg Oral ONCE-1600    Warfarin - Pharmacist Dosing Inpatient   Does not apply q1600   Continuous Infusions:  diltiazem  (CARDIZEM ) infusion 5 mg/hr (05/13/24 0200)    Nutritional status     Body mass index is 41.16 kg/m.  Data Reviewed:   CBC: Recent Labs  Lab 05/08/24 1309 05/12/24 1548 05/13/24 0412  WBC 10.2 11.2* 15.8*  NEUTROABS 7.3*  --   --   HGB 13.3 12.7 11.8*  HCT 42.0 39.2 35.5*  MCV 94 90.5 91.0  PLT 223 271 263   Basic Metabolic Panel: Recent Labs  Lab 05/08/24 1309 05/12/24 1548 05/13/24 0412  NA 138 134* 136  K 4.6 4.2 3.9  CL 100 97* 97*  CO2 21 26 30   GLUCOSE 121* 183* 141*  BUN 13 16 13   CREATININE 0.75 0.90 0.94  CALCIUM  9.2 9.6 8.6*   GFR: Estimated Creatinine Clearance: 52.7 mL/min (by C-G formula based on SCr of 0.94 mg/dL). Liver Function Tests: No results for input(s): AST, ALT, ALKPHOS, BILITOT, PROT, ALBUMIN in the last 168 hours. No results for input(s): LIPASE, AMYLASE in the last 168 hours. No results for input(s): AMMONIA in the last 168 hours. Coagulation Profile: Recent Labs  Lab 05/12/24 1758 05/13/24 0412  INR 3.6* 3.2*   Cardiac Enzymes: No results for input(s): CKTOTAL, CKMB, CKMBINDEX, TROPONINI in the last 168 hours. BNP (last 3 results) Recent Labs    05/12/24 1806  PROBNP 1,710.0*   HbA1C: No results for input(s): HGBA1C in the last 72 hours. CBG: No results for input(s): GLUCAP in the last 168 hours. Lipid Profile: No results for input(s): CHOL, HDL, LDLCALC, TRIG, CHOLHDL, LDLDIRECT in the last 72 hours. Thyroid  Function Tests: No results for input(s): TSH, T4TOTAL, FREET4, T3FREE, THYROIDAB in the last 72 hours. Anemia Panel: No results for input(s): VITAMINB12, FOLATE, FERRITIN, TIBC, IRON, RETICCTPCT in the last 72 hours. Sepsis Labs: No results for input(s): PROCALCITON, LATICACIDVEN in the last 168 hours.  Recent Results (from the past 240 hours)   Resp panel by RT-PCR (RSV, Flu A&B, Covid) Anterior Nasal Swab     Status: None   Collection Time: 05/12/24  6:04 PM   Specimen: Anterior Nasal Swab  Result Value Ref Range Status   SARS Coronavirus 2 by RT PCR NEGATIVE NEGATIVE Final    Comment: (NOTE) SARS-CoV-2 target nucleic acids are NOT DETECTED.  The SARS-CoV-2 RNA is generally detectable in upper respiratory specimens during the acute phase of infection. The lowest concentration of SARS-CoV-2 viral copies this assay can detect is 138 copies/mL. A negative result does not preclude SARS-Cov-2 infection and should not be used as the sole basis for treatment or other patient management decisions. A negative result may occur with  improper specimen collection/handling, submission of specimen other than nasopharyngeal swab, presence of viral mutation(s) within the areas targeted by this assay, and inadequate number of viral copies(<138 copies/mL). A negative result must be combined with clinical observations, patient history, and epidemiological information. The expected result is Negative.  Fact Sheet for  Patients:  bloggercourse.com  Fact Sheet for Healthcare Providers:  seriousbroker.it  This test is no t yet approved or cleared by the [place]  FDA and  has been authorized for detection and/or diagnosis of SARS-CoV-2 by FDA under an Emergency Use Authorization (EUA). This EUA will remain  in effect (meaning this test can be used) for the duration of the COVID-19 declaration under Section 564(b)(1) of the Act, 21 U.S.C.section 360bbb-3(b)(1), unless the authorization is terminated  or revoked sooner.       Influenza A by PCR NEGATIVE NEGATIVE Final   Influenza B by PCR NEGATIVE NEGATIVE Final    Comment: (NOTE) The Xpert Xpress SARS-CoV-2/FLU/RSV plus assay is intended as an aid in the diagnosis of influenza from Nasopharyngeal swab specimens and should not be used  as a sole basis for treatment. Nasal washings and aspirates are unacceptable for Xpert Xpress SARS-CoV-2/FLU/RSV testing.  Fact Sheet for Patients: bloggercourse.com  Fact Sheet for Healthcare Providers: seriousbroker.it  This test is not yet approved or cleared by the [place]  FDA and has been authorized for detection and/or diagnosis of SARS-CoV-2 by FDA under an Emergency Use Authorization (EUA). This EUA will remain in effect (meaning this test can be used) for the duration of the COVID-19 declaration under Section 564(b)(1) of the Act, 21 U.S.C. section 360bbb-3(b)(1), unless the authorization is terminated or revoked.     Resp Syncytial Virus by PCR NEGATIVE NEGATIVE Final    Comment: (NOTE) Fact Sheet for Patients: bloggercourse.com  Fact Sheet for Healthcare Providers: seriousbroker.it  This test is not yet approved or cleared by the [place]  FDA and has been authorized for detection and/or diagnosis of SARS-CoV-2 by FDA under an Emergency Use Authorization (EUA). This EUA will remain in effect (meaning this test can be used) for the duration of the COVID-19 declaration under Section 564(b)(1) of the Act, 21 U.S.C. section 360bbb-3(b)(1), unless the authorization is terminated or revoked.  Performed at [facility], [address] [place], [place] [address]          Radiology Studies: ECHOCARDIOGRAM COMPLETE Result Date: 05/13/2024    ECHOCARDIOGRAM REPORT   Patient Name:   LASHONDRA VAQUERANO Haring Date of Exam: 05/13/2024 Medical Rec #:  [id number]            Height:       64.0 in Accession #:    [id number]           Weight:       239.8 lb Date of Birth:  1941-02-05            BSA:          2.113 m Patient Age:    83 years             BP:           110/52 mmHg Patient Gender: F                    HR:           82 bpm. Exam Location:  Inpatient  Procedure: 2D Echo (Both Spectral and Color Flow Doppler were utilized during            procedure). Indications:    Afib  History:        Patient has prior history of Echocardiogram examinations.                 Arrythmias:Atrial Fibrillation.  Sonographer:    Norleen Amour  Referring Phys: [id number] KELLY A GRIFFITH IMPRESSIONS  1. Left ventricular ejection fraction, by estimation, is 55 to 60%. The left ventricle has normal function. The left ventricle has no regional wall motion abnormalities. There is mild left ventricular hypertrophy. Left ventricular diastolic parameters are indeterminate.  2. Right ventricular systolic function is normal. The right ventricular size is normal. Tricuspid regurgitation signal is inadequate for assessing PA pressure.  3. Left atrial size was moderately dilated.  4. Right atrial size was moderately dilated.  5. The mitral valve is normal in structure. Trivial mitral valve regurgitation. No evidence of mitral stenosis.  6. The aortic valve is tricuspid. Aortic valve regurgitation is not visualized. Aortic valve sclerosis/calcification is present, without any evidence of aortic stenosis.  7. The inferior vena cava is normal in size with greater than 50% respiratory variability, suggesting right atrial pressure of 3 mmHg. FINDINGS  Left Ventricle: Left ventricular ejection fraction, by estimation, is 55 to 60%. The left ventricle has normal function. The left ventricle has no regional wall motion abnormalities. The left ventricular internal cavity size was normal in size. There is  mild left ventricular hypertrophy. Left ventricular diastolic function could not be evaluated due to atrial fibrillation. Left ventricular diastolic parameters are indeterminate. Right Ventricle: The right ventricular size is normal. Right ventricular systolic function is normal. Tricuspid regurgitation signal is inadequate for assessing PA pressure. The tricuspid regurgitant velocity is 2.02 m/s, and with  an assumed right atrial  pressure of 3 mmHg, the estimated right ventricular systolic pressure is 19.3 mmHg. Left Atrium: Left atrial size was moderately dilated. Right Atrium: Right atrial size was moderately dilated. Pericardium: There is no evidence of pericardial effusion. Mitral Valve: The mitral valve is normal in structure. Trivial mitral valve regurgitation. No evidence of mitral valve stenosis. MV peak gradient, 5.8 mmHg. The mean mitral valve gradient is 2.0 mmHg. Tricuspid Valve: The tricuspid valve is normal in structure. Tricuspid valve regurgitation is trivial. No evidence of tricuspid stenosis. Aortic Valve: The aortic valve is tricuspid. Aortic valve regurgitation is not visualized. Aortic valve sclerosis/calcification is present, without any evidence of aortic stenosis. Aortic valve mean gradient measures 5.0 mmHg. Aortic valve peak gradient measures 7.6 mmHg. Pulmonic Valve: The pulmonic valve was not well visualized. Pulmonic valve regurgitation is not visualized. No evidence of pulmonic stenosis. Aorta: The aortic root is normal in size and structure. Venous: The inferior vena cava is normal in size with greater than 50% respiratory variability, suggesting right atrial pressure of 3 mmHg. IAS/Shunts: No atrial level shunt detected by color flow Doppler.  LEFT VENTRICLE PLAX 2D LVIDd:         5.00 cm     Diastology LVIDs:         3.20 cm     LV e' medial:    8.63 cm/s LV PW:         1.30 cm     LV E/e' medial:  13.2 LV IVS:        0.90 cm     LV e' lateral:   10.10 cm/s LVOT diam:     1.90 cm     LV E/e' lateral: 11.3 LV SV:         32 LV SV Index:   15 LVOT Area:     2.84 cm  LV Volumes (MOD) LV vol d, MOD A2C: 93.4 ml LV vol d, MOD A4C: 97.6 ml LV vol s, MOD A2C: 39.8 ml LV vol s, MOD A4C: 40.2 ml  LV SV MOD A2C:     53.6 ml LV SV MOD A4C:     97.6 ml LV SV MOD BP:      58.5 ml RIGHT VENTRICLE             IVC RV Basal diam:  4.00 cm     IVC diam: 1.90 cm RV S prime:     10.10 cm/s TAPSE  (M-mode): 1.7 cm      PULMONARY VEINS                             Diastolic Velocity: 63.30 cm/s                             S/D Velocity:       0.40                             Systolic Velocity:  26.80 cm/s LEFT ATRIUM             Index        RIGHT ATRIUM           Index LA diam:        3.90 cm 1.85 cm/m   RA Area:     26.80 cm LA Vol (A2C):   78.5 ml 37.15 ml/m  RA Volume:   89.20 ml  42.21 ml/m LA Vol (A4C):   67.1 ml 31.75 ml/m LA Biplane Vol: 76.4 ml 36.15 ml/m  AORTIC VALVE                     PULMONIC VALVE AV Area (Vmax):    1.30 cm      PV Vmax:       0.68 m/s AV Area (Vmean):   1.17 cm      PV Peak grad:  1.9 mmHg AV Area (VTI):     1.42 cm AV Vmax:           138.00 cm/s AV Vmean:          103.000 cm/s AV VTI:            0.227 m AV Peak Grad:      7.6 mmHg AV Mean Grad:      5.0 mmHg LVOT Vmax:         63.10 cm/s LVOT Vmean:        42.400 cm/s LVOT VTI:          0.114 m LVOT/AV VTI ratio: 0.50  AORTA Ao Root diam: 2.90 cm Ao Asc diam:  3.00 cm MITRAL VALVE                TRICUSPID VALVE MV Area (PHT): 4.82 cm     TR Peak grad:   16.3 mmHg MV Area VTI:   1.08 cm     TR Vmax:        202.00 cm/s MV Peak grad:  5.8 mmHg MV Mean grad:  2.0 mmHg     SHUNTS MV Vmax:       1.20 m/s     Systemic VTI:  0.11 m MV Vmean:      57.7 cm/s    Systemic Diam: 1.90 cm MV Decel Time: 157 msec MV E velocity: 113.67 cm/s MV A velocity: 45.60 cm/s MV E/A ratio:  2.49 Redell Shallow MD Electronically signed  by Redell Shallow MD Signature Date/Time: 05/13/2024/10:35:19 AM    Final    CT Angio Chest/Abd/Pel for Dissection W and/or Wo Contrast Result Date: 05/12/2024 CLINICAL DATA:  Acute aortic syndrome suspected. EXAM: CT ANGIOGRAPHY CHEST, ABDOMEN AND PELVIS TECHNIQUE: Non-contrast CT of the chest was initially obtained. Multidetector CT imaging through the chest, abdomen and pelvis was performed using the standard protocol during bolus administration of intravenous contrast. Multiplanar reconstructed images and  MIPs were obtained and reviewed to evaluate the vascular anatomy. RADIATION DOSE REDUCTION: This exam was performed according to the departmental dose-optimization program which includes automated exposure control, adjustment of the mA and/or kV according to patient size and/or use of iterative reconstruction technique. CONTRAST:  OMNIPAQUE  IOHEXOL  350 MG/ML SOLN COMPARISON:  CT abdomen and pelvis 04/12/2018 FINDINGS: CTA CHEST FINDINGS Cardiovascular: Heart is enlarged. There is satisfactory opacification of the thoracic aorta. The aorta is normal in size. There is no evidence for dissection or aneurysm. There are mild calcified atherosclerotic plaque throughout the aorta and coronary arteries. There is no pericardial effusion. Mediastinum/Nodes: There are enlarged mediastinal and hilar lymph nodes. There is a 16 mm hypodense nodule in the left thyroid . Visualized esophagus is within normal limits. Lungs/Pleura: There are atelectatic changes in the lung bases and lingula. There is no new focal lung infiltrate, pleural effusion or pneumothorax. There are calcified granulomas in the right lung. Musculoskeletal: Degenerative changes affect the spine. Review of the MIP images confirms the above findings. CTA ABDOMEN AND PELVIS FINDINGS VASCULAR Aorta: Normal caliber aorta without aneurysm, dissection, vasculitis or significant stenosis. There are atherosclerotic calcifications of the aorta. Celiac: Patent without evidence of aneurysm, dissection, vasculitis or significant stenosis. SMA: Patent without evidence of aneurysm, dissection, vasculitis or significant stenosis. Renals: Both renal arteries are patent without evidence of aneurysm, dissection, vasculitis, fibromuscular dysplasia or significant stenosis. IMA: Patent without evidence of aneurysm, dissection, vasculitis or significant stenosis. Inflow: Patent without evidence of aneurysm, dissection, vasculitis or significant stenosis. Veins: No obvious venous  abnormality within the limitations of this arterial phase study. Review of the MIP images confirms the above findings. NON-VASCULAR Hepatobiliary: No focal liver abnormality is seen. Status post cholecystectomy. No biliary dilatation. There is a calcified granuloma in the left lobe. Pancreas: Unremarkable. No pancreatic ductal dilatation or surrounding inflammatory changes. Spleen: Normal in size without focal abnormality. Adrenals/Urinary Tract: Adrenal glands are unremarkable. Kidneys are normal, without renal calculi, focal lesion, or hydronephrosis. Bladder is unremarkable. Stomach/Bowel: Stomach is within normal limits. No evidence of bowel wall thickening, distention, or inflammatory changes. There is sigmoid colon diverticulosis. The appendix is not visualized. Lymphatic: There are mildly enlarged and nonenlarged left retroperitoneal lymph nodes measuring up to 1 cm. There are prominent and mildly enlarged iliac chain lymph nodes measuring up to 1 cm. There are enlarged bilateral inguinal lymph nodes measuring up to 18 mm short axis. Reproductive: Bilateral ovaries are within normal limits. Uterus is surgically absent. Other: There is no ascites or free air. There is a moderate-sized fat containing umbilical hernia. There is lower anterior abdominal wall subcutaneous stranding and skin thickening. Musculoskeletal: No acute or significant osseous findings. Review of the MIP images confirms the above findings. IMPRESSION: 1. No evidence for aortic dissection or aneurysm. 2. Cardiomegaly. 3. Mediastinal, hilar, retroperitoneal and inguinal lymphadenopathy. Findings are worrisome for metastatic disease or lymphoma. 4. Lower anterior abdominal wall subcutaneous stranding and skin thickening. Correlate clinically for cellulitis. 5. Colonic diverticulosis. 6. Moderate-sized fat containing umbilical hernia. 7. 16 mm left thyroid  nodule,  indeterminate. Recommend thyroid  US  (ref: J Am Coll Radiol. 2015 Feb;12(2):  143-50). 8. Aortic atherosclerosis. Aortic Atherosclerosis (ICD10-I70.0). Electronically Signed   By: Greig Pique M.D.   On: 05/12/2024 18:50   DG Chest 2 View Result Date: 05/12/2024 CLINICAL DATA:  chest pain EXAM: CHEST - 2 VIEW COMPARISON:  August 13, 2023 FINDINGS: No focal airspace consolidation, pleural effusion, or pneumothorax. No cardiomegaly. Calcified hilar lymph nodes. Tortuous aorta with aortic atherosclerosis. No acute fracture or destructive lesions. Multilevel thoracic osteophytosis. IMPRESSION: No acute cardiopulmonary abnormality. Electronically Signed   By: Rogelia Myers M.D.   On: 05/12/2024 16:49           LOS: 0 days   Time spent= 35 mins    Burgess JAYSON Dare, MD [facility]  If 7PM-7AM, please contact night-coverage  05/13/2024, 1:11 PM

## 2024-05-13 NOTE — Consult Note (Signed)
 WOC Nurse Consult Note: Reason for Consult: Right and Left Lower Leg cellulitis Wound type: possible vascular dysfunction Pressure Injury POA: No Wound bed: no denuded sites Drainage small amount of serous fluid on ABD pads that were removed Periwound: atrophic tissue BLE, hemosiderin deposit, yellow streaking anterior aspect of LE, lipo dermatosclerosis, inflammatory redness, dry tissue sloughing; scaling    Dressing completed by WOC, Vashe not available, used NSS, applied moisture cream to feet and heels, wrapped xeroform over discoloration, marked outline of discoloration with black marker, applied rolled gauze, ACE bandage with light stretch. Patient stated she had wound clinic consult once, unclear if she applied compression at home or if she uses any topicals.   Dressing procedure/placement/frequency:  BLE. Cleanse wound with Vashe solution #[id number], do not rinse, pat dry with gauze and apply 1 layer of Xeroform yellow guaze #[id number] over wound, cover with rolled Kerlix gauze, secure in place with tape and apply light stretch ACE bandage. Off-load heels at all times onto pillows.   RLE  12/05  LLE  12/05    WOC will not follow and will remove patient from census task list. Patient may benefit from vascular studies.  Please reconsult if wound worsens in condition and notify provider.   Sherrilyn Hals MSN RN [facility]OCN WOC [facility]  [phone] (Available from 7-3 pm Mon-Friday)

## 2024-05-13 NOTE — Hospital Course (Addendum)
 Brief Narrative:  83 year old with history of atrial fibrillation, DVT, PE on Coumadin , HTN, hypothyroidism, OSA on nocturnal CPAP currently on antibiotic for lower extremity cellulitis since 11/25 diagnosed at urgent care comes to the hospital for evaluation of chest pain especially worse with deep inspiration.  CTA chest negative for acute pathology/PE.  There is evidence of hilar/inguinal lymphadenopathy.  Due to slight rise in troponin along with atrial fibrillation with RVR, cardiology team was consulted.  IR consulted for inguinal LN biopsy.   Assessment & Plan:   * Atrial fibrillation with RVR (HCC) Permanent atrial fibrillation Atypical chest pain with elevated troponin Initial heart rate in 130s started on Cardizem  drip, continuing Coumadin  and Nebivolol .  EDP has consulted cardiology who will see the patient.  Echocardiogram ordered.  Monitor electrolytes and replete as appropriate.  Check TSH.  Hopefully we can off Cardizem  drip shortly -Trend troponins for now   Acute on chronic heart failure with preserved ejection fraction (HFpEF) (HCC) Required low-dose IV Lasix  in the ER.  Will give 1 more dose   Bilateral lower extremity cellulitis Started on antibiotics outpatient on 11/25.  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  Discussed with IR who plan on doing inguinal lymph node biopsy on Monday.  Patient agreeable   OSA (obstructive sleep apnea) Nightly CPAP ordered  16mm left thyroid  nodule - Will order thyroid  ultrasound.  History of right hemithyroidectomy  Preadmission med rec pending.  Pharmacy notified.  DVT prophylaxis: Coumadin       Code Status: Full Code Family Communication:   Status is: Inpatient Remains inpatient appropriate  because: Continue hospital stay for management for atrial fibrillation with RVR and inguinal lymph node biopsy   PT Follow up Recs:   Subjective:  Seen at bedside no new complaints.  Denies having any prior history of enlarged lymph nodes  Examination:  General exam: Appears calm and comfortable  Respiratory system: Clear to auscultation. Respiratory effort normal. Cardiovascular system: S1 & S2 heard, RRR. No JVD, murmurs, rubs, gallops or clicks. No pedal edema. Gastrointestinal system: Abdomen is nondistended, soft and nontender. No organomegaly or masses felt. Normal bowel sounds heard. Central nervous system: Alert and oriented. No focal neurological deficits. Extremities: Symmetric 5 x 5 power. Skin: No rashes, lesions or ulcers Psychiatry: Judgement and insight appear normal. Mood & affect appropriate.

## 2024-05-13 NOTE — Progress Notes (Signed)
  Echocardiogram 2D Echocardiogram has been performed.  Norleen ORN Raynold Blankenbaker 05/13/2024, 10:12 AM

## 2024-05-13 NOTE — Plan of Care (Signed)

## 2024-05-14 DIAGNOSIS — I4891 Unspecified atrial fibrillation: Secondary | ICD-10-CM

## 2024-05-14 DIAGNOSIS — R0781 Pleurodynia: Secondary | ICD-10-CM | POA: Diagnosis not present

## 2024-05-14 LAB — CBC WITH DIFFERENTIAL/PLATELET
Abs Immature Granulocytes: 0.07 K/uL (ref 0.00–0.07)
Basophils Absolute: 0.1 K/uL (ref 0.0–0.1)
Basophils Relative: 1 %
Eosinophils Absolute: 0.3 K/uL (ref 0.0–0.5)
Eosinophils Relative: 2 %
HCT: 37.3 % (ref 36.0–46.0)
Hemoglobin: 12.2 g/dL (ref 12.0–15.0)
Immature Granulocytes: 1 %
Lymphocytes Relative: 10 %
Lymphs Abs: 1.2 K/uL (ref 0.7–4.0)
MCH: 30 pg (ref 26.0–34.0)
MCHC: 32.7 g/dL (ref 30.0–36.0)
MCV: 91.6 fL (ref 80.0–100.0)
Monocytes Absolute: 1.8 K/uL — ABNORMAL HIGH (ref 0.1–1.0)
Monocytes Relative: 15 %
Neutro Abs: 8.7 K/uL — ABNORMAL HIGH (ref 1.7–7.7)
Neutrophils Relative %: 71 %
Platelets: 267 K/uL (ref 150–400)
RBC: 4.07 MIL/uL (ref 3.87–5.11)
RDW: 14.3 % (ref 11.5–15.5)
WBC: 12 K/uL — ABNORMAL HIGH (ref 4.0–10.5)
nRBC: 0 % (ref 0.0–0.2)

## 2024-05-14 LAB — COMPREHENSIVE METABOLIC PANEL WITH GFR
ALT: 20 U/L (ref 0–44)
AST: 21 U/L (ref 15–41)
Albumin: 2.3 g/dL — ABNORMAL LOW (ref 3.5–5.0)
Alkaline Phosphatase: 80 U/L (ref 38–126)
Anion gap: 8 (ref 5–15)
BUN: 19 mg/dL (ref 8–23)
CO2: 31 mmol/L (ref 22–32)
Calcium: 8.6 mg/dL — ABNORMAL LOW (ref 8.9–10.3)
Chloride: 97 mmol/L — ABNORMAL LOW (ref 98–111)
Creatinine, Ser: 0.93 mg/dL (ref 0.44–1.00)
GFR, Estimated: >60 mL/min (ref >60)
Glucose, Bld: 97 mg/dL (ref 70–99)
Potassium: 3.8 mmol/L (ref 3.5–5.1)
Sodium: 136 mmol/L (ref 135–145)
Total Bilirubin: 1.1 mg/dL (ref 0.0–1.2)
Total Protein: 7.4 g/dL (ref 6.5–8.1)

## 2024-05-14 LAB — MAGNESIUM: Magnesium: 2 mg/dL (ref 1.7–2.4)

## 2024-05-14 LAB — PROTIME-INR
INR: 2.3 — ABNORMAL HIGH (ref 0.8–1.2)
Prothrombin Time: 26.7 s — ABNORMAL HIGH (ref 11.4–15.2)

## 2024-05-14 MED ORDER — LEVOTHYROXINE SODIUM 112 MCG PO TABS
112.0000 ug | ORAL_TABLET | Freq: Every day | ORAL | Status: DC
  Administered 2024-05-14 – 2024-05-16 (×3): 112 ug via ORAL
  Filled 2024-05-14 (×4): qty 1

## 2024-05-14 MED ORDER — WARFARIN SODIUM 5 MG PO TABS
5.0000 mg | ORAL_TABLET | Freq: Once | ORAL | Status: AC
  Administered 2024-05-14: 5 mg via ORAL
  Filled 2024-05-14: qty 1

## 2024-05-14 MED ORDER — DILTIAZEM HCL ER COATED BEADS 180 MG PO CP24
360.0000 mg | ORAL_CAPSULE | Freq: Every day | ORAL | Status: DC
  Administered 2024-05-14 – 2024-05-15 (×2): 360 mg via ORAL
  Filled 2024-05-14 (×2): qty 2

## 2024-05-14 NOTE — Plan of Care (Signed)

## 2024-05-14 NOTE — Progress Notes (Signed)
 Cardiology:  [name]  Subjective:  Denies SSCP, palpitations or Dyspnea Had fever this am   Objective:  Vitals:   05/14/24 0235 05/14/24 0416 05/14/24 0438 05/14/24 0757  BP:  112/67  133/61  Pulse:  92  69  Resp:  18  (!) 22  Temp: 99.1 F (37.3 C) 98.7 F (37.1 C)  98.8 F (37.1 C)  TempSrc:  Oral  Oral  SpO2:  95%  92%  Weight:   108 kg   Height:        Intake/Output from previous day:  Intake/Output Summary (Last 24 hours) at 05/14/2024 1026 Last data filed at 05/14/2024 0436 Gross per 24 hour  Intake 280 ml  Output 2250 ml  Net -1970 ml    Physical Exam:  Obese female Lungs clear Abdomen benign Inguinal lymphadenopathy Plus one edema  Lab Results: Basic Metabolic Panel: Recent Labs    05/13/24 0412 05/14/24 0912  NA 136 136  K 3.9 3.8  CL 97* 97*  CO2 30 31  GLUCOSE 141* 97  BUN 13 19  CREATININE 0.94 0.93  CALCIUM  8.6* 8.6*  MG  --  2.0   Liver Function Tests: Recent Labs    05/14/24 0912  AST 21  ALT 20  ALKPHOS 80  BILITOT 1.1  PROT 7.4  ALBUMIN 2.3*   No results for input(s): LIPASE, AMYLASE in the last 72 hours. CBC: Recent Labs    05/13/24 0412 05/14/24 0912  WBC 15.8* 12.0*  NEUTROABS  --  8.7*  HGB 11.8* 12.2  HCT 35.5* 37.3  MCV 91.0 91.6  PLT 263 267    Thyroid  Function Tests: Recent Labs    05/13/24 1944  TSH 3.485   Anemia Panel: No results for input(s): VITAMINB12, FOLATE, FERRITIN, TIBC, IRON, RETICCTPCT in the last 72 hours.  Imaging: US  THYROID  Result Date: 05/14/2024 CLINICAL DATA:  Incidental on CT. [id number] Nodule of left lobe of thyroid  gland [id number] Reported history of RIGHT hemithyroidectomy, and prior biopsy of a dominant then 2.7 cm LEFT thyroid  nodule (06/12/2010). EXAM: THYROID  ULTRASOUND TECHNIQUE: Ultrasound examination of the thyroid  gland and adjacent soft tissues was performed. COMPARISON:  CT CAP, 05/12/2024. US  Thyroid , 01/31/2016 and 05/29/2010. FINDINGS: Parenchymal  Echotexture: Mildly heterogenous Isthmus: 0.3 cm Right lobe: Surgically absent Left lobe: 3.7 x 2.4 x 2.1 cm _________________________________________________________ Estimated total number of nodules >/= 1 cm: 1 Number of spongiform nodules >/=  2 cm not described below (TR1): 0 Number of mixed cystic and solid nodules >/= 1.5 cm not described below (TR2): 0 _________________________________________________________ Nodule # 1: Location: LEFT; Mid Maximum size: 2.3 cm; Other 2 dimensions: 1.6 x 1.7 cm Composition: solid/almost completely solid (2) Echogenicity: hypoechoic (2) Shape: not taller-than-wide (0) Margins: ill-defined (0) Echogenic foci: punctate echogenic foci (3) This nodule appears morphologically stable for least > 10 years, and was previously biopsied in 06/12/2010. Assuming a benign pathologic diagnosis, repeat sampling and/or dedicated follow-up is not recommended. _________________________________________________________ No residual or recurrent tissue within the RIGHT thyroidectomy bed. additional sub-1 cm LEFT inferior pole cyst does not meet threshold for follow-up nor biopsy per current criteria. No cervical adenopathy or abnormal fluid collection within the imaged neck. IMPRESSION: 1. No residual recurrent tissue within the RIGHT thyroidectomy bed. 2. 2.3 cm LEFT mid thyroid  nodule correlates with lesion seen on comparison CT. This nodule has been stable for > 10 years, and was previously biopsied in 06/12/2010. Assuming a benign pathologic diagnosis, repeat sampling and/or dedicated follow-up is not recommended. The above  is in keeping with the ACR TI-RADS recommendations - J Am Coll Radiol 2017;14:587-595. Electronically Signed   By: Thom Hall M.D.   On: 05/14/2024 07:19   ECHOCARDIOGRAM COMPLETE Result Date: 05/13/2024    ECHOCARDIOGRAM REPORT   Patient Name:   [name] Date of Exam: 05/13/2024 Medical Rec #:  [id number]            Height:       64.0 in Accession #:     [id number]           Weight:       239.8 lb Date of Birth:  16-Sep-1940            BSA:          2.113 m Patient Age:    83 years             BP:           110/52 mmHg Patient Gender: F                    HR:           82 bpm. Exam Location:  Inpatient Procedure: 2D Echo (Both Spectral and Color Flow Doppler were utilized during            procedure). Indications:    Afib  History:        Patient has prior history of Echocardiogram examinations.                 Arrythmias:Atrial Fibrillation.  Sonographer:    Norleen Amour Referring Phys: [id number] KELLY A GRIFFITH IMPRESSIONS  1. Left ventricular ejection fraction, by estimation, is 55 to 60%. The left ventricle has normal function. The left ventricle has no regional wall motion abnormalities. There is mild left ventricular hypertrophy. Left ventricular diastolic parameters are indeterminate.  2. Right ventricular systolic function is normal. The right ventricular size is normal. Tricuspid regurgitation signal is inadequate for assessing PA pressure.  3. Left atrial size was moderately dilated.  4. Right atrial size was moderately dilated.  5. The mitral valve is normal in structure. Trivial mitral valve regurgitation. No evidence of mitral stenosis.  6. The aortic valve is tricuspid. Aortic valve regurgitation is not visualized. Aortic valve sclerosis/calcification is present, without any evidence of aortic stenosis.  7. The inferior vena cava is normal in size with greater than 50% respiratory variability, suggesting right atrial pressure of 3 mmHg. FINDINGS  Left Ventricle: Left ventricular ejection fraction, by estimation, is 55 to 60%. The left ventricle has normal function. The left ventricle has no regional wall motion abnormalities. The left ventricular internal cavity size was normal in size. There is  mild left ventricular hypertrophy. Left ventricular diastolic function could not be evaluated due to atrial fibrillation. Left ventricular diastolic parameters  are indeterminate. Right Ventricle: The right ventricular size is normal. Right ventricular systolic function is normal. Tricuspid regurgitation signal is inadequate for assessing PA pressure. The tricuspid regurgitant velocity is 2.02 m/s, and with an assumed right atrial  pressure of 3 mmHg, the estimated right ventricular systolic pressure is 19.3 mmHg. Left Atrium: Left atrial size was moderately dilated. Right Atrium: Right atrial size was moderately dilated. Pericardium: There is no evidence of pericardial effusion. Mitral Valve: The mitral valve is normal in structure. Trivial mitral valve regurgitation. No evidence of mitral valve stenosis. MV peak gradient, 5.8 mmHg. The mean mitral valve gradient is 2.0 mmHg. Tricuspid Valve: The tricuspid valve is  normal in structure. Tricuspid valve regurgitation is trivial. No evidence of tricuspid stenosis. Aortic Valve: The aortic valve is tricuspid. Aortic valve regurgitation is not visualized. Aortic valve sclerosis/calcification is present, without any evidence of aortic stenosis. Aortic valve mean gradient measures 5.0 mmHg. Aortic valve peak gradient measures 7.6 mmHg. Pulmonic Valve: The pulmonic valve was not well visualized. Pulmonic valve regurgitation is not visualized. No evidence of pulmonic stenosis. Aorta: The aortic root is normal in size and structure. Venous: The inferior vena cava is normal in size with greater than 50% respiratory variability, suggesting right atrial pressure of 3 mmHg. IAS/Shunts: No atrial level shunt detected by color flow Doppler.  LEFT VENTRICLE PLAX 2D LVIDd:         5.00 cm     Diastology LVIDs:         3.20 cm     LV e' medial:    8.63 cm/s LV PW:         1.30 cm     LV E/e' medial:  13.2 LV IVS:        0.90 cm     LV e' lateral:   10.10 cm/s LVOT diam:     1.90 cm     LV E/e' lateral: 11.3 LV SV:         32 LV SV Index:   15 LVOT Area:     2.84 cm  LV Volumes (MOD) LV vol d, MOD A2C: 93.4 ml LV vol d, MOD A4C: 97.6 ml LV  vol s, MOD A2C: 39.8 ml LV vol s, MOD A4C: 40.2 ml LV SV MOD A2C:     53.6 ml LV SV MOD A4C:     97.6 ml LV SV MOD BP:      58.5 ml RIGHT VENTRICLE             IVC RV Basal diam:  4.00 cm     IVC diam: 1.90 cm RV S prime:     10.10 cm/s TAPSE (M-mode): 1.7 cm      PULMONARY VEINS                             Diastolic Velocity: 63.30 cm/s                             S/D Velocity:       0.40                             Systolic Velocity:  26.80 cm/s LEFT ATRIUM             Index        RIGHT ATRIUM           Index LA diam:        3.90 cm 1.85 cm/m   RA Area:     26.80 cm LA Vol (A2C):   78.5 ml 37.15 ml/m  RA Volume:   89.20 ml  42.21 ml/m LA Vol (A4C):   67.1 ml 31.75 ml/m LA Biplane Vol: 76.4 ml 36.15 ml/m  AORTIC VALVE                     PULMONIC VALVE AV Area (Vmax):    1.30 cm      PV Vmax:       0.68 m/s AV Area (Vmean):  1.17 cm      PV Peak grad:  1.9 mmHg AV Area (VTI):     1.42 cm AV Vmax:           138.00 cm/s AV Vmean:          103.000 cm/s AV VTI:            0.227 m AV Peak Grad:      7.6 mmHg AV Mean Grad:      5.0 mmHg LVOT Vmax:         63.10 cm/s LVOT Vmean:        42.400 cm/s LVOT VTI:          0.114 m LVOT/AV VTI ratio: 0.50  AORTA Ao Root diam: 2.90 cm Ao Asc diam:  3.00 cm MITRAL VALVE                TRICUSPID VALVE MV Area (PHT): 4.82 cm     TR Peak grad:   16.3 mmHg MV Area VTI:   1.08 cm     TR Vmax:        202.00 cm/s MV Peak grad:  5.8 mmHg MV Mean grad:  2.0 mmHg     SHUNTS MV Vmax:       1.20 m/s     Systemic VTI:  0.11 m MV Vmean:      57.7 cm/s    Systemic Diam: 1.90 cm MV Decel Time: 157 msec MV E velocity: 113.67 cm/s MV A velocity: 45.60 cm/s MV E/A ratio:  2.49 Redell Shallow MD Electronically signed by Redell Shallow MD Signature Date/Time: 05/13/2024/10:35:19 AM    Final    CT Angio Chest/Abd/Pel for Dissection W and/or Wo Contrast Result Date: 05/12/2024 CLINICAL DATA:  Acute aortic syndrome suspected. EXAM: CT ANGIOGRAPHY CHEST, ABDOMEN AND PELVIS TECHNIQUE:  Non-contrast CT of the chest was initially obtained. Multidetector CT imaging through the chest, abdomen and pelvis was performed using the standard protocol during bolus administration of intravenous contrast. Multiplanar reconstructed images and MIPs were obtained and reviewed to evaluate the vascular anatomy. RADIATION DOSE REDUCTION: This exam was performed according to the departmental dose-optimization program which includes automated exposure control, adjustment of the mA and/or kV according to patient size and/or use of iterative reconstruction technique. CONTRAST:  OMNIPAQUE  IOHEXOL  350 MG/ML SOLN COMPARISON:  CT abdomen and pelvis 04/12/2018 FINDINGS: CTA CHEST FINDINGS Cardiovascular: Heart is enlarged. There is satisfactory opacification of the thoracic aorta. The aorta is normal in size. There is no evidence for dissection or aneurysm. There are mild calcified atherosclerotic plaque throughout the aorta and coronary arteries. There is no pericardial effusion. Mediastinum/Nodes: There are enlarged mediastinal and hilar lymph nodes. There is a 16 mm hypodense nodule in the left thyroid . Visualized esophagus is within normal limits. Lungs/Pleura: There are atelectatic changes in the lung bases and lingula. There is no new focal lung infiltrate, pleural effusion or pneumothorax. There are calcified granulomas in the right lung. Musculoskeletal: Degenerative changes affect the spine. Review of the MIP images confirms the above findings. CTA ABDOMEN AND PELVIS FINDINGS VASCULAR Aorta: Normal caliber aorta without aneurysm, dissection, vasculitis or significant stenosis. There are atherosclerotic calcifications of the aorta. Celiac: Patent without evidence of aneurysm, dissection, vasculitis or significant stenosis. SMA: Patent without evidence of aneurysm, dissection, vasculitis or significant stenosis. Renals: Both renal arteries are patent without evidence of aneurysm, dissection, vasculitis,  fibromuscular dysplasia or significant stenosis. IMA: Patent without evidence of aneurysm, dissection, vasculitis or significant stenosis. Inflow:  Patent without evidence of aneurysm, dissection, vasculitis or significant stenosis. Veins: No obvious venous abnormality within the limitations of this arterial phase study. Review of the MIP images confirms the above findings. NON-VASCULAR Hepatobiliary: No focal liver abnormality is seen. Status post cholecystectomy. No biliary dilatation. There is a calcified granuloma in the left lobe. Pancreas: Unremarkable. No pancreatic ductal dilatation or surrounding inflammatory changes. Spleen: Normal in size without focal abnormality. Adrenals/Urinary Tract: Adrenal glands are unremarkable. Kidneys are normal, without renal calculi, focal lesion, or hydronephrosis. Bladder is unremarkable. Stomach/Bowel: Stomach is within normal limits. No evidence of bowel wall thickening, distention, or inflammatory changes. There is sigmoid colon diverticulosis. The appendix is not visualized. Lymphatic: There are mildly enlarged and nonenlarged left retroperitoneal lymph nodes measuring up to 1 cm. There are prominent and mildly enlarged iliac chain lymph nodes measuring up to 1 cm. There are enlarged bilateral inguinal lymph nodes measuring up to 18 mm short axis. Reproductive: Bilateral ovaries are within normal limits. Uterus is surgically absent. Other: There is no ascites or free air. There is a moderate-sized fat containing umbilical hernia. There is lower anterior abdominal wall subcutaneous stranding and skin thickening. Musculoskeletal: No acute or significant osseous findings. Review of the MIP images confirms the above findings. IMPRESSION: 1. No evidence for aortic dissection or aneurysm. 2. Cardiomegaly. 3. Mediastinal, hilar, retroperitoneal and inguinal lymphadenopathy. Findings are worrisome for metastatic disease or lymphoma. 4. Lower anterior abdominal wall subcutaneous  stranding and skin thickening. Correlate clinically for cellulitis. 5. Colonic diverticulosis. 6. Moderate-sized fat containing umbilical hernia. 7. 16 mm left thyroid  nodule, indeterminate. Recommend thyroid  US  (ref: J Am Coll Radiol. 2015 Feb;12(2): 143-50). 8. Aortic atherosclerosis. Aortic Atherosclerosis (ICD10-I70.0). Electronically Signed   By: Greig Pique M.D.   On: 05/12/2024 18:50   DG Chest 2 View Result Date: 05/12/2024 CLINICAL DATA:  chest pain EXAM: CHEST - 2 VIEW COMPARISON:  August 13, 2023 FINDINGS: No focal airspace consolidation, pleural effusion, or pneumothorax. No cardiomegaly. Calcified hilar lymph nodes. Tortuous aorta with aortic atherosclerosis. No acute fracture or destructive lesions. Multilevel thoracic osteophytosis. IMPRESSION: No acute cardiopulmonary abnormality. Electronically Signed   By: Rogelia Myers M.D.   On: 05/12/2024 16:49    Cardiac Studies:  ECG: afib rate 92    Telemetry:  afib rate 90's   Echo: EF 55-60% normal RV moderate bi atrial enlargement trivial MR and AV sclerosis   Medications:    diltiazem   360 mg Oral QHS   doxycycline   100 mg Oral Q12H   levothyroxine   112 mcg Oral QAC breakfast   nebivolol   5 mg Oral Daily   Warfarin - Pharmacist Dosing Inpatient   Does not apply q1600      Assessment/Plan:   Afib:  rate controlled with oral cardizem  and nebivolol  On coumadin  INR Rx at 2.3 Lymphadenopathy.  Worrisome with fever, diffuse lymphadenopathy with elevated WBC and ESR 121 and CRP 24.8.  ? ALL  She is scheduled for lymph node bipsy Monday Per IR Dr Karalee no need to hold coumadin  for procedure.  Thyroid :  continue synthroid  replacement TSH normal 3.4  Chest Pain R/O no acute ECG changes no rub on exam no effusion on TTE. Pain pleuritic May have some pleuritis associated with what ever process is driving fever and lymphadenopathy Per primary service can Rx with ASA/NSAI's   [name] 05/14/2024, 10:26 AM

## 2024-05-14 NOTE — Progress Notes (Signed)
 Patient spiked a fever of 102.3 overnight. Fever resolved after 650 mg Tylenol  given. Hospitalist paged.

## 2024-05-14 NOTE — Evaluation (Signed)
 Physical Therapy Evaluation Patient Details Name: [name] MRN: [id number] DOB: 08-27-1940 Today's Date: 05/14/2024  History of Present Illness  83 year old F presenting with reproducible chest pain especially worse with deep inspiration, and admitted with A-fib with RVR, atypical chest pain and diffuse adenopathy. PMH of A-fib, DVT and PE on Coumadin , HTN, OSA on CPAP, hypothyroidism and cellulitis for which she is currently on Bactrim .  Clinical Impression  Pt presents with admitting diagnosis above. Pt today was able to ambulate in hallway with RW Mod I. PTA pt was Mod I with RW. Pt received on 2L at 93%. Pt ambulated on RA and satted between 87-91%. Pt returned on 2L at 95%. Pt is currently at baseline and likely need no post acute PT follow up however would benefit from one more session for stair training prior to DC. Pt would benefit from continued mobility with mobility specialist during acute stay.         If plan is discharge home, recommend the following: Help with stairs or ramp for entrance;Assist for transportation   Can travel by private vehicle        Equipment Recommendations None recommended by PT  Recommendations for Other Services       Functional Status Assessment Patient has had a recent decline in their functional status and demonstrates the ability to make significant improvements in function in a reasonable and predictable amount of time.     Precautions / Restrictions Precautions Precautions: Fall Recall of Precautions/Restrictions: Intact Restrictions Weight Bearing Restrictions Per Provider Order: No      Mobility  Bed Mobility Overal bed mobility: Modified Independent                  Transfers Overall transfer level: Modified independent Equipment used: Rolling walker (2 wheels)               General transfer comment: Good hand placement.    Ambulation/Gait Ambulation/Gait assistance: Modified independent  (Device/Increase time) Gait Distance (Feet): 600 Feet Assistive device: Rolling walker (2 wheels) Gait Pattern/deviations: WFL(Within Functional Limits) Gait velocity: decreased     General Gait Details: 1 standing rest break to check SpO2. Pt satting between 87-91% on RA during ambulation.  Stairs            Wheelchair Mobility     Tilt Bed    Modified Rankin (Stroke Patients Only)       Balance Overall balance assessment: Mild deficits observed, not formally tested                                           Pertinent Vitals/Pain Pain Assessment Pain Assessment: 0-10 Pain Score: 8  Pain Location: Chest pain when breathing Pain Descriptors / Indicators: Sore    Home Living Family/patient expects to be discharged to:: Private residence Living Arrangements: Spouse/significant other Available Help at Discharge: Family;Available 24 hours/day Type of Home: House Home Access: Stairs to enter Entrance Stairs-Rails: Right;Left (Pt states that she goes up sideways with R rail) Entrance Stairs-Number of Steps: 5   Home Layout: One level Home Equipment: Agricultural Consultant (2 wheels);Wheelchair - manual;BSC/3in1;Cane - single point;Shower seat;Hand held shower head      Prior Function Prior Level of Function : Independent/Modified Independent             Mobility Comments: Pt reports that recently she has been limited by R hip  pain however mostly Mod I with RW ADLs Comments: Ind     Extremity/Trunk Assessment   Upper Extremity Assessment Upper Extremity Assessment: Overall WFL for tasks assessed    Lower Extremity Assessment Lower Extremity Assessment: Overall WFL for tasks assessed    Cervical / Trunk Assessment Cervical / Trunk Assessment: Normal  Communication   Communication Communication: No apparent difficulties    Cognition Arousal: Alert Behavior During Therapy: WFL for tasks assessed/performed   PT - Cognitive impairments: No  apparent impairments                         Following commands: Intact       Cueing Cueing Techniques: Verbal cues     General Comments General comments (skin integrity, edema, etc.): Pt received on 2L at 93%. Pt ambulated on RA and satted between 87-91%. Pt returned on 2L at 95%.    Exercises     Assessment/Plan    PT Assessment Patient needs continued PT services  PT Problem List Decreased strength;Decreased range of motion;Decreased activity tolerance;Decreased balance;Decreased mobility;Decreased coordination;Decreased safety awareness;Decreased knowledge of use of DME;Cardiopulmonary status limiting activity;Decreased knowledge of precautions       PT Treatment Interventions DME instruction;Gait training;Stair training;Functional mobility training;Therapeutic activities;Therapeutic exercise;Balance training;Neuromuscular re-education;Cognitive remediation;Patient/family education    PT Goals (Current goals can be found in the Care Plan section)  Acute Rehab PT Goals Patient Stated Goal: to go home PT Goal Formulation: With patient Time For Goal Achievement: 05/28/24 Potential to Achieve Goals: Good    Frequency Min 2X/week     Co-evaluation               AM-PAC PT 6 Clicks Mobility  Outcome Measure Help needed turning from your back to your side while in a flat bed without using bedrails?: None Help needed moving from lying on your back to sitting on the side of a flat bed without using bedrails?: None Help needed moving to and from a bed to a chair (including a wheelchair)?: None Help needed standing up from a chair using your arms (e.g., wheelchair or bedside chair)?: None Help needed to walk in hospital room?: None Help needed climbing 3-5 steps with a railing? : A Little 6 Click Score: 23    End of Session Equipment Utilized During Treatment: Gait belt;Oxygen  Activity Tolerance: Patient tolerated treatment well Patient left: in bed;with  call bell/phone within reach;with family/visitor present Nurse Communication: Mobility status PT Visit Diagnosis: Other abnormalities of gait and mobility (R26.89)    Time: 8467-8396 PT Time Calculation (min) (ACUTE ONLY): 31 min   Charges:   PT Evaluation $PT Eval Low Complexity: 1 Low PT Treatments $Gait Training: 8-22 mins PT General Charges $$ ACUTE PT VISIT: 1 Visit         Sueellen NOVAK, PT, DPT Acute Rehab Services [phone]   Aquila Delaughter 05/14/2024, 4:14 PM

## 2024-05-14 NOTE — Progress Notes (Signed)
 PHARMACY - ANTICOAGULATION CONSULT NOTE  Pharmacy Consult for Warfarin Indication: atrial fibrillation and hx PE/DVT  Allergies  Allergen Reactions   Atenolol Other (See Comments)    Depression    Citalopram Hydrobromide Nausea Only and Other (See Comments)    Nausea/Fatigue   Clindamycin /Lincomycin Other (See Comments)    Via IV, has caused a metallic taste in the mouth    Codeine Other (See Comments)    Caused Hyperactivity and Dysphoria   Coenzyme Q10 Itching   Erythromycin Itching, Anxiety and Other (See Comments)    Also with jitters   Motrin [Ibuprofen] Other (See Comments)    Is to not take this   Statins Other (See Comments)    Leg muscle weakness w/ rosuvastatin and simvastatin    Tetracyclines & Related Itching and Other (See Comments)    Also with jitters   Latex Rash   Losartan Potassium Other (See Comments)    Muscle aches     Patient Measurements: Height: 5' 4 (162.6 cm) Weight: 108 kg (238 lb 1.6 oz) IBW/kg (Calculated) : 54.7 HEPARIN DW (KG): 80.4  Vital Signs: Temp: 99 F (37.2 C) (12/06 1149) Temp Source: Oral (12/06 1149) BP: 109/69 (12/06 1149) Pulse Rate: 89 (12/06 1149)  Labs: Recent Labs    05/12/24 1548 05/12/24 1758 05/13/24 0412 05/14/24 0408 05/14/24 0912  HGB 12.7  --  11.8*  --  12.2  HCT 39.2  --  35.5*  --  37.3  PLT 271  --  263  --  267  LABPROT  --  37.2* 33.9* 26.7*  --   INR  --  3.6* 3.2* 2.3*  --   CREATININE 0.90  --  0.94  --  0.93    Estimated Creatinine Clearance: 53.1 mL/min (by C-G formula based on SCr of 0.93 mg/dL).  Assessment: 83 yr old female on warfarin prior to admit for hx PE and DVT. Pharmacy consulted to continue warfarin for atrial fibrillation and hx PE/DVT.   PTA warfarin regimen: 5 mg daily (at last OP OV 11/26 INR was slightly above goal at 3.1 but no changes in warfarin regimen given so close to goal)  INR supratherapeutic (3.6) on admit 12/5 and warfarin held.  INR possibly elevated on  admit due to Septra  course PTA. Now on Doxycycline  thru 12/7 am. INR down to 3.2 on 12/6 and warfarin 2.5 mg given.  Noted plan for inguinal lymph node biopsy on 12/8, and no need to hold warfarin per Dr. Karalee.  Goal of Therapy:  INR 2-3 Monitor platelets by anticoagulation protocol: Yes   Plan:  Warfarin 5 mg x 1 today, usual dose. Daily PT/INR for now.  Genaro Zebedee Calin, RPh 05/14/2024,2:07 PM

## 2024-05-14 NOTE — Progress Notes (Signed)
 PROGRESS NOTE  [name] FMW:[id number] DOB: 12-Oct-1940   PCP: [name], MD  Patient is from: Home.  DOA: 05/12/2024 LOS: 1  Chief complaints Chief Complaint  Patient presents with   Chest Pain     Brief Narrative / Interim history: 83 year old F with PMH of A-fib, DVT and PE on Coumadin , HTN, OSA on CPAP, hypothyroidism and cellulitis for which she is currently on Bactrim  presenting with reproducible chest pain especially worse with deep inspiration, and admitted with A-fib with RVR, atypical chest pain and diffuse adenopathy.  Slightly tachycardic on arrival.  WBC 15.8 with left shift.  proBNP 1710.  CT angio chest, abdomen and pelvis with mediastinal, hilar, retroperitoneal and inguinal adenopathy, indeterminate 16mm left thyroid  nodule and lower anterior abdominal wall subcutaneous stranding and skin thickening.  Patient was started on doxycycline  and Cardizem  drip.  Cardiology consulted for A-fib with RVR and elevated troponin.  IR consulted for diffuse lymphadenopathy.  CRP and ESR markedly elevated.  Patient also spiking fever.  Blood culture obtained.  Subjective: Seen and examined earlier this morning.  No major events overnight or this morning.  Spiked fever to 102.3 last night.  Other vital stable.  Continues to endorse chest pain that is reproducible and worse with deep breathing.  Denies cough.  Denies GI or UTI symptoms.   Assessment and plan: Permanent atrial fibrillation with RVR: RVR resolved.  On warfarin for anticoagulation.  INR therapeutic.  TTE without significant finding. -Discontinue Cardizem  drip and resume home p.o. Cardizem  -Continue home Nebivolol . -Warfarin per pharmacy  Atypical chest pain: Reproducible with palpation suggesting musculoskeletal etiology.  Mild elevation in troponin without delta.  TTE without significant finding.  Likely elevated inflammatory markers. -Supportive care   Chronic HFpEF: TTE without significant finding.   Received IV Lasix  in ED.  Appears euvolemic on exam -Hold off further diuretics. -Monitor fluid and respiratory status   Bilateral lower extremity cellulitis: Was on Bactrim  outpatient since 11/25 SIRS: Due to cellulitis?  CT suggested possible abdominal wall cellulitis but no cellulitis on exam.  She has markedly elevated inflammatory markers and diffuse lymphadenopathy.  Leukocytosis improved just with doxycycline . - Check blood culture to exclude bacteremia -Continue doxycycline  -Continue monitoring   History of PE/DVT/elevated IgM cardiolipin antibody: Lifelong anticoagulation recommended by oncology previously -Continue warfarin.   Diffuse adenopathy, mediastinal, hilar, retroperitoneal and inguinal -Plan for inguinal lymph node biopsy by IR on 12/8.  No need to hold Coumadin .   OSA (obstructive sleep apnea) -CPAP at night   Left thyroid  nodule: CT showed 60 mm left thyroid  nodule.  TSH normal.  Thyroid  ultrasound showed 2.3 cm left mid thyroid  nodule which has been stable over 10 years.  No further evaluation recommended.  Morbid obesity Body mass index is 40.87 kg/m.           DVT prophylaxis:   warfarin (COUMADIN ) tablet 5 mg  Code Status: Full code Family Communication: None at bedside Level of care: Telemetry Status is: Inpatient Remains inpatient appropriate because: SIRS, atrial fibrillation and diffuse lymphadenopathy   Final disposition: Clear home after biopsy.   55 minutes with more than 50% spent in reviewing records, counseling patient/family and coordinating care.  Consultants:  Cardiology Interventional radiology  Procedures: None  Microbiology summarized: COVID-19, influenza and RSV PCR nonreactive Blood cultures pending  Objective: Vitals:   05/14/24 0416 05/14/24 0438 05/14/24 0757 05/14/24 1149  BP: 112/67  133/61 109/69  Pulse: 92  69 89  Resp: 18  (!) 22 17  Temp: 98.7 F (37.1 C)  98.8 F (37.1 C) 99 F (37.2 C)  TempSrc:  Oral  Oral Oral  SpO2: 95%  92% 91%  Weight:  108 kg    Height:        Examination:  GENERAL: No apparent distress.  Nontoxic. HEENT: MMM.  Vision and hearing grossly intact.  NECK: Supple.  No apparent JVD.  RESP:  No IWOB.  Fair aeration bilaterally. CVS:  RRR. Heart sounds normal.  ABD/GI/GU: BS+. Abd soft, NTND.  MSK/EXT:  Moves extremities.  Ace wrap over BLE. SKIN: no apparent skin lesion or wound NEURO: AA.  Oriented appropriately.  No apparent focal neuro deficit. PSYCH: Calm. Normal affect.   Sch Meds:  Scheduled Meds:  diltiazem   360 mg Oral QHS   doxycycline   100 mg Oral Q12H   levothyroxine   112 mcg Oral QAC breakfast   nebivolol   5 mg Oral Daily   warfarin  5 mg Oral ONCE-1600   Warfarin - Pharmacist Dosing Inpatient   Does not apply q1600   Continuous Infusions: PRN Meds:.acetaminophen  **OR** acetaminophen , LORazepam , ondansetron  **OR** ondansetron  (ZOFRAN ) IV, senna-docusate  Antimicrobials: Anti-infectives (From admission, onward)    Start     Dose/Rate Route Frequency Ordered Stop   05/13/24 2200  doxycycline  (VIBRA -TABS) tablet 100 mg        100 mg Oral Every 12 hours 05/13/24 1524 05/15/24 2159        I have personally reviewed the following labs and images: CBC: Recent Labs  Lab 05/08/24 1309 05/12/24 1548 05/13/24 0412 05/14/24 0912  WBC 10.2 11.2* 15.8* 12.0*  NEUTROABS 7.3*  --   --  8.7*  HGB 13.3 12.7 11.8* 12.2  HCT 42.0 39.2 35.5* 37.3  MCV 94 90.5 91.0 91.6  PLT 223 271 263 267   BMP &GFR Recent Labs  Lab 05/08/24 1309 05/12/24 1548 05/13/24 0412 05/14/24 0912  NA 138 134* 136 136  K 4.6 4.2 3.9 3.8  CL 100 97* 97* 97*  CO2 21 26 30 31   GLUCOSE 121* 183* 141* 97  BUN 13 16 13 19   CREATININE 0.75 0.90 0.94 0.93  CALCIUM  9.2 9.6 8.6* 8.6*  MG  --   --   --  2.0   Estimated Creatinine Clearance: 53.1 mL/min (by C-G formula based on SCr of 0.93 mg/dL). Liver & Pancreas: Recent Labs  Lab 05/14/24 0912  AST 21  ALT  20  ALKPHOS 80  BILITOT 1.1  PROT 7.4  ALBUMIN 2.3*   No results for input(s): LIPASE, AMYLASE in the last 168 hours. No results for input(s): AMMONIA in the last 168 hours. Diabetic: No results for input(s): HGBA1C in the last 72 hours. No results for input(s): GLUCAP in the last 168 hours. Cardiac Enzymes: No results for input(s): CKTOTAL, CKMB, CKMBINDEX, TROPONINI in the last 168 hours. Recent Labs    05/12/24 1806  PROBNP 1,710.0*   Coagulation Profile: Recent Labs  Lab 05/12/24 1758 05/13/24 0412 05/14/24 0408  INR 3.6* 3.2* 2.3*   Thyroid  Function Tests: Recent Labs    05/13/24 1944  TSH 3.485   Lipid Profile: No results for input(s): CHOL, HDL, LDLCALC, TRIG, CHOLHDL, LDLDIRECT in the last 72 hours. Anemia Panel: No results for input(s): VITAMINB12, FOLATE, FERRITIN, TIBC, IRON, RETICCTPCT in the last 72 hours. Urine analysis:    Component Value Date/Time   COLORURINE COLORLESS (A) 09/20/2022 1812   APPEARANCEUR CLEAR 09/20/2022 1812   LABSPEC 1.005 09/20/2022 1812   PHURINE 5.0 09/20/2022  1812   GLUCOSEU NEGATIVE 09/20/2022 1812   HGBUR NEGATIVE 09/20/2022 1812   BILIRUBINUR NEGATIVE 09/20/2022 1812   KETONESUR NEGATIVE 09/20/2022 1812   PROTEINUR NEGATIVE 09/20/2022 1812   UROBILINOGEN 0.2 06/02/2007 1024   NITRITE NEGATIVE 09/20/2022 1812   LEUKOCYTESUR NEGATIVE 09/20/2022 1812   Sepsis Labs: Invalid input(s): PROCALCITONIN, LACTICIDVEN  Microbiology: Recent Results (from the past 240 hours)  Resp panel by RT-PCR (RSV, Flu A&B, Covid) Anterior Nasal Swab     Status: None   Collection Time: 05/12/24  6:04 PM   Specimen: Anterior Nasal Swab  Result Value Ref Range Status   SARS Coronavirus 2 by RT PCR NEGATIVE NEGATIVE Final    Comment: (NOTE) SARS-CoV-2 target nucleic acids are NOT DETECTED.  The SARS-CoV-2 RNA is generally detectable in upper respiratory specimens during the acute phase of  infection. The lowest concentration of SARS-CoV-2 viral copies this assay can detect is 138 copies/mL. A negative result does not preclude SARS-Cov-2 infection and should not be used as the sole basis for treatment or other patient management decisions. A negative result may occur with  improper specimen collection/handling, submission of specimen other than nasopharyngeal swab, presence of viral mutation(s) within the areas targeted by this assay, and inadequate number of viral copies(<138 copies/mL). A negative result must be combined with clinical observations, patient history, and epidemiological information. The expected result is Negative.  Fact Sheet for Patients:  bloggercourse.com  Fact Sheet for Healthcare Providers:  seriousbroker.it  This test is no t yet approved or cleared by the [place]  FDA and  has been authorized for detection and/or diagnosis of SARS-CoV-2 by FDA under an Emergency Use Authorization (EUA). This EUA will remain  in effect (meaning this test can be used) for the duration of the COVID-19 declaration under Section 564(b)(1) of the Act, 21 U.S.C.section 360bbb-3(b)(1), unless the authorization is terminated  or revoked sooner.       Influenza A by PCR NEGATIVE NEGATIVE Final   Influenza B by PCR NEGATIVE NEGATIVE Final    Comment: (NOTE) The Xpert Xpress SARS-CoV-2/FLU/RSV plus assay is intended as an aid in the diagnosis of influenza from Nasopharyngeal swab specimens and should not be used as a sole basis for treatment. Nasal washings and aspirates are unacceptable for Xpert Xpress SARS-CoV-2/FLU/RSV testing.  Fact Sheet for Patients: bloggercourse.com  Fact Sheet for Healthcare Providers: seriousbroker.it  This test is not yet approved or cleared by the [place]  FDA and has been authorized for detection and/or diagnosis of SARS-CoV-2  by FDA under an Emergency Use Authorization (EUA). This EUA will remain in effect (meaning this test can be used) for the duration of the COVID-19 declaration under Section 564(b)(1) of the Act, 21 U.S.C. section 360bbb-3(b)(1), unless the authorization is terminated or revoked.     Resp Syncytial Virus by PCR NEGATIVE NEGATIVE Final    Comment: (NOTE) Fact Sheet for Patients: bloggercourse.com  Fact Sheet for Healthcare Providers: seriousbroker.it  This test is not yet approved or cleared by the [place]  FDA and has been authorized for detection and/or diagnosis of SARS-CoV-2 by FDA under an Emergency Use Authorization (EUA). This EUA will remain in effect (meaning this test can be used) for the duration of the COVID-19 declaration under Section 564(b)(1) of the Act, 21 U.S.C. section 360bbb-3(b)(1), unless the authorization is terminated or revoked.  Performed at [facility], [address], [place], [place] [address]     Radiology Studies: US  THYROID  Result Date: 05/14/2024 CLINICAL DATA:  Incidental on CT. [id number] Nodule of left lobe of thyroid  gland [id number] Reported history of RIGHT hemithyroidectomy, and prior biopsy of a dominant then 2.7 cm LEFT thyroid  nodule (06/12/2010). EXAM: THYROID  ULTRASOUND TECHNIQUE: Ultrasound examination of the thyroid  gland and adjacent soft tissues was performed. COMPARISON:  CT CAP, 05/12/2024. US  Thyroid , 01/31/2016 and 05/29/2010. FINDINGS: Parenchymal Echotexture: Mildly heterogenous Isthmus: 0.3 cm Right lobe: Surgically absent Left lobe: 3.7 x 2.4 x 2.1 cm _________________________________________________________ Estimated total number of nodules >/= 1 cm: 1 Number of spongiform nodules >/=  2 cm not described below (TR1): 0 Number of mixed cystic and solid nodules >/= 1.5 cm not described below (TR2): 0 _________________________________________________________ Nodule # 1:  Location: LEFT; Mid Maximum size: 2.3 cm; Other 2 dimensions: 1.6 x 1.7 cm Composition: solid/almost completely solid (2) Echogenicity: hypoechoic (2) Shape: not taller-than-wide (0) Margins: ill-defined (0) Echogenic foci: punctate echogenic foci (3) This nodule appears morphologically stable for least > 10 years, and was previously biopsied in 06/12/2010. Assuming a benign pathologic diagnosis, repeat sampling and/or dedicated follow-up is not recommended. _________________________________________________________ No residual or recurrent tissue within the RIGHT thyroidectomy bed. additional sub-1 cm LEFT inferior pole cyst does not meet threshold for follow-up nor biopsy per current criteria. No cervical adenopathy or abnormal fluid collection within the imaged neck. IMPRESSION: 1. No residual recurrent tissue within the RIGHT thyroidectomy bed. 2. 2.3 cm LEFT mid thyroid  nodule correlates with lesion seen on comparison CT. This nodule has been stable for > 10 years, and was previously biopsied in 06/12/2010. Assuming a benign pathologic diagnosis, repeat sampling and/or dedicated follow-up is not recommended. The above is in keeping with the ACR TI-RADS recommendations - J Am Coll Radiol 2017;14:587-595. Electronically Signed   By: Thom Hall M.D.   On: 05/14/2024 07:19      Deano Tomaszewski T. Kenlynn Houde [facility]  If 7PM-7AM, please contact night-coverage [email or web address] 05/14/2024, 2:34 PM

## 2024-05-15 DIAGNOSIS — R0781 Pleurodynia: Secondary | ICD-10-CM | POA: Diagnosis not present

## 2024-05-15 DIAGNOSIS — I4891 Unspecified atrial fibrillation: Secondary | ICD-10-CM | POA: Diagnosis not present

## 2024-05-15 LAB — RENAL FUNCTION PANEL
Albumin: 2 g/dL — ABNORMAL LOW (ref 3.5–5.0)
Anion gap: 8 (ref 5–15)
BUN: 21 mg/dL (ref 8–23)
CO2: 29 mmol/L (ref 22–32)
Calcium: 8.1 mg/dL — ABNORMAL LOW (ref 8.9–10.3)
Chloride: 100 mmol/L (ref 98–111)
Creatinine, Ser: 0.89 mg/dL (ref 0.44–1.00)
GFR, Estimated: >60 mL/min (ref >60)
Glucose, Bld: 99 mg/dL (ref 70–99)
Phosphorus: 3.7 mg/dL (ref 2.5–4.6)
Potassium: 3.7 mmol/L (ref 3.5–5.1)
Sodium: 137 mmol/L (ref 135–145)

## 2024-05-15 LAB — CBC
HCT: 35.1 % — ABNORMAL LOW (ref 36.0–46.0)
Hemoglobin: 11.2 g/dL — ABNORMAL LOW (ref 12.0–15.0)
MCH: 29.6 pg (ref 26.0–34.0)
MCHC: 31.9 g/dL (ref 30.0–36.0)
MCV: 92.6 fL (ref 80.0–100.0)
Platelets: 245 K/uL (ref 150–400)
RBC: 3.79 MIL/uL — ABNORMAL LOW (ref 3.87–5.11)
RDW: 14.4 % (ref 11.5–15.5)
WBC: 11.1 K/uL — ABNORMAL HIGH (ref 4.0–10.5)
nRBC: 0 % (ref 0.0–0.2)

## 2024-05-15 LAB — MAGNESIUM: Magnesium: 2.1 mg/dL (ref 1.7–2.4)

## 2024-05-15 LAB — PROTIME-INR
INR: 1.9 — ABNORMAL HIGH (ref 0.8–1.2)
Prothrombin Time: 22.5 s — ABNORMAL HIGH (ref 11.4–15.2)

## 2024-05-15 MED ORDER — WARFARIN SODIUM 5 MG PO TABS
5.0000 mg | ORAL_TABLET | Freq: Once | ORAL | Status: AC
  Administered 2024-05-15: 5 mg via ORAL
  Filled 2024-05-15: qty 1

## 2024-05-15 MED ORDER — DOXYCYCLINE HYCLATE 100 MG PO TABS
100.0000 mg | ORAL_TABLET | Freq: Two times a day (BID) | ORAL | Status: DC
  Administered 2024-05-15 – 2024-05-16 (×2): 100 mg via ORAL
  Filled 2024-05-15 (×2): qty 1

## 2024-05-15 NOTE — Progress Notes (Addendum)
 PHARMACY - ANTICOAGULATION CONSULT NOTE  Pharmacy Consult for Warfarin Indication: atrial fibrillation and hx PE/DVT  Allergies  Allergen Reactions   Atenolol Other (See Comments)    Depression    Citalopram Hydrobromide Nausea Only and Other (See Comments)    Nausea/Fatigue   Clindamycin /Lincomycin Other (See Comments)    Via IV, has caused a metallic taste in the mouth    Codeine Other (See Comments)    Caused Hyperactivity and Dysphoria   Coenzyme Q10 Itching   Erythromycin Itching, Anxiety and Other (See Comments)    Also with jitters   Motrin [Ibuprofen] Other (See Comments)    Is to not take this   Statins Other (See Comments)    Leg muscle weakness w/ rosuvastatin and simvastatin    Tetracyclines & Related Itching and Other (See Comments)    Also with jitters   Latex Rash   Losartan Potassium Other (See Comments)    Muscle aches     Patient Measurements: Height: 5' 4 (162.6 cm) Weight: 106.3 kg (234 lb 4.8 oz) IBW/kg (Calculated) : 54.7 HEPARIN DW (KG): 80.4  Vital Signs: Temp: 98.9 F (37.2 C) (12/07 0857) Temp Source: Oral (12/07 0857) BP: 124/74 (12/07 0857) Pulse Rate: 83 (12/07 0857)  Labs: Recent Labs    05/13/24 0412 05/14/24 0408 05/14/24 0912 05/15/24 0458  HGB 11.8*  --  12.2 11.2*  HCT 35.5*  --  37.3 35.1*  PLT 263  --  267 245  LABPROT 33.9* 26.7*  --  22.5*  INR 3.2* 2.3*  --  1.9*  CREATININE 0.94  --  0.93 0.89    Estimated Creatinine Clearance: 54.9 mL/min (by C-G formula based on SCr of 0.89 mg/dL).  Assessment: 83 yr old female on warfarin prior to admit for hx PE and DVT. Pharmacy consulted to continue warfarin for atrial fibrillation and hx PE/DVT.   PTA warfarin regimen: 5 mg daily (at last OP OV 11/26 INR was slightly above goal at 3.1 but no changes in warfarin regimen given so close to goal)  12/4: INR supratherapeutic (3.6) on admit and warfarin held.  INR possibly elevated on admit due to Septra  course PTA. To  continue Doxycycline  for 5 more days. 12/5: INR 3.2 > warfarin 2.5 mg given 12/6: INR 2.3 > warfarin 5 mg given 12/7: INR 1.9   Noted plan for inguinal lymph node biopsy on 12/8, and no need to hold warfarin per Dr. Karalee.  Goal of Therapy:  INR 2-3 Monitor platelets by anticoagulation protocol: Yes   Plan:  Warfarin 5 mg x 1 again today, usual dose. Will consider increasing dose on 12/8 if INR trends down further. Daily PT/INR for now.  Genaro Zebedee Calin, RPh 05/15/2024,12:09 PM

## 2024-05-15 NOTE — Evaluation (Signed)
 Occupational Therapy Evaluation/Discharge Patient Details Name: [name] MRN: [id number] DOB: 1940/11/21 Today's Date: 05/15/2024   History of Present Illness   83 year old admitted with A-fib with RVR, atypical chest pain and diffuse adenopathy. PMH of A-fib, DVT and PE on Coumadin , HTN, OSA on CPAP, hypothyroidism and cellulitis for which she is currently on Bactrim .     Clinical Impressions PTA, pt lives with spouse, typically Modified Independent with ADLs, IADLs and mobility using RW. Pt presents now at baseline for ADLs/mobility with assist only needed for lines. Pt able to manage ADLs seated EOB, standing at sink and mobility in room using RW without physical assistance or safety concerns. SpO2 90% on RA during eval. As pt at baseline for ADLs, no further skilled OT services needed. Recommend pt to work with mobility specialists while admitted. Please reconsult if needs change.     If plan is discharge home, recommend the following:   Other (comment) (PRN)     Functional Status Assessment   Patient has not had a recent decline in their functional status     Equipment Recommendations   None recommended by OT     Recommendations for Other Services         Precautions/Restrictions   Precautions Precautions: Fall Recall of Precautions/Restrictions: Intact Precaution/Restrictions Comments: monitor O2 Restrictions Weight Bearing Restrictions Per Provider Order: No     Mobility Bed Mobility Overal bed mobility: Modified Independent                  Transfers Overall transfer level: Modified independent Equipment used: Rolling walker (2 wheels)                      Balance Overall balance assessment: Needs assistance Sitting-balance support: Feet supported, No upper extremity supported Sitting balance-Leahy Scale: Good     Standing balance support: Bilateral upper extremity supported, No upper extremity supported, During  functional activity Standing balance-Leahy Scale: Fair                             ADL either performed or assessed with clinical judgement   ADL Overall ADL's : Modified independent                                       General ADL Comments: Pt able to manage UB ADLs Mod I sitting EOB, mobilized to sink using RW to stand and brush teeth, comb hair without LOB or issues. SpO2 90% on RA with these tasks. placed back on 2 L O2.     Vision Baseline Vision/History: 1 Wears glasses Ability to See in Adequate Light: 0 Adequate Patient Visual Report: No change from baseline Vision Assessment?: No apparent visual deficits     Perception         Praxis         Pertinent Vitals/Pain Pain Assessment Pain Assessment: No/denies pain     Extremity/Trunk Assessment Upper Extremity Assessment Upper Extremity Assessment: Overall WFL for tasks assessed;Right hand dominant   Lower Extremity Assessment Lower Extremity Assessment: Defer to PT evaluation   Cervical / Trunk Assessment Cervical / Trunk Assessment: Normal   Communication Communication Communication: No apparent difficulties   Cognition Arousal: Alert Behavior During Therapy: WFL for tasks assessed/performed Cognition: No apparent impairments  Following commands: Intact       Cueing  General Comments   Cueing Techniques: Verbal cues      Exercises     Shoulder Instructions      Home Living Family/patient expects to be discharged to:: Private residence Living Arrangements: Spouse/significant other Available Help at Discharge: Family;Available 24 hours/day Type of Home: House Home Access: Stairs to enter [employer] of Steps: 5 Entrance Stairs-Rails: Right;Left Home Layout: One level     Bathroom Shower/Tub: Producer, Television/film/video: Handicapped height Bathroom Accessibility: Yes   Home Equipment: Clinical Biochemist (2 wheels);Wheelchair - manual;BSC/3in1;Cane - single point;Shower seat;Hand held shower head          Prior Functioning/Environment Prior Level of Function : Independent/Modified Independent             Mobility Comments: Pt reports that recently she has been limited by R hip pain however mostly Mod I with RW ADLs Comments: Mod I for ADLs, IADLs    OT Problem List:     OT Treatment/Interventions:        OT Goals(Current goals can be found in the care plan section)   Acute Rehab OT Goals Patient Stated Goal: home soon OT Goal Formulation: All assessment and education complete, DC therapy   OT Frequency:       Co-evaluation              AM-PAC OT 6 Clicks Daily Activity     Outcome Measure Help from another person eating meals?: None Help from another person taking care of personal grooming?: None Help from another person toileting, which includes using toliet, bedpan, or urinal?: A Little Help from another person bathing (including washing, rinsing, drying)?: A Little Help from another person to put on and taking off regular upper body clothing?: None Help from another person to put on and taking off regular lower body clothing?: None 6 Click Score: 22   End of Session Equipment Utilized During Treatment: Rolling walker (2 wheels);Oxygen   Activity Tolerance: Patient tolerated treatment well Patient left: in bed;with call bell/phone within reach;with bed alarm set;Other (comment) (MD at bedside)  OT Visit Diagnosis: Muscle weakness (generalized) (M62.81)                Time: 9187-9159 OT Time Calculation (min): 28 min Charges:  OT General Charges $OT Visit: 1 Visit OT Evaluation $OT Eval Low Complexity: 1 Low OT Treatments $Self Care/Home Management : 8-22 mins  Mliss NOVAK, OTR/L Acute Rehab Services Office: [phone]   Mliss Fish 05/15/2024, 8:53 AM

## 2024-05-15 NOTE — Progress Notes (Addendum)
 PROGRESS NOTE  [name] FMW:[id number] DOB: 1940/08/14   PCP: [name], MD  Patient is from: Home.  DOA: 05/12/2024 LOS: 2  Chief complaints Chief Complaint  Patient presents with   Chest Pain     Brief Narrative / Interim history: 83 year old F with PMH of A-fib, DVT and PE on Coumadin , HTN, OSA on CPAP, hypothyroidism and cellulitis for which she is currently on Bactrim  presenting with reproducible chest pain especially worse with deep inspiration, and admitted with A-fib with RVR, atypical chest pain and diffuse adenopathy.  Slightly tachycardic on arrival.  WBC 15.8 with left shift.  proBNP 1710.  CT angio chest, abdomen and pelvis with mediastinal, hilar, retroperitoneal and inguinal adenopathy, indeterminate 16mm left thyroid  nodule and lower anterior abdominal wall subcutaneous stranding and skin thickening.  Patient was started on doxycycline  and Cardizem  drip.  Cardiology consulted for A-fib with RVR and elevated troponin.  IR consulted for diffuse lymphadenopathy.  CRP and ESR markedly elevated.  Patient also spiking fever.  Blood culture NGTD.  Subjective: Seen and examined earlier this morning.  No major events overnight or this morning.  Slept well.  No further fever.  No complaint this morning.   Assessment and plan: Permanent atrial fibrillation with RVR: RVR resolved.  On warfarin for anticoagulation. TTE without significant finding. -Continue home p.o. Cardizem  and Nebivolol . -Warfarin per pharmacy  Atypical chest pain: Reproducible with palpation suggesting musculoskeletal etiology.  Mild elevation in troponin without delta.  TTE without significant finding.  Markedly elevated inflammatory markers. -Supportive care   Chronic HFpEF: TTE without significant finding.  Received IV Lasix  in ED.  Appears euvolemic on exam -Hold off further diuretics. -Monitor fluid and respiratory status   Bilateral lower extremity cellulitis: Was on Bactrim   outpatient since 11/25 SIRS: Due to cellulitis?  CT suggested possible abdominal wall cellulitis but no cellulitis on exam.  She has markedly elevated inflammatory markers and diffuse LAD.  Leukocytosis improved just with doxycycline .  Blood cultures NGTD. -Continue doxycycline  -Wound care per WOCN.   History of PE/DVT/elevated IgM cardiolipin antibody: Lifelong anticoagulation recommended by oncology previously -Continue warfarin.   Diffuse adenopathy, mediastinal, hilar, retroperitoneal and inguinal: Difficult to palpate on exam. -Plan for inguinal lymph node biopsy by IR on 12/8.  No need to hold Coumadin . -Will have oncology input Addendum Per oncology, nodes are small, likely reactive, could get outpatient PET and excisional biopsy if IR biopsy is nondiagnositc   OSA (obstructive sleep apnea) -CPAP at night   Left thyroid  nodule: CT showed 60 mm left thyroid  nodule.  TSH normal.  Thyroid  ultrasound showed 2.3 cm left mid thyroid  nodule which has been stable over 10 years.  No further evaluation recommended.  Morbid obesity Body mass index is 40.22 kg/m.           DVT prophylaxis:  On warfarin.  Code Status: Full code Family Communication: None at bedside Level of care: Telemetry Status is: Inpatient Remains inpatient appropriate because: SIRS, atrial fibrillation and diffuse lymphadenopathy   Final disposition: Home with home health after lymph node biopsy.   55 minutes with more than 50% spent in reviewing records, counseling patient/family and coordinating care.  Consultants:  Cardiology Interventional radiology Oncology  Procedures: None  Microbiology summarized: COVID-19, influenza and RSV PCR nonreactive Blood cultures NGTD.  Objective: Vitals:   05/14/24 2344 05/15/24 0000 05/15/24 0317 05/15/24 0857  BP: 122/60  131/73 124/74  Pulse: 80 (!) 34 85 83  Resp: 16  18 18   Temp:  99.2 F (37.3 C)  98.8 F (37.1 C) 98.9 F (37.2 C)  TempSrc: Oral   Oral Oral  SpO2: 93% (!) 89% 92% (!) 88%  Weight:   106.3 kg   Height:        Examination:  GENERAL: No apparent distress.  Nontoxic. HEENT: MMM.  Vision and hearing grossly intact.  NECK: Supple.  No apparent JVD.  RESP:  No IWOB.  Fair aeration bilaterally. CVS:  RRR. Heart sounds normal.  ABD/GI/GU: BS+. Abd soft, NTND.  MSK/EXT:  Moves extremities.  Ace wrap over BLE. SKIN: no apparent skin lesion or wound NEURO: AA.  Oriented appropriately.  No apparent focal neuro deficit. PSYCH: Calm. Normal affect.   Sch Meds:  Scheduled Meds:  diltiazem   360 mg Oral QHS   levothyroxine   112 mcg Oral QAC breakfast   nebivolol   5 mg Oral Daily   Warfarin - Pharmacist Dosing Inpatient   Does not apply q1600   Continuous Infusions: PRN Meds:.acetaminophen  **OR** acetaminophen , LORazepam , ondansetron  **OR** ondansetron  (ZOFRAN ) IV, senna-docusate  Antimicrobials: Anti-infectives (From admission, onward)    Start     Dose/Rate Route Frequency Ordered Stop   05/13/24 2200  doxycycline  (VIBRA -TABS) tablet 100 mg        100 mg Oral Every 12 hours 05/13/24 1524 05/15/24 0902        I have personally reviewed the following labs and images: CBC: Recent Labs  Lab 05/08/24 1309 05/12/24 1548 05/13/24 0412 05/14/24 0912 05/15/24 0458  WBC 10.2 11.2* 15.8* 12.0* 11.1*  NEUTROABS 7.3*  --   --  8.7*  --   HGB 13.3 12.7 11.8* 12.2 11.2*  HCT 42.0 39.2 35.5* 37.3 35.1*  MCV 94 90.5 91.0 91.6 92.6  PLT 223 271 263 267 245   BMP &GFR Recent Labs  Lab 05/08/24 1309 05/12/24 1548 05/13/24 0412 05/14/24 0912 05/15/24 0458  NA 138 134* 136 136 137  K 4.6 4.2 3.9 3.8 3.7  CL 100 97* 97* 97* 100  CO2 21 26 30 31 29   GLUCOSE 121* 183* 141* 97 99  BUN 13 16 13 19 21   CREATININE 0.75 0.90 0.94 0.93 0.89  CALCIUM  9.2 9.6 8.6* 8.6* 8.1*  MG  --   --   --  2.0 2.1  PHOS  --   --   --   --  3.7   Estimated Creatinine Clearance: 54.9 mL/min (by C-G formula based on SCr of 0.89  mg/dL). Liver & Pancreas: Recent Labs  Lab 05/14/24 0912 05/15/24 0458  AST 21  --   ALT 20  --   ALKPHOS 80  --   BILITOT 1.1  --   PROT 7.4  --   ALBUMIN 2.3* 2.0*   No results for input(s): LIPASE, AMYLASE in the last 168 hours. No results for input(s): AMMONIA in the last 168 hours. Diabetic: No results for input(s): HGBA1C in the last 72 hours. No results for input(s): GLUCAP in the last 168 hours. Cardiac Enzymes: No results for input(s): CKTOTAL, CKMB, CKMBINDEX, TROPONINI in the last 168 hours. Recent Labs    05/12/24 1806  PROBNP 1,710.0*   Coagulation Profile: Recent Labs  Lab 05/12/24 1758 05/13/24 0412 05/14/24 0408 05/15/24 0458  INR 3.6* 3.2* 2.3* 1.9*   Thyroid  Function Tests: Recent Labs    05/13/24 1944  TSH 3.485   Lipid Profile: No results for input(s): CHOL, HDL, LDLCALC, TRIG, CHOLHDL, LDLDIRECT in the last 72 hours. Anemia Panel: No results for input(s): VITAMINB12,  FOLATE, FERRITIN, TIBC, IRON, RETICCTPCT in the last 72 hours. Urine analysis:    Component Value Date/Time   COLORURINE COLORLESS (A) 09/20/2022 1812   APPEARANCEUR CLEAR 09/20/2022 1812   LABSPEC 1.005 09/20/2022 1812   PHURINE 5.0 09/20/2022 1812   GLUCOSEU NEGATIVE 09/20/2022 1812   HGBUR NEGATIVE 09/20/2022 1812   BILIRUBINUR NEGATIVE 09/20/2022 1812   KETONESUR NEGATIVE 09/20/2022 1812   PROTEINUR NEGATIVE 09/20/2022 1812   UROBILINOGEN 0.2 06/02/2007 1024   NITRITE NEGATIVE 09/20/2022 1812   LEUKOCYTESUR NEGATIVE 09/20/2022 1812   Sepsis Labs: Invalid input(s): PROCALCITONIN, LACTICIDVEN  Microbiology: Recent Results (from the past 240 hours)  Resp panel by RT-PCR (RSV, Flu A&B, Covid) Anterior Nasal Swab     Status: None   Collection Time: 05/12/24  6:04 PM   Specimen: Anterior Nasal Swab  Result Value Ref Range Status   SARS Coronavirus 2 by RT PCR NEGATIVE NEGATIVE Final    Comment: (NOTE) SARS-CoV-2 target  nucleic acids are NOT DETECTED.  The SARS-CoV-2 RNA is generally detectable in upper respiratory specimens during the acute phase of infection. The lowest concentration of SARS-CoV-2 viral copies this assay can detect is 138 copies/mL. A negative result does not preclude SARS-Cov-2 infection and should not be used as the sole basis for treatment or other patient management decisions. A negative result may occur with  improper specimen collection/handling, submission of specimen other than nasopharyngeal swab, presence of viral mutation(s) within the areas targeted by this assay, and inadequate number of viral copies(<138 copies/mL). A negative result must be combined with clinical observations, patient history, and epidemiological information. The expected result is Negative.  Fact Sheet for Patients:  bloggercourse.com  Fact Sheet for Healthcare Providers:  seriousbroker.it  This test is no t yet approved or cleared by the [place]  FDA and  has been authorized for detection and/or diagnosis of SARS-CoV-2 by FDA under an Emergency Use Authorization (EUA). This EUA will remain  in effect (meaning this test can be used) for the duration of the COVID-19 declaration under Section 564(b)(1) of the Act, 21 U.S.C.section 360bbb-3(b)(1), unless the authorization is terminated  or revoked sooner.       Influenza A by PCR NEGATIVE NEGATIVE Final   Influenza B by PCR NEGATIVE NEGATIVE Final    Comment: (NOTE) The Xpert Xpress SARS-CoV-2/FLU/RSV plus assay is intended as an aid in the diagnosis of influenza from Nasopharyngeal swab specimens and should not be used as a sole basis for treatment. Nasal washings and aspirates are unacceptable for Xpert Xpress SARS-CoV-2/FLU/RSV testing.  Fact Sheet for Patients: bloggercourse.com  Fact Sheet for Healthcare  Providers: seriousbroker.it  This test is not yet approved or cleared by the [place]  FDA and has been authorized for detection and/or diagnosis of SARS-CoV-2 by FDA under an Emergency Use Authorization (EUA). This EUA will remain in effect (meaning this test can be used) for the duration of the COVID-19 declaration under Section 564(b)(1) of the Act, 21 U.S.C. section 360bbb-3(b)(1), unless the authorization is terminated or revoked.     Resp Syncytial Virus by PCR NEGATIVE NEGATIVE Final    Comment: (NOTE) Fact Sheet for Patients: bloggercourse.com  Fact Sheet for Healthcare Providers: seriousbroker.it  This test is not yet approved or cleared by the [place]  FDA and has been authorized for detection and/or diagnosis of SARS-CoV-2 by FDA under an Emergency Use Authorization (EUA). This EUA will remain in effect (meaning this test can be used) for the duration of the COVID-19 declaration under Section 564(b)(1)  of the Act, 21 U.S.C. section 360bbb-3(b)(1), unless the authorization is terminated or revoked.  Performed at [facility], [address] [place], [place] [address]   Culture, blood (Routine X 2) w Reflex to ID Panel     Status: None (Preliminary result)   Collection Time: 05/14/24  9:12 AM   Specimen: BLOOD RIGHT ARM  Result Value Ref Range Status   Specimen Description BLOOD RIGHT ARM  Final   Special Requests   Final    BOTTLES DRAWN AEROBIC AND ANAEROBIC Blood Culture adequate volume   Culture   Final    NO GROWTH < 24 HOURS Performed at [facility], [address], [place], [place] [address]    Report Status PENDING  Incomplete  Culture, blood (Routine X 2) w Reflex to ID Panel     Status: None (Preliminary result)   Collection Time: 05/14/24  9:19 AM   Specimen: BLOOD LEFT ARM  Result Value Ref Range Status   Specimen Description BLOOD LEFT ARM  Final    Special Requests   Final    BOTTLES DRAWN AEROBIC AND ANAEROBIC Blood Culture adequate volume   Culture   Final    NO GROWTH < 24 HOURS Performed at [facility], [address], [place], [place] [address]    Report Status PENDING  Incomplete    Radiology Studies: No results found.     Nicasio Barlowe T. Lundynn Cohoon [facility]  If 7PM-7AM, please contact night-coverage [email or web address] 05/15/2024, 10:59 AM

## 2024-05-15 NOTE — Plan of Care (Signed)

## 2024-05-15 NOTE — Plan of Care (Signed)
  Problem: Education: Goal: Knowledge of General Education information will improve Description: Including pain rating scale, medication(s)/side effects and non-pharmacologic comfort measures Outcome: Progressing   Problem: Clinical Measurements: Goal: Ability to maintain clinical measurements within normal limits will improve Outcome: Progressing Goal: Respiratory complications will improve Outcome: Progressing Goal: Cardiovascular complication will be avoided Outcome: Progressing   Problem: Nutrition: Goal: Adequate nutrition will be maintained Outcome: Progressing   Problem: Coping: Goal: Level of anxiety will decrease Outcome: Progressing   Problem: Elimination: Goal: Will not experience complications related to bowel motility Outcome: Progressing Goal: Will not experience complications related to urinary retention Outcome: Progressing   Problem: Pain Managment: Goal: General experience of comfort will improve and/or be controlled Outcome: Progressing   Problem: Safety: Goal: Ability to remain free from injury will improve Outcome: Progressing   Problem: Skin Integrity: Goal: Risk for impaired skin integrity will decrease Outcome: Progressing

## 2024-05-16 ENCOUNTER — Other Ambulatory Visit (HOSPITAL_COMMUNITY): Payer: Self-pay

## 2024-05-16 ENCOUNTER — Inpatient Hospital Stay (HOSPITAL_COMMUNITY)

## 2024-05-16 ENCOUNTER — Encounter (HOSPITAL_COMMUNITY): Payer: Self-pay

## 2024-05-16 DIAGNOSIS — L03119 Cellulitis of unspecified part of limb: Secondary | ICD-10-CM | POA: Diagnosis not present

## 2024-05-16 DIAGNOSIS — I2699 Other pulmonary embolism without acute cor pulmonale: Secondary | ICD-10-CM

## 2024-05-16 DIAGNOSIS — I5033 Acute on chronic diastolic (congestive) heart failure: Secondary | ICD-10-CM | POA: Diagnosis not present

## 2024-05-16 DIAGNOSIS — I4891 Unspecified atrial fibrillation: Secondary | ICD-10-CM | POA: Diagnosis not present

## 2024-05-16 DIAGNOSIS — G4733 Obstructive sleep apnea (adult) (pediatric): Secondary | ICD-10-CM

## 2024-05-16 DIAGNOSIS — R0789 Other chest pain: Secondary | ICD-10-CM | POA: Diagnosis not present

## 2024-05-16 DIAGNOSIS — R599 Enlarged lymph nodes, unspecified: Secondary | ICD-10-CM

## 2024-05-16 DIAGNOSIS — Z7901 Long term (current) use of anticoagulants: Secondary | ICD-10-CM

## 2024-05-16 DIAGNOSIS — I4821 Permanent atrial fibrillation: Secondary | ICD-10-CM

## 2024-05-16 DIAGNOSIS — I825Y2 Chronic embolism and thrombosis of unspecified deep veins of left proximal lower extremity: Secondary | ICD-10-CM

## 2024-05-16 LAB — RENAL FUNCTION PANEL
Albumin: 2.1 g/dL — ABNORMAL LOW (ref 3.5–5.0)
Anion gap: 13 (ref 5–15)
BUN: 18 mg/dL (ref 8–23)
CO2: 28 mmol/L (ref 22–32)
Calcium: 8.4 mg/dL — ABNORMAL LOW (ref 8.9–10.3)
Chloride: 99 mmol/L (ref 98–111)
Creatinine, Ser: 0.8 mg/dL (ref 0.44–1.00)
GFR, Estimated: >60 mL/min (ref >60)
Glucose, Bld: 113 mg/dL — ABNORMAL HIGH (ref 70–99)
Phosphorus: 3.5 mg/dL (ref 2.5–4.6)
Potassium: 3.6 mmol/L (ref 3.5–5.1)
Sodium: 140 mmol/L (ref 135–145)

## 2024-05-16 LAB — CBC
HCT: 37.1 % (ref 36.0–46.0)
Hemoglobin: 12 g/dL (ref 12.0–15.0)
MCH: 29.6 pg (ref 26.0–34.0)
MCHC: 32.3 g/dL (ref 30.0–36.0)
MCV: 91.6 fL (ref 80.0–100.0)
Platelets: 258 K/uL (ref 150–400)
RBC: 4.05 MIL/uL (ref 3.87–5.11)
RDW: 14.2 % (ref 11.5–15.5)
WBC: 8.6 K/uL (ref 4.0–10.5)
nRBC: 0 % (ref 0.0–0.2)

## 2024-05-16 LAB — PROTIME-INR
INR: 1.8 — ABNORMAL HIGH (ref 0.8–1.2)
Prothrombin Time: 21.6 s — ABNORMAL HIGH (ref 11.4–15.2)

## 2024-05-16 LAB — MAGNESIUM: Magnesium: 2 mg/dL (ref 1.7–2.4)

## 2024-05-16 MED ORDER — DOXYCYCLINE HYCLATE 100 MG PO TABS
100.0000 mg | ORAL_TABLET | Freq: Two times a day (BID) | ORAL | 0 refills | Status: AC
  Filled 2024-05-16: qty 10, 5d supply, fill #0

## 2024-05-16 MED ORDER — FENTANYL CITRATE (PF) 100 MCG/2ML IJ SOLN
INTRAMUSCULAR | Status: AC
  Filled 2024-05-16: qty 2

## 2024-05-16 MED ORDER — IRBESARTAN 75 MG PO TABS
75.0000 mg | ORAL_TABLET | Freq: Every day | ORAL | 0 refills | Status: DC
  Filled 2024-05-16: qty 60, 60d supply, fill #0

## 2024-05-16 MED ORDER — WARFARIN SODIUM 5 MG PO TABS: 5.0000 mg | ORAL_TABLET | Freq: Once | ORAL | Status: DC

## 2024-05-16 MED ORDER — MIDAZOLAM HCL 2 MG/2ML IJ SOLN
INTRAMUSCULAR | Status: AC
  Filled 2024-05-16: qty 2

## 2024-05-16 MED ORDER — LIDOCAINE HCL (PF) 1 % IJ SOLN
4.0000 mL | Freq: Once | INTRAMUSCULAR | Status: AC
  Administered 2024-05-16: 4 mL via INTRADERMAL

## 2024-05-16 MED ORDER — MIDAZOLAM HCL (PF) 2 MG/2ML IJ SOLN
INTRAMUSCULAR | Status: AC | PRN
  Administered 2024-05-16: 1 mg via INTRAVENOUS

## 2024-05-16 MED ORDER — FUROSEMIDE 40 MG PO TABS
40.0000 mg | ORAL_TABLET | Freq: Every day | ORAL | Status: DC
  Administered 2024-05-16: 40 mg via ORAL
  Filled 2024-05-16: qty 1

## 2024-05-16 MED ORDER — SENNOSIDES-DOCUSATE SODIUM 8.6-50 MG PO TABS: 1.0000 | ORAL_TABLET | Freq: Two times a day (BID) | ORAL | Status: AC | PRN

## 2024-05-16 MED ORDER — FENTANYL CITRATE (PF) 100 MCG/2ML IJ SOLN
INTRAMUSCULAR | Status: AC | PRN
  Administered 2024-05-16 (×2): 25 ug via INTRAVENOUS

## 2024-05-16 NOTE — Progress Notes (Signed)
 Physical Therapy Treatment Patient Details Name: [name] MRN: [id number] DOB: 12-Jun-1940 Today's Date: 05/16/2024   History of Present Illness 83 year old admitted with A-fib with RVR, atypical chest pain and diffuse adenopathy. PMH of A-fib, DVT and PE on Coumadin , HTN, OSA on CPAP, hypothyroidism and cellulitis for which she is currently on Bactrim .    PT Comments  Pt admitted with above diagnosis. Pt was able to ambulate with RW with supervision and cues for safety. Pt desaturates to 88% on RA with activity and did incr saturation to 91% with 2LO2.   Pt without LOB with RW and has family support therefore doesn't need f/u therapy.  Notified MD and nurse regarding VS/O2 saturation as pt is going home today.  MD to determine if pt needs O2. Pt currently with functional limitations due to the deficits listed below (see PT Problem List). Pt will benefit from acute skilled PT to increase their independence and safety with mobility to allow discharge.      SATURATION QUALIFICATIONS: (This note is used to comply with regulatory documentation for home oxygen )   Patient Saturations on Room Air at Rest = 91%   Patient Saturations on Room Air while Ambulating = 88%   Patient Saturations on 2 Liters of oxygen  while Ambulating = 91%   Please briefly explain why patient needs home oxygen :Pt desaturating to 88% on RA with ambulation.  O2 sats > 90% with 2LO2 with activity.  If plan is discharge home, recommend the following: Help with stairs or ramp for entrance;Assist for transportation   Can travel by private vehicle        Equipment Recommendations  None recommended by PT    Recommendations for Other Services       Precautions / Restrictions Precautions Precautions: Fall Recall of Precautions/Restrictions: Intact Precaution/Restrictions Comments: monitor O2 Restrictions Weight Bearing Restrictions Per Provider Order: No     Mobility  Bed Mobility Overal bed mobility:  Modified Independent                  Transfers Overall transfer level: Modified independent Equipment used: Rolling walker (2 wheels)               General transfer comment: Good hand placement.    Ambulation/Gait Ambulation/Gait assistance: Supervision Gait Distance (Feet): 380 Feet Assistive device: Rolling walker (2 wheels) Gait Pattern/deviations: WFL(Within Functional Limits) Gait velocity: decreased Gait velocity interpretation: <1.31 ft/sec, indicative of household ambulator   General Gait Details: Several standing rest break to check SpO2. Pt O2 saturation 88% on RA during ambulation.  Pt 91% on 2LO2 with activity. Notified MD. Pt with steady gait overall with RW.  Pt was 91% on RA at rest therefore left O2 off of pt and nurse made aware. Nurse and CCMD also made aware that pt removed telemetry.   Stairs    Pt declines practicing stairs with pt stating that she doesn't do this frequently and she has a good technique.           Wheelchair Mobility     Tilt Bed    Modified Rankin (Stroke Patients Only)       Balance Overall balance assessment: Needs assistance Sitting-balance support: Feet supported, No upper extremity supported Sitting balance-Leahy Scale: Good     Standing balance support: Bilateral upper extremity supported, No upper extremity supported, During functional activity Standing balance-Leahy Scale: Poor Standing balance comment: needed Ue support  Communication Communication Communication: No apparent difficulties  Cognition Arousal: Alert Behavior During Therapy: WFL for tasks assessed/performed   PT - Cognitive impairments: No apparent impairments                         Following commands: Intact      Cueing Cueing Techniques: Verbal cues  Exercises      General Comments General comments (skin integrity, edema, etc.): Encouraged and reviewed incentive spirometer.       Pertinent Vitals/Pain Pain Assessment Pain Assessment: No/denies pain    Home Living                          Prior Function            PT Goals (current goals can now be found in the care plan section) Acute Rehab PT Goals Patient Stated Goal: to go home Progress towards PT goals: Progressing toward goals    Frequency    Min 2X/week      PT Plan      Co-evaluation              AM-PAC PT 6 Clicks Mobility   Outcome Measure  Help needed turning from your back to your side while in a flat bed without using bedrails?: None Help needed moving from lying on your back to sitting on the side of a flat bed without using bedrails?: None Help needed moving to and from a bed to a chair (including a wheelchair)?: None Help needed standing up from a chair using your arms (e.g., wheelchair or bedside chair)?: None Help needed to walk in hospital room?: A Little Help needed climbing 3-5 steps with a railing? : A Little 6 Click Score: 22    End of Session Equipment Utilized During Treatment: Gait belt;Oxygen  Activity Tolerance: Patient tolerated treatment well;Patient limited by fatigue Patient left: in bed;with call bell/phone within reach;with family/visitor present (sitting EOB) Nurse Communication: Mobility status PT Visit Diagnosis: Other abnormalities of gait and mobility (R26.89)     Time: 8961-8886 PT Time Calculation (min) (ACUTE ONLY): 35 min  Charges:    $Gait Training: 23-37 mins PT General Charges $$ ACUTE PT VISIT: 1 Visit                     Jamario Colina M,PT Acute Rehab Services [phone]    Stephane JULIANNA Bevel 05/16/2024, 1:00 PM

## 2024-05-16 NOTE — Plan of Care (Signed)

## 2024-05-16 NOTE — Procedures (Signed)
 Interventional Radiology Procedure Note  Procedure: US  guided core biopsy of LEFT INGUINAL LN  Complications: None  Estimated Blood Loss: None  Recommendations: - Bedrest x 1 hr - Path sent   Signed,  Wilkie LOIS Lent, MD

## 2024-05-16 NOTE — Progress Notes (Signed)
 SATURATION QUALIFICATIONS: (This note is used to comply with regulatory documentation for home oxygen )  Patient Saturations on Room Air at Rest = 91%  Patient Saturations on Room Air while Ambulating = 88%  Patient Saturations on 2 Liters of oxygen  while Ambulating = 91%  Please briefly explain why patient needs home oxygen :Pt desaturating to 88% on RA with ambulation.  O2 sats > 90% with 2LO2 with activity.  [name] M,PT Acute Rehab Services [phone]

## 2024-05-16 NOTE — Progress Notes (Signed)
 PHARMACY - ANTICOAGULATION CONSULT NOTE  Pharmacy Consult for Warfarin Indication: atrial fibrillation and hx PE/DVT  Allergies  Allergen Reactions   Atenolol Other (See Comments)    Depression    Citalopram Hydrobromide Nausea Only and Other (See Comments)    Nausea/Fatigue   Clindamycin /Lincomycin Other (See Comments)    Via IV, has caused a metallic taste in the mouth    Codeine Other (See Comments)    Caused Hyperactivity and Dysphoria   Coenzyme Q10 Itching   Erythromycin Itching, Anxiety and Other (See Comments)    Also with jitters   Motrin [Ibuprofen] Other (See Comments)    Is to not take this   Statins Other (See Comments)    Leg muscle weakness w/ rosuvastatin and simvastatin    Latex Rash   Losartan Potassium Other (See Comments)    Muscle aches     Patient Measurements: Height: 5' 4 (162.6 cm) Weight: 106.6 kg (235 lb) IBW/kg (Calculated) : 54.7 HEPARIN DW (KG): 80.4  Vital Signs: Temp: 97.9 F (36.6 C) (12/08 0756) Temp Source: Oral (12/08 0756) BP: 121/82 (12/08 0955) Pulse Rate: 89 (12/08 0955)  Labs: Recent Labs    05/14/24 0408 05/14/24 0912 05/14/24 0912 05/15/24 0458 05/16/24 0642  HGB  --  12.2   < > 11.2* 12.0  HCT  --  37.3  --  35.1* 37.1  PLT  --  267  --  245 258  LABPROT 26.7*  --   --  22.5* 21.6*  INR 2.3*  --   --  1.9* 1.8*  CREATININE  --  0.93  --  0.89 0.80   < > = values in this interval not displayed.    Estimated Creatinine Clearance: 61.3 mL/min (by C-G formula based on SCr of 0.8 mg/dL).  Assessment: 83 yr old female on warfarin prior to admit for hx PE and DVT. Pharmacy consulted to continue warfarin for atrial fibrillation and hx PE/DVT.   PTA warfarin regimen: 5 mg daily (at last OP OV 11/26 INR was slightly above goal at 3.1 but no changes in warfarin regimen given so close to goal)  INR subtherapeutic at 1.8 today, likely due to holding and reduced doses.  Goal of Therapy:  INR 2-3 Monitor platelets by  anticoagulation protocol: Yes   Plan:  Warfarin 5 mg x1 again tonight Daily INR  Ozell Jamaica, PharmD, BCPS, [facility] Clinical Pharmacist [phone] Please check AMION for all [facility] Pharmacy numbers 05/16/2024

## 2024-05-16 NOTE — Plan of Care (Signed)
  Problem: Education: Goal: Knowledge of General Education information will improve Description: Including pain rating scale, medication(s)/side effects and non-pharmacologic comfort measures Outcome: Progressing   Problem: Clinical Measurements: Goal: Ability to maintain clinical measurements within normal limits will improve Outcome: Progressing Goal: Respiratory complications will improve Outcome: Progressing Goal: Cardiovascular complication will be avoided Outcome: Progressing   Problem: Activity: Goal: Risk for activity intolerance will decrease Outcome: Progressing   Problem: Nutrition: Goal: Adequate nutrition will be maintained Outcome: Progressing   Problem: Coping: Goal: Level of anxiety will decrease Outcome: Progressing   Problem: Elimination: Goal: Will not experience complications related to urinary retention Outcome: Progressing   Problem: Pain Managment: Goal: General experience of comfort will improve and/or be controlled Outcome: Progressing   Problem: Education: Goal: Knowledge of disease or condition will improve Outcome: Progressing   Problem: Activity: Goal: Ability to tolerate increased activity will improve Outcome: Progressing

## 2024-05-16 NOTE — Discharge Summary (Signed)
 Physician Discharge Summary  KATELEEN Mclean FMW:[id number] DOB: 1940-09-29 DOA: 05/12/2024  PCP: Linda Toribio MATSU, MD  Admit date: 05/12/2024 Discharge date: 05/16/24  Admitted From: Home Disposition: Home Recommendations for Outpatient Follow-up:  Outpatient follow-up with PCP Hematology/oncology to arrange outpatient follow-up. Check blood pressure, CMP and CBC at follow-up Please follow up on the following pending results: Left inguinal lymph node biopsy result  Home Health: Patient refused home health RN Equipment/Devices: Home oxygen , 2 L by [place] with ambulation  Discharge Condition: Stable CODE STATUS: Full code   Follow-up Information     [facility] (DME) Follow up.   Specialty: DME Services Why: Oxygen  to be delivered to the room. SABRA Pass information: [address] [place]  [address] [phone]        Linda Toribio MATSU, MD. Schedule an appointment as soon as possible for a visit in 1 week(s).   Specialty: Internal Medicine Contact information: [address] [place] [address] [phone]                 Hospital course 83 year old F with PMH of A-fib, DVT and PE on Coumadin , HTN, OSA on CPAP, hypothyroidism and cellulitis for which she is currently on Bactrim  presenting with reproducible chest pain especially worse with deep inspiration, and admitted with A-fib with RVR, atypical chest pain and diffuse adenopathy.  Slightly tachycardic on arrival.  WBC 15.8 with left shift.  proBNP 1710.  CT angio chest, abdomen and pelvis with mediastinal, hilar, retroperitoneal and inguinal adenopathy, indeterminate 16mm left thyroid  nodule and lower anterior abdominal wall subcutaneous stranding and skin thickening.  Patient was started on doxycycline  and Cardizem  drip.  Cardiology consulted for A-fib with RVR and elevated troponin.  IR consulted for diffuse lymphadenopathy.   CRP and ESR markedly elevated.  Patient  also spiking isolated fever to 102.3 on 12/5.  No further fever afterward.  Blood culture NGTD but patient was on different antibiotics before blood culture.  Leukocytosis resolved with p.o. doxycycline .  She is discharged on p.o. doxycycline  for 5 more days for lower extremity cellulitis.  In regards to diffuse lymphadenopathy, she underwent left inguinal lymph node biopsy by IR on 12/8.  Discussed case with oncology, Dr. Cloretta.  His team will follow-up on biopsy result and arrange outpatient follow-up for possible PET scan and excisional biopsy if lymph node biopsy is unrevealing.  I have also ordered ambulatory referral to ensure follow-up.  In regards to A-fib, she was transition to p.o. Cardizem  and home Nebivolol  the next day and remained rate controlled.  Cardiology signed off.  She will continue home Cardizem  CD and Nebivolol .  See individual problem list below for more.   Problems addressed during this hospitalization Permanent atrial fibrillation with RVR: RVR resolved.  On warfarin for anticoagulation. TTE without significant finding. -Continue home p.o. Cardizem , Nebivolol  and warfarin.   Atypical chest pain: Reproducible with palpation suggesting musculoskeletal etiology.  Mild elevation in troponin without delta.  TTE without significant finding.  Markedly elevated inflammatory markers. -Supportive care   Chronic HFpEF: TTE without significant finding.  Received IV Lasix  in ED.  Appears euvolemic on exam - Discharged on home Lasix  40 mg daily.   Bilateral lower extremity cellulitis: Was on Bactrim  outpatient since 11/25 SIRS: Due to cellulitis?  CT suggested possible abdominal wall cellulitis but no cellulitis on exam.  She has markedly elevated inflammatory markers and diffuse LAD.  Blood cultures NGTD.  Leukocytosis resolved. -Discharged on p.o. doxycycline  for 5 more  days. -Patient declined home health RN for wound care.    History of PE/DVT/elevated IgM cardiolipin  antibody: Lifelong anticoagulation recommended by oncology previously -Continue warfarin.   Diffuse adenopathy, mediastinal, hilar, retroperitoneal and inguinal: Difficult to palpate on exam. - S/p inguinal lymph node biopsy by IR on 12/8.  Oncology to follow-up on biopsy result and arrange outpatient follow-up  Acute respiratory failure with hypoxia: Desaturated to 88% with ambulation on room air. OSA (obstructive sleep apnea) -Ordered home oxygen  2 L by nasal cannula to be used with activity -CPAP at night  Essential hypertension: Normotensive just on Cardizem  CD and Nebivolol . -Continue home Cardizem  CD and Nebivolol  -Decreased home Avapro  from 300 mg daily to 75 mg   Left thyroid  nodule: CT showed 60 mm left thyroid  nodule.  TSH normal.  Thyroid  ultrasound showed 2.3 cm left mid thyroid  nodule which has been stable over 10 years.  No further evaluation recommended.   Morbid obesity Body mass index is 40.34 kg/m.  -Encourage lifestyle change to lose weight.         Consultations: Interventional radiology Oncologist-hematology over the phone  Time spent 35  minutes  Vital signs Vitals:   05/16/24 0945 05/16/24 0950 05/16/24 0950 05/16/24 0955  BP: 126/74 123/80 123/80 121/82  Pulse: 75 79 79 89  Temp:      Resp: (!) 26 (!) 23 (!) 24 (!) 24  Height:      Weight:      SpO2: 92% 91% 93% 93%  TempSrc:      BMI (Calculated):         Discharge exam  GENERAL: No apparent distress.  Nontoxic. HEENT: MMM.  Vision and hearing grossly intact.  NECK: Supple.  No apparent JVD.  RESP:  No IWOB.  Fair aeration bilaterally. CVS: Irregular rhythm.  Normal rate.  Heart sounds normal.  ABD/GI/GU: BS+. Abd soft, NTND.  MSK/EXT:  Moves extremities.  Ace wrap over BLE. SKIN: Ace wrap over BLE. NEURO: Awake and alert. Oriented appropriately.  No apparent focal neuro deficit. PSYCH: Calm. Normal affect.   Discharge Instructions Discharge Instructions     Ambulatory referral to  Hematology / Oncology   Complete by: As directed    For diffuse lymphadenopathy. S/p left inguinal lymph node biopsy.   Discharge instructions   Complete by: As directed    It has been a pleasure taking care of you!  You were hospitalized due to chest pain and atrial fibrillation for which you have been treated.  Your CT scan also showed diffuse enlarged lymph nodes.  The cause for enlarged lymph node is not clear but we have done biopsy.  Someone from hematology/oncology office will contact you with the pathology result of lymph node biopsy and discuss further plan for evaluation and management.  Please follow-up with your primary care doctor in 1 to 2 weeks or sooner if needed.  Meanwhile, continue taking doxycycline  for possible infection/cellulitis.   Take care,   Discharge wound care:   Complete by: As directed    Wound care  Every 12 hours    BLE. Cleanse wound with Vashe solution #[id number], pat dry with gauze and apply 1 layer of Xeroform yellow guaze #[id number] over wound, cover with rolled Kerlix gauze, secure in place with tape and apply light stretch with ACE bandage.   Increase activity slowly   Complete by: As directed       Allergies as of 05/16/2024       Reactions   Atenolol Other (  See Comments)   Depression   Citalopram Hydrobromide Nausea Only, Other (See Comments)   Nausea/Fatigue   Clindamycin /lincomycin Other (See Comments)   Via IV, has caused a metallic taste in the mouth    Codeine Other (See Comments)   Caused Hyperactivity and Dysphoria   Coenzyme Q10 Itching   Erythromycin Itching, Anxiety, Other (See Comments)   Also with jitters   Motrin [ibuprofen] Other (See Comments)   Is to not take this   Statins Other (See Comments)   Leg muscle weakness w/ rosuvastatin and simvastatin    Latex Rash   Losartan Potassium Other (See Comments)   Muscle aches        Medication List     STOP taking these medications    cefTRIAXone  1 g injection Commonly known  as: ROCEPHIN    cephALEXin  500 MG capsule Commonly known as: KEFLEX    nystatin  cream Commonly known as: MYCOSTATIN    sulfamethoxazole -trimethoprim  800-160 MG tablet Commonly known as: BACTRIM  DS       TAKE these medications    acetaminophen  325 MG tablet Commonly known as: TYLENOL  Take 325-650 mg by mouth 2 (two) times daily as needed for mild pain or headache.   diltiazem  360 MG 24 hr capsule Commonly known as: TIAZAC  Take 360 mg by mouth at bedtime.   doxycycline  100 MG tablet Commonly known as: VIBRA -TABS Take 1 tablet (100 mg total) by mouth every 12 (twelve) hours for 5 days.   fluorouracil 5 % cream Commonly known as: EFUDEX Apply 1 Application topically at bedtime. On hand   furosemide  40 MG tablet Commonly known as: LASIX  Take 40 mg by mouth daily.   irbesartan  75 MG tablet Commonly known as: AVAPRO  Take 1 tablet (75 mg total) by mouth daily. What changed:  medication strength how much to take   levothyroxine  112 MCG tablet Commonly known as: SYNTHROID  Take 112 mcg by mouth every morning.   LORazepam  1 MG tablet Commonly known as: ATIVAN  Take 1.5 mg by mouth See admin instructions. Take 1.5 mg by mouth at bedtime and an additional 1-1.5mg  once a day as needed for anxiety   nebivolol  5 MG tablet Commonly known as: BYSTOLIC  Take 1 tablet (5 mg total) by mouth daily. What changed: when to take this   REFRESH LIQUIGEL OP Place 1 drop into both eyes at bedtime.   senna-docusate 8.6-50 MG tablet Commonly known as: Senokot-S Take 1-2 tablets by mouth 2 (two) times daily between meals as needed for mild constipation or moderate constipation.   SIMPLY SALINE NA Place 1 spray into both nostrils as needed (for dryness).   triamcinolone cream 0.1 % Commonly known as: KENALOG Apply 1 Application topically See admin instructions. Apply to both legs 2 times a day   warfarin 5 MG tablet Commonly known as: COUMADIN  Take 5 mg by mouth at bedtime.                Durable Medical Equipment  (From admission, onward)           Start     Ordered   05/16/24 1212  For home use only DME oxygen   Once       Comments: POC setting of 2.  Question Answer Comment  Length of Need 6 Months   Mode or (Route) Nasal cannula   Liters per Minute 2   Frequency Continuous (stationary and portable oxygen  unit needed)   Oxygen  delivery system: Gas      05/16/24 1211  Discharge Care Instructions  (From admission, onward)           Start     Ordered   05/16/24 0000  Discharge wound care:       Comments: Wound care  Every 12 hours    BLE. Cleanse wound with Vashe solution #[id number], pat dry with gauze and apply 1 layer of Xeroform yellow guaze #294 over wound, cover with rolled Kerlix gauze, secure in place with tape and apply light stretch with ACE bandage.   05/16/24 1009             Procedures/Studies:   US  CORE BIOPSY (LYMPH NODES) Result Date: 05/16/2024 INDICATION: 83 year old female with nonspecific mild lymphadenopathy. CLL or other indolent process suspected. She presents for biopsy. EXAM: ULTRASOUND BIOPSY OF THE LYMPH NODES MEDICATIONS: None. ANESTHESIA/SEDATION: Moderate (conscious) sedation was employed during this procedure. A total of Versed  1 mg and Fentanyl  50 mcg was administered intravenously by the Radiology nurse. Moderate Sedation Time: 11 minutes. The patient's level of consciousness and vital signs were monitored continuously by radiology nursing throughout the procedure under my direct supervision. FLUOROSCOPY TIME:  None. COMPLICATIONS: None immediate. PROCEDURE: Informed written consent was obtained from the patient after a thorough discussion of the procedural risks, benefits and alternatives. All questions were addressed. Maximal Sterile Barrier Technique was utilized including caps, mask, sterile gowns, sterile gloves, sterile drape, hand hygiene and skin antiseptic. A timeout was performed prior  to the initiation of the procedure. The left inguinal region was interrogated with ultrasound. One of the enlarged lymph nodes was identified. A suitable skin entry site was selected and marked. The region was sterilely prepped and draped in the standard fashion using chlorhexidine  skin prep. Local anesthesia was attained by infiltration with 1% lidocaine . A small dermatotomy was made. Under real-time ultrasound guidance, multiple 18 gauge core biopsies were obtained using the [employer] mission automated biopsy device. Biopsy specimens were placed in saline for further evaluation. IMPRESSION: Ultrasound-guided core biopsy of left inguinal lymphadenopathy. Electronically Signed   By: Wilkie Lent M.D.   On: 05/16/2024 10:14   US  THYROID  Result Date: 05/14/2024 CLINICAL DATA:  Incidental on CT. 8524041 Nodule of left lobe of thyroid  gland [id number] Reported history of RIGHT hemithyroidectomy, and prior biopsy of a dominant then 2.7 cm LEFT thyroid  nodule (06/12/2010). EXAM: THYROID  ULTRASOUND TECHNIQUE: Ultrasound examination of the thyroid  gland and adjacent soft tissues was performed. COMPARISON:  CT CAP, 05/12/2024. US  Thyroid , 01/31/2016 and 05/29/2010. FINDINGS: Parenchymal Echotexture: Mildly heterogenous Isthmus: 0.3 cm Right lobe: Surgically absent Left lobe: 3.7 x 2.4 x 2.1 cm _________________________________________________________ Estimated total number of nodules >/= 1 cm: 1 Number of spongiform nodules >/=  2 cm not described below (TR1): 0 Number of mixed cystic and solid nodules >/= 1.5 cm not described below (TR2): 0 _________________________________________________________ Nodule # 1: Location: LEFT; Mid Maximum size: 2.3 cm; Other 2 dimensions: 1.6 x 1.7 cm Composition: solid/almost completely solid (2) Echogenicity: hypoechoic (2) Shape: not taller-than-wide (0) Margins: ill-defined (0) Echogenic foci: punctate echogenic foci (3) This nodule appears morphologically stable for least > 10 years, and  was previously biopsied in 06/12/2010. Assuming a benign pathologic diagnosis, repeat sampling and/or dedicated follow-up is not recommended. _________________________________________________________ No residual or recurrent tissue within the RIGHT thyroidectomy bed. additional sub-1 cm LEFT inferior pole cyst does not meet threshold for follow-up nor biopsy per current criteria. No cervical adenopathy or abnormal fluid collection within the imaged neck. IMPRESSION: 1. No residual recurrent tissue within the RIGHT thyroidectomy  bed. 2. 2.3 cm LEFT mid thyroid  nodule correlates with lesion seen on comparison CT. This nodule has been stable for > 10 years, and was previously biopsied in 06/12/2010. Assuming a benign pathologic diagnosis, repeat sampling and/or dedicated follow-up is not recommended. The above is in keeping with the ACR TI-RADS recommendations - J Am Coll Radiol 2017;14:587-595. Electronically Signed   By: Thom Hall M.D.   On: 05/14/2024 07:19   ECHOCARDIOGRAM COMPLETE Result Date: 05/13/2024    ECHOCARDIOGRAM REPORT   Patient Name:   [name] Charley Date of Exam: 05/13/2024 Medical Rec #:  [id number]            Height:       64.0 in Accession #:    [id number]           Weight:       239.8 lb Date of Birth:  05-25-1941            BSA:          2.113 m Patient Age:    83 years             BP:           110/52 mmHg Patient Gender: F                    HR:           82 bpm. Exam Location:  Inpatient Procedure: 2D Echo (Both Spectral and Color Flow Doppler were utilized during            procedure). Indications:    Afib  History:        Patient has prior history of Echocardiogram examinations.                 Arrythmias:Atrial Fibrillation.  Sonographer:    Norleen Amour Referring Phys: [id number] KELLY A GRIFFITH IMPRESSIONS  1. Left ventricular ejection fraction, by estimation, is 55 to 60%. The left ventricle has normal function. The left ventricle has no regional wall motion abnormalities. There  is mild left ventricular hypertrophy. Left ventricular diastolic parameters are indeterminate.  2. Right ventricular systolic function is normal. The right ventricular size is normal. Tricuspid regurgitation signal is inadequate for assessing PA pressure.  3. Left atrial size was moderately dilated.  4. Right atrial size was moderately dilated.  5. The mitral valve is normal in structure. Trivial mitral valve regurgitation. No evidence of mitral stenosis.  6. The aortic valve is tricuspid. Aortic valve regurgitation is not visualized. Aortic valve sclerosis/calcification is present, without any evidence of aortic stenosis.  7. The inferior vena cava is normal in size with greater than 50% respiratory variability, suggesting right atrial pressure of 3 mmHg. FINDINGS  Left Ventricle: Left ventricular ejection fraction, by estimation, is 55 to 60%. The left ventricle has normal function. The left ventricle has no regional wall motion abnormalities. The left ventricular internal cavity size was normal in size. There is  mild left ventricular hypertrophy. Left ventricular diastolic function could not be evaluated due to atrial fibrillation. Left ventricular diastolic parameters are indeterminate. Right Ventricle: The right ventricular size is normal. Right ventricular systolic function is normal. Tricuspid regurgitation signal is inadequate for assessing PA pressure. The tricuspid regurgitant velocity is 2.02 m/s, and with an assumed right atrial  pressure of 3 mmHg, the estimated right ventricular systolic pressure is 19.3 mmHg. Left Atrium: Left atrial size was moderately dilated. Right Atrium: Right atrial size was moderately dilated. Pericardium: There  is no evidence of pericardial effusion. Mitral Valve: The mitral valve is normal in structure. Trivial mitral valve regurgitation. No evidence of mitral valve stenosis. MV peak gradient, 5.8 mmHg. The mean mitral valve gradient is 2.0 mmHg. Tricuspid Valve: The  tricuspid valve is normal in structure. Tricuspid valve regurgitation is trivial. No evidence of tricuspid stenosis. Aortic Valve: The aortic valve is tricuspid. Aortic valve regurgitation is not visualized. Aortic valve sclerosis/calcification is present, without any evidence of aortic stenosis. Aortic valve mean gradient measures 5.0 mmHg. Aortic valve peak gradient measures 7.6 mmHg. Pulmonic Valve: The pulmonic valve was not well visualized. Pulmonic valve regurgitation is not visualized. No evidence of pulmonic stenosis. Aorta: The aortic root is normal in size and structure. Venous: The inferior vena cava is normal in size with greater than 50% respiratory variability, suggesting right atrial pressure of 3 mmHg. IAS/Shunts: No atrial level shunt detected by color flow Doppler.  LEFT VENTRICLE PLAX 2D LVIDd:         5.00 cm     Diastology LVIDs:         3.20 cm     LV e' medial:    8.63 cm/s LV PW:         1.30 cm     LV E/e' medial:  13.2 LV IVS:        0.90 cm     LV e' lateral:   10.10 cm/s LVOT diam:     1.90 cm     LV E/e' lateral: 11.3 LV SV:         32 LV SV Index:   15 LVOT Area:     2.84 cm  LV Volumes (MOD) LV vol d, MOD A2C: 93.4 ml LV vol d, MOD A4C: 97.6 ml LV vol s, MOD A2C: 39.8 ml LV vol s, MOD A4C: 40.2 ml LV SV MOD A2C:     53.6 ml LV SV MOD A4C:     97.6 ml LV SV MOD BP:      58.5 ml RIGHT VENTRICLE             IVC RV Basal diam:  4.00 cm     IVC diam: 1.90 cm RV S prime:     10.10 cm/s TAPSE (M-mode): 1.7 cm      PULMONARY VEINS                             Diastolic Velocity: 63.30 cm/s                             S/D Velocity:       0.40                             Systolic Velocity:  26.80 cm/s LEFT ATRIUM             Index        RIGHT ATRIUM           Index LA diam:        3.90 cm 1.85 cm/m   RA Area:     26.80 cm LA Vol (A2C):   78.5 ml 37.15 ml/m  RA Volume:   89.20 ml  42.21 ml/m LA Vol (A4C):   67.1 ml 31.75 ml/m LA Biplane Vol: 76.4 ml 36.15 ml/m  AORTIC VALVE  PULMONIC VALVE AV Area (Vmax):    1.30 cm      PV Vmax:       0.68 m/s AV Area (Vmean):   1.17 cm      PV Peak grad:  1.9 mmHg AV Area (VTI):     1.42 cm AV Vmax:           138.00 cm/s AV Vmean:          103.000 cm/s AV VTI:            0.227 m AV Peak Grad:      7.6 mmHg AV Mean Grad:      5.0 mmHg LVOT Vmax:         63.10 cm/s LVOT Vmean:        42.400 cm/s LVOT VTI:          0.114 m LVOT/AV VTI ratio: 0.50  AORTA Ao Root diam: 2.90 cm Ao Asc diam:  3.00 cm MITRAL VALVE                TRICUSPID VALVE MV Area (PHT): 4.82 cm     TR Peak grad:   16.3 mmHg MV Area VTI:   1.08 cm     TR Vmax:        202.00 cm/s MV Peak grad:  5.8 mmHg MV Mean grad:  2.0 mmHg     SHUNTS MV Vmax:       1.20 m/s     Systemic VTI:  0.11 m MV Vmean:      57.7 cm/s    Systemic Diam: 1.90 cm MV Decel Time: 157 msec MV E velocity: 113.67 cm/s MV A velocity: 45.60 cm/s MV E/A ratio:  2.49 Redell Shallow MD Electronically signed by Redell Shallow MD Signature Date/Time: 05/13/2024/10:35:19 AM    Final    CT Angio Chest/Abd/Pel for Dissection W and/or Wo Contrast Result Date: 05/12/2024 CLINICAL DATA:  Acute aortic syndrome suspected. EXAM: CT ANGIOGRAPHY CHEST, ABDOMEN AND PELVIS TECHNIQUE: Non-contrast CT of the chest was initially obtained. Multidetector CT imaging through the chest, abdomen and pelvis was performed using the standard protocol during bolus administration of intravenous contrast. Multiplanar reconstructed images and MIPs were obtained and reviewed to evaluate the vascular anatomy. RADIATION DOSE REDUCTION: This exam was performed according to the departmental dose-optimization program which includes automated exposure control, adjustment of the mA and/or kV according to patient size and/or use of iterative reconstruction technique. CONTRAST:  OMNIPAQUE  IOHEXOL  350 MG/ML SOLN COMPARISON:  CT abdomen and pelvis 04/12/2018 FINDINGS: CTA CHEST FINDINGS Cardiovascular: Heart is enlarged. There is satisfactory  opacification of the thoracic aorta. The aorta is normal in size. There is no evidence for dissection or aneurysm. There are mild calcified atherosclerotic plaque throughout the aorta and coronary arteries. There is no pericardial effusion. Mediastinum/Nodes: There are enlarged mediastinal and hilar lymph nodes. There is a 16 mm hypodense nodule in the left thyroid . Visualized esophagus is within normal limits. Lungs/Pleura: There are atelectatic changes in the lung bases and lingula. There is no new focal lung infiltrate, pleural effusion or pneumothorax. There are calcified granulomas in the right lung. Musculoskeletal: Degenerative changes affect the spine. Review of the MIP images confirms the above findings. CTA ABDOMEN AND PELVIS FINDINGS VASCULAR Aorta: Normal caliber aorta without aneurysm, dissection, vasculitis or significant stenosis. There are atherosclerotic calcifications of the aorta. Celiac: Patent without evidence of aneurysm, dissection, vasculitis or significant stenosis. SMA: Patent without evidence of aneurysm, dissection, vasculitis or significant stenosis.  Renals: Both renal arteries are patent without evidence of aneurysm, dissection, vasculitis, fibromuscular dysplasia or significant stenosis. IMA: Patent without evidence of aneurysm, dissection, vasculitis or significant stenosis. Inflow: Patent without evidence of aneurysm, dissection, vasculitis or significant stenosis. Veins: No obvious venous abnormality within the limitations of this arterial phase study. Review of the MIP images confirms the above findings. NON-VASCULAR Hepatobiliary: No focal liver abnormality is seen. Status post cholecystectomy. No biliary dilatation. There is a calcified granuloma in the left lobe. Pancreas: Unremarkable. No pancreatic ductal dilatation or surrounding inflammatory changes. Spleen: Normal in size without focal abnormality. Adrenals/Urinary Tract: Adrenal glands are unremarkable. Kidneys are normal,  without renal calculi, focal lesion, or hydronephrosis. Bladder is unremarkable. Stomach/Bowel: Stomach is within normal limits. No evidence of bowel wall thickening, distention, or inflammatory changes. There is sigmoid colon diverticulosis. The appendix is not visualized. Lymphatic: There are mildly enlarged and nonenlarged left retroperitoneal lymph nodes measuring up to 1 cm. There are prominent and mildly enlarged iliac chain lymph nodes measuring up to 1 cm. There are enlarged bilateral inguinal lymph nodes measuring up to 18 mm short axis. Reproductive: Bilateral ovaries are within normal limits. Uterus is surgically absent. Other: There is no ascites or free air. There is a moderate-sized fat containing umbilical hernia. There is lower anterior abdominal wall subcutaneous stranding and skin thickening. Musculoskeletal: No acute or significant osseous findings. Review of the MIP images confirms the above findings. IMPRESSION: 1. No evidence for aortic dissection or aneurysm. 2. Cardiomegaly. 3. Mediastinal, hilar, retroperitoneal and inguinal lymphadenopathy. Findings are worrisome for metastatic disease or lymphoma. 4. Lower anterior abdominal wall subcutaneous stranding and skin thickening. Correlate clinically for cellulitis. 5. Colonic diverticulosis. 6. Moderate-sized fat containing umbilical hernia. 7. 16 mm left thyroid  nodule, indeterminate. Recommend thyroid  US  (ref: J Am Coll Radiol. 2015 Feb;12(2): 143-50). 8. Aortic atherosclerosis. Aortic Atherosclerosis (ICD10-I70.0). Electronically Signed   By: Greig Pique M.D.   On: 05/12/2024 18:50   DG Chest 2 View Result Date: 05/12/2024 CLINICAL DATA:  chest pain EXAM: CHEST - 2 VIEW COMPARISON:  August 13, 2023 FINDINGS: No focal airspace consolidation, pleural effusion, or pneumothorax. No cardiomegaly. Calcified hilar lymph nodes. Tortuous aorta with aortic atherosclerosis. No acute fracture or destructive lesions. Multilevel thoracic osteophytosis.  IMPRESSION: No acute cardiopulmonary abnormality. Electronically Signed   By: Rogelia Myers M.D.   On: 05/12/2024 16:49       The results of significant diagnostics from this hospitalization (including imaging, microbiology, ancillary and laboratory) are listed below for reference.     Microbiology: Recent Results (from the past 240 hours)  Resp panel by RT-PCR (RSV, Flu A&B, Covid) Anterior Nasal Swab     Status: None   Collection Time: 05/12/24  6:04 PM   Specimen: Anterior Nasal Swab  Result Value Ref Range Status   SARS Coronavirus 2 by RT PCR NEGATIVE NEGATIVE Final    Comment: (NOTE) SARS-CoV-2 target nucleic acids are NOT DETECTED.  The SARS-CoV-2 RNA is generally detectable in upper respiratory specimens during the acute phase of infection. The lowest concentration of SARS-CoV-2 viral copies this assay can detect is 138 copies/mL. A negative result does not preclude SARS-Cov-2 infection and should not be used as the sole basis for treatment or other patient management decisions. A negative result may occur with  improper specimen collection/handling, submission of specimen other than nasopharyngeal swab, presence of viral mutation(s) within the areas targeted by this assay, and inadequate number of viral copies(<138 copies/mL). A negative result must be combined  with clinical observations, patient history, and epidemiological information. The expected result is Negative.  Fact Sheet for Patients:  bloggercourse.com  Fact Sheet for Healthcare Providers:  seriousbroker.it  This test is no t yet approved or cleared by the [place]  FDA and  has been authorized for detection and/or diagnosis of SARS-CoV-2 by FDA under an Emergency Use Authorization (EUA). This EUA will remain  in effect (meaning this test can be used) for the duration of the COVID-19 declaration under Section 564(b)(1) of the Act, 21 U.S.C.section  360bbb-3(b)(1), unless the authorization is terminated  or revoked sooner.       Influenza A by PCR NEGATIVE NEGATIVE Final   Influenza B by PCR NEGATIVE NEGATIVE Final    Comment: (NOTE) The Xpert Xpress SARS-CoV-2/FLU/RSV plus assay is intended as an aid in the diagnosis of influenza from Nasopharyngeal swab specimens and should not be used as a sole basis for treatment. Nasal washings and aspirates are unacceptable for Xpert Xpress SARS-CoV-2/FLU/RSV testing.  Fact Sheet for Patients: bloggercourse.com  Fact Sheet for Healthcare Providers: seriousbroker.it  This test is not yet approved or cleared by the [place]  FDA and has been authorized for detection and/or diagnosis of SARS-CoV-2 by FDA under an Emergency Use Authorization (EUA). This EUA will remain in effect (meaning this test can be used) for the duration of the COVID-19 declaration under Section 564(b)(1) of the Act, 21 U.S.C. section 360bbb-3(b)(1), unless the authorization is terminated or revoked.     Resp Syncytial Virus by PCR NEGATIVE NEGATIVE Final    Comment: (NOTE) Fact Sheet for Patients: bloggercourse.com  Fact Sheet for Healthcare Providers: seriousbroker.it  This test is not yet approved or cleared by the [place]  FDA and has been authorized for detection and/or diagnosis of SARS-CoV-2 by FDA under an Emergency Use Authorization (EUA). This EUA will remain in effect (meaning this test can be used) for the duration of the COVID-19 declaration under Section 564(b)(1) of the Act, 21 U.S.C. section 360bbb-3(b)(1), unless the authorization is terminated or revoked.  Performed at [facility], [address] [place], [place] [address]   Culture, blood (Routine X 2) w Reflex to ID Panel     Status: None (Preliminary result)   Collection Time: 05/14/24  9:12 AM   Specimen: BLOOD  RIGHT ARM  Result Value Ref Range Status   Specimen Description BLOOD RIGHT ARM  Final   Special Requests   Final    BOTTLES DRAWN AEROBIC AND ANAEROBIC Blood Culture adequate volume   Culture   Final    NO GROWTH 2 DAYS Performed at [facility], [address], [place], [place] [address]    Report Status PENDING  Incomplete  Culture, blood (Routine X 2) w Reflex to ID Panel     Status: None (Preliminary result)   Collection Time: 05/14/24  9:19 AM   Specimen: BLOOD LEFT ARM  Result Value Ref Range Status   Specimen Description BLOOD LEFT ARM  Final   Special Requests   Final    BOTTLES DRAWN AEROBIC AND ANAEROBIC Blood Culture adequate volume   Culture   Final    NO GROWTH 2 DAYS Performed at [facility], [address] [place], [place] [address]    Report Status PENDING  Incomplete     Labs:  CBC: Recent Labs  Lab 05/12/24 1548 05/13/24 0412 05/14/24 0912 05/15/24 0458 05/16/24 0642  WBC 11.2* 15.8* 12.0* 11.1* 8.6  NEUTROABS  --   --  8.7*  --   --   HGB 12.7 11.8* 12.2 11.2* 12.0  HCT 39.2 35.5* 37.3 35.1* 37.1  MCV 90.5 91.0 91.6 92.6 91.6  PLT 271 263 267 245 258   BMP &GFR Recent Labs  Lab 05/12/24 1548 05/13/24 0412 05/14/24 0912 05/15/24 0458 05/16/24 0642  NA 134* 136 136 137 140  K 4.2 3.9 3.8 3.7 3.6  CL 97* 97* 97* 100 99  CO2 26 30 31 29 28   GLUCOSE 183* 141* 97 99 113*  BUN 16 13 19 21 18   CREATININE 0.90 0.94 0.93 0.89 0.80  CALCIUM  9.6 8.6* 8.6* 8.1* 8.4*  MG  --   --  2.0 2.1 2.0  PHOS  --   --   --  3.7 3.5   Estimated Creatinine Clearance: 61.3 mL/min (by C-G formula based on SCr of 0.8 mg/dL). Liver & Pancreas: Recent Labs  Lab 05/14/24 0912 05/15/24 0458 05/16/24 0642  AST 21  --   --   ALT 20  --   --   ALKPHOS 80  --   --   BILITOT 1.1  --   --   PROT 7.4  --   --   ALBUMIN 2.3* 2.0* 2.1*   No results for input(s): LIPASE, AMYLASE in the last 168 hours. No results for input(s): AMMONIA in the last  168 hours. Diabetic: No results for input(s): HGBA1C in the last 72 hours. No results for input(s): GLUCAP in the last 168 hours. Cardiac Enzymes: No results for input(s): CKTOTAL, CKMB, CKMBINDEX, TROPONINI in the last 168 hours. Recent Labs    05/12/24 1806  PROBNP 1,710.0*   Coagulation Profile: Recent Labs  Lab 05/12/24 1758 05/13/24 0412 05/14/24 0408 05/15/24 0458 05/16/24 0642  INR 3.6* 3.2* 2.3* 1.9* 1.8*   Thyroid  Function Tests: Recent Labs    05/13/24 1944  TSH 3.485   Lipid Profile: No results for input(s): CHOL, HDL, LDLCALC, TRIG, CHOLHDL, LDLDIRECT in the last 72 hours. Anemia Panel: No results for input(s): VITAMINB12, FOLATE, FERRITIN, TIBC, IRON, RETICCTPCT in the last 72 hours. Urine analysis:    Component Value Date/Time   COLORURINE COLORLESS (A) 09/20/2022 1812   APPEARANCEUR CLEAR 09/20/2022 1812   LABSPEC 1.005 09/20/2022 1812   PHURINE 5.0 09/20/2022 1812   GLUCOSEU NEGATIVE 09/20/2022 1812   HGBUR NEGATIVE 09/20/2022 1812   BILIRUBINUR NEGATIVE 09/20/2022 1812   KETONESUR NEGATIVE 09/20/2022 1812   PROTEINUR NEGATIVE 09/20/2022 1812   UROBILINOGEN 0.2 06/02/2007 1024   NITRITE NEGATIVE 09/20/2022 1812   LEUKOCYTESUR NEGATIVE 09/20/2022 1812   Sepsis Labs: Invalid input(s): PROCALCITONIN, LACTICIDVEN   SIGNED:  Matteo Banke T Zalma Channing, MD  [facility] 05/16/2024, 1:04 PM

## 2024-05-16 NOTE — Progress Notes (Addendum)
 Progress Note  Patient Name: [name] Date of Encounter: 05/16/2024 [facility] Cardiologist: Madonna Large, DO   Interval Summary    Patient feeling well this morning.  Denies chest pain, shortness of breath, palpitations.  She remains in atrial fibrillation with heart rate well-controlled in the 80s-90s.  She is currently on 2 L supplemental oxygen  via nasal cannula.  She does not wear oxygen  at home.  She does not feel short of breath, no orthopnea.  Vital Signs Vitals:   05/15/24 1559 05/15/24 2326 05/16/24 0000 05/16/24 0347  BP: 132/69   124/63  Pulse: 69 86 88 72  Resp: 16   16  Temp: 99.1 F (37.3 C)   98.4 F (36.9 C)  TempSrc: Oral   Oral  SpO2: 95% 96% 93% 94%  Weight:    106.6 kg  Height:        Intake/Output Summary (Last 24 hours) at 05/16/2024 0729 Last data filed at 05/16/2024 0604 Gross per 24 hour  Intake --  Output 600 ml  Net -600 ml      05/16/2024    3:47 AM 05/15/2024    3:17 AM 05/14/2024    4:38 AM  Last 3 Weights  Weight (lbs) 235 lb 234 lb 4.8 oz 238 lb 1.6 oz  Weight (kg) 106.595 kg 106.278 kg 108 kg      Telemetry/ECG  Atrial fibrillation with heart rate in the 80s-90s- Personally Reviewed  Physical Exam  GEN: No acute distress.  Laying flat in the bed Neck: No JVD Cardiac: Irregular rate and rhythm, no murmurs, rubs, or gallops.  Respiratory: Clear to auscultation bilaterally.  Normal work of breathing on 2 L via nasal cannula GI: Soft, nontender, non-distended  MS: No edema in bilateral lower extremities, both legs wrapped in Ace bandages.  Assessment & Plan    Permanent atrial fibrillation - In the past, patient has refused antiarrhythmic medications. Has been rate controlled with diltiazem , nebivolol   - Heart rate well-controlled.  Patient denies palpitations -Continue Cardizem  CD 360 mg daily, Nebivolol  5 mg daily - Continue coumadin .  Goal INR 2-3.  This morning, INR 1.8.  Pharmacy following to adjust  Coumadin  dose.  Of note, patient was on Xarelto  in the past.  This was discontinued for unclear reasons.  Chest pain - hsTn 21>22  - Echo this admission showed EF 55-60%, no regional wall  motion abnormalities, normal RV function. No pericardial effusion  - EKG without significant changes  - CRP elevated to 24.8, sed rate 121.  - Pain has resolved. Patient tells me that when she did have pain, it was worse with deep breathing or coughing. Was not positional. Now completely gone  - With elevated inflammatory markers and pleuritic pain, considered pericarditis. However, EKG not consistent with this. Echo without effusion. As pain has completely resolved, OK to hold off on treatment. Possibly pleuritis associated with whatever process is driving fever and lymphadenopathy  Chronic diastolic heart failure  - CTA chest/abd/pelv on admission showed no edema or pleural effusions. Pro-BNP 1710  - Received IV lasix  20 mg on 12/4 and 12/5. Currently net -3.4 L since admission. Weight down 4 lbs.  - Euvolemic on exam today. Denies shortness of breath, orthopnea  - Resume home PO lasix  40 mg daily   Otherwise per primary  - bilateral lower extremity cellulitis - SIRS  - History of PE/DVT  - Diffuse adenopathy  - Left thyroid  nodule, normal TSH   For questions or updates, please  contact [facility] Please consult [email or web address] for contact info under         Signed, Rollo FABIENE Louder, PA-C   ____________   After reviewing all of her relevant medical information and discussing with the APP, I presented to the room to evaluate the patient however she had been discharged prior to my arrival and therefore I was unable to examine the patient.  Please refer to the APP note as above for management.  Thank you, Emeline Calender, DO

## 2024-05-16 NOTE — TOC Initial Note (Signed)
 Transition of Care [facility]) - Initial/Assessment Note    Patient Details  Name: [name] MRN: [id number] Date of Birth: 21-Jun-1940  [facility] CM/SW Contact:    Sudie Erminio Deems, RN Phone Number: 05/16/2024, 12:23 PM  Clinical Narrative:  Patient presented for atrial fib. PTA patient was from home with spouse. Patient has insurance and PCP. ICM spoke with patient regarding DME and patient states she owns her oxygen  concentrator in the home. Patient states she purchased one off line out of pocket. Patient is in need of oxygen  for home and we discussed that the company will need to deliver a concentrator to the home so they can service. [employer] is aware that the patient will need a POC for the home assessment. Portable 02 tank will be delivered to the home. Patient declined home health RN for wound care. ICM did speak with WOC to see an alternative to Vashe and it will be normal saline. Patient states she will be able to continue dressing changes. Patient states she no longer visits the wound care center and her PCP appointment is not until March. MD is aware that patient has declined [facility] RN for wound care. No further needs identified at this time.   Expected Discharge Plan: Home/Self Care Barriers to Discharge: No Barriers Identified   Patient Goals and CMS Choice Patient states their goals for this hospitalization and ongoing recovery are:: Plans to return home   Choice offered to / list presented to : NA      Expected Discharge Plan and Services   Discharge Planning Services: CM Consult Post Acute Care Choice: Durable Medical Equipment Living arrangements for the past 2 months: Single Family Home Expected Discharge Date: 05/16/24               DME Arranged: Oxygen  DME Agency: [facility] Date DME Agency Contacted: 05/16/24 Time DME Agency Contacted: 1215 Representative spoke with at DME Agency: [facility] Arranged: Refused HH          Prior  Living Arrangements/Services Living arrangements for the past 2 months: Single Family Home Lives with:: Spouse Patient language and need for interpreter reviewed:: Yes Do you feel safe going back to the place where you live?: Yes      Need for Family Participation in Patient Care: Yes (Comment) Care giver support system in place?: Yes (comment) Current home services: DME (oxygen  concentrator purchased via online (no insurance)) Criminal Activity/Legal Involvement Pertinent to Current Situation/Hospitalization: No - Comment as needed  Activities of Daily Living   ADL Screening (condition at time of admission) Independently performs ADLs?: Yes (appropriate for developmental age) Is the patient deaf or have difficulty hearing?: No Does the patient have difficulty seeing, even when wearing glasses/contacts?: Yes Does the patient have difficulty concentrating, remembering, or making decisions?: No  Permission Sought/Granted Permission sought to share information with : Family Supports, Case Production Designer, Theatre/television/film, Photographer granted to share info w AGENCY: [employer]        Emotional Assessment Appearance:: Appears stated age Attitude/Demeanor/Rapport: Engaged Affect (typically observed): Appropriate Orientation: : Oriented to Self, Oriented to Place, Oriented to  Time, Oriented to Situation Alcohol  / Substance Use: Not Applicable Psych Involvement: No (comment)  Admission diagnosis:  Hypoxia [R09.02] Atrial fibrillation with RVR (HCC) [I48.91] Chest pain, unspecified type [R07.9] Patient Active Problem List   Diagnosis Date Noted   Atrial fibrillation with RVR (HCC) 05/12/2024   Atypical chest pain 05/12/2024  Acute on chronic heart failure with preserved ejection fraction (HFpEF) (HCC) 09/26/2022   Permanent atrial fibrillation (HCC) 09/26/2022   Warfarin anticoagulation 09/18/2022   Sepsis (HCC) 09/18/2022   Sepsis due to cellulitis (HCC) 09/18/2022    Lower extremity cellulitis 09/18/2022   Chronic intermittent hypoxia with obstructive sleep apnea 09/12/2021   Severe obstructive sleep apnea-hypopnea syndrome 09/12/2021   Acute medial meniscus tear of right knee 03/31/2018   Acute pain of right knee 01/12/2018   Hypercoagulation syndrome 05/24/2014   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 [place] [address] Phone: [phone] Fax: [phone]  [facility] [address] [place] [address] Phone: [phone] Fax: [phone]     Social Drivers of Health (SDOH) Social History: SDOH Screenings   Food Insecurity: No Food Insecurity (05/13/2024)  Housing: Low Risk  (05/13/2024)  Transportation Needs: No Transportation Needs (05/13/2024)  Utilities: Not At Risk (05/13/2024)  Social Connections: Moderately Integrated (05/15/2024)  Tobacco Use: Low Risk  (05/13/2024)   SDOH Interventions:     Readmission Risk Interventions    09/19/2022    2:13 PM  Readmission Risk Prevention Plan  Post Dischage Appt Complete  Medication Screening Complete

## 2024-05-17 NOTE — TOC CM/SW Note (Signed)
 [id number] 05-17-24 ICM received notification from [employer] that the patient refused oxygen  delivery on yesterday. Patient will be unable to get a POC or portable oxygen  from [employer]. Portable E tank was picked up by [employer]. No further needs identified at this time.

## 2024-05-18 LAB — SURGICAL PATHOLOGY

## 2024-05-19 ENCOUNTER — Other Ambulatory Visit (HOSPITAL_COMMUNITY): Payer: Self-pay

## 2024-05-19 LAB — CULTURE, BLOOD (ROUTINE X 2)
Culture: NO GROWTH
Culture: NO GROWTH
Special Requests: ADEQUATE
Special Requests: ADEQUATE

## 2024-05-27 ENCOUNTER — Inpatient Hospital Stay

## 2024-05-27 ENCOUNTER — Inpatient Hospital Stay: Attending: Hematology and Oncology | Admitting: Hematology and Oncology

## 2024-05-27 VITALS — BP 125/74 | HR 88 | Temp 98.0°F | Resp 15 | Wt 241.0 lb

## 2024-05-27 DIAGNOSIS — D869 Sarcoidosis, unspecified: Secondary | ICD-10-CM | POA: Diagnosis present

## 2024-05-27 DIAGNOSIS — Z79899 Other long term (current) drug therapy: Secondary | ICD-10-CM | POA: Insufficient documentation

## 2024-05-27 DIAGNOSIS — Z7901 Long term (current) use of anticoagulants: Secondary | ICD-10-CM | POA: Diagnosis not present

## 2024-05-27 DIAGNOSIS — Z86711 Personal history of pulmonary embolism: Secondary | ICD-10-CM | POA: Diagnosis not present

## 2024-05-27 DIAGNOSIS — R591 Generalized enlarged lymph nodes: Secondary | ICD-10-CM | POA: Diagnosis not present

## 2024-05-27 DIAGNOSIS — Z85850 Personal history of malignant neoplasm of thyroid: Secondary | ICD-10-CM | POA: Diagnosis not present

## 2024-05-27 LAB — CBC WITH DIFFERENTIAL (CANCER CENTER ONLY)
Abs Immature Granulocytes: 0.1 K/uL — ABNORMAL HIGH (ref 0.00–0.07)
Basophils Absolute: 0 K/uL (ref 0.0–0.1)
Basophils Relative: 0 %
Eosinophils Absolute: 0 K/uL (ref 0.0–0.5)
Eosinophils Relative: 0 %
HCT: 34.1 % — ABNORMAL LOW (ref 36.0–46.0)
Hemoglobin: 11.1 g/dL — ABNORMAL LOW (ref 12.0–15.0)
Immature Granulocytes: 1 %
Lymphocytes Relative: 8 %
Lymphs Abs: 1.1 K/uL (ref 0.7–4.0)
MCH: 29.2 pg (ref 26.0–34.0)
MCHC: 32.6 g/dL (ref 30.0–36.0)
MCV: 89.7 fL (ref 80.0–100.0)
Monocytes Absolute: 1.8 K/uL — ABNORMAL HIGH (ref 0.1–1.0)
Monocytes Relative: 13 %
Neutro Abs: 11.3 K/uL — ABNORMAL HIGH (ref 1.7–7.7)
Neutrophils Relative %: 78 %
Platelet Count: 405 K/uL — ABNORMAL HIGH (ref 150–400)
RBC: 3.8 MIL/uL — ABNORMAL LOW (ref 3.87–5.11)
RDW: 14.5 % (ref 11.5–15.5)
WBC Count: 14.4 K/uL — ABNORMAL HIGH (ref 4.0–10.5)
nRBC: 0 % (ref 0.0–0.2)

## 2024-05-27 LAB — CMP (CANCER CENTER ONLY)
ALT: 39 U/L (ref 0–44)
AST: 37 U/L (ref 15–41)
Albumin: 3.4 g/dL — ABNORMAL LOW (ref 3.5–5.0)
Alkaline Phosphatase: 141 U/L — ABNORMAL HIGH (ref 38–126)
Anion gap: 12 (ref 5–15)
BUN: 43 mg/dL — ABNORMAL HIGH (ref 8–23)
CO2: 26 mmol/L (ref 22–32)
Calcium: 10.2 mg/dL (ref 8.9–10.3)
Chloride: 96 mmol/L — ABNORMAL LOW (ref 98–111)
Creatinine: 1.25 mg/dL — ABNORMAL HIGH (ref 0.44–1.00)
GFR, Estimated: 42 mL/min — ABNORMAL LOW
Glucose, Bld: 122 mg/dL — ABNORMAL HIGH (ref 70–99)
Potassium: 5.4 mmol/L — ABNORMAL HIGH (ref 3.5–5.1)
Sodium: 133 mmol/L — ABNORMAL LOW (ref 135–145)
Total Bilirubin: 0.5 mg/dL (ref 0.0–1.2)
Total Protein: 8.5 g/dL — ABNORMAL HIGH (ref 6.5–8.1)

## 2024-05-27 LAB — LACTATE DEHYDROGENASE: LDH: 218 U/L (ref 105–235)

## 2024-05-27 LAB — SEDIMENTATION RATE: Sed Rate: 122 mm/h — ABNORMAL HIGH (ref 0–22)

## 2024-05-27 NOTE — Progress Notes (Unsigned)
 " [facility] [phone]   [phone]  INITIAL CONSULT NOTE  Patient Care Team: [name], MD as PCP - General (Internal Medicine) Michele Richardson, DO as PCP - Cardiology (Cardiology) Ezzard Rolin BIRCH, LCSW as Social Worker (Licensed Clinical Social Worker)  Hematological/Oncological History # Diffuse Lymphadenopathy 12/4-12/01/2024: admitted to hospital for chest pain and Afib with RVR. Found to have diffuse lymphadenopathy.  05/16/2024: IR guided right inguinal lymph node biopsy showed fibrotic lymph node with limited lymphoid tissue with a benign immunophenotype  05/27/2024: establish care with Dr. Federico   CHIEF COMPLAINTS/PURPOSE OF CONSULTATION:  Diffuse Lymphadenopathy   HISTORY OF PRESENTING ILLNESS:  [name] 83 y.o. female with medical history significant for thyroid  cancer, hyperlipidemia, hypertension, hypothyroid, anxiety, arthritis, OSA on CPAP, and history of sarcoidosis who presents for evaluation of lymphadenopathy.  On review of the previous records Ms. Amison was admitted to the hospital in early December 2025.  She was in due to chest pain and atrial fibrillation with RVR.  As part of her evaluation she underwent imaging of the chest which showed diffuse lymphadenopathy.  Subsequent abdominal imaging also showed concern for right inguinal lymphadenopathy.  An IR guided biopsy was performed which showed a fibrotic lymph node with limited lymphoid tissue consistent with a benign phenotype.  Due to concern for this finding she was referred to hematology for further evaluation and management.  On exam today discretional reports that she does carry a diagnosis of sarcoidosis.  Additionally she does have lower extremity wounds that are frequently weeping in the care of wound care.  She reports that these are often inflamed that she requires antibiotic therapy.  She reports that she has never undergone any treatment for  her sarcoidosis.  She reports that she has had some temperature since she left the hospital with 1 measuring 101.5.  She has finished her antibiotic therapies since discharge.  She notes that she does have chronic dermatitis of the legs which has been going on for at least 5 years.  On further discussion she reports her mother had hypertension and her father had no known history but died of old age.  She reports that she has 2 sisters that had cancer, 1 uterine 1 breast.  She reports she is a never smoker never drinker and currently worked in [employer] in [employer].  She notes otherwise she has had no runny nose, sore throat, cough.  She has had no nausea, vomiting, or diarrhea.  A full 10 point ROS is otherwise negative.  MEDICAL HISTORY:  Past Medical History:  Diagnosis Date   Anxiety    takes Lorazepam  daily as needed   Arthritis    Cancer (HCC)    thyroid    Cataracts, bilateral    immature   Chronic back pain    compressed vertebrae,takes Fosamax weekly   Family history of adverse reaction to anesthesia    granddaughter gets very sick and mean   History of blood transfusion    no abnormal reaction noted   History of bronchitis 05/2015   History of colon polyps    benign   Hyperlipidemia    takes Zetia  daily   Hypertension    takes Diltiazem  and Avapro  daily   Hypothyroidism    takes Synthroid  daily   Ischemic colitis    hx of-many yrs ago   Joint pain    Joint swelling    OSA on CPAP    05-08-12 AHi was 46, RDI  53. titrated to   9 cm water  with 3 cm flex.-nadir 75%   PE (pulmonary embolism)    takes Coumadin  daily   Peripheral edema    Pneumonia 1992   hx of   Sarcoidosis    Shortness of breath    Urinary frequency    Weakness    left leg.Numbness and tingling.Has sciatica   Wheeze    occaionally. Not new    SURGICAL HISTORY: Past Surgical History:  Procedure Laterality Date   ABDOMINAL HYSTERECTOMY     APPENDECTOMY     BREAST BIOPSY      biopsies on right x 2   BREAST CYST ASPIRATION Left    CHOLECYSTECTOMY N/A 11/29/2015   Procedure: LAPAROSCOPIC CHOLECYSTECTOMY;  Surgeon: Jina Nephew, MD;  Location: [facility] OR;  Service: General;  Laterality: N/A;   COLONOSCOPY     DILATION AND CURETTAGE OF UTERUS     LIVER BIOPSY     MELANOMA EXCISION     removed from left leg   scalene surgery  1972   trying to find out what was wrong with her lungs   THYROID  SURGERY      SOCIAL HISTORY: Social History   Socioeconomic History   Marital status: Married    Spouse name: Not on file   Number of children: Not on file   Years of education: Not on file   Highest education level: Not on file  Occupational History   Occupation: Retired    Associate Professor: RETIRED  Tobacco Use   Smoking status: Never   Smokeless tobacco: Never  Vaping Use   Vaping status: Never Used  Substance and Sexual Activity   Alcohol  use: No   Drug use: No   Sexual activity: Not on file  Other Topics Concern   Not on file  Social History Narrative   Caffeine: 8 oz soda per day   Social Drivers of Health   Tobacco Use: Low Risk (05/13/2024)   Patient History    Smoking Tobacco Use: Never    Smokeless Tobacco Use: Never    Passive Exposure: Not on file  Financial Resource Strain: Not on file  Food Insecurity: No Food Insecurity (05/27/2024)   Epic    Worried About Programme Researcher, Broadcasting/film/video in the Last Year: Never true    [employer] of Food in the Last Year: Never true  Transportation Needs: No Transportation Needs (05/27/2024)   Epic    Lack of Transportation (Medical): No    Lack of Transportation (Non-Medical): No  Physical Activity: Not on file  Stress: Not on file  Social Connections: Moderately Integrated (05/15/2024)   Social Connection and Isolation Panel    Frequency of Communication with Friends and Family: More than three times a week    Frequency of Social Gatherings with Friends and Family: More than three times a week    Attends Religious Services:  1 to 4 times per year    Active Member of [employer] or Organizations: No    Attends Banker Meetings: Never    Marital Status: Married  Catering Manager Violence: Not At Risk (05/27/2024)   Epic    Fear of Current or Ex-Partner: No    Emotionally Abused: No    Physically Abused: No    Sexually Abused: No  Depression (PHQ2-9): Low Risk (05/27/2024)   Depression (PHQ2-9)    PHQ-2 Score: 0  Alcohol  Screen: Not on file  Housing: Unknown (05/27/2024)   Epic    Unable to  Pay for Housing in the Last Year: No    Number of Times Moved in the Last Year: Not on file    Homeless in the Last Year: No  Utilities: Not At Risk (05/27/2024)   Epic    Threatened with loss of utilities: No  Health Literacy: Not on file    FAMILY HISTORY: Family History  Problem Relation Age of Onset   Breast cancer Mother    CVA Mother    Breast cancer Sister    Breast cancer Sister    Heart attack Neg Hx    Sleep apnea Neg Hx     ALLERGIES:  is allergic to atenolol, citalopram hydrobromide, clindamycin /lincomycin, codeine, coenzyme q10, erythromycin, motrin [ibuprofen], statins, latex, and losartan potassium.  MEDICATIONS:  Current Outpatient Medications  Medication Sig Dispense Refill   acetaminophen  (TYLENOL ) 325 MG tablet Take 325-650 mg by mouth 2 (two) times daily as needed for mild pain or headache.     Carboxymethylcellulose Sodium (REFRESH LIQUIGEL OP) Place 1 drop into both eyes at bedtime.     diltiazem  (TIAZAC ) 360 MG 24 hr capsule Take 360 mg by mouth at bedtime.     furosemide  (LASIX ) 40 MG tablet Take 40 mg by mouth daily.     irbesartan  (AVAPRO ) 75 MG tablet Take 1 tablet (75 mg total) by mouth daily. 90 tablet 0   levothyroxine  (SYNTHROID ) 112 MCG tablet Take 112 mcg by mouth every morning.     LORazepam  (ATIVAN ) 1 MG tablet Take 1.5 mg by mouth See admin instructions. Take 1.5 mg by mouth at bedtime and an additional 1-1.5mg  once a day as needed for anxiety     nebivolol   (BYSTOLIC ) 5 MG tablet Take 1 tablet (5 mg total) by mouth daily. (Patient taking differently: Take 5 mg by mouth daily at 12 noon.) 30 tablet 0   senna-docusate (SENOKOT-S) 8.6-50 MG tablet Take 1-2 tablets by mouth 2 (two) times daily between meals as needed for mild constipation or moderate constipation.     SIMPLY SALINE NA Place 1 spray into both nostrils as needed (for dryness).     triamcinolone cream (KENALOG) 0.1 % Apply 1 Application topically See admin instructions. Apply to both legs 2 times a day     warfarin (COUMADIN ) 5 MG tablet Take 5 mg by mouth at bedtime.  2   No current facility-administered medications for this visit.    REVIEW OF SYSTEMS:   Constitutional: ( - ) fevers, ( - )  chills , ( - ) night sweats Eyes: ( - ) blurriness of vision, ( - ) double vision, ( - ) watery eyes Ears, nose, mouth, throat, and face: ( - ) mucositis, ( - ) sore throat Respiratory: ( - ) cough, ( - ) dyspnea, ( - ) wheezes Cardiovascular: ( - ) palpitation, ( - ) chest discomfort, ( - ) lower extremity swelling Gastrointestinal:  ( - ) nausea, ( - ) heartburn, ( - ) change in bowel habits Skin: ( - ) abnormal skin rashes Lymphatics: ( - ) new lymphadenopathy, ( - ) easy bruising Neurological: ( - ) numbness, ( - ) tingling, ( - ) new weaknesses Behavioral/Psych: ( - ) mood change, ( - ) new changes  All other systems were reviewed with the patient and are negative.  PHYSICAL EXAMINATION:  Vitals:   05/27/24 1323 05/27/24 1425  BP: (!) 139/98 125/74  Pulse: 88   Resp: 15   Temp: 98 F (36.7 C)   SpO2:  93%    Filed Weights   05/27/24 1323  Weight: 241 lb (109.3 kg)    GENERAL: well appearing elderly Caucasian female in NAD  SKIN: skin color, texture, turgor are normal, no rashes or significant lesions EYES: conjunctiva are pink and non-injected, sclera clear LUNGS: clear to auscultation and percussion with normal breathing effort HEART: regular rate & rhythm and no murmurs and  no lower extremity edema Musculoskeletal: no cyanosis of digits and no clubbing  PSYCH: alert & oriented x 3, fluent speech NEURO: no focal motor/sensory deficits  LABORATORY DATA:  I have reviewed the data as listed    Latest Ref Rng & Units 05/27/2024    2:09 PM 05/16/2024    6:42 AM 05/15/2024    4:58 AM  CBC  WBC 4.0 - 10.5 K/uL 14.4  8.6  11.1   Hemoglobin 12.0 - 15.0 g/dL 88.8  87.9  88.7   Hematocrit 36.0 - 46.0 % 34.1  37.1  35.1   Platelets 150 - 400 K/uL 405  258  245        Latest Ref Rng & Units 05/27/2024    2:09 PM 05/16/2024    6:42 AM 05/15/2024    4:58 AM  CMP  Glucose 70 - 99 mg/dL 877  886  99   BUN 8 - 23 mg/dL 43  18  21   Creatinine 0.44 - 1.00 mg/dL 8.74  9.19  9.10   Sodium 135 - 145 mmol/L 133  140  137   Potassium 3.5 - 5.1 mmol/L 5.4  3.6  3.7   Chloride 98 - 111 mmol/L 96  99  100   CO2 22 - 32 mmol/L 26  28  29    Calcium  8.9 - 10.3 mg/dL 89.7  8.4  8.1   Total Protein 6.5 - 8.1 g/dL 8.5     Total Bilirubin 0.0 - 1.2 mg/dL 0.5     Alkaline Phos 38 - 126 U/L 141     AST 15 - 41 U/L 37     ALT 0 - 44 U/L 39        ASSESSMENT & PLAN Shawnell T Kulpa 83 y.o. female with medical history significant for thyroid  cancer, hyperlipidemia, hypertension, hypothyroid, anxiety, arthritis, OSA on CPAP, and history of sarcoidosis who presents for evaluation of lymphadenopathy.  After review of the labs, review of the records, and discussion with the patient the patients findings are most consistent with benign lymphadenopathy most consistent with chronic inflammation from her lower extremity wounds and possibly her sarcoidosis.  # Diffuse Lymphadenopathy -- Markedly low suspicion that this is a malignant process as the biopsy showed benign chronic inflammation in the lymph node.  Additionally she has a history of sarcoidosis and lower extremity wounds which could be the cause of her lymphadenopathy. -- In order to help effectively rule out malignancy would  recommend ordering a PET CT scan. -- Will also order CBC, CMP, and inflammatory markers with CRP and ESR. -- Return to clinic if there are findings concerning for malignancy.  Orders Placed This Encounter  Procedures   NM PET Image Initial (PI) Skull Base To Thigh (F-18 FDG)    Standing Status:   Future    Expected Date:   06/05/2024    Expiration Date:   05/29/2025    If indicated for the ordered procedure, I authorize the administration of a radiopharmaceutical per Radiology protocol:   Yes    Preferred imaging location?:   [facility]  CBC with Differential (Cancer Center Only)    Standing Status:   Future    Number of Occurrences:   1    Expiration Date:   05/27/2025   CMP (Cancer Center only)    Standing Status:   Future    Number of Occurrences:   1    Expiration Date:   05/27/2025   Lactate dehydrogenase (LDH)    Standing Status:   Future    Number of Occurrences:   1    Expiration Date:   05/27/2025   Multiple Myeloma Panel (SPEP&IFE w/QIG)    Standing Status:   Future    Number of Occurrences:   1    Expiration Date:   05/27/2025   Kappa/lambda light chains    Standing Status:   Future    Number of Occurrences:   1    Expiration Date:   05/27/2025   Flow Cytometry, Peripheral Blood (Oncology)    Standing Status:   Future    Number of Occurrences:   1    Expected Date:   05/27/2024    Expiration Date:   05/27/2025   Sedimentation rate    Standing Status:   Future    Number of Occurrences:   1    Expiration Date:   05/27/2025   C-reactive protein    Standing Status:   Future    Number of Occurrences:   1    Expiration Date:   05/27/2025    All questions were answered. The patient knows to call the clinic with any problems, questions or concerns.  A total of more than 60 minutes were spent on this encounter with face-to-face time and non-face-to-face time, including preparing to see the patient, ordering tests and/or medications, counseling the patient and  coordination of care as outlined above.   Norleen IVAR Kidney, MD Department of Hematology/Oncology [facility] Phone: [phone] Pager: [phone] Email: [email or web address]  05/29/2024 3:10 PM  "

## 2024-05-28 LAB — C-REACTIVE PROTEIN: CRP: 12.3 mg/dL — ABNORMAL HIGH

## 2024-05-30 LAB — KAPPA/LAMBDA LIGHT CHAINS
Kappa free light chain: 60.5 mg/L — ABNORMAL HIGH (ref 3.3–19.4)
Kappa, lambda light chain ratio: 0.98 (ref 0.26–1.65)
Lambda free light chains: 61.5 mg/L — ABNORMAL HIGH (ref 5.7–26.3)

## 2024-05-30 LAB — SURGICAL PATHOLOGY

## 2024-05-31 LAB — FLOW CYTOMETRY

## 2024-06-01 LAB — MULTIPLE MYELOMA PANEL, SERUM
Albumin SerPl Elph-Mcnc: 2.7 g/dL — ABNORMAL LOW (ref 2.9–4.4)
Albumin/Glob SerPl: 0.6 — ABNORMAL LOW (ref 0.7–1.7)
Alpha 1: 0.4 g/dL (ref 0.0–0.4)
Alpha2 Glob SerPl Elph-Mcnc: 0.9 g/dL (ref 0.4–1.0)
B-Globulin SerPl Elph-Mcnc: 1.3 g/dL (ref 0.7–1.3)
Gamma Glob SerPl Elph-Mcnc: 2.2 g/dL — ABNORMAL HIGH (ref 0.4–1.8)
Globulin, Total: 4.8 g/dL — ABNORMAL HIGH (ref 2.2–3.9)
IgA: 1284 mg/dL — ABNORMAL HIGH (ref 64–422)
IgG (Immunoglobin G), Serum: 1688 mg/dL — ABNORMAL HIGH (ref 586–1602)
IgM (Immunoglobulin M), Srm: 372 mg/dL — ABNORMAL HIGH (ref 26–217)
Total Protein ELP: 7.5 g/dL (ref 6.0–8.5)

## 2024-06-10 ENCOUNTER — Emergency Department (HOSPITAL_COMMUNITY)

## 2024-06-10 ENCOUNTER — Encounter (HOSPITAL_COMMUNITY): Payer: Self-pay

## 2024-06-10 ENCOUNTER — Other Ambulatory Visit: Payer: Self-pay

## 2024-06-10 ENCOUNTER — Encounter (HOSPITAL_COMMUNITY): Admission: RE | Admit: 2024-06-10 | Source: Ambulatory Visit

## 2024-06-10 ENCOUNTER — Inpatient Hospital Stay (HOSPITAL_COMMUNITY)
Admission: EM | Admit: 2024-06-10 | Discharge: 2024-06-20 | DRG: 871 | Disposition: A | Discharge status: Skilled Nursing Facility | Attending: Internal Medicine | Admitting: Internal Medicine

## 2024-06-10 DIAGNOSIS — R238 Other skin changes: Secondary | ICD-10-CM | POA: Diagnosis not present

## 2024-06-10 DIAGNOSIS — E8721 Acute metabolic acidosis: Secondary | ICD-10-CM | POA: Diagnosis not present

## 2024-06-10 DIAGNOSIS — E860 Dehydration: Secondary | ICD-10-CM | POA: Diagnosis present

## 2024-06-10 DIAGNOSIS — Z6841 Body Mass Index (BMI) 40.0 and over, adult: Secondary | ICD-10-CM | POA: Diagnosis not present

## 2024-06-10 DIAGNOSIS — I9589 Other hypotension: Secondary | ICD-10-CM | POA: Diagnosis present

## 2024-06-10 DIAGNOSIS — R7989 Other specified abnormal findings of blood chemistry: Secondary | ICD-10-CM

## 2024-06-10 DIAGNOSIS — K921 Melena: Secondary | ICD-10-CM | POA: Diagnosis present

## 2024-06-10 DIAGNOSIS — Z90710 Acquired absence of both cervix and uterus: Secondary | ICD-10-CM

## 2024-06-10 DIAGNOSIS — E119 Type 2 diabetes mellitus without complications: Secondary | ICD-10-CM | POA: Diagnosis present

## 2024-06-10 DIAGNOSIS — N170 Acute kidney failure with tubular necrosis: Secondary | ICD-10-CM | POA: Diagnosis present

## 2024-06-10 DIAGNOSIS — Z1152 Encounter for screening for COVID-19: Secondary | ICD-10-CM

## 2024-06-10 DIAGNOSIS — R6521 Severe sepsis with septic shock: Secondary | ICD-10-CM | POA: Diagnosis present

## 2024-06-10 DIAGNOSIS — G4733 Obstructive sleep apnea (adult) (pediatric): Secondary | ICD-10-CM | POA: Diagnosis not present

## 2024-06-10 DIAGNOSIS — Z79890 Hormone replacement therapy (postmenopausal): Secondary | ICD-10-CM

## 2024-06-10 DIAGNOSIS — Z515 Encounter for palliative care: Secondary | ICD-10-CM | POA: Diagnosis not present

## 2024-06-10 DIAGNOSIS — E785 Hyperlipidemia, unspecified: Secondary | ICD-10-CM | POA: Diagnosis present

## 2024-06-10 DIAGNOSIS — I4821 Permanent atrial fibrillation: Secondary | ICD-10-CM | POA: Diagnosis present

## 2024-06-10 DIAGNOSIS — I429 Cardiomyopathy, unspecified: Secondary | ICD-10-CM | POA: Diagnosis present

## 2024-06-10 DIAGNOSIS — N179 Acute kidney failure, unspecified: Secondary | ICD-10-CM | POA: Diagnosis not present

## 2024-06-10 DIAGNOSIS — R918 Other nonspecific abnormal finding of lung field: Secondary | ICD-10-CM

## 2024-06-10 DIAGNOSIS — R7881 Bacteremia: Secondary | ICD-10-CM | POA: Diagnosis not present

## 2024-06-10 DIAGNOSIS — D6832 Hemorrhagic disorder due to extrinsic circulating anticoagulants: Secondary | ICD-10-CM | POA: Diagnosis present

## 2024-06-10 DIAGNOSIS — T45515A Adverse effect of anticoagulants, initial encounter: Secondary | ICD-10-CM | POA: Diagnosis present

## 2024-06-10 DIAGNOSIS — D689 Coagulation defect, unspecified: Secondary | ICD-10-CM | POA: Diagnosis not present

## 2024-06-10 DIAGNOSIS — Z825 Family history of asthma and other chronic lower respiratory diseases: Secondary | ICD-10-CM

## 2024-06-10 DIAGNOSIS — Z91199 Patient's noncompliance with other medical treatment and regimen due to unspecified reason: Secondary | ICD-10-CM

## 2024-06-10 DIAGNOSIS — Z823 Family history of stroke: Secondary | ICD-10-CM

## 2024-06-10 DIAGNOSIS — Z85820 Personal history of malignant melanoma of skin: Secondary | ICD-10-CM

## 2024-06-10 DIAGNOSIS — A411 Sepsis due to other specified staphylococcus: Principal | ICD-10-CM | POA: Diagnosis present

## 2024-06-10 DIAGNOSIS — D869 Sarcoidosis, unspecified: Secondary | ICD-10-CM | POA: Diagnosis present

## 2024-06-10 DIAGNOSIS — I5021 Acute systolic (congestive) heart failure: Secondary | ICD-10-CM | POA: Diagnosis present

## 2024-06-10 DIAGNOSIS — Z885 Allergy status to narcotic agent status: Secondary | ICD-10-CM

## 2024-06-10 DIAGNOSIS — G4700 Insomnia, unspecified: Secondary | ICD-10-CM | POA: Diagnosis present

## 2024-06-10 DIAGNOSIS — Z8249 Family history of ischemic heart disease and other diseases of the circulatory system: Secondary | ICD-10-CM

## 2024-06-10 DIAGNOSIS — Z85850 Personal history of malignant neoplasm of thyroid: Secondary | ICD-10-CM

## 2024-06-10 DIAGNOSIS — Z7983 Long term (current) use of bisphosphonates: Secondary | ICD-10-CM

## 2024-06-10 DIAGNOSIS — E875 Hyperkalemia: Secondary | ICD-10-CM | POA: Diagnosis present

## 2024-06-10 DIAGNOSIS — I311 Chronic constrictive pericarditis: Secondary | ICD-10-CM

## 2024-06-10 DIAGNOSIS — Z91040 Latex allergy status: Secondary | ICD-10-CM

## 2024-06-10 DIAGNOSIS — N19 Unspecified kidney failure: Secondary | ICD-10-CM

## 2024-06-10 DIAGNOSIS — R600 Localized edema: Secondary | ICD-10-CM

## 2024-06-10 DIAGNOSIS — E039 Hypothyroidism, unspecified: Secondary | ICD-10-CM | POA: Diagnosis present

## 2024-06-10 DIAGNOSIS — I5020 Unspecified systolic (congestive) heart failure: Secondary | ICD-10-CM

## 2024-06-10 DIAGNOSIS — B957 Other staphylococcus as the cause of diseases classified elsewhere: Secondary | ICD-10-CM | POA: Diagnosis present

## 2024-06-10 DIAGNOSIS — I110 Hypertensive heart disease with heart failure: Secondary | ICD-10-CM | POA: Diagnosis present

## 2024-06-10 DIAGNOSIS — L97929 Non-pressure chronic ulcer of unspecified part of left lower leg with unspecified severity: Secondary | ICD-10-CM | POA: Diagnosis present

## 2024-06-10 DIAGNOSIS — L97919 Non-pressure chronic ulcer of unspecified part of right lower leg with unspecified severity: Secondary | ICD-10-CM | POA: Diagnosis present

## 2024-06-10 DIAGNOSIS — B952 Enterococcus as the cause of diseases classified elsewhere: Secondary | ICD-10-CM | POA: Diagnosis not present

## 2024-06-10 DIAGNOSIS — R531 Weakness: Principal | ICD-10-CM

## 2024-06-10 DIAGNOSIS — Z888 Allergy status to other drugs, medicaments and biological substances status: Secondary | ICD-10-CM

## 2024-06-10 DIAGNOSIS — Z881 Allergy status to other antibiotic agents status: Secondary | ICD-10-CM

## 2024-06-10 DIAGNOSIS — I4891 Unspecified atrial fibrillation: Secondary | ICD-10-CM | POA: Diagnosis not present

## 2024-06-10 DIAGNOSIS — I3139 Other pericardial effusion (noninflammatory): Secondary | ICD-10-CM | POA: Diagnosis present

## 2024-06-10 DIAGNOSIS — F419 Anxiety disorder, unspecified: Secondary | ICD-10-CM | POA: Diagnosis present

## 2024-06-10 DIAGNOSIS — I959 Hypotension, unspecified: Secondary | ICD-10-CM | POA: Diagnosis not present

## 2024-06-10 DIAGNOSIS — D62 Acute posthemorrhagic anemia: Secondary | ICD-10-CM | POA: Diagnosis present

## 2024-06-10 DIAGNOSIS — Z8701 Personal history of pneumonia (recurrent): Secondary | ICD-10-CM

## 2024-06-10 DIAGNOSIS — Z86711 Personal history of pulmonary embolism: Secondary | ICD-10-CM

## 2024-06-10 DIAGNOSIS — E861 Hypovolemia: Secondary | ICD-10-CM | POA: Diagnosis not present

## 2024-06-10 DIAGNOSIS — Z7901 Long term (current) use of anticoagulants: Secondary | ICD-10-CM

## 2024-06-10 DIAGNOSIS — Z79899 Other long term (current) drug therapy: Secondary | ICD-10-CM

## 2024-06-10 DIAGNOSIS — E66813 Obesity, class 3: Secondary | ICD-10-CM | POA: Diagnosis present

## 2024-06-10 DIAGNOSIS — Z86718 Personal history of other venous thrombosis and embolism: Secondary | ICD-10-CM

## 2024-06-10 DIAGNOSIS — E871 Hypo-osmolality and hyponatremia: Secondary | ICD-10-CM | POA: Diagnosis present

## 2024-06-10 DIAGNOSIS — Z860100 Personal history of colon polyps, unspecified: Secondary | ICD-10-CM

## 2024-06-10 DIAGNOSIS — Z803 Family history of malignant neoplasm of breast: Secondary | ICD-10-CM

## 2024-06-10 DIAGNOSIS — L049 Acute lymphadenitis, unspecified: Secondary | ICD-10-CM | POA: Diagnosis not present

## 2024-06-10 DIAGNOSIS — Z886 Allergy status to analgesic agent status: Secondary | ICD-10-CM

## 2024-06-10 LAB — URINALYSIS, ROUTINE W REFLEX MICROSCOPIC
Bilirubin Urine: NEGATIVE
Glucose, UA: NEGATIVE mg/dL
Hgb urine dipstick: NEGATIVE
Ketones, ur: NEGATIVE mg/dL
Nitrite: NEGATIVE
Protein, ur: 30 mg/dL — AB
Specific Gravity, Urine: 1.026 (ref 1.005–1.030)
pH: 5 (ref 5.0–8.0)

## 2024-06-10 LAB — ECHOCARDIOGRAM COMPLETE
Area-P 1/2: 4.31 cm2
Height: 64 in
S' Lateral: 3.1 cm
Single Plane A4C EF: 50.9 %
Weight: 3855.4 [oz_av]

## 2024-06-10 LAB — BASIC METABOLIC PANEL WITH GFR
Anion gap: 13 (ref 5–15)
Anion gap: 16 — ABNORMAL HIGH (ref 5–15)
BUN: 124 mg/dL — ABNORMAL HIGH (ref 8–23)
BUN: 131 mg/dL — ABNORMAL HIGH (ref 8–23)
CO2: 18 mmol/L — ABNORMAL LOW (ref 22–32)
CO2: 17 mmol/L — ABNORMAL LOW (ref 22–32)
Calcium: 8.3 mg/dL — ABNORMAL LOW (ref 8.9–10.3)
Calcium: 9 mg/dL (ref 8.9–10.3)
Chloride: 95 mmol/L — ABNORMAL LOW (ref 98–111)
Chloride: 93 mmol/L — ABNORMAL LOW (ref 98–111)
Creatinine, Ser: 4.11 mg/dL — ABNORMAL HIGH (ref 0.44–1.00)
Creatinine, Ser: 4.22 mg/dL — ABNORMAL HIGH (ref 0.44–1.00)
GFR, Estimated: 10 mL/min — ABNORMAL LOW
GFR, Estimated: 10 mL/min — ABNORMAL LOW
Glucose, Bld: 118 mg/dL — ABNORMAL HIGH (ref 70–99)
Glucose, Bld: 92 mg/dL (ref 70–99)
Potassium: 5.8 mmol/L — ABNORMAL HIGH (ref 3.5–5.1)
Potassium: 6 mmol/L — ABNORMAL HIGH (ref 3.5–5.1)
Sodium: 126 mmol/L — ABNORMAL LOW (ref 135–145)
Sodium: 126 mmol/L — ABNORMAL LOW (ref 135–145)

## 2024-06-10 LAB — RESPIRATORY PANEL BY PCR

## 2024-06-10 LAB — HEMOGLOBIN A1C
Hgb A1c MFr Bld: 7.6 % — ABNORMAL HIGH (ref 4.8–5.6)
Mean Plasma Glucose: 171.42 mg/dL

## 2024-06-10 LAB — RESP PANEL BY RT-PCR (RSV, FLU A&B, COVID)  RVPGX2
Influenza A by PCR: NEGATIVE
Influenza B by PCR: NEGATIVE
Resp Syncytial Virus by PCR: NEGATIVE
SARS Coronavirus 2 by RT PCR: NEGATIVE

## 2024-06-10 LAB — COMPREHENSIVE METABOLIC PANEL WITH GFR
ALT: 29 U/L (ref 0–44)
AST: 33 U/L (ref 15–41)
Albumin: 3.1 g/dL — ABNORMAL LOW (ref 3.5–5.0)
Alkaline Phosphatase: 160 U/L — ABNORMAL HIGH (ref 38–126)
Anion gap: 15 (ref 5–15)
BUN: 133 mg/dL — ABNORMAL HIGH (ref 8–23)
CO2: 15 mmol/L — ABNORMAL LOW (ref 22–32)
Calcium: 9.1 mg/dL (ref 8.9–10.3)
Chloride: 91 mmol/L — ABNORMAL LOW (ref 98–111)
Creatinine, Ser: 4.67 mg/dL — ABNORMAL HIGH (ref 0.44–1.00)
GFR, Estimated: 9 mL/min — ABNORMAL LOW
Glucose, Bld: 154 mg/dL — ABNORMAL HIGH (ref 70–99)
Potassium: 7 mmol/L (ref 3.5–5.1)
Sodium: 120 mmol/L — ABNORMAL LOW (ref 135–145)
Total Bilirubin: 0.5 mg/dL (ref 0.0–1.2)
Total Protein: 8.2 g/dL — ABNORMAL HIGH (ref 6.5–8.1)

## 2024-06-10 LAB — CBC
HCT: 42.8 % (ref 36.0–46.0)
Hemoglobin: 14.2 g/dL (ref 12.0–15.0)
MCH: 29.3 pg (ref 26.0–34.0)
MCHC: 33.2 g/dL (ref 30.0–36.0)
MCV: 88.4 fL (ref 80.0–100.0)
Platelets: 240 K/uL (ref 150–400)
RBC: 4.84 MIL/uL (ref 3.87–5.11)
RDW: 15.6 % — ABNORMAL HIGH (ref 11.5–15.5)
WBC: 9.6 K/uL (ref 4.0–10.5)
nRBC: 0 % (ref 0.0–0.2)

## 2024-06-10 LAB — GLUCOSE, CAPILLARY
Glucose-Capillary: 103 mg/dL — ABNORMAL HIGH (ref 70–99)
Glucose-Capillary: 86 mg/dL (ref 70–99)
Glucose-Capillary: 133 mg/dL — ABNORMAL HIGH (ref 70–99)

## 2024-06-10 LAB — TROPONIN T, HIGH SENSITIVITY
Troponin T High Sensitivity: 39 ng/L — ABNORMAL HIGH (ref 0–19)
Troponin T High Sensitivity: 33 ng/L — ABNORMAL HIGH (ref 0–19)

## 2024-06-10 LAB — MRSA NEXT GEN BY PCR, NASAL: MRSA by PCR Next Gen: NOT DETECTED

## 2024-06-10 LAB — I-STAT CG4 LACTIC ACID, ED: Lactic Acid, Venous: 1.8 mmol/L (ref 0.5–1.9)

## 2024-06-10 LAB — PRO BRAIN NATRIURETIC PEPTIDE: Pro Brain Natriuretic Peptide: 6454 pg/mL — ABNORMAL HIGH

## 2024-06-10 LAB — CBG MONITORING, ED: Glucose-Capillary: 168 mg/dL — ABNORMAL HIGH (ref 70–99)

## 2024-06-10 LAB — PROTIME-INR
INR: >10 (ref 0.8–1.2)
Prothrombin Time: >90 s — ABNORMAL HIGH (ref 11.4–15.2)

## 2024-06-10 LAB — ABO/RH
ABO/RH(D): A POS
ABO/RH(D): A POS

## 2024-06-10 MED ORDER — LINEZOLID 600 MG/300ML IV SOLN: 600.0000 mg | Freq: Two times a day (BID) | INTRAVENOUS | Status: DC

## 2024-06-10 MED ORDER — INSULIN ASPART 100 UNIT/ML IV SOLN
5.0000 [IU] | Freq: Once | INTRAVENOUS | Status: AC
  Administered 2024-06-10: 5 [IU] via INTRAVENOUS
  Filled 2024-06-10: qty 5

## 2024-06-10 MED ORDER — LACTATED RINGERS IV SOLN: INTRAVENOUS | Status: DC

## 2024-06-10 MED ORDER — SODIUM CHLORIDE 0.9% IV SOLUTION
Freq: Once | INTRAVENOUS | Status: AC
  Administered 2024-06-11: 250 mL via INTRAVENOUS

## 2024-06-10 MED ORDER — STERILE WATER FOR INJECTION IV SOLN
INTRAVENOUS | Status: DC
  Filled 2024-06-10 (×3): qty 1000
  Filled 2024-06-10 (×2): qty 150

## 2024-06-10 MED ORDER — VITAMIN K1 10 MG/ML IJ SOLN
5.0000 mg | Freq: Once | INTRAVENOUS | Status: AC
  Administered 2024-06-10: 5 mg via INTRAVENOUS
  Filled 2024-06-10: qty 0.5

## 2024-06-10 MED ORDER — ONDANSETRON HCL 4 MG/2ML IJ SOLN
4.0000 mg | Freq: Four times a day (QID) | INTRAMUSCULAR | Status: DC | PRN
  Administered 2024-06-15: 4 mg via INTRAVENOUS
  Filled 2024-06-10: qty 2

## 2024-06-10 MED ORDER — VANCOMYCIN HCL IN DEXTROSE 1-5 GM/200ML-% IV SOLN: 1000.0000 mg | Freq: Once | INTRAVENOUS | Status: DC

## 2024-06-10 MED ORDER — SODIUM CHLORIDE 0.9 % IV BOLUS
1000.0000 mL | Freq: Once | INTRAVENOUS | Status: AC
  Administered 2024-06-10: 1000 mL via INTRAVENOUS

## 2024-06-10 MED ORDER — ACETAMINOPHEN 500 MG PO TABS
1000.0000 mg | ORAL_TABLET | Freq: Four times a day (QID) | ORAL | Status: DC | PRN
  Administered 2024-06-14: 1000 mg via ORAL
  Filled 2024-06-10: qty 2

## 2024-06-10 MED ORDER — ORAL CARE MOUTH RINSE: 15.0000 mL | OROMUCOSAL | Status: DC | PRN

## 2024-06-10 MED ORDER — SODIUM ZIRCONIUM CYCLOSILICATE 10 G PO PACK
10.0000 g | PACK | Freq: Once | ORAL | Status: AC
  Administered 2024-06-10: 10 g via ORAL
  Filled 2024-06-10: qty 1

## 2024-06-10 MED ORDER — SODIUM ZIRCONIUM CYCLOSILICATE 10 G PO PACK
10.0000 g | PACK | Freq: Two times a day (BID) | ORAL | Status: DC
  Administered 2024-06-10 – 2024-06-11 (×2): 10 g via ORAL
  Filled 2024-06-10 (×2): qty 1

## 2024-06-10 MED ORDER — SODIUM CHLORIDE 0.9 % IV SOLN
2.0000 g | Freq: Once | INTRAVENOUS | Status: AC
  Administered 2024-06-10: 2 g via INTRAVENOUS
  Filled 2024-06-10: qty 12.5

## 2024-06-10 MED ORDER — INSULIN ASPART 100 UNIT/ML IJ SOLN
0.0000 [IU] | INTRAMUSCULAR | Status: DC
  Administered 2024-06-10 – 2024-06-12 (×2): 2 [IU] via SUBCUTANEOUS
  Administered 2024-06-12: 3 [IU] via SUBCUTANEOUS
  Administered 2024-06-13: 2 [IU] via SUBCUTANEOUS
  Administered 2024-06-13: 3 [IU] via SUBCUTANEOUS
  Administered 2024-06-14: 2 [IU] via SUBCUTANEOUS
  Administered 2024-06-14: 3 [IU] via SUBCUTANEOUS
  Administered 2024-06-15 – 2024-06-16 (×3): 2 [IU] via SUBCUTANEOUS
  Administered 2024-06-16: 3 [IU] via SUBCUTANEOUS
  Administered 2024-06-17 (×5): 2 [IU] via SUBCUTANEOUS
  Filled 2024-06-10 (×3): qty 2
  Filled 2024-06-10 (×3): qty 3
  Filled 2024-06-10 (×8): qty 2
  Filled 2024-06-10: qty 3

## 2024-06-10 MED ORDER — SODIUM BICARBONATE 8.4 % IV SOLN
50.0000 meq | Freq: Once | INTRAVENOUS | Status: AC
  Administered 2024-06-10: 50 meq via INTRAVENOUS
  Filled 2024-06-10: qty 50

## 2024-06-10 MED ORDER — CHLORHEXIDINE GLUCONATE CLOTH 2 % EX PADS
6.0000 | MEDICATED_PAD | Freq: Every day | CUTANEOUS | Status: DC
  Administered 2024-06-10 – 2024-06-13 (×4): 6 via TOPICAL

## 2024-06-10 MED ORDER — DEXTROSE 50 % IV SOLN
1.0000 | Freq: Once | INTRAVENOUS | Status: AC
  Administered 2024-06-10: 50 mL via INTRAVENOUS
  Filled 2024-06-10: qty 50

## 2024-06-10 MED ORDER — ALBUTEROL SULFATE (2.5 MG/3ML) 0.083% IN NEBU
5.0000 mg | INHALATION_SOLUTION | Freq: Once | RESPIRATORY_TRACT | Status: AC
  Administered 2024-06-10: 5 mg via RESPIRATORY_TRACT
  Filled 2024-06-10: qty 6

## 2024-06-10 MED ORDER — HYDROMORPHONE HCL 1 MG/ML IJ SOLN
0.5000 mg | INTRAMUSCULAR | Status: DC | PRN
  Administered 2024-06-10 – 2024-06-11 (×2): 0.5 mg via INTRAVENOUS
  Filled 2024-06-10 (×2): qty 1

## 2024-06-10 MED ORDER — MORPHINE SULFATE (PF) 2 MG/ML IV SOLN
2.0000 mg | Freq: Once | INTRAVENOUS | Status: AC
  Administered 2024-06-10: 2 mg via INTRAVENOUS
  Filled 2024-06-10: qty 1

## 2024-06-10 MED ORDER — DEXTROSE 50 % IV SOLN
25.0000 g | Freq: Once | INTRAVENOUS | Status: AC
  Administered 2024-06-10: 25 g via INTRAVENOUS
  Filled 2024-06-10: qty 50

## 2024-06-10 MED ORDER — CALCIUM GLUCONATE-NACL 1-0.675 GM/50ML-% IV SOLN
1.0000 g | Freq: Once | INTRAVENOUS | Status: AC
  Administered 2024-06-10: 1000 mg via INTRAVENOUS
  Filled 2024-06-10: qty 50

## 2024-06-10 MED ORDER — OXYCODONE HCL 5 MG PO TABS
5.0000 mg | ORAL_TABLET | Freq: Four times a day (QID) | ORAL | Status: DC | PRN
  Administered 2024-06-12 – 2024-06-17 (×7): 5 mg via ORAL
  Filled 2024-06-10 (×7): qty 1

## 2024-06-10 MED ORDER — METRONIDAZOLE 500 MG/100ML IV SOLN
500.0000 mg | Freq: Once | INTRAVENOUS | Status: AC
  Administered 2024-06-10: 500 mg via INTRAVENOUS
  Filled 2024-06-10: qty 100

## 2024-06-10 MED ORDER — VANCOMYCIN HCL 2000 MG/400ML IV SOLN
2000.0000 mg | Freq: Once | INTRAVENOUS | Status: AC
  Administered 2024-06-10: 2000 mg via INTRAVENOUS
  Filled 2024-06-10: qty 400

## 2024-06-10 MED ORDER — SODIUM BICARBONATE 8.4 % IV SOLN
50.0000 meq | INTRAVENOUS | Status: AC
  Administered 2024-06-10: 50 meq via INTRAVENOUS
  Filled 2024-06-10: qty 50

## 2024-06-10 MED ORDER — LINEZOLID 600 MG/300ML IV SOLN
600.0000 mg | Freq: Two times a day (BID) | INTRAVENOUS | Status: DC
  Administered 2024-06-12 – 2024-06-19 (×15): 600 mg via INTRAVENOUS
  Filled 2024-06-10 (×16): qty 300

## 2024-06-10 MED ORDER — CALCIUM GLUCONATE 10 % IV SOLN
1.0000 g | Freq: Once | INTRAVENOUS | Status: AC
  Administered 2024-06-10: 1 g via INTRAVENOUS
  Filled 2024-06-10: qty 10

## 2024-06-10 MED ORDER — SENNA 8.6 MG PO TABS
1.0000 | ORAL_TABLET | Freq: Two times a day (BID) | ORAL | Status: DC | PRN
  Administered 2024-06-17 – 2024-06-18 (×3): 8.6 mg via ORAL
  Filled 2024-06-10 (×3): qty 1

## 2024-06-10 MED ORDER — LEVOTHYROXINE SODIUM 112 MCG PO TABS
112.0000 ug | ORAL_TABLET | Freq: Every day | ORAL | Status: DC
  Administered 2024-06-11 – 2024-06-20 (×10): 112 ug via ORAL
  Filled 2024-06-10 (×10): qty 1

## 2024-06-10 MED ORDER — INSULIN ASPART 100 UNIT/ML IV SOLN
6.0000 [IU] | Freq: Once | INTRAVENOUS | Status: AC
  Administered 2024-06-10: 6 [IU] via INTRAVENOUS
  Filled 2024-06-10: qty 6

## 2024-06-10 MED ORDER — SODIUM CHLORIDE 0.9 % IV SOLN: 2.0000 g | INTRAVENOUS | Status: DC

## 2024-06-10 MED ORDER — POLYETHYLENE GLYCOL 3350 17 G PO PACK
17.0000 g | PACK | Freq: Every day | ORAL | Status: DC | PRN
  Administered 2024-06-18: 17 g via ORAL
  Filled 2024-06-10: qty 1

## 2024-06-10 MED ORDER — SODIUM CHLORIDE 0.9 % IV BOLUS
500.0000 mL | Freq: Once | INTRAVENOUS | Status: AC
  Administered 2024-06-10: 500 mL via INTRAVENOUS

## 2024-06-10 NOTE — ED Notes (Signed)
 Call to lab to confirm BMET is run STAT,  lab confirmed

## 2024-06-10 NOTE — Progress Notes (Signed)
" °   06/10/24 2121  BiPAP/CPAP/SIPAP  BiPAP/CPAP/SIPAP Pt Type Adult  Reason BIPAP/CPAP not in use Non-compliant (Patient wishes to sleep without a CPAP tonight as she states her family member will bring her own CPAP machine tomorrow. RT advised to call if she changes her mind.)  BiPAP/CPAP /SiPAP Vitals  Bilateral Breath Sounds Clear;Diminished    "

## 2024-06-10 NOTE — Consult Note (Addendum)
 WOC Nurse Consult Note: Consult requested for bilat leg wounds.  Performed remotely after review of progress notes and photos in the EMR.  Pt is familiar to [facility] team from recent admission on 12/5.  Bilat lower legs with red, moist scattered areas of full thickness skin loss to lower legs and generalized edema and erythemia.   Dressing procedure/placement/frequency: Topical treatment orders provided for bedside nurses to perform as follows to absorb drainage and provide antimicrobial benefits: Apply Aquacel Soila # [id number]) to open wounds Q M/W/F, then cover with abd pads and kerlex, beginning just behind toes to below knees, then ace wrap in the same fashion. Moisten previous dressing with NS to assist with removal each time.   Please re-consult if further assistance is needed.  Thank-you,  Stephane Fought MSN, RN, CWOCN, CWCN-AP, CNS Contact Mon-Fri 0700-1500: [phone]

## 2024-06-10 NOTE — Progress Notes (Signed)
" °  Echocardiogram 2D Echocardiogram has been performed.  Devora Ellouise SAUNDERS 06/10/2024, 1:55 PM "

## 2024-06-10 NOTE — ED Provider Notes (Addendum)
 " [facility] Provider Note   CSN: [id number] Arrival date & time: 06/10/24  [id number]     Patient presents with: Weakness (BIBA from home w/ c/o generalized weakness x2 weeks.  States patient seen at [facility] 2 weeks ago and told some issue with lymph nodes, had biopsy, no results or unsure of results.  Was seeing wound care for BLE lymphedema, but no longer receiving.  Per EMS, hypotensive en route, 76/30, Afib HR 40-60, 92% RA and RR 30. 20g placed to Left AC and received approx 300 ml LR. )   [name] is a 84 y.o. female.   Pt with c/o general weakness in past two weeks. States bil legs with open sores, and chronically painful which makes rest/sleep difficult. States is being evaluated for lymphadenopathy, and initial testing indicated was not a malignant process. EMS found bp to be low, and hr low,  and gave ivf bolus, bp currently normal. Pt notes generally poor appetite/poor po intake. Denies change in meds or new meds. No fever or chills. Denies headache. No chest pain or discomfort. No cough or sore throat. No abd pain or vomiting or diarrhea. No dysuria. No new extremity pain/swelling. Patient has hx chronic/permanent afib, is on coumadin . Denies recent blood loss, vaginal bleeding, rectal bleeding/melena, or abnormal bruising.   The history is provided by the patient, medical records and the EMS personnel.  Weakness Associated symptoms: no abdominal pain, no chest pain, no cough, no diarrhea, no dysuria, no fever, no headaches, no shortness of breath and no vomiting        Prior to Admission medications  Medication Sig Start Date End Date Taking? Authorizing Provider  acetaminophen  (TYLENOL ) 325 MG tablet Take 325-650 mg by mouth 2 (two) times daily as needed for mild pain or headache.    [provider]  Carboxymethylcellulose Sodium (REFRESH LIQUIGEL OP) Place 1 drop into both eyes at bedtime.    [provider]   diltiazem  (TIAZAC ) 360 MG 24 hr capsule Take 360 mg by mouth at bedtime.    [provider]  furosemide  (LASIX ) 40 MG tablet Take 40 mg by mouth daily.    [provider]  irbesartan  (AVAPRO ) 75 MG tablet Take 1 tablet (75 mg total) by mouth daily. 05/16/24   Gonfa, Taye T, MD  levothyroxine  (SYNTHROID ) 112 MCG tablet Take 112 mcg by mouth every morning.    [provider]  LORazepam  (ATIVAN ) 1 MG tablet Take 1.5 mg by mouth See admin instructions. Take 1.5 mg by mouth at bedtime and an additional 1-1.5mg  once a day as needed for anxiety    [provider]  nebivolol  (BYSTOLIC ) 5 MG tablet Take 1 tablet (5 mg total) by mouth daily. Patient taking differently: Take 5 mg by mouth daily at 12 noon. 09/27/22   Sheikh, Omair Latif, DO  senna-docusate (SENOKOT-S) 8.6-50 MG tablet Take 1-2 tablets by mouth 2 (two) times daily between meals as needed for mild constipation or moderate constipation. 05/16/24   Gonfa, Taye T, MD  SIMPLY SALINE NA Place 1 spray into both nostrils as needed (for dryness).    [provider]  triamcinolone cream (KENALOG) 0.1 % Apply 1 Application topically See admin instructions. Apply to both legs 2 times a day 04/05/21   [provider]  warfarin (COUMADIN ) 5 MG tablet Take 5 mg by mouth at bedtime. 04/25/15   [provider]    Allergies: Atenolol, Citalopram hydrobromide, Clindamycin /lincomycin,  Codeine, Coenzyme q10, Erythromycin, Motrin [ibuprofen], Statins, Latex, and Losartan potassium    Review of Systems  Constitutional:  Negative for chills and fever.  HENT:  Negative for sore throat.   Eyes:  Negative for redness.  Respiratory:  Negative for cough and shortness of breath.   Cardiovascular:  Negative for chest pain.  Gastrointestinal:  Negative for abdominal pain, diarrhea and vomiting.  Genitourinary:  Negative for dysuria and flank pain.  Musculoskeletal:  Negative for back pain and neck pain.   Skin:  Negative for rash.  Neurological:  Positive for weakness. Negative for headaches.    Updated Vital Signs BP 91/65 (BP Location: Right Arm)   Pulse 62   Temp (!) 97.4 F (36.3 C) (Oral)   Resp (!) 23   Ht 1.626 m (5' 4)   Wt 109.3 kg   SpO2 93%   BMI 41.36 kg/m   Physical Exam Vitals and nursing note reviewed.  Constitutional:      Appearance: Normal appearance. She is well-developed.  HENT:     Head: Atraumatic.     Nose: Nose normal.     Mouth/Throat:     Mouth: Mucous membranes are moist.  Eyes:     General: No scleral icterus.    Conjunctiva/sclera: Conjunctivae normal.     Pupils: Pupils are equal, round, and reactive to light.  Neck:     Trachea: No tracheal deviation.     Comments: Trachea midline. Thyroid  not grossly enlarged or tender. No neck stiffness or rigidity.  Cardiovascular:     Rate and Rhythm: Normal rate and regular rhythm.     Pulses: Normal pulses.     Heart sounds: Normal heart sounds. No murmur heard.    No friction rub. No gallop.  Pulmonary:     Effort: Pulmonary effort is normal. No respiratory distress.     Breath sounds: Normal breath sounds.  Abdominal:     General: Bowel sounds are normal. There is no distension.     Palpations: Abdomen is soft.     Tenderness: There is no abdominal tenderness. There is no guarding.     Comments: Soft, not tender, not painful, easily reducible umbilical hernia.   Genitourinary:    Comments: No cva tenderness.  Musculoskeletal:     Cervical back: Normal range of motion and neck supple. No rigidity. No muscular tenderness.     Comments: Chronic symmetric leg swelling/lymphedema. Pt with multiple sores/ulcerations to bilateral lower legs.   Skin:    General: Skin is warm and dry.     Findings: No rash.  Neurological:     Mental Status: She is alert.     Comments: Alert, speech normal. Motor/sens grossly intact bil.   Psychiatric:        Mood and Affect: Mood normal.     (all labs  ordered are listed, but only abnormal results are displayed) Results for orders placed or performed during the hospital encounter of 06/10/24  CBG monitoring, ED   Collection Time: 06/10/24  6:54 AM  Result Value Ref Range   Glucose-Capillary 168 (H) 70 - 99 mg/dL  CBC   Collection Time: 06/10/24  8:24 AM  Result Value Ref Range   WBC 9.6 4.0 - 10.5 K/uL   RBC 4.84 3.87 - 5.11 MIL/uL   Hemoglobin 14.2 12.0 - 15.0 g/dL   HCT 57.1 63.9 - 53.9 %   MCV 88.4 80.0 - 100.0 fL   MCH 29.3 26.0 - 34.0 pg   MCHC  33.2 30.0 - 36.0 g/dL   RDW 84.3 (H) 88.4 - 84.4 %   Platelets 240 150 - 400 K/uL   nRBC 0.0 0.0 - 0.2 %  Comprehensive metabolic panel with GFR   Collection Time: 06/10/24  8:24 AM  Result Value Ref Range   Sodium 120 (L) 135 - 145 mmol/L   Potassium 7.0 (HH) 3.5 - 5.1 mmol/L   Chloride 91 (L) 98 - 111 mmol/L   CO2 15 (L) 22 - 32 mmol/L   Glucose, Bld 154 (H) 70 - 99 mg/dL   BUN 866 (H) 8 - 23 mg/dL   Creatinine, Ser 5.32 (H) 0.44 - 1.00 mg/dL   Calcium  9.1 8.9 - 10.3 mg/dL   Total Protein 8.2 (H) 6.5 - 8.1 g/dL   Albumin 3.1 (L) 3.5 - 5.0 g/dL   AST 33 15 - 41 U/L   ALT 29 0 - 44 U/L   Alkaline Phosphatase 160 (H) 38 - 126 U/L   Total Bilirubin 0.5 0.0 - 1.2 mg/dL   GFR, Estimated 9 (L) >60 mL/min   Anion gap 15 5 - 15  Pro Brain natriuretic peptide   Collection Time: 06/10/24  8:24 AM  Result Value Ref Range   Pro Brain Natriuretic Peptide 6,454.0 (H) <300.0 pg/mL  Protime-INR   Collection Time: 06/10/24  8:24 AM  Result Value Ref Range   Prothrombin Time >90.0 (H) 11.4 - 15.2 seconds   INR >10.0 (HH) 0.8 - 1.2  Troponin T, High Sensitivity   Collection Time: 06/10/24  8:24 AM  Result Value Ref Range   Troponin T High Sensitivity 39 (H) 0 - 19 ng/L  I-Stat CG4 Lactic Acid   Collection Time: 06/10/24  8:25 AM  Result Value Ref Range   Lactic Acid, Venous 1.8 0.5 - 1.9 mmol/L  Troponin T, High Sensitivity   Collection Time: 06/10/24  9:59 AM  Result Value Ref  Range   Troponin T High Sensitivity 33 (H) 0 - 19 ng/L   CT CHEST ABDOMEN PELVIS WO CONTRAST Result Date: 06/10/2024 CLINICAL DATA:  Generalized weakness for 2 weeks.  Sepsis. EXAM: CT CHEST, ABDOMEN AND PELVIS WITHOUT CONTRAST TECHNIQUE: Multidetector CT imaging of the chest, abdomen and pelvis was performed following the standard protocol without IV contrast. RADIATION DOSE REDUCTION: This exam was performed according to the departmental dose-optimization program which includes automated exposure control, adjustment of the mA and/or kV according to patient size and/or use of iterative reconstruction technique. COMPARISON:  May 12, 2024 FINDINGS: CT CHEST FINDINGS Cardiovascular: Aortic atherosclerosis. No evidence of thoracic aortic aneurysm. Mild cardiomegaly. Coronary artery calcifications are noted. Moderate pericardial effusion or hemopericardium is now noted. Mediastinum/Nodes: Stable left thyroid  nodule which has been previously assessed on ultrasound. Esophagus is unremarkable. Calcified mediastinal lymph nodes are noted suggesting prior granulomatous disease. Lungs/Pleura: No pneumothorax or pleural effusion is noted. Left lingular and left lower lobe airspace opacities are noted with bronchograms concerning for possible pneumonia. Musculoskeletal: No chest wall mass or suspicious bone lesions identified. CT ABDOMEN PELVIS FINDINGS Hepatobiliary: No focal liver abnormality is seen. Status post cholecystectomy. No biliary dilatation. Pancreas: Unremarkable. No pancreatic ductal dilatation or surrounding inflammatory changes. Spleen: Normal in size without focal abnormality. Adrenals/Urinary Tract: Adrenal glands are unremarkable. Kidneys are normal, without renal calculi, focal lesion, or hydronephrosis. Bladder is unremarkable. Stomach/Bowel: Stomach is unremarkable. Status post appendectomy. Sigmoid diverticulosis without inflammation. No evidence of bowel obstruction. Vascular/Lymphatic: Aortic  atherosclerosis. Bilateral inguinal adenopathy is noted. Left inguinal lymph node had been  recently biopsied and correlation with pathology results is recommended. Reproductive: Status post hysterectomy. No adnexal masses. Other: Moderate size fat containing periumbilical hernia. No ascites. Musculoskeletal: No acute or significant osseous findings. IMPRESSION: 1. Moderate pericardial effusion or hemopericardium is now noted. 2. Left lingular and left lower lobe airspace opacities are noted with bronchograms concerning for pneumonia. 3. Bilateral inguinal adenopathy is noted. Left inguinal lymph node had been recently biopsied and correlation with pathology results is recommended. 4. Sigmoid diverticulosis without inflammation. 5. Moderate size fat containing periumbilical hernia. 6. Aortic atherosclerosis. Aortic Atherosclerosis (ICD10-I70.0). Electronically Signed   By: Lynwood Landy Raddle M.D.   On: 06/10/2024 11:54   DG Chest Port 1 View Result Date: 06/10/2024 EXAM: 1 VIEW(S) XRAY OF THE CHEST 06/10/2024 07:50:00 AM COMPARISON: 05/12/2024 CLINICAL HISTORY: weakness FINDINGS: LUNGS AND PLEURA: Calcified granuloma of right costophrenic angle. Small to moderate left sided pleural effusion. Increasing left basilar opacities which may reflect atelectasis versus infection. Prominent central pulmonary vasculature without pulmonary edema. No pneumothorax. HEART AND MEDIASTINUM: Cardiomegaly. Atherosclerotic plaque. BONES AND SOFT TISSUES: No acute osseous abnormality. IMPRESSION: 1. Small to moderate left pleural effusion with increasing left basilar opacities, which may reflect atelectasis or infection. 2. Cardiomegaly. Electronically signed by: Donnice Mania MD 06/10/2024 08:56 AM EST RP Workstation: [id number]   US  CORE BIOPSY (LYMPH NODES) Result Date: 05/16/2024 INDICATION: 84 year old female with nonspecific mild lymphadenopathy. CLL or other indolent process suspected. She presents for biopsy. EXAM: ULTRASOUND  BIOPSY OF THE LYMPH NODES MEDICATIONS: None. ANESTHESIA/SEDATION: Moderate (conscious) sedation was employed during this procedure. A total of Versed  1 mg and Fentanyl  50 mcg was administered intravenously by the Radiology nurse. Moderate Sedation Time: 11 minutes. The patient's level of consciousness and vital signs were monitored continuously by radiology nursing throughout the procedure under my direct supervision. FLUOROSCOPY TIME:  None. COMPLICATIONS: None immediate. PROCEDURE: Informed written consent was obtained from the patient after a thorough discussion of the procedural risks, benefits and alternatives. All questions were addressed. Maximal Sterile Barrier Technique was utilized including caps, mask, sterile gowns, sterile gloves, sterile drape, hand hygiene and skin antiseptic. A timeout was performed prior to the initiation of the procedure. The left inguinal region was interrogated with ultrasound. One of the enlarged lymph nodes was identified. A suitable skin entry site was selected and marked. The region was sterilely prepped and draped in the standard fashion using chlorhexidine  skin prep. Local anesthesia was attained by infiltration with 1% lidocaine . A small dermatotomy was made. Under real-time ultrasound guidance, multiple 18 gauge core biopsies were obtained using the [employer] mission automated biopsy device. Biopsy specimens were placed in saline for further evaluation. IMPRESSION: Ultrasound-guided core biopsy of left inguinal lymphadenopathy. Electronically Signed   By: Wilkie Lent M.D.   On: 05/16/2024 10:14   US  THYROID  Result Date: 05/14/2024 CLINICAL DATA:  Incidental on CT. [id number] Nodule of left lobe of thyroid  gland [id number] Reported history of RIGHT hemithyroidectomy, and prior biopsy of a dominant then 2.7 cm LEFT thyroid  nodule (06/12/2010). EXAM: THYROID  ULTRASOUND TECHNIQUE: Ultrasound examination of the thyroid  gland and adjacent soft tissues was performed. COMPARISON:   CT CAP, 05/12/2024. US  Thyroid , 01/31/2016 and 05/29/2010. FINDINGS: Parenchymal Echotexture: Mildly heterogenous Isthmus: 0.3 cm Right lobe: Surgically absent Left lobe: 3.7 x 2.4 x 2.1 cm _________________________________________________________ Estimated total number of nodules >/= 1 cm: 1 Number of spongiform nodules >/=  2 cm not described below (TR1): 0 Number of mixed cystic and solid nodules >/= 1.5 cm not described below (TR2): 0  _________________________________________________________ Nodule # 1: Location: LEFT; Mid Maximum size: 2.3 cm; Other 2 dimensions: 1.6 x 1.7 cm Composition: solid/almost completely solid (2) Echogenicity: hypoechoic (2) Shape: not taller-than-wide (0) Margins: ill-defined (0) Echogenic foci: punctate echogenic foci (3) This nodule appears morphologically stable for least > 10 years, and was previously biopsied in 06/12/2010. Assuming a benign pathologic diagnosis, repeat sampling and/or dedicated follow-up is not recommended. _________________________________________________________ No residual or recurrent tissue within the RIGHT thyroidectomy bed. additional sub-1 cm LEFT inferior pole cyst does not meet threshold for follow-up nor biopsy per current criteria. No cervical adenopathy or abnormal fluid collection within the imaged neck. IMPRESSION: 1. No residual recurrent tissue within the RIGHT thyroidectomy bed. 2. 2.3 cm LEFT mid thyroid  nodule correlates with lesion seen on comparison CT. This nodule has been stable for > 10 years, and was previously biopsied in 06/12/2010. Assuming a benign pathologic diagnosis, repeat sampling and/or dedicated follow-up is not recommended. The above is in keeping with the ACR TI-RADS recommendations - J Am Coll Radiol 2017;14:587-595. Electronically Signed   By: Thom Hall M.D.   On: 05/14/2024 07:19   ECHOCARDIOGRAM COMPLETE Result Date: 05/13/2024    ECHOCARDIOGRAM REPORT   Patient Name:   [name] Date of Exam: 05/13/2024  Medical Rec #:  [id number]            Height:       64.0 in Accession #:    [id number]           Weight:       239.8 lb Date of Birth:  11-Sep-1940            BSA:          2.113 m Patient Age:    83 years             BP:           110/52 mmHg Patient Gender: F                    HR:           82 bpm. Exam Location:  Inpatient Procedure: 2D Echo (Both Spectral and Color Flow Doppler were utilized during            procedure). Indications:    Afib  History:        Patient has prior history of Echocardiogram examinations.                 Arrythmias:Atrial Fibrillation.  Sonographer:    Norleen Amour Referring Phys: [id number] KELLY A GRIFFITH IMPRESSIONS  1. Left ventricular ejection fraction, by estimation, is 55 to 60%. The left ventricle has normal function. The left ventricle has no regional wall motion abnormalities. There is mild left ventricular hypertrophy. Left ventricular diastolic parameters are indeterminate.  2. Right ventricular systolic function is normal. The right ventricular size is normal. Tricuspid regurgitation signal is inadequate for assessing PA pressure.  3. Left atrial size was moderately dilated.  4. Right atrial size was moderately dilated.  5. The mitral valve is normal in structure. Trivial mitral valve regurgitation. No evidence of mitral stenosis.  6. The aortic valve is tricuspid. Aortic valve regurgitation is not visualized. Aortic valve sclerosis/calcification is present, without any evidence of aortic stenosis.  7. The inferior vena cava is normal in size with greater than 50% respiratory variability, suggesting right atrial pressure of 3 mmHg. FINDINGS  Left Ventricle: Left ventricular ejection fraction, by estimation, is 55 to 60%.  The left ventricle has normal function. The left ventricle has no regional wall motion abnormalities. The left ventricular internal cavity size was normal in size. There is  mild left ventricular hypertrophy. Left ventricular diastolic function could not be  evaluated due to atrial fibrillation. Left ventricular diastolic parameters are indeterminate. Right Ventricle: The right ventricular size is normal. Right ventricular systolic function is normal. Tricuspid regurgitation signal is inadequate for assessing PA pressure. The tricuspid regurgitant velocity is 2.02 m/s, and with an assumed right atrial  pressure of 3 mmHg, the estimated right ventricular systolic pressure is 19.3 mmHg. Left Atrium: Left atrial size was moderately dilated. Right Atrium: Right atrial size was moderately dilated. Pericardium: There is no evidence of pericardial effusion. Mitral Valve: The mitral valve is normal in structure. Trivial mitral valve regurgitation. No evidence of mitral valve stenosis. MV peak gradient, 5.8 mmHg. The mean mitral valve gradient is 2.0 mmHg. Tricuspid Valve: The tricuspid valve is normal in structure. Tricuspid valve regurgitation is trivial. No evidence of tricuspid stenosis. Aortic Valve: The aortic valve is tricuspid. Aortic valve regurgitation is not visualized. Aortic valve sclerosis/calcification is present, without any evidence of aortic stenosis. Aortic valve mean gradient measures 5.0 mmHg. Aortic valve peak gradient measures 7.6 mmHg. Pulmonic Valve: The pulmonic valve was not well visualized. Pulmonic valve regurgitation is not visualized. No evidence of pulmonic stenosis. Aorta: The aortic root is normal in size and structure. Venous: The inferior vena cava is normal in size with greater than 50% respiratory variability, suggesting right atrial pressure of 3 mmHg. IAS/Shunts: No atrial level shunt detected by color flow Doppler.  LEFT VENTRICLE PLAX 2D LVIDd:         5.00 cm     Diastology LVIDs:         3.20 cm     LV e' medial:    8.63 cm/s LV PW:         1.30 cm     LV E/e' medial:  13.2 LV IVS:        0.90 cm     LV e' lateral:   10.10 cm/s LVOT diam:     1.90 cm     LV E/e' lateral: 11.3 LV SV:         32 LV SV Index:   15 LVOT Area:     2.84 cm   LV Volumes (MOD) LV vol d, MOD A2C: 93.4 ml LV vol d, MOD A4C: 97.6 ml LV vol s, MOD A2C: 39.8 ml LV vol s, MOD A4C: 40.2 ml LV SV MOD A2C:     53.6 ml LV SV MOD A4C:     97.6 ml LV SV MOD BP:      58.5 ml RIGHT VENTRICLE             IVC RV Basal diam:  4.00 cm     IVC diam: 1.90 cm RV S prime:     10.10 cm/s TAPSE (M-mode): 1.7 cm      PULMONARY VEINS                             Diastolic Velocity: 63.30 cm/s                             S/D Velocity:       0.40  Systolic Velocity:  26.80 cm/s LEFT ATRIUM             Index        RIGHT ATRIUM           Index LA diam:        3.90 cm 1.85 cm/m   RA Area:     26.80 cm LA Vol (A2C):   78.5 ml 37.15 ml/m  RA Volume:   89.20 ml  42.21 ml/m LA Vol (A4C):   67.1 ml 31.75 ml/m LA Biplane Vol: 76.4 ml 36.15 ml/m  AORTIC VALVE                     PULMONIC VALVE AV Area (Vmax):    1.30 cm      PV Vmax:       0.68 m/s AV Area (Vmean):   1.17 cm      PV Peak grad:  1.9 mmHg AV Area (VTI):     1.42 cm AV Vmax:           138.00 cm/s AV Vmean:          103.000 cm/s AV VTI:            0.227 m AV Peak Grad:      7.6 mmHg AV Mean Grad:      5.0 mmHg LVOT Vmax:         63.10 cm/s LVOT Vmean:        42.400 cm/s LVOT VTI:          0.114 m LVOT/AV VTI ratio: 0.50  AORTA Ao Root diam: 2.90 cm Ao Asc diam:  3.00 cm MITRAL VALVE                TRICUSPID VALVE MV Area (PHT): 4.82 cm     TR Peak grad:   16.3 mmHg MV Area VTI:   1.08 cm     TR Vmax:        202.00 cm/s MV Peak grad:  5.8 mmHg MV Mean grad:  2.0 mmHg     SHUNTS MV Vmax:       1.20 m/s     Systemic VTI:  0.11 m MV Vmean:      57.7 cm/s    Systemic Diam: 1.90 cm MV Decel Time: 157 msec MV E velocity: 113.67 cm/s MV A velocity: 45.60 cm/s MV E/A ratio:  2.49 Redell Shallow MD Electronically signed by Redell Shallow MD Signature Date/Time: 05/13/2024/10:35:19 AM    Final    CT Angio Chest/Abd/Pel for Dissection W and/or Wo Contrast Result Date: 05/12/2024 CLINICAL DATA:  Acute aortic syndrome  suspected. EXAM: CT ANGIOGRAPHY CHEST, ABDOMEN AND PELVIS TECHNIQUE: Non-contrast CT of the chest was initially obtained. Multidetector CT imaging through the chest, abdomen and pelvis was performed using the standard protocol during bolus administration of intravenous contrast. Multiplanar reconstructed images and MIPs were obtained and reviewed to evaluate the vascular anatomy. RADIATION DOSE REDUCTION: This exam was performed according to the departmental dose-optimization program which includes automated exposure control, adjustment of the mA and/or kV according to patient size and/or use of iterative reconstruction technique. CONTRAST:  OMNIPAQUE  IOHEXOL  350 MG/ML SOLN COMPARISON:  CT abdomen and pelvis 04/12/2018 FINDINGS: CTA CHEST FINDINGS Cardiovascular: Heart is enlarged. There is satisfactory opacification of the thoracic aorta. The aorta is normal in size. There is no evidence for dissection or aneurysm. There are mild calcified atherosclerotic plaque throughout the aorta and coronary arteries. There  is no pericardial effusion. Mediastinum/Nodes: There are enlarged mediastinal and hilar lymph nodes. There is a 16 mm hypodense nodule in the left thyroid . Visualized esophagus is within normal limits. Lungs/Pleura: There are atelectatic changes in the lung bases and lingula. There is no new focal lung infiltrate, pleural effusion or pneumothorax. There are calcified granulomas in the right lung. Musculoskeletal: Degenerative changes affect the spine. Review of the MIP images confirms the above findings. CTA ABDOMEN AND PELVIS FINDINGS VASCULAR Aorta: Normal caliber aorta without aneurysm, dissection, vasculitis or significant stenosis. There are atherosclerotic calcifications of the aorta. Celiac: Patent without evidence of aneurysm, dissection, vasculitis or significant stenosis. SMA: Patent without evidence of aneurysm, dissection, vasculitis or significant stenosis. Renals: Both renal arteries are  patent without evidence of aneurysm, dissection, vasculitis, fibromuscular dysplasia or significant stenosis. IMA: Patent without evidence of aneurysm, dissection, vasculitis or significant stenosis. Inflow: Patent without evidence of aneurysm, dissection, vasculitis or significant stenosis. Veins: No obvious venous abnormality within the limitations of this arterial phase study. Review of the MIP images confirms the above findings. NON-VASCULAR Hepatobiliary: No focal liver abnormality is seen. Status post cholecystectomy. No biliary dilatation. There is a calcified granuloma in the left lobe. Pancreas: Unremarkable. No pancreatic ductal dilatation or surrounding inflammatory changes. Spleen: Normal in size without focal abnormality. Adrenals/Urinary Tract: Adrenal glands are unremarkable. Kidneys are normal, without renal calculi, focal lesion, or hydronephrosis. Bladder is unremarkable. Stomach/Bowel: Stomach is within normal limits. No evidence of bowel wall thickening, distention, or inflammatory changes. There is sigmoid colon diverticulosis. The appendix is not visualized. Lymphatic: There are mildly enlarged and nonenlarged left retroperitoneal lymph nodes measuring up to 1 cm. There are prominent and mildly enlarged iliac chain lymph nodes measuring up to 1 cm. There are enlarged bilateral inguinal lymph nodes measuring up to 18 mm short axis. Reproductive: Bilateral ovaries are within normal limits. Uterus is surgically absent. Other: There is no ascites or free air. There is a moderate-sized fat containing umbilical hernia. There is lower anterior abdominal wall subcutaneous stranding and skin thickening. Musculoskeletal: No acute or significant osseous findings. Review of the MIP images confirms the above findings. IMPRESSION: 1. No evidence for aortic dissection or aneurysm. 2. Cardiomegaly. 3. Mediastinal, hilar, retroperitoneal and inguinal lymphadenopathy. Findings are worrisome for metastatic  disease or lymphoma. 4. Lower anterior abdominal wall subcutaneous stranding and skin thickening. Correlate clinically for cellulitis. 5. Colonic diverticulosis. 6. Moderate-sized fat containing umbilical hernia. 7. 16 mm left thyroid  nodule, indeterminate. Recommend thyroid  US  (ref: J Am Coll Radiol. 2015 Feb;12(2): 143-50). 8. Aortic atherosclerosis. Aortic Atherosclerosis (ICD10-I70.0). Electronically Signed   By: Greig Pique M.D.   On: 05/12/2024 18:50   DG Chest 2 View Result Date: 05/12/2024 CLINICAL DATA:  chest pain EXAM: CHEST - 2 VIEW COMPARISON:  August 13, 2023 FINDINGS: No focal airspace consolidation, pleural effusion, or pneumothorax. No cardiomegaly. Calcified hilar lymph nodes. Tortuous aorta with aortic atherosclerosis. No acute fracture or destructive lesions. Multilevel thoracic osteophytosis. IMPRESSION: No acute cardiopulmonary abnormality. Electronically Signed   By: Rogelia Myers M.D.   On: 05/12/2024 16:49     EKG: EKG Interpretation Date/Time:  Friday June 10 2024 06:45:56 EST Ventricular Rate:  55 PR Interval:    QRS Duration:  108 QT Interval:  436 QTC Calculation: 417 R Axis:   80  Text Interpretation: Atrial fibrillation with with slow ventricular response Low voltage, extremity and precordial leads Non-specific ST-t changes Confirmed by Bernard Drivers ([id number]) on 06/10/2024 7:34:50 AM  Radiology: CT CHEST ABDOMEN  PELVIS WO CONTRAST Result Date: 06/10/2024 CLINICAL DATA:  Generalized weakness for 2 weeks.  Sepsis. EXAM: CT CHEST, ABDOMEN AND PELVIS WITHOUT CONTRAST TECHNIQUE: Multidetector CT imaging of the chest, abdomen and pelvis was performed following the standard protocol without IV contrast. RADIATION DOSE REDUCTION: This exam was performed according to the departmental dose-optimization program which includes automated exposure control, adjustment of the mA and/or kV according to patient size and/or use of iterative reconstruction technique. COMPARISON:   May 12, 2024 FINDINGS: CT CHEST FINDINGS Cardiovascular: Aortic atherosclerosis. No evidence of thoracic aortic aneurysm. Mild cardiomegaly. Coronary artery calcifications are noted. Moderate pericardial effusion or hemopericardium is now noted. Mediastinum/Nodes: Stable left thyroid  nodule which has been previously assessed on ultrasound. Esophagus is unremarkable. Calcified mediastinal lymph nodes are noted suggesting prior granulomatous disease. Lungs/Pleura: No pneumothorax or pleural effusion is noted. Left lingular and left lower lobe airspace opacities are noted with bronchograms concerning for possible pneumonia. Musculoskeletal: No chest wall mass or suspicious bone lesions identified. CT ABDOMEN PELVIS FINDINGS Hepatobiliary: No focal liver abnormality is seen. Status post cholecystectomy. No biliary dilatation. Pancreas: Unremarkable. No pancreatic ductal dilatation or surrounding inflammatory changes. Spleen: Normal in size without focal abnormality. Adrenals/Urinary Tract: Adrenal glands are unremarkable. Kidneys are normal, without renal calculi, focal lesion, or hydronephrosis. Bladder is unremarkable. Stomach/Bowel: Stomach is unremarkable. Status post appendectomy. Sigmoid diverticulosis without inflammation. No evidence of bowel obstruction. Vascular/Lymphatic: Aortic atherosclerosis. Bilateral inguinal adenopathy is noted. Left inguinal lymph node had been recently biopsied and correlation with pathology results is recommended. Reproductive: Status post hysterectomy. No adnexal masses. Other: Moderate size fat containing periumbilical hernia. No ascites. Musculoskeletal: No acute or significant osseous findings. IMPRESSION: 1. Moderate pericardial effusion or hemopericardium is now noted. 2. Left lingular and left lower lobe airspace opacities are noted with bronchograms concerning for pneumonia. 3. Bilateral inguinal adenopathy is noted. Left inguinal lymph node had been recently biopsied and  correlation with pathology results is recommended. 4. Sigmoid diverticulosis without inflammation. 5. Moderate size fat containing periumbilical hernia. 6. Aortic atherosclerosis. Aortic Atherosclerosis (ICD10-I70.0). Electronically Signed   By: Lynwood Landy Raddle M.D.   On: 06/10/2024 11:54   DG Chest Port 1 View Result Date: 06/10/2024 EXAM: 1 VIEW(S) XRAY OF THE CHEST 06/10/2024 07:50:00 AM COMPARISON: 05/12/2024 CLINICAL HISTORY: weakness FINDINGS: LUNGS AND PLEURA: Calcified granuloma of right costophrenic angle. Small to moderate left sided pleural effusion. Increasing left basilar opacities which may reflect atelectasis versus infection. Prominent central pulmonary vasculature without pulmonary edema. No pneumothorax. HEART AND MEDIASTINUM: Cardiomegaly. Atherosclerotic plaque. BONES AND SOFT TISSUES: No acute osseous abnormality. IMPRESSION: 1. Small to moderate left pleural effusion with increasing left basilar opacities, which may reflect atelectasis or infection. 2. Cardiomegaly. Electronically signed by: Donnice Mania MD 06/10/2024 08:56 AM EST RP Workstation: [id number]     Procedures   Medications Ordered in the ED  lactated ringers  infusion ( Intravenous New Bag/Given 06/10/24 1220)  ceFEPIme  (MAXIPIME ) 2 g in sodium chloride  0.9 % 100 mL IVPB (2 g Intravenous New Bag/Given 06/10/24 1221)  metroNIDAZOLE (FLAGYL) IVPB 500 mg (has no administration in time range)  vancomycin  (VANCOREADY) IVPB 2000 mg/400 mL (has no administration in time range)  sodium chloride  0.9 % bolus 1,000 mL (1,000 mLs Intravenous New Bag/Given 06/10/24 0951)  calcium  gluconate inj 10% (1 g) URGENT USE ONLY! (1 g Intravenous Given 06/10/24 0927)  sodium bicarbonate injection 50 mEq (50 mEq Intravenous Given 06/10/24 0940)  dextrose  50 % solution 25 g (25 g Intravenous Given 06/10/24 0941)  insulin  aspart (novoLOG) injection 6 Units (6 Units Intravenous Given 06/10/24 1100)  albuterol  (PROVENTIL ) (2.5 MG/3ML) 0.083% nebulizer  solution 5 mg (5 mg Nebulization Given 06/10/24 0948)  sodium zirconium cyclosilicate (LOKELMA) packet 10 g (10 g Oral Given 06/10/24 0952)  morphine (PF) 2 MG/ML injection 2 mg (2 mg Intravenous Given 06/10/24 1100)                                    Medical Decision Making Problems Addressed: AKI (acute kidney injury): acute illness or injury with systemic symptoms that poses a threat to life or bodily functions Atrial fibrillation with slow ventricular response (HCC): chronic illness or injury with exacerbation, progression, or side effects of treatment that poses a threat to life or bodily functions Dehydration: acute illness or injury that poses a threat to life or bodily functions Elevated brain natriuretic peptide (BNP) level: acute illness or injury Generalized weakness: acute illness or injury with systemic symptoms that poses a threat to life or bodily functions Hyperkalemia: acute illness or injury with systemic symptoms that poses a threat to life or bodily functions Hypotension due to hypovolemia: acute illness or injury with systemic symptoms that poses a threat to life or bodily functions Pericardial effusion: acute illness or injury with systemic symptoms that poses a threat to life or bodily functions Pulmonary infiltrates: acute illness or injury with systemic symptoms that poses a threat to life or bodily functions Uremia: acute illness or injury with systemic symptoms  Amount and/or Complexity of Data Reviewed Independent Historian: EMS    Details: hx External Data Reviewed: notes. Labs: ordered. Decision-making details documented in ED Course. Radiology: ordered and independent interpretation performed. Decision-making details documented in ED Course. ECG/medicine tests: ordered and independent interpretation performed. Decision-making details documented in ED Course. Discussion of management or test interpretation with external provider(s): Cardiology, nephrology,  medicine  Risk OTC drugs. Prescription drug management. Parenteral controlled substances. Decision regarding hospitalization.   Iv ns. Continuous pulse ox and cardiac monitoring. Labs ordered/sent. Imaging ordered.   Differential diagnosis includes dehydration, infection, anemia, aki, symptomatic bradycardia, etc. Dispo decision including potential need for admission considered - will get labs and imaging and reassess.   Cultures sent. Broad spectrum abx ordered. Initial ivf/lr ordered.   Reviewed nursing notes and prior charts for additional history. External reports reviewed. Additional history from: EMS.   Cardiac monitor: afib, rate 50.   Labs reviewed/interpreted by me - bun/cr very high. Bladder scan. Imaging. Ivf bolus. K is v high. Ca gluc iv, d50 iv, hco3 iv, insulin iv, lokelma.  Na is low. Ns bolus(es) ordered (and LR ivf d/c'd).  Additional labs subsequently reviewed, Bnp v high. Trop mildly high - pt denies chest pain/discomforrt.  INR > 10. Vit k dose iv.   Xrays reviewed/interpreted by me: +effusion.   CT reviewed/interpreted by me - mod pericardial effusion, suspected pna (iv abx given). Stat echo ordered.   Cardiology consulted - discussed pt, ct with pericardial effusion possible need for pericardiocentesis, bnp, slow afib, soft bp, etc. - they indicate pt known to them, indicate at times ct overstates pericardial effusion and recommends ordering echo, as relates bradycardia, for now recommends holding bblocker and continuing to correct electrolyte issues - indicates if echo concerning for needing emergent pericardiocentesis to call back, otherwise they will plan to see in consult in AM tomorrow.   Nephrology consulted re acute renal failure, uremia. Discussed pt, they  will place additional orders and see in consult.   Hospitalists consulted. Discussed pt  - given tenuous status and multi-organ issues, feels would benefit from icu.   PCCM consulted for admission -  discussed pt, they will see/admit.   Recheck pt, feels mildly improved from initial. Leg pain from leg/skin ulcers is improved w meds (pt had been requesting pain meds for same). Despite bun 133/uremia, pt is surprising alert, oriented, conversant.    CRITICAL CARE RE: AKI/ARF with hyperkalemia requiring parenteral therapy, uremia/altered ms/weakness, afib w bradycardia, elev bnp.  Performed by: Tanielle Emigh E Malikah Principato Total critical care time: 115 minutes Critical care time was exclusive of separately billable procedures and treating other patients. Critical care was necessary to treat or prevent imminent or life-threatening deterioration. Critical care was time spent personally by me on the following activities: development of treatment plan with patient and/or surrogate as well as nursing, discussions with consultants, evaluation of patient's response to treatment, examination of patient, obtaining history from patient or surrogate, ordering and performing treatments and interventions, ordering and review of laboratory studies, ordering and review of radiographic studies, pulse oximetry and re-evaluation of patient's condition.       Final diagnoses:  Generalized weakness  AKI (acute kidney injury)  Uremia  Hyperkalemia  Atrial fibrillation with slow ventricular response (HCC)  Pericardial effusion  Pulmonary infiltrates  Dehydration  Hypotension due to hypovolemia  Elevated brain natriuretic peptide (BNP) level  Hyponatremia  Bilateral leg edema  Ulcers of both lower legs [facility])    ED Discharge Orders     None           Bernard Drivers, MD 06/10/24 1547  "

## 2024-06-10 NOTE — H&P (Signed)
 "  NAME:  TYESHA JOFFE, MRN:  [id number], DOB:  23-May-1941, LOS: 0 ADMISSION DATE:  06/10/2024, CONSULTATION DATE:  1/2 REFERRING MD:  Dr Bernard, CHIEF COMPLAINT:  Hypotension  History of Present Illness:  84 year old female with PMH as below, which is significant for Atrial fibrillation and PE on warfarin, OSA on CPAP, sarcoidosis, HTN, thyroid  cancer, and HFpEF. She has recently been treated for cellulitis of both legs and admitted for atypical chest pain and AF RVR. She was discharged on lasix , doxycyline. Course also complicated by diffuse lymphadenopathy for which she has since had a biopsy and followed up with oncology for what is no longer felt to be a malignant process. She is now presenting to [facility] ED 1/2 with complaints of weakness. Unable to move at home so husband called EMS. En route to the ED she was noted to be hypotensive with systolic blood pressures in the 70s. Responded to IVF. Laboratory evaluation significant for renal failure and hyperkalemia and well as INR > 10. K was temporized in the ED. CT abdomen pelvis showed a moderate pericardial effusion vs hemopericardium and concern for left lower lobe pneumonia. PCCM asked to evaluate for hypotension and pericardial effusion.   Pertinent  Medical History   has a past medical history of Anxiety, Arthritis, Cancer (HCC), Cataracts, bilateral, Chronic back pain, Family history of adverse reaction to anesthesia, History of blood transfusion, History of bronchitis (05/2015), History of colon polyps, Hyperlipidemia, Hypertension, Hypothyroidism, Ischemic colitis, Joint pain, Joint swelling, OSA on CPAP, PE (pulmonary embolism), Peripheral edema, Pneumonia (1992), Sarcoidosis, Shortness of breath, Urinary frequency, Weakness, and Wheeze.   Significant Hospital Events: Including procedures, antibiotic start and stop dates in addition to other pertinent events   1/2 admit to ICU  Interim History / Subjective:    Objective     Blood pressure 91/65, pulse 62, temperature (!) 97.4 F (36.3 C), temperature source Oral, resp. rate (!) 23, height 5' 4 (1.626 m), weight 109.3 kg, SpO2 93%.        Intake/Output Summary (Last 24 hours) at 06/10/2024 1401 Last data filed at 06/10/2024 0646 Gross per 24 hour  Intake 300 ml  Output --  Net 300 ml   Filed Weights   06/10/24 0655  Weight: 109.3 kg    Examination: General: Obese elderly female in NAD HENT: [place]/AT, PERRL, no JVD Lungs: Clear bilateral breath sounds Cardiovascular: RRR, no MRG Abdomen: Soft, NT, ND Extremities: Wounds on the anterior surface of both lower extremities. 3+ lower extremity pitting edema.  Neuro: Alert, oriented, non-focal. Some confusion but overall interacting appropriately.   Echo: LVEF 45-50%, RV systolic function mildly reduced. RV size normal. Large pericardial effusion, no collapse of RV or RA. No concern for pericardial tamponade based on Echo.   CT abd/chest: Moderate pericardial effusion vs hemopericardium noted. (New from scan 12/4). Left lingular and left lower lobe opacities. Bilateral inguinal adenopathy.  Resolved problem list   Assessment and Plan   Hypotension: etiology not entirely clear. Likely hypovolemic considering recent poor intake and vomiting, but also BNP is elevated and she has edema so hard to get a true sense of her volume status. BP did improve with volume resuscitation. Cannot rule out severe sepsis. Lactic 1.8 - Continue gentle IVF with close monitoring of her volume status - Cefepime , linezolid  until sepsis can be ruled out.  - Blood cultures - UA pending - RVP, flu, COVID, RSV screen - Not currently requiring pressors  Pericardial effusion: Etiology again  unclear. Uremia, coagulopathy, volume may all be playing a role - Give vitamin K and one unit FFP  - Consider DDAVP - Trend chemistry - Nephrology consult pending - Cardiology to see  AKI Hyperkalemia - K temporized in ED - [facility] -  Appreciate nephrology - Give volume for now. Hopefully can avoid RRT. - Bicarb drip  Warfarin coagulopathy - Vitamin K and FFP as above. - Trend INR - holding warfarin  Hematochezia - correct coagulopathy and monitor  Atrial fibrillation - slow ventricular response. Rate now improved. HFrEF - unclear at this point if chronic or acute on chronic.  - Holding home diltiazem , furosemide , irbesartan , nebivolol , until hypotension improves - Cardiology to consult in AM  Lower extremity wounds: no clear signs of active infection - WOC consult  OSA on CPAP - at bedtime CPAP  Sarcoid - supportive care  Lymphadenopathy - biopsy negative, unlikely to me malignant per oncology.  - Supportive care  Hypothyroid  -check TSH - synthroid      Labs   CBC: Recent Labs  Lab 06/10/24 0824  WBC 9.6  HGB 14.2  HCT 42.8  MCV 88.4  PLT 240    Basic Metabolic Panel: Recent Labs  Lab 06/10/24 0824  NA 120*  K 7.0*  CL 91*  CO2 15*  GLUCOSE 154*  BUN 133*  CREATININE 4.67*  CALCIUM  9.1   GFR: Estimated Creatinine Clearance: 10.6 mL/min (A) (by C-G formula based on SCr of 4.67 mg/dL (H)). Recent Labs  Lab 06/10/24 0824 06/10/24 0825  WBC 9.6  --   LATICACIDVEN  --  1.8    Liver Function Tests: Recent Labs  Lab 06/10/24 0824  AST 33  ALT 29  ALKPHOS 160*  BILITOT 0.5  PROT 8.2*  ALBUMIN 3.1*   No results for input(s): LIPASE, AMYLASE in the last 168 hours. No results for input(s): AMMONIA in the last 168 hours.  ABG No results found for: PHART, PCO2ART, PO2ART, HCO3, TCO2, ACIDBASEDEF, O2SAT   Coagulation Profile: Recent Labs  Lab 06/10/24 0824  INR >10.0*    Cardiac Enzymes: No results for input(s): CKTOTAL, CKMB, CKMBINDEX, TROPONINI in the last 168 hours.  HbA1C: No results found for: HGBA1C  CBG: Recent Labs  Lab 06/10/24 0654  GLUCAP 168*    Review of Systems:   Bolds are positive  Constitutional: weight  loss, gain, night sweats, Fevers, chills, fatigue.  HEENT: headaches, Sore throat, sneezing, nasal congestion, post nasal drip, Difficulty swallowing, Tooth/dental problems, visual complaints visual changes, ear ache CV:  chest pain, radiates:,Orthopnea, PND, swelling in lower extremities, dizziness, palpitations, syncope.  GI  heartburn, indigestion, abdominal pain, nausea, vomiting, diarrhea, change in bowel habits, loss of appetite, bloody stools.  Resp: cough, productive: , hemoptysis, dyspnea, chest pain, pleuritic.  Skin: rash or itching or icterus GU: dysuria, change in color of urine, urgency or frequency. flank pain, hematuria  MS: joint pain or swelling. decreased range of motion  Psych: change in mood or affect. depression or anxiety.  Neuro: difficulty with speech, weakness, numbness, ataxia    Past Medical History:  She,  has a past medical history of Anxiety, Arthritis, Cancer (HCC), Cataracts, bilateral, Chronic back pain, Family history of adverse reaction to anesthesia, History of blood transfusion, History of bronchitis (05/2015), History of colon polyps, Hyperlipidemia, Hypertension, Hypothyroidism, Ischemic colitis, Joint pain, Joint swelling, OSA on CPAP, PE (pulmonary embolism), Peripheral edema, Pneumonia (1992), Sarcoidosis, Shortness of breath, Urinary frequency, Weakness, and Wheeze.   Surgical History:   Past Surgical  History:  Procedure Laterality Date   ABDOMINAL HYSTERECTOMY     APPENDECTOMY     BREAST BIOPSY     biopsies on right x 2   BREAST CYST ASPIRATION Left    CHOLECYSTECTOMY N/A 11/29/2015   Procedure: LAPAROSCOPIC CHOLECYSTECTOMY;  Surgeon: Jina Nephew, MD;  Location: [facility] OR;  Service: General;  Laterality: N/A;   COLONOSCOPY     DILATION AND CURETTAGE OF UTERUS     LIVER BIOPSY     MELANOMA EXCISION     removed from left leg   scalene surgery  1972   trying to find out what was wrong with her lungs   THYROID  SURGERY       Social History:    reports that she has never smoked. She has never used smokeless tobacco. She reports that she does not drink alcohol  and does not use drugs.   Family History:  Her family history includes Breast cancer in her mother, sister, and sister; CVA in her mother. There is no history of Heart attack or Sleep apnea.   Allergies Allergies[1]   Home Medications  Prior to Admission medications  Medication Sig Start Date End Date Taking? Authorizing Provider  acetaminophen  (TYLENOL ) 325 MG tablet Take 325-650 mg by mouth 2 (two) times daily as needed for mild pain or headache.    [provider]  Carboxymethylcellulose Sodium (REFRESH LIQUIGEL OP) Place 1 drop into both eyes at bedtime.    [provider]  diltiazem  (TIAZAC ) 360 MG 24 hr capsule Take 360 mg by mouth at bedtime.    [provider]  furosemide  (LASIX ) 40 MG tablet Take 40 mg by mouth daily.    [provider]  irbesartan  (AVAPRO ) 75 MG tablet Take 1 tablet (75 mg total) by mouth daily. 05/16/24   Gonfa, Taye T, MD  levothyroxine  (SYNTHROID ) 112 MCG tablet Take 112 mcg by mouth every morning.    [provider]  LORazepam  (ATIVAN ) 1 MG tablet Take 1.5 mg by mouth See admin instructions. Take 1.5 mg by mouth at bedtime and an additional 1-1.5mg  once a day as needed for anxiety    [provider]  nebivolol  (BYSTOLIC ) 5 MG tablet Take 1 tablet (5 mg total) by mouth daily. Patient taking differently: Take 5 mg by mouth daily at 12 noon. 09/27/22   Sheikh, Omair Latif, DO  senna-docusate (SENOKOT-S) 8.6-50 MG tablet Take 1-2 tablets by mouth 2 (two) times daily between meals as needed for mild constipation or moderate constipation. 05/16/24   Gonfa, Taye T, MD  SIMPLY SALINE NA Place 1 spray into both nostrils as needed (for dryness).    [provider]  triamcinolone cream (KENALOG) 0.1 % Apply 1 Application topically See admin instructions. Apply to both legs 2 times a day 04/05/21    [provider]  warfarin (COUMADIN ) 5 MG tablet Take 5 mg by mouth at bedtime. 04/25/15   [provider]     Critical care time: 38 minutes     Deward [place], AGACNP-BC  Pulmonary & Critical Care  See Amion for personal pager PCCM on call pager [phone] until 7pm. Please call Elink 7p-7a. [phone]  06/10/2024 3:52 PM              [1]  Allergies Allergen Reactions   Atenolol Other (See Comments)    Depression    Citalopram Hydrobromide Nausea Only and Other (See Comments)    Nausea/Fatigue   Clindamycin /Lincomycin Other (See Comments)  Via IV, has caused a metallic taste in the mouth    Codeine Other (See Comments)    Caused Hyperactivity and Dysphoria   Coenzyme Q10 Itching   Erythromycin Itching, Anxiety and Other (See Comments)    Also with jitters   Motrin [Ibuprofen] Other (See Comments)    Is to not take this   Statins Other (See Comments)    Leg muscle weakness w/ rosuvastatin and simvastatin    Latex Rash   Losartan Potassium Other (See Comments)    Muscle aches    "

## 2024-06-10 NOTE — ED Triage Notes (Signed)
 BIBA from home w/ c/o generalized weakness x2 weeks.  States patient seen at [facility] 2 weeks ago and told some issue with lymph nodes, had biopsy, no results or unsure of results.  Was seeing wound care for BLE lymphedema, but no longer receiving.  Per EMS, hypotensive en route, 76/30, Afib HR 40-60, 92% RA and RR 30. 20g placed to Left AC and received approx 300 ml LR.

## 2024-06-10 NOTE — Progress Notes (Signed)
 eLink Physician-Brief Progress Note Patient Name: [name] DOB: [birth year] MRN: [id number]   Date of Service  06/10/2024  HPI/Events of Note  BMET reviewed. Na 126 K 6.0. Similar AKI On bicarb gtt  eICU Interventions  HyperK protocol - insulin, dextrose  Lokelma BID started including tonight Calcium  gluconate ordered Repeat BMET in 2 hours     Intervention Category Intermediate Interventions: Electrolyte abnormality - evaluation and management  Mariela Rex Slater Staff 06/10/2024, 9:49 PM

## 2024-06-10 NOTE — Consult Note (Signed)
 [facility] Renal Consultation Note  Requesting MD: Dorn Chill, MD  Indication for Consultation:  hyperkalemia and AKI   Chief complaint: weakness  HPI:  [name] is a 84 y.o. female with a history of anxiety, HTN, OSA, and sarcoidosis who presented to the hospital for evaluation of weakness.  She has also recently had decreased PO intake and decreased urine output.  She states she visited an ER in December for chest pain and hasn't felt well for a while.  I spoke with her husband and he has been worried about her - she almost didn't have the strength to pick herself off the couch today.  Here she was mildly hypotensive.  They state that her blood pressure is normally high and recently at an appointment her blood pressure medication was reduced.  Home meds include lasix  and ARB and nebivolol .  She was found to have AKI and hyperkalemia with K of 7.0.  covid, flu, and RSV are negative.  Nephrology is consulted for assistance with management of the same.  She was given albuterol  in the ER as well as insulin/dextrose , bicarb 2 amps, lokelma, and calcium .  She also got 1.5 liters NS and an LR bolus.  We initiated bicarb gtt.  With regard to her creatinine, Cr 4.67 on 1/2 on presentation.  Repeat labs with Cr 4.11.  Note that she has normal Cr usually (less than 1).  For reference, on 05/27/24 Cr 1.25 and K 5.4.  Labs were obtained when she had seen Dr. Federico for evaluation of lymphadenopathy.     Creatinine, Ser  Date/Time Value Ref Range Status  06/10/2024 02:36 PM 4.11 (H) 0.44 - 1.00 mg/dL Final  98/97/7973 91:75 AM 4.67 (H) 0.44 - 1.00 mg/dL Final  87/91/7974 93:57 AM 0.80 0.44 - 1.00 mg/dL Final  87/92/7974 95:41 AM 0.89 0.44 - 1.00 mg/dL Final  87/93/7974 90:87 AM 0.93 0.44 - 1.00 mg/dL Final  87/94/7974 95:87 AM 0.94 0.44 - 1.00 mg/dL Final  87/95/7974 96:51 PM 0.90 0.44 - 1.00 mg/dL Final  88/69/7974 98:90 PM 0.75 0.57 - 1.00 mg/dL Final  96/93/7974 89:42  AM 0.81 0.44 - 1.00 mg/dL Final  91/94/7975 89:89 AM 0.80 0.57 - 1.00 mg/dL Final  95/80/7975 96:41 AM 0.88 0.44 - 1.00 mg/dL Final  95/81/7975 95:75 AM 0.74 0.44 - 1.00 mg/dL Final  95/82/7975 95:98 AM 0.86 0.44 - 1.00 mg/dL Final  95/83/7975 95:92 AM 0.67 0.44 - 1.00 mg/dL Final  95/84/7975 95:72 AM 0.67 0.44 - 1.00 mg/dL Final  95/85/7975 95:63 AM 0.69 0.44 - 1.00 mg/dL Final  95/86/7975 94:70 AM 0.55 0.44 - 1.00 mg/dL Final  95/87/7975 93:69 AM 0.66 0.44 - 1.00 mg/dL Final  95/88/7975 96:80 PM 0.89 0.44 - 1.00 mg/dL Final  96/91/7975 98:98 PM 0.73 0.57 - 1.00 mg/dL Final  88/70/7977 97:62 PM 0.59 0.44 - 1.00 mg/dL Final  93/78/7982 91:55 AM 0.79 0.44 - 1.00 mg/dL Final  90/70/7986 89:84 AM 0.89 0.50 - 1.10 mg/dL Final  89/92/7987 93:80 PM 0.80 0.50 - 1.10 mg/dL Final  88/72/7988 97:75 PM 0.8 0.4 - 1.2 mg/dL Final  95/96/7989 94:69 AM 0.75 0.4 - 1.2 mg/dL Final  95/97/7989 94:66 AM 0.75 0.4 - 1.2 mg/dL Final  95/98/7989 95:94 PM 0.74 0.4 - 1.2 mg/dL Final  96/73/7990 94:64 PM 0.76  Final  05/24/2007 02:17 PM 0.80  Final     PMHx:   Past Medical History:  Diagnosis Date   Anxiety    takes Lorazepam  daily as needed  Arthritis    Cancer (HCC)    thyroid    Cataracts, bilateral    immature   Chronic back pain    compressed vertebrae,takes Fosamax weekly   Family history of adverse reaction to anesthesia    granddaughter gets very sick and mean   History of blood transfusion    no abnormal reaction noted   History of bronchitis 05/2015   History of colon polyps    benign   Hyperlipidemia    takes Zetia  daily   Hypertension    takes Diltiazem  and Avapro  daily   Hypothyroidism    takes Synthroid  daily   Ischemic colitis    hx of-many yrs ago   Joint pain    Joint swelling    OSA on CPAP    05-08-12 AHi was 46, RDI  53. titrated to   9 cm water  with 3 cm flex.-nadir 75%   PE (pulmonary embolism)    takes Coumadin  daily   Peripheral edema    Pneumonia 1992   hx  of   Sarcoidosis    Shortness of breath    Urinary frequency    Weakness    left leg.Numbness and tingling.Has sciatica   Wheeze    occaionally. Not new    Past Surgical History:  Procedure Laterality Date   ABDOMINAL HYSTERECTOMY     APPENDECTOMY     BREAST BIOPSY     biopsies on right x 2   BREAST CYST ASPIRATION Left    CHOLECYSTECTOMY N/A 11/29/2015   Procedure: LAPAROSCOPIC CHOLECYSTECTOMY;  Surgeon: Jina Nephew, MD;  Location: [facility] OR;  Service: General;  Laterality: N/A;   COLONOSCOPY     DILATION AND CURETTAGE OF UTERUS     LIVER BIOPSY     MELANOMA EXCISION     removed from left leg   scalene surgery  1972   trying to find out what was wrong with her lungs   THYROID  SURGERY      Family Hx:  Family History  Problem Relation Age of Onset   Breast cancer Mother    CVA Mother    Breast cancer Sister    Breast cancer Sister    Heart attack Neg Hx    Sleep apnea Neg Hx     Social History:  reports that she has never smoked. She has never used smokeless tobacco. She reports that she does not drink alcohol  and does not use drugs.  Allergies: Allergies[1]  Medications: Prior to Admission medications  Medication Sig Start Date End Date Taking? Authorizing Provider  acetaminophen  (TYLENOL ) 325 MG tablet Take 325-650 mg by mouth 2 (two) times daily as needed for mild pain or headache.    [provider]  Carboxymethylcellulose Sodium (REFRESH LIQUIGEL OP) Place 1 drop into both eyes at bedtime.    [provider]  diltiazem  (TIAZAC ) 360 MG 24 hr capsule Take 360 mg by mouth at bedtime.    [provider]  furosemide  (LASIX ) 40 MG tablet Take 40 mg by mouth daily.    [provider]  irbesartan  (AVAPRO ) 75 MG tablet Take 1 tablet (75 mg total) by mouth daily. 05/16/24   Gonfa, Taye T, MD  levothyroxine  (SYNTHROID ) 112 MCG tablet Take 112 mcg by mouth every morning.    [provider]  LORazepam  (ATIVAN ) 1 MG tablet Take 1.5  mg by mouth See admin instructions. Take 1.5 mg by mouth at bedtime and an additional 1-1.5mg  once a day as needed for anxiety  [provider]  nebivolol  (BYSTOLIC ) 5 MG tablet Take 1 tablet (5 mg total) by mouth daily. Patient taking differently: Take 5 mg by mouth daily at 12 noon. 09/27/22   Sheikh, Omair Latif, DO  senna-docusate (SENOKOT-S) 8.6-50 MG tablet Take 1-2 tablets by mouth 2 (two) times daily between meals as needed for mild constipation or moderate constipation. 05/16/24   Gonfa, Taye T, MD  SIMPLY SALINE NA Place 1 spray into both nostrils as needed (for dryness).    [provider]  triamcinolone cream (KENALOG) 0.1 % Apply 1 Application topically See admin instructions. Apply to both legs 2 times a day 04/05/21   [provider]  warfarin (COUMADIN ) 5 MG tablet Take 5 mg by mouth at bedtime. 04/25/15   [provider]    I have reviewed the patient's current and reported prior to admission medications.   Labs:     Latest Ref Rng & Units 06/10/2024    2:36 PM 06/10/2024    8:24 AM 05/27/2024    2:09 PM  BMP  Glucose 70 - 99 mg/dL 881  845  877   BUN 8 - 23 mg/dL 875  866  43   Creatinine 0.44 - 1.00 mg/dL 5.88  5.32  8.74   Sodium 135 - 145 mmol/L 126  120  133   Potassium 3.5 - 5.1 mmol/L 5.8  7.0  5.4   Chloride 98 - 111 mmol/L 95  91  96   CO2 22 - 32 mmol/L 18  15  26    Calcium  8.9 - 10.3 mg/dL 8.3  9.1  89.7     Urinalysis    Component Value Date/Time   COLORURINE YELLOW 06/10/2024 1450   APPEARANCEUR HAZY (A) 06/10/2024 1450   LABSPEC 1.026 06/10/2024 1450   PHURINE 5.0 06/10/2024 1450   GLUCOSEU NEGATIVE 06/10/2024 1450   HGBUR NEGATIVE 06/10/2024 1450   BILIRUBINUR NEGATIVE 06/10/2024 1450   KETONESUR NEGATIVE 06/10/2024 1450   PROTEINUR 30 (A) 06/10/2024 1450   UROBILINOGEN 0.2 06/02/2007 1024   NITRITE NEGATIVE 06/10/2024 1450   LEUKOCYTESUR SMALL (A) 06/10/2024 1450     ROS:  Pertinent items noted in HPI and  remainder of comprehensive ROS otherwise negative.  Physical Exam: Vitals:   06/10/24 1700 06/10/24 1800  BP: (!) 101/57 (!) 95/46  Pulse: 66 69  Resp: 16 15  Temp:    SpO2: 95% 92%      General:  elderly female in bed in NAD  HEENT: NCAT Eyes: EOMI sclera anicteric Neck: supple trachea midline  Heart: S1S2 no rub  Lungs: clear to auscultation; normal work of breathing at rest; on 2 liters Abdomen: soft/nt/nd  Extremities: no edema; no cyanosis or clubbing   Skin: no rash on extremities exposed Neuro:  alert and oriented x 3 provides hx and follows commands  Psych: normal mood and affect   Assessment/Plan:   # Hyperkalemia - Secondary to AKI as well as the setting of ARB use - improving with medical measures  - repeat BMP now  - hold irbesartan   # AKI  - Secondary to ischemic and pre-renal insults from hypotension and prolonged decreased PO intake.  Intake UA with 30 mg/dL protein and 0-5 RBC  - Improving with supportive measures  - Continue bicarb gtt - Hold irbesartan    # Hyponatremia  - hypovolemic - Improving   - Continue bicarb gtt - directed at K then reassess fluids tomorrow  # Hypotension - Note hx of HTN  -  hold home irbesartan  and nebivolol   # Metabolic acidosis - on bicarb gtt  Thank you for the consult.  Please do not hesitate to contact me with any questions regarding our patient.    Katheryn JAYSON Saba 06/10/2024, 7:41 PM        [1]  Allergies Allergen Reactions   Atenolol Other (See Comments)    Depression    Citalopram Hydrobromide Nausea Only and Other (See Comments)    Nausea/Fatigue   Clindamycin /Lincomycin Other (See Comments)    Via IV, has caused a metallic taste in the mouth    Codeine Other (See Comments)    Caused Hyperactivity and Dysphoria   Coenzyme Q10 Itching   Erythromycin Itching, Anxiety and Other (See Comments)    Also with jitters   Motrin [Ibuprofen] Other (See Comments)    Is to not take this   Statins Other  (See Comments)    Leg muscle weakness w/ rosuvastatin and simvastatin    Latex Rash   Losartan Potassium Other (See Comments)    Muscle aches

## 2024-06-10 NOTE — Progress Notes (Signed)
 Pharmacy Antibiotic Note  [name] is a 84 y.o. female admitted on 06/10/2024 with weakness,  pneumonia.  Pharmacy has been consulted for Cefepime  dosing.  Plan: Cefepime  2g IV q24h Follow up renal function, culture results, and clinical course.   Height: 5' 4 (162.6 cm) Weight: 109.3 kg (240 lb 15.4 oz) IBW/kg (Calculated) : 54.7  Temp (24hrs), Avg:97.4 F (36.3 C), Min:97.4 F (36.3 C), Max:97.4 F (36.3 C)  Recent Labs  Lab 06/10/24 0824 06/10/24 0825 06/10/24 1436  WBC 9.6  --   --   CREATININE 4.67*  --  4.11*  LATICACIDVEN  --  1.8  --     Estimated Creatinine Clearance: 12.1 mL/min (A) (by C-G formula based on SCr of 4.11 mg/dL (H)).    Allergies[1]  Antimicrobials this admission: 1/2 Cefepime  >> 1/2 Metronidazole x1 1/2 Vancomycin  x1 1/4 Linezolid  >>   Dose adjustments this admission:   Microbiology results: 1/2 BCx:  1/2 Resp panel: neg covid, flu, rsv  1/2 MRSA PCR:   Thank you for allowing pharmacy to be a part of this patients care.  Wanda Hasting PharmD, BCPS [facility] main pharmacy [phone] 06/10/2024 3:11 PM      [1]  Allergies Allergen Reactions   Atenolol Other (See Comments)    Depression    Citalopram Hydrobromide Nausea Only and Other (See Comments)    Nausea/Fatigue   Clindamycin /Lincomycin Other (See Comments)    Via IV, has caused a metallic taste in the mouth    Codeine Other (See Comments)    Caused Hyperactivity and Dysphoria   Coenzyme Q10 Itching   Erythromycin Itching, Anxiety and Other (See Comments)    Also with jitters   Motrin [Ibuprofen] Other (See Comments)    Is to not take this   Statins Other (See Comments)    Leg muscle weakness w/ rosuvastatin and simvastatin    Latex Rash   Losartan Potassium Other (See Comments)    Muscle aches

## 2024-06-11 DIAGNOSIS — L049 Acute lymphadenitis, unspecified: Secondary | ICD-10-CM

## 2024-06-11 DIAGNOSIS — D689 Coagulation defect, unspecified: Secondary | ICD-10-CM

## 2024-06-11 DIAGNOSIS — I3139 Other pericardial effusion (noninflammatory): Secondary | ICD-10-CM | POA: Diagnosis not present

## 2024-06-11 DIAGNOSIS — I4891 Unspecified atrial fibrillation: Secondary | ICD-10-CM | POA: Diagnosis not present

## 2024-06-11 DIAGNOSIS — I110 Hypertensive heart disease with heart failure: Secondary | ICD-10-CM

## 2024-06-11 DIAGNOSIS — E861 Hypovolemia: Secondary | ICD-10-CM

## 2024-06-11 DIAGNOSIS — N170 Acute kidney failure with tubular necrosis: Secondary | ICD-10-CM

## 2024-06-11 LAB — CBC
HCT: 27.1 % — ABNORMAL LOW (ref 36.0–46.0)
Hemoglobin: 9.3 g/dL — ABNORMAL LOW (ref 12.0–15.0)
MCH: 29 pg (ref 26.0–34.0)
MCHC: 34.3 g/dL (ref 30.0–36.0)
MCV: 84.4 fL (ref 80.0–100.0)
Platelets: 241 K/uL (ref 150–400)
RBC: 3.21 MIL/uL — ABNORMAL LOW (ref 3.87–5.11)
RDW: 15.1 % (ref 11.5–15.5)
WBC: 14.6 K/uL — ABNORMAL HIGH (ref 4.0–10.5)
nRBC: 0 % (ref 0.0–0.2)

## 2024-06-11 LAB — BLOOD CULTURE ID PANEL (REFLEXED) - BCID2

## 2024-06-11 LAB — GLUCOSE, CAPILLARY
Glucose-Capillary: 75 mg/dL (ref 70–99)
Glucose-Capillary: 80 mg/dL (ref 70–99)
Glucose-Capillary: 88 mg/dL (ref 70–99)
Glucose-Capillary: 90 mg/dL (ref 70–99)
Glucose-Capillary: 94 mg/dL (ref 70–99)
Glucose-Capillary: 98 mg/dL (ref 70–99)

## 2024-06-11 LAB — BASIC METABOLIC PANEL WITH GFR
Anion gap: 13 (ref 5–15)
Anion gap: 14 (ref 5–15)
Anion gap: 15 (ref 5–15)
Anion gap: 16 — ABNORMAL HIGH (ref 5–15)
BUN: 103 mg/dL — ABNORMAL HIGH (ref 8–23)
BUN: 116 mg/dL — ABNORMAL HIGH (ref 8–23)
BUN: 120 mg/dL — ABNORMAL HIGH (ref 8–23)
BUN: 121 mg/dL — ABNORMAL HIGH (ref 8–23)
CO2: 20 mmol/L — ABNORMAL LOW (ref 22–32)
CO2: 20 mmol/L — ABNORMAL LOW (ref 22–32)
CO2: 23 mmol/L (ref 22–32)
CO2: 26 mmol/L (ref 22–32)
Calcium: 8.4 mg/dL — ABNORMAL LOW (ref 8.9–10.3)
Calcium: 8.6 mg/dL — ABNORMAL LOW (ref 8.9–10.3)
Calcium: 9.1 mg/dL (ref 8.9–10.3)
Calcium: 9.1 mg/dL (ref 8.9–10.3)
Chloride: 91 mmol/L — ABNORMAL LOW (ref 98–111)
Chloride: 91 mmol/L — ABNORMAL LOW (ref 98–111)
Chloride: 91 mmol/L — ABNORMAL LOW (ref 98–111)
Chloride: 91 mmol/L — ABNORMAL LOW (ref 98–111)
Creatinine, Ser: 2.19 mg/dL — ABNORMAL HIGH (ref 0.44–1.00)
Creatinine, Ser: 3 mg/dL — ABNORMAL HIGH (ref 0.44–1.00)
Creatinine, Ser: 3.58 mg/dL — ABNORMAL HIGH (ref 0.44–1.00)
Creatinine, Ser: 3.85 mg/dL — ABNORMAL HIGH (ref 0.44–1.00)
GFR, Estimated: 11 mL/min — ABNORMAL LOW
GFR, Estimated: 12 mL/min — ABNORMAL LOW
GFR, Estimated: 15 mL/min — ABNORMAL LOW
GFR, Estimated: 21 mL/min — ABNORMAL LOW
Glucose, Bld: 111 mg/dL — ABNORMAL HIGH (ref 70–99)
Glucose, Bld: 80 mg/dL (ref 70–99)
Glucose, Bld: 82 mg/dL (ref 70–99)
Glucose, Bld: 96 mg/dL (ref 70–99)
Potassium: 4 mmol/L (ref 3.5–5.1)
Potassium: 4.7 mmol/L (ref 3.5–5.1)
Potassium: 5 mmol/L (ref 3.5–5.1)
Potassium: 5.3 mmol/L — ABNORMAL HIGH (ref 3.5–5.1)
Sodium: 126 mmol/L — ABNORMAL LOW (ref 135–145)
Sodium: 126 mmol/L — ABNORMAL LOW (ref 135–145)
Sodium: 127 mmol/L — ABNORMAL LOW (ref 135–145)
Sodium: 129 mmol/L — ABNORMAL LOW (ref 135–145)

## 2024-06-11 LAB — PROTIME-INR
INR: 2.1 — ABNORMAL HIGH (ref 0.8–1.2)
Prothrombin Time: 24.8 s — ABNORMAL HIGH (ref 11.4–15.2)

## 2024-06-11 LAB — TSH: TSH: 9.53 u[IU]/mL — ABNORMAL HIGH (ref 0.350–4.500)

## 2024-06-11 LAB — SODIUM, URINE, RANDOM: Sodium, Ur: <30 mmol/L

## 2024-06-11 LAB — PHOSPHORUS: Phosphorus: 6 mg/dL — ABNORMAL HIGH (ref 2.5–4.6)

## 2024-06-11 LAB — OSMOLALITY, URINE: Osmolality, Ur: 459 mosm/kg (ref 300–900)

## 2024-06-11 LAB — STREP PNEUMONIAE URINARY ANTIGEN: Strep Pneumo Urinary Antigen: NEGATIVE

## 2024-06-11 LAB — MAGNESIUM: Magnesium: 3 mg/dL — ABNORMAL HIGH (ref 1.7–2.4)

## 2024-06-11 LAB — CREATININE, URINE, RANDOM: Creatinine, Urine: 113 mg/dL

## 2024-06-11 MED ORDER — METOPROLOL TARTRATE 12.5 MG HALF TABLET: 12.5000 mg | ORAL_TABLET | Freq: Two times a day (BID) | ORAL | Status: DC

## 2024-06-11 MED ORDER — SODIUM CHLORIDE 0.9 % IV BOLUS
1500.0000 mL | Freq: Once | INTRAVENOUS | Status: AC
  Administered 2024-06-11: 1500 mL via INTRAVENOUS

## 2024-06-11 MED ORDER — NYSTATIN 100000 UNIT/GM EX POWD
Freq: Two times a day (BID) | CUTANEOUS | Status: DC
  Filled 2024-06-11: qty 15

## 2024-06-11 MED ORDER — SODIUM CHLORIDE 0.9 % IV SOLN: INTRAVENOUS | Status: AC

## 2024-06-11 MED ORDER — SODIUM CHLORIDE 0.9 % IV BOLUS: 750.0000 mL | Freq: Once | INTRAVENOUS | Status: DC

## 2024-06-11 NOTE — Progress Notes (Addendum)
 "  NAME:  [name], MRN:  [id number], DOB:  1940/11/30, LOS: 1 ADMISSION DATE:  06/10/2024, CONSULTATION DATE:  1/2 REFERRING MD:  Dr Bernard, CHIEF COMPLAINT:  Hypotension  History of Present Illness:  84 year old female with PMH as below, which is significant for Atrial fibrillation and PE on warfarin, OSA on CPAP, sarcoidosis, HTN, thyroid  cancer, and HFpEF. She has recently been treated for cellulitis of both legs and admitted for atypical chest pain and AF RVR. She was discharged on lasix , doxycyline. Course also complicated by diffuse lymphadenopathy for which she has since had a biopsy and followed up with oncology for what is no longer felt to be a malignant process. She is now presenting to [facility] ED 1/2 with complaints of weakness. Unable to move at home so husband called EMS. En route to the ED she was noted to be hypotensive with systolic blood pressures in the 70s. Responded to IVF. Laboratory evaluation significant for renal failure and hyperkalemia and well as INR > 10. K was temporized in the ED. CT abdomen pelvis showed a moderate pericardial effusion vs hemopericardium and concern for left lower lobe pneumonia. PCCM asked to evaluate for hypotension and pericardial effusion.   Pertinent  Medical History   has a past medical history of Anxiety, Arthritis, Cancer (HCC), Cataracts, bilateral, Chronic back pain, Family history of adverse reaction to anesthesia, History of blood transfusion, History of bronchitis (05/2015), History of colon polyps, Hyperlipidemia, Hypertension, Hypothyroidism, Ischemic colitis, Joint pain, Joint swelling, OSA on CPAP, PE (pulmonary embolism), Peripheral edema, Pneumonia (1992), Sarcoidosis, Shortness of breath, Urinary frequency, Weakness, and Wheeze.   Significant Hospital Events: Including procedures, antibiotic start and stop dates in addition to other pertinent events   1/2 admit to ICU Blood culture 1/2 > 1 of 2 GPC, BC ID negative so  far 1/2 echo: LVEF 45-50%, RV systolic function mildly reduced. RV size normal. Large pericardial effusion, no collapse of RV or RA. No concern for pericardial tamponade based on Echo.  1/2 CT abd/chest: Moderate pericardial effusion vs hemopericardium noted. (New from scan 12/4). Left lingular and left lower lobe opacities. Bilateral inguinal adenopathy.  Interim History / Subjective:  Hyperkalemia 1/2, treated medically: K+ 6.0 > 5.0 > 5.3 SCr 4.22 > 3.85 > 3.58 I/O+ 2.8 L total, urine output 300 cc last 24 hours Blood culture noted, 1 of 2 GPC She had pain overnight in both lower extremities where she has wounds She has not seen any more hematochezia, epistaxis   Objective    Blood pressure (!) 105/58, pulse 87, temperature (!) 97.4 F (36.3 C), temperature source Axillary, resp. rate 20, height 5' 4 (1.626 m), weight 95.9 kg, SpO2 93%.        Intake/Output Summary (Last 24 hours) at 06/11/2024 0728 Last data filed at 06/11/2024 0354 Gross per 24 hour  Intake 2845.98 ml  Output 300 ml  Net 2545.98 ml   Filed Weights   06/10/24 0655 06/10/24 1521 06/11/24 0500  Weight: 109.3 kg 104.8 kg 95.9 kg    Examination: General: Elderly woman laying in bed, no distress HENT: Strong voice, no secretions, oropharynx clear Lungs: Decreased to both bases, otherwise clear Cardiovascular: Regular, distant, no murmur Abdomen: Obese, soft, nondistended with positive bowel sounds Extremities: 2+ pitting edema.  Wounds on anterior surface of both lower extremities are wrapped. Neuro: Awake and answering questions.  Well-oriented.  Moves all extremities    Resolved problem list   Assessment and Plan   Hypotension  at presentation: etiology not entirely clear. Likely hypovolemic considering recent poor intake and vomiting, but also BNP is elevated and she has edema so hard to get a true sense of her volume status. BP did improve with volume resuscitation.  Consider severe sepsis. Lactic  1.8 -Continue gentle IV fluids and monitor volume status closely - Question whether blood culture true organism or contaminant.  Continue linezolid  for now and follow culture to completion.  Will stop cefepime  1/3.  Low threshold to restart if she decompensates in any way - RVP is negative, COVID/flu/RSV negative  Pericardial effusion: Etiology again unclear. Uremia, coagulopathy, volume may all be playing a role -Coagulopathy treated medically, follow INR and continue to treat as indicated -Cardiology to see, echocardiogram without any evidence of tamponade physiology - Working to treat renal failure/uremia  AKI, presumed due to hypovolemia/ATN Hyperkalemia -Treating potassium medically: Lokelma  currently ordered twice daily - Bicarbonate infusion, plan to continue for now as we follow renal function for improvement - Appreciate nephrology consultation and management.  Hopefully will be able to avoid hemodialysis  Warfarin coagulopathy - Vitamin K  given, FFP intermittently as indicated - Follow for any evidence of active bleeding - Repeat INR now and trend - Warfarin on hold  Hematochezia - Correcting coagulopathy, monitoring CBC and clinically  Atrial fibrillation - slow ventricular response. Rate now improved. HFrEF - unclear at this point if chronic or acute on chronic.  - Holding home diltiazem , furosemide , irbesartan , nebivolol , until hypotension improves - Cardiology to consult in AM  Lower extremity wounds: no clear signs of active infection, but consider cellulitis.  She has had pain - Appreciate WOC - Empiric antibiotic as above  OSA on CPAP - CPAP nightly  Sarcoid - Supportive care  Lymphadenopathy -inguinal node biopsy negative 12/19, unlikely to be malignant per oncology.  - Supportive care  Hypothyroidism  - Elevated TSH - Continue current Synthroid  as ordered, consider dose increase     Labs   CBC: Recent Labs  Lab 06/10/24 0824 06/11/24 0440   WBC 9.6 14.6*  HGB 14.2 9.3*  HCT 42.8 27.1*  MCV 88.4 84.4  PLT 240 241    Basic Metabolic Panel: Recent Labs  Lab 06/10/24 0824 06/10/24 1436 06/10/24 1934 06/10/24 2342 06/11/24 0244  NA 120* 126* 126* 126* 126*  K 7.0* 5.8* 6.0* 5.0 5.3*  CL 91* 95* 93* 91* 91*  CO2 15* 18* 17* 20* 20*  GLUCOSE 154* 118* 92 111* 82  BUN 133* 124* 131* 121* 120*  CREATININE 4.67* 4.11* 4.22* 3.85* 3.58*  CALCIUM  9.1 8.3* 9.0 9.1 9.1  MG  --   --   --   --  3.0*  PHOS  --   --   --   --  6.0*   GFR: Estimated Creatinine Clearance: 12.9 mL/min (A) (by C-G formula based on SCr of 3.58 mg/dL (H)). Recent Labs  Lab 06/10/24 0824 06/10/24 0825 06/11/24 0440  WBC 9.6  --  14.6*  LATICACIDVEN  --  1.8  --     Liver Function Tests: Recent Labs  Lab 06/10/24 0824  AST 33  ALT 29  ALKPHOS 160*  BILITOT 0.5  PROT 8.2*  ALBUMIN 3.1*   No results for input(s): LIPASE, AMYLASE in the last 168 hours. No results for input(s): AMMONIA in the last 168 hours.  ABG No results found for: PHART, PCO2ART, PO2ART, HCO3, TCO2, ACIDBASEDEF, O2SAT   Coagulation Profile: Recent Labs  Lab 06/10/24 0824  INR >10.0*  Cardiac Enzymes: No results for input(s): CKTOTAL, CKMB, CKMBINDEX, TROPONINI in the last 168 hours.  HbA1C: Hgb A1c MFr Bld  Date/Time Value Ref Range Status  06/10/2024 04:41 PM 7.6 (H) 4.8 - 5.6 % Final    Comment:    (NOTE) Diagnosis of Diabetes The following HbA1c ranges recommended by the American Diabetes Association (ADA) may be used as an aid in the diagnosis of diabetes mellitus.  Hemoglobin             Suggested A1C NGSP%              Diagnosis  <5.7                   Non Diabetic  5.7-6.4                Pre-Diabetic  >6.4                   Diabetic  <7.0                   Glycemic control for                       adults with diabetes.      CBG: Recent Labs  Lab 06/10/24 0654 06/10/24 1638 06/10/24 2013  06/10/24 2303 06/11/24 0441  GLUCAP 168* 103* 86 133* 75    Critical care time: 33 minutes    Lamar Chris, MD, PhD 06/11/2024, 7:28 AM [facility] [phone] or if no answer before 7:00PM call [phone] For any issues after 7:00PM please call eLink [phone]         "

## 2024-06-11 NOTE — Progress Notes (Signed)
" °   06/11/24 2316  BiPAP/CPAP/SIPAP  BiPAP/CPAP/SIPAP Pt Type Adult  Reason BIPAP/CPAP not in use Non-compliant (pt prefers to wear oxygen  tonight..she states she will wear it when her family brings her home machine  in..encouraged pt to call if she changes her mind)  BiPAP/CPAP /SiPAP Vitals  Pulse Rate (!) 112  Resp 16  SpO2 96 %  MEWS Score/Color  MEWS Score 2  MEWS Score Color Yellow    "

## 2024-06-11 NOTE — Progress Notes (Signed)
 eLink Physician-Brief Progress Note Patient Name: ADALEY KIENE DOB: 02/20/1941 MRN: [id number]   Date of Service  06/11/2024  HPI/Events of Note  Moisture associated skin breakdown under the breasts  eICU Interventions  Nystatin  powder     Intervention Category Minor Interventions: Routine modifications to care plan (e.g. PRN medications for pain, fever)  Yakub Lodes 06/11/2024, 9:43 PM

## 2024-06-11 NOTE — Progress Notes (Addendum)
 [facility] Progress Note  Subjective:  Seen in room Mouth is still very dry per the patient UOP 300 cc yest, 600 cc today so far Creat down 2.1  Presentation summary: 84 y.o. female with a history of anxiety, HTN, OSA, and sarcoidosis who presented to the hospital for evaluation of weakness.  She has also recently had decreased PO intake and decreased urine output.  She states she visited an ER in December for chest pain and hasn't felt well for a while. Here she was mildly hypotensive.  They state that her blood pressure is normally high and recently at an appointment her blood pressure medication was reduced.  Home meds include lasix  and ARB and nebivolol .  She was found to have AKI and hyperkalemia with K of 7.0.  covid, flu, and RSV are negative.  Nephrology is consulted for assistance with management of the same.  She was given albuterol  in the ER as well as insulin /dextrose , bicarb 2 amps, lokelma , and calcium .  She also got 1.5 liters NS and an LR bolus.  We initiated bicarb gtt.  With regard to her creatinine, Cr 4.67 on 1/2 on presentation.  Repeat labs with Cr 4.11.  Note that she has normal Cr usually (less than 1).  For reference, on 05/27/24 Cr 1.25 and K 5.4.  Labs were obtained when she had seen Dr. Federico for evaluation of lymphadenopathy.     Vitals:   06/11/24 1730 06/11/24 1800 06/11/24 1900 06/11/24 2000  BP: 94/73 103/63 120/60 124/62  Pulse: (!) 129 (!) 103 92 (!) 107  Resp: 20 11 18 19   Temp:      TempSrc:      SpO2: 95% 95% (!) 89% 97%  Weight:      Height:        Exam: General: elderly pt , no distress Heart: S1S2 no rub  Lungs: clear to auscultation; normal wob, on [place] O2 Abdomen: soft/nt/nd  Extremities: no edema; no cyanosis or clubbing   Neuro:  Ox 3, responsive  UA 06/10/24 -> prot 30, neg nit/ Hb CT abd/ pelv/ chest w/o contrast =-> kidneys are normal, without renal calculi, focal lesion, or hydronephrosis. Bladder is unremarkable BP's  here occ low in 90s, 100-120's day #1 Today day #2 bp's 90s-100s mostly , occ 110s +blood cx's --> GPC, staph species    Assessment/ Plan:  # AKI  - b/l creatinine 0.8- 1.0 from nov-dec 2025 - creat 4.6 on admission due to ischemic and pre-renal insults from hypotension and prolonged decreased PO intake - creatinine continues to improve, down to 2.1 this am - UA w/ min prot, o/w negative - CT ->  no hydronephrosis - Improving with supportive measures, UOP better  - switch to NS at 100 cc/hr, still dry - rebolus 1.5 L NS  - Hold irbesartan     # Hyperkalemia - Secondary to AKI as well as the setting of ARB use - K 7.0 initially, now resolved in 4-5 range - cont to avoid ARB/ acei/ nsaids   # Hyponatremia  - hypovolemic - Improving , up to 129 today (initially 120)   # Hypotension - Note hx of HTN  - hold home irbesartan  and nebivolol    # Metabolic acidosis - resolved        Myer Fret MD  [facility] 06/11/2024, 8:34 PM  Recent Labs  Lab 06/10/24 0824 06/10/24 1436 06/11/24 0244 06/11/24 0440 06/11/24 0809 06/11/24 1905  HGB 14.2  --   --  9.3*  --   --  ALBUMIN 3.1*  --   --   --   --   --   CALCIUM  9.1   < > 9.1  --  8.6* 8.4*  PHOS  --   --  6.0*  --   --   --   CREATININE 4.67*   < > 3.58*  --  3.00* 2.19*  K 7.0*   < > 5.3*  --  4.7 4.0   < > = values in this interval not displayed.   No results for input(s): IRON, TIBC, FERRITIN in the last 168 hours. Inpatient medications:  Chlorhexidine  Gluconate Cloth  6 each Topical Daily   insulin  aspart  0-15 Units Subcutaneous Q4H   levothyroxine   112 mcg Oral Q0600    [START ON 06/12/2024] linezolid  (ZYVOX ) IV     sodium bicarbonate  150 mEq in sterile water  1,150 mL infusion 100 mL/hr at 06/11/24 1610   acetaminophen , HYDROmorphone  (DILAUDID ) injection, ondansetron  (ZOFRAN ) IV, mouth rinse, oxyCODONE , polyethylene glycol, senna

## 2024-06-11 NOTE — Consult Note (Addendum)
 "  Cardiology Consultation   Patient ID: [name] MRN: [id number]; DOB: [birth year]  Admit date: 06/10/2024 Date of Consult: 06/11/2024  PCP:  [name], MD   [facility] Providers Cardiologist:  Madonna Large, DO        Patient Profile: [name] is a 84 y.o. female with a hx of longstanding persistent AF (on warfarin), PE/DVT, HFpEF, HTN, HLD, new DM2, sarcoid, OSA and obesity who is being seen 06/11/2024 for the evaluation of pericardial effusion at the request of Dr. Shelah.  History of Present Illness: [name] was admitted to the ICU after presenting for 2 weeks of progressive weakness, nausea, vomiting, and anorexia found to have an AKI and multiple metabolic derangements.  Prior to her current presentation she was hospitalized at [facility] from 12/4-12/8 for cellulitis.  Currently, she was found to have a  pericardial effusion on CT imaging which prompted a complete echocardiogram.  Echo demonstrated an asymmetric pericardial effusion with pockets that were larger than others that were in the small to moderate range on my independent review.  No evidence of cardiac tamponade.  LVEF is now mildly reduced.  In speaking with the patient she states that she has had chronic SOB, but no chest pain, syncope or presyncope.  She mostly just feels weak all over.  She has been taking warfarin for years due to prior DVT/PE.  To her or my knowledge she does not have any hypercoagulable state or recurrence clotting disorders, and states that she has maintained on warfarin  because she was told to.  No other complaints or concerns.   Past Medical History:  Diagnosis Date   Anxiety    takes Lorazepam  daily as needed   Arthritis    Cancer (HCC)    thyroid    Cataracts, bilateral    immature   Chronic back pain    compressed vertebrae,takes Fosamax weekly   Family history of adverse reaction to anesthesia    granddaughter gets very sick and mean    History of blood transfusion    no abnormal reaction noted   History of bronchitis 05/2015   History of colon polyps    benign   Hyperlipidemia    takes Zetia  daily   Hypertension    takes Diltiazem  and Avapro  daily   Hypothyroidism    takes Synthroid  daily   Ischemic colitis    hx of-many yrs ago   Joint pain    Joint swelling    OSA on CPAP    05-08-12 AHi was 46, RDI  53. titrated to   9 cm water   with 3 cm flex.-nadir 75%   PE (pulmonary embolism)    takes Coumadin  daily   Peripheral edema    Pneumonia 1992   hx of   Sarcoidosis    Shortness of breath    Urinary frequency    Weakness    left leg.Numbness and tingling.Has sciatica   Wheeze    occaionally. Not new    Past Surgical History:  Procedure Laterality Date   ABDOMINAL HYSTERECTOMY     APPENDECTOMY     BREAST BIOPSY     biopsies on right x 2   BREAST CYST ASPIRATION Left    CHOLECYSTECTOMY N/A 11/29/2015   Procedure: LAPAROSCOPIC CHOLECYSTECTOMY;  Surgeon: Jina Nephew, MD;  Location: [facility] OR;  Service: General;  Laterality: N/A;   COLONOSCOPY     DILATION AND CURETTAGE OF UTERUS     LIVER BIOPSY  MELANOMA EXCISION     removed from left leg   scalene surgery  1972   trying to find out what was wrong with her lungs   THYROID  SURGERY       Home Medications:  Prior to Admission medications  Medication Sig Start Date End Date Taking? Authorizing Provider  acetaminophen  (TYLENOL ) 325 MG tablet Take 325-650 mg by mouth 2 (two) times daily as needed for mild pain or headache.   Yes [provider]  Carboxymethylcellulose Sodium (REFRESH LIQUIGEL OP) Place 1 drop into both eyes as needed (Dry eye).   Yes [provider]  Cyanocobalamin (B-12 PO) Take 1 tablet by mouth daily.   Yes [provider]  diltiazem  (TIAZAC ) 360 MG 24 hr capsule Take 360 mg by mouth at bedtime.   Yes [provider]  furosemide  (LASIX ) 40 MG tablet Take 40 mg by mouth daily.   Yes [provider]  irbesartan  (AVAPRO ) 75 MG tablet Take 1 tablet (75 mg total) by mouth daily. 05/16/24  Yes Gonfa, Taye T, MD  levothyroxine  (SYNTHROID ) 112 MCG tablet Take 112 mcg by mouth every morning.   Yes [provider]  LORazepam  (ATIVAN ) 1 MG tablet Take 1.5 mg by mouth See admin instructions. Take 1.5 mg by mouth at bedtime and an additional 1-1.5mg  once a day as needed for anxiety   Yes [provider]  nebivolol  (BYSTOLIC ) 5 MG tablet Take 1 tablet (5 mg total) by mouth daily. 09/27/22  Yes Sheikh, Omair Latif, DO  nystatin  cream (MYCOSTATIN ) Apply 1 Application topically 2 (two) times daily as needed for dry skin. 06/07/24  Yes [provider]  senna-docusate (SENOKOT-S) 8.6-50 MG tablet Take 1-2 tablets by mouth 2 (two) times daily between meals as needed for mild constipation or moderate constipation. 05/16/24  Yes Gonfa, Taye T, MD  SIMPLY SALINE NA Place 1 spray into both nostrils as needed (for dryness).   Yes [provider]  triamcinolone cream (KENALOG) 0.1 % Apply 1 Application topically 2 (two) times daily as needed (Irritation). 04/05/21  Yes [provider]  warfarin (COUMADIN ) 5 MG tablet Take 5 mg by mouth at bedtime. 04/25/15  Yes [provider]    Scheduled Meds:  Chlorhexidine  Gluconate Cloth  6 each Topical Daily   insulin  aspart  0-15 Units Subcutaneous Q4H   levothyroxine   112 mcg Oral Q0600   sodium zirconium cyclosilicate   10 g Oral BID   Continuous Infusions:  ceFEPime  (MAXIPIME ) IV     [START ON 06/12/2024] linezolid  (ZYVOX ) IV     sodium bicarbonate  150 mEq in sterile water  1,150 mL infusion 100 mL/hr at 06/11/24 0749   PRN Meds: acetaminophen , HYDROmorphone  (DILAUDID ) injection, ondansetron  (ZOFRAN ) IV, mouth rinse, oxyCODONE , polyethylene glycol, senna  Allergies:   Allergies[1]  Social History:   Social History   Socioeconomic History   Marital status: Married    Spouse name: Not on file    Number of children: Not on file   Years of education: Not on file   Highest education level: Not on file  Occupational History   Occupation: Retired    Associate Professor: RETIRED  Tobacco Use   Smoking status: Never   Smokeless tobacco: Never  Vaping Use   Vaping status: Never Used  Substance and Sexual Activity   Alcohol  use: No   Drug use: No   Sexual activity: Not on file  Other Topics Concern   Not on file  Social History Narrative   Caffeine:  8 oz soda per day   Social Drivers of Health   Tobacco Use: Low Risk (06/10/2024)   Patient History    Smoking Tobacco Use: Never    Smokeless Tobacco Use: Never    Passive Exposure: Not on file  Financial Resource Strain: Not on file  Food Insecurity: No Food Insecurity (06/10/2024)   Epic    Worried About Programme Researcher, Broadcasting/film/video in the Last Year: Never true    Ran Out of Food in the Last Year: Never true  Transportation Needs: No Transportation Needs (06/10/2024)   Epic    Lack of Transportation (Medical): No    Lack of Transportation (Non-Medical): No  Physical Activity: Not on file  Stress: Not on file  Social Connections: Moderately Integrated (06/10/2024)   Social Connection and Isolation Panel    Frequency of Communication with Friends and Family: More than three times a week    Frequency of Social Gatherings with Friends and Family: More than three times a week    Attends Religious Services: 1 to 4 times per year    Active Member of [employer] or Organizations: No    Attends Banker Meetings: Never    Marital Status: Married  Catering Manager Violence: Not At Risk (06/10/2024)   Epic    Fear of Current or Ex-Partner: No    Emotionally Abused: No    Physically Abused: No    Sexually Abused: No  Depression (PHQ2-9): Low Risk (05/27/2024)   Depression (PHQ2-9)    PHQ-2 Score: 0  Alcohol  Screen: Not on file  Housing: Low Risk (06/10/2024)   Epic    Unable to Pay for Housing in the Last Year: No    Number of Times Moved in the  Last Year: 0    Homeless in the Last Year: No  Utilities: Not At Risk (06/10/2024)   Epic    Threatened with loss of utilities: No  Health Literacy: Not on file    Family History:    Family History  Problem Relation Age of Onset   Breast cancer Mother    CVA Mother    Breast cancer Sister    Breast cancer Sister    Heart attack Neg Hx    Sleep apnea Neg Hx      ROS:  Please see the history of present illness.   All other ROS reviewed and negative.     Physical Exam/Data: Vitals:   06/11/24 0730 06/11/24 0745 06/11/24 0800 06/11/24 0900  BP:   (!) 114/57 (!) 106/57  Pulse: (!) 103 96 (!) 102 (!) 112  Resp: 15 18 (!) 22 (!) 25  Temp:  97.6 F (36.4 C)    TempSrc:  Oral    SpO2: 95% 94% 94% 94%  Weight:      Height:        Intake/Output Summary (Last 24 hours) at 06/11/2024 0956 Last data filed at 06/11/2024 0749 Gross per 24 hour  Intake 3771.5 ml  Output 300 ml  Net 3471.5 ml      06/11/2024    5:00 AM 06/10/2024    3:21 PM 06/10/2024    6:55 AM  Last 3 Weights  Weight (lbs) 211 lb 6.7 oz 231 lb 0.7 oz 240 lb 15.4 oz  Weight (kg) 95.9 kg 104.8 kg 109.3 kg     Body mass index is 36.29 kg/m.  General:  Well nourished, well developed, in no acute distress HEENT: normal Neck: no JVD Vascular: No carotid bruits; Distal  pulses 2+ bilaterally Cardiac:  normal S1, S2; tachycardic with irregularly irregular rhythm; no murmur, rubs or gallops Lungs:  clear to auscultation bilaterally, no wheezing, rhonchi or rales  Abd: soft, nontender, no hepatomegaly  Ext: no edema, warm to touch with overlying tenderness to palpation and erythema Musculoskeletal:  No deformities, BUE and BLE strength normal and equal Skin: warm and dry  Neuro:  CNs 2-12 intact, no focal abnormalities noted Psych:  Normal affect   EKG:  The EKG was personally reviewed and demonstrates: Rate controlled atrial fibrillation, low voltage Telemetry:  Telemetry was personally reviewed and demonstrates:  Atrial fibrillation with RVR  Relevant CV Studies:  TTE 06/10/24:  IMPRESSIONS     1. Left ventricular ejection fraction, by estimation, is 45 to 50%. The  left ventricle has mildly decreased function. The left ventricle has no  regional wall motion abnormalities. Left ventricular diastolic parameters  are indeterminate.   2. Right ventricular systolic function is mildly reduced. The right  ventricular size is normal. There is normal pulmonary artery systolic  pressure. The estimated right ventricular systolic pressure is 25.3 mmHg.   3. Pericardial effusion measures 2.19cm at short axis pap level and .77cm  around the RV. Primarily posterior. No RA or RV collapse. Large  pericardial effusion. There is no evidence of cardiac tamponade.   4. The mitral valve is normal in structure. Trivial mitral valve  regurgitation. No evidence of mitral stenosis.   5. The aortic valve is tricuspid. Aortic valve regurgitation is not  visualized. No aortic stenosis is present.   6. The inferior vena cava is dilated in size with <50% respiratory  variability, suggesting right atrial pressure of 15 mmHg.   Laboratory Data: High Sensitivity Troponin:  No results for input(s): TROPONINIHS in the last 720 hours.  Recent Labs  Lab 05/12/24 1548 05/12/24 1758 06/10/24 0824 06/10/24 0959  TRNPT 22* 21* 39* 33*      Chemistry Recent Labs  Lab 06/10/24 2342 06/11/24 0244 06/11/24 0809  NA 126* 126* 127*  K 5.0 5.3* 4.7  CL 91* 91* 91*  CO2 20* 20* 23  GLUCOSE 111* 82 80  BUN 121* 120* 116*  CREATININE 3.85* 3.58* 3.00*  CALCIUM  9.1 9.1 8.6*  MG  --  3.0*  --   GFRNONAA 11* 12* 15*  ANIONGAP 16* 15 14    Recent Labs  Lab 06/10/24 0824  PROT 8.2*  ALBUMIN 3.1*  AST 33  ALT 29  ALKPHOS 160*  BILITOT 0.5   Lipids No results for input(s): CHOL, TRIG, HDL, LABVLDL, LDLCALC, CHOLHDL in the last 168 hours.  Hematology Recent Labs  Lab 06/10/24 0824 06/11/24 0440  WBC  9.6 14.6*  RBC 4.84 3.21*  HGB 14.2 9.3*  HCT 42.8 27.1*  MCV 88.4 84.4  MCH 29.3 29.0  MCHC 33.2 34.3  RDW 15.6* 15.1  PLT 240 241   Thyroid   Recent Labs  Lab 06/11/24 0244  TSH 9.530*    BNP Recent Labs  Lab 06/10/24 0824  PROBNP 6,454.0*    DDimer No results for input(s): DDIMER in the last 168 hours.  Radiology/Studies:  ECHOCARDIOGRAM COMPLETE Result Date: 06/10/2024    ECHOCARDIOGRAM REPORT   Patient Name:   KYLER LERETTE Vandeventer Date of Exam: 06/10/2024 Medical Rec #:  [id number]            Height:       64.0 in Accession #:    [id number]  Weight:       241.0 lb Date of Birth:  01-Nov-1940            BSA:          2.118 m Patient Age:    83 years             BP:           91/65 mmHg Patient Gender: F                    HR:           101 bpm. Exam Location:  Inpatient Procedure: 2D Echo, Cardiac Doppler and Color Doppler (Both Spectral and Color            Flow Doppler were utilized during procedure). REPORT CONTAINS CRITICAL RESULT Indications:    I31.3 Pericardial effusion (noninflammatory)  History:        Patient has prior history of Echocardiogram examinations, most                 recent 05/13/2024. Abnormal ECG, Arrythmias:Atrial Fibrillation,                 Signs/Symptoms:Bacteremia, Chest Pain, Shortness of Breath and                 Dyspnea; Risk Factors:Sleep Apnea and Hypertension. Pulmonary                 embolus.  Sonographer:    Ellouise Mose RDCS Referring Phys: [id number] KEVIN STEINL  Sonographer Comments: Patient is obese and Technically difficult study due to poor echo windows. Image acquisition challenging due to patient body habitus. Critical care team evaluating patient during exam, patient conversing with them. IMPRESSIONS  1. Left ventricular ejection fraction, by estimation, is 45 to 50%. The left ventricle has mildly decreased function. The left ventricle has no regional wall motion abnormalities. Left ventricular diastolic parameters are indeterminate.  2.  Right ventricular systolic function is mildly reduced. The right ventricular size is normal. There is normal pulmonary artery systolic pressure. The estimated right ventricular systolic pressure is 25.3 mmHg.  3. Pericardial effusion measures 2.19cm at short axis pap level and .77cm around the RV. Primarily posterior. No RA or RV collapse. Large pericardial effusion. There is no evidence of cardiac tamponade.  4. The mitral valve is normal in structure. Trivial mitral valve regurgitation. No evidence of mitral stenosis.  5. The aortic valve is tricuspid. Aortic valve regurgitation is not visualized. No aortic stenosis is present.  6. The inferior vena cava is dilated in size with <50% respiratory variability, suggesting right atrial pressure of 15 mmHg. FINDINGS  Left Ventricle: Left ventricular ejection fraction, by estimation, is 45 to 50%. The left ventricle has mildly decreased function. The left ventricle has no regional wall motion abnormalities. The left ventricular internal cavity size was normal in size. There is no left ventricular hypertrophy. Left ventricular diastolic parameters are indeterminate. Right Ventricle: The right ventricular size is normal. No increase in right ventricular wall thickness. Right ventricular systolic function is mildly reduced. There is normal pulmonary artery systolic pressure. The tricuspid regurgitant velocity is 2.36 m/s, and with an assumed right atrial pressure of 3 mmHg, the estimated right ventricular systolic pressure is 25.3 mmHg. Left Atrium: Left atrial size was normal in size. Right Atrium: Right atrial size was normal in size. Pericardium: Pericardial effusion measures 2.19cm at short axis pap level and .77cm around the RV. Primarily posterior. No RA or  RV collapse. A large pericardial effusion is present. There is no evidence of cardiac tamponade. Presence of epicardial fat layer. Mitral Valve: The mitral valve is normal in structure. Trivial mitral valve  regurgitation. No evidence of mitral valve stenosis. Tricuspid Valve: The tricuspid valve is normal in structure. Tricuspid valve regurgitation is mild . No evidence of tricuspid stenosis. Aortic Valve: The aortic valve is tricuspid. Aortic valve regurgitation is not visualized. No aortic stenosis is present. Pulmonic Valve: The pulmonic valve was normal in structure. Pulmonic valve regurgitation is not visualized. No evidence of pulmonic stenosis. Aorta: The aortic root and ascending aorta are structurally normal, with no evidence of dilitation. Venous: The inferior vena cava is dilated in size with less than 50% respiratory variability, suggesting right atrial pressure of 15 mmHg. IAS/Shunts: No atrial level shunt detected by color flow Doppler.  LEFT VENTRICLE PLAX 2D LVIDd:         3.80 cm LVIDs:         3.10 cm LV PW:         1.70 cm LV IVS:        1.00 cm LVOT diam:     2.10 cm LV SV:         61 LV SV Index:   29 LVOT Area:     3.46 cm  LV Volumes (MOD) LV vol d, MOD A4C: 80.1 ml LV vol s, MOD A4C: 39.3 ml LV SV MOD A4C:     80.1 ml RIGHT VENTRICLE RV S prime:     10.10 cm/s TAPSE (M-mode): 1.3 cm LEFT ATRIUM           Index        RIGHT ATRIUM           Index LA diam:      3.50 cm 1.65 cm/m   RA Area:     19.60 cm LA Vol (A2C): 22.2 ml 10.48 ml/m  RA Volume:   49.60 ml  23.42 ml/m LA Vol (A4C): 20.0 ml 9.44 ml/m  AORTIC VALVE LVOT Vmax:   106.10 cm/s LVOT Vmean:  66.300 cm/s LVOT VTI:    0.178 m  AORTA Ao Root diam: 3.20 cm Ao Asc diam:  3.40 cm MITRAL VALVE               TRICUSPID VALVE MV Area (PHT): 4.31 cm    TR Peak grad:   22.3 mmHg MV Decel Time: 176 msec    TR Vmax:        236.00 cm/s MV E velocity: 84.50 cm/s                            SHUNTS                            Systemic VTI:  0.18 m                            Systemic Diam: 2.10 cm Morene Brownie Electronically signed by Morene Brownie Signature Date/Time: 06/10/2024/2:04:39 PM    Final    CT CHEST ABDOMEN PELVIS WO CONTRAST Result  Date: 06/10/2024 CLINICAL DATA:  Generalized weakness for 2 weeks.  Sepsis. EXAM: CT CHEST, ABDOMEN AND PELVIS WITHOUT CONTRAST TECHNIQUE: Multidetector CT imaging of the chest, abdomen and pelvis was performed following the standard protocol without IV contrast. RADIATION DOSE REDUCTION: This  exam was performed according to the departmental dose-optimization program which includes automated exposure control, adjustment of the mA and/or kV according to patient size and/or use of iterative reconstruction technique. COMPARISON:  May 12, 2024 FINDINGS: CT CHEST FINDINGS Cardiovascular: Aortic atherosclerosis. No evidence of thoracic aortic aneurysm. Mild cardiomegaly. Coronary artery calcifications are noted. Moderate pericardial effusion or hemopericardium is now noted. Mediastinum/Nodes: Stable left thyroid  nodule which has been previously assessed on ultrasound. Esophagus is unremarkable. Calcified mediastinal lymph nodes are noted suggesting prior granulomatous disease. Lungs/Pleura: No pneumothorax or pleural effusion is noted. Left lingular and left lower lobe airspace opacities are noted with bronchograms concerning for possible pneumonia. Musculoskeletal: No chest wall mass or suspicious bone lesions identified. CT ABDOMEN PELVIS FINDINGS Hepatobiliary: No focal liver abnormality is seen. Status post cholecystectomy. No biliary dilatation. Pancreas: Unremarkable. No pancreatic ductal dilatation or surrounding inflammatory changes. Spleen: Normal in size without focal abnormality. Adrenals/Urinary Tract: Adrenal glands are unremarkable. Kidneys are normal, without renal calculi, focal lesion, or hydronephrosis. Bladder is unremarkable. Stomach/Bowel: Stomach is unremarkable. Status post appendectomy. Sigmoid diverticulosis without inflammation. No evidence of bowel obstruction. Vascular/Lymphatic: Aortic atherosclerosis. Bilateral inguinal adenopathy is noted. Left inguinal lymph node had been recently  biopsied and correlation with pathology results is recommended. Reproductive: Status post hysterectomy. No adnexal masses. Other: Moderate size fat containing periumbilical hernia. No ascites. Musculoskeletal: No acute or significant osseous findings. IMPRESSION: 1. Moderate pericardial effusion or hemopericardium is now noted. 2. Left lingular and left lower lobe airspace opacities are noted with bronchograms concerning for pneumonia. 3. Bilateral inguinal adenopathy is noted. Left inguinal lymph node had been recently biopsied and correlation with pathology results is recommended. 4. Sigmoid diverticulosis without inflammation. 5. Moderate size fat containing periumbilical hernia. 6. Aortic atherosclerosis. Aortic Atherosclerosis (ICD10-I70.0). Electronically Signed   By: Lynwood Landy Raddle M.D.   On: 06/10/2024 11:54   DG Chest Port 1 View Result Date: 06/10/2024 EXAM: 1 VIEW(S) XRAY OF THE CHEST 06/10/2024 07:50:00 AM COMPARISON: 05/12/2024 CLINICAL HISTORY: weakness FINDINGS: LUNGS AND PLEURA: Calcified granuloma of right costophrenic angle. Small to moderate left sided pleural effusion. Increasing left basilar opacities which may reflect atelectasis versus infection. Prominent central pulmonary vasculature without pulmonary edema. No pneumothorax. HEART AND MEDIASTINUM: Cardiomegaly. Atherosclerotic plaque. BONES AND SOFT TISSUES: No acute osseous abnormality. IMPRESSION: 1. Small to moderate left pleural effusion with increasing left basilar opacities, which may reflect atelectasis or infection. 2. Cardiomegaly. Electronically signed by: Donnice Mania MD 06/10/2024 08:56 AM EST RP Workstation: [id number]     Assessment and Plan:  [name] is a 84 y.o. female with a hx of longstanding persistent AF (on warfarin), PE/DVT, HFpEF, HTN, HLD, new DM2, sarcoid, OSA and obesity who is being seen 06/11/2024 for the evaluation of pericardial effusion at the request of Dr. Shelah.  #Moderate-Large  Pericardial Effusion #New HFmrEF :: Patient presented with progressive weakness for 2 weeks found to be hypotensive.  She was treated with IV fluids with relative improvement in her blood pressures; however, her EF is now mildly reduced, BNP is elevated, and volume status is hard to gather.  Her IVC on TTE was dilated and not collapsible suggesting increased intracardiac pressures.  On my examination of her, she does not appear to be volume overloaded; however, I agree that it is hard to tell.  Further, her new pericardial effusion would also suggest a volume overloaded state.  I recommend that we hold steady on administering additional IV fluids or diuresing at this time given that she  has now stabilized and can reassess the need for diuresis in the near future.  In terms of her pericardial effusion, no clear etiology identified, but thankfully no signs/symptoms of cardiac tamponade.  I think this will just need to be monitored moving forward. Continue to monitor, but no concern for tamponade at this time Will eventually need beta-blocker and ACE/ARB, but want to hold off for now as she recovers from her acute illness Close hold the course today for additional IV fluids or diuresis to allow her to equilibrate Will consider possible diuresis tomorrow depending on how she looks Strict I's and O's Will check ESR/CRP tomorrow  #Longstanding Persistent AF with RVR # Supratherapeutic INR :: Labeled as permanent atrial fibrillation in the chart, but I do not believe the patient has ever tried rhythm control at her request.  Regardless she has been in atrial fibrillation for a long time and this is chronic.  Her heart rates were well-controlled initially, but now she is in RVR.  Will hold off on starting beta-blocker at this time given that her rates are not very fast and that her tachycardia may be compensatory for possible sepsis.  Long-term, I am not sure that she should remain on warfarin for primary  prevention of CVA, but rather a DOAC if affordable.  Her INR was supratherapeutic on admission and this can be entirely avoided with a DOAC. Maintain telemetry Allowing low-level tachycardia for now as initiating a beta-blocker may cause her hypotension to recur We will reassess tomorrow to see if it is safe to  start metoprolol  Ideally would transition the patient to a DOAC prior to discharge instead of warfarin to avoid having another supratherapeutic INR.  Unless me or the patient are missing something, I do not see a strong reason for her to not be able to be on a DOAC.  #Hypotension #Possible Sepsis Per primary   Risk Assessment/Risk Scores:       [place]  Heart Association (NYHA) Functional Class NYHA Class III  CHA2DS2-VASc Score = 5   This indicates a 7.2% annual risk of stroke. The patient's score is based upon: CHF History: 0 HTN History: 1 Diabetes History: 0 Stroke History: 0 Vascular Disease History: 1 Age Score: 2 Gender Score: 1      CRITICAL CARE Performed by: Georganna [place]   Total critical care time: 40 minutes minutes  Critical care time was exclusive of separately billable procedures and treating other patients.  Critical care was necessary to treat or prevent imminent or life-threatening deterioration.  Critical care was time spent personally by me on the following activities: development of treatment plan with patient and/or surrogate as well as nursing, discussions with consultants, evaluation of patient's response to treatment, examination of patient, obtaining history from patient or surrogate, ordering and performing treatments and interventions, ordering and review of laboratory studies, ordering and review of radiographic studies, pulse oximetry and re-evaluation of patient's condition.   For questions or updates, please contact [facility] Please consult [email or web address] for contact info under      Signed, Georganna Archer, MD   06/11/2024 9:56 AM      [1]  Allergies Allergen Reactions   Atenolol Other (See Comments)    Depression    Citalopram Hydrobromide Nausea Only and Other (See Comments)    Nausea/Fatigue   Clindamycin /Lincomycin Other (See Comments)    Via IV, has caused a metallic taste in the mouth    Codeine Other (See Comments)    Caused Hyperactivity  and Dysphoria   Coenzyme Q10 Itching   Erythromycin Itching, Anxiety and Other (See Comments)    Also with jitters   Motrin [Ibuprofen] Other (See Comments)    Is to not take this   Statins Other (See Comments)    Leg muscle weakness w/ rosuvastatin and simvastatin    Latex Rash   Losartan Potassium Other (See Comments)    Muscle aches    "

## 2024-06-11 NOTE — Plan of Care (Signed)
  Problem: Education: Goal: Knowledge of General Education information will improve Description: Including pain rating scale, medication(s)/side effects and non-pharmacologic comfort measures Outcome: Progressing   Problem: Health Behavior/Discharge Planning: Goal: Ability to manage health-related needs will improve Outcome: Progressing   Problem: Clinical Measurements: Goal: Ability to maintain clinical measurements within normal limits will improve Outcome: Progressing Goal: Diagnostic test results will improve Outcome: Progressing Goal: Respiratory complications will improve Outcome: Progressing Goal: Cardiovascular complication will be avoided Outcome: Progressing   Problem: Nutrition: Goal: Adequate nutrition will be maintained Outcome: Progressing   Problem: Coping: Goal: Level of anxiety will decrease Outcome: Progressing   

## 2024-06-11 NOTE — Progress Notes (Signed)
 PHARMACY - PHYSICIAN COMMUNICATION CRITICAL VALUE ALERT - BLOOD CULTURE IDENTIFICATION (BCID)  [name] is an 84 y.o. female who presented to [facility] on 06/10/2024 with a chief complaint of  Chief Complaint  Patient presents with   Weakness    BIBA from home w/ c/o generalized weakness x2 weeks.  States patient seen at [facility] 2 weeks ago and told some issue with lymph nodes, had biopsy, no results or unsure of results.  Was seeing wound care for BLE lymphedema, but no longer receiving.  Per EMS, hypotensive en route, 76/30, Afib HR 40-60, 92% RA and RR 30. 20g placed to Left AC and received approx 300 ml LR.     Assessment:   Patient was hypotensive at presentation and etiology not entirely clear. Concern for left pneumonia and also cellulitis of both legs. 1/2 BCx: 1/3 bottles GPC Staph epi and is MecA positive - possible contamination but also have to consider LE wounds 1/2 MRSA PCR: not detected  Name of physician (or Provider) Contacted: [place]  Current antibiotics: Cefepime  and linezolid   Changes to prescribed antibiotics recommended:  Stop Cefepime  and continue linezolid  600 mg IV every 12 hours  Results for orders placed or performed during the hospital encounter of 06/10/24  Blood Culture ID Panel (Reflexed) (Collected: 06/10/2024  8:17 AM)  Result Value Ref Range   Enterococcus faecalis NOT DETECTED NOT DETECTED   Enterococcus Faecium NOT DETECTED NOT DETECTED   Listeria monocytogenes NOT DETECTED NOT DETECTED   Staphylococcus species DETECTED (A) NOT DETECTED   Staphylococcus aureus (BCID) NOT DETECTED NOT DETECTED   Staphylococcus epidermidis DETECTED (A) NOT DETECTED   Staphylococcus lugdunensis NOT DETECTED NOT DETECTED   Streptococcus species NOT DETECTED NOT DETECTED   Streptococcus agalactiae NOT DETECTED NOT DETECTED   Streptococcus pneumoniae NOT DETECTED NOT DETECTED   Streptococcus pyogenes NOT DETECTED NOT DETECTED   A.calcoaceticus-baumannii NOT  DETECTED NOT DETECTED   Bacteroides fragilis NOT DETECTED NOT DETECTED   Enterobacterales NOT DETECTED NOT DETECTED   Enterobacter cloacae complex NOT DETECTED NOT DETECTED   Escherichia coli NOT DETECTED NOT DETECTED   Klebsiella aerogenes NOT DETECTED NOT DETECTED   Klebsiella oxytoca NOT DETECTED NOT DETECTED   Klebsiella pneumoniae NOT DETECTED NOT DETECTED   Proteus species NOT DETECTED NOT DETECTED   Salmonella species NOT DETECTED NOT DETECTED   Serratia marcescens NOT DETECTED NOT DETECTED   Haemophilus influenzae NOT DETECTED NOT DETECTED   Neisseria meningitidis NOT DETECTED NOT DETECTED   Pseudomonas aeruginosa NOT DETECTED NOT DETECTED   Stenotrophomonas maltophilia NOT DETECTED NOT DETECTED   Candida albicans NOT DETECTED NOT DETECTED   Candida auris NOT DETECTED NOT DETECTED   Candida glabrata NOT DETECTED NOT DETECTED   Candida krusei NOT DETECTED NOT DETECTED   Candida parapsilosis NOT DETECTED NOT DETECTED   Candida tropicalis NOT DETECTED NOT DETECTED   Cryptococcus neoformans/gattii NOT DETECTED NOT DETECTED   Methicillin resistance mecA/C DETECTED (A) NOT DETECTED    Eleanor Agent, PharmD, BCPS 06/11/2024  9:56 AM

## 2024-06-12 DIAGNOSIS — I4891 Unspecified atrial fibrillation: Secondary | ICD-10-CM | POA: Diagnosis not present

## 2024-06-12 DIAGNOSIS — I3139 Other pericardial effusion (noninflammatory): Secondary | ICD-10-CM | POA: Diagnosis not present

## 2024-06-12 LAB — GLUCOSE, CAPILLARY
Glucose-Capillary: 81 mg/dL (ref 70–99)
Glucose-Capillary: 161 mg/dL — ABNORMAL HIGH (ref 70–99)

## 2024-06-12 LAB — BASIC METABOLIC PANEL WITH GFR
Anion gap: 12 (ref 5–15)
BUN: 82 mg/dL — ABNORMAL HIGH (ref 8–23)
CO2: 26 mmol/L (ref 22–32)
Calcium: 8.3 mg/dL — ABNORMAL LOW (ref 8.9–10.3)
Chloride: 94 mmol/L — ABNORMAL LOW (ref 98–111)
Creatinine, Ser: 1.7 mg/dL — ABNORMAL HIGH (ref 0.44–1.00)
GFR, Estimated: 29 mL/min — ABNORMAL LOW
Glucose, Bld: 84 mg/dL (ref 70–99)
Potassium: 3.8 mmol/L (ref 3.5–5.1)
Sodium: 132 mmol/L — ABNORMAL LOW (ref 135–145)

## 2024-06-12 LAB — CBC
HCT: 29.6 % — ABNORMAL LOW (ref 36.0–46.0)
Hemoglobin: 9.9 g/dL — ABNORMAL LOW (ref 12.0–15.0)
MCH: 29 pg (ref 26.0–34.0)
MCHC: 33.4 g/dL (ref 30.0–36.0)
MCV: 86.8 fL (ref 80.0–100.0)
Platelets: 244 K/uL (ref 150–400)
RBC: 3.41 MIL/uL — ABNORMAL LOW (ref 3.87–5.11)
RDW: 15.3 % (ref 11.5–15.5)
WBC: 11.7 K/uL — ABNORMAL HIGH (ref 4.0–10.5)
nRBC: 0 % (ref 0.0–0.2)

## 2024-06-12 LAB — SEDIMENTATION RATE: Sed Rate: 106 mm/h — ABNORMAL HIGH (ref 0–22)

## 2024-06-12 LAB — C-REACTIVE PROTEIN: CRP: 15.7 mg/dL — ABNORMAL HIGH

## 2024-06-12 LAB — LEGIONELLA PNEUMOPHILA SEROGP 1 UR AG: L. pneumophila Serogp 1 Ur Ag: NEGATIVE

## 2024-06-12 LAB — PHOSPHORUS: Phosphorus: 3.9 mg/dL (ref 2.5–4.6)

## 2024-06-12 LAB — MAGNESIUM: Magnesium: 2.7 mg/dL — ABNORMAL HIGH (ref 1.7–2.4)

## 2024-06-12 MED ORDER — CARVEDILOL 3.125 MG PO TABS
3.1250 mg | ORAL_TABLET | Freq: Two times a day (BID) | ORAL | Status: DC
  Administered 2024-06-12 – 2024-06-20 (×10): 3.125 mg via ORAL
  Filled 2024-06-12 (×11): qty 1

## 2024-06-12 NOTE — Progress Notes (Signed)
 [facility] Progress Note  Subjective:  Seen in room C/o leg pains and dressing changes Creat down 1.7 Good UOP 1.5 L yesterday Na+ up to 132  Presentation summary: 84 y.o. female with a history of anxiety, HTN, OSA, and sarcoidosis who presented to the hospital for evaluation of weakness.  She has also recently had decreased PO intake and decreased urine output.  She states she visited an ER in December for chest pain and hasn't felt well for a while. Here she was mildly hypotensive.  They state that her blood pressure is normally high and recently at an appointment her blood pressure medication was reduced.  Home meds include lasix  and ARB and nebivolol .  She was found to have AKI and hyperkalemia with K of 7.0.  covid, flu, and RSV are negative.  Nephrology is consulted for assistance with management of the same.  She was given albuterol  in the ER as well as insulin /dextrose , bicarb 2 amps, lokelma , and calcium .  She also got 1.5 liters NS and an LR bolus.  We initiated bicarb gtt.  With regard to her creatinine, Cr 4.67 on 1/2 on presentation.  Repeat labs with Cr 4.11.  Note that she has normal Cr usually (less than 1).  For reference, on 05/27/24 Cr 1.25 and K 5.4.  Labs were obtained when she had seen Dr. Federico for evaluation of lymphadenopathy.     Vitals:   06/12/24 0400 06/12/24 0500 06/12/24 0600 06/12/24 0835  BP: 112/69 106/73 132/81   Pulse: (!) 106 (!) 106 98   Resp: 18 18 12    Temp: 97.8 F (36.6 C)   97.8 F (36.6 C)  TempSrc: Oral   Oral  SpO2: 96% 96% 96%   Weight:      Height:        Exam: General: elderly pt , no distress Heart: S1S2 no rub  Lungs: clear to auscultation; normal wob, on [place] O2 Abdomen: soft/nt/nd  Extremities: no edema; no cyanosis or clubbing   Neuro:  Ox 3, responsive  UA 06/10/24 -> prot 30, neg nit/ Hb CT abd/ pelv/ chest w/o contrast =-> kidneys are normal, without renal calculi, focal lesion, or hydronephrosis. Bladder is  unremarkable BP's here occ low in 90s, 100-120's day #1 Today day #2 bp's 90s-100s mostly , occ 110s +blood cx's --> GPC, staph species    Assessment/ Plan:  # AKI  - b/l creatinine 0.8- 1.0 from nov-dec 2025 - creat 4.6 due to pre-renal insults from low BP's + prolonged poor PO intake - UA w/ min prot, no rbc/ wbcs - CT ->  no hydronephrosis - holding irbesartan  - looks less dehydrated - creat down to 1.7 today - suggest cont IVFs for another 1-2 days then taper off if po intake is good - no other suggestions -> will sign off    # Hyperkalemia - Secondary to AKI as well as the setting of ARB use - K 7.0 initially, now resolved in 4-5 range - cont to avoid ARB/ acei/ nsaids for 1-2 months   # Hyponatremia  - hypovolemia looks to be mostly resolved  - Na 132 today   # Hypotension - Note hx of HTN  - hold irbesartan  for 1-2 months - can resume nebivolol  if bp's go high   # Metabolic acidosis - resolved        Myer Fret MD  [facility] 06/12/2024, 9:19 AM  Recent Labs  Lab 06/10/24 0824 06/10/24 1436 06/11/24 0244 06/11/24 0440 06/11/24  0809 06/11/24 1905 06/12/24 0238  HGB 14.2  --   --  9.3*  --   --  9.9*  ALBUMIN 3.1*  --   --   --   --   --   --   CALCIUM  9.1   < > 9.1  --    < > 8.4* 8.3*  PHOS  --   --  6.0*  --   --   --  3.9  CREATININE 4.67*   < > 3.58*  --    < > 2.19* 1.70*  K 7.0*   < > 5.3*  --    < > 4.0 3.8   < > = values in this interval not displayed.   No results for input(s): IRON, TIBC, FERRITIN in the last 168 hours. Inpatient medications:  carvedilol   3.125 mg Oral BID WC   Chlorhexidine  Gluconate Cloth  6 each Topical Daily   insulin  aspart  0-15 Units Subcutaneous Q4H   levothyroxine   112 mcg Oral Q0600   nystatin    Topical BID    sodium chloride  50 mL/hr at 06/12/24 0917   linezolid  (ZYVOX ) IV     acetaminophen , HYDROmorphone  (DILAUDID ) injection, ondansetron  (ZOFRAN ) IV, mouth rinse, oxyCODONE , polyethylene glycol,  senna

## 2024-06-12 NOTE — Progress Notes (Addendum)
 "  NAME:  [name], MRN:  [id number], DOB:  [birth year], LOS: 2 ADMISSION DATE:  06/10/2024, CONSULTATION DATE:  1/2 REFERRING MD:  Dr Bernard, CHIEF COMPLAINT:  Hypotension  History of Present Illness:  84 year old female with PMH as below, which is significant for Atrial fibrillation and PE on warfarin, OSA on CPAP, sarcoidosis, HTN, thyroid  cancer, and HFpEF. She has recently been treated for cellulitis of both legs and admitted for atypical chest pain and AF RVR. She was discharged on lasix , doxycyline. Course also complicated by diffuse lymphadenopathy for which she has since had a biopsy and followed up with oncology for what is no longer felt to be a malignant process. She is now presenting to [facility] ED 1/2 with complaints of weakness. Unable to move at home so husband called EMS. En route to the ED she was noted to be hypotensive with systolic blood pressures in the 70s. Responded to IVF. Laboratory evaluation significant for renal failure and hyperkalemia and well as INR > 10. K was temporized in the ED. CT abdomen pelvis showed a moderate pericardial effusion vs hemopericardium and concern for left lower lobe pneumonia. PCCM asked to evaluate for hypotension and pericardial effusion.   Pertinent  Medical History   has a past medical history of Anxiety, Arthritis, Cancer (HCC), Cataracts, bilateral, Chronic back pain, Family history of adverse reaction to anesthesia, History of blood transfusion, History of bronchitis (05/2015), History of colon polyps, Hyperlipidemia, Hypertension, Hypothyroidism, Ischemic colitis, Joint pain, Joint swelling, OSA on CPAP, PE (pulmonary embolism), Peripheral edema, Pneumonia (1992), Sarcoidosis, Shortness of breath, Urinary frequency, Weakness, and Wheeze.   Significant Hospital Events: Including procedures, antibiotic start and stop dates in addition to other pertinent events   1/2 admit to ICU Blood culture 1/2 > 1 of 2 GPC, BC ID negative so  far 1/2 echo: LVEF 45-50%, RV systolic function mildly reduced. RV size normal. Large pericardial effusion, no collapse of RV or RA. No concern for pericardial tamponade based on Echo.  1/2 CT abd/chest: Moderate pericardial effusion vs hemopericardium noted. (New from scan 12/4). Left lingular and left lower lobe opacities. Bilateral inguinal adenopathy.  Interim History / Subjective:   -Carvedilol  started this morning -Continued improvement renal function, SCr 1.7 - Hyperkalemia resolved -I/O+ 3.9 L total, UOP 1550 cc last 24 hours   Objective    Blood pressure 132/81, pulse 98, temperature 97.8 F (36.6 C), temperature source Oral, resp. rate 12, height 5' 4 (1.626 m), weight 95.9 kg, SpO2 96%.        Intake/Output Summary (Last 24 hours) at 06/12/2024 0902 Last data filed at 06/12/2024 9161 Gross per 24 hour  Intake 2082.81 ml  Output 1950 ml  Net 132.81 ml   Filed Weights   06/10/24 0655 06/10/24 1521 06/11/24 0500  Weight: 109.3 kg 104.8 kg 95.9 kg    Examination: General: Elderly woman, no distress, appears a bit more awake and stronger today HENT: No secretions, oropharynx clear, pupils equal Lungs: Decreased at both bases Cardiovascular: Irregularly irregular and distant, heart rate 110, no murmur Abdomen: Obese, nondistended with positive bowel sounds Extremities: Bilateral lower extremity pitting edema, anterior lower extremity wounds are wrapped Neuro: Awake and alert, answers questions, well-oriented, moves all extremities    Resolved problem list   Assessment and Plan   Hypotension at presentation: etiology not entirely clear. Likely hypovolemic considering recent poor intake and vomiting, but also BNP is elevated and she has edema so hard to get a true  sense of her volume status. BP did improve with volume resuscitation.  Consider severe sepsis. Lactic 1.8 -Antibiotics simplified to linezolid  on 1/3, 1 of 2 blood cultures GPC not yet speciated but BC ID  consistent with staph epi.  May not be a true pathogen - Gentle IV fluid resuscitation  Pericardial effusion: Etiology again unclear. Uremia, coagulopathy, volume may all be playing a role -Appreciate cardiology evaluation, no evidence tamponade physiology currently - Working to correct renal failure, uremia - Question whether there is any role for possible diagnostic pericardiocentesis at some point.  Her anticoagulation is still on hold.  AKI, presumed due to hypovolemia/ATN Hyperkalemia, improved - Bicarbonate infusion, plan to continue for now as we follow renal function for improvement - Should be able to discontinue bicarbonate infusion - Follow urine output, follow BMP  Warfarin coagulopathy - Received vitamin K  and FFP - No evidence of active bleeding - Repeat INR on 1/5 - Warfarin held at this time, restart when stable to do so  Hematochezia - Following clinically, following CBC.  Coagulopathy corrected  Atrial fibrillation - slow ventricular response. Rate now improved. HFrEF - unclear at this point if chronic or acute on chronic. - Appreciate cardiology evaluation -Carvedilol  started 1/4 - Diltiazem , furosemide , irbesartan , Nebivolol  all on hold for now pending normalization of her renal function  Lower extremity wounds: no clear signs of active infection, but consider cellulitis.  She has had pain - Appreciate WOC evaluation - No clear evidence of infection or cellulitis but remains on empiric linezolid  as above  OSA on CPAP - Continue CPAP nightly  Sarcoid - Supportive care  Lymphadenopathy -inguinal node biopsy negative 12/19, unlikely to be malignant per oncology.  - Supportive care  Hypothyroidism  - Elevated TSH - Continue current Synthroid  for now.  Likely need to recheck TSH once acute illness resolves.  Dose adjust Synthroid  if abnormal at that time     Labs   CBC: Recent Labs  Lab 06/10/24 0824 06/11/24 0440 06/12/24 0238  WBC 9.6 14.6*  11.7*  HGB 14.2 9.3* 9.9*  HCT 42.8 27.1* 29.6*  MCV 88.4 84.4 86.8  PLT 240 241 244    Basic Metabolic Panel: Recent Labs  Lab 06/10/24 2342 06/11/24 0244 06/11/24 0809 06/11/24 1905 06/12/24 0238  NA 126* 126* 127* 129* 132*  K 5.0 5.3* 4.7 4.0 3.8  CL 91* 91* 91* 91* 94*  CO2 20* 20* 23 26 26   GLUCOSE 111* 82 80 96 84  BUN 121* 120* 116* 103* 82*  CREATININE 3.85* 3.58* 3.00* 2.19* 1.70*  CALCIUM  9.1 9.1 8.6* 8.4* 8.3*  MG  --  3.0*  --   --  2.7*  PHOS  --  6.0*  --   --  3.9   GFR: Estimated Creatinine Clearance: 27.2 mL/min (A) (by C-G formula based on SCr of 1.7 mg/dL (H)). Recent Labs  Lab 06/10/24 0824 06/10/24 0825 06/11/24 0440 06/12/24 0238  WBC 9.6  --  14.6* 11.7*  LATICACIDVEN  --  1.8  --   --     Liver Function Tests: Recent Labs  Lab 06/10/24 0824  AST 33  ALT 29  ALKPHOS 160*  BILITOT 0.5  PROT 8.2*  ALBUMIN 3.1*   No results for input(s): LIPASE, AMYLASE in the last 168 hours. No results for input(s): AMMONIA in the last 168 hours.  ABG No results found for: PHART, PCO2ART, PO2ART, HCO3, TCO2, ACIDBASEDEF, O2SAT   Coagulation Profile: Recent Labs  Lab 06/10/24 0824 06/11/24 0809  INR >10.0* 2.1*    Cardiac Enzymes: No results for input(s): CKTOTAL, CKMB, CKMBINDEX, TROPONINI in the last 168 hours.  HbA1C: Hgb A1c MFr Bld  Date/Time Value Ref Range Status  06/10/2024 04:41 PM 7.6 (H) 4.8 - 5.6 % Final    Comment:    (NOTE) Diagnosis of Diabetes The following HbA1c ranges recommended by the American Diabetes Association (ADA) may be used as an aid in the diagnosis of diabetes mellitus.  Hemoglobin             Suggested A1C NGSP%              Diagnosis  <5.7                   Non Diabetic  5.7-6.4                Pre-Diabetic  >6.4                   Diabetic  <7.0                   Glycemic control for                       adults with diabetes.      CBG: Recent Labs  Lab  06/11/24 1203 06/11/24 1659 06/11/24 1958 06/11/24 2356 06/12/24 0336  GLUCAP 90 98 88 94 81    Critical care time:  NA    Lamar Chris, MD, PhD 06/12/2024, 9:02 AM [facility] [phone] or if no answer before 7:00PM call [phone] For any issues after 7:00PM please call eLink [phone]         "

## 2024-06-12 NOTE — Plan of Care (Signed)
" °  Problem: Clinical Measurements: Goal: Ability to maintain clinical measurements within normal limits will improve Outcome: Progressing Goal: Diagnostic test results will improve Outcome: Progressing Goal: Respiratory complications will improve Outcome: Progressing Goal: Cardiovascular complication will be avoided Outcome: Progressing   Problem: Nutrition: Goal: Adequate nutrition will be maintained Outcome: Progressing   Problem: Elimination: Goal: Will not experience complications related to urinary retention Outcome: Progressing   "

## 2024-06-12 NOTE — Progress Notes (Addendum)
 "  Rounding Note   Patient Name: [name] Date of Encounter: 06/12/2024  [facility] Cardiologist: Madonna Large, DO   Subjective - No acute events overnight - Patient says that she is starting to feel better overall she appears more spry today - I want to start the patient on metoprolol  but she states that she has had a negative reaction to it in the past causing mood disturbance.  I see this listed as an allergy for atenolol but not for metoprolol , but she insist that it was metoprolol . - No other concerns.  Scheduled Meds:  Chlorhexidine  Gluconate Cloth  6 each Topical Daily   insulin  aspart  0-15 Units Subcutaneous Q4H   levothyroxine   112 mcg Oral Q0600   nystatin    Topical BID   Continuous Infusions:  sodium chloride  75 mL/hr at 06/12/24 0340   linezolid  (ZYVOX ) IV     PRN Meds: acetaminophen , HYDROmorphone  (DILAUDID ) injection, ondansetron  (ZOFRAN ) IV, mouth rinse, oxyCODONE , polyethylene glycol, senna   Vital Signs  Vitals:   06/12/24 0300 06/12/24 0400 06/12/24 0500 06/12/24 0600  BP: 117/72 112/69 106/73 132/81  Pulse: 99 (!) 106 (!) 106 98  Resp: 16 18 18 12   Temp:  97.8 F (36.6 C)    TempSrc:  Oral    SpO2: 94% 96% 96% 96%  Weight:      Height:        Intake/Output Summary (Last 24 hours) at 06/12/2024 0707 Last data filed at 06/12/2024 0300 Gross per 24 hour  Intake 3008.33 ml  Output 1550 ml  Net 1458.33 ml      06/11/2024    5:00 AM 06/10/2024    3:21 PM 06/10/2024    6:55 AM  Last 3 Weights  Weight (lbs) 211 lb 6.7 oz 231 lb 0.7 oz 240 lb 15.4 oz  Weight (kg) 95.9 kg 104.8 kg 109.3 kg      Telemetry Atrial fibrillation with RVR- Personally Reviewed  ECG  No new ECG  Physical Exam  GEN: No acute distress, very pleasant Neck: No JVD Cardiac: Tachycardic with irregularly irregular rhythm, no murmurs, rubs, or gallops.  Respiratory: Clear to auscultation bilaterally. GI: Soft, nontender, non-distended  MS: No edema; warm to  touch with overlying tenderness to palpation and erythema, bandages overlying shins Neuro:  Nonfocal  Psych: Normal affect   Labs High Sensitivity Troponin:  No results for input(s): TROPONINIHS in the last 720 hours.  Recent Labs  Lab 06/10/24 0824 06/10/24 0959  TRNPT 39* 33*       Chemistry Recent Labs  Lab 06/10/24 0824 06/10/24 1436 06/11/24 0244 06/11/24 0809 06/11/24 1905 06/12/24 0238  NA 120*   < > 126* 127* 129* 132*  K 7.0*   < > 5.3* 4.7 4.0 3.8  CL 91*   < > 91* 91* 91* 94*  CO2 15*   < > 20* 23 26 26   GLUCOSE 154*   < > 82 80 96 84  BUN 133*   < > 120* 116* 103* 82*  CREATININE 4.67*   < > 3.58* 3.00* 2.19* 1.70*  CALCIUM  9.1   < > 9.1 8.6* 8.4* 8.3*  MG  --   --  3.0*  --   --  2.7*  PROT 8.2*  --   --   --   --   --   ALBUMIN 3.1*  --   --   --   --   --   AST 33  --   --   --   --   --  ALT 29  --   --   --   --   --   ALKPHOS 160*  --   --   --   --   --   BILITOT 0.5  --   --   --   --   --   GFRNONAA 9*   < > 12* 15* 21* 29*  ANIONGAP 15   < > 15 14 13 12    < > = values in this interval not displayed.    Lipids No results for input(s): CHOL, TRIG, HDL, LABVLDL, LDLCALC, CHOLHDL in the last 168 hours.  Hematology Recent Labs  Lab 06/10/24 0824 06/11/24 0440 06/12/24 0238  WBC 9.6 14.6* 11.7*  RBC 4.84 3.21* 3.41*  HGB 14.2 9.3* 9.9*  HCT 42.8 27.1* 29.6*  MCV 88.4 84.4 86.8  MCH 29.3 29.0 29.0  MCHC 33.2 34.3 33.4  RDW 15.6* 15.1 15.3  PLT 240 241 244   Thyroid   Recent Labs  Lab 06/11/24 0244  TSH 9.530*    BNP Recent Labs  Lab 06/10/24 0824  PROBNP 6,454.0*    DDimer No results for input(s): DDIMER in the last 168 hours.   Radiology  ECHOCARDIOGRAM COMPLETE Result Date: 06/10/2024    ECHOCARDIOGRAM REPORT   Patient Name:   [name] Date of Exam: 06/10/2024 Medical Rec #:  [id number]            Height:       64.0 in Accession #:    [id number]           Weight:       241.0 lb Date of Birth:  07/26/1940             BSA:          2.118 m Patient Age:    83 years             BP:           91/65 mmHg Patient Gender: F                    HR:           101 bpm. Exam Location:  Inpatient Procedure: 2D Echo, Cardiac Doppler and Color Doppler (Both Spectral and Color            Flow Doppler were utilized during procedure). REPORT CONTAINS CRITICAL RESULT Indications:    I31.3 Pericardial effusion (noninflammatory)  History:        Patient has prior history of Echocardiogram examinations, most                 recent 05/13/2024. Abnormal ECG, Arrythmias:Atrial Fibrillation,                 Signs/Symptoms:Bacteremia, Chest Pain, Shortness of Breath and                 Dyspnea; Risk Factors:Sleep Apnea and Hypertension. Pulmonary                 embolus.  Sonographer:    Ellouise Mose RDCS Referring Phys: [phone] KEVIN STEINL  Sonographer Comments: Patient is obese and Technically difficult study due to poor echo windows. Image acquisition challenging due to patient body habitus. Critical care team evaluating patient during exam, patient conversing with them. IMPRESSIONS  1. Left ventricular ejection fraction, by estimation, is 45 to 50%. The left ventricle has mildly decreased function. The left ventricle has no regional wall motion abnormalities. Left ventricular  diastolic parameters are indeterminate.  2. Right ventricular systolic function is mildly reduced. The right ventricular size is normal. There is normal pulmonary artery systolic pressure. The estimated right ventricular systolic pressure is 25.3 mmHg.  3. Pericardial effusion measures 2.19cm at short axis pap level and .77cm around the RV. Primarily posterior. No RA or RV collapse. Large pericardial effusion. There is no evidence of cardiac tamponade.  4. The mitral valve is normal in structure. Trivial mitral valve regurgitation. No evidence of mitral stenosis.  5. The aortic valve is tricuspid. Aortic valve regurgitation is not visualized. No aortic stenosis is present.   6. The inferior vena cava is dilated in size with <50% respiratory variability, suggesting right atrial pressure of 15 mmHg. FINDINGS  Left Ventricle: Left ventricular ejection fraction, by estimation, is 45 to 50%. The left ventricle has mildly decreased function. The left ventricle has no regional wall motion abnormalities. The left ventricular internal cavity size was normal in size. There is no left ventricular hypertrophy. Left ventricular diastolic parameters are indeterminate. Right Ventricle: The right ventricular size is normal. No increase in right ventricular wall thickness. Right ventricular systolic function is mildly reduced. There is normal pulmonary artery systolic pressure. The tricuspid regurgitant velocity is 2.36 m/s, and with an assumed right atrial pressure of 3 mmHg, the estimated right ventricular systolic pressure is 25.3 mmHg. Left Atrium: Left atrial size was normal in size. Right Atrium: Right atrial size was normal in size. Pericardium: Pericardial effusion measures 2.19cm at short axis pap level and .77cm around the RV. Primarily posterior. No RA or RV collapse. A large pericardial effusion is present. There is no evidence of cardiac tamponade. Presence of epicardial fat layer. Mitral Valve: The mitral valve is normal in structure. Trivial mitral valve regurgitation. No evidence of mitral valve stenosis. Tricuspid Valve: The tricuspid valve is normal in structure. Tricuspid valve regurgitation is mild . No evidence of tricuspid stenosis. Aortic Valve: The aortic valve is tricuspid. Aortic valve regurgitation is not visualized. No aortic stenosis is present. Pulmonic Valve: The pulmonic valve was normal in structure. Pulmonic valve regurgitation is not visualized. No evidence of pulmonic stenosis. Aorta: The aortic root and ascending aorta are structurally normal, with no evidence of dilitation. Venous: The inferior vena cava is dilated in size with less than 50% respiratory variability,  suggesting right atrial pressure of 15 mmHg. IAS/Shunts: No atrial level shunt detected by color flow Doppler.  LEFT VENTRICLE PLAX 2D LVIDd:         3.80 cm LVIDs:         3.10 cm LV PW:         1.70 cm LV IVS:        1.00 cm LVOT diam:     2.10 cm LV SV:         61 LV SV Index:   29 LVOT Area:     3.46 cm  LV Volumes (MOD) LV vol d, MOD A4C: 80.1 ml LV vol s, MOD A4C: 39.3 ml LV SV MOD A4C:     80.1 ml RIGHT VENTRICLE RV S prime:     10.10 cm/s TAPSE (M-mode): 1.3 cm LEFT ATRIUM           Index        RIGHT ATRIUM           Index LA diam:      3.50 cm 1.65 cm/m   RA Area:     19.60 cm LA Vol (A2C): 22.2  ml 10.48 ml/m  RA Volume:   49.60 ml  23.42 ml/m LA Vol (A4C): 20.0 ml 9.44 ml/m  AORTIC VALVE LVOT Vmax:   106.10 cm/s LVOT Vmean:  66.300 cm/s LVOT VTI:    0.178 m  AORTA Ao Root diam: 3.20 cm Ao Asc diam:  3.40 cm MITRAL VALVE               TRICUSPID VALVE MV Area (PHT): 4.31 cm    TR Peak grad:   22.3 mmHg MV Decel Time: 176 msec    TR Vmax:        236.00 cm/s MV E velocity: 84.50 cm/s                            SHUNTS                            Systemic VTI:  0.18 m                            Systemic Diam: 2.10 cm Morene Brownie Electronically signed by Morene Brownie Signature Date/Time: 06/10/2024/2:04:39 PM    Final    CT CHEST ABDOMEN PELVIS WO CONTRAST Result Date: 06/10/2024 CLINICAL DATA:  Generalized weakness for 2 weeks.  Sepsis. EXAM: CT CHEST, ABDOMEN AND PELVIS WITHOUT CONTRAST TECHNIQUE: Multidetector CT imaging of the chest, abdomen and pelvis was performed following the standard protocol without IV contrast. RADIATION DOSE REDUCTION: This exam was performed according to the departmental dose-optimization program which includes automated exposure control, adjustment of the mA and/or kV according to patient size and/or use of iterative reconstruction technique. COMPARISON:  May 12, 2024 FINDINGS: CT CHEST FINDINGS Cardiovascular: Aortic atherosclerosis. No evidence of thoracic aortic  aneurysm. Mild cardiomegaly. Coronary artery calcifications are noted. Moderate pericardial effusion or hemopericardium is now noted. Mediastinum/Nodes: Stable left thyroid  nodule which has been previously assessed on ultrasound. Esophagus is unremarkable. Calcified mediastinal lymph nodes are noted suggesting prior granulomatous disease. Lungs/Pleura: No pneumothorax or pleural effusion is noted. Left lingular and left lower lobe airspace opacities are noted with bronchograms concerning for possible pneumonia. Musculoskeletal: No chest wall mass or suspicious bone lesions identified. CT ABDOMEN PELVIS FINDINGS Hepatobiliary: No focal liver abnormality is seen. Status post cholecystectomy. No biliary dilatation. Pancreas: Unremarkable. No pancreatic ductal dilatation or surrounding inflammatory changes. Spleen: Normal in size without focal abnormality. Adrenals/Urinary Tract: Adrenal glands are unremarkable. Kidneys are normal, without renal calculi, focal lesion, or hydronephrosis. Bladder is unremarkable. Stomach/Bowel: Stomach is unremarkable. Status post appendectomy. Sigmoid diverticulosis without inflammation. No evidence of bowel obstruction. Vascular/Lymphatic: Aortic atherosclerosis. Bilateral inguinal adenopathy is noted. Left inguinal lymph node had been recently biopsied and correlation with pathology results is recommended. Reproductive: Status post hysterectomy. No adnexal masses. Other: Moderate size fat containing periumbilical hernia. No ascites. Musculoskeletal: No acute or significant osseous findings. IMPRESSION: 1. Moderate pericardial effusion or hemopericardium is now noted. 2. Left lingular and left lower lobe airspace opacities are noted with bronchograms concerning for pneumonia. 3. Bilateral inguinal adenopathy is noted. Left inguinal lymph node had been recently biopsied and correlation with pathology results is recommended. 4. Sigmoid diverticulosis without inflammation. 5. Moderate size  fat containing periumbilical hernia. 6. Aortic atherosclerosis. Aortic Atherosclerosis (ICD10-I70.0). Electronically Signed   By: Lynwood Landy Raddle M.D.   On: 06/10/2024 11:54   DG Chest Port 1 View Result Date: 06/10/2024 EXAM:  1 VIEW(S) XRAY OF THE CHEST 06/10/2024 07:50:00 AM COMPARISON: 05/12/2024 CLINICAL HISTORY: weakness FINDINGS: LUNGS AND PLEURA: Calcified granuloma of right costophrenic angle. Small to moderate left sided pleural effusion. Increasing left basilar opacities which may reflect atelectasis versus infection. Prominent central pulmonary vasculature without pulmonary edema. No pneumothorax. HEART AND MEDIASTINUM: Cardiomegaly. Atherosclerotic plaque. BONES AND SOFT TISSUES: No acute osseous abnormality. IMPRESSION: 1. Small to moderate left pleural effusion with increasing left basilar opacities, which may reflect atelectasis or infection. 2. Cardiomegaly. Electronically signed by: Donnice Mania MD 06/10/2024 08:56 AM EST RP Workstation: [id number]    Cardiac Studies TTE 06/10/24:   IMPRESSIONS     1. Left ventricular ejection fraction, by estimation, is 45 to 50%. The  left ventricle has mildly decreased function. The left ventricle has no  regional wall motion abnormalities. Left ventricular diastolic parameters  are indeterminate.   2. Right ventricular systolic function is mildly reduced. The right  ventricular size is normal. There is normal pulmonary artery systolic  pressure. The estimated right ventricular systolic pressure is 25.3 mmHg.   3. Pericardial effusion measures 2.19cm at short axis pap level and .77cm  around the RV. Primarily posterior. No RA or RV collapse. Large  pericardial effusion. There is no evidence of cardiac tamponade.   4. The mitral valve is normal in structure. Trivial mitral valve  regurgitation. No evidence of mitral stenosis.   5. The aortic valve is tricuspid. Aortic valve regurgitation is not  visualized. No aortic stenosis is present.    6. The inferior vena cava is dilated in size with <50% respiratory  variability, suggesting right atrial pressure of 15 mmHg.   Patient Profile   [name] is a 84 y.o. female with a hx of longstanding persistent AF (on warfarin), PE/DVT, HFpEF, HTN, HLD, new DM2, sarcoid, OSA and obesity who is being seen 06/11/2024 for the evaluation of pericardial effusion at the request of Dr. Shelah.   Assessment & Plan    #Moderate-Large Pericardial Effusion #New HFmrEF :: Patient presented with progressive weakness for 2 weeks found to be hypotensive.  She was treated with IV fluids with relative improvement in her blood pressures; however, her EF is now mildly reduced, BNP is elevated, and volume status is hard to gather.  Her IVC on TTE was dilated and not collapsible suggesting increased intracardiac pressures.  On my examination of her, she does not appear to be volume overloaded; however, I agree that it is hard to tell.  Further, her new pericardial effusion would also suggest a volume overloaded state.  ESR/CRP are elevated which could be suggestive of pericardial inflammation; however, looking back at her trend, these have chronically been elevated.  She has no clinical signs or symptoms of pericarditis.  Nephrology is on board and believes that she is still dry.  She is clinically improving with IV fluids so I agree with additional fluid administration as tolerated. Continue to monitor, but no concern for tamponade at this time Start Coreg  3.125 mg twice daily, holding on restarting irbesartan  due to resolving AKI Continue IVF per nephrology/primary May eventually need diuresis; however, currently appears euvolemic and is improving with IVF Strict I's and O's ESR/CRP are elevated, but clinically does not have evidence of pericarditis   #Longstanding Persistent AF with RVR # Supratherapeutic INR :: Labeled as permanent atrial fibrillation in the chart, but I do not believe the patient has  ever tried rhythm control at her request.  Regardless she has been in atrial fibrillation  for a long time and this is chronic.  Her heart rates were well-controlled initially, but now she is in RVR.  Will start low-dose beta-blocker to improve rate control.  Her INR was supratherapeutic on admission and this can be entirely avoided with a DOAC. Maintain telemetry Start Coreg  as above (cites intolerance to metoprolol ) Ideally would transition the patient to a DOAC prior to discharge instead of warfarin to avoid having another supratherapeutic INR.  Unless me or the patient are missing something, I do not see a strong reason for her to not be able to be on a DOAC.   #Hypotension #Possible Sepsis Per primary    CRITICAL CARE Performed by: Georganna Archer   Total critical care time: 35 minutes  Critical care time was exclusive of separately billable procedures and treating other patients.  Critical care was necessary to treat or prevent imminent or life-threatening deterioration.  Critical care was time spent personally by me on the following activities: development of treatment plan with patient and/or surrogate as well as nursing, discussions with consultants, evaluation of patient's response to treatment, examination of patient, obtaining history from patient or surrogate, ordering and performing treatments and interventions, ordering and review of laboratory studies, ordering and review of radiographic studies, pulse oximetry and re-evaluation of patient's condition.     For questions or updates, please contact [facility] Please consult [email or web address] for contact info under       Signed, Georganna Archer, MD  06/12/2024, 7:07 AM    "

## 2024-06-13 ENCOUNTER — Other Ambulatory Visit (HOSPITAL_COMMUNITY): Payer: Self-pay

## 2024-06-13 DIAGNOSIS — E039 Hypothyroidism, unspecified: Secondary | ICD-10-CM

## 2024-06-13 DIAGNOSIS — I3139 Other pericardial effusion (noninflammatory): Secondary | ICD-10-CM

## 2024-06-13 DIAGNOSIS — R531 Weakness: Principal | ICD-10-CM

## 2024-06-13 DIAGNOSIS — N179 Acute kidney failure, unspecified: Secondary | ICD-10-CM | POA: Diagnosis not present

## 2024-06-13 DIAGNOSIS — T45515A Adverse effect of anticoagulants, initial encounter: Secondary | ICD-10-CM

## 2024-06-13 DIAGNOSIS — D6832 Hemorrhagic disorder due to extrinsic circulating anticoagulants: Secondary | ICD-10-CM

## 2024-06-13 DIAGNOSIS — R7881 Bacteremia: Secondary | ICD-10-CM

## 2024-06-13 DIAGNOSIS — E875 Hyperkalemia: Secondary | ICD-10-CM

## 2024-06-13 DIAGNOSIS — B952 Enterococcus as the cause of diseases classified elsewhere: Secondary | ICD-10-CM

## 2024-06-13 DIAGNOSIS — I311 Chronic constrictive pericarditis: Secondary | ICD-10-CM

## 2024-06-13 LAB — CULTURE, BLOOD (ROUTINE X 2): Special Requests: ADEQUATE

## 2024-06-13 LAB — BASIC METABOLIC PANEL WITH GFR
Anion gap: 8 (ref 5–15)
BUN: 63 mg/dL — ABNORMAL HIGH (ref 8–23)
CO2: 27 mmol/L (ref 22–32)
Calcium: 8.1 mg/dL — ABNORMAL LOW (ref 8.9–10.3)
Chloride: 98 mmol/L (ref 98–111)
Creatinine, Ser: 1.26 mg/dL — ABNORMAL HIGH (ref 0.44–1.00)
GFR, Estimated: 42 mL/min — ABNORMAL LOW
Glucose, Bld: 89 mg/dL (ref 70–99)
Potassium: 4 mmol/L (ref 3.5–5.1)
Sodium: 133 mmol/L — ABNORMAL LOW (ref 135–145)

## 2024-06-13 LAB — GLUCOSE, CAPILLARY
Glucose-Capillary: 83 mg/dL (ref 70–99)
Glucose-Capillary: 86 mg/dL (ref 70–99)
Glucose-Capillary: 119 mg/dL — ABNORMAL HIGH (ref 70–99)
Glucose-Capillary: 112 mg/dL — ABNORMAL HIGH (ref 70–99)

## 2024-06-13 LAB — CBC
HCT: 30.2 % — ABNORMAL LOW (ref 36.0–46.0)
Hemoglobin: 9.7 g/dL — ABNORMAL LOW (ref 12.0–15.0)
MCH: 28.9 pg (ref 26.0–34.0)
MCHC: 32.1 g/dL (ref 30.0–36.0)
MCV: 89.9 fL (ref 80.0–100.0)
Platelets: 227 K/uL (ref 150–400)
RBC: 3.36 MIL/uL — ABNORMAL LOW (ref 3.87–5.11)
RDW: 15.5 % (ref 11.5–15.5)
WBC: 10.9 K/uL — ABNORMAL HIGH (ref 4.0–10.5)
nRBC: 0 % (ref 0.0–0.2)

## 2024-06-13 LAB — BPAM FFP
Blood Product Expiration Date: 202601031930
ISSUE DATE / TIME: 202601030217
Unit Type and Rh: 202601031930
Unit Type and Rh: 6200

## 2024-06-13 LAB — PREPARE FRESH FROZEN PLASMA: Unit division: 0

## 2024-06-13 LAB — PHOSPHORUS: Phosphorus: 2.5 mg/dL (ref 2.5–4.6)

## 2024-06-13 LAB — PROTIME-INR
INR: 1.5 — ABNORMAL HIGH (ref 0.8–1.2)
Prothrombin Time: 19.1 s — ABNORMAL HIGH (ref 11.4–15.2)

## 2024-06-13 LAB — MAGNESIUM: Magnesium: 2.7 mg/dL — ABNORMAL HIGH (ref 1.7–2.4)

## 2024-06-13 MED ORDER — ENOXAPARIN SODIUM 100 MG/ML IJ SOSY
100.0000 mg | PREFILLED_SYRINGE | Freq: Two times a day (BID) | INTRAMUSCULAR | Status: DC
  Administered 2024-06-13 – 2024-06-17 (×9): 100 mg via SUBCUTANEOUS
  Filled 2024-06-13 (×9): qty 1

## 2024-06-13 MED ORDER — MELATONIN 5 MG PO TABS
5.0000 mg | ORAL_TABLET | Freq: Every evening | ORAL | Status: DC | PRN
  Administered 2024-06-13 – 2024-06-15 (×3): 5 mg via ORAL
  Filled 2024-06-13 (×3): qty 1

## 2024-06-13 MED ORDER — SODIUM CHLORIDE 0.9 % IV SOLN: INTRAVENOUS | Status: DC

## 2024-06-13 MED ORDER — LORAZEPAM 1 MG PO TABS
1.5000 mg | ORAL_TABLET | Freq: Every evening | ORAL | Status: DC | PRN
  Administered 2024-06-13 – 2024-06-15 (×3): 1.5 mg via ORAL
  Filled 2024-06-13 (×3): qty 1

## 2024-06-13 MED ORDER — DILTIAZEM HCL 60 MG PO TABS
120.0000 mg | ORAL_TABLET | Freq: Three times a day (TID) | ORAL | Status: DC
  Administered 2024-06-13 – 2024-06-20 (×20): 120 mg via ORAL
  Filled 2024-06-13: qty 4
  Filled 2024-06-13 (×17): qty 2
  Filled 2024-06-13: qty 4
  Filled 2024-06-13 (×2): qty 2

## 2024-06-13 NOTE — Plan of Care (Signed)

## 2024-06-13 NOTE — Progress Notes (Signed)
 Report called. Patient stable at time of transfer.

## 2024-06-13 NOTE — Progress Notes (Signed)
 Heart Failure Navigator Progress Note  Assessed for Heart & Vascular TOC clinic readiness.  Patient does not meet criteria due to she has an scheduled [facility] appointment on 06/21/2024. No HF TOC at this time. .   Navigator will sign off at this time.   Stephane Haddock, BSN, Scientist, Clinical (histocompatibility And Immunogenetics) Only

## 2024-06-13 NOTE — Progress Notes (Signed)
 "  Rounding Note   Patient Name: [name] Date of Encounter: 06/13/2024  [facility] Cardiologist: Madonna Large, DO   Subjective Patient denies dyspnea or chest pain.  Scheduled Meds:  carvedilol   3.125 mg Oral BID WC   Chlorhexidine  Gluconate Cloth  6 each Topical Daily   diltiazem   120 mg Oral Q8H   insulin  aspart  0-15 Units Subcutaneous Q4H   levothyroxine   112 mcg Oral Q0600   nystatin    Topical BID   Continuous Infusions:  linezolid  (ZYVOX ) IV Stopped (06/13/24 1058)   PRN Meds: acetaminophen , HYDROmorphone  (DILAUDID ) injection, LORazepam , melatonin, ondansetron  (ZOFRAN ) IV, mouth rinse, oxyCODONE , polyethylene glycol, senna   Vital Signs  Vitals:   06/13/24 0500 06/13/24 0718 06/13/24 0800 06/13/24 0900  BP: 118/60  122/71 (!) 107/53  Pulse: (!) 110  (!) 106 (!) 108  Resp: 17  16 18   Temp:  (!) 97.4 F (36.3 C)    TempSrc:  Oral    SpO2: 94%  97% 95%  Weight:      Height:        Intake/Output Summary (Last 24 hours) at 06/13/2024 1131 Last data filed at 06/13/2024 1011 Gross per 24 hour  Intake 464.96 ml  Output 600 ml  Net -135.04 ml      06/13/2024    4:28 AM 06/11/2024    5:00 AM 06/10/2024    3:21 PM  Last 3 Weights  Weight (lbs) 237 lb 3.4 oz 211 lb 6.7 oz 231 lb 0.7 oz  Weight (kg) 107.6 kg 95.9 kg 104.8 kg      Telemetry Atrial fibrillation rate controlled.- Personally Reviewed   Physical Exam  GEN: No acute distress.   Neck: No JVD Cardiac: irregular Respiratory: Clear to auscultation bilaterally. GI: Soft, nontender, non-distended  MS: trace to 1+ edema; No deformity. Neuro:  Nonfocal  Psych: Normal affect   Labs  Recent Labs  Lab 06/10/24 0824 06/10/24 0959  TRNPT 39* 33*       Chemistry Recent Labs  Lab 06/10/24 0824 06/10/24 1436 06/11/24 0244 06/11/24 0809 06/11/24 1905 06/12/24 0238 06/13/24 0249  NA 120*   < > 126*   < > 129* 132* 133*  K 7.0*   < > 5.3*   < > 4.0 3.8 4.0  CL 91*   < > 91*   < >  91* 94* 98  CO2 15*   < > 20*   < > 26 26 27   GLUCOSE 154*   < > 82   < > 96 84 89  BUN 133*   < > 120*   < > 103* 82* 63*  CREATININE 4.67*   < > 3.58*   < > 2.19* 1.70* 1.26*  CALCIUM  9.1   < > 9.1   < > 8.4* 8.3* 8.1*  MG  --   --  3.0*  --   --  2.7* 2.7*  PROT 8.2*  --   --   --   --   --   --   ALBUMIN 3.1*  --   --   --   --   --   --   AST 33  --   --   --   --   --   --   ALT 29  --   --   --   --   --   --   ALKPHOS 160*  --   --   --   --   --   --  BILITOT 0.5  --   --   --   --   --   --   GFRNONAA 9*   < > 12*   < > 21* 29* 42*  ANIONGAP 15   < > 15   < > 13 12 8    < > = values in this interval not displayed.     Hematology Recent Labs  Lab 06/11/24 0440 06/12/24 0238 06/13/24 0249  WBC 14.6* 11.7* 10.9*  RBC 3.21* 3.41* 3.36*  HGB 9.3* 9.9* 9.7*  HCT 27.1* 29.6* 30.2*  MCV 84.4 86.8 89.9  MCH 29.0 29.0 28.9  MCHC 34.3 33.4 32.1  RDW 15.1 15.3 15.5  PLT 241 244 227   Thyroid   Recent Labs  Lab 06/11/24 0244  TSH 9.530*    BNP Recent Labs  Lab 06/10/24 0824  PROBNP 6,454.0*      Patient Profile   [name] is a 84 y.o. female with a hx of longstanding persistent AF (on warfarin), PE/DVT, HFpEF, HTN, HLD, new DM2, sarcoid, OSA and obesity who is being seen for the evaluation of pericardial effusion.   Assessment & Plan  1 pericardial effusion-large on previous echocardiogram.  However no evidence of tamponade.  Question related to uremia as BUN was over 130.  Will repeat limited echo to reassess (BUN decreased to 63).  2 mild cardiomyopathy-ejection fraction mildly reduced on most recent echocardiogram.  Will plan repeat study in 3 to 6 months.  If LV function remains decreased further workup could be performed at that time.  3 dehydration-patient severely dehydrated at time of admission.  Would continue to hold diuretics at this point.  4 permanent atrial fibrillation-continue carvedilol  and Cardizem  for rate control.  Will consider  changing Coumadin  to apixaban if patient agreeable.  5 acute kidney injury-improving.  Continue to hold Lasix  for now.  Continue to hold ARB.  For questions or updates, please contact [facility] Please consult [email or web address] for contact info under     Signed, Redell Shallow, MD  06/13/2024, 11:31 AM    "

## 2024-06-13 NOTE — TOC Initial Note (Signed)
 Transition of Care [facility]) - Initial/Assessment Note    Patient Details  Name: [name] MRN: [id number] Date of Birth: 1941-02-12  Transition of Care [place] [facility]) CM/SW Contact:    Jon ONEIDA Anon, RN Phone Number: 06/13/2024, 11:17 AM  Clinical Narrative:                 Pt is from home. Pt came to the hospital via EMS with complaints of generalized weakness x 2 weeks. Pt uses home O2 at baseline through [employer]. Pt needing continued medical workup, not medically ready for discharge. PT/OT consulted, awaiting recommendations. ICM will continue to follow for DC planning needs.     Expected Discharge Plan:  (TBD) Barriers to Discharge: Continued Medical Work up   Patient Goals and CMS Choice Patient states their goals for this hospitalization and ongoing recovery are:: To return home CMS Medicare.gov Compare Post Acute Care list provided to:: Patient Choice offered to / list presented to : Patient [place] ownership interest in [facility].provided to:: Patient    Expected Discharge Plan and Services In-house Referral: NA Discharge Planning Services: CM Consult Post Acute Care Choice: Durable Medical Equipment Living arrangements for the past 2 months: Single Family Home                 DME Arranged: N/A DME Agency: NA       HH Arranged: NA HH Agency: NA        Prior Living Arrangements/Services Living arrangements for the past 2 months: Single Family Home Lives with:: Spouse Patient language and need for interpreter reviewed:: Yes Do you feel safe going back to the place where you live?: Yes      Need for Family Participation in Patient Care: Yes (Comment) Care giver support system in place?: Yes (comment) Current home services: DME Criminal Activity/Legal Involvement Pertinent to Current Situation/Hospitalization: No - Comment as needed  Activities of Daily Living   ADL Screening (condition at time of admission) Independently performs ADLs?: Yes  (appropriate for developmental age) Is the patient deaf or have difficulty hearing?: No Does the patient have difficulty seeing, even when wearing glasses/contacts?: No Does the patient have difficulty concentrating, remembering, or making decisions?: No  Permission Sought/Granted Permission sought to share information with : Family Supports    Share Information with NAME: Cowgill, [name]  Spouse, Emergency Contact  [phone]           Emotional Assessment Appearance:: Other (Comment Required (UTA) Attitude/Demeanor/Rapport: Unable to Assess Affect (typically observed): Unable to Assess Orientation: : Oriented to Self, Oriented to Place, Oriented to  Time, Oriented to Situation Alcohol  / Substance Use: Not Applicable Psych Involvement: No (comment)  Admission diagnosis:  Dehydration [E86.0] Hyperkalemia [E87.5] Hyponatremia [E87.1] Pericardial effusion [I31.39] Uremia [N19] Pulmonary infiltrates [R91.8] Bilateral leg edema [R60.0] Elevated brain natriuretic peptide (BNP) level [R79.89] Atrial fibrillation with slow ventricular response (HCC) [I48.91] Generalized weakness [R53.1] AKI (acute kidney injury) [N17.9] Ulcers of both lower legs (HCC) [L97.919, L97.929] Hypotension due to hypovolemia [E86.1] Patient Active Problem List   Diagnosis Date Noted   Generalized weakness 06/13/2024   Pericardial effusion 06/13/2024   Warfarin-induced coagulopathy 06/13/2024   Hyperkalemia 06/13/2024   AKI (acute kidney injury) 06/10/2024   Atrial fibrillation with RVR (HCC) 05/12/2024   Atypical chest pain 05/12/2024   Acute on chronic heart failure with preserved ejection fraction (HFpEF) (HCC) 09/26/2022   Permanent atrial fibrillation (HCC) 09/26/2022   Warfarin anticoagulation 09/18/2022   Sepsis (HCC) 09/18/2022   Sepsis  due to cellulitis (HCC) 09/18/2022   Lower extremity cellulitis 09/18/2022   Chronic intermittent hypoxia with obstructive sleep apnea 09/12/2021    Severe obstructive sleep apnea-hypopnea syndrome 09/12/2021   Acute medial meniscus tear of right knee 03/31/2018   Acute pain of right knee 01/12/2018   Hypercoagulation syndrome 05/24/2014   Hunter's glossitis 05/24/2014   Sarcoidosis 05/24/2014   Hypoxemia 05/24/2014   Primary hypertension 10/13/2013   Nocturnal hypoxia 05/19/2013   Obstructive sleep apnea (adult) (pediatric) 05/19/2013   Obesity 05/19/2013   OSA on CPAP    Diffuse lymphadenopathy 05/26/2012   Cough 05/20/2012   OSA (obstructive sleep apnea) 05/20/2012   Pulmonary embolism (HCC) 04/29/2012   DVT (deep venous thrombosis) (HCC) 04/29/2012   PCP:  [name], MD Pharmacy:   [facility] [address], [place] - [address] [place] [address] Phone: [phone] Fax: [phone]  [facility] [address] [place] [address] Phone: [phone] Fax: [phone]     Social Drivers of Health (SDOH) Social History: SDOH Screenings   Food Insecurity: No Food Insecurity (06/10/2024)  Housing: Low Risk (06/10/2024)  Transportation Needs: No Transportation Needs (06/10/2024)  Utilities: Not At Risk (06/10/2024)  Depression (PHQ2-9): Low Risk (05/27/2024)  Social Connections: Moderately Integrated (06/10/2024)  Tobacco Use: Low Risk (06/10/2024)   SDOH Interventions:     Readmission Risk Interventions    06/13/2024   11:12 AM 09/19/2022    2:13 PM  Readmission Risk Prevention Plan  Post Dischage Appt  Complete  Medication Screening  Complete  Transportation Screening Complete   PCP or Specialist Appt within 3-5 Days Complete   HRI or Home Care Consult Complete   Social Work Consult for Recovery Care Planning/Counseling Complete   Palliative Care Screening Not Applicable   Medication Review Oceanographer) Complete

## 2024-06-13 NOTE — Progress Notes (Signed)
 eLink Physician-Brief Progress Note Patient Name: [name] DOB: 1940/09/13 MRN: [id number]   Date of Service  06/13/2024  HPI/Events of Note  None acute kidney injury with prerenal etiology complicated by ATN, nephrology recommends ongoing IVF  eICU Interventions  Renew normal saline at 50 cc an hour   0023 -requesting home Ativan , not appropriate at this time.  0234 -melatonin as needed for insomnia  Intervention Category Intermediate Interventions: Hypovolemia - evaluation and management  Kydan Shanholtzer 06/13/2024, 12:02 AM

## 2024-06-13 NOTE — Plan of Care (Signed)

## 2024-06-13 NOTE — Progress Notes (Signed)
 "  NAME:  [name], MRN:  [id number], DOB:  [birth year], LOS: 3 ADMISSION DATE:  06/10/2024, CONSULTATION DATE:  1/2 REFERRING MD:  Dr Bernard, CHIEF COMPLAINT:  Hypotension  History of Present Illness:  84 year old female with PMH as below, which is significant for Atrial fibrillation and PE on warfarin, OSA on CPAP, sarcoidosis, HTN, thyroid  cancer, and HFpEF. She has recently been treated for cellulitis of both legs and admitted for atypical chest pain and AF RVR. She was discharged on lasix , doxycyline. Course also complicated by diffuse lymphadenopathy for which she has since had a biopsy and followed up with oncology for what is no longer felt to be a malignant process. She is now presenting to [facility] ED 1/2 with complaints of weakness. Unable to move at home so husband called EMS. En route to the ED she was noted to be hypotensive with systolic blood pressures in the 70s. Responded to IVF. Laboratory evaluation significant for renal failure and hyperkalemia and well as INR > 10. K was temporized in the ED. CT abdomen pelvis showed a moderate pericardial effusion vs hemopericardium and concern for left lower lobe pneumonia. PCCM asked to evaluate for hypotension and pericardial effusion.   Pertinent  Medical History   has a past medical history of Anxiety, Arthritis, Cancer (HCC), Cataracts, bilateral, Chronic back pain, Family history of adverse reaction to anesthesia, History of blood transfusion, History of bronchitis (05/2015), History of colon polyps, Hyperlipidemia, Hypertension, Hypothyroidism, Ischemic colitis, Joint pain, Joint swelling, OSA on CPAP, PE (pulmonary embolism), Peripheral edema, Pneumonia (1992), Sarcoidosis, Shortness of breath, Urinary frequency, Weakness, and Wheeze.   Significant Hospital Events: Including procedures, antibiotic start and stop dates in addition to other pertinent events   1/2 admit to ICU Blood culture 1/2 > 1 of 2 GPC, BC ID negative so  far 1/2 echo: LVEF 45-50%, RV systolic function mildly reduced. RV size normal. Large pericardial effusion, no collapse of RV or RA. No concern for pericardial tamponade based on Echo.  1/2 CT abd/chest: Moderate pericardial effusion vs hemopericardium noted. (New from scan 12/4). Left lingular and left lower lobe opacities. Bilateral inguinal adenopathy 1/5 transfer to [facility]  Interim History / Subjective:   Patient sitting up in bed this morning reports feeling stiff but denies any shortness of breath or chest pain. Overall feeling improved, hoping to get out of bed today.  Objective    Blood pressure 122/71, pulse (!) 106, temperature (!) 97.4 F (36.3 C), temperature source Oral, resp. rate 16, height 5' 4 (1.626 m), weight 107.6 kg, SpO2 97%.        Intake/Output Summary (Last 24 hours) at 06/13/2024 9095 Last data filed at 06/12/2024 2355 Gross per 24 hour  Intake 782.54 ml  Output 600 ml  Net 182.54 ml   Filed Weights   06/10/24 1521 06/11/24 0500 06/13/24 0428  Weight: 104.8 kg 95.9 kg 107.6 kg    Examination: General: chronically ill appearing woman in no acute distres  HENT: No secretions, oropharynx clear, pupils equal Lungs: clear bilaterally, no O2 Cardiovascular: Irregularly irregular and distant, heart rate >100bpm Abdomen: Obese, nondistended with positive bowel sounds Extremities: anterior lower extremity wounds are wrapped Neuro: Awake and alert, answers questions, well-oriented, moves all extremities  Resolved problem list   Hypotension Coagulopathy  AKI Hyperkalemia   Assessment and Plan   Hypotension, on arrival: etiology not entirely clear. Likely hypovolemic considering recent poor intake and vomiting, but also BNP is elevated and she has edema  so hard to get a true sense of her volume status. BP did improve with volume resuscitation.  Consider severe sepsis. Lactic 1.8 -Antibiotics simplified to linezolid  on 1/3 (received cefepime  and linezolid  on 1/2)  1 of 2 blood cultures staph epi, likely contaminant  - BP stable, discontinue IV fluids 1/5  PT/OT consults today Transfer to [facility] service today  Staphylococcus epidermidis bacteremia  Possible contaminant, however leukocytosis noted with some improvement, chronic lower extremity wounds without evidence of acute infection - 2/4 blood cultures - Linezolid  (started 1/2)  Atrial Fibrillation  Pericardial effusion: Etiology unclear. Possible uremia, coagulopathy, volume. HRmrEF (45-50%) -Appreciate cardiology evaluation, no evidence tamponade physiology currently -Warfarin on hold since admission, consider DOAC -Started on coreg  3.125mg  BID per cardiology -Home PO lasix  is on hold due to hypotension, resume as indicated -Resume cardizem  today due to tachycardia, 120mg  q8h (takes 320mg  24 hr at home)  -Irbesartan  remains on hold due to improving AKI  AKI, presumed due to hypovolemia/ATN, improving  Hyperkalemia, improved - K+ 4.0 - Cr 1.26 (from 4.67 on admission, 0.80 baseline) - Irbesartan  and Lasix  (home meds) remain on hold - Discontinue IV fluids 1/5  Acute Anemia Warfarin coagulopathy, resolved  Reported hematochezia (on admission) - Received vitamin K  and FFP 1/2 - Warfarin held since admission, restart when stable to do so - Hgb 9.7 this morning (14 on admission) - No evidence of active bleeding - Check occult stool - Daily H&H  Lower extremity wounds: no clear signs of active infection, but consider cellulitis.  She has had pain - Appreciate WOC evaluation - No clear evidence of infection or cellulitis but remains on empiric linezolid  as above  OSA on CPAP - Continue CPAP nightly  Sarcoid - Supportive care  Lymphadenopathy -inguinal node biopsy negative 12/19, unlikely to be malignant per oncology.  - Supportive care  Hypothyroidism  - Elevated TSH, 9.530 - Continue current Synthroid  for now.  Likely need to recheck TSH once acute illness resolves.  Dose adjust  Synthroid  if abnormal at that time    Critical care time:  30 minutes    Shevy Yaney, PA-C  Pulmonary & Critical Care Medicine For pager details, please see AMION or use Epic chat  After 1900, please call [facility] for cross coverage needs 06/13/2024, 9:19 AM         "

## 2024-06-14 ENCOUNTER — Inpatient Hospital Stay (HOSPITAL_COMMUNITY)

## 2024-06-14 ENCOUNTER — Other Ambulatory Visit (HOSPITAL_COMMUNITY): Payer: Self-pay

## 2024-06-14 DIAGNOSIS — I3139 Other pericardial effusion (noninflammatory): Secondary | ICD-10-CM | POA: Diagnosis not present

## 2024-06-14 LAB — BASIC METABOLIC PANEL WITH GFR
Anion gap: 7 (ref 5–15)
BUN: 41 mg/dL — ABNORMAL HIGH (ref 8–23)
CO2: 29 mmol/L (ref 22–32)
Calcium: 8.3 mg/dL — ABNORMAL LOW (ref 8.9–10.3)
Chloride: 98 mmol/L (ref 98–111)
Creatinine, Ser: 1 mg/dL (ref 0.44–1.00)
GFR, Estimated: 55 mL/min — ABNORMAL LOW
Glucose, Bld: 88 mg/dL (ref 70–99)
Potassium: 4 mmol/L (ref 3.5–5.1)
Sodium: 134 mmol/L — ABNORMAL LOW (ref 135–145)

## 2024-06-14 LAB — ECHOCARDIOGRAM LIMITED
Est EF: 50
Height: 64 in
Weight: 3798.97 [oz_av]

## 2024-06-14 LAB — GLUCOSE, CAPILLARY
Glucose-Capillary: 123 mg/dL — ABNORMAL HIGH (ref 70–99)
Glucose-Capillary: 142 mg/dL — ABNORMAL HIGH (ref 70–99)
Glucose-Capillary: 143 mg/dL — ABNORMAL HIGH (ref 70–99)
Glucose-Capillary: 161 mg/dL — ABNORMAL HIGH (ref 70–99)
Glucose-Capillary: 85 mg/dL (ref 70–99)
Glucose-Capillary: 96 mg/dL (ref 70–99)

## 2024-06-14 LAB — HEMOGLOBIN AND HEMATOCRIT, BLOOD
HCT: 29.2 % — ABNORMAL LOW (ref 36.0–46.0)
Hemoglobin: 9.4 g/dL — ABNORMAL LOW (ref 12.0–15.0)

## 2024-06-14 NOTE — Progress Notes (Signed)
" °  Progress Note   Patient: [name] FMW:[id number] DOB: 1940/10/17 DOA: 06/10/2024     4 DOS: the patient was seen and examined on 06/14/2024   Brief hospital course: 85 year old woman PMH including A-fib on warfarin, sarcoidosis, chronic lower extremity wounds, presenting with weakness, hypotension.  Workup revealed multiple abnormalities including AKI, hyperkalemia, coagulopathy, moderate pericardial effusion.  Admitted by critical care for hypotension and multiple issues.  Given FFP, vitamin K , Lokelma  and placed on bicarb infusion.  Treated with antibiotics empirically.  Consultants 1/6 admitted by [facility] Cardiology  Significant Hospital Events:  1/2 admit to ICU Blood culture 1/2 > 1 of 2 GPC, BC ID negative so far 1/2 echo: LVEF 45-50%, RV systolic function mildly reduced. RV size normal. Large pericardial effusion, no collapse of RV or RA. No concern for pericardial tamponade based on Echo.  1/2 CT abd/chest: Moderate pericardial effusion vs hemopericardium noted. (New from scan 12/4). Left lingular and left lower lobe opacities. Bilateral inguinal adenopathy 1/5 transfer to [facility]  Shock Staph epidermidis bacteremia Lower extremity wounds Shock resolved.  Treated with empiric antibiotics by critical care. Suspect staph is contaminant.  Perhaps discussed with ID tomorrow.  Pericardial effusion Hemorrhagic, secondary to supratherapeutic INR versus uremic Management per cardiology.  Mild cardiomyopathy New HFmrEF  Follow-up as an outpatient, repeat echo in 3 to 6 months Holding diuretics  AKI Uremia Hyperkalemia  Hyponatremia Metabolic acidosis  Secondary to ischemic and prerenal insults from hypotension and prolonged decreased oral intake.  Seen by nephrology.  Treated with Lokelma  and bicarbonate infusion.  ARB held. Renal function has recovered, uremia resolved, potassium within normal limits.  Acute anemia Warfarin coagulopathy Hematochezia on  admission Received vitamin K  and FFP 1/2 Hemoglobin stable.  Permanent atrial fibrillation Slow ventricular response on admission  IV continue carvedilol , Cardizem  Consider changing warfarin to apixaban, apparently in the past patient was unable to afford this.  Hypothyroidism TSH elevated.  Continue Synthroid .  Sarcoid OSA on CPAP     Subjective:  Feels okay today  Physical Exam: Vitals:   06/14/24 0425 06/14/24 0500 06/14/24 1334 06/14/24 2014  BP: 118/68  107/79 115/62  Pulse: 94  89 83  Resp: 17  (!) 21 16  Temp: 97.8 F (36.6 C)  98.1 F (36.7 C) 98.1 F (36.7 C)  TempSrc: Oral  Oral Oral  SpO2: 97%  96% 95%  Weight:  107.7 kg    Height:       Physical Exam Vitals reviewed.  Constitutional:      General: She is not in acute distress.    Appearance: She is not ill-appearing or toxic-appearing.  Cardiovascular:     Rate and Rhythm: Normal rate and regular rhythm.     Heart sounds: No murmur heard. Pulmonary:     Effort: Pulmonary effort is normal. No respiratory distress.     Breath sounds: No wheezing, rhonchi or rales.  Neurological:     Mental Status: She is alert.  Psychiatric:        Mood and Affect: Mood normal.        Behavior: Behavior normal.     Data Reviewed: CBG stable Creatinine within normal limits now, BUN down to 41 Hemoglobin stable at 9.4  Family Communication: Husband at bedside  Disposition: Status is: Inpatient Remains inpatient appropriate because: AKI     Time spent: 35 minutes  Author: Toribio Door, MD 06/14/2024 8:40 PM  For on call review [email or web address].    "

## 2024-06-14 NOTE — Plan of Care (Signed)

## 2024-06-14 NOTE — Evaluation (Signed)
 Physical Therapy Evaluation Patient Details Name: [name] MRN: [id number] DOB: 05/23/1941 Today's Date: 06/14/2024  History of Present Illness  84 yo female admitted with AKI, pericardial effusion. Hx of Afib, PE, OSA, sarcoidosis, HF, DVT, DM, obesity, chronic back pain, chronic L hip pain, cellulitis  Clinical Impression  On eval, pt required Min A for mobility. She ambulated ~25 feet with a RW. O2 86% on RA. Pt presents with general weakness, decreased activity tolerance, and impaired gait/balance. Husband was present during session. Discussed d/c plan with both--pt needs to be able to get up stairs in order to go home. Patient is open to short term rehab if necessary. Encouraged her to try to be OOB to chair, at least once a day. Patient will benefit from continued inpatient follow up therapy, <3 hours/day, if patient and family are agreeable. If pt progresses well, she could potentially return home with HHPT f/u--as long as her husband can care for her.        If plan is discharge home, recommend the following: A little help with walking and/or transfers;A little help with bathing/dressing/bathroom;Assistance with cooking/housework;Assist for transportation;Help with stairs or ramp for entrance   Can travel by private vehicle        Equipment Recommendations None recommended by PT  Recommendations for Other Services       Functional Status Assessment Patient has had a recent decline in their functional status and demonstrates the ability to make significant improvements in function in a reasonable and predictable amount of time.     Precautions / Restrictions Precautions Precautions: Fall Precaution/Restrictions Comments: monitor O2 Restrictions Weight Bearing Restrictions Per Provider Order: No      Mobility  Bed Mobility Overal bed mobility: Needs Assistance Bed Mobility: Supine to Sit     Supine to sit: Min assist, HOB elevated, Used rails     General bed  mobility comments: Increased time. Encouraged pt to perform as much of task as she could unassisted. Handheld assist provided at pt's request for trunk to full upright position. Pt able to scoot to EOB unassisted. She reported some mild lightheadedness.    Transfers Overall transfer level: Needs assistance Equipment used: Rolling walker (2 wheels) Transfers: Sit to/from Stand Sit to Stand: Min assist           General transfer comment: Cues for safety. Pt verbalized good, safe technqiue/hand placement.  Light assist to steady while rising.    Ambulation/Gait Ambulation/Gait assistance: Min assist Gait Distance (Feet): 25 Feet Assistive device: Rolling walker (2 wheels) Gait Pattern/deviations: Step-through pattern, Decreased stride length       General Gait Details: Cues for safety, RW proximity. Assist to steady pt throughout distance. Followed closely with recliner and used it to transport pt back to bedside. O2 86% on RA  Stairs            Wheelchair Mobility     Tilt Bed    Modified Rankin (Stroke Patients Only)       Balance Overall balance assessment: Needs assistance         Standing balance support: Bilateral upper extremity supported, During functional activity, Reliant on assistive device for balance Standing balance-Leahy Scale: Poor                               Pertinent Vitals/Pain Pain Assessment Pain Assessment: Faces Faces Pain Scale: Hurts little more Pain Location: bil LEs Pain Descriptors / Indicators:  Aching, Discomfort Pain Intervention(s): Limited activity within patient's tolerance, Monitored during session, Repositioned    Home Living Family/patient expects to be discharged to:: Private residence Living Arrangements: Spouse/significant other Available Help at Discharge: Family;Available 24 hours/day Type of Home: House Home Access: Stairs to enter Entrance Stairs-Rails: Doctor, General Practice of  Steps: 5   Home Layout: One level Home Equipment: Agricultural Consultant (2 wheels);Wheelchair - manual;BSC/3in1;Cane - single point;Shower seat;Hand held shower head      Prior Function Prior Level of Function : Independent/Modified Independent             Mobility Comments: Pt reports that recently she has been limited by hip pain however mostly Mod I with RW ADLs Comments: Mod I for ADLs, IADLs     Extremity/Trunk Assessment   Upper Extremity Assessment Upper Extremity Assessment: Defer to OT evaluation    Lower Extremity Assessment Lower Extremity Assessment: Generalized weakness (wrappings on bilateral lower legs)    Cervical / Trunk Assessment Cervical / Trunk Assessment: Normal  Communication   Communication Communication: No apparent difficulties    Cognition Arousal: Alert Behavior During Therapy: WFL for tasks assessed/performed   PT - Cognitive impairments: No apparent impairments                         Following commands: Intact       Cueing Cueing Techniques: Verbal cues     General Comments      Exercises     Assessment/Plan    PT Assessment Patient needs continued PT services  PT Problem List Decreased strength;Decreased range of motion;Decreased activity tolerance;Decreased balance;Decreased mobility;Decreased knowledge of use of DME       PT Treatment Interventions DME instruction;Gait training;Functional mobility training;Therapeutic activities;Therapeutic exercise;Stair training;Patient/family education;Balance training    PT Goals (Current goals can be found in the Care Plan section)  Acute Rehab PT Goals Patient Stated Goal: home. regain ind/plof PT Goal Formulation: With patient/family Time For Goal Achievement: 06/28/24 Potential to Achieve Goals: Good    Frequency Min 3X/week     Co-evaluation               AM-PAC PT 6 Clicks Mobility  Outcome Measure Help needed turning from your back to your side while in  a flat bed without using bedrails?: A Little Help needed moving from lying on your back to sitting on the side of a flat bed without using bedrails?: A Little Help needed moving to and from a bed to a chair (including a wheelchair)?: A Little Help needed standing up from a chair using your arms (e.g., wheelchair or bedside chair)?: A Little Help needed to walk in hospital room?: A Little Help needed climbing 3-5 steps with a railing? : A Lot 6 Click Score: 17    End of Session Equipment Utilized During Treatment: Gait belt Activity Tolerance: Patient limited by fatigue;Patient tolerated treatment well Patient left: in chair;with call bell/phone within reach;with chair alarm set;with family/visitor present   PT Visit Diagnosis: Muscle weakness (generalized) (M62.81);Difficulty in walking, not elsewhere classified (R26.2)    Time: 8840-8767 PT Time Calculation (min) (ACUTE ONLY): 33 min   Charges:   PT Evaluation $PT Eval Low Complexity: 1 Low PT Treatments $Gait Training: 8-22 mins PT General Charges $$ ACUTE PT VISIT: 1 Visit            Dannial SQUIBB, PT Acute Rehabilitation  Office: [phone]

## 2024-06-14 NOTE — TOC Initial Note (Signed)
 Transition of Care [facility]) - Initial/Assessment Note    Patient Details  Name: [name] MRN: [id number] Date of Birth: August 15, 1940  Transition of Care [facility]) CM/SW Contact:    Bascom Service, RN Phone Number: 06/14/2024, 4:06 PM  Clinical Narrative: PT recc ST SNF-faxed out per patient permission-await bed offers,choice,then auth.                  Expected Discharge Plan: Skilled Nursing Facility Barriers to Discharge: Continued Medical Work up   Patient Goals and CMS Choice Patient states their goals for this hospitalization and ongoing recovery are:: Rehab CMS Medicare.gov Compare Post Acute Care list provided to:: Patient Choice offered to / list presented to : Patient [place] ownership interest in [facility]provided to:: Patient    Expected Discharge Plan and Services In-house Referral: NA Discharge Planning Services: CM Consult Post Acute Care Choice: Skilled Nursing Facility Living arrangements for the past 2 months: Single Family Home                 DME Arranged: N/A DME Agency: NA       HH Arranged: NA HH Agency: NA        Prior Living Arrangements/Services Living arrangements for the past 2 months: Single Family Home Lives with:: Spouse Patient language and need for interpreter reviewed:: Yes Do you feel safe going back to the place where you live?: Yes      Need for Family Participation in Patient Care: Yes (Comment) Care giver support system in place?: Yes (comment) Current home services: DME (rw,3n1,active w/[employer] home 02) Criminal Activity/Legal Involvement Pertinent to Current Situation/Hospitalization: No - Comment as needed  Activities of Daily Living   ADL Screening (condition at time of admission) Independently performs ADLs?: Yes (appropriate for developmental age) Is the patient deaf or have difficulty hearing?: No Does the patient have difficulty seeing, even when wearing glasses/contacts?: No Does the patient have  difficulty concentrating, remembering, or making decisions?: No  Permission Sought/Granted Permission sought to share information with : Family Supports    Share Information with NAME: Bowler, [name]  Spouse, Emergency Contact  [phone]           Emotional Assessment Appearance:: Other (Comment Required (UTA) Attitude/Demeanor/Rapport: Unable to Assess Affect (typically observed): Unable to Assess Orientation: : Oriented to Self, Oriented to Place, Oriented to  Time, Oriented to Situation Alcohol  / Substance Use: Not Applicable Psych Involvement: No (comment)  Admission diagnosis:  Dehydration [E86.0] Hyperkalemia [E87.5] Hyponatremia [E87.1] Pericardial effusion [I31.39] Uremia [N19] Pulmonary infiltrates [R91.8] Bilateral leg edema [R60.0] Elevated brain natriuretic peptide (BNP) level [R79.89] Atrial fibrillation with slow ventricular response (HCC) [I48.91] Generalized weakness [R53.1] AKI (acute kidney injury) [N17.9] Ulcers of both lower legs (HCC) [L97.919, L97.929] Hypotension due to hypovolemia [E86.1] Patient Active Problem List   Diagnosis Date Noted   Generalized weakness 06/13/2024   Pericardial effusion 06/13/2024   Warfarin-induced coagulopathy 06/13/2024   Hyperkalemia 06/13/2024   AKI (acute kidney injury) 06/10/2024   Atrial fibrillation with RVR (HCC) 05/12/2024   Atypical chest pain 05/12/2024   Acute on chronic heart failure with preserved ejection fraction (HFpEF) (HCC) 09/26/2022   Permanent atrial fibrillation (HCC) 09/26/2022   Warfarin anticoagulation 09/18/2022   Sepsis (HCC) 09/18/2022   Sepsis due to cellulitis (HCC) 09/18/2022   Lower extremity cellulitis 09/18/2022   Chronic intermittent hypoxia with obstructive sleep apnea 09/12/2021   Severe obstructive sleep apnea-hypopnea syndrome 09/12/2021   Acute medial meniscus tear of right knee 03/31/2018  Acute pain of right knee 01/12/2018   Hypercoagulation syndrome 05/24/2014    Hunter's glossitis 05/24/2014   Sarcoidosis 05/24/2014   Hypoxemia 05/24/2014   Primary hypertension 10/13/2013   Nocturnal hypoxia 05/19/2013   Obstructive sleep apnea (adult) (pediatric) 05/19/2013   Obesity 05/19/2013   OSA on CPAP    Diffuse lymphadenopathy 05/26/2012   Cough 05/20/2012   OSA (obstructive sleep apnea) 05/20/2012   Pulmonary embolism (HCC) 04/29/2012   DVT (deep venous thrombosis) (HCC) 04/29/2012   PCP:  [name], MD Pharmacy:   [facility] [address], [place] - [address] [place] [address] Phone: [phone] Fax: [phone]  [facility] [address] [place] [address] Phone: [phone] Fax: [phone]     Social Drivers of Health (SDOH) Social History: SDOH Screenings   Food Insecurity: No Food Insecurity (06/10/2024)  Housing: Low Risk (06/10/2024)  Transportation Needs: No Transportation Needs (06/10/2024)  Utilities: Not At Risk (06/10/2024)  Depression (PHQ2-9): Low Risk (05/27/2024)  Social Connections: Moderately Integrated (06/10/2024)  Tobacco Use: Low Risk (06/10/2024)   SDOH Interventions:     Readmission Risk Interventions    06/13/2024   11:12 AM 09/19/2022    2:13 PM  Readmission Risk Prevention Plan  Post Dischage Appt  Complete  Medication Screening  Complete  Transportation Screening Complete   PCP or Specialist Appt within 3-5 Days Complete   HRI or Home Care Consult Complete   Social Work Consult for Recovery Care Planning/Counseling Complete   Palliative Care Screening Not Applicable   Medication Review Oceanographer) Complete

## 2024-06-14 NOTE — Progress Notes (Signed)
 "  Rounding Note   Patient Name: [name] Date of Encounter: 06/14/2024  [facility] Cardiologist: Madonna Large, DO   Subjective No CP or dyspnea  Scheduled Meds:  carvedilol   3.125 mg Oral BID WC   Chlorhexidine  Gluconate Cloth  6 each Topical Daily   diltiazem   120 mg Oral Q8H   enoxaparin  (LOVENOX ) injection  100 mg Subcutaneous BID   insulin  aspart  0-15 Units Subcutaneous Q4H   levothyroxine   112 mcg Oral Q0600   nystatin    Topical BID   Continuous Infusions:  linezolid  (ZYVOX ) IV 20 mL/hr at 06/14/24 1150   PRN Meds: acetaminophen , HYDROmorphone  (DILAUDID ) injection, LORazepam , melatonin, ondansetron  (ZOFRAN ) IV, mouth rinse, oxyCODONE , polyethylene glycol, senna   Vital Signs  Vitals:   06/13/24 2117 06/13/24 2350 06/14/24 0425 06/14/24 0500  BP: 124/77  118/68   Pulse: (!) 106  94   Resp: 18 17 17    Temp: 97.8 F (36.6 C)  97.8 F (36.6 C)   TempSrc: Oral  Oral   SpO2: 98%  97%   Weight:    107.7 kg  Height:        Intake/Output Summary (Last 24 hours) at 06/14/2024 1331 Last data filed at 06/14/2024 1150 Gross per 24 hour  Intake 1782.1 ml  Output 400 ml  Net 1382.1 ml      06/14/2024    5:00 AM 06/13/2024    4:28 AM 06/11/2024    5:00 AM  Last 3 Weights  Weight (lbs) 237 lb 7 oz 237 lb 3.4 oz 211 lb 6.7 oz  Weight (kg) 107.7 kg 107.6 kg 95.9 kg      Telemetry Atrial fibrillation rate controlled.- Personally Reviewed   Physical Exam  GEN: NAD Neck: supple Cardiac: irregular Respiratory: CTA GI: Soft, NT/ND MS: trace to 1+ edema; No deformity. Neuro:  grossly intact Psych: Normal affect   Labs  Recent Labs  Lab 06/10/24 0824 06/10/24 0959  TRNPT 39* 33*       Chemistry Recent Labs  Lab 06/10/24 0824 06/10/24 1436 06/11/24 0244 06/11/24 0809 06/12/24 0238 06/13/24 0249 06/14/24 0512  NA 120*   < > 126*   < > 132* 133* 134*  K 7.0*   < > 5.3*   < > 3.8 4.0 4.0  CL 91*   < > 91*   < > 94* 98 98  CO2 15*   < >  20*   < > 26 27 29   GLUCOSE 154*   < > 82   < > 84 89 88  BUN 133*   < > 120*   < > 82* 63* 41*  CREATININE 4.67*   < > 3.58*   < > 1.70* 1.26* 1.00  CALCIUM  9.1   < > 9.1   < > 8.3* 8.1* 8.3*  MG  --   --  3.0*  --  2.7* 2.7*  --   PROT 8.2*  --   --   --   --   --   --   ALBUMIN 3.1*  --   --   --   --   --   --   AST 33  --   --   --   --   --   --   ALT 29  --   --   --   --   --   --   ALKPHOS 160*  --   --   --   --   --   --  BILITOT 0.5  --   --   --   --   --   --   GFRNONAA 9*   < > 12*   < > 29* 42* 55*  ANIONGAP 15   < > 15   < > 12 8 7    < > = values in this interval not displayed.     Hematology Recent Labs  Lab 06/11/24 0440 06/12/24 0238 06/13/24 0249 06/14/24 0512  WBC 14.6* 11.7* 10.9*  --   RBC 3.21* 3.41* 3.36*  --   HGB 9.3* 9.9* 9.7* 9.4*  HCT 27.1* 29.6* 30.2* 29.2*  MCV 84.4 86.8 89.9  --   MCH 29.0 29.0 28.9  --   MCHC 34.3 33.4 32.1  --   RDW 15.1 15.3 15.5  --   PLT 241 244 227  --    Thyroid   Recent Labs  Lab 06/11/24 0244  TSH 9.530*    BNP Recent Labs  Lab 06/10/24 0824  PROBNP 6,454.0*      Patient Profile   [name] is a 84 y.o. female with a hx of longstanding persistent AF (on warfarin), PE/DVT, HFpEF, HTN, HLD, new DM2, sarcoid, OSA and obesity who is being seen for the evaluation of pericardial effusion.   Assessment & Plan  1 pericardial effusion-large on previous echocardiogram.  However no evidence of tamponade.  Question related to uremia as BUN was over 130.  Await results of limited echocardiogram performed earlier today.  2 mild cardiomyopathy-ejection fraction mildly reduced on most recent echocardiogram.  Will plan repeat study in 3 to 6 months.  If LV function remains decreased further workup could be performed at that time.  3 dehydration-patient severely dehydrated at time of admission.  Would continue to hold diuretics at this point.  4 permanent atrial fibrillation-continue carvedilol  and Cardizem   for rate control.  Will consider changing Coumadin  to apixaban if patient agreeable.  5 acute kidney injury-improving.  Continue to hold Lasix  for now.  Continue to hold ARB.  For questions or updates, please contact [facility] Please consult [email or web address] for contact info under     Signed, Redell Shallow, MD  06/14/2024, 1:31 PM    "

## 2024-06-14 NOTE — Progress Notes (Signed)
" °   06/13/24 2350  BiPAP/CPAP/SIPAP  BiPAP/CPAP/SIPAP Pt Type Adult  Reason BIPAP/CPAP not in use Non-compliant (Patient does not want to use a CPAP tonight. RT advised to call if she changes her mind.)  BiPAP/CPAP /SiPAP Vitals  Resp 17  MEWS Score/Color  MEWS Score 1  MEWS Score Color Green    "

## 2024-06-14 NOTE — Evaluation (Signed)
 Occupational Therapy Evaluation Patient Details Name: [name] MRN: [id number] DOB: February 15, 1941 Today's Date: 06/14/2024   History of Present Illness   84 yr old female admitted with AKI, pericardial effusion. PMH: Afib, PE, OSA, sarcoidosis, HF, DVT, DM, chronic back pain, chronic L hip pain, cellulitis     Clinical Impressions The pt is currently presenting below her baseline level of functioning for self care management. She is limited by the below listed deficits (see OT problem list). She is requiring CGA to min assist for most tasks, including sit to stand, lower body dressing, and simulated toileting. At her baseline, she used 3.5L O2 at night only; she was noted to be on 3L O2 via nasal cannula this date. She reported feelings of generalized weakness and she appeared to be with slight deconditioning. She will benefit from OT services to maximize her independence with ADLs and to decrease the risk for further weakness and deconditioning. The pt and her spouse are interested in short-term SNF rehab, as they do not feel as though she could safely manage her self-care tasks and mobility in the home at current, given her functional limitations.      If plan is discharge home, recommend the following:   A little help with walking and/or transfers;Assistance with cooking/housework;Assist for transportation;Help with stairs or ramp for entrance;A little help with bathing/dressing/bathroom     Functional Status Assessment   Patient has had a recent decline in their functional status and demonstrates the ability to make significant improvements in function in a reasonable and predictable amount of time.     Equipment Recommendations   None recommended by OT     Recommendations for Other Services         Precautions/Restrictions   Precautions Precaution/Restrictions Comments: monitor O2 Restrictions Weight Bearing Restrictions Per Provider Order: No      Mobility Bed Mobility Overal bed mobility: Needs Assistance Bed Mobility: Supine to Sit     Supine to sit: Contact guard Sit to supine: Supervision        Transfers Overall transfer level: Needs assistance Equipment used: Rolling walker (2 wheels) Transfers: Sit to/from Stand Sit to Stand: Contact guard assist, From elevated surface           Balance     Sitting balance-Leahy Scale: Good         Standing balance comment: CGA to min assist         ADL either performed or assessed with clinical judgement   ADL Overall ADL's : Needs assistance/impaired Eating/Feeding: Independent;Sitting   Grooming: Set up;Sitting   Upper Body Bathing: Set up;Sitting   Lower Body Bathing: Minimal assistance;Sitting/lateral leans   Upper Body Dressing : Set up;Sitting   Lower Body Dressing: Minimal assistance;Sitting/lateral leans;Sit to/from stand   Toilet Transfer: Contact guard assist;Minimal assistance;Rolling walker (2 wheels);Ambulation;Grab bars   Toileting- Clothing Manipulation and Hygiene: Minimal assistance;Sit to/from stand Toileting - Clothing Manipulation Details (indicate cue type and reason): at bathroom level, based on clinical judgement              Pertinent Vitals/Pain Pain Assessment Pain Assessment: No/denies pain     Extremity/Trunk Assessment Upper Extremity Assessment Upper Extremity Assessment: LUE deficits/detail;RUE deficits/detail RUE Deficits / Details: AROM WFL. Gross strength 4/5 to 4+/5 LUE Deficits / Details: AROM WFL. Gross strength 4/5 to 4+/5   Lower Extremity Assessment Lower Extremity Assessment: Generalized weakness;RLE deficits/detail;LLE deficits/detail RLE Deficits / Details: AROM WFL LLE Deficits / Details: AROM WFL  Communication Communication Communication: No apparent difficulties   Cognition Arousal: Alert Behavior During Therapy: WFL for tasks assessed/performed Cognition: No apparent impairments              OT - Cognition Comments: Oriented x4                 Following commands: Intact       Cueing  General Comments   Cueing Techniques: Verbal cues              Home Living Family/patient expects to be discharged to:: Private residence Living Arrangements: Spouse/significant other Available Help at Discharge: Family;Available 24 hours/day Type of Home: House Home Access: Stairs to enter [employer] of Steps: 5 Entrance Stairs-Rails: Right;Left Home Layout: One level     Bathroom Shower/Tub: Producer, Television/film/video: Handicapped height Bathroom Accessibility: Yes   Home Equipment: BSC/3in1;Wheelchair - Biomedical Scientist (2 wheels)          Prior Functioning/Environment Prior Level of Function : Independent/Modified Independent             Mobility Comments: Pt reports that recently she has been limited by hip pain however mostly Mod I with RW ADLs Comments:  (Modified independent to independent with ADLs. She shares household cooking and cleaning with her spouse, though she has had difficulty in this regard over the past few months due to L hip pain. She has not driven in ~1 year.)    OT Problem List: Decreased strength;Decreased activity tolerance;Impaired balance (sitting and/or standing)   OT Treatment/Interventions: Self-care/ADL training;Therapeutic exercise;Energy conservation;DME and/or AE instruction;Therapeutic activities;Patient/family education;Balance training      OT Goals(Current goals can be found in the care plan section)   Acute Rehab OT Goals Patient Stated Goal: to maximize functional independence OT Goal Formulation: With patient/family Time For Goal Achievement: 06/28/24 Potential to Achieve Goals: Good ADL Goals Pt Will Perform Grooming: with modified independence;standing Pt Will Perform Upper Body Dressing: with modified independence;sitting Pt Will Perform Lower Body Dressing:  with modified independence;sit to/from stand Pt Will Transfer to Toilet: with modified independence;ambulating Pt Will Perform Toileting - Clothing Manipulation and hygiene: with modified independence;sit to/from stand   OT Frequency:  Min 2X/week       AM-PAC OT 6 Clicks Daily Activity     Outcome Measure Help from another person eating meals?: None Help from another person taking care of personal grooming?: A Little Help from another person toileting, which includes using toliet, bedpan, or urinal?: A Little Help from another person bathing (including washing, rinsing, drying)?: A Little Help from another person to put on and taking off regular upper body clothing?: A Little Help from another person to put on and taking off regular lower body clothing?: A Little 6 Click Score: 19   End of Session Equipment Utilized During Treatment: Oxygen  Nurse Communication: Mobility status  Activity Tolerance: Patient tolerated treatment well Patient left: in bed;with call bell/phone within reach;with bed alarm set  OT Visit Diagnosis: Muscle weakness (generalized) (M62.81)                Time: 8381-8360 OT Time Calculation (min): 21 min Charges:  OT General Charges $OT Visit: 1 Visit OT Evaluation $OT Eval Moderate Complexity: 1 Mod    Maryon Kemnitz J Harris, OTR/L 06/14/2024, 5:39 PM

## 2024-06-14 NOTE — Hospital Course (Addendum)
 84 year old woman PMH including A-fib on warfarin, sarcoidosis, chronic lower extremity wounds, presenting with weakness, hypotension.  Workup revealed multiple abnormalities including AKI, hyperkalemia, coagulopathy, moderate pericardial effusion.  Admitted by critical care for hypotension and multiple issues.  Given FFP, vitamin K , Lokelma  and placed on bicarb infusion.  Treated with antibiotics empirically.  Consultants 1/6 admitted by [facility] Cardiology  Significant Hospital Events:  1/2 admit to ICU Blood culture 1/2 > 1 of 2 GPC, BC ID negative so far 1/2 echo: LVEF 45-50%, RV systolic function mildly reduced. RV size normal. Large pericardial effusion, no collapse of RV or RA. No concern for pericardial tamponade based on Echo.  1/2 CT abd/chest: Moderate pericardial effusion vs hemopericardium noted. (New from scan 12/4). Left lingular and left lower lobe opacities. Bilateral inguinal adenopathy 1/5 transfer to [facility]   CBG stable Creatinine within normal limits now, BUN down to 41 Hemoglobin stable at 9.4  Shock Staph epidermidis bacteremia Lower extremity wounds  Pericardial effusion Hemorrhagic, secondary to supratherapeutic INR versus uremic  Mild cardiomyopathy New HFmrEF  Follow-up as an outpatient, repeat echo in 3 to 6 months Holding diuretics  AKI Uremia Hyperkalemia  Hyponatremia Metabolic acidosis  Secondary to ischemic and prerenal insults from hypotension and prolonged decreased oral intake.  Seen by nephrology.  Treated with Lokelma  and bicarbonate infusion.  ARB held.  Acute anemia Warfarin coagulopathy Hematochezia on admission Received vitamin K  and FFP 1/2 Started on enoxaparin   Permanent atrial fibrillation Slow ventricular response on admission  IV continue carvedilol , Cardizem  Consider changing warfarin to apixaban  Hypothyroidism TSH elevated.  Continue Synthroid .  Sarcoid OSA on CPAP  Hey, Dr. Bernell seen several mentions of  changing from warfarin to Eliquis/Xarelto  for this patient so was trying to get ahead on pricing, etc. Our pharmacy team cannot run test claims because it has to be a live claim per their insurance (meaning they need an actual prescription to run it). If you're wanting to change to a DOAC, may want to send in a prescription to [facility] and I will call them once it's sent to see what the price is.

## 2024-06-14 NOTE — [place] FL2 (Signed)
 " [place]  MEDICAID FL2 LEVEL OF CARE FORM     IDENTIFICATION  Patient Name: [name] Birthdate: 1941-04-28 Sex: female Admission Date (Current Location): 06/10/2024  [facility] and [place] Number:  Producer, Television/film/video and Address:         Provider Number: [id number]  Attending Physician Name and Address:  Jadine Toribio SQUIBB, MD  Relative Name and Phone Number:  [name] Calderone(spouse) [phone]    Current Level of Care: Hospital Recommended Level of Care: Skilled Nursing Facility Prior Approval Number:    Date Approved/Denied:   PASRR Number: [id number]  Discharge Plan: SNF    Current Diagnoses: Patient Active Problem List   Diagnosis Date Noted   Generalized weakness 06/13/2024   Pericardial effusion 06/13/2024   Warfarin-induced coagulopathy 06/13/2024   Hyperkalemia 06/13/2024   AKI (acute kidney injury) 06/10/2024   Atrial fibrillation with RVR (HCC) 05/12/2024   Atypical chest pain 05/12/2024   Acute on chronic heart failure with preserved ejection fraction (HFpEF) (HCC) 09/26/2022   Permanent atrial fibrillation (HCC) 09/26/2022   Warfarin anticoagulation 09/18/2022   Sepsis (HCC) 09/18/2022   Sepsis due to cellulitis (HCC) 09/18/2022   Lower extremity cellulitis 09/18/2022   Chronic intermittent hypoxia with obstructive sleep apnea 09/12/2021   Severe obstructive sleep apnea-hypopnea syndrome 09/12/2021   Acute medial meniscus tear of right knee 03/31/2018   Acute pain of right knee 01/12/2018   Hypercoagulation syndrome 05/24/2014   Hunter's glossitis 05/24/2014   Sarcoidosis 05/24/2014   Hypoxemia 05/24/2014   Primary hypertension 10/13/2013   Nocturnal hypoxia 05/19/2013   Obstructive sleep apnea (adult) (pediatric) 05/19/2013   Obesity 05/19/2013   OSA on CPAP    Diffuse lymphadenopathy 05/26/2012   Cough 05/20/2012   OSA (obstructive sleep apnea) 05/20/2012   Pulmonary embolism (HCC) 04/29/2012   DVT (deep venous thrombosis)  (HCC) 04/29/2012    Orientation RESPIRATION BLADDER Height & Weight     Self, Time, Situation, Place  Normal Continent Weight: 107.7 kg Height:  5' 4 (162.6 cm)  BEHAVIORAL SYMPTOMS/MOOD NEUROLOGICAL BOWEL NUTRITION STATUS      Continent Diet (Renal diet)  AMBULATORY STATUS COMMUNICATION OF NEEDS Skin   Limited Assist Verbally PU Stage and Appropriate Care (legs,toes,below knees dermatitis-wound care see d/c summary)                       Personal Care Assistance Level of Assistance  Feeding, Bathing, Dressing Bathing Assistance: Limited assistance Feeding assistance: Limited assistance Dressing Assistance: Limited assistance     Functional Limitations Info  Sight, Hearing, Speech Sight Info: Impaired (eyeglasses) Hearing Info: Adequate Speech Info: Adequate    SPECIAL CARE FACTORS FREQUENCY  PT (By licensed PT), OT (By licensed OT)     PT Frequency: 5x week OT Frequency: 5x week            Contractures Contractures Info: Not present    Additional Factors Info  Code Status, Allergies Code Status Info: Full Allergies Info: Atenolol, Citalopram Hydrobromide, Clindamycin /lincomycin, Codeine, Coenzyme Q10, Erythromycin, Motrin (Ibuprofen), Statins, Latex, Losartan Potassium           Current Medications (06/14/2024):  This is the current hospital active medication list Current Facility-Administered Medications  Medication Dose Route Frequency Provider Last Rate Last Admin   acetaminophen  (TYLENOL ) tablet 1,000 mg  1,000 mg Oral Q6H PRN Kara Dorn NOVAK, MD       carvedilol  (COREG ) tablet 3.125 mg  3.125 mg Oral BID WC Floretta Mallard, MD  3.125 mg at 06/12/24 1632   Chlorhexidine  Gluconate Cloth 2 % PADS 6 each  6 each Topical Daily Kara Dorn NOVAK, MD   6 each at 06/13/24 2130   diltiazem  (CARDIZEM ) tablet 120 mg  120 mg Oral Q8H Crozier, Keven, PA-C   120 mg at 06/14/24 1335   enoxaparin  (LOVENOX ) injection 100 mg  100 mg Subcutaneous BID Jude Donning  V, MD   100 mg at 06/14/24 1017   HYDROmorphone  (DILAUDID ) injection 0.5 mg  0.5 mg Intravenous Q4H PRN Kara Dorn B, MD   0.5 mg at 06/11/24 1610   insulin  aspart (novoLOG ) injection 0-15 Units  0-15 Units Subcutaneous Q4H Rosan Deward ORN, NP   3 Units at 06/14/24 1144   levothyroxine  (SYNTHROID ) tablet 112 mcg  112 mcg Oral Q0600 Rosan Deward ORN, NP   112 mcg at 06/14/24 0501   linezolid  (ZYVOX ) IVPB 600 mg  600 mg Intravenous Q12H Shade, Christine E, RPH 20 mL/hr at 06/14/24 1150 Infusion Verify at 06/14/24 1150   LORazepam  (ATIVAN ) tablet 1.5 mg  1.5 mg Oral QHS PRN Crozier, Peyton, PA-C   1.5 mg at 06/13/24 2128   melatonin tablet 5 mg  5 mg Oral QHS PRN Haze Led, MD   5 mg at 06/13/24 0244   nystatin  (MYCOSTATIN /NYSTOP ) topical powder   Topical BID Paliwal, Aditya, MD   Given at 06/13/24 2120   ondansetron  (ZOFRAN ) injection 4 mg  4 mg Intravenous Q6H PRN Kara Dorn NOVAK, MD       Oral care mouth rinse  15 mL Mouth Rinse PRN Kara Dorn NOVAK, MD       oxyCODONE  (Oxy IR/ROXICODONE ) immediate release tablet 5 mg  5 mg Oral Q6H PRN Kara Dorn NOVAK, MD   5 mg at 06/14/24 1540   polyethylene glycol (MIRALAX  / GLYCOLAX ) packet 17 g  17 g Oral Daily PRN Dewald, Jonathan B, MD       senna (SENOKOT) tablet 8.6 mg  1 tablet Oral BID PRN Dewald, Jonathan B, MD         Discharge Medications: Please see discharge summary for a list of discharge medications.  Relevant Imaging Results:  Relevant Lab Results:   Additional Information [id number]  Romero Letizia, Nathanel, RN     "

## 2024-06-15 ENCOUNTER — Other Ambulatory Visit (HOSPITAL_COMMUNITY): Payer: Self-pay

## 2024-06-15 DIAGNOSIS — N179 Acute kidney failure, unspecified: Secondary | ICD-10-CM | POA: Diagnosis not present

## 2024-06-15 DIAGNOSIS — I3139 Other pericardial effusion (noninflammatory): Secondary | ICD-10-CM | POA: Diagnosis not present

## 2024-06-15 LAB — GLUCOSE, CAPILLARY
Glucose-Capillary: 100 mg/dL — ABNORMAL HIGH (ref 70–99)
Glucose-Capillary: 106 mg/dL — ABNORMAL HIGH (ref 70–99)
Glucose-Capillary: 124 mg/dL — ABNORMAL HIGH (ref 70–99)
Glucose-Capillary: 132 mg/dL — ABNORMAL HIGH (ref 70–99)
Glucose-Capillary: 146 mg/dL — ABNORMAL HIGH (ref 70–99)
Glucose-Capillary: 148 mg/dL — ABNORMAL HIGH (ref 70–99)

## 2024-06-15 LAB — CULTURE, BLOOD (ROUTINE X 2): Culture: NO GROWTH

## 2024-06-15 NOTE — Progress Notes (Addendum)
"   As below, patient seen and examined.  She denies dyspnea, chest pain.  Follow-up echocardiogram showed moderate pericardial effusion smaller than previous.  Continue carvedilol  and Cardizem  for rate control of atrial fibrillation.  Will change Lovenox  to apixaban 5 mg twice daily.  If this turns out to be unaffordable in the future can change back to Coumadin .  Patient to be discharged from a cardiac standpoint.  Will arrange follow-up with APP in 4 weeks.  Cardiology will sign off.  Please call with questions. Redell Shallow, MD  Progress Note  Patient Name: [name] Date of Encounter: 06/15/2024 [facility] Cardiologist: Madonna Large, DO   Interval Summary   Patient resting in chair Reviewed echo results  She reports still feeling short of breath with exertion Plans to discharge to rehab, has to choose location  Pharmacy unable to run prices due to patients insurance  Vital Signs Vitals:   06/14/24 1334 06/14/24 2014 06/15/24 0401 06/15/24 0405  BP: 107/79 115/62 111/62   Pulse: 89 83 73   Resp: (!) 21 16 16    Temp: 98.1 F (36.7 C) 98.1 F (36.7 C) 97.9 F (36.6 C)   TempSrc: Oral Oral Oral   SpO2: 96% 95% 93%   Weight:    108 kg  Height:        Intake/Output Summary (Last 24 hours) at 06/15/2024 1257 Last data filed at 06/15/2024 1058 Gross per 24 hour  Intake 677.9 ml  Output 800 ml  Net -122.1 ml      06/15/2024    4:05 AM 06/14/2024    5:00 AM 06/13/2024    4:28 AM  Last 3 Weights  Weight (lbs) 238 lb 1.6 oz 237 lb 7 oz 237 lb 3.4 oz  Weight (kg) 108 kg 107.7 kg 107.6 kg     Telemetry/ECG  Rate controlled A-Fib - Personally Reviewed  Physical Exam  GEN: No acute distress.   Neck: No JVD Cardiac: iRRR, no murmurs, rubs, or gallops.  Respiratory: Clear to auscultation bilaterally. GI: Soft, nontender, non-distended  MS: trace edema  Assessment & Plan   Pericardial effusion Noted to have large pericardial effusion, no tamponade on echo this  admission Possible uremic with BUN over 130  Follow up echo 06/14/2024: moderate pericardial effusion   Mild cardiomyopathy  LVEF 45-50% on TTE this admission  Plan to repeat echo in 3-6 months Consider further workup at that time if indicated   Permanent A-Fib Known history of A-Fib Currently on coreg  3.125 mg BID Currently on diltiazem  120 mg Q8h -- consolidate prior to discharge  Previously on warfarin, has not been on [facility] this admission - consider switching to DOAC prior to discharge - reached out to pharmacy to see if they could check prices but due to the patient's insurance they are unable to run test claims   For questions or updates, please contact [facility] Please consult [email or web address] for contact info under   Signed, Waddell DELENA Donath, PA-C   "

## 2024-06-15 NOTE — Consult Note (Signed)
 WOC Nurse re-consult Note: Refer to previous WOC consult on 1/2; Pt was admitted with multiple patchy areas of full thickness wounds to bilat legs.  Re-consult requested today and performed remotely after review of progress notes and photos in the EMR.  Bilat lower leg wounds are beginning to become more dry and shallow.  Patchy areas of full thickness wounds remain to calves; red and dry with scatted yellow and black locations.    Topical treatment revised for bedside nurses to perform as follows to promote moist healing: Apply Xeroform gauze to bilat leg wounds Q M/W/F, then cover with abd pads and kerlex, beginning just behind toes to below knees, then ace wrap in the same fashion. Moisten previous dressing with NS to assist with removal each time.  Please re-consult if further assistance is needed.  Thank-you,  Stephane Fought MSN, RN, CWOCN, CWCN-AP, CNS Contact Mon-Fri 0700-1500: [phone]

## 2024-06-15 NOTE — TOC Progression Note (Signed)
 Transition of Care [facility]) - Progression Note    Patient Details  Name: [name] MRN: [id number] Date of Birth: Apr 13, 1941  Transition of Care [facility]) CM/SW Contact  Lynda Wanninger, Nathanel, RN Phone Number: 06/15/2024, 10:19 AM  Clinical Narrative:  Will give bed offers,await choice,then [facility] Service Provider Request Status Services Address Phone Fax Patient Preferred  [facility]  Accepted -- [address], [place] [address] [phone] --  [facility]  Accepted -- [address] [place], [place] [address] [phone] --  [facility]  Accepted -- [address] [place] [address] [phone] --  [facility] Preferred SNF  Accepted -- [address], [place] [address] [phone]         Expected Discharge Plan: Skilled Nursing Facility Barriers to Discharge: Continued Medical Work up               Expected Discharge Plan and Services In-house Referral: NA Discharge Planning Services: CM Consult Post Acute Care Choice: Skilled Nursing Facility Living arrangements for the past 2 months: Single Family Home                 DME Arranged: N/A DME Agency: NA       HH Arranged: NA HH Agency: NA         Social Drivers of Health (SDOH) Interventions SDOH Screenings   Food Insecurity: No Food Insecurity (06/10/2024)  Housing: Low Risk (06/10/2024)  Transportation Needs: No Transportation Needs (06/10/2024)  Utilities: Not At Risk (06/10/2024)  Depression (PHQ2-9): Low Risk (05/27/2024)  Social Connections: Moderately Integrated (06/10/2024)  Tobacco Use: Low Risk (06/10/2024)    Readmission Risk Interventions    06/13/2024   11:12 AM 09/19/2022    2:13 PM  Readmission Risk Prevention Plan  Post Dischage Appt  Complete  Medication Screening  Complete  Transportation Screening Complete   PCP or Specialist Appt within 3-5 Days Complete   HRI or Home Care Consult Complete    Social Work Consult for Recovery Care Planning/Counseling Complete   Palliative Care Screening Not Applicable   Medication Review Oceanographer) Complete

## 2024-06-15 NOTE — Plan of Care (Signed)

## 2024-06-15 NOTE — Progress Notes (Signed)
 Mobility Specialist - Progress Note  ([place] 2L) Pre-mobility: 80 bpm HR, 93% SpO2 During mobility: 101 bpm HR, 85% SpO2 Post-mobility: 94 bpm HR, 91% SPO2   06/15/24 1256  Mobility  Activity Ambulated with assistance  Level of Assistance Minimal assist, patient does 75% or more  Assistive Device Front wheel walker  Distance Ambulated (ft) 15 ft  Range of Motion/Exercises Active  Activity Response Tolerated fair  Mobility visit 1 Mobility  Mobility Specialist Start Time (ACUTE ONLY) 1231  Mobility Specialist Stop Time (ACUTE ONLY) 1256  Mobility Specialist Time Calculation (min) (ACUTE ONLY) 25 min   Pt was found in bed and agreeable to mobilize. Grew SOB and c/o pain from soles of feet. Encouraged pursed lip breathing throughout session. At EOS was left on recliner chair with all needs met. Call bell in reach and husband in room.   Erminio Leos,  Mobility Specialist Can be reached via Secure Chat

## 2024-06-15 NOTE — Progress Notes (Signed)
 " PROGRESS NOTE    JAYLAN DUGGAR  FMW:[id number] DOB: 1941/02/17 DOA: 06/10/2024 PCP: [name], MD   Brief Narrative:  84 year old woman PMH including A-fib on warfarin, sarcoidosis, chronic lower extremity wounds presented with weakness and hypotension.  She was admitted by PCCM to ICU for AKI, hyperkalemia, coagulopathy, moderate pericardial effusion and initially received FFP, vitamin K , Lokelma  and bicarb infusion along with empiric antibiotics.  Echo showed EF of 45 to 50% with large pericardial effusion without tamponade.  Cardiology was consulted.  She was transferred to TRH service from 06/14/2023 onwards.  Assessment & Plan:   Shock: Present on admission Staph epidermidis bacteremia Lower extremity wounds - Shock is resolved.  Lower extremity wounds are looking worse today.  Will consult wound care. -Currently on empiric Zyvox .  Will repeat blood cultures for tomorrow as Staph epidermidis and initial blood culture might be a contaminant  Pericardial effusion - Cardiology following.  No signs of tamponade.  Mild cardiomyopathy Acute systolic heart failure - Strict input output.  Daily weights.  Fluid restriction.  Diuretics as per cardiology.  Continue Coreg   Leukocytosis - Resolved.  No labs today  Permanent A-fib - Continue Coreg  and Cardizem .  Rate currently controlled.  Currently on therapeutic Lovenox  twice a day.  Patient was on Coumadin  prior to presentation and had issues with apixaban in the past due to cost  AKI Uremia Hyperkalemia: Resolved Hyponatremia: Improving Acute metabolic acidosis -Secondary to ischemic and prerenal insults from hypotension and prolonged decreased oral intake. Seen by nephrology. Treated with Lokelma  and bicarbonate infusion.  -Renal function has recovered, uremia resolved, potassium within normal limits.  Nephrology has signed off.  ARB remains on hold.  Acute anemia Warfarin coagulopathy Hematochezia on admission -  Received vitamin K  and FFP on admission.  Currently hemoglobin stable.  Coumadin  on hold for now  Hypothyroidism - Continue Synthroid   OSA - Noncompliant to CPAP  Obesity class III - Outpatient follow-up  Physical deconditioning - PT recommending SNF placement.  TOC consulted  Goals of care - Overall prognosis is guarded to poor.  Consult palliative care for goals of care discussion   DVT prophylaxis: Lovenox  therapeutic Code Status: Full Family Communication: Husband at bedside Disposition Plan: Status is: Inpatient Remains inpatient appropriate because: Of severity of illness    Consultants: PCCM/cardiology.  Consult palliative care  Procedures: As above  Antimicrobials:  Anti-infectives (From admission, onward)    Start     Dose/Rate Route Frequency Ordered Stop   06/12/24 1000  linezolid  (ZYVOX ) IVPB 600 mg        600 mg 300 mL/hr over 60 Minutes Intravenous Every 12 hours 06/10/24 1709     06/11/24 1200  ceFEPIme  (MAXIPIME ) 2 g in sodium chloride  0.9 % 100 mL IVPB  Status:  Discontinued        2 g 200 mL/hr over 30 Minutes Intravenous Every 24 hours 06/10/24 1659 06/11/24 1003   06/11/24 1000  linezolid  (ZYVOX ) IVPB 600 mg  Status:  Discontinued        600 mg 300 mL/hr over 60 Minutes Intravenous Every 12 hours 06/10/24 1415 06/10/24 1709   06/10/24 1245  vancomycin  (VANCOREADY) IVPB 2000 mg/400 mL        2,000 mg 200 mL/hr over 120 Minutes Intravenous  Once 06/10/24 1204 06/10/24 1623   06/10/24 1215  ceFEPIme  (MAXIPIME ) 2 g in sodium chloride  0.9 % 100 mL IVPB        2 g 200 mL/hr over 30 Minutes  Intravenous  Once 06/10/24 1203 06/10/24 1251   06/10/24 1215  metroNIDAZOLE  (FLAGYL ) IVPB 500 mg        500 mg 100 mL/hr over 60 Minutes Intravenous  Once 06/10/24 1203 06/10/24 1430   06/10/24 1215  vancomycin  (VANCOCIN ) IVPB 1000 mg/200 mL premix  Status:  Discontinued        1,000 mg 200 mL/hr over 60 Minutes Intravenous  Once 06/10/24 1203 06/10/24 1204         Subjective: Patient seen and examined at bedside.  Does not feel well and worried about her lower extremity wounds.  No fever, abdominal pain, vomiting reported.  Objective: Vitals:   06/14/24 1334 06/14/24 2014 06/15/24 0401 06/15/24 0405  BP: 107/79 115/62 111/62   Pulse: 89 83 73   Resp: (!) 21 16 16    Temp: 98.1 F (36.7 C) 98.1 F (36.7 C) 97.9 F (36.6 C)   TempSrc: Oral Oral Oral   SpO2: 96% 95% 93%   Weight:    108 kg  Height:        Intake/Output Summary (Last 24 hours) at 06/15/2024 1126 Last data filed at 06/15/2024 1058 Gross per 24 hour  Intake 1321.77 ml  Output 800 ml  Net 521.77 ml   Filed Weights   06/13/24 0428 06/14/24 0500 06/15/24 0405  Weight: 107.6 kg 107.7 kg 108 kg    Examination:  General exam: Appears calm and comfortable  Respiratory system: Bilateral decreased breath sounds at bases with scattered crackles Cardiovascular system: S1 & S2 heard, Rate controlled Gastrointestinal system: Abdomen is morbidly obese, nondistended, soft and nontender. Normal bowel sounds heard. Extremities: No cyanosis, clubbing; bilateral lower extremity edema with chronic skin changes present  Central nervous system: Alert and oriented. No focal neurological deficits. Moving extremities Skin: Bilateral lower extremity chronic skin changes with slight erythema and some bleeding  psychiatry: Flat affect.  Not agitated.   Data Reviewed: I have personally reviewed following labs and imaging studies  CBC: Recent Labs  Lab 06/10/24 0824 06/11/24 0440 06/12/24 0238 06/13/24 0249 06/14/24 0512  WBC 9.6 14.6* 11.7* 10.9*  --   HGB 14.2 9.3* 9.9* 9.7* 9.4*  HCT 42.8 27.1* 29.6* 30.2* 29.2*  MCV 88.4 84.4 86.8 89.9  --   PLT 240 241 244 227  --    Basic Metabolic Panel: Recent Labs  Lab 06/11/24 0244 06/11/24 0809 06/11/24 1905 06/12/24 0238 06/13/24 0249 06/14/24 0512  NA 126* 127* 129* 132* 133* 134*  K 5.3* 4.7 4.0 3.8 4.0 4.0  CL 91* 91* 91*  94* 98 98  CO2 20* 23 26 26 27 29   GLUCOSE 82 80 96 84 89 88  BUN 120* 116* 103* 82* 63* 41*  CREATININE 3.58* 3.00* 2.19* 1.70* 1.26* 1.00  CALCIUM  9.1 8.6* 8.4* 8.3* 8.1* 8.3*  MG 3.0*  --   --  2.7* 2.7*  --   PHOS 6.0*  --   --  3.9 2.5  --    GFR: Estimated Creatinine Clearance: 49.3 mL/min (by C-G formula based on SCr of 1 mg/dL). Liver Function Tests: Recent Labs  Lab 06/10/24 0824  AST 33  ALT 29  ALKPHOS 160*  BILITOT 0.5  PROT 8.2*  ALBUMIN 3.1*   No results for input(s): LIPASE, AMYLASE in the last 168 hours. No results for input(s): AMMONIA in the last 168 hours. Coagulation Profile: Recent Labs  Lab 06/10/24 0824 06/11/24 0809 06/13/24 0249  INR >10.0* 2.1* 1.5*   Cardiac Enzymes: No results for  input(s): CKTOTAL, CKMB, CKMBINDEX, TROPONINI in the last 168 hours. BNP (last 3 results) Recent Labs    05/12/24 1806 06/10/24 0824  PROBNP 1,710.0* 6,454.0*   HbA1C: No results for input(s): HGBA1C in the last 72 hours. CBG: Recent Labs  Lab 06/14/24 2011 06/15/24 0010 06/15/24 0356 06/15/24 0722 06/15/24 1120  GLUCAP 143* 148* 124* 106* 146*   Lipid Profile: No results for input(s): CHOL, HDL, LDLCALC, TRIG, CHOLHDL, LDLDIRECT in the last 72 hours. Thyroid  Function Tests: No results for input(s): TSH, T4TOTAL, FREET4, T3FREE, THYROIDAB in the last 72 hours. Anemia Panel: No results for input(s): VITAMINB12, FOLATE, FERRITIN, TIBC, IRON, RETICCTPCT in the last 72 hours. Sepsis Labs: Recent Labs  Lab 06/10/24 0825  LATICACIDVEN 1.8    Recent Results (from the past 240 hours)  Blood culture (routine x 2)     Status: Abnormal   Collection Time: 06/10/24  8:17 AM   Specimen: BLOOD  Result Value Ref Range Status   Specimen Description   Final    BLOOD RIGHT ANTECUBITAL Performed at [facility], [address], [place], [place] [address]    Special Requests   Final     BOTTLES DRAWN AEROBIC AND ANAEROBIC Blood Culture adequate volume Performed at [facility], [address] [place], [place] [address]    Culture  Setup Time   Final    GRAM POSITIVE COCCI IN BOTH AEROBIC AND ANAEROBIC BOTTLES CRITICAL RESULT CALLED TO, READ BACK BY AND VERIFIED WITH: PHARMD NICK G. [id number] AT 0930, ADC    Culture (A)  Final    STAPHYLOCOCCUS EPIDERMIDIS THE SIGNIFICANCE OF ISOLATING THIS ORGANISM FROM A SINGLE SET OF BLOOD CULTURES WHEN MULTIPLE SETS ARE DRAWN IS UNCERTAIN. PLEASE NOTIFY THE MICROBIOLOGY DEPARTMENT WITHIN ONE WEEK IF SPECIATION AND SENSITIVITIES ARE REQUIRED. Performed at [facility], [address] [place], [place] [address]    Report Status 06/13/2024 FINAL  Final  Blood Culture ID Panel (Reflexed)     Status: Abnormal   Collection Time: 06/10/24  8:17 AM  Result Value Ref Range Status   Enterococcus faecalis NOT DETECTED NOT DETECTED Final   Enterococcus Faecium NOT DETECTED NOT DETECTED Final   Listeria monocytogenes NOT DETECTED NOT DETECTED Final   Staphylococcus species DETECTED (A) NOT DETECTED Final    Comment: CRITICAL RESULT CALLED TO, READ BACK BY AND VERIFIED WITH: PHARMD NICK G. [id number] AT 0930, ADC    Staphylococcus aureus (BCID) NOT DETECTED NOT DETECTED Final   Staphylococcus epidermidis DETECTED (A) NOT DETECTED Final    Comment: Methicillin (oxacillin) resistant coagulase negative staphylococcus. Possible blood culture contaminant (unless isolated from more than one blood culture draw or clinical case suggests pathogenicity). No antibiotic treatment is indicated for blood  culture contaminants. CRITICAL RESULT CALLED TO, READ BACK BY AND VERIFIED WITH: PHARMD NICK G. [id number] AT 0930, ADC    Staphylococcus lugdunensis NOT DETECTED NOT DETECTED Final   Streptococcus species NOT DETECTED NOT DETECTED Final   Streptococcus agalactiae NOT DETECTED NOT DETECTED Final   Streptococcus pneumoniae NOT DETECTED NOT  DETECTED Final   Streptococcus pyogenes NOT DETECTED NOT DETECTED Final   A.calcoaceticus-baumannii NOT DETECTED NOT DETECTED Final   Bacteroides fragilis NOT DETECTED NOT DETECTED Final   Enterobacterales NOT DETECTED NOT DETECTED Final   Enterobacter cloacae complex NOT DETECTED NOT DETECTED Final   Escherichia coli NOT DETECTED NOT DETECTED Final   Klebsiella aerogenes NOT DETECTED NOT DETECTED Final   Klebsiella oxytoca NOT DETECTED NOT DETECTED Final  Klebsiella pneumoniae NOT DETECTED NOT DETECTED Final   Proteus species NOT DETECTED NOT DETECTED Final   Salmonella species NOT DETECTED NOT DETECTED Final   Serratia marcescens NOT DETECTED NOT DETECTED Final   Haemophilus influenzae NOT DETECTED NOT DETECTED Final   Neisseria meningitidis NOT DETECTED NOT DETECTED Final   Pseudomonas aeruginosa NOT DETECTED NOT DETECTED Final   Stenotrophomonas maltophilia NOT DETECTED NOT DETECTED Final   Candida albicans NOT DETECTED NOT DETECTED Final   Candida auris NOT DETECTED NOT DETECTED Final   Candida glabrata NOT DETECTED NOT DETECTED Final   Candida krusei NOT DETECTED NOT DETECTED Final   Candida parapsilosis NOT DETECTED NOT DETECTED Final   Candida tropicalis NOT DETECTED NOT DETECTED Final   Cryptococcus neoformans/gattii NOT DETECTED NOT DETECTED Final   Methicillin resistance mecA/C DETECTED (A) NOT DETECTED Final    Comment: CRITICAL RESULT CALLED TO, READ BACK BY AND VERIFIED WITH: PHARMD NICK G. [id number] AT 0930, ADC Performed at [facility], [address], [place], [place] [address]   Respiratory (~20 pathogens) panel by PCR     Status: None   Collection Time: 06/10/24  3:27 PM   Specimen: Nasopharyngeal Swab; Respiratory  Result Value Ref Range Status   Adenovirus NOT DETECTED NOT DETECTED Final   Coronavirus 229E NOT DETECTED NOT DETECTED Final    Comment: (NOTE) The Coronavirus on the Respiratory Panel, DOES NOT test for the novel  Coronavirus (2019 nCoV)     Coronavirus HKU1 NOT DETECTED NOT DETECTED Final   Coronavirus NL63 NOT DETECTED NOT DETECTED Final   Coronavirus OC43 NOT DETECTED NOT DETECTED Final   Metapneumovirus NOT DETECTED NOT DETECTED Final   Rhinovirus / Enterovirus NOT DETECTED NOT DETECTED Final   Influenza A NOT DETECTED NOT DETECTED Final   Influenza B NOT DETECTED NOT DETECTED Final   Parainfluenza Virus 1 NOT DETECTED NOT DETECTED Final   Parainfluenza Virus 2 NOT DETECTED NOT DETECTED Final   Parainfluenza Virus 3 NOT DETECTED NOT DETECTED Final   Parainfluenza Virus 4 NOT DETECTED NOT DETECTED Final   Respiratory Syncytial Virus NOT DETECTED NOT DETECTED Final   Bordetella pertussis NOT DETECTED NOT DETECTED Final   Bordetella Parapertussis NOT DETECTED NOT DETECTED Final   Chlamydophila pneumoniae NOT DETECTED NOT DETECTED Final   Mycoplasma pneumoniae NOT DETECTED NOT DETECTED Final    Comment: Performed at [facility], [address], [place], [place] [address]  Resp panel by RT-PCR (RSV, Flu A&B, Covid) Nasal Mucosa     Status: None   Collection Time: 06/10/24  3:27 PM   Specimen: Nasal Mucosa; Nasal Swab  Result Value Ref Range Status   SARS Coronavirus 2 by RT PCR NEGATIVE NEGATIVE Final    Comment: (NOTE) SARS-CoV-2 target nucleic acids are NOT DETECTED.  The SARS-CoV-2 RNA is generally detectable in upper respiratory specimens during the acute phase of infection. The lowest concentration of SARS-CoV-2 viral copies this assay can detect is 138 copies/mL. A negative result does not preclude SARS-Cov-2 infection and should not be used as the sole basis for treatment or other patient management decisions. A negative result may occur with  improper specimen collection/handling, submission of specimen other than nasopharyngeal swab, presence of viral mutation(s) within the areas targeted by this assay, and inadequate number of viral copies(<138 copies/mL). A negative result must be combined  with clinical observations, patient history, and epidemiological information. The expected result is Negative.  Fact Sheet for Patients:  bloggercourse.com  Fact Sheet for Healthcare  Providers:  seriousbroker.it  This test is no t yet approved or cleared by the [place]  FDA and  has been authorized for detection and/or diagnosis of SARS-CoV-2 by FDA under an Emergency Use Authorization (EUA). This EUA will remain  in effect (meaning this test can be used) for the duration of the COVID-19 declaration under Section 564(b)(1) of the Act, 21 U.S.C.section 360bbb-3(b)(1), unless the authorization is terminated  or revoked sooner.       Influenza A by PCR NEGATIVE NEGATIVE Final   Influenza B by PCR NEGATIVE NEGATIVE Final    Comment: (NOTE) The Xpert Xpress SARS-CoV-2/FLU/RSV plus assay is intended as an aid in the diagnosis of influenza from Nasopharyngeal swab specimens and should not be used as a sole basis for treatment. Nasal washings and aspirates are unacceptable for Xpert Xpress SARS-CoV-2/FLU/RSV testing.  Fact Sheet for Patients: bloggercourse.com  Fact Sheet for Healthcare Providers: seriousbroker.it  This test is not yet approved or cleared by the [place]  FDA and has been authorized for detection and/or diagnosis of SARS-CoV-2 by FDA under an Emergency Use Authorization (EUA). This EUA will remain in effect (meaning this test can be used) for the duration of the COVID-19 declaration under Section 564(b)(1) of the Act, 21 U.S.C. section 360bbb-3(b)(1), unless the authorization is terminated or revoked.     Resp Syncytial Virus by PCR NEGATIVE NEGATIVE Final    Comment: (NOTE) Fact Sheet for Patients: bloggercourse.com  Fact Sheet for Healthcare Providers: seriousbroker.it  This test is not yet approved  or cleared by the [place]  FDA and has been authorized for detection and/or diagnosis of SARS-CoV-2 by FDA under an Emergency Use Authorization (EUA). This EUA will remain in effect (meaning this test can be used) for the duration of the COVID-19 declaration under Section 564(b)(1) of the Act, 21 U.S.C. section 360bbb-3(b)(1), unless the authorization is terminated or revoked.  Performed at [facility], [address], [place], [place] [address]   MRSA Next Gen by PCR, Nasal     Status: None   Collection Time: 06/10/24  3:36 PM   Specimen: Nasal Mucosa; Nasal Swab  Result Value Ref Range Status   MRSA by PCR Next Gen NOT DETECTED NOT DETECTED Final    Comment: (NOTE) The GeneXpert MRSA Assay (FDA approved for NASAL specimens only), is one component of a comprehensive MRSA colonization surveillance program. It is not intended to diagnose MRSA infection nor to guide or monitor treatment for MRSA infections. Test performance is not FDA approved in patients less than 54 years old. Performed at [facility], [address] [place], [place] [address]   Blood culture (routine x 2)     Status: None   Collection Time: 06/10/24  4:41 PM   Specimen: BLOOD LEFT HAND  Result Value Ref Range Status   Specimen Description   Final    BLOOD LEFT HAND BOTTLES DRAWN AEROBIC ONLY Performed at [facility], [address] [place], [place] [address]    Special Requests   Final    Blood Culture results may not be optimal due to an inadequate volume of blood received in culture bottles Performed at [facility], [address], [place], [place] [address]    Culture   Final    NO GROWTH 5 DAYS Performed at [facility], [address] [place], [place] [address]    Report Status 06/15/2024 FINAL  Final  Radiology Studies: ECHOCARDIOGRAM LIMITED Result Date: 06/14/2024    ECHOCARDIOGRAM LIMITED  REPORT   Patient Name:   KENDRA GRISSETT Mcgloin Date of Exam: 06/14/2024 Medical Rec #:  [id number]            Height:       64.0 in Accession #:    [id number]           Weight:       237.4 lb Date of Birth:  30-Jul-1940            BSA:          2.104 m Patient Age:    83 years             BP:           118/68 mmHg Patient Gender: F                    HR:           79 bpm. Exam Location:  Inpatient Procedure: Limited Echo, Cardiac Doppler and Color Doppler (Both Spectral and            Color Flow Doppler were utilized during procedure). Indications:    Pericardial Effusion  History:        Patient has prior history of Echocardiogram examinations, most                 recent 06/10/2024. Signs/Symptoms:Bacteremia; Risk                 Factors:Hypertension and Sleep Apnea.  Sonographer:    Philomena Daring Referring Phys: [id number] BRIAN S CRENSHAW IMPRESSIONS  1. Left ventricular ejection fraction, by estimation, is 50%. The left ventricle has mildly decreased function.  2. Right ventricular systolic function is mildly reduced. The right ventricular size is normal.  3. Moderate pericardial effusion. The pericardial effusion is posterior to the left ventricle.  4. The mitral valve is normal in structure. No evidence of mitral valve regurgitation. No evidence of mitral stenosis.  5. Aortic valve regurgitation is not visualized.  6. The inferior vena cava is dilated in size with <50% respiratory variability, suggesting right atrial pressure of 20 mmHg. Comparison(s): Prior images reviewed side by side. Pericardial effusion has improved. IVC plethora remains; respirophasic variability not optimized in this study. FINDINGS  Left Ventricle: Left ventricular ejection fraction, by estimation, is 50%. The left ventricle has mildly decreased function. Right Ventricle: The right ventricular size is normal. Right ventricular systolic function is mildly reduced. Pericardium: A moderately sized pericardial effusion is present. The pericardial  effusion is posterior to the left ventricle. Mitral Valve: The mitral valve is normal in structure. No evidence of mitral valve stenosis. Aortic Valve: Aortic valve regurgitation is not visualized. Pulmonic Valve: The pulmonic valve was not well visualized. Pulmonic valve regurgitation is not visualized. No evidence of pulmonic stenosis. Venous: The inferior vena cava is dilated in size with less than 50% respiratory variability, suggesting right atrial pressure of 15 mmHg. Additional Comments: Spectral Doppler performed. Color Doppler performed.  IVC IVC diam: 2.70 cm Stanly Leavens MD Electronically signed by Stanly Leavens MD Signature Date/Time: 06/14/2024/2:13:41 PM    Final         Scheduled Meds:  carvedilol   3.125 mg Oral BID WC   Chlorhexidine  Gluconate Cloth  6 each Topical Daily   diltiazem   120 mg Oral Q8H   enoxaparin  (LOVENOX ) injection  100 mg Subcutaneous BID   insulin  aspart  0-15 Units Subcutaneous  Q4H   levothyroxine   112 mcg Oral Q0600   nystatin    Topical BID   Continuous Infusions:  linezolid  (ZYVOX ) IV Stopped (06/15/24 1051)          Sophie Mao, MD [facility] 06/15/2024, 11:26 AM   "

## 2024-06-15 NOTE — Progress Notes (Signed)
" °   06/15/24 0011  BiPAP/CPAP/SIPAP  BiPAP/CPAP/SIPAP Pt Type Adult  Reason BIPAP/CPAP not in use Non-compliant (Pt does not want to use a CPAP tonight. Machine not in room. RT advised to call if she decides to wear it.)    "

## 2024-06-16 ENCOUNTER — Telehealth: Payer: Self-pay

## 2024-06-16 DIAGNOSIS — N179 Acute kidney failure, unspecified: Secondary | ICD-10-CM | POA: Diagnosis not present

## 2024-06-16 LAB — COMPREHENSIVE METABOLIC PANEL WITH GFR
ALT: 17 U/L (ref 0–44)
AST: 26 U/L (ref 15–41)
Albumin: 2.6 g/dL — ABNORMAL LOW (ref 3.5–5.0)
Alkaline Phosphatase: 140 U/L — ABNORMAL HIGH (ref 38–126)
Anion gap: 14 (ref 5–15)
BUN: 27 mg/dL — ABNORMAL HIGH (ref 8–23)
CO2: 23 mmol/L (ref 22–32)
Calcium: 8.7 mg/dL — ABNORMAL LOW (ref 8.9–10.3)
Chloride: 93 mmol/L — ABNORMAL LOW (ref 98–111)
Creatinine, Ser: 1.12 mg/dL — ABNORMAL HIGH (ref 0.44–1.00)
GFR, Estimated: 48 mL/min — ABNORMAL LOW
Glucose, Bld: 86 mg/dL (ref 70–99)
Potassium: 4.4 mmol/L (ref 3.5–5.1)
Sodium: 130 mmol/L — ABNORMAL LOW (ref 135–145)
Total Bilirubin: 0.8 mg/dL (ref 0.0–1.2)
Total Protein: 6.9 g/dL (ref 6.5–8.1)

## 2024-06-16 LAB — CBC
HCT: 32.6 % — ABNORMAL LOW (ref 36.0–46.0)
Hemoglobin: 10.5 g/dL — ABNORMAL LOW (ref 12.0–15.0)
MCH: 29.3 pg (ref 26.0–34.0)
MCHC: 32.2 g/dL (ref 30.0–36.0)
MCV: 91.1 fL (ref 80.0–100.0)
Platelets: 182 K/uL (ref 150–400)
RBC: 3.58 MIL/uL — ABNORMAL LOW (ref 3.87–5.11)
RDW: 16.1 % — ABNORMAL HIGH (ref 11.5–15.5)
WBC: 9.1 K/uL (ref 4.0–10.5)
nRBC: 0 % (ref 0.0–0.2)

## 2024-06-16 LAB — MAGNESIUM: Magnesium: 2.4 mg/dL (ref 1.7–2.4)

## 2024-06-16 LAB — GLUCOSE, CAPILLARY
Glucose-Capillary: 107 mg/dL — ABNORMAL HIGH (ref 70–99)
Glucose-Capillary: 114 mg/dL — ABNORMAL HIGH (ref 70–99)
Glucose-Capillary: 149 mg/dL — ABNORMAL HIGH (ref 70–99)
Glucose-Capillary: 152 mg/dL — ABNORMAL HIGH (ref 70–99)
Glucose-Capillary: 93 mg/dL (ref 70–99)
Glucose-Capillary: 98 mg/dL (ref 70–99)

## 2024-06-16 MED ORDER — LORAZEPAM 1 MG PO TABS
1.5000 mg | ORAL_TABLET | Freq: Every evening | ORAL | Status: DC | PRN
  Administered 2024-06-16 – 2024-06-19 (×4): 1.5 mg via ORAL
  Filled 2024-06-16 (×4): qty 1

## 2024-06-16 MED ORDER — ALPRAZOLAM 0.25 MG PO TABS
0.2500 mg | ORAL_TABLET | Freq: Two times a day (BID) | ORAL | Status: DC | PRN
  Administered 2024-06-16 – 2024-06-19 (×4): 0.25 mg via ORAL
  Filled 2024-06-16 (×4): qty 1

## 2024-06-16 MED ORDER — MELATONIN 5 MG PO TABS
5.0000 mg | ORAL_TABLET | Freq: Every evening | ORAL | Status: DC | PRN
  Administered 2024-06-16: 5 mg via ORAL
  Filled 2024-06-16: qty 1

## 2024-06-16 NOTE — TOC Progression Note (Signed)
 Transition of Care [facility]) - Progression Note    Patient Details  Name: [name] MRN: [id number] Date of Birth: [birth year]  [facility]) CM/SW Contact  De Libman, Nathanel, RN Phone Number: 06/16/2024, 4:31 PM  Clinical Narrative: initiated shara for [employer] rep [place] following bed available [employer].     Expected Discharge Plan: Skilled Nursing Facility Barriers to Discharge: Insurance Authorization               Expected Discharge Plan and Services In-house Referral: NA Discharge Planning Services: CM Consult Post Acute Care Choice: Skilled Nursing Facility Living arrangements for the past 2 months: Single Family Home                 DME Arranged: N/A DME Agency: NA       HH Arranged: NA HH Agency: NA         Social Drivers of Health (SDOH) Interventions SDOH Screenings   Food Insecurity: No Food Insecurity (06/10/2024)  Housing: Low Risk (06/10/2024)  Transportation Needs: No Transportation Needs (06/10/2024)  Utilities: Not At Risk (06/10/2024)  Depression (PHQ2-9): Low Risk (05/27/2024)  Social Connections: Moderately Integrated (06/10/2024)  Tobacco Use: Low Risk (06/10/2024)    Readmission Risk Interventions    06/13/2024   11:12 AM 09/19/2022    2:13 PM  Readmission Risk Prevention Plan  Post Dischage Appt  Complete  Medication Screening  Complete  Transportation Screening Complete   PCP or Specialist Appt within 3-5 Days Complete   HRI or Home Care Consult Complete   Social Work Consult for Recovery Care Planning/Counseling Complete   Palliative Care Screening Not Applicable   Medication Review Oceanographer) Complete

## 2024-06-16 NOTE — Progress Notes (Signed)
 " PROGRESS NOTE    [name]  FMW:[id number] DOB: 05/11/1941 DOA: 06/10/2024 PCP: [name], MD   Brief Narrative:  84 year old woman PMH including A-fib on warfarin, sarcoidosis, chronic lower extremity wounds presented with weakness and hypotension.  She was admitted by PCCM to ICU for AKI, hyperkalemia, coagulopathy, moderate pericardial effusion and initially received FFP, vitamin K , Lokelma  and bicarb infusion along with empiric antibiotics.  Echo showed EF of 45 to 50% with large pericardial effusion without tamponade.  Cardiology was consulted.  She was transferred to TR[facility] service from 06/14/2023 onwards.  Assessment & Plan:   Shock: Present on admission Staph epidermidis bacteremia Lower extremity wounds - Shock has resolved.   - Continue wound care-wound care consult recommendations -Currently on empiric Zyvox .  Follow-up repeat blood cultures from today as Staph epidermidis and initial blood culture might be a contaminant  Pericardial effusion - Follow-up echo on 06/14/2024 showed moderate pericardial effusion.  Cardiology signed off on 06/15/2024 and recommended outpatient follow-up with cardiology.  No signs of tamponade.  Mild cardiomyopathy Acute systolic heart failure - Strict input output.  Daily weights.  Fluid restriction.  Cardiology has not started the patient on scheduled diuretics.  Continue Coreg   Leukocytosis - Resolved.    Permanent A-fib - Continue Coreg  and Cardizem .  Rate currently controlled.  Patient has been started on Eliquis  by cardiology.  Patient was on Coumadin  prior to presentation and had issues with apixaban  in the past due to cost  AKI Uremia Hyperkalemia: Resolved Hyponatremia: Improving Acute metabolic acidosis: Resolved -Secondary to ischemic and prerenal insults from hypotension and prolonged decreased oral intake. Seen by nephrology. Treated with Lokelma  and bicarbonate infusion.  -Renal function has recovered, uremia  resolved, potassium within normal limits.  Nephrology has signed off.  ARB remains on hold.  Acute anemia Warfarin coagulopathy Hematochezia on admission - Received vitamin K  and FFP on admission.  Currently hemoglobin stable.  Coumadin  on hold for now  Hypothyroidism - Continue Synthroid   OSA - Noncompliant to CPAP  Obesity class III - Outpatient follow-up  Physical deconditioning - PT recommending SNF placement.  TOC following  Goals of care - Overall prognosis is guarded to poor.  palliative care consultation for goals of care discussion is pending   DVT prophylaxis: Eliquis   code Status: Full Family Communication: Husband at bedside Disposition Plan: Status is: Inpatient Remains inpatient appropriate because: Of severity of illness    Consultants: PCCM/cardiology.  palliative care  Procedures: As above  Antimicrobials:  Anti-infectives (From admission, onward)    Start     Dose/Rate Route Frequency Ordered Stop   06/12/24 1000  linezolid  (ZYVOX ) IVPB 600 mg        600 mg 300 mL/hr over 60 Minutes Intravenous Every 12 hours 06/10/24 1709     06/11/24 1200  ceFEPIme  (MAXIPIME ) 2 g in sodium chloride  0.9 % 100 mL IVPB  Status:  Discontinued        2 g 200 mL/hr over 30 Minutes Intravenous Every 24 hours 06/10/24 1659 06/11/24 1003   06/11/24 1000  linezolid  (ZYVOX ) IVPB 600 mg  Status:  Discontinued        600 mg 300 mL/hr over 60 Minutes Intravenous Every 12 hours 06/10/24 1415 06/10/24 1709   06/10/24 1245  vancomycin  (VANCOREADY) IVPB 2000 mg/400 mL        2,000 mg 200 mL/hr over 120 Minutes Intravenous  Once 06/10/24 1204 06/10/24 1623   06/10/24 1215  ceFEPIme  (MAXIPIME ) 2 g in sodium  chloride 0.9 % 100 mL IVPB        2 g 200 mL/hr over 30 Minutes Intravenous  Once 06/10/24 1203 06/10/24 1251   06/10/24 1215  metroNIDAZOLE  (FLAGYL ) IVPB 500 mg        500 mg 100 mL/hr over 60 Minutes Intravenous  Once 06/10/24 1203 06/10/24 1430   06/10/24 1215   vancomycin  (VANCOCIN ) IVPB 1000 mg/200 mL premix  Status:  Discontinued        1,000 mg 200 mL/hr over 60 Minutes Intravenous  Once 06/10/24 1203 06/10/24 1204        Subjective: Patient seen and examined at bedside.  No chest pain, fever, vomiting, worsening abdominal pain reported. Objective: Vitals:   06/15/24 0405 06/15/24 1332 06/15/24 1957 06/16/24 0507  BP:  114/80 130/82 119/71  Pulse:  76 89 84  Resp:  18 19 20   Temp:  (!) 97.4 F (36.3 C) 98.2 F (36.8 C) 98.6 F (37 C)  TempSrc:  Oral Oral Oral  SpO2:  94% 92% 92%  Weight: 108 kg   108.5 kg  Height:        Intake/Output Summary (Last 24 hours) at 06/16/2024 0736 Last data filed at 06/16/2024 0509 Gross per 24 hour  Intake 960 ml  Output 500 ml  Net 460 ml   Filed Weights   06/14/24 0500 06/15/24 0405 06/16/24 0507  Weight: 107.7 kg 108 kg 108.5 kg    Examination:  General: On 2 L oxygen  via nasal cannula.  No distress ENT/neck: No thyromegaly.  JVD is not elevated  respiratory: Decreased breath sounds at bases bilaterally with some crackles; no wheezing  CVS: S1-S2 heard, rate controlled currently Abdominal: Soft, morbidly obese, nontender, slightly distended; no organomegaly,  bowel sounds are heard Extremities: Bilateral lower extremities are wrapped; trace lower extremity edema; no cyanosis  CNS: Awake and alert.  No focal neurologic deficit.  Moves extremities Lymph: No obvious lymphadenopathy Skin: No obvious ecchymosis/lesions  psych: Affect is mostly flat.  Currently not agitated musculoskeletal: No obvious joint swelling/deformity    Data Reviewed: I have personally reviewed following labs and imaging studies  CBC: Recent Labs  Lab 06/10/24 0824 06/11/24 0440 06/12/24 0238 06/13/24 0249 06/14/24 0512 06/16/24 0612  WBC 9.6 14.6* 11.7* 10.9*  --  9.1  HGB 14.2 9.3* 9.9* 9.7* 9.4* 10.5*  HCT 42.8 27.1* 29.6* 30.2* 29.2* 32.6*  MCV 88.4 84.4 86.8 89.9  --  91.1  PLT 240 241 244 227  --   182   Basic Metabolic Panel: Recent Labs  Lab 06/11/24 0244 06/11/24 0809 06/11/24 1905 06/12/24 0238 06/13/24 0249 06/14/24 0512 06/16/24 0612  NA 126*   < > 129* 132* 133* 134* 130*  K 5.3*   < > 4.0 3.8 4.0 4.0 4.4  CL 91*   < > 91* 94* 98 98 93*  CO2 20*   < > 26 26 27 29 23   GLUCOSE 82   < > 96 84 89 88 86  BUN 120*   < > 103* 82* 63* 41* 27*  CREATININE 3.58*   < > 2.19* 1.70* 1.26* 1.00 1.12*  CALCIUM  9.1   < > 8.4* 8.3* 8.1* 8.3* 8.7*  MG 3.0*  --   --  2.7* 2.7*  --  2.4  PHOS 6.0*  --   --  3.9 2.5  --   --    < > = values in this interval not displayed.   GFR: Estimated Creatinine Clearance: 44.2 mL/min (  A) (by C-G formula based on SCr of 1.12 mg/dL (H)). Liver Function Tests: Recent Labs  Lab 06/10/24 0824 06/16/24 0612  AST 33 26  ALT 29 17  ALKPHOS 160* 140*  BILITOT 0.5 0.8  PROT 8.2* 6.9  ALBUMIN 3.1* 2.6*   No results for input(s): LIPASE, AMYLASE in the last 168 hours. No results for input(s): AMMONIA in the last 168 hours. Coagulation Profile: Recent Labs  Lab 06/10/24 0824 06/11/24 0809 06/13/24 0249  INR >10.0* 2.1* 1.5*   Cardiac Enzymes: No results for input(s): CKTOTAL, CKMB, CKMBINDEX, TROPONINI in the last 168 hours. BNP (last 3 results) Recent Labs    05/12/24 1806 06/10/24 0824  PROBNP 1,710.0* 6,454.0*   HbA1C: No results for input(s): HGBA1C in the last 72 hours. CBG: Recent Labs  Lab 06/15/24 1629 06/15/24 1954 06/16/24 0009 06/16/24 0327 06/16/24 0725  GLUCAP 132* 100* 107* 98 93   Lipid Profile: No results for input(s): CHOL, HDL, LDLCALC, TRIG, CHOLHDL, LDLDIRECT in the last 72 hours. Thyroid  Function Tests: No results for input(s): TSH, T4TOTAL, FREET4, T3FREE, THYROIDAB in the last 72 hours. Anemia Panel: No results for input(s): VITAMINB12, FOLATE, FERRITIN, TIBC, IRON, RETICCTPCT in the last 72 hours. Sepsis Labs: Recent Labs  Lab 06/10/24 0825   LATICACIDVEN 1.8    Recent Results (from the past 240 hours)  Blood culture (routine x 2)     Status: Abnormal   Collection Time: 06/10/24  8:17 AM   Specimen: BLOOD  Result Value Ref Range Status   Specimen Description   Final    BLOOD RIGHT ANTECUBITAL Performed at [facility], [address] [place], [place] [address]    Special Requests   Final    BOTTLES DRAWN AEROBIC AND ANAEROBIC Blood Culture adequate volume Performed at [facility], [address] [place], [place] [address]    Culture  Setup Time   Final    GRAM POSITIVE COCCI IN BOTH AEROBIC AND ANAEROBIC BOTTLES CRITICAL RESULT CALLED TO, READ BACK BY AND VERIFIED WITH: PHARMD NICK G. [id number] AT 0930, ADC    Culture (A)  Final    STAPHYLOCOCCUS EPIDERMIDIS THE SIGNIFICANCE OF ISOLATING THIS ORGANISM FROM A SINGLE SET OF BLOOD CULTURES WHEN MULTIPLE SETS ARE DRAWN IS UNCERTAIN. PLEASE NOTIFY THE MICROBIOLOGY DEPARTMENT WITHIN ONE WEEK IF SPECIATION AND SENSITIVITIES ARE REQUIRED. Performed at [facility], [address] [place], [place] [address]    Report Status 06/13/2024 FINAL  Final  Blood Culture ID Panel (Reflexed)     Status: Abnormal   Collection Time: 06/10/24  8:17 AM  Result Value Ref Range Status   Enterococcus faecalis NOT DETECTED NOT DETECTED Final   Enterococcus Faecium NOT DETECTED NOT DETECTED Final   Listeria monocytogenes NOT DETECTED NOT DETECTED Final   Staphylococcus species DETECTED (A) NOT DETECTED Final    Comment: CRITICAL RESULT CALLED TO, READ BACK BY AND VERIFIED WITH: PHARMD NICK G. [id number] AT 0930, ADC    Staphylococcus aureus (BCID) NOT DETECTED NOT DETECTED Final   Staphylococcus epidermidis DETECTED (A) NOT DETECTED Final    Comment: Methicillin (oxacillin) resistant coagulase negative staphylococcus. Possible blood culture contaminant (unless isolated from more than one blood culture draw or clinical case suggests pathogenicity). No  antibiotic treatment is indicated for blood  culture contaminants. CRITICAL RESULT CALLED TO, READ BACK BY AND VERIFIED WITH: PHARMD NICK G. [id number] AT 0930, ADC    Staphylococcus lugdunensis NOT DETECTED NOT DETECTED Final   Streptococcus species NOT  DETECTED NOT DETECTED Final   Streptococcus agalactiae NOT DETECTED NOT DETECTED Final   Streptococcus pneumoniae NOT DETECTED NOT DETECTED Final   Streptococcus pyogenes NOT DETECTED NOT DETECTED Final   A.calcoaceticus-baumannii NOT DETECTED NOT DETECTED Final   Bacteroides fragilis NOT DETECTED NOT DETECTED Final   Enterobacterales NOT DETECTED NOT DETECTED Final   Enterobacter cloacae complex NOT DETECTED NOT DETECTED Final   Escherichia coli NOT DETECTED NOT DETECTED Final   Klebsiella aerogenes NOT DETECTED NOT DETECTED Final   Klebsiella oxytoca NOT DETECTED NOT DETECTED Final   Klebsiella pneumoniae NOT DETECTED NOT DETECTED Final   Proteus species NOT DETECTED NOT DETECTED Final   Salmonella species NOT DETECTED NOT DETECTED Final   Serratia marcescens NOT DETECTED NOT DETECTED Final   Haemophilus influenzae NOT DETECTED NOT DETECTED Final   Neisseria meningitidis NOT DETECTED NOT DETECTED Final   Pseudomonas aeruginosa NOT DETECTED NOT DETECTED Final   Stenotrophomonas maltophilia NOT DETECTED NOT DETECTED Final   Candida albicans NOT DETECTED NOT DETECTED Final   Candida auris NOT DETECTED NOT DETECTED Final   Candida glabrata NOT DETECTED NOT DETECTED Final   Candida krusei NOT DETECTED NOT DETECTED Final   Candida parapsilosis NOT DETECTED NOT DETECTED Final   Candida tropicalis NOT DETECTED NOT DETECTED Final   Cryptococcus neoformans/gattii NOT DETECTED NOT DETECTED Final   Methicillin resistance mecA/C DETECTED (A) NOT DETECTED Final    Comment: CRITICAL RESULT CALLED TO, READ BACK BY AND VERIFIED WITH: PHARMD NICK G. [id number] AT 0930, ADC Performed at [facility], [address] [place], [place] [address]    Respiratory (~20 pathogens) panel by PCR     Status: None   Collection Time: 06/10/24  3:27 PM   Specimen: Nasopharyngeal Swab; Respiratory  Result Value Ref Range Status   Adenovirus NOT DETECTED NOT DETECTED Final   Coronavirus 229E NOT DETECTED NOT DETECTED Final    Comment: (NOTE) The Coronavirus on the Respiratory Panel, DOES NOT test for the novel  Coronavirus (2019 nCoV)    Coronavirus HKU1 NOT DETECTED NOT DETECTED Final   Coronavirus NL63 NOT DETECTED NOT DETECTED Final   Coronavirus OC43 NOT DETECTED NOT DETECTED Final   Metapneumovirus NOT DETECTED NOT DETECTED Final   Rhinovirus / Enterovirus NOT DETECTED NOT DETECTED Final   Influenza A NOT DETECTED NOT DETECTED Final   Influenza B NOT DETECTED NOT DETECTED Final   Parainfluenza Virus 1 NOT DETECTED NOT DETECTED Final   Parainfluenza Virus 2 NOT DETECTED NOT DETECTED Final   Parainfluenza Virus 3 NOT DETECTED NOT DETECTED Final   Parainfluenza Virus 4 NOT DETECTED NOT DETECTED Final   Respiratory Syncytial Virus NOT DETECTED NOT DETECTED Final   Bordetella pertussis NOT DETECTED NOT DETECTED Final   Bordetella Parapertussis NOT DETECTED NOT DETECTED Final   Chlamydophila pneumoniae NOT DETECTED NOT DETECTED Final   Mycoplasma pneumoniae NOT DETECTED NOT DETECTED Final    Comment: Performed at [facility], [address], [place], [place] [address]  Resp panel by RT-PCR (RSV, Flu A&B, Covid) Nasal Mucosa     Status: None   Collection Time: 06/10/24  3:27 PM   Specimen: Nasal Mucosa; Nasal Swab  Result Value Ref Range Status   SARS Coronavirus 2 by RT PCR NEGATIVE NEGATIVE Final    Comment: (NOTE) SARS-CoV-2 target nucleic acids are NOT DETECTED.  The SARS-CoV-2 RNA is generally detectable in upper respiratory specimens during the acute phase of infection. The lowest concentration of SARS-CoV-2 viral copies this assay can  detect is 138 copies/mL. A negative result does not preclude SARS-Cov-2 infection and  should not be used as the sole basis for treatment or other patient management decisions. A negative result may occur with  improper specimen collection/handling, submission of specimen other than nasopharyngeal swab, presence of viral mutation(s) within the areas targeted by this assay, and inadequate number of viral copies(<138 copies/mL). A negative result must be combined with clinical observations, patient history, and epidemiological information. The expected result is Negative.  Fact Sheet for Patients:  bloggercourse.com  Fact Sheet for Healthcare Providers:  seriousbroker.it  This test is no t yet approved or cleared by the [place]  FDA and  has been authorized for detection and/or diagnosis of SARS-CoV-2 by FDA under an Emergency Use Authorization (EUA). This EUA will remain  in effect (meaning this test can be used) for the duration of the COVID-19 declaration under Section 564(b)(1) of the Act, 21 U.S.C.section 360bbb-3(b)(1), unless the authorization is terminated  or revoked sooner.       Influenza A by PCR NEGATIVE NEGATIVE Final   Influenza B by PCR NEGATIVE NEGATIVE Final    Comment: (NOTE) The Xpert Xpress SARS-CoV-2/FLU/RSV plus assay is intended as an aid in the diagnosis of influenza from Nasopharyngeal swab specimens and should not be used as a sole basis for treatment. Nasal washings and aspirates are unacceptable for Xpert Xpress SARS-CoV-2/FLU/RSV testing.  Fact Sheet for Patients: bloggercourse.com  Fact Sheet for Healthcare Providers: seriousbroker.it  This test is not yet approved or cleared by the [place]  FDA and has been authorized for detection and/or diagnosis of SARS-CoV-2 by FDA under an Emergency Use Authorization (EUA). This EUA will remain in effect (meaning this test can be used) for the duration of the COVID-19 declaration  under Section 564(b)(1) of the Act, 21 U.S.C. section 360bbb-3(b)(1), unless the authorization is terminated or revoked.     Resp Syncytial Virus by PCR NEGATIVE NEGATIVE Final    Comment: (NOTE) Fact Sheet for Patients: bloggercourse.com  Fact Sheet for Healthcare Providers: seriousbroker.it  This test is not yet approved or cleared by the [place]  FDA and has been authorized for detection and/or diagnosis of SARS-CoV-2 by FDA under an Emergency Use Authorization (EUA). This EUA will remain in effect (meaning this test can be used) for the duration of the COVID-19 declaration under Section 564(b)(1) of the Act, 21 U.S.C. section 360bbb-3(b)(1), unless the authorization is terminated or revoked.  Performed at [facility], [address], [place], [place] [address]   MRSA Next Gen by PCR, Nasal     Status: None   Collection Time: 06/10/24  3:36 PM   Specimen: Nasal Mucosa; Nasal Swab  Result Value Ref Range Status   MRSA by PCR Next Gen NOT DETECTED NOT DETECTED Final    Comment: (NOTE) The GeneXpert MRSA Assay (FDA approved for NASAL specimens only), is one component of a comprehensive MRSA colonization surveillance program. It is not intended to diagnose MRSA infection nor to guide or monitor treatment for MRSA infections. Test performance is not FDA approved in patients less than 7 years old. Performed at [facility], [address] [place], [place] [address]   Blood culture (routine x 2)     Status: None   Collection Time: 06/10/24  4:41 PM   Specimen: BLOOD LEFT HAND  Result Value Ref Range Status   Specimen Description   Final    BLOOD LEFT HAND BOTTLES DRAWN AEROBIC ONLY Performed at [facility], [address], [place], [place] [address]    Special Requests   Final    Blood Culture results may not be optimal due to an inadequate volume of blood  received in culture bottles Performed at [facility], [address], [place], [place] [address]    Culture   Final    NO GROWTH 5 DAYS Performed at [facility], [address] [place], [place] [address]    Report Status 06/15/2024 FINAL  Final         Radiology Studies: ECHOCARDIOGRAM LIMITED Result Date: 06/14/2024    ECHOCARDIOGRAM LIMITED REPORT   Patient Name:   [name] Date of Exam: 06/14/2024 Medical Rec #:  [id number]            Height:       64.0 in Accession #:    [id number]           Weight:       237.4 lb Date of Birth:  December 11, 1940            BSA:          2.104 m Patient Age:    83 years             BP:           118/68 mmHg Patient Gender: F                    HR:           79 bpm. Exam Location:  Inpatient Procedure: Limited Echo, Cardiac Doppler and Color Doppler (Both Spectral and            Color Flow Doppler were utilized during procedure). Indications:    Pericardial Effusion  History:        Patient has prior history of Echocardiogram examinations, most                 recent 06/10/2024. Signs/Symptoms:Bacteremia; Risk                 Factors:Hypertension and Sleep Apnea.  Sonographer:    Philomena Daring Referring Phys: [id number] BRIAN S CRENSHAW IMPRESSIONS  1. Left ventricular ejection fraction, by estimation, is 50%. The left ventricle has mildly decreased function.  2. Right ventricular systolic function is mildly reduced. The right ventricular size is normal.  3. Moderate pericardial effusion. The pericardial effusion is posterior to the left ventricle.  4. The mitral valve is normal in structure. No evidence of mitral valve regurgitation. No evidence of mitral stenosis.  5. Aortic valve regurgitation is not visualized.  6. The inferior vena cava is dilated in size with <50% respiratory variability, suggesting right atrial pressure of 20 mmHg. Comparison(s): Prior images reviewed side by side. Pericardial effusion has improved. IVC plethora  remains; respirophasic variability not optimized in this study. FINDINGS  Left Ventricle: Left ventricular ejection fraction, by estimation, is 50%. The left ventricle has mildly decreased function. Right Ventricle: The right ventricular size is normal. Right ventricular systolic function is mildly reduced. Pericardium: A moderately sized pericardial effusion is present. The pericardial effusion is posterior to the left ventricle. Mitral Valve: The mitral valve is normal in structure. No evidence of mitral valve stenosis. Aortic Valve: Aortic valve regurgitation is not visualized. Pulmonic Valve: The pulmonic valve was not well visualized. Pulmonic valve regurgitation is not visualized. No evidence of pulmonic stenosis. Venous: The inferior vena cava is dilated in size with less  than 50% respiratory variability, suggesting right atrial pressure of 15 mmHg. Additional Comments: Spectral Doppler performed. Color Doppler performed.  IVC IVC diam: 2.70 cm Stanly Leavens MD Electronically signed by Stanly Leavens MD Signature Date/Time: 06/14/2024/2:13:41 PM    Final         Scheduled Meds:  carvedilol   3.125 mg Oral BID WC   Chlorhexidine  Gluconate Cloth  6 each Topical Daily   diltiazem   120 mg Oral Q8H   enoxaparin  (LOVENOX ) injection  100 mg Subcutaneous BID   insulin  aspart  0-15 Units Subcutaneous Q4H   levothyroxine   112 mcg Oral Q0600   nystatin    Topical BID   Continuous Infusions:  linezolid  (ZYVOX ) IV 600 mg (06/15/24 2200)          Sophie Mao, MD [facility] 06/16/2024, 7:36 AM   "

## 2024-06-16 NOTE — Progress Notes (Signed)
 Pt declined use of a cpap stating she does not like the hospital equipment. Pt respiratory status stable at this time w/no distress noted.   06/16/24 2100  BiPAP/CPAP/SIPAP  Reason BIPAP/CPAP not in use Non-compliant

## 2024-06-16 NOTE — Telephone Encounter (Signed)
 Sure. Thanks.   Dr. Jaedan Huttner

## 2024-06-16 NOTE — Telephone Encounter (Signed)
 Left message to call back. Pt needs appointment switched to see Dr. Floretta.

## 2024-06-16 NOTE — Progress Notes (Signed)
 Physical Therapy Treatment Patient Details Name: [name] MRN: [id number] DOB: September 13, 1940 Today's Date: 06/16/2024   History of Present Illness 84 yo female admitted with AKI, pericardial effusion. Hx of Afib, PE, OSA, sarcoidosis, HF, DVT, DM, obesity, chronic back pain, chronic L hip pain, cellulitis    PT Comments  Pt was recently D/C from [facility] last month. PT - Cognition Comments: AxO x 3 sweet Lady but feeling poorly with c/o fatigue, B LE pain and inability to have a BM.  Pt on 2 lts nasal sats 92%.  Assisted OOB was difficult.  General bed mobility comments: increased time, slow to move with c/o B LE pain, tenderness and prsented with increased dyspnea.  Pt remained on 2 lts sats decreased from 92% at rest to 88% with just sitting EOB.  Also, c/o increased cough, congestion with difficulty clearing mucus.  Required an extended seated rest break EOB before attempting transfer to [facility]. General transfer comment: required increased time and use of hands to steady self with c/o MAX fatigue and increased B LE pain.  Assisted from elevated bed to [facility] 1/4 pivot NO AD such that Pt was able to use B UE's.  Required an extended seated rest break after transfer due to increased RR and dyspnea 3/4. General Gait Details: VERY limited amb distance/tolerance this session due to increased WOB and increased c/o fatigue.  I can't go much further, stated Pt as she only amb a few feet from [facility] to the recliner with walker.  Pt remained on 2 lts sats decreased to 86% with HR 87.  Pt also presented with increased cough with poor ability to bring up on mucus.  MAX c/o fatigue.  Prior Pt was home with Spouse amb IND on her walker, MOD IND with ADL's. Prior she used oxygen  only at night, stated Pt.   LPT has rec Pt will need ST Rehab at SNF to address mobility and functional decline prior to safely returning home.    If plan is discharge home, recommend the following: A little help with walking and/or  transfers;A little help with bathing/dressing/bathroom;Assistance with cooking/housework;Assist for transportation;Help with stairs or ramp for entrance   Can travel by private vehicle     No  Equipment Recommendations  None recommended by PT    Recommendations for Other Services       Precautions / Restrictions Precautions Precautions: Fall Precaution/Restrictions Comments: monitor O2 Restrictions Weight Bearing Restrictions Per Provider Order: No     Mobility  Bed Mobility Overal bed mobility: Needs Assistance Bed Mobility: Supine to Sit     Supine to sit: Contact guard, Min assist     General bed mobility comments: increased time, slow to move with c/o B LE pain, tenderness and prsented with increased dyspnea.  Pt remained on 2 lts sats decreased from 92% at rest to 88% with just sitting EOB.  Also, c/o increased cough, congestion with difficulty clearing mucus.  Required an extended seated rest break EOB before attempting transfer to [facility].    Transfers Overall transfer level: Needs assistance Equipment used: None Transfers: Bed to chair/wheelchair/BSC   Stand pivot transfers: Contact guard assist, Min assist         General transfer comment: required increased time and use of hands to steady self with c/o MAX fatigue and increased B LE pain.  Assisted from elevated bed to [facility] 1/4 pivot NO AD such that Pt was able to use B UE's.  Required an extended seated rest break after  transfer due to increased RR and dyspnea 3/4.    Ambulation/Gait Ambulation/Gait assistance: Min assist, Mod assist Gait Distance (Feet): 2 Feet Assistive device: Rolling walker (2 wheels) Gait Pattern/deviations: Step-through pattern, Decreased stride length       General Gait Details: VERY limited amb distance/tolerance this session due to increased WOB and increased c/o fatigue.  I can't go much further, stated Pt as she only amb a few feet from [facility] to the recliner with walker.  Pt  remained on 2 lts sats decreased to 86% with HR 87.  Pt also presented with increased cough with poor ability to bring up on mucus.  MAX c/o fatigue.   Stairs             Wheelchair Mobility     Tilt Bed    Modified Rankin (Stroke Patients Only)       Balance                                            Communication Communication Communication: No apparent difficulties  Cognition   Behavior During Therapy: WFL for tasks assessed/performed   PT - Cognitive impairments: No apparent impairments                       PT - Cognition Comments: AxO x 3 sweet Lady but feelingt poorly with c/o fatigue, B LE pain and inability to have a BM. Following commands: Intact      Cueing Cueing Techniques: Verbal cues  Exercises      General Comments        Pertinent Vitals/Pain Pain Assessment Pain Assessment: Faces Faces Pain Scale: Hurts little more Pain Location: bil LEs Pain Descriptors / Indicators: Aching, Discomfort, Burning, Tender Pain Intervention(s): Monitored during session    Home Living                          Prior Function            PT Goals (current goals can now be found in the care plan section) Progress towards PT goals: Progressing toward goals    Frequency    Min 3X/week      PT Plan      Co-evaluation              AM-PAC PT 6 Clicks Mobility   Outcome Measure  Help needed turning from your back to your side while in a flat bed without using bedrails?: A Little Help needed moving from lying on your back to sitting on the side of a flat bed without using bedrails?: A Little Help needed moving to and from a bed to a chair (including a wheelchair)?: A Little Help needed standing up from a chair using your arms (e.g., wheelchair or bedside chair)?: A Little Help needed to walk in hospital room?: A Lot Help needed climbing 3-5 steps with a railing? : Total 6 Click Score: 15    End of  Session Equipment Utilized During Treatment: Gait belt Activity Tolerance: Patient limited by fatigue Patient left: in chair;with call bell/phone within reach;with chair alarm set;with nursing/sitter in room Nurse Communication: Mobility status PT Visit Diagnosis: Muscle weakness (generalized) (M62.81);Difficulty in walking, not elsewhere classified (R26.2)     Time: 8872-8848 PT Time Calculation (min) (ACUTE ONLY): 24 min  Charges:    $  Gait Training: 8-22 mins $Therapeutic Activity: 8-22 mins PT General Charges $$ ACUTE PT VISIT: 1 Visit                     Katheryn Leap  PTA Acute  Rehabilitation Services Office M-F          [phone]

## 2024-06-16 NOTE — Progress Notes (Signed)
" °   06/16/24 0126  BiPAP/CPAP/SIPAP  BiPAP/CPAP/SIPAP Pt Type Adult  Reason BIPAP/CPAP not in use Non-compliant    "

## 2024-06-16 NOTE — Consult Note (Signed)
 "                                                                                   Consultation Note Date: 06/16/2024   Patient Name: [name]  DOB: [birth year]  MRN: [id number]  Age / Sex: 84 y.o., female  PCP: [name], MD Referring Physician: Cheryle Page, MD  Reason for Consultation: Establishing goals of care  HPI/Patient Profile: 84 y.o. female  admitted on 06/10/2024    Clinical Assessment and Goals of Care: 84 year old lady who lives at home with her husband. Patient admitted to hospital medicine services cardiology services following fall.  Renal effusion mild cardiomyopathy permanent atrial fibrillation At the time of admission, patient was found to have a large pericardial effusion no evidence of tamponade on echocardiogram this was possibly related to patient's uremia with a BUN of 430 follow-up echo on 06-14-2024 with moderate pericardial effusion Patient has been seen and evaluated by cardiology she is on Coreg  diltiazem  she previously was on warfarin and is to be considered for switching over to DOAC. Repeat echocardiogram is requested in the next 3 to 6 months Palliative consultation for CODE STATUS and ongoing goals of care discussions has been requested Patient seen and examined Discussed with patient and husband present at the bedside See below  Palliative medicine is specialized medical care for people living with serious illness. It focuses on providing relief from the symptoms and stress of a serious illness. The goal is to improve quality of life for both the patient and the family. Goals of care: Broad aims of medical therapy in relation to the patient's values and preferences. Our aim is to provide medical care aimed at enabling patients to achieve the goals that matter most to them, given the circumstances of their particular medical situation and their constraints.   NEXT OF KIN Husband present at bedside they live in [place], [place]  Assessment and plan, care coordination and SUMMARY OF RECOMMENDATIONS   Full code full scope for now.  Patient does not believe she would want to be tied down to tubes and machines in such long-term however she wants resuscitation attempted and wants full code care for now Skilled nursing facility with rehabilitation attempt with palliative services to follow, and then subsequently home with home-based health care Patient request for Dr. Floretta from the cardiology service to be her outpatient cardiologist going forward. [facility] request for [facility] upon discharge.  Goals of care discussions: Palliative care has been consulted in the context of serious cardiac and renal disease to assist with CODE STATUS and goals of care discussions. I reviewed with the patient and husband about her current condition.  She was initially admitted to pulmonary critical care service to the intensive care unit for acute kidney injury hyperkalemia coagulopathy pericardial effusion.  She was subsequently transferred to hospital medicine service.  She has lower extremity wounds and was also treated for Staph epidermidis bacteremia. At this time patient elects full code full scope of care with the understanding that she would not desire prolonged dependence on machines or long-term life support if recovery were not meaningful.  Initially though,  she wishes for resuscitative efforts to be attempted and is open to revisiting these decisions as her clinical condition evolves.  Hence, the recommendation is for addition of palliative services in the outpatient setting Care coordination recommendations: Discharge to skilled nursing facility with a trial of rehabilitation with palliative services to follow   Code Status/Advance Care Planning: Full code   Symptom Management:     Palliative Prophylaxis:  Frequent Pain Assessment  Additional Recommendations (Limitations, Scope,  Preferences): Full Scope Treatment  Psycho-social/Spiritual:  Desire for further Chaplaincy support:yes Additional Recommendations: Caregiving  Support/Resources  Prognosis:  Unable to determine  Discharge Planning: Skilled Nursing Facility for rehab with Palliative care service follow-up      Primary Diagnoses: Present on Admission:  AKI (acute kidney injury)   I have reviewed the medical record, interviewed the patient and family, and examined the patient. The following aspects are pertinent.  Past Medical History:  Diagnosis Date   Anxiety    takes Lorazepam  daily as needed   Arthritis    Cancer (HCC)    thyroid    Cataracts, bilateral    immature   Chronic back pain    compressed vertebrae,takes Fosamax weekly   Family history of adverse reaction to anesthesia    granddaughter gets very sick and mean   History of blood transfusion    no abnormal reaction noted   History of bronchitis 05/2015   History of colon polyps    benign   Hyperlipidemia    takes Zetia  daily   Hypertension    takes Diltiazem  and Avapro  daily   Hypothyroidism    takes Synthroid  daily   Ischemic colitis    hx of-many yrs ago   Joint pain    Joint swelling    OSA on CPAP    05-08-12 AHi was 46, RDI  53. titrated to   9 cm water   with 3 cm flex.-nadir 75%   PE (pulmonary embolism)    takes Coumadin  daily   Peripheral edema    Pneumonia 1992   hx of   Sarcoidosis    Shortness of breath    Urinary frequency    Weakness    left leg.Numbness and tingling.Has sciatica   Wheeze    occaionally. Not new   Social History   Socioeconomic History   Marital status: Married    Spouse name: Not on file   Number of children: Not on file   Years of education: Not on file   Highest education level: Not on file  Occupational History   Occupation: Retired    Associate Professor: RETIRED  Tobacco Use   Smoking status: Never   Smokeless tobacco: Never  Vaping Use   Vaping status: Never Used   Substance and Sexual Activity   Alcohol  use: No   Drug use: No   Sexual activity: Not on file  Other Topics Concern   Not on file  Social History Narrative   Caffeine: 8 oz soda per day   Social Drivers of Health   Tobacco Use: Low Risk (06/10/2024)   Patient History    Smoking Tobacco Use: Never    Smokeless Tobacco Use: Never    Passive Exposure: Not on file  Financial Resource Strain: Not on file  Food Insecurity: No Food Insecurity (06/10/2024)   Epic    Worried About Programme Researcher, Broadcasting/film/video in the Last Year: Never true    Ran Out of Food in the Last Year: Never true  Transportation Needs: No Transportation Needs (  06/10/2024)   Epic    Lack of Transportation (Medical): No    Lack of Transportation (Non-Medical): No  Physical Activity: Not on file  Stress: Not on file  Social Connections: Moderately Integrated (06/10/2024)   Social Connection and Isolation Panel    Frequency of Communication with Friends and Family: More than three times a week    Frequency of Social Gatherings with Friends and Family: More than three times a week    Attends Religious Services: 1 to 4 times per year    Active Member of Clubs or Organizations: No    Attends Banker Meetings: Never    Marital Status: Married  Depression (PHQ2-9): Low Risk (05/27/2024)   Depression (PHQ2-9)    PHQ-2 Score: 0  Alcohol  Screen: Not on file  Housing: Low Risk (06/10/2024)   Epic    Unable to Pay for Housing in the Last Year: No    Number of Times Moved in the Last Year: 0    Homeless in the Last Year: No  Utilities: Not At Risk (06/10/2024)   Epic    Threatened with loss of utilities: No  Health Literacy: Not on file   Family History  Problem Relation Age of Onset   Breast cancer Mother    CVA Mother    Breast cancer Sister    Breast cancer Sister    Heart attack Neg Hx    Sleep apnea Neg Hx    Scheduled Meds:  carvedilol   3.125 mg Oral BID WC   diltiazem   120 mg Oral Q8H   enoxaparin  (LOVENOX )  injection  100 mg Subcutaneous BID   insulin  aspart  0-15 Units Subcutaneous Q4H   levothyroxine   112 mcg Oral Q0600   nystatin    Topical BID   Continuous Infusions:  linezolid  (ZYVOX ) IV 600 mg (06/16/24 0957)   PRN Meds:.acetaminophen , ALPRAZolam , HYDROmorphone  (DILAUDID ) injection, LORazepam , melatonin, ondansetron  (ZOFRAN ) IV, mouth rinse, oxyCODONE , polyethylene glycol, senna Medications Prior to Admission:  Prior to Admission medications  Medication Sig Start Date End Date Taking? Authorizing Provider  acetaminophen  (TYLENOL ) 325 MG tablet Take 325-650 mg by mouth 2 (two) times daily as needed for mild pain or headache.   Yes [provider]  Carboxymethylcellulose Sodium (REFRESH LIQUIGEL OP) Place 1 drop into both eyes as needed (Dry eye).   Yes [provider]  Cyanocobalamin (B-12 PO) Take 1 tablet by mouth daily.   Yes [provider]  diltiazem  (TIAZAC ) 360 MG 24 hr capsule Take 360 mg by mouth at bedtime.   Yes [provider]  furosemide  (LASIX ) 40 MG tablet Take 40 mg by mouth daily.   Yes [provider]  irbesartan  (AVAPRO ) 75 MG tablet Take 1 tablet (75 mg total) by mouth daily. 05/16/24  Yes Gonfa, Taye T, MD  levothyroxine  (SYNTHROID ) 112 MCG tablet Take 112 mcg by mouth every morning.   Yes [provider]  LORazepam  (ATIVAN ) 1 MG tablet Take 1.5 mg by mouth See admin instructions. Take 1.5 mg by mouth at bedtime and an additional 1-1.5mg  once a day as needed for anxiety   Yes [provider]  nebivolol  (BYSTOLIC ) 5 MG tablet Take 1 tablet (5 mg total) by mouth daily. 09/27/22  Yes Sheikh, Omair Latif, DO  nystatin  cream (MYCOSTATIN ) Apply 1 Application topically 2 (two) times daily as needed for dry skin. 06/07/24  Yes [provider]  senna-docusate (SENOKOT-S) 8.6-50 MG tablet Take 1-2 tablets by mouth 2 (two) times daily between meals  as needed for mild constipation or moderate constipation. 05/16/24   Yes Gonfa, Taye T, MD  SIMPLY SALINE NA Place 1 spray into both nostrils as needed (for dryness).   Yes [provider]  triamcinolone cream (KENALOG) 0.1 % Apply 1 Application topically 2 (two) times daily as needed (Irritation). 04/05/21  Yes [provider]  warfarin (COUMADIN ) 5 MG tablet Take 5 mg by mouth at bedtime. 04/25/15  Yes [provider]   Allergies[1] Review of Systems +weakness  Physical Exam Awake alert No acute distress Resting in a chair Has trace peripheral edema Abdomen is soft not distended Has clear breath sounds S1-S2  Vital Signs: BP 122/75 (BP Location: Left Arm)   Pulse 75   Temp (!) 97.5 F (36.4 C) (Oral)   Resp 20   Ht 5' 4 (1.626 m)   Wt 108.5 kg   SpO2 91%   BMI 41.06 kg/m  Pain Scale: 0-10 POSS *See Group Information*: 1-Acceptable,Awake and alert Pain Score: 0-No pain   SpO2: SpO2: 91 % O2 Device:SpO2: 91 % O2 Flow Rate: .O2 Flow Rate (L/min): 2 L/min  IO: Intake/output summary:  Intake/Output Summary (Last 24 hours) at 06/16/2024 1336 Last data filed at 06/16/2024 9490 Gross per 24 hour  Intake 660 ml  Output 500 ml  Net 160 ml    LBM: Last BM Date : 06/15/24 Baseline Weight: Weight: 109.3 kg Most recent weight: Weight: 108.5 kg     Palliative Assessment/Data:   PPS 50%  Time In:  12 Time Out:  1320 Time Total:  80 Greater than 50%  of this time was spent counseling and coordinating care related to the above assessment and plan.  Signed by: Lonia Serve, MD   Please contact Palliative Medicine Team phone at [phone] for questions and concerns.  For individual provider: See Amion                 [1]  Allergies Allergen Reactions   Atenolol Other (See Comments)    Depression    Citalopram Hydrobromide Nausea Only and Other (See Comments)    Nausea/Fatigue   Clindamycin /Lincomycin Other (See Comments)    Via IV, has caused a metallic taste in the mouth    Codeine Other (See  Comments)    Caused Hyperactivity and Dysphoria   Coenzyme Q10 Itching   Erythromycin Itching, Anxiety and Other (See Comments)    Also with jitters   Motrin [Ibuprofen] Other (See Comments)    Is to not take this   Statins Other (See Comments)    Leg muscle weakness w/ rosuvastatin and simvastatin    Latex Rash   Losartan Potassium Other (See Comments)    Muscle aches    "

## 2024-06-16 NOTE — Telephone Encounter (Signed)
 Pt would like to change providers to Dr. Floretta. Dr. Michele and Dr. Floretta, are you okay with the switch?

## 2024-06-17 DIAGNOSIS — N179 Acute kidney failure, unspecified: Secondary | ICD-10-CM | POA: Diagnosis not present

## 2024-06-17 LAB — BASIC METABOLIC PANEL WITH GFR
Anion gap: 10 (ref 5–15)
BUN: 30 mg/dL — ABNORMAL HIGH (ref 8–23)
CO2: 26 mmol/L (ref 22–32)
Calcium: 8.8 mg/dL — ABNORMAL LOW (ref 8.9–10.3)
Chloride: 91 mmol/L — ABNORMAL LOW (ref 98–111)
Creatinine, Ser: 1.22 mg/dL — ABNORMAL HIGH (ref 0.44–1.00)
GFR, Estimated: 43 mL/min — ABNORMAL LOW
Glucose, Bld: 86 mg/dL (ref 70–99)
Potassium: 4.5 mmol/L (ref 3.5–5.1)
Sodium: 127 mmol/L — ABNORMAL LOW (ref 135–145)

## 2024-06-17 LAB — GLUCOSE, CAPILLARY
Glucose-Capillary: 100 mg/dL — ABNORMAL HIGH (ref 70–99)
Glucose-Capillary: 108 mg/dL — ABNORMAL HIGH (ref 70–99)
Glucose-Capillary: 122 mg/dL — ABNORMAL HIGH (ref 70–99)
Glucose-Capillary: 122 mg/dL — ABNORMAL HIGH (ref 70–99)
Glucose-Capillary: 131 mg/dL — ABNORMAL HIGH (ref 70–99)
Glucose-Capillary: 132 mg/dL — ABNORMAL HIGH (ref 70–99)
Glucose-Capillary: 140 mg/dL — ABNORMAL HIGH (ref 70–99)

## 2024-06-17 LAB — MAGNESIUM: Magnesium: 2.5 mg/dL — ABNORMAL HIGH (ref 1.7–2.4)

## 2024-06-17 LAB — PROTIME-INR
INR: 1.5 — ABNORMAL HIGH (ref 0.8–1.2)
Prothrombin Time: 18.6 s — ABNORMAL HIGH (ref 11.4–15.2)

## 2024-06-17 MED ORDER — ENOXAPARIN SODIUM 100 MG/ML IJ SOSY
100.0000 mg | PREFILLED_SYRINGE | Freq: Two times a day (BID) | INTRAMUSCULAR | Status: DC
  Administered 2024-06-17 – 2024-06-20 (×6): 100 mg via SUBCUTANEOUS
  Filled 2024-06-17 (×6): qty 1

## 2024-06-17 MED ORDER — WARFARIN SODIUM 5 MG PO TABS
5.0000 mg | ORAL_TABLET | Freq: Once | ORAL | Status: AC
  Administered 2024-06-17: 5 mg via ORAL
  Filled 2024-06-17: qty 1

## 2024-06-17 MED ORDER — APIXABAN 5 MG PO TABS: 5.0000 mg | ORAL_TABLET | Freq: Two times a day (BID) | ORAL | Status: DC

## 2024-06-17 MED ORDER — WARFARIN - PHARMACIST DOSING INPATIENT: Freq: Every day | Status: DC

## 2024-06-17 MED ORDER — FUROSEMIDE 10 MG/ML IJ SOLN
40.0000 mg | Freq: Once | INTRAMUSCULAR | Status: AC
  Administered 2024-06-17: 40 mg via INTRAVENOUS
  Filled 2024-06-17: qty 4

## 2024-06-17 NOTE — Progress Notes (Signed)
 Occupational Therapy Treatment Patient Details Name: [name] MRN: [id number] DOB: 03-May-1941 Today's Date: 06/17/2024   History of present illness 84 yo female admitted with AKI, pericardial effusion. Hx of Afib, PE, OSA, sarcoidosis, HF, DVT, DM, obesity, chronic back pain, chronic L hip pain, cellulitis   OT comments  Pt. Seen for skilled OT treatment.  Agreeable to participation but reports gen. Anxiety.  Emotional support provided throughout session along with cues for PLB to aide in o2/anxiety management during positional changes and mobility.  Bed mobility with CGA/MIN A.  Pt. Set up seated for grooming tasks.  Sit/stand CGA from elevated surface.  Cues for hand placement and posture along with cont. PLB in prep for ambulation to recliner.  Pt. Receptive to cues for PLB and sequencing/hand placement.  Reviewed benefits of sitting up in recliner.  Pts. Spouse present at end of session.  Agree with d/c recommendations.  Cont. With acute OT POC.        If plan is discharge home, recommend the following:  A little help with walking and/or transfers;Assistance with cooking/housework;Assist for transportation;Help with stairs or ramp for entrance;A little help with bathing/dressing/bathroom   Equipment Recommendations  None recommended by OT    Recommendations for Other Services      Precautions / Restrictions Precautions Precautions: Fall Precaution/Restrictions Comments: monitor O2       Mobility Bed Mobility Overal bed mobility: Needs Assistance Bed Mobility: Supine to Sit     Supine to sit: Contact guard, Min assist, HOB elevated, Used rails     General bed mobility comments: use of RUE on bed rail to pull trunk upright, no assist for movement of BLEs towards eob.  reaching for therapist asst. wtih LUE for final scoots towards eob. cues for placing LUE on bed for support to complete final scoots.    Transfers Overall transfer level: Needs assistance Equipment  used: Rolling walker (2 wheels) Transfers: Sit to/from Stand, Bed to chair/wheelchair/BSC Sit to Stand: Contact guard assist, From elevated surface     Step pivot transfers: Contact guard assist     General transfer comment: able to stand, cues for PLB for o2/anxiety management prior to step initiation.  pt. able to take approx. 4-5 steps to recliner. cues for hand placement, able to reach bues to arm rests with a controlled sit     Balance                                           ADL either performed or assessed with clinical judgement   ADL Overall ADL's : Needs assistance/impaired     Grooming: Set up;Sitting;Brushing hair;Wash/dry face                   Toilet Transfer: Contact guard assist;Rolling walker (2 wheels);Ambulation;Cueing for sequencing Toilet Transfer Details (indicate cue type and reason): sim. with transfer from eob to recliner-cues for sequencing and hand placement during transitions from sit/stand, stand/sit         Functional mobility during ADLs: Contact guard assist      Extremity/Trunk Assessment              Vision       Perception     Praxis     Communication Communication Communication: No apparent difficulties   Cognition Arousal: Alert Behavior During Therapy: WFL for tasks assessed/performed Cognition: No apparent impairments  Following commands: Intact        Cueing   Cueing Techniques: Verbal cues  Exercises      Shoulder Instructions       General Comments      Pertinent Vitals/ Pain       Pain Assessment Pain Assessment: No/denies pain  Home Living                                          Prior Functioning/Environment              Frequency  Min 2X/week        Progress Toward Goals  OT Goals(current goals can now be found in the care plan section)  Progress towards OT goals: Progressing toward goals      Plan      Co-evaluation                 AM-PAC OT 6 Clicks Daily Activity     Outcome Measure   Help from another person eating meals?: None Help from another person taking care of personal grooming?: A Little Help from another person toileting, which includes using toliet, bedpan, or urinal?: A Little Help from another person bathing (including washing, rinsing, drying)?: A Little Help from another person to put on and taking off regular upper body clothing?: A Little Help from another person to put on and taking off regular lower body clothing?: A Little 6 Click Score: 19    End of Session Equipment Utilized During Treatment: Oxygen ;Gait belt;Rolling walker (2 wheels)  OT Visit Diagnosis: Muscle weakness (generalized) (M62.81)   Activity Tolerance Patient tolerated treatment well   Patient Left in chair;with call bell/phone within reach;with chair alarm set;with family/visitor present   Nurse Communication Mobility status        Time: 9169-9151 OT Time Calculation (min): 18 min  Charges: OT General Charges $OT Visit: 1 Visit OT Treatments $Self Care/Home Management : 8-22 mins  Randall, COTA/L Acute Rehabilitation [phone]   [name]-COTA/L  06/17/2024, 8:55 AM

## 2024-06-17 NOTE — Progress Notes (Signed)
" °   06/17/24 2324  BiPAP/CPAP/SIPAP  $ Non-Invasive Home Ventilator  Initial  $ Face Mask Large  Yes  BiPAP/CPAP/SIPAP Pt Type Adult  BiPAP/CPAP/SIPAP Resmed  Mask Type Full face mask  Dentures removed? Not applicable  Mask Size Large  Respiratory Rate 20 breaths/min  Flow Rate 2 lpm  Patient Home Machine No  Patient Home Mask No  Patient Home Tubing No  Auto Titrate Yes  Minimum cmH2O 5 cmH2O  Maximum cmH2O 15 cmH2O  Nasal massage performed Yes  CPAP/SIPAP surface wiped down Yes  Device Plugged into RED Power Outlet Yes  BiPAP/CPAP /SiPAP Vitals  Pulse Rate 71  Resp 20  SpO2 91 %  Bilateral Breath Sounds Clear;Diminished  MEWS Score/Color  MEWS Score 0  MEWS Score Color Green    "

## 2024-06-17 NOTE — Progress Notes (Signed)
 " PROGRESS NOTE    MOE BRIER  FMW:[id number] DOB: [birth year] DOA: 06/10/2024 PCP: [name], MD   Brief Narrative:  84 year old woman PMH including A-fib on warfarin, sarcoidosis, chronic lower extremity wounds presented with weakness and hypotension.  She was admitted by PCCM to ICU for AKI, hyperkalemia, coagulopathy, moderate pericardial effusion and initially received FFP, vitamin K , Lokelma  and bicarb infusion along with empiric antibiotics.  Echo showed EF of 45 to 50% with large pericardial effusion without tamponade.  Cardiology was consulted.  She was transferred to TRH service from 06/14/2023 onwards.  Assessment & Plan:   Shock: Present on admission Staph epidermidis bacteremia Lower extremity wounds - Shock has resolved.   - Continue wound care-wound care consult recommendations -Currently on empiric Zyvox .  Follow-up repeat blood cultures from 06/16/24 are negative so far  Pericardial effusion - Follow-up echo on 06/14/2024 showed moderate pericardial effusion.  Cardiology signed off on 06/15/2024 and recommended outpatient follow-up with cardiology.  No signs of tamponade.  Mild cardiomyopathy Acute systolic heart failure - Strict input output.  Daily weights.  Fluid restriction.  Cardiology has not started the patient on scheduled diuretics.  Continue Coreg   Leukocytosis - Resolved.    Permanent A-fib - Continue Coreg  and Cardizem .  Rate currently controlled.  Patient has been started on Eliquis  by cardiology.  Patient was on Coumadin  prior to presentation and had issues with apixaban  in the past due to cost  AKI Uremia Hyperkalemia: Resolved Acute metabolic acidosis: Resolved -Secondary to ischemic and prerenal insults from hypotension and prolonged decreased oral intake. Seen by nephrology. Treated with Lokelma  and bicarbonate infusion.  -Renal function has recovered, uremia resolved, potassium within normal limits.  Nephrology has signed off.  ARB  remains on hold.  Hyponatremia - Sodium worsened to 127 today.  Possibly from volume overload.  Will start oral diuretics.  Monitor  Acute anemia Warfarin coagulopathy Hematochezia on admission - Received vitamin K  and FFP on admission.  Currently hemoglobin stable.  Coumadin  on hold for now  Hypothyroidism - Continue Synthroid   OSA - Noncompliant to CPAP  Obesity class III - Outpatient follow-up  Physical deconditioning - PT recommending SNF placement.  TOC following  Goals of care - Overall prognosis is guarded to poor.  palliative care consultation appreciated.  Patient remains full code.  DVT prophylaxis: Eliquis   code Status: Full Family Communication: Husband at bedside on 06/16/2024 Disposition Plan: Status is: Inpatient Remains inpatient appropriate because: Of severity of illness.  Need for SNF placement    Consultants: PCCM/cardiology.  palliative care  Procedures: As above  Antimicrobials:  Anti-infectives (From admission, onward)    Start     Dose/Rate Route Frequency Ordered Stop   06/12/24 1000  linezolid  (ZYVOX ) IVPB 600 mg        600 mg 300 mL/hr over 60 Minutes Intravenous Every 12 hours 06/10/24 1709     06/11/24 1200  ceFEPIme  (MAXIPIME ) 2 g in sodium chloride  0.9 % 100 mL IVPB  Status:  Discontinued        2 g 200 mL/hr over 30 Minutes Intravenous Every 24 hours 06/10/24 1659 06/11/24 1003   06/11/24 1000  linezolid  (ZYVOX ) IVPB 600 mg  Status:  Discontinued        600 mg 300 mL/hr over 60 Minutes Intravenous Every 12 hours 06/10/24 1415 06/10/24 1709   06/10/24 1245  vancomycin  (VANCOREADY) IVPB 2000 mg/400 mL        2,000 mg 200 mL/hr over 120 Minutes Intravenous  Once 06/10/24 1204 06/10/24 1623   06/10/24 1215  ceFEPIme  (MAXIPIME ) 2 g in sodium chloride  0.9 % 100 mL IVPB        2 g 200 mL/hr over 30 Minutes Intravenous  Once 06/10/24 1203 06/10/24 1251   06/10/24 1215  metroNIDAZOLE  (FLAGYL ) IVPB 500 mg        500 mg 100 mL/hr over 60  Minutes Intravenous  Once 06/10/24 1203 06/10/24 1430   06/10/24 1215  vancomycin  (VANCOCIN ) IVPB 1000 mg/200 mL premix  Status:  Discontinued        1,000 mg 200 mL/hr over 60 Minutes Intravenous  Once 06/10/24 1203 06/10/24 1204        Subjective: Patient seen and examined at bedside.  Denies worsening shortness of breath, fever or vomiting.  Feels anxious. Objective: Vitals:   06/16/24 2009 06/17/24 0000 06/17/24 0431 06/17/24 0500  BP: 120/67  129/73   Pulse: 66  72   Resp: 18 16 19    Temp: (!) 97.5 F (36.4 C)  98.2 F (36.8 C)   TempSrc: Oral  Oral   SpO2: 91%  90%   Weight:    111 kg  Height:        Intake/Output Summary (Last 24 hours) at 06/17/2024 0727 Last data filed at 06/17/2024 0500 Gross per 24 hour  Intake 1134 ml  Output 475 ml  Net 659 ml   Filed Weights   06/15/24 0405 06/16/24 0507 06/17/24 0500  Weight: 108 kg 108.5 kg 111 kg    Examination:  General: No acute distress.  Currently on 2 L oxygen  via nasal cannula ENT/neck: No JVD elevation or palpable masses respiratory: Bilateral decreased breath sounds at bases with scattered crackles CVS: Rate mostly controlled; S1 and S2 are Abdominal: Soft, morbidly obese, nontender, distended mildly; no organomegaly,  bowel sounds are heard normally Extremities: Bilateral lower extremities are wrapped; no clubbing, lower extremity edema present CNS: Alert and awake; slow to respond.  No focal neurologic deficit.  Able to move extremities Lymph: No obvious palpable lymphadenopathy Skin: No obvious petechiae/rashes psych: Flat affect.  Not agitated currently.  Intermittently anxious looking musculoskeletal: No obvious joint tenderness/erythema   Data Reviewed: I have personally reviewed following labs and imaging studies  CBC: Recent Labs  Lab 06/10/24 0824 06/11/24 0440 06/12/24 0238 06/13/24 0249 06/14/24 0512 06/16/24 0612  WBC 9.6 14.6* 11.7* 10.9*  --  9.1  HGB 14.2 9.3* 9.9* 9.7* 9.4* 10.5*   HCT 42.8 27.1* 29.6* 30.2* 29.2* 32.6*  MCV 88.4 84.4 86.8 89.9  --  91.1  PLT 240 241 244 227  --  182   Basic Metabolic Panel: Recent Labs  Lab 06/11/24 0244 06/11/24 0809 06/12/24 0238 06/13/24 0249 06/14/24 0512 06/16/24 0612 06/17/24 0556  NA 126*   < > 132* 133* 134* 130* 127*  K 5.3*   < > 3.8 4.0 4.0 4.4 4.5  CL 91*   < > 94* 98 98 93* 91*  CO2 20*   < > 26 27 29 23 26   GLUCOSE 82   < > 84 89 88 86 86  BUN 120*   < > 82* 63* 41* 27* 30*  CREATININE 3.58*   < > 1.70* 1.26* 1.00 1.12* 1.22*  CALCIUM  9.1   < > 8.3* 8.1* 8.3* 8.7* 8.8*  MG 3.0*  --  2.7* 2.7*  --  2.4 2.5*  PHOS 6.0*  --  3.9 2.5  --   --   --    < > =  values in this interval not displayed.   GFR: Estimated Creatinine Clearance: 41.1 mL/min (A) (by C-G formula based on SCr of 1.22 mg/dL (H)). Liver Function Tests: Recent Labs  Lab 06/10/24 0824 06/16/24 0612  AST 33 26  ALT 29 17  ALKPHOS 160* 140*  BILITOT 0.5 0.8  PROT 8.2* 6.9  ALBUMIN 3.1* 2.6*   No results for input(s): LIPASE, AMYLASE in the last 168 hours. No results for input(s): AMMONIA in the last 168 hours. Coagulation Profile: Recent Labs  Lab 06/10/24 0824 06/11/24 0809 06/13/24 0249  INR >10.0* 2.1* 1.5*   Cardiac Enzymes: No results for input(s): CKTOTAL, CKMB, CKMBINDEX, TROPONINI in the last 168 hours. BNP (last 3 results) Recent Labs    05/12/24 1806 06/10/24 0824  PROBNP 1,710.0* 6,454.0*   HbA1C: No results for input(s): HGBA1C in the last 72 hours. CBG: Recent Labs  Lab 06/16/24 1150 06/16/24 1616 06/16/24 2006 06/17/24 0000 06/17/24 0428  GLUCAP 149* 152* 114* 132* 108*   Lipid Profile: No results for input(s): CHOL, HDL, LDLCALC, TRIG, CHOLHDL, LDLDIRECT in the last 72 hours. Thyroid  Function Tests: No results for input(s): TSH, T4TOTAL, FREET4, T3FREE, THYROIDAB in the last 72 hours. Anemia Panel: No results for input(s): VITAMINB12, FOLATE, FERRITIN,  TIBC, IRON, RETICCTPCT in the last 72 hours. Sepsis Labs: Recent Labs  Lab 06/10/24 0825  LATICACIDVEN 1.8    Recent Results (from the past 240 hours)  Blood culture (routine x 2)     Status: Abnormal   Collection Time: 06/10/24  8:17 AM   Specimen: BLOOD  Result Value Ref Range Status   Specimen Description   Final    BLOOD RIGHT ANTECUBITAL Performed at [facility], [address], [place], [place] [address]    Special Requests   Final    BOTTLES DRAWN AEROBIC AND ANAEROBIC Blood Culture adequate volume Performed at [facility], [address], [place], [place] [address]    Culture  Setup Time   Final    GRAM POSITIVE COCCI IN BOTH AEROBIC AND ANAEROBIC BOTTLES CRITICAL RESULT CALLED TO, READ BACK BY AND VERIFIED WITH: PHARMD NICK G. [id number] AT 0930, ADC    Culture (A)  Final    STAPHYLOCOCCUS EPIDERMIDIS THE SIGNIFICANCE OF ISOLATING THIS ORGANISM FROM A SINGLE SET OF BLOOD CULTURES WHEN MULTIPLE SETS ARE DRAWN IS UNCERTAIN. PLEASE NOTIFY THE MICROBIOLOGY DEPARTMENT WITHIN ONE WEEK IF SPECIATION AND SENSITIVITIES ARE REQUIRED. Performed at [facility], [address], [place], [place] [address]    Report Status 06/13/2024 FINAL  Final  Blood Culture ID Panel (Reflexed)     Status: Abnormal   Collection Time: 06/10/24  8:17 AM  Result Value Ref Range Status   Enterococcus faecalis NOT DETECTED NOT DETECTED Final   Enterococcus Faecium NOT DETECTED NOT DETECTED Final   Listeria monocytogenes NOT DETECTED NOT DETECTED Final   Staphylococcus species DETECTED (A) NOT DETECTED Final    Comment: CRITICAL RESULT CALLED TO, READ BACK BY AND VERIFIED WITH: PHARMD NICK G. [id number] AT 0930, ADC    Staphylococcus aureus (BCID) NOT DETECTED NOT DETECTED Final   Staphylococcus epidermidis DETECTED (A) NOT DETECTED Final    Comment: Methicillin (oxacillin) resistant coagulase negative staphylococcus. Possible blood culture contaminant  (unless isolated from more than one blood culture draw or clinical case suggests pathogenicity). No antibiotic treatment is indicated for blood  culture contaminants. CRITICAL RESULT CALLED TO, READ BACK BY AND VERIFIED WITH: PHARMD NICK G. [id number] AT 0930, ADC  Staphylococcus lugdunensis NOT DETECTED NOT DETECTED Final   Streptococcus species NOT DETECTED NOT DETECTED Final   Streptococcus agalactiae NOT DETECTED NOT DETECTED Final   Streptococcus pneumoniae NOT DETECTED NOT DETECTED Final   Streptococcus pyogenes NOT DETECTED NOT DETECTED Final   A.calcoaceticus-baumannii NOT DETECTED NOT DETECTED Final   Bacteroides fragilis NOT DETECTED NOT DETECTED Final   Enterobacterales NOT DETECTED NOT DETECTED Final   Enterobacter cloacae complex NOT DETECTED NOT DETECTED Final   Escherichia coli NOT DETECTED NOT DETECTED Final   Klebsiella aerogenes NOT DETECTED NOT DETECTED Final   Klebsiella oxytoca NOT DETECTED NOT DETECTED Final   Klebsiella pneumoniae NOT DETECTED NOT DETECTED Final   Proteus species NOT DETECTED NOT DETECTED Final   Salmonella species NOT DETECTED NOT DETECTED Final   Serratia marcescens NOT DETECTED NOT DETECTED Final   Haemophilus influenzae NOT DETECTED NOT DETECTED Final   Neisseria meningitidis NOT DETECTED NOT DETECTED Final   Pseudomonas aeruginosa NOT DETECTED NOT DETECTED Final   Stenotrophomonas maltophilia NOT DETECTED NOT DETECTED Final   Candida albicans NOT DETECTED NOT DETECTED Final   Candida auris NOT DETECTED NOT DETECTED Final   Candida glabrata NOT DETECTED NOT DETECTED Final   Candida krusei NOT DETECTED NOT DETECTED Final   Candida parapsilosis NOT DETECTED NOT DETECTED Final   Candida tropicalis NOT DETECTED NOT DETECTED Final   Cryptococcus neoformans/gattii NOT DETECTED NOT DETECTED Final   Methicillin resistance mecA/C DETECTED (A) NOT DETECTED Final    Comment: CRITICAL RESULT CALLED TO, READ BACK BY AND VERIFIED WITH: PHARMD NICK G.  [id number] AT 0930, ADC Performed at [facility], [address], [place], [place] [address]   Respiratory (~20 pathogens) panel by PCR     Status: None   Collection Time: 06/10/24  3:27 PM   Specimen: Nasopharyngeal Swab; Respiratory  Result Value Ref Range Status   Adenovirus NOT DETECTED NOT DETECTED Final   Coronavirus 229E NOT DETECTED NOT DETECTED Final    Comment: (NOTE) The Coronavirus on the Respiratory Panel, DOES NOT test for the novel  Coronavirus (2019 nCoV)    Coronavirus HKU1 NOT DETECTED NOT DETECTED Final   Coronavirus NL63 NOT DETECTED NOT DETECTED Final   Coronavirus OC43 NOT DETECTED NOT DETECTED Final   Metapneumovirus NOT DETECTED NOT DETECTED Final   Rhinovirus / Enterovirus NOT DETECTED NOT DETECTED Final   Influenza A NOT DETECTED NOT DETECTED Final   Influenza B NOT DETECTED NOT DETECTED Final   Parainfluenza Virus 1 NOT DETECTED NOT DETECTED Final   Parainfluenza Virus 2 NOT DETECTED NOT DETECTED Final   Parainfluenza Virus 3 NOT DETECTED NOT DETECTED Final   Parainfluenza Virus 4 NOT DETECTED NOT DETECTED Final   Respiratory Syncytial Virus NOT DETECTED NOT DETECTED Final   Bordetella pertussis NOT DETECTED NOT DETECTED Final   Bordetella Parapertussis NOT DETECTED NOT DETECTED Final   Chlamydophila pneumoniae NOT DETECTED NOT DETECTED Final   Mycoplasma pneumoniae NOT DETECTED NOT DETECTED Final    Comment: Performed at [facility], [address], [place], [place] [address]  Resp panel by RT-PCR (RSV, Flu A&B, Covid) Nasal Mucosa     Status: None   Collection Time: 06/10/24  3:27 PM   Specimen: Nasal Mucosa; Nasal Swab  Result Value Ref Range Status   SARS Coronavirus 2 by RT PCR NEGATIVE NEGATIVE Final    Comment: (NOTE) SARS-CoV-2 target nucleic acids are NOT DETECTED.  The SARS-CoV-2 RNA is generally detectable in upper respiratory specimens during the acute phase  of infection. The lowest concentration of SARS-CoV-2 viral copies this  assay can detect is 138 copies/mL. A negative result does not preclude SARS-Cov-2 infection and should not be used as the sole basis for treatment or other patient management decisions. A negative result may occur with  improper specimen collection/handling, submission of specimen other than nasopharyngeal swab, presence of viral mutation(s) within the areas targeted by this assay, and inadequate number of viral copies(<138 copies/mL). A negative result must be combined with clinical observations, patient history, and epidemiological information. The expected result is Negative.  Fact Sheet for Patients:  bloggercourse.com  Fact Sheet for Healthcare Providers:  seriousbroker.it  This test is no t yet approved or cleared by the [place]  FDA and  has been authorized for detection and/or diagnosis of SARS-CoV-2 by FDA under an Emergency Use Authorization (EUA). This EUA will remain  in effect (meaning this test can be used) for the duration of the COVID-19 declaration under Section 564(b)(1) of the Act, 21 U.S.C.section 360bbb-3(b)(1), unless the authorization is terminated  or revoked sooner.       Influenza A by PCR NEGATIVE NEGATIVE Final   Influenza B by PCR NEGATIVE NEGATIVE Final    Comment: (NOTE) The Xpert Xpress SARS-CoV-2/FLU/RSV plus assay is intended as an aid in the diagnosis of influenza from Nasopharyngeal swab specimens and should not be used as a sole basis for treatment. Nasal washings and aspirates are unacceptable for Xpert Xpress SARS-CoV-2/FLU/RSV testing.  Fact Sheet for Patients: bloggercourse.com  Fact Sheet for Healthcare Providers: seriousbroker.it  This test is not yet approved or cleared by the [place]  FDA and has been authorized for detection and/or diagnosis of SARS-CoV-2 by FDA under an Emergency Use Authorization (EUA). This EUA will  remain in effect (meaning this test can be used) for the duration of the COVID-19 declaration under Section 564(b)(1) of the Act, 21 U.S.C. section 360bbb-3(b)(1), unless the authorization is terminated or revoked.     Resp Syncytial Virus by PCR NEGATIVE NEGATIVE Final    Comment: (NOTE) Fact Sheet for Patients: bloggercourse.com  Fact Sheet for Healthcare Providers: seriousbroker.it  This test is not yet approved or cleared by the [place]  FDA and has been authorized for detection and/or diagnosis of SARS-CoV-2 by FDA under an Emergency Use Authorization (EUA). This EUA will remain in effect (meaning this test can be used) for the duration of the COVID-19 declaration under Section 564(b)(1) of the Act, 21 U.S.C. section 360bbb-3(b)(1), unless the authorization is terminated or revoked.  Performed at [facility], [address] [place], [place] [address]   MRSA Next Gen by PCR, Nasal     Status: None   Collection Time: 06/10/24  3:36 PM   Specimen: Nasal Mucosa; Nasal Swab  Result Value Ref Range Status   MRSA by PCR Next Gen NOT DETECTED NOT DETECTED Final    Comment: (NOTE) The GeneXpert MRSA Assay (FDA approved for NASAL specimens only), is one component of a comprehensive MRSA colonization surveillance program. It is not intended to diagnose MRSA infection nor to guide or monitor treatment for MRSA infections. Test performance is not FDA approved in patients less than 32 years old. Performed at [facility], [address], [place], [place] [address]   Blood culture (routine x 2)     Status: None   Collection Time: 06/10/24  4:41 PM   Specimen: BLOOD LEFT HAND  Result Value Ref Range Status   Specimen Description   Final  BLOOD LEFT HAND BOTTLES DRAWN AEROBIC ONLY Performed at [facility], [address] [place], [place] [address]    Special  Requests   Final    Blood Culture results may not be optimal due to an inadequate volume of blood received in culture bottles Performed at [facility], [address], [place], [place] [address]    Culture   Final    NO GROWTH 5 DAYS Performed at [facility], [address], [place], [place] [address]    Report Status 06/15/2024 FINAL  Final  Culture, blood (Routine X 2) w Reflex to ID Panel     Status: None (Preliminary result)   Collection Time: 06/16/24  2:22 PM   Specimen: BLOOD LEFT HAND  Result Value Ref Range Status   Specimen Description   Final    BLOOD LEFT HAND Performed at [facility], [address], [place], [place] [address]    Special Requests   Final    BOTTLES DRAWN AEROBIC AND ANAEROBIC Blood Culture adequate volume Performed at [facility], [address], [place], [place] [address]    Culture PENDING  Incomplete   Report Status PENDING  Incomplete  Culture, blood (Routine X 2) w Reflex to ID Panel     Status: None (Preliminary result)   Collection Time: 06/16/24  2:27 PM   Specimen: BLOOD LEFT HAND  Result Value Ref Range Status   Specimen Description   Final    BLOOD LEFT HAND Performed at [facility], [address], [place], [place] [address]    Special Requests   Final    BOTTLES DRAWN AEROBIC AND ANAEROBIC Blood Culture adequate volume Performed at [facility], [address], [place], [place] [address]    Culture PENDING  Incomplete   Report Status PENDING  Incomplete         Radiology Studies: No results found.       Scheduled Meds:  carvedilol   3.125 mg Oral BID WC   diltiazem   120 mg Oral Q8H   enoxaparin  (LOVENOX ) injection  100 mg Subcutaneous BID   insulin  aspart  0-15 Units Subcutaneous Q4H   levothyroxine   112 mcg Oral Q0600   nystatin    Topical BID   Continuous Infusions:  linezolid  (ZYVOX ) IV 600 mg (06/16/24 2202)          Sophie Mao, MD [facility] 06/17/2024, 7:27 AM   "

## 2024-06-17 NOTE — Progress Notes (Signed)
 Pt presented some anxiety and frequently called. She requested Xanax  at bedtime.   She is alert and fully oriented x 4, afebrile, hemodynamically stable, Atrial fibrillation on the monitor, HR under controlled 50s-60s, on 2 LPM of NCL, normal respiratory effort, no acute distress noted.  Pt is able to rest an sleep well after nursing interventions with no major complaints. Plan of care is reviewed. Pt has been progressing. We will continue to monitor.  Wendi Dash, RN

## 2024-06-17 NOTE — Progress Notes (Addendum)
 PHARMACY - ANTICOAGULATION CONSULT NOTE  Pharmacy Consult for warfarin Indication: atrial fibrillation, PE  Allergies[1]  Patient Measurements: Height: 5' 4 (162.6 cm) Weight: 111 kg (244 lb 12.8 oz) IBW/kg (Calculated) : 54.7 HEPARIN DW (KG): 80.7  Vital Signs: Temp: 98 F (36.7 C) (01/09 0907) Temp Source: Oral (01/09 0907) BP: 126/75 (01/09 0907) Pulse Rate: 78 (01/09 0907)  Labs: Recent Labs    06/16/24 0612 06/17/24 0556  HGB 10.5*  --   HCT 32.6*  --   PLT 182  --   CREATININE 1.12* 1.22*    Estimated Creatinine Clearance: 41.1 mL/min (A) (by C-G formula based on SCr of 1.22 mg/dL (H)).   Medical History: Past Medical History:  Diagnosis Date   Anxiety    takes Lorazepam  daily as needed   Arthritis    Cancer (HCC)    thyroid    Cataracts, bilateral    immature   Chronic back pain    compressed vertebrae,takes Fosamax weekly   Family history of adverse reaction to anesthesia    granddaughter gets very sick and mean   History of blood transfusion    no abnormal reaction noted   History of bronchitis 05/2015   History of colon polyps    benign   Hyperlipidemia    takes Zetia  daily   Hypertension    takes Diltiazem  and Avapro  daily   Hypothyroidism    takes Synthroid  daily   Ischemic colitis    hx of-many yrs ago   Joint pain    Joint swelling    OSA on CPAP    05-08-12 AHi was 46, RDI  53. titrated to   9 cm water   with 3 cm flex.-nadir 75%   PE (pulmonary embolism)    takes Coumadin  daily   Peripheral edema    Pneumonia 1992   hx of   Sarcoidosis    Shortness of breath    Urinary frequency    Weakness    left leg.Numbness and tingling.Has sciatica   Wheeze    occaionally. Not new    Medications: Pt chronically anticoagulated with warfarin -Home dose: warfarin 5 mg PO daily -Last dose: 1/1 or 1/2 PTA  Assessment: Pt is an 3 yoF who is chronically anticoagulated with warfarin for history of afib, PE. Pt presented on 1/2 with  hypotension, pericardial effusion, supratherapeutic INR.   Warfarin has been held since admission, pt has been on therapeutic enoxaparin  since 1/5. Cardiology recommended transition from warfarin to apixaban , however, pt/spouse have concerns about switching anticoagulant due to it being cost-prohibitive and having to meet deductible. Pharmacy consulted to resume warfarin on 1/9.   Significant Events: -1/2: CBC WNL, INR >10. Vitamin K  5 mg IV x1 given. Transfused -1/5  Therapeutic LMWH started -1/9  Resume warfarin, continue LMWH bridge  Today, 06/17/2024 INR = 1.5 is subtherapeutic after holding warfarin CBC: Hgb low but improved, Plt WNL SCr 1.22, CrCl 41 mL/min No major drug-interactions. Linezolid  may have some impact on enhancing effect of warfarin  Goal of Therapy:  INR 2-3 Monitor platelets by anticoagulation protocol: Yes   Plan:  Warfarin 5 mg PO once today Pt remains on enoxaparin  bridge of 100 mg subQ q12h while INR subtherapeutic INR daily, CBC and SCr with AM labs tomorrow Monitor for signs of bleeding  Ronal CHRISTELLA Rav, PharmD 06/17/2024,11:25 AM    [1]  Allergies Allergen Reactions   Atenolol Other (See Comments)    Depression    Citalopram Hydrobromide Nausea Only and Other (See  Comments)    Nausea/Fatigue   Clindamycin /Lincomycin Other (See Comments)    Via IV, has caused a metallic taste in the mouth    Codeine Other (See Comments)    Caused Hyperactivity and Dysphoria   Coenzyme Q10 Itching   Erythromycin Itching, Anxiety and Other (See Comments)    Also with jitters   Motrin [Ibuprofen] Other (See Comments)    Is to not take this   Statins Other (See Comments)    Leg muscle weakness w/ rosuvastatin and simvastatin    Latex Rash   Losartan Potassium Other (See Comments)    Muscle aches

## 2024-06-17 NOTE — TOC Progression Note (Signed)
 Transition of Care [facility]) - Progression Note    Patient Details  Name: [name] MRN: [id number] Date of Birth: [birth year]  Transition of [facility]) CM/SW Contact  Deb Loudin, Nathanel, RN Phone Number: 06/17/2024, 10:11 AM  Clinical Narrative: Received auth cert#[id number] till 06/22/24 for [employer] [place] aware-bed available Monday.      Expected Discharge Plan: Skilled Nursing Facility Barriers to Discharge: Continued Medical Work up               Expected Discharge Plan and Services In-house Referral: NA Discharge Planning Services: CM Consult Post Acute Care Choice: Skilled Nursing Facility Living arrangements for the past 2 months: Single Family Home                 DME Arranged: N/A DME Agency: NA       HH Arranged: NA HH Agency: NA         Social Drivers of Health (SDOH) Interventions SDOH Screenings   Food Insecurity: No Food Insecurity (06/10/2024)  Housing: Low Risk (06/10/2024)  Transportation Needs: No Transportation Needs (06/10/2024)  Utilities: Not At Risk (06/10/2024)  Depression (PHQ2-9): Low Risk (05/27/2024)  Social Connections: Moderately Integrated (06/10/2024)  Tobacco Use: Low Risk (06/10/2024)    Readmission Risk Interventions    06/13/2024   11:12 AM 09/19/2022    2:13 PM  Readmission Risk Prevention Plan  Post Dischage Appt  Complete  Medication Screening  Complete  Transportation Screening Complete   PCP or Specialist Appt within 3-5 Days Complete   HRI or Home Care Consult Complete   Social Work Consult for Recovery Care Planning/Counseling Complete   Palliative Care Screening Not Applicable   Medication Review Oceanographer) Complete

## 2024-06-17 NOTE — Progress Notes (Signed)
 "                                                                                                                                                                                                          Daily Progress Note   Patient Name: [name]       Date: 06/17/2024 DOB: [birth year]  Age: 84 y.o. MRN#: [id number] Attending Physician: Cheryle Page, MD Primary Care Physician: [name], MD Admit Date: 06/10/2024  Reason for Consultation/Follow-up: Establishing goals of care and Non pain symptom management  Subjective:    Length of Stay: 7  Current Medications: Scheduled Meds:   carvedilol   3.125 mg Oral BID WC   diltiazem   120 mg Oral Q8H   enoxaparin  (LOVENOX ) injection  100 mg Subcutaneous BID   insulin  aspart  0-15 Units Subcutaneous Q4H   levothyroxine   112 mcg Oral Q0600   nystatin    Topical BID   warfarin  5 mg Oral ONCE-1600   Warfarin - Pharmacist Dosing Inpatient   Does not apply q1600    Continuous Infusions:  linezolid  (ZYVOX ) IV 600 mg (06/17/24 0904)    PRN Meds: acetaminophen , ALPRAZolam , HYDROmorphone  (DILAUDID ) injection, LORazepam , melatonin, ondansetron  (ZOFRAN ) IV, mouth rinse, oxyCODONE , polyethylene glycol, senna  Physical Exam          Vital Signs: BP 126/75   Pulse 78   Temp 98 F (36.7 C) (Oral)   Resp 19   Ht 5' 4 (1.626 m)   Wt 111 kg   SpO2 92%   BMI 42.02 kg/m  SpO2: SpO2: 92 % O2 Device: O2 Device: Nasal Cannula O2 Flow Rate: O2 Flow Rate (L/min): 2 L/min  Intake/output summary:  Intake/Output Summary (Last 24 hours) at 06/17/2024 1245 Last data filed at 06/17/2024 0930 Gross per 24 hour  Intake 994 ml  Output 475 ml  Net 519 ml   LBM: Last BM Date : 06/15/24 Baseline Weight: Weight: 109.3 kg Most recent weight: Weight: 111 kg       Palliative Assessment/Data:      Patient Active Problem List   Diagnosis Date Noted   Generalized weakness 06/13/2024   Pericardial effusion 06/13/2024    Warfarin-induced coagulopathy 06/13/2024   Hyperkalemia 06/13/2024   AKI (acute kidney injury) 06/10/2024   Atrial fibrillation with RVR (HCC) 05/12/2024   Atypical chest pain 05/12/2024   Acute on chronic heart failure with preserved ejection fraction (HFpEF) (HCC) 09/26/2022   Permanent atrial fibrillation (HCC) 09/26/2022   Warfarin anticoagulation 09/18/2022   Sepsis (HCC) 09/18/2022   Sepsis due  to cellulitis (HCC) 09/18/2022   Lower extremity cellulitis 09/18/2022   Chronic intermittent hypoxia with obstructive sleep apnea 09/12/2021   Severe obstructive sleep apnea-hypopnea syndrome 09/12/2021   Acute medial meniscus tear of right knee 03/31/2018   Acute pain of right knee 01/12/2018   Hypercoagulation syndrome 05/24/2014   Hunter's glossitis 05/24/2014   Sarcoidosis 05/24/2014   Hypoxemia 05/24/2014   Primary hypertension 10/13/2013   Nocturnal hypoxia 05/19/2013   Obstructive sleep apnea (adult) (pediatric) 05/19/2013   Obesity 05/19/2013   OSA on CPAP    Diffuse lymphadenopathy 05/26/2012   Cough 05/20/2012   OSA (obstructive sleep apnea) 05/20/2012   Pulmonary embolism (HCC) 04/29/2012   DVT (deep venous thrombosis) (HCC) 04/29/2012    Palliative Care Assessment & Plan   Patient Profile: 84 year old lady admitted for pericardial effusion She has medical history of atrial fibrillation was on warfarin, sarcoidosis, chronic lower extremity wounds Patient admitted with weakness and low blood pressures This hospital course complicated by Staph epidermidis bacteremia, lower extremity wounds, currently on empiric Zyvox , cardiology also following She has history of obstructive sleep apnea physical deconditioning functional decline anxiety insomnia Assessment: Anxiety Insomnia Generalized weakness Functional decline   Recommendations/Plan: Anxiety: Patient has benzodiazepines prescribed.  I discussed with her about Xanax  and Ativan  that  she has on board. Insomnia Patient has melatonin prescribed.  Also on benzodiazepines Generalized pain, discomfort in both lower extremities-patient has oral and IV opioids available for pain management Care coordination: Skilled nursing facility rehabilitation attempt is being arranged for her, recommend palliative support in the outpatient setting Offered active listening and supportive empathic presence and other therapeutic communication techniques were employed today at the time of this encounter  Goals of Care and Additional Recommendations: Limitations on Scope of Treatment: Full Scope Treatment  Code Status:    Code Status Orders  (From admission, onward)           Start     Ordered   06/10/24 1404  Full code  Continuous       Question:  By:  Answer:  Consent: discussion documented in EHR   06/10/24 1404           Code Status History     Date Active Date Inactive Code Status Order ID Comments User Context   05/12/2024 2334 05/16/2024 1907 Full Code [id number]  Fausto Burnard LABOR, DO ED   09/18/2022 1701 09/26/2022 2117 Full Code [id number]  Zella Katha HERO, MD ED      Advance Directive Documentation    Flowsheet Row Most Recent Value  Type of Advance Directive Healthcare Power of Attorney, Living will  Pre-existing out of facility DNR order (yellow form or pink MOST form) --  MOST Form in Place? --    Prognosis:  Unable to determine  Discharge Planning: Skilled Nursing Facility for rehab with Palliative care service follow-up  Care plan was discussed with patient and patient's husband present at the bedside. I personally spent a total of  40 minutes in the care of the patient today including preparing to see the patient, getting/reviewing separately obtained history, performing a medically appropriate exam/evaluation, counseling and educating, documenting clinical information in the EHR, and coordinating care.   Thank you for allowing the Palliative  Medicine Team to assist in the care of this patient.       Greater than 50%  of this time was spent counseling and coordinating care related to the above assessment and plan.  Lonia Serve, MD  Please contact Palliative Medicine  Team phone at [phone] for questions and concerns.       "

## 2024-06-18 DIAGNOSIS — N179 Acute kidney failure, unspecified: Secondary | ICD-10-CM | POA: Diagnosis not present

## 2024-06-18 LAB — CBC WITH DIFFERENTIAL/PLATELET
Abs Immature Granulocytes: 0.05 K/uL (ref 0.00–0.07)
Basophils Absolute: 0 K/uL (ref 0.0–0.1)
Basophils Relative: 0 %
Eosinophils Absolute: 0.3 K/uL (ref 0.0–0.5)
Eosinophils Relative: 3 %
HCT: 31.4 % — ABNORMAL LOW (ref 36.0–46.0)
Hemoglobin: 10.3 g/dL — ABNORMAL LOW (ref 12.0–15.0)
Immature Granulocytes: 1 %
Lymphocytes Relative: 16 %
Lymphs Abs: 1.3 K/uL (ref 0.7–4.0)
MCH: 29.9 pg (ref 26.0–34.0)
MCHC: 32.8 g/dL (ref 30.0–36.0)
MCV: 91 fL (ref 80.0–100.0)
Monocytes Absolute: 1 K/uL (ref 0.1–1.0)
Monocytes Relative: 12 %
Neutro Abs: 5.7 K/uL (ref 1.7–7.7)
Neutrophils Relative %: 68 %
Platelets: 227 K/uL (ref 150–400)
RBC: 3.45 MIL/uL — ABNORMAL LOW (ref 3.87–5.11)
RDW: 16.1 % — ABNORMAL HIGH (ref 11.5–15.5)
WBC: 8.3 K/uL (ref 4.0–10.5)
nRBC: 0 % (ref 0.0–0.2)

## 2024-06-18 LAB — BASIC METABOLIC PANEL WITH GFR
Anion gap: 9 (ref 5–15)
BUN: 29 mg/dL — ABNORMAL HIGH (ref 8–23)
CO2: 28 mmol/L (ref 22–32)
Calcium: 8.7 mg/dL — ABNORMAL LOW (ref 8.9–10.3)
Chloride: 90 mmol/L — ABNORMAL LOW (ref 98–111)
Creatinine, Ser: 1.3 mg/dL — ABNORMAL HIGH (ref 0.44–1.00)
GFR, Estimated: 40 mL/min — ABNORMAL LOW
Glucose, Bld: 103 mg/dL — ABNORMAL HIGH (ref 70–99)
Potassium: 4.4 mmol/L (ref 3.5–5.1)
Sodium: 126 mmol/L — ABNORMAL LOW (ref 135–145)

## 2024-06-18 LAB — GLUCOSE, CAPILLARY
Glucose-Capillary: 84 mg/dL (ref 70–99)
Glucose-Capillary: 108 mg/dL — ABNORMAL HIGH (ref 70–99)
Glucose-Capillary: 188 mg/dL — ABNORMAL HIGH (ref 70–99)
Glucose-Capillary: 137 mg/dL — ABNORMAL HIGH (ref 70–99)
Glucose-Capillary: 117 mg/dL — ABNORMAL HIGH (ref 70–99)
Glucose-Capillary: 95 mg/dL (ref 70–99)

## 2024-06-18 LAB — PROTIME-INR
INR: 1.5 — ABNORMAL HIGH (ref 0.8–1.2)
Prothrombin Time: 19.4 s — ABNORMAL HIGH (ref 11.4–15.2)

## 2024-06-18 LAB — MAGNESIUM: Magnesium: 2.4 mg/dL (ref 1.7–2.4)

## 2024-06-18 MED ORDER — SODIUM CHLORIDE 1 G PO TABS
1.0000 g | ORAL_TABLET | Freq: Three times a day (TID) | ORAL | Status: DC
  Administered 2024-06-18 – 2024-06-19 (×4): 1 g via ORAL
  Filled 2024-06-18 (×4): qty 1

## 2024-06-18 MED ORDER — WARFARIN SODIUM 5 MG PO TABS
7.5000 mg | ORAL_TABLET | Freq: Once | ORAL | Status: AC
  Administered 2024-06-18: 7.5 mg via ORAL
  Filled 2024-06-18: qty 1

## 2024-06-18 MED ORDER — INSULIN ASPART 100 UNIT/ML IJ SOLN
0.0000 [IU] | Freq: Three times a day (TID) | INTRAMUSCULAR | Status: DC
  Administered 2024-06-18: 4 [IU] via SUBCUTANEOUS
  Administered 2024-06-18 – 2024-06-20 (×4): 3 [IU] via SUBCUTANEOUS
  Filled 2024-06-18 (×4): qty 3
  Filled 2024-06-18: qty 4

## 2024-06-18 MED ORDER — FUROSEMIDE 10 MG/ML IJ SOLN
40.0000 mg | Freq: Every day | INTRAMUSCULAR | Status: DC
  Administered 2024-06-18 – 2024-06-19 (×2): 40 mg via INTRAVENOUS
  Filled 2024-06-18 (×2): qty 4

## 2024-06-18 MED ORDER — IPRATROPIUM-ALBUTEROL 0.5-2.5 (3) MG/3ML IN SOLN
3.0000 mL | RESPIRATORY_TRACT | Status: AC | PRN
  Administered 2024-06-18 – 2024-06-19 (×2): 3 mL via RESPIRATORY_TRACT
  Filled 2024-06-18 (×2): qty 3

## 2024-06-18 MED ORDER — INSULIN ASPART 100 UNIT/ML IJ SOLN: 0.0000 [IU] | Freq: Every day | INTRAMUSCULAR | Status: DC

## 2024-06-18 NOTE — Progress Notes (Signed)
 "                                                                                                                                                                                                          Daily Progress Note   Patient Name: [name]       Date: 06/18/2024 DOB: Apr 15, 1941  Age: 84 y.o. MRN#: [id number] Attending Physician: Cheryle Page, MD Primary Care Physician: [name], MD Admit Date: 06/10/2024  Reason for Consultation/Follow-up: Establishing goals of care and Non pain symptom management  Subjective:  Resting in bed, husband at bedside, states that she slept well, legs discomfort is tolerable, overall appears with continued generalized weakness.  Patient seen today for ongoing symptom management and supportive care. Length of Stay: 8  Current Medications: Scheduled Meds:   carvedilol   3.125 mg Oral BID WC   diltiazem   120 mg Oral Q8H   enoxaparin  (LOVENOX ) injection  100 mg Subcutaneous BID   furosemide   40 mg Intravenous Daily   insulin  aspart  0-20 Units Subcutaneous TID WC   insulin  aspart  0-5 Units Subcutaneous QHS   levothyroxine   112 mcg Oral Q0600   nystatin    Topical BID   sodium chloride   1 g Oral TID WC   warfarin  7.5 mg Oral ONCE-1600   Warfarin - Pharmacist Dosing Inpatient   Does not apply q1600    Continuous Infusions:  linezolid  (ZYVOX ) IV 600 mg (06/18/24 1122)    PRN Meds: acetaminophen , ALPRAZolam , HYDROmorphone  (DILAUDID ) injection, ipratropium-albuterol , LORazepam , melatonin, ondansetron  (ZOFRAN ) IV, mouth rinse, oxyCODONE , polyethylene glycol, senna  Physical Exam         Awake alert Bilateral LE in dressings Resting in bed Regular breath sounds Abdomen not tender  Vital Signs: BP (!) 114/57 (BP Location: Left Arm)   Pulse 76   Temp 97.6 F (36.4 C) (Oral)   Resp (!) 22   Ht 5' 4 (1.626 m)   Wt 108.4 kg   SpO2 93%   BMI 41.02 kg/m  SpO2: SpO2: 93 % O2 Device: O2 Device: Nasal Cannula O2 Flow Rate:  O2 Flow Rate (L/min): 3 L/min  Intake/output summary:  Intake/Output Summary (Last 24 hours) at 06/18/2024 1236 Last data filed at 06/17/2024 2300 Gross per 24 hour  Intake --  Output 350 ml  Net -350 ml   LBM: Last BM Date : 06/17/24 Baseline Weight: Weight: 109.3 kg Most recent weight: Weight: 108.4 kg       Palliative Assessment/Data:      Patient Active Problem List  Diagnosis Date Noted   Generalized weakness 06/13/2024   Pericardial effusion 06/13/2024   Warfarin-induced coagulopathy 06/13/2024   Hyperkalemia 06/13/2024   AKI (acute kidney injury) 06/10/2024   Atrial fibrillation with RVR (HCC) 05/12/2024   Atypical chest pain 05/12/2024   Acute on chronic heart failure with preserved ejection fraction (HFpEF) (HCC) 09/26/2022   Permanent atrial fibrillation (HCC) 09/26/2022   Warfarin anticoagulation 09/18/2022   Sepsis (HCC) 09/18/2022   Sepsis due to cellulitis (HCC) 09/18/2022   Lower extremity cellulitis 09/18/2022   Chronic intermittent hypoxia with obstructive sleep apnea 09/12/2021   Severe obstructive sleep apnea-hypopnea syndrome 09/12/2021   Acute medial meniscus tear of right knee 03/31/2018   Acute pain of right knee 01/12/2018   Hypercoagulation syndrome 05/24/2014   Hunter's glossitis 05/24/2014   Sarcoidosis 05/24/2014   Hypoxemia 05/24/2014   Primary hypertension 10/13/2013   Nocturnal hypoxia 05/19/2013   Obstructive sleep apnea (adult) (pediatric) 05/19/2013   Obesity 05/19/2013   OSA on CPAP    Diffuse lymphadenopathy 05/26/2012   Cough 05/20/2012   OSA (obstructive sleep apnea) 05/20/2012   Pulmonary embolism (HCC) 04/29/2012   DVT (deep venous thrombosis) (HCC) 04/29/2012    Palliative Care Assessment & Plan   Patient Profile: 84 year old lady admitted for pericardial effusion She has medical history of atrial fibrillation was on warfarin, sarcoidosis, chronic lower extremity wounds Patient admitted with weakness and low blood  pressures This hospital course complicated by Staph epidermidis bacteremia, lower extremity wounds, currently on empiric Zyvox , cardiology also following She has history of obstructive sleep apnea physical deconditioning functional decline anxiety insomnia Assessment: Anxiety Insomnia Generalized weakness Functional decline   Recommendations/Plan:  Anxiety  Patient has benzodiazepines prescribed.  Discussed current use of alprazolam  (Xanax ) and lorazepam  (Ativan ), including indications, risks, and avoidance of duplicative use.  Reinforced cautious use given age/comorbidities.  Continue current regimen with close monitoring.  Insomnia  Patient has melatonin available and is also on benzodiazepines.  Reviewed sleep hygiene and non-pharmacologic strategies.  Continue current medications as ordered; reassess effectiveness.  Generalized Pain / Lower Extremity Discomfort  Patient has both oral and IV opioid options available for pain control.  In the past 24 hours, she has utilized one dose of oxycodone  IR 5 mg PO, suggesting pain currently manageable.  Continue current PRN pain regimen; monitor symptom trajectory and opioid requirements.  Care Coordination / Disposition Planning  Skilled nursing facility rehabilitation attempt is being arranged.  Recommend outpatient palliative care follow-up for continued symptom management and support after discharge.  Psychosocial / Supportive Care:  Provided active listening, empathic presence, and supportive counseling during todays encounter.  Therapeutic communication techniques were employed to address emotional distress and reinforce care planning. Goals of Care and Additional Recommendations: Limitations on Scope of Treatment: Full Scope Treatment  Code Status:    Code Status Orders  (From admission, onward)           Start     Ordered   06/10/24 1404  Full code  Continuous       Question:  By:  Answer:  Consent:  discussion documented in EHR   06/10/24 1404           Code Status History     Date Active Date Inactive Code Status Order ID Comments User Context   05/12/2024 2334 05/16/2024 1907 Full Code [id number]  Fausto Burnard LABOR, DO ED   09/18/2022 1701 09/26/2022 2117 Full Code [id number]  Zella Katha HERO, MD ED      Advance Directive  Documentation    Flowsheet Row Most Recent Value  Type of Advance Directive Healthcare Power of Attorney, Living will  Pre-existing out of facility DNR order (yellow form or pink MOST form) --  MOST Form in Place? --    Prognosis:  Unable to determine  Discharge Planning: Skilled Nursing Facility for rehab with Palliative care service follow-up  Care plan was discussed with patient and patient's husband present at the bedside. I personally spent a total of 35 minutes in the care of the patient today including preparing to see the patient, getting/reviewing separately obtained history, performing a medically appropriate exam/evaluation, counseling and educating, documenting clinical information in the EHR, and coordinating care.   Thank you for allowing the Palliative Medicine Team to assist in the care of this patient.       Greater than 50%  of this time was spent counseling and coordinating care related to the above assessment and plan.  Lonia Serve, MD  Please contact Palliative Medicine Team phone at [phone] for questions and concerns.       "

## 2024-06-18 NOTE — Progress Notes (Signed)
 PHARMACY - ANTICOAGULATION CONSULT NOTE  Pharmacy Consult for warfarin Indication: atrial fibrillation, PE  Allergies[1]  Patient Measurements: Height: 5' 4 (162.6 cm) Weight: 108.4 kg (238 lb 15.7 oz) IBW/kg (Calculated) : 54.7 HEPARIN DW (KG): 80.7  Vital Signs: Temp: 97.6 F (36.4 C) (01/10 0628) Temp Source: Oral (01/10 0628) BP: 114/57 (01/10 0628) Pulse Rate: 76 (01/10 0628)  Labs: Recent Labs    06/16/24 0612 06/17/24 0556 06/17/24 1131 06/18/24 0529  HGB 10.5*  --   --  10.3*  HCT 32.6*  --   --  31.4*  PLT 182  --   --  227  LABPROT  --   --  18.6* 19.4*  INR  --   --  1.5* 1.5*  CREATININE 1.12* 1.22*  --  1.30*    Estimated Creatinine Clearance: 38.1 mL/min (A) (by C-G formula based on SCr of 1.3 mg/dL (H)).   Medical History: Past Medical History:  Diagnosis Date   Anxiety    takes Lorazepam  daily as needed   Arthritis    Cancer (HCC)    thyroid    Cataracts, bilateral    immature   Chronic back pain    compressed vertebrae,takes Fosamax weekly   Family history of adverse reaction to anesthesia    granddaughter gets very sick and mean   History of blood transfusion    no abnormal reaction noted   History of bronchitis 05/2015   History of colon polyps    benign   Hyperlipidemia    takes Zetia  daily   Hypertension    takes Diltiazem  and Avapro  daily   Hypothyroidism    takes Synthroid  daily   Ischemic colitis    hx of-many yrs ago   Joint pain    Joint swelling    OSA on CPAP    05-08-12 AHi was 46, RDI  53. titrated to   9 cm water   with 3 cm flex.-nadir 75%   PE (pulmonary embolism)    takes Coumadin  daily   Peripheral edema    Pneumonia 1992   hx of   Sarcoidosis    Shortness of breath    Urinary frequency    Weakness    left leg.Numbness and tingling.Has sciatica   Wheeze    occaionally. Not new    Medications: Pt chronically anticoagulated with warfarin -Home dose: warfarin 5 mg PO daily -Last dose: 1/1 or 1/2  PTA  Assessment: Pt is an 33 yoF who is chronically anticoagulated with warfarin for history of afib, PE. Pt presented on 1/2 with hypotension, pericardial effusion, supratherapeutic INR.  Warfarin has been held since admission, pt has been on therapeutic enoxaparin  since 1/5. Cardiology recommended transition from warfarin to apixaban , however, pt/spouse have concerns about switching anticoagulant due to it being cost-prohibitive and having to meet deductible. Pharmacy consulted to resume warfarin on 1/9.   Significant Events: -1/2: INR >10. Vitamin K  5 mg IV x1 given. Transfused -1/5  Therapeutic LMWH started -1/9  Resume warfarin, continue LMWH bridge  Today, 06/18/2024 INR remains 1.5, subtherapeutic as expected after resumed warfarin x1 dose CBC: Hgb low/stable, Plt WNL SCr up to 1.3, CrCl > 30 ml/min No major drug-interactions. Linezolid  may have some impact on enhancing effect of warfarin  Goal of Therapy:  INR 2-3 Monitor platelets by anticoagulation protocol: Yes   Plan:  Warfarin 7.5 mg PO, boosted dose once today Pt remains on enoxaparin  bridge of 100 mg subQ q12h while INR subtherapeutic INR daily, CBC and SCr with  AM labs tomorrow Monitor for signs of bleeding    Wanda Hasting PharmD, BCPS [facility] main pharmacy [phone] 06/18/2024 10:22 AM      [1]  Allergies Allergen Reactions   Atenolol Other (See Comments)    Depression    Citalopram Hydrobromide Nausea Only and Other (See Comments)    Nausea/Fatigue   Clindamycin /Lincomycin Other (See Comments)    Via IV, has caused a metallic taste in the mouth    Codeine Other (See Comments)    Caused Hyperactivity and Dysphoria   Coenzyme Q10 Itching   Erythromycin Itching, Anxiety and Other (See Comments)    Also with jitters   Motrin [Ibuprofen] Other (See Comments)    Is to not take this   Statins Other (See Comments)    Leg muscle weakness w/ rosuvastatin and simvastatin    Latex Rash   Losartan Potassium  Other (See Comments)    Muscle aches

## 2024-06-18 NOTE — Progress Notes (Signed)
 Pt has verbalized multiple times to this RN tonight that she is depressed. Pt states she cannot eat because of being depressed, that she cannot even force herself to eat. Pt with overall flat affect and poor motivation to get OOB, watch TV or do anything other than stare at the wall. Pt eventually willing to get OOB to the chair for a little bit with some encouragement. Pt denies any self harm/suicidal ideation when asked. Pt offered emotional support from this RN.

## 2024-06-18 NOTE — Progress Notes (Signed)
" °   06/18/24 2249  BiPAP/CPAP/SIPAP  BiPAP/CPAP/SIPAP Pt Type Adult  BiPAP/CPAP/SIPAP Resmed  Mask Type Full face mask  Dentures removed? Not applicable  Mask Size Large  Respiratory Rate 20 breaths/min  Flow Rate 3 lpm  Patient Home Machine No  Patient Home Mask No  Patient Home Tubing No  Auto Titrate Yes  Minimum cmH2O 5 cmH2O  Maximum cmH2O 15 cmH2O  Nasal massage performed Yes  Device Plugged into RED Power Outlet Yes  BiPAP/CPAP /SiPAP Vitals  Pulse Rate 72  Resp 20  SpO2 91 %  Bilateral Breath Sounds Clear;Diminished    "

## 2024-06-18 NOTE — Progress Notes (Signed)
 " PROGRESS NOTE    KC SEDLAK  FMW:[id number] DOB: April 03, 1941 DOA: 06/10/2024 PCP: [name], MD   Brief Narrative:  84 year old woman PMH including A-fib on warfarin, sarcoidosis, chronic lower extremity wounds presented with weakness and hypotension.  She was admitted by PCCM to ICU for AKI, hyperkalemia, coagulopathy, moderate pericardial effusion and initially received FFP, vitamin K , Lokelma  and bicarb infusion along with empiric antibiotics.  Echo showed EF of 45 to 50% with large pericardial effusion without tamponade.  Cardiology was consulted.  She was transferred to TRH service from 06/14/2023 onwards.  Assessment & Plan:   Shock: Present on admission Staph epidermidis bacteremia Lower extremity wounds - Shock has resolved.   - Continue wound care as per wound care consult recommendations -Currently on empiric Zyvox .  Follow-up repeat blood cultures from 06/16/24 are negative so far.  DC Zyvox  after today's doses.  Pericardial effusion - Follow-up echo on 06/14/2024 showed moderate pericardial effusion.  Cardiology signed off on 06/15/2024 and recommended outpatient follow-up with cardiology.  No signs of tamponade.  Mild cardiomyopathy Acute systolic heart failure - Strict input output.  Daily weights.  Fluid restriction.  Continue Coreg .  Patient was given a dose of IV Lasix  on 06/17/2024.  Will start scheduled IV Lasix  from today.  Leukocytosis - Resolved.    Permanent A-fib - Continue Coreg  and Cardizem .  Rate currently controlled.  Cardiology had recommended to switch to Eliquis .  Patient was on Coumadin  prior to presentation and had issues with apixaban  in the past due to cost.  Will continue with Lovenox  bridging and Coumadin .  Outpatient follow-up with PCP and/or cardiologist for further discussions regarding the switch to Eliquis .  AKI Uremia Hyperkalemia: Resolved Acute metabolic acidosis: Resolved -Secondary to ischemic and prerenal insults from  hypotension and prolonged decreased oral intake. Seen by nephrology. Treated with Lokelma  and bicarbonate infusion.  -Renal function has recovered, uremia resolved, potassium within normal limits.  Nephrology has signed off.  ARB remains on hold.  Hyponatremia - Sodium worsened to 126 today.  Possibly from volume overload.  Diuretic plan as above.  Start salt tablets.  Monitor  Acute anemia Warfarin coagulopathy Hematochezia on admission - Received vitamin K  and FFP on admission.  Currently hemoglobin stable.  Coumadin  on hold for now  Hypothyroidism - Continue Synthroid   OSA - Noncompliant to CPAP  Obesity class III - Outpatient follow-up  Physical deconditioning - PT recommending SNF placement.  TOC following  Goals of care - Overall prognosis is guarded to poor.  palliative care consultation appreciated.  Patient remains full code.  DVT prophylaxis: Eliquis   code Status: Full Family Communication: Husband at bedside  Disposition Plan: Status is: Inpatient Remains inpatient appropriate because: Of severity of illness.  Need for SNF placement    Consultants: PCCM/cardiology.  palliative care  Procedures: As above  Antimicrobials:  Anti-infectives (From admission, onward)    Start     Dose/Rate Route Frequency Ordered Stop   06/12/24 1000  linezolid  (ZYVOX ) IVPB 600 mg        600 mg 300 mL/hr over 60 Minutes Intravenous Every 12 hours 06/10/24 1709     06/11/24 1200  ceFEPIme  (MAXIPIME ) 2 g in sodium chloride  0.9 % 100 mL IVPB  Status:  Discontinued        2 g 200 mL/hr over 30 Minutes Intravenous Every 24 hours 06/10/24 1659 06/11/24 1003   06/11/24 1000  linezolid  (ZYVOX ) IVPB 600 mg  Status:  Discontinued  600 mg 300 mL/hr over 60 Minutes Intravenous Every 12 hours 06/10/24 1415 06/10/24 1709   06/10/24 1245  vancomycin  (VANCOREADY) IVPB 2000 mg/400 mL        2,000 mg 200 mL/hr over 120 Minutes Intravenous  Once 06/10/24 1204 06/10/24 1623   06/10/24  1215  ceFEPIme  (MAXIPIME ) 2 g in sodium chloride  0.9 % 100 mL IVPB        2 g 200 mL/hr over 30 Minutes Intravenous  Once 06/10/24 1203 06/10/24 1251   06/10/24 1215  metroNIDAZOLE  (FLAGYL ) IVPB 500 mg        500 mg 100 mL/hr over 60 Minutes Intravenous  Once 06/10/24 1203 06/10/24 1430   06/10/24 1215  vancomycin  (VANCOCIN ) IVPB 1000 mg/200 mL premix  Status:  Discontinued        1,000 mg 200 mL/hr over 60 Minutes Intravenous  Once 06/10/24 1203 06/10/24 1204        Subjective: Patient seen and examined at bedside.  No fever, vomiting, chest pain reported.  Objective: Vitals:   06/17/24 2324 06/18/24 0100 06/18/24 0500 06/18/24 0628  BP:    (!) 114/57  Pulse: 71   76  Resp: 20 18  (!) 22  Temp:    97.6 F (36.4 C)  TempSrc:    Oral  SpO2: 91%   93%  Weight:   108.4 kg   Height:        Intake/Output Summary (Last 24 hours) at 06/18/2024 0742 Last data filed at 06/17/2024 2300 Gross per 24 hour  Intake 100 ml  Output 350 ml  Net -250 ml   Filed Weights   06/16/24 0507 06/17/24 0500 06/18/24 0500  Weight: 108.5 kg 111 kg 108.4 kg    Examination:  General: On 3 L oxygen  via nasal cannula.  No distress.   ENT/neck: No JVD elevation or palpable thyromegaly noted respiratory: Decreased breath sounds at bases bilaterally with basilar crackles CVS: S1-S2 heard; currently rate controlled  abdominal: Soft, morbidly obese, nontender, distended slightly; no organomegaly, bowel sounds are heard Extremities: Bilateral lower extremities are wrapped; no cyanosis CNS: Remains slow to respond.  Alert; no obvious focal deficits  Lymph: No obvious lymphadenopathy palpable Skin: No obvious lesions/ecchymosis  psych: Mostly flat affect with no signs of agitation  musculoskeletal: No obvious joint swelling/deformity   Data Reviewed: I have personally reviewed following labs and imaging studies  CBC: Recent Labs  Lab 06/12/24 0238 06/13/24 0249 06/14/24 0512 06/16/24 0612  06/18/24 0529  WBC 11.7* 10.9*  --  9.1 8.3  NEUTROABS  --   --   --   --  5.7  HGB 9.9* 9.7* 9.4* 10.5* 10.3*  HCT 29.6* 30.2* 29.2* 32.6* 31.4*  MCV 86.8 89.9  --  91.1 91.0  PLT 244 227  --  182 227   Basic Metabolic Panel: Recent Labs  Lab 06/12/24 0238 06/13/24 0249 06/14/24 0512 06/16/24 0612 06/17/24 0556 06/18/24 0529  NA 132* 133* 134* 130* 127* 126*  K 3.8 4.0 4.0 4.4 4.5 4.4  CL 94* 98 98 93* 91* 90*  CO2 26 27 29 23 26 28   GLUCOSE 84 89 88 86 86 103*  BUN 82* 63* 41* 27* 30* 29*  CREATININE 1.70* 1.26* 1.00 1.12* 1.22* 1.30*  CALCIUM  8.3* 8.1* 8.3* 8.7* 8.8* 8.7*  MG 2.7* 2.7*  --  2.4 2.5* 2.4  PHOS 3.9 2.5  --   --   --   --    GFR: Estimated Creatinine Clearance: 38.1  mL/min (A) (by C-G formula based on SCr of 1.3 mg/dL (H)). Liver Function Tests: Recent Labs  Lab 06/16/24 0612  AST 26  ALT 17  ALKPHOS 140*  BILITOT 0.8  PROT 6.9  ALBUMIN 2.6*   No results for input(s): LIPASE, AMYLASE in the last 168 hours. No results for input(s): AMMONIA in the last 168 hours. Coagulation Profile: Recent Labs  Lab 06/11/24 0809 06/13/24 0249 06/17/24 1131 06/18/24 0529  INR 2.1* 1.5* 1.5* 1.5*   Cardiac Enzymes: No results for input(s): CKTOTAL, CKMB, CKMBINDEX, TROPONINI in the last 168 hours. BNP (last 3 results) Recent Labs    05/12/24 1806 06/10/24 0824  PROBNP 1,710.0* 6,454.0*   HbA1C: No results for input(s): HGBA1C in the last 72 hours. CBG: Recent Labs  Lab 06/17/24 1617 06/17/24 2019 06/17/24 2332 06/18/24 0423 06/18/24 0733  GLUCAP 122* 131* 122* 84 108*   Lipid Profile: No results for input(s): CHOL, HDL, LDLCALC, TRIG, CHOLHDL, LDLDIRECT in the last 72 hours. Thyroid  Function Tests: No results for input(s): TSH, T4TOTAL, FREET4, T3FREE, THYROIDAB in the last 72 hours. Anemia Panel: No results for input(s): VITAMINB12, FOLATE, FERRITIN, TIBC, IRON, RETICCTPCT in the last 72  hours. Sepsis Labs: No results for input(s): PROCALCITON, LATICACIDVEN in the last 168 hours.   Recent Results (from the past 240 hours)  Blood culture (routine x 2)     Status: Abnormal   Collection Time: 06/10/24  8:17 AM   Specimen: BLOOD  Result Value Ref Range Status   Specimen Description   Final    BLOOD RIGHT ANTECUBITAL Performed at [facility], [address] [place], [place] [address]    Special Requests   Final    BOTTLES DRAWN AEROBIC AND ANAEROBIC Blood Culture adequate volume Performed at [facility], [address], [place], [place] [address]    Culture  Setup Time   Final    GRAM POSITIVE COCCI IN BOTH AEROBIC AND ANAEROBIC BOTTLES CRITICAL RESULT CALLED TO, READ BACK BY AND VERIFIED WITH: PHARMD NICK G. [id number] AT 0930, ADC    Culture (A)  Final    STAPHYLOCOCCUS EPIDERMIDIS THE SIGNIFICANCE OF ISOLATING THIS ORGANISM FROM A SINGLE SET OF BLOOD CULTURES WHEN MULTIPLE SETS ARE DRAWN IS UNCERTAIN. PLEASE NOTIFY THE MICROBIOLOGY DEPARTMENT WITHIN ONE WEEK IF SPECIATION AND SENSITIVITIES ARE REQUIRED. Performed at [facility], [address] [place], [place] [address]    Report Status 06/13/2024 FINAL  Final  Blood Culture ID Panel (Reflexed)     Status: Abnormal   Collection Time: 06/10/24  8:17 AM  Result Value Ref Range Status   Enterococcus faecalis NOT DETECTED NOT DETECTED Final   Enterococcus Faecium NOT DETECTED NOT DETECTED Final   Listeria monocytogenes NOT DETECTED NOT DETECTED Final   Staphylococcus species DETECTED (A) NOT DETECTED Final    Comment: CRITICAL RESULT CALLED TO, READ BACK BY AND VERIFIED WITH: PHARMD NICK G. [id number] AT 0930, ADC    Staphylococcus aureus (BCID) NOT DETECTED NOT DETECTED Final   Staphylococcus epidermidis DETECTED (A) NOT DETECTED Final    Comment: Methicillin (oxacillin) resistant coagulase negative staphylococcus. Possible blood culture contaminant (unless isolated  from more than one blood culture draw or clinical case suggests pathogenicity). No antibiotic treatment is indicated for blood  culture contaminants. CRITICAL RESULT CALLED TO, READ BACK BY AND VERIFIED WITH: PHARMD NICK G. [id number] AT 0930, ADC    Staphylococcus lugdunensis NOT DETECTED NOT DETECTED Final   Streptococcus species NOT DETECTED NOT DETECTED  Final   Streptococcus agalactiae NOT DETECTED NOT DETECTED Final   Streptococcus pneumoniae NOT DETECTED NOT DETECTED Final   Streptococcus pyogenes NOT DETECTED NOT DETECTED Final   A.calcoaceticus-baumannii NOT DETECTED NOT DETECTED Final   Bacteroides fragilis NOT DETECTED NOT DETECTED Final   Enterobacterales NOT DETECTED NOT DETECTED Final   Enterobacter cloacae complex NOT DETECTED NOT DETECTED Final   Escherichia coli NOT DETECTED NOT DETECTED Final   Klebsiella aerogenes NOT DETECTED NOT DETECTED Final   Klebsiella oxytoca NOT DETECTED NOT DETECTED Final   Klebsiella pneumoniae NOT DETECTED NOT DETECTED Final   Proteus species NOT DETECTED NOT DETECTED Final   Salmonella species NOT DETECTED NOT DETECTED Final   Serratia marcescens NOT DETECTED NOT DETECTED Final   Haemophilus influenzae NOT DETECTED NOT DETECTED Final   Neisseria meningitidis NOT DETECTED NOT DETECTED Final   Pseudomonas aeruginosa NOT DETECTED NOT DETECTED Final   Stenotrophomonas maltophilia NOT DETECTED NOT DETECTED Final   Candida albicans NOT DETECTED NOT DETECTED Final   Candida auris NOT DETECTED NOT DETECTED Final   Candida glabrata NOT DETECTED NOT DETECTED Final   Candida krusei NOT DETECTED NOT DETECTED Final   Candida parapsilosis NOT DETECTED NOT DETECTED Final   Candida tropicalis NOT DETECTED NOT DETECTED Final   Cryptococcus neoformans/gattii NOT DETECTED NOT DETECTED Final   Methicillin resistance mecA/C DETECTED (A) NOT DETECTED Final    Comment: CRITICAL RESULT CALLED TO, READ BACK BY AND VERIFIED WITH: PHARMD NICK G. [id number] AT 0930,  ADC Performed at [facility], [address], [place], [place] [address]   Respiratory (~20 pathogens) panel by PCR     Status: None   Collection Time: 06/10/24  3:27 PM   Specimen: Nasopharyngeal Swab; Respiratory  Result Value Ref Range Status   Adenovirus NOT DETECTED NOT DETECTED Final   Coronavirus 229E NOT DETECTED NOT DETECTED Final    Comment: (NOTE) The Coronavirus on the Respiratory Panel, DOES NOT test for the novel  Coronavirus (2019 nCoV)    Coronavirus HKU1 NOT DETECTED NOT DETECTED Final   Coronavirus NL63 NOT DETECTED NOT DETECTED Final   Coronavirus OC43 NOT DETECTED NOT DETECTED Final   Metapneumovirus NOT DETECTED NOT DETECTED Final   Rhinovirus / Enterovirus NOT DETECTED NOT DETECTED Final   Influenza A NOT DETECTED NOT DETECTED Final   Influenza B NOT DETECTED NOT DETECTED Final   Parainfluenza Virus 1 NOT DETECTED NOT DETECTED Final   Parainfluenza Virus 2 NOT DETECTED NOT DETECTED Final   Parainfluenza Virus 3 NOT DETECTED NOT DETECTED Final   Parainfluenza Virus 4 NOT DETECTED NOT DETECTED Final   Respiratory Syncytial Virus NOT DETECTED NOT DETECTED Final   Bordetella pertussis NOT DETECTED NOT DETECTED Final   Bordetella Parapertussis NOT DETECTED NOT DETECTED Final   Chlamydophila pneumoniae NOT DETECTED NOT DETECTED Final   Mycoplasma pneumoniae NOT DETECTED NOT DETECTED Final    Comment: Performed at [facility], [address] [place], [place] [address]  Resp panel by RT-PCR (RSV, Flu A&B, Covid) Nasal Mucosa     Status: None   Collection Time: 06/10/24  3:27 PM   Specimen: Nasal Mucosa; Nasal Swab  Result Value Ref Range Status   SARS Coronavirus 2 by RT PCR NEGATIVE NEGATIVE Final    Comment: (NOTE) SARS-CoV-2 target nucleic acids are NOT DETECTED.  The SARS-CoV-2 RNA is generally detectable in upper respiratory specimens during the acute phase of infection. The lowest concentration of SARS-CoV-2 viral copies this assay can detect  is  138 copies/mL. A negative result does not preclude SARS-Cov-2 infection and should not be used as the sole basis for treatment or other patient management decisions. A negative result may occur with  improper specimen collection/handling, submission of specimen other than nasopharyngeal swab, presence of viral mutation(s) within the areas targeted by this assay, and inadequate number of viral copies(<138 copies/mL). A negative result must be combined with clinical observations, patient history, and epidemiological information. The expected result is Negative.  Fact Sheet for Patients:  bloggercourse.com  Fact Sheet for Healthcare Providers:  seriousbroker.it  This test is no t yet approved or cleared by the [place]  FDA and  has been authorized for detection and/or diagnosis of SARS-CoV-2 by FDA under an Emergency Use Authorization (EUA). This EUA will remain  in effect (meaning this test can be used) for the duration of the COVID-19 declaration under Section 564(b)(1) of the Act, 21 U.S.C.section 360bbb-3(b)(1), unless the authorization is terminated  or revoked sooner.       Influenza A by PCR NEGATIVE NEGATIVE Final   Influenza B by PCR NEGATIVE NEGATIVE Final    Comment: (NOTE) The Xpert Xpress SARS-CoV-2/FLU/RSV plus assay is intended as an aid in the diagnosis of influenza from Nasopharyngeal swab specimens and should not be used as a sole basis for treatment. Nasal washings and aspirates are unacceptable for Xpert Xpress SARS-CoV-2/FLU/RSV testing.  Fact Sheet for Patients: bloggercourse.com  Fact Sheet for Healthcare Providers: seriousbroker.it  This test is not yet approved or cleared by the [place]  FDA and has been authorized for detection and/or diagnosis of SARS-CoV-2 by FDA under an Emergency Use Authorization (EUA). This EUA will remain in effect  (meaning this test can be used) for the duration of the COVID-19 declaration under Section 564(b)(1) of the Act, 21 U.S.C. section 360bbb-3(b)(1), unless the authorization is terminated or revoked.     Resp Syncytial Virus by PCR NEGATIVE NEGATIVE Final    Comment: (NOTE) Fact Sheet for Patients: bloggercourse.com  Fact Sheet for Healthcare Providers: seriousbroker.it  This test is not yet approved or cleared by the [place]  FDA and has been authorized for detection and/or diagnosis of SARS-CoV-2 by FDA under an Emergency Use Authorization (EUA). This EUA will remain in effect (meaning this test can be used) for the duration of the COVID-19 declaration under Section 564(b)(1) of the Act, 21 U.S.C. section 360bbb-3(b)(1), unless the authorization is terminated or revoked.  Performed at [facility], [address], [place], [place] [address]   MRSA Next Gen by PCR, Nasal     Status: None   Collection Time: 06/10/24  3:36 PM   Specimen: Nasal Mucosa; Nasal Swab  Result Value Ref Range Status   MRSA by PCR Next Gen NOT DETECTED NOT DETECTED Final    Comment: (NOTE) The GeneXpert MRSA Assay (FDA approved for NASAL specimens only), is one component of a comprehensive MRSA colonization surveillance program. It is not intended to diagnose MRSA infection nor to guide or monitor treatment for MRSA infections. Test performance is not FDA approved in patients less than 8 years old. Performed at [facility], [address] [place], [place] [address]   Blood culture (routine x 2)     Status: None   Collection Time: 06/10/24  4:41 PM   Specimen: BLOOD LEFT HAND  Result Value Ref Range Status   Specimen Description   Final    BLOOD LEFT HAND BOTTLES DRAWN AEROBIC ONLY Performed at [facility],  [address] [place], [place] [address]    Special Requests   Final     Blood Culture results may not be optimal due to an inadequate volume of blood received in culture bottles Performed at [facility], [address], [place], [place] [address]    Culture   Final    NO GROWTH 5 DAYS Performed at [facility], [address], [place], [place] [address]    Report Status 06/15/2024 FINAL  Final  Culture, blood (Routine X 2) w Reflex to ID Panel     Status: None (Preliminary result)   Collection Time: 06/16/24  2:22 PM   Specimen: BLOOD LEFT HAND  Result Value Ref Range Status   Specimen Description   Final    BLOOD LEFT HAND Performed at [facility], [address], [place], [place] [address]    Special Requests   Final    BOTTLES DRAWN AEROBIC AND ANAEROBIC Blood Culture adequate volume Performed at [facility], [address] [place], [place] [address]    Culture   Final    NO GROWTH < 24 HOURS Performed at [facility], [address], [place], [place] [address]    Report Status PENDING  Incomplete  Culture, blood (Routine X 2) w Reflex to ID Panel     Status: None (Preliminary result)   Collection Time: 06/16/24  2:27 PM   Specimen: BLOOD LEFT HAND  Result Value Ref Range Status   Specimen Description   Final    BLOOD LEFT HAND Performed at [facility], [address], [place], [place] [address]    Special Requests   Final    BOTTLES DRAWN AEROBIC AND ANAEROBIC Blood Culture adequate volume Performed at [facility], [address], [place], [place] [address]    Culture   Final    NO GROWTH < 24 HOURS Performed at [facility], [address], [place], [place] [address]    Report Status PENDING  Incomplete         Radiology Studies: No results found.       Scheduled Meds:  carvedilol   3.125 mg Oral BID WC   diltiazem   120 mg Oral Q8H   enoxaparin  (LOVENOX ) injection  100 mg Subcutaneous BID   insulin  aspart  0-15 Units Subcutaneous  Q4H   levothyroxine   112 mcg Oral Q0600   nystatin    Topical BID   Warfarin - Pharmacist Dosing Inpatient   Does not apply q1600   Continuous Infusions:  linezolid  (ZYVOX ) IV 600 mg (06/17/24 2244)          Sophie Mao, MD [facility] 06/18/2024, 7:42 AM   "

## 2024-06-19 DIAGNOSIS — N179 Acute kidney failure, unspecified: Secondary | ICD-10-CM | POA: Diagnosis not present

## 2024-06-19 LAB — CBC WITH DIFFERENTIAL/PLATELET
Abs Immature Granulocytes: 0.05 K/uL (ref 0.00–0.07)
Basophils Absolute: 0 K/uL (ref 0.0–0.1)
Basophils Relative: 0 %
Eosinophils Absolute: 0.1 K/uL (ref 0.0–0.5)
Eosinophils Relative: 2 %
HCT: 32.6 % — ABNORMAL LOW (ref 36.0–46.0)
Hemoglobin: 10.8 g/dL — ABNORMAL LOW (ref 12.0–15.0)
Immature Granulocytes: 1 %
Lymphocytes Relative: 15 %
Lymphs Abs: 1.2 K/uL (ref 0.7–4.0)
MCH: 30.2 pg (ref 26.0–34.0)
MCHC: 33.1 g/dL (ref 30.0–36.0)
MCV: 91.1 fL (ref 80.0–100.0)
Monocytes Absolute: 0.8 K/uL (ref 0.1–1.0)
Monocytes Relative: 10 %
Neutro Abs: 5.8 K/uL (ref 1.7–7.7)
Neutrophils Relative %: 72 %
Platelets: 217 K/uL (ref 150–400)
RBC: 3.58 MIL/uL — ABNORMAL LOW (ref 3.87–5.11)
RDW: 15.9 % — ABNORMAL HIGH (ref 11.5–15.5)
WBC: 7.9 K/uL (ref 4.0–10.5)
nRBC: 0 % (ref 0.0–0.2)

## 2024-06-19 LAB — BASIC METABOLIC PANEL WITH GFR
Anion gap: 9 (ref 5–15)
BUN: 27 mg/dL — ABNORMAL HIGH (ref 8–23)
CO2: 28 mmol/L (ref 22–32)
Calcium: 8.7 mg/dL — ABNORMAL LOW (ref 8.9–10.3)
Chloride: 89 mmol/L — ABNORMAL LOW (ref 98–111)
Creatinine, Ser: 1.23 mg/dL — ABNORMAL HIGH (ref 0.44–1.00)
GFR, Estimated: 43 mL/min — ABNORMAL LOW
Glucose, Bld: 106 mg/dL — ABNORMAL HIGH (ref 70–99)
Potassium: 4.3 mmol/L (ref 3.5–5.1)
Sodium: 126 mmol/L — ABNORMAL LOW (ref 135–145)

## 2024-06-19 LAB — PROTIME-INR
INR: 1.9 — ABNORMAL HIGH (ref 0.8–1.2)
Prothrombin Time: 22.8 s — ABNORMAL HIGH (ref 11.4–15.2)

## 2024-06-19 LAB — GLUCOSE, CAPILLARY
Glucose-Capillary: 137 mg/dL — ABNORMAL HIGH (ref 70–99)
Glucose-Capillary: 127 mg/dL — ABNORMAL HIGH (ref 70–99)
Glucose-Capillary: 120 mg/dL — ABNORMAL HIGH (ref 70–99)
Glucose-Capillary: 117 mg/dL — ABNORMAL HIGH (ref 70–99)

## 2024-06-19 LAB — MAGNESIUM: Magnesium: 2.1 mg/dL (ref 1.7–2.4)

## 2024-06-19 MED ORDER — SODIUM CHLORIDE 1 G PO TABS
2.0000 g | ORAL_TABLET | Freq: Three times a day (TID) | ORAL | Status: DC
  Administered 2024-06-19 – 2024-06-20 (×4): 2 g via ORAL
  Filled 2024-06-19 (×4): qty 2

## 2024-06-19 MED ORDER — WARFARIN SODIUM 5 MG PO TABS
5.0000 mg | ORAL_TABLET | Freq: Once | ORAL | Status: AC
  Administered 2024-06-19: 5 mg via ORAL
  Filled 2024-06-19: qty 1

## 2024-06-19 MED ORDER — FUROSEMIDE 10 MG/ML IJ SOLN
40.0000 mg | Freq: Two times a day (BID) | INTRAMUSCULAR | Status: DC
  Administered 2024-06-19 – 2024-06-20 (×2): 40 mg via INTRAVENOUS
  Filled 2024-06-19 (×2): qty 4

## 2024-06-19 NOTE — Progress Notes (Signed)
 "                                                                                                                                                                                                          Daily Progress Note   Patient Name: [name]       Date: 06/19/2024 DOB: 05/11/1941  Age: 84 y.o. MRN#: [id number] Attending Physician: Cheryle Page, MD Primary Care Physician: [name], MD Admit Date: 06/10/2024  Reason for Consultation/Follow-up: Establishing goals of care and Non pain symptom management  Subjective:  Resting in bed, husband at bedside, appears with generalized weakness Length of Stay: 9  Current Medications: Scheduled Meds:   carvedilol   3.125 mg Oral BID WC   diltiazem   120 mg Oral Q8H   enoxaparin  (LOVENOX ) injection  100 mg Subcutaneous BID   furosemide   40 mg Intravenous BID   insulin  aspart  0-20 Units Subcutaneous TID WC   insulin  aspart  0-5 Units Subcutaneous QHS   levothyroxine   112 mcg Oral Q0600   nystatin    Topical BID   sodium chloride   2 g Oral TID WC   warfarin  5 mg Oral ONCE-1600   Warfarin - Pharmacist Dosing Inpatient   Does not apply q1600    Continuous Infusions:    PRN Meds: acetaminophen , ALPRAZolam , HYDROmorphone  (DILAUDID ) injection, LORazepam , melatonin, ondansetron  (ZOFRAN ) IV, mouth rinse, oxyCODONE , polyethylene glycol, senna  Physical Exam         Awake alert Bilateral LE in dressings Resting in bed Regular breath sounds Abdomen not tender  Vital Signs: BP 124/77 (BP Location: Right Arm)   Pulse 74   Temp (!) 97.5 F (36.4 C) (Oral)   Resp (!) 21   Ht 5' 4 (1.626 m)   Wt 109.1 kg   SpO2 93%   BMI 41.29 kg/m  SpO2: SpO2: 93 % O2 Device: O2 Device: Nasal Cannula O2 Flow Rate: O2 Flow Rate (L/min): 3 L/min  Intake/output summary:  Intake/Output Summary (Last 24 hours) at 06/19/2024 1428 Last data filed at 06/19/2024 1100 Gross per 24 hour  Intake 540 ml  Output 400 ml  Net 140 ml   LBM:  Last BM Date : 06/17/24 Baseline Weight: Weight: 109.3 kg Most recent weight: Weight: 109.1 kg       Palliative Assessment/Data:      Patient Active Problem List   Diagnosis Date Noted   Generalized weakness 06/13/2024   Pericardial effusion 06/13/2024   Warfarin-induced coagulopathy 06/13/2024   Hyperkalemia 06/13/2024   AKI (acute kidney injury) 06/10/2024  Atrial fibrillation with RVR (HCC) 05/12/2024   Atypical chest pain 05/12/2024   Acute on chronic heart failure with preserved ejection fraction (HFpEF) (HCC) 09/26/2022   Permanent atrial fibrillation (HCC) 09/26/2022   Warfarin anticoagulation 09/18/2022   Sepsis (HCC) 09/18/2022   Sepsis due to cellulitis (HCC) 09/18/2022   Lower extremity cellulitis 09/18/2022   Chronic intermittent hypoxia with obstructive sleep apnea 09/12/2021   Severe obstructive sleep apnea-hypopnea syndrome 09/12/2021   Acute medial meniscus tear of right knee 03/31/2018   Acute pain of right knee 01/12/2018   Hypercoagulation syndrome 05/24/2014   Hunter's glossitis 05/24/2014   Sarcoidosis 05/24/2014   Hypoxemia 05/24/2014   Primary hypertension 10/13/2013   Nocturnal hypoxia 05/19/2013   Obstructive sleep apnea (adult) (pediatric) 05/19/2013   Obesity 05/19/2013   OSA on CPAP    Diffuse lymphadenopathy 05/26/2012   Cough 05/20/2012   OSA (obstructive sleep apnea) 05/20/2012   Pulmonary embolism (HCC) 04/29/2012   DVT (deep venous thrombosis) (HCC) 04/29/2012    Palliative Care Assessment & Plan   Patient Profile: 84 year old lady admitted for pericardial effusion She has medical history of atrial fibrillation was on warfarin, sarcoidosis, chronic lower extremity wounds Patient admitted with weakness and low blood pressures This hospital course complicated by Staph epidermidis bacteremia, lower extremity wounds, currently on empiric Zyvox , cardiology also following She has history of obstructive sleep apnea physical  deconditioning functional decline anxiety insomnia Assessment: Anxiety Insomnia Generalized weakness Functional decline   Recommendations/Plan: Goals of care discussions, care coordination Continues with full code.  Care Patient to follow-up with cardiology in the outpatient setting Skilled nursing facility rehabilitation being arranged for by [facility] Recommend outpatient palliative support Generalized pain/lower extremity discomfort-use oral or IV opioids on an as needed basis Remains on melatonin as well as benzodiazepines for anxiety and insomnia     Goals of Care and Additional Recommendations: Limitations on Scope of Treatment: Full Scope Treatment  Code Status:    Code Status Orders  (From admission, onward)           Start     Ordered   06/10/24 1404  Full code  Continuous       Question:  By:  Answer:  Consent: discussion documented in EHR   06/10/24 1404           Code Status History     Date Active Date Inactive Code Status Order ID Comments User Context   05/12/2024 2334 05/16/2024 1907 Full Code [id number]  Fausto Burnard LABOR, DO ED   09/18/2022 1701 09/26/2022 2117 Full Code [id number]  Zella Katha HERO, MD ED      Advance Directive Documentation    Flowsheet Row Most Recent Value  Type of Advance Directive Healthcare Power of Attorney, Living will  Pre-existing out of facility DNR order (yellow form or pink MOST form) --  MOST Form in Place? --    Prognosis:  Unable to determine  Discharge Planning: Skilled Nursing Facility for rehab with Palliative care service follow-up  Care plan was discussed with patient and patient's husband present at the bedside. I personally spent a total of 25 minutes in the care of the patient today including preparing to see the patient, getting/reviewing separately obtained history, performing a medically appropriate exam/evaluation, counseling and educating, documenting clinical information in the EHR, and  coordinating care.   Thank you for allowing the Palliative Medicine Team to assist in the care of this patient.       Greater than 50%  of this  time was spent counseling and coordinating care related to the above assessment and plan.  Lonia Serve, MD  Please contact Palliative Medicine Team phone at [phone] for questions and concerns.       "

## 2024-06-19 NOTE — Progress Notes (Addendum)
 PHARMACY - ANTICOAGULATION CONSULT NOTE  Pharmacy Consult for warfarin Indication: atrial fibrillation, PE  Allergies[1]  Patient Measurements: Height: 5' 4 (162.6 cm) Weight: 109.1 kg (240 lb 8.4 oz) IBW/kg (Calculated) : 54.7 HEPARIN DW (KG): 80.7  Vital Signs: Temp: 98 F (36.7 C) (01/11 0511) Temp Source: Oral (01/11 0511) BP: 129/68 (01/11 0857) Pulse Rate: 64 (01/11 0857)  Labs: Recent Labs    06/17/24 0556 06/17/24 1131 06/18/24 0529 06/19/24 0514  HGB  --   --  10.3* 10.8*  HCT  --   --  31.4* 32.6*  PLT  --   --  227 217  LABPROT  --  18.6* 19.4* 22.8*  INR  --  1.5* 1.5* 1.9*  CREATININE 1.22*  --  1.30* 1.23*    Estimated Creatinine Clearance: 40.4 mL/min (A) (by C-G formula based on SCr of 1.23 mg/dL (H)).   Medical History: Past Medical History:  Diagnosis Date   Anxiety    takes Lorazepam  daily as needed   Arthritis    Cancer (HCC)    thyroid    Cataracts, bilateral    immature   Chronic back pain    compressed vertebrae,takes Fosamax weekly   Family history of adverse reaction to anesthesia    granddaughter gets very sick and mean   History of blood transfusion    no abnormal reaction noted   History of bronchitis 05/2015   History of colon polyps    benign   Hyperlipidemia    takes Zetia  daily   Hypertension    takes Diltiazem  and Avapro  daily   Hypothyroidism    takes Synthroid  daily   Ischemic colitis    hx of-many yrs ago   Joint pain    Joint swelling    OSA on CPAP    05-08-12 AHi was 46, RDI  53. titrated to   9 cm water   with 3 cm flex.-nadir 75%   PE (pulmonary embolism)    takes Coumadin  daily   Peripheral edema    Pneumonia 1992   hx of   Sarcoidosis    Shortness of breath    Urinary frequency    Weakness    left leg.Numbness and tingling.Has sciatica   Wheeze    occaionally. Not new    Medications: Pt chronically anticoagulated with warfarin -Home dose: warfarin 5 mg PO daily -Last dose: 1/1 or 1/2  PTA  Assessment: Pt is an 33 yoF who is chronically anticoagulated with warfarin for history of afib, PE. Pt presented on 1/2 with hypotension, pericardial effusion, supratherapeutic INR.  Warfarin has been held since admission, pt has been on therapeutic enoxaparin  since 1/5. Cardiology recommended transition from warfarin to apixaban , however, pt/spouse have concerns about switching anticoagulant due to it being cost-prohibitive and having to meet deductible. Pharmacy consulted to resume warfarin on 1/9.   Significant Events: -1/2: INR >10. Vitamin K  5 mg IV x1 given. Transfused -1/5  Therapeutic LMWH started -1/9  Resume warfarin, continue LMWH bridge  Today, 06/19/2024 INR increased to 1.9, just below therapeutic range after resuming warfarin CBC: Hgb low/stable, Plt WNL SCr down to 1.23, CrCl > 30 ml/min No major drug-interactions. Linezolid  may have some impact on enhancing effect of warfarin - d/c on 1/11.   Goal of Therapy:  INR 2-3 Monitor platelets by anticoagulation protocol: Yes   Plan:  Warfarin 5 mg PO x1 dose today Pt remains on enoxaparin  bridge of 100 mg subQ q12h while INR subtherapeutic INR daily.  CBC q72h.  Monitor for signs of bleeding    Wanda Hasting PharmD, BCPS [facility] main pharmacy [phone] 06/19/2024 11:09 AM       [1]  Allergies Allergen Reactions   Atenolol Other (See Comments)    Depression    Citalopram Hydrobromide Nausea Only and Other (See Comments)    Nausea/Fatigue   Clindamycin /Lincomycin Other (See Comments)    Via IV, has caused a metallic taste in the mouth    Codeine Other (See Comments)    Caused Hyperactivity and Dysphoria   Coenzyme Q10 Itching   Erythromycin Itching, Anxiety and Other (See Comments)    Also with jitters   Motrin [Ibuprofen] Other (See Comments)    Is to not take this   Statins Other (See Comments)    Leg muscle weakness w/ rosuvastatin and simvastatin    Latex Rash   Losartan Potassium Other (See  Comments)    Muscle aches

## 2024-06-19 NOTE — Progress Notes (Signed)
 " PROGRESS NOTE    [name]  FMW:[id number] DOB: 09/16/1940 DOA: 06/10/2024 PCP: [name], MD   Brief Narrative:  84 year old woman PMH including A-fib on warfarin, sarcoidosis, chronic lower extremity wounds presented with weakness and hypotension.  She was admitted by PCCM to ICU for AKI, hyperkalemia, coagulopathy, moderate pericardial effusion and initially received FFP, vitamin K , Lokelma  and bicarb infusion along with empiric antibiotics.  Echo showed EF of 45 to 50% with large pericardial effusion without tamponade.  Cardiology was consulted.  She was transferred to TRH service from 06/14/2023 onwards.  Subsequently, cardiology has signed off.  PT recommended SNF placement.  Palliative care consulted for goals of care discussion.  Assessment & Plan:   Shock: Present on admission Staph epidermidis bacteremia Lower extremity wounds - Shock has resolved.   - Continue wound care as per wound care consult recommendations -Currently on empiric Zyvox .  Follow-up repeat blood cultures from 06/16/24 are negative so far.  DC Zyvox  after today's doses.  Pericardial effusion - Follow-up echo on 06/14/2024 showed moderate pericardial effusion.  Cardiology signed off on 06/15/2024 and recommended outpatient follow-up with cardiology.  No signs of tamponade.  Mild cardiomyopathy Acute systolic heart failure - Strict input output.  Daily weights.  Fluid restriction.  Continue Coreg .  Increase Lasix  to 40 mg IV every 12 hours.  Leukocytosis - Resolved.    Permanent A-fib - Continue Coreg  and Cardizem .  Rate currently controlled.  Cardiology had recommended to switch to Eliquis .  Patient was on Coumadin  prior to presentation and had issues with apixaban  in the past due to cost.  Will continue with Lovenox  bridging and Coumadin .  Monitor INR.  Outpatient follow-up with PCP and/or cardiologist for further discussions regarding the switch to Eliquis .  AKI Uremia Hyperkalemia:  Resolved Acute metabolic acidosis: Resolved -Secondary to ischemic and prerenal insults from hypotension and prolonged decreased oral intake. Seen by nephrology. Treated with Lokelma  and bicarbonate infusion.  -Renal function has recovered, uremia resolved, potassium within normal limits.  Nephrology has signed off.  ARB remains on hold.  Hyponatremia - Sodium 126 today is with.  Possibly from volume overload.  Diuretic plan as above.  Continue salt tablets.  Monitor  Acute anemia Warfarin coagulopathy Hematochezia on admission - Received vitamin K  and FFP on admission.  Currently hemoglobin stable.  Anticoagulation plan as above.  Hypothyroidism - Continue Synthroid   OSA - Noncompliant to CPAP  Obesity class III - Outpatient follow-up  Physical deconditioning - PT recommending SNF placement.  TOC following  Goals of care - Overall prognosis is guarded to poor.  palliative care consultation appreciated.  Patient remains full code.  DVT prophylaxis: Eliquis   code Status: Full Family Communication: Husband at bedside on 06/18/2024 Disposition Plan: Status is: Inpatient. Remains inpatient appropriate because: Of severity of illness.  Need for SNF placement    Consultants: PCCM/cardiology.  palliative care  Procedures: As above  Antimicrobials:  Anti-infectives (From admission, onward)    Start     Dose/Rate Route Frequency Ordered Stop   06/12/24 1000  linezolid  (ZYVOX ) IVPB 600 mg        600 mg 300 mL/hr over 60 Minutes Intravenous Every 12 hours 06/10/24 1709     06/11/24 1200  ceFEPIme  (MAXIPIME ) 2 g in sodium chloride  0.9 % 100 mL IVPB  Status:  Discontinued        2 g 200 mL/hr over 30 Minutes Intravenous Every 24 hours 06/10/24 1659 06/11/24 1003   06/11/24 1000  linezolid  (  ZYVOX ) IVPB 600 mg  Status:  Discontinued        600 mg 300 mL/hr over 60 Minutes Intravenous Every 12 hours 06/10/24 1415 06/10/24 1709   06/10/24 1245  vancomycin  (VANCOREADY) IVPB 2000  mg/400 mL        2,000 mg 200 mL/hr over 120 Minutes Intravenous  Once 06/10/24 1204 06/10/24 1623   06/10/24 1215  ceFEPIme  (MAXIPIME ) 2 g in sodium chloride  0.9 % 100 mL IVPB        2 g 200 mL/hr over 30 Minutes Intravenous  Once 06/10/24 1203 06/10/24 1251   06/10/24 1215  metroNIDAZOLE  (FLAGYL ) IVPB 500 mg        500 mg 100 mL/hr over 60 Minutes Intravenous  Once 06/10/24 1203 06/10/24 1430   06/10/24 1215  vancomycin  (VANCOCIN ) IVPB 1000 mg/200 mL premix  Status:  Discontinued        1,000 mg 200 mL/hr over 60 Minutes Intravenous  Once 06/10/24 1203 06/10/24 1204        Subjective: Patient seen and examined at bedside.  Denies worsening shortness of breath, chest pain or vomiting.  Feels intermittently anxious.   Objective: Vitals:   06/18/24 2249 06/19/24 0216 06/19/24 0500 06/19/24 0511  BP:    133/67  Pulse: 72   75  Resp: 20 (!) 21    Temp:    98 F (36.7 C)  TempSrc:    Oral  SpO2: 91%   91%  Weight:   109.1 kg   Height:        Intake/Output Summary (Last 24 hours) at 06/19/2024 0743 Last data filed at 06/18/2024 1854 Gross per 24 hour  Intake 240 ml  Output 400 ml  Net -160 ml   Filed Weights   06/17/24 0500 06/18/24 0500 06/19/24 0500  Weight: 111 kg 108.4 kg 109.1 kg    Examination:  General: No acute distress.  Currently on 4 L oxygen  management  ENT/neck: No obvious JVD elevation or palpable neck masses  respiratory: Bilateral decreased breath sounds at bases with scattered crackles CVS: Rate mostly controlled; S1 and S2 heard abdominal: Soft, morbidly obese, nontender, remains slightly distended; no organomegaly, bowel sounds are heard normally Extremities: Bilateral lower extremities are wrapped; no clubbing  CNS: Awake; still slow to respond.  No obvious focal deficits noted Lymph: No obvious palpable lymphadenopathy noted Skin: No obvious petechiae/rashes  psych: Looks anxious intermittently.  Not agitated  musculoskeletal: No obvious joint  tenderness/erythema   Data Reviewed: I have personally reviewed following labs and imaging studies  CBC: Recent Labs  Lab 06/13/24 0249 06/14/24 0512 06/16/24 0612 06/18/24 0529 06/19/24 0514  WBC 10.9*  --  9.1 8.3 7.9  NEUTROABS  --   --   --  5.7 5.8  HGB 9.7* 9.4* 10.5* 10.3* 10.8*  HCT 30.2* 29.2* 32.6* 31.4* 32.6*  MCV 89.9  --  91.1 91.0 91.1  PLT 227  --  182 227 217   Basic Metabolic Panel: Recent Labs  Lab 06/13/24 0249 06/14/24 0512 06/16/24 0612 06/17/24 0556 06/18/24 0529 06/19/24 0514  NA 133* 134* 130* 127* 126* 126*  K 4.0 4.0 4.4 4.5 4.4 4.3  CL 98 98 93* 91* 90* 89*  CO2 27 29 23 26 28 28   GLUCOSE 89 88 86 86 103* 106*  BUN 63* 41* 27* 30* 29* 27*  CREATININE 1.26* 1.00 1.12* 1.22* 1.30* 1.23*  CALCIUM  8.1* 8.3* 8.7* 8.8* 8.7* 8.7*  MG 2.7*  --  2.4 2.5* 2.4  2.1  PHOS 2.5  --   --   --   --   --    GFR: Estimated Creatinine Clearance: 40.4 mL/min (A) (by C-G formula based on SCr of 1.23 mg/dL (H)). Liver Function Tests: Recent Labs  Lab 06/16/24 0612  AST 26  ALT 17  ALKPHOS 140*  BILITOT 0.8  PROT 6.9  ALBUMIN 2.6*   No results for input(s): LIPASE, AMYLASE in the last 168 hours. No results for input(s): AMMONIA in the last 168 hours. Coagulation Profile: Recent Labs  Lab 06/13/24 0249 06/17/24 1131 06/18/24 0529 06/19/24 0514  INR 1.5* 1.5* 1.5* 1.9*   Cardiac Enzymes: No results for input(s): CKTOTAL, CKMB, CKMBINDEX, TROPONINI in the last 168 hours. BNP (last 3 results) Recent Labs    05/12/24 1806 06/10/24 0824  PROBNP 1,710.0* 6,454.0*   HbA1C: No results for input(s): HGBA1C in the last 72 hours. CBG: Recent Labs  Lab 06/18/24 0733 06/18/24 1159 06/18/24 1650 06/18/24 1937 06/18/24 2158  GLUCAP 108* 188* 137* 117* 95   Lipid Profile: No results for input(s): CHOL, HDL, LDLCALC, TRIG, CHOLHDL, LDLDIRECT in the last 72 hours. Thyroid  Function Tests: No results for input(s):  TSH, T4TOTAL, FREET4, T3FREE, THYROIDAB in the last 72 hours. Anemia Panel: No results for input(s): VITAMINB12, FOLATE, FERRITIN, TIBC, IRON, RETICCTPCT in the last 72 hours. Sepsis Labs: No results for input(s): PROCALCITON, LATICACIDVEN in the last 168 hours.   Recent Results (from the past 240 hours)  Blood culture (routine x 2)     Status: Abnormal   Collection Time: 06/10/24  8:17 AM   Specimen: BLOOD  Result Value Ref Range Status   Specimen Description   Final    BLOOD RIGHT ANTECUBITAL Performed at [facility], [address], [place], [place] [address]    Special Requests   Final    BOTTLES DRAWN AEROBIC AND ANAEROBIC Blood Culture adequate volume Performed at [facility], [address], [place], [place] [address]    Culture  Setup Time   Final    GRAM POSITIVE COCCI IN BOTH AEROBIC AND ANAEROBIC BOTTLES CRITICAL RESULT CALLED TO, READ BACK BY AND VERIFIED WITH: PHARMD NICK G. [id number] AT 0930, ADC    Culture (A)  Final    STAPHYLOCOCCUS EPIDERMIDIS THE SIGNIFICANCE OF ISOLATING THIS ORGANISM FROM A SINGLE SET OF BLOOD CULTURES WHEN MULTIPLE SETS ARE DRAWN IS UNCERTAIN. PLEASE NOTIFY THE MICROBIOLOGY DEPARTMENT WITHIN ONE WEEK IF SPECIATION AND SENSITIVITIES ARE REQUIRED. Performed at [facility], [address] [place], [place] [address]    Report Status 06/13/2024 FINAL  Final  Blood Culture ID Panel (Reflexed)     Status: Abnormal   Collection Time: 06/10/24  8:17 AM  Result Value Ref Range Status   Enterococcus faecalis NOT DETECTED NOT DETECTED Final   Enterococcus Faecium NOT DETECTED NOT DETECTED Final   Listeria monocytogenes NOT DETECTED NOT DETECTED Final   Staphylococcus species DETECTED (A) NOT DETECTED Final    Comment: CRITICAL RESULT CALLED TO, READ BACK BY AND VERIFIED WITH: PHARMD NICK G. [id number] AT 0930, ADC    Staphylococcus aureus (BCID) NOT DETECTED NOT DETECTED Final    Staphylococcus epidermidis DETECTED (A) NOT DETECTED Final    Comment: Methicillin (oxacillin) resistant coagulase negative staphylococcus. Possible blood culture contaminant (unless isolated from more than one blood culture draw or clinical case suggests pathogenicity). No antibiotic treatment is indicated for blood  culture contaminants. CRITICAL RESULT CALLED TO, READ BACK BY AND VERIFIED  WITH: PHARMD NICK G. [id number] AT 0930, ADC    Staphylococcus lugdunensis NOT DETECTED NOT DETECTED Final   Streptococcus species NOT DETECTED NOT DETECTED Final   Streptococcus agalactiae NOT DETECTED NOT DETECTED Final   Streptococcus pneumoniae NOT DETECTED NOT DETECTED Final   Streptococcus pyogenes NOT DETECTED NOT DETECTED Final   A.calcoaceticus-baumannii NOT DETECTED NOT DETECTED Final   Bacteroides fragilis NOT DETECTED NOT DETECTED Final   Enterobacterales NOT DETECTED NOT DETECTED Final   Enterobacter cloacae complex NOT DETECTED NOT DETECTED Final   Escherichia coli NOT DETECTED NOT DETECTED Final   Klebsiella aerogenes NOT DETECTED NOT DETECTED Final   Klebsiella oxytoca NOT DETECTED NOT DETECTED Final   Klebsiella pneumoniae NOT DETECTED NOT DETECTED Final   Proteus species NOT DETECTED NOT DETECTED Final   Salmonella species NOT DETECTED NOT DETECTED Final   Serratia marcescens NOT DETECTED NOT DETECTED Final   Haemophilus influenzae NOT DETECTED NOT DETECTED Final   Neisseria meningitidis NOT DETECTED NOT DETECTED Final   Pseudomonas aeruginosa NOT DETECTED NOT DETECTED Final   Stenotrophomonas maltophilia NOT DETECTED NOT DETECTED Final   Candida albicans NOT DETECTED NOT DETECTED Final   Candida auris NOT DETECTED NOT DETECTED Final   Candida glabrata NOT DETECTED NOT DETECTED Final   Candida krusei NOT DETECTED NOT DETECTED Final   Candida parapsilosis NOT DETECTED NOT DETECTED Final   Candida tropicalis NOT DETECTED NOT DETECTED Final   Cryptococcus neoformans/gattii NOT DETECTED  NOT DETECTED Final   Methicillin resistance mecA/C DETECTED (A) NOT DETECTED Final    Comment: CRITICAL RESULT CALLED TO, READ BACK BY AND VERIFIED WITH: PHARMD NICK G. [id number] AT 0930, ADC Performed at [facility], [address], [place], [place] [address]   Respiratory (~20 pathogens) panel by PCR     Status: None   Collection Time: 06/10/24  3:27 PM   Specimen: Nasopharyngeal Swab; Respiratory  Result Value Ref Range Status   Adenovirus NOT DETECTED NOT DETECTED Final   Coronavirus 229E NOT DETECTED NOT DETECTED Final    Comment: (NOTE) The Coronavirus on the Respiratory Panel, DOES NOT test for the novel  Coronavirus (2019 nCoV)    Coronavirus HKU1 NOT DETECTED NOT DETECTED Final   Coronavirus NL63 NOT DETECTED NOT DETECTED Final   Coronavirus OC43 NOT DETECTED NOT DETECTED Final   Metapneumovirus NOT DETECTED NOT DETECTED Final   Rhinovirus / Enterovirus NOT DETECTED NOT DETECTED Final   Influenza A NOT DETECTED NOT DETECTED Final   Influenza B NOT DETECTED NOT DETECTED Final   Parainfluenza Virus 1 NOT DETECTED NOT DETECTED Final   Parainfluenza Virus 2 NOT DETECTED NOT DETECTED Final   Parainfluenza Virus 3 NOT DETECTED NOT DETECTED Final   Parainfluenza Virus 4 NOT DETECTED NOT DETECTED Final   Respiratory Syncytial Virus NOT DETECTED NOT DETECTED Final   Bordetella pertussis NOT DETECTED NOT DETECTED Final   Bordetella Parapertussis NOT DETECTED NOT DETECTED Final   Chlamydophila pneumoniae NOT DETECTED NOT DETECTED Final   Mycoplasma pneumoniae NOT DETECTED NOT DETECTED Final    Comment: Performed at [facility], [address], [place], [place] [address]  Resp panel by RT-PCR (RSV, Flu A&B, Covid) Nasal Mucosa     Status: None   Collection Time: 06/10/24  3:27 PM   Specimen: Nasal Mucosa; Nasal Swab  Result Value Ref Range Status   SARS Coronavirus 2 by RT PCR NEGATIVE NEGATIVE Final    Comment: (NOTE) SARS-CoV-2 target nucleic acids are NOT  DETECTED.  The SARS-CoV-2  RNA is generally detectable in upper respiratory specimens during the acute phase of infection. The lowest concentration of SARS-CoV-2 viral copies this assay can detect is 138 copies/mL. A negative result does not preclude SARS-Cov-2 infection and should not be used as the sole basis for treatment or other patient management decisions. A negative result may occur with  improper specimen collection/handling, submission of specimen other than nasopharyngeal swab, presence of viral mutation(s) within the areas targeted by this assay, and inadequate number of viral copies(<138 copies/mL). A negative result must be combined with clinical observations, patient history, and epidemiological information. The expected result is Negative.  Fact Sheet for Patients:  bloggercourse.com  Fact Sheet for Healthcare Providers:  seriousbroker.it  This test is no t yet approved or cleared by the [place]  FDA and  has been authorized for detection and/or diagnosis of SARS-CoV-2 by FDA under an Emergency Use Authorization (EUA). This EUA will remain  in effect (meaning this test can be used) for the duration of the COVID-19 declaration under Section 564(b)(1) of the Act, 21 U.S.C.section 360bbb-3(b)(1), unless the authorization is terminated  or revoked sooner.       Influenza A by PCR NEGATIVE NEGATIVE Final   Influenza B by PCR NEGATIVE NEGATIVE Final    Comment: (NOTE) The Xpert Xpress SARS-CoV-2/FLU/RSV plus assay is intended as an aid in the diagnosis of influenza from Nasopharyngeal swab specimens and should not be used as a sole basis for treatment. Nasal washings and aspirates are unacceptable for Xpert Xpress SARS-CoV-2/FLU/RSV testing.  Fact Sheet for Patients: bloggercourse.com  Fact Sheet for Healthcare Providers: seriousbroker.it  This test is not yet  approved or cleared by the [place]  FDA and has been authorized for detection and/or diagnosis of SARS-CoV-2 by FDA under an Emergency Use Authorization (EUA). This EUA will remain in effect (meaning this test can be used) for the duration of the COVID-19 declaration under Section 564(b)(1) of the Act, 21 U.S.C. section 360bbb-3(b)(1), unless the authorization is terminated or revoked.     Resp Syncytial Virus by PCR NEGATIVE NEGATIVE Final    Comment: (NOTE) Fact Sheet for Patients: bloggercourse.com  Fact Sheet for Healthcare Providers: seriousbroker.it  This test is not yet approved or cleared by the [place]  FDA and has been authorized for detection and/or diagnosis of SARS-CoV-2 by FDA under an Emergency Use Authorization (EUA). This EUA will remain in effect (meaning this test can be used) for the duration of the COVID-19 declaration under Section 564(b)(1) of the Act, 21 U.S.C. section 360bbb-3(b)(1), unless the authorization is terminated or revoked.  Performed at [facility], [address] [place], [place] [address]   MRSA Next Gen by PCR, Nasal     Status: None   Collection Time: 06/10/24  3:36 PM   Specimen: Nasal Mucosa; Nasal Swab  Result Value Ref Range Status   MRSA by PCR Next Gen NOT DETECTED NOT DETECTED Final    Comment: (NOTE) The GeneXpert MRSA Assay (FDA approved for NASAL specimens only), is one component of a comprehensive MRSA colonization surveillance program. It is not intended to diagnose MRSA infection nor to guide or monitor treatment for MRSA infections. Test performance is not FDA approved in patients less than 72 years old. Performed at [facility], [address], [place], [place] [address]   Blood culture (routine x 2)     Status: None   Collection Time: 06/10/24  4:41 PM   Specimen: BLOOD LEFT HAND  Result Value  Ref Range Status    Specimen Description   Final    BLOOD LEFT HAND BOTTLES DRAWN AEROBIC ONLY Performed at [facility], [address] [place], [place] [address]    Special Requests   Final    Blood Culture results may not be optimal due to an inadequate volume of blood received in culture bottles Performed at [facility], [address] [place], [place] [address]    Culture   Final    NO GROWTH 5 DAYS Performed at [facility], [address], [place], [place] [address]    Report Status 06/15/2024 FINAL  Final  Culture, blood (Routine X 2) w Reflex to ID Panel     Status: None (Preliminary result)   Collection Time: 06/16/24  2:22 PM   Specimen: BLOOD LEFT HAND  Result Value Ref Range Status   Specimen Description   Final    BLOOD LEFT HAND Performed at [facility], [address] [place], [place] [address]    Special Requests   Final    BOTTLES DRAWN AEROBIC AND ANAEROBIC Blood Culture adequate volume Performed at [facility], [address], [place], [place] [address]    Culture   Final    NO GROWTH 2 DAYS Performed at [facility], [address], [place], [place] [address]    Report Status PENDING  Incomplete  Culture, blood (Routine X 2) w Reflex to ID Panel     Status: None (Preliminary result)   Collection Time: 06/16/24  2:27 PM   Specimen: BLOOD LEFT HAND  Result Value Ref Range Status   Specimen Description   Final    BLOOD LEFT HAND Performed at [facility], [address], [place], [place] [address]    Special Requests   Final    BOTTLES DRAWN AEROBIC AND ANAEROBIC Blood Culture adequate volume Performed at [facility], [address] [place], [place] [address]    Culture   Final    NO GROWTH 2 DAYS Performed at [facility], [address] [place], [place] [address]    Report Status PENDING  Incomplete         Radiology Studies: No results  found.       Scheduled Meds:  carvedilol   3.125 mg Oral BID WC   diltiazem   120 mg Oral Q8H   enoxaparin  (LOVENOX ) injection  100 mg Subcutaneous BID   furosemide   40 mg Intravenous Daily   insulin  aspart  0-20 Units Subcutaneous TID WC   insulin  aspart  0-5 Units Subcutaneous QHS   levothyroxine   112 mcg Oral Q0600   nystatin    Topical BID   sodium chloride   1 g Oral TID WC   Warfarin - Pharmacist Dosing Inpatient   Does not apply q1600   Continuous Infusions:  linezolid  (ZYVOX ) IV 600 mg (06/18/24 2219)          Sophie Mao, MD [facility] 06/19/2024, 7:43 AM   "

## 2024-06-19 NOTE — Progress Notes (Signed)
 Mobility Specialist - Progress Note  ([place] 5L) Pre-mobility: 75 bpm 93% SpO2 During mobility: 92 bpm HR, 86% SpO2 Post-mobility: 77 bpm HR, 94% SPO2   06/19/24 0844  Mobility  Activity Ambulated with assistance;Pivoted/transferred from bed to chair  Level of Assistance Minimal assist, patient does 75% or more  Assistive Device Front wheel walker  Distance Ambulated (ft) 3 ft  Range of Motion/Exercises Active  Activity Response Tolerated fair  Mobility visit 1 Mobility  Mobility Specialist Start Time (ACUTE ONLY) 0815  Mobility Specialist Stop Time (ACUTE ONLY) 0844  Mobility Specialist Time Calculation (min) (ACUTE ONLY) 29 min   Pt was found in bed and agreeable to mobilize. Grew SOB with mobilization and encouraged pursed lip breathing. Assisted to [facility] prior to recliner chair. Able to increase SPO2 >90% within 1 min. At EOS was left on recliner chair with all needs met. Call bell in reach and chair alarm on.   Erminio Leos,  Mobility Specialist Can be reached via Secure Chat

## 2024-06-20 ENCOUNTER — Encounter (HOSPITAL_COMMUNITY)

## 2024-06-20 DIAGNOSIS — N179 Acute kidney failure, unspecified: Secondary | ICD-10-CM | POA: Diagnosis not present

## 2024-06-20 LAB — BASIC METABOLIC PANEL WITH GFR
Anion gap: 9 (ref 5–15)
BUN: 25 mg/dL — ABNORMAL HIGH (ref 8–23)
CO2: 29 mmol/L (ref 22–32)
Calcium: 8.5 mg/dL — ABNORMAL LOW (ref 8.9–10.3)
Chloride: 90 mmol/L — ABNORMAL LOW (ref 98–111)
Creatinine, Ser: 1.01 mg/dL — ABNORMAL HIGH (ref 0.44–1.00)
GFR, Estimated: 54 mL/min — ABNORMAL LOW
Glucose, Bld: 87 mg/dL (ref 70–99)
Potassium: 4.1 mmol/L (ref 3.5–5.1)
Sodium: 128 mmol/L — ABNORMAL LOW (ref 135–145)

## 2024-06-20 LAB — GLUCOSE, CAPILLARY
Glucose-Capillary: 94 mg/dL (ref 70–99)
Glucose-Capillary: 134 mg/dL — ABNORMAL HIGH (ref 70–99)
Glucose-Capillary: 114 mg/dL — ABNORMAL HIGH (ref 70–99)
Glucose-Capillary: 167 mg/dL — ABNORMAL HIGH (ref 70–99)
Glucose-Capillary: 95 mg/dL (ref 70–99)
Glucose-Capillary: 147 mg/dL — ABNORMAL HIGH (ref 70–99)

## 2024-06-20 LAB — MAGNESIUM: Magnesium: 1.9 mg/dL (ref 1.7–2.4)

## 2024-06-20 LAB — PROTIME-INR
INR: 2.2 — ABNORMAL HIGH (ref 0.8–1.2)
Prothrombin Time: 25.4 s — ABNORMAL HIGH (ref 11.4–15.2)

## 2024-06-20 MED ORDER — POLYETHYLENE GLYCOL 3350 17 G PO PACK: 17.0000 g | PACK | Freq: Every day | ORAL | 0 refills | Status: AC | PRN

## 2024-06-20 MED ORDER — MELATONIN 5 MG PO TABS: 5.0000 mg | ORAL_TABLET | Freq: Every evening | ORAL | Status: AC | PRN

## 2024-06-20 MED ORDER — DILTIAZEM HCL 120 MG PO TABS: 120.0000 mg | ORAL_TABLET | Freq: Three times a day (TID) | ORAL | 0 refills | Status: DC

## 2024-06-20 MED ORDER — LORAZEPAM 1 MG PO TABS: 1.5000 mg | ORAL_TABLET | ORAL | 0 refills | Status: DC

## 2024-06-20 MED ORDER — CARVEDILOL 3.125 MG PO TABS: 3.1250 mg | ORAL_TABLET | Freq: Two times a day (BID) | ORAL | 0 refills | Status: DC

## 2024-06-20 MED ORDER — SODIUM CHLORIDE 1 G PO TABS: 1.0000 g | ORAL_TABLET | Freq: Three times a day (TID) | ORAL | 0 refills | Status: DC

## 2024-06-20 NOTE — TOC Transition Note (Signed)
 Transition of Care [facility]) - Discharge Note   Patient Details  Name: [name] MRN: [id number] Date of Birth: [birth year]  [facility]) CM/SW Contact:  Bascom Service, RN Phone Number: 06/20/2024, 11:34 AM   Clinical Narrative: d/c [facility] accepted,has bed-going to rm#[id number],report tel#[phone]. PTAR called No further CM needs.      Final next level of care: Skilled Nursing Facility Barriers to Discharge: No Barriers Identified   Patient Goals and CMS Choice Patient states their goals for this hospitalization and ongoing recovery are:: Rehab CMS Medicare.gov Compare Post Acute Care list provided to:: Patient Choice offered to / list presented to : Patient [place] ownership interest in [facility].provided to:: Patient    Discharge Placement              Patient chooses bed at: [facility] Patient to be transferred to facility by: PTAR Name of family member notified: [name](spouse) Patient and family notified of of transfer: 06/20/24  Discharge Plan and Services Additional resources added to the After Visit Summary for   In-house Referral: NA Discharge Planning Services: CM Consult Post Acute Care Choice: Skilled Nursing Facility          DME Arranged: N/A DME Agency: NA       HH Arranged: NA HH Agency: NA        Social Drivers of Health (SDOH) Interventions SDOH Screenings   Food Insecurity: No Food Insecurity (06/10/2024)  Housing: Low Risk (06/10/2024)  Transportation Needs: No Transportation Needs (06/10/2024)  Utilities: Not At Risk (06/10/2024)  Depression (PHQ2-9): Low Risk (05/27/2024)  Social Connections: Moderately Integrated (06/10/2024)  Tobacco Use: Low Risk (06/10/2024)     Readmission Risk Interventions    06/13/2024   11:12 AM 09/19/2022    2:13 PM  Readmission Risk Prevention Plan  Post Dischage Appt  Complete  Medication Screening  Complete  Transportation Screening Complete   PCP or  Specialist Appt within 3-5 Days Complete   HRI or Home Care Consult Complete   Social Work Consult for Recovery Care Planning/Counseling Complete   Palliative Care Screening Not Applicable   Medication Review Oceanographer) Complete

## 2024-06-20 NOTE — Discharge Summary (Signed)
 Physician Discharge Summary  LEOTA MAKA FMW:[id number] DOB: 09-Apr-1941 DOA: 06/10/2024  PCP: [name], MD  Admit date: 06/10/2024 Discharge date: 06/20/2024  Admitted From: Home Disposition: SNF  Recommendations for Outpatient Follow-up:  Follow up with SNF provider at earliest convenience with repeat CBC/BMP/INR in the next few days Outpatient follow-up with cardiology and palliative care Follow up in ED if symptoms worsen or new appear   Home Health: No Equipment/Devices: None  Discharge Condition: Guarded CODE STATUS: Full Diet recommendation: Heart healthy/fluid restriction of up to 1200 cc a day  Brief/Interim Summary: 84 year old woman PMH including A-fib on warfarin, sarcoidosis, chronic lower extremity wounds presented with weakness and hypotension.  She was admitted by PCCM to ICU for AKI, hyperkalemia, coagulopathy, moderate pericardial effusion and initially received FFP, vitamin K , Lokelma  and bicarb infusion along with empiric antibiotics.  Echo showed EF of 45 to 50% with large pericardial effusion without tamponade.  Cardiology was consulted.  She was transferred to [facility] service from 06/14/2023 onwards.  Subsequently, cardiology has signed off.  PT recommended SNF placement.  Palliative care consulted for goals of care discussion.  Patient currently remains full code.  She has been started on IV Lasix  and oral salt tablets for volume overload and hyponatremia.  Sodium level improving.  She is currently medically stable for discharge.  She is off antibiotics.  Repeat blood cultures have been negative so far.  She will be discharged to SNF once bed is available.    Discharge Diagnoses:   Shock: Present on admission Staph epidermidis bacteremia Lower extremity wounds - Shock has resolved.   - Continue wound care as per wound care consult recommendations - Completed 7 days of IV Zyvox  and subsequently discontinued.  Follow-up repeat blood cultures from 06/16/24  are negative so far.   - Discharge patient to SNF today.  Currently medically stable for discharge.  Can   Pericardial effusion - Follow-up echo on 06/14/2024 showed moderate pericardial effusion.  Cardiology signed off on 06/15/2024 and recommended outpatient follow-up with cardiology.  No signs of tamponade.   Mild cardiomyopathy Acute systolic heart failure - Improving.  Continue diet and fluid restriction.  Continue Coreg .  Currently on IV Lasix .  Switch to oral Lasix  40 mg daily on discharge.  Outpatient follow-up with cardiology.  Leukocytosis - Resolved.     Permanent A-fib - Continue Coreg  and Cardizem .  Rate currently controlled.  Cardiology had recommended to switch to Eliquis .  Patient was on Coumadin  prior to presentation and had issues with apixaban  in the past due to cost.  Patient required Lovenox  bridging and Coumadin  in the hospital.  INR 2.2 today.  Continue Coumadin  on discharge.  Outpatient follow-up of INR.  Outpatient follow-up with PCP and/or cardiologist for further discussions regarding the switch to Eliquis .   AKI Uremia Hyperkalemia: Resolved Acute metabolic acidosis: Resolved -Secondary to ischemic and prerenal insults from hypotension and prolonged decreased oral intake. Seen by nephrology. Treated with Lokelma  and bicarbonate infusion.  -Renal function has recovered, uremia resolved, potassium within normal limits.  Nephrology has signed off.  ARB remains on hold.   Hyponatremia - Sodium 128 today is with.  Possibly from volume overload.  Diuretic plan as above.  Continue salt tablets on discharge.  Outpatient follow-up of sodium.   Acute anemia Warfarin coagulopathy Hematochezia on admission - Received vitamin K  and FFP on admission.  Currently hemoglobin stable.  Anticoagulation plan as above.   Hypothyroidism - Continue Synthroid    OSA - Noncompliant to CPAP  Obesity class III - Outpatient follow-up   Physical deconditioning - PT recommending SNF  placement.  TOC following   Goals of care - Overall prognosis is guarded to poor.  palliative care consultation appreciated.  Patient remains full code.  Outpatient follow-up with palliative care   Discharge Instructions  Discharge Instructions     Ambulatory referral to Cardiology   Complete by: As directed    Discharge wound care:   Complete by: As directed    As per wound care RN recommendations    Wound care  Daily      Comments: Apply Xeroform gauze to bilat leg wounds Q M/W/F, then cover with abd pads and kerlex, beginning just behind toes to below knees, then ace wrap in the same fashion. Moisten previous dressing with NS to assist with removal each time.   Increase activity slowly   Complete by: As directed       Allergies as of 06/20/2024       Reactions   Atenolol Other (See Comments)   Depression   Citalopram Hydrobromide Nausea Only, Other (See Comments)   Nausea/Fatigue   Clindamycin /lincomycin Other (See Comments)   Via IV, has caused a metallic taste in the mouth    Codeine Other (See Comments)   Caused Hyperactivity and Dysphoria   Coenzyme Q10 Itching   Erythromycin Itching, Anxiety, Other (See Comments)   Also with jitters   Motrin [ibuprofen] Other (See Comments)   Is to not take this   Statins Other (See Comments)   Leg muscle weakness w/ rosuvastatin and simvastatin    Latex Rash   Losartan Potassium Other (See Comments)   Muscle aches        Medication List     STOP taking these medications    diltiazem  360 MG 24 hr capsule Commonly known as: TIAZAC    irbesartan  75 MG tablet Commonly known as: AVAPRO    nebivolol  5 MG tablet Commonly known as: BYSTOLIC        TAKE these medications    acetaminophen  325 MG tablet Commonly known as: TYLENOL  Take 325-650 mg by mouth 2 (two) times daily as needed for mild pain or headache.   B-12 PO Take 1 tablet by mouth daily.   carvedilol  3.125 MG tablet Commonly known as: COREG  Take 1  tablet (3.125 mg total) by mouth 2 (two) times daily with a meal.   diltiazem  120 MG tablet Commonly known as: CARDIZEM  Take 1 tablet (120 mg total) by mouth every 8 (eight) hours.   furosemide  40 MG tablet Commonly known as: LASIX  Take 40 mg by mouth daily.   levothyroxine  112 MCG tablet Commonly known as: SYNTHROID  Take 112 mcg by mouth every morning.   LORazepam  1 MG tablet Commonly known as: ATIVAN  Take 1.5 tablets (1.5 mg total) by mouth See admin instructions. Take 1.5 mg by mouth at bedtime and an additional 1-1.5mg  once a day as needed for anxiety   melatonin 5 MG Tabs Take 1 tablet (5 mg total) by mouth at bedtime as needed (if lorazepam  doesn't work).   nystatin  cream Commonly known as: MYCOSTATIN  Apply 1 Application topically 2 (two) times daily as needed for dry skin.   polyethylene glycol 17 g packet Commonly known as: MIRALAX  / GLYCOLAX  Take 17 g by mouth daily as needed for mild constipation.   REFRESH LIQUIGEL OP Place 1 drop into both eyes as needed (Dry eye).   senna-docusate 8.6-50 MG tablet Commonly known as: Senokot-S Take 1-2 tablets by mouth 2 (  two) times daily between meals as needed for mild constipation or moderate constipation.   SIMPLY SALINE NA Place 1 spray into both nostrils as needed (for dryness).   sodium chloride  1 g tablet Take 1 tablet (1 g total) by mouth 3 (three) times daily with meals.   triamcinolone cream 0.1 % Commonly known as: KENALOG Apply 1 Application topically 2 (two) times daily as needed (Irritation).   warfarin 5 MG tablet Commonly known as: COUMADIN  Take 5 mg by mouth at bedtime.               Discharge Care Instructions  (From admission, onward)           Start     Ordered   06/20/24 0000  Discharge wound care:       Comments: As per wound care RN recommendations    Wound care  Daily      Comments: Apply Xeroform gauze to bilat leg wounds Q M/W/F, then cover with abd pads and kerlex, beginning  just behind toes to below knees, then ace wrap in the same fashion. Moisten previous dressing with NS to assist with removal each time.   06/20/24 1045            Contact information for after-discharge care     Destination     [facility] .   Service: Skilled Nursing Contact information: [address] [place]  [address] [phone]                    Allergies[1]  Consultations: Cardiology/palliative care/nephrology   Procedures/Studies: ECHOCARDIOGRAM LIMITED Result Date: 06/14/2024    ECHOCARDIOGRAM LIMITED REPORT   Patient Name:   JONICA BICKHART Runkel Date of Exam: 06/14/2024 Medical Rec #:  [id number]            Height:       64.0 in Accession #:    [id number]           Weight:       237.4 lb Date of Birth:  1941-02-18            BSA:          2.104 m Patient Age:    83 years             BP:           118/68 mmHg Patient Gender: F                    HR:           79 bpm. Exam Location:  Inpatient Procedure: Limited Echo, Cardiac Doppler and Color Doppler (Both Spectral and            Color Flow Doppler were utilized during procedure). Indications:    Pericardial Effusion  History:        Patient has prior history of Echocardiogram examinations, most                 recent 06/10/2024. Signs/Symptoms:Bacteremia; Risk                 Factors:Hypertension and Sleep Apnea.  Sonographer:    Philomena Daring Referring Phys: [id number] BRIAN S CRENSHAW IMPRESSIONS  1. Left ventricular ejection fraction, by estimation, is 50%. The left ventricle has mildly decreased function.  2. Right ventricular systolic function is mildly reduced. The right ventricular size is normal.  3. Moderate pericardial effusion. The pericardial effusion is posterior to the left  ventricle.  4. The mitral valve is normal in structure. No evidence of mitral valve regurgitation. No evidence of mitral stenosis.  5. Aortic valve regurgitation is not visualized.  6. The inferior vena cava is dilated in  size with <50% respiratory variability, suggesting right atrial pressure of 20 mmHg. Comparison(s): Prior images reviewed side by side. Pericardial effusion has improved. IVC plethora remains; respirophasic variability not optimized in this study. FINDINGS  Left Ventricle: Left ventricular ejection fraction, by estimation, is 50%. The left ventricle has mildly decreased function. Right Ventricle: The right ventricular size is normal. Right ventricular systolic function is mildly reduced. Pericardium: A moderately sized pericardial effusion is present. The pericardial effusion is posterior to the left ventricle. Mitral Valve: The mitral valve is normal in structure. No evidence of mitral valve stenosis. Aortic Valve: Aortic valve regurgitation is not visualized. Pulmonic Valve: The pulmonic valve was not well visualized. Pulmonic valve regurgitation is not visualized. No evidence of pulmonic stenosis. Venous: The inferior vena cava is dilated in size with less than 50% respiratory variability, suggesting right atrial pressure of 15 mmHg. Additional Comments: Spectral Doppler performed. Color Doppler performed.  IVC IVC diam: 2.70 cm Stanly Leavens MD Electronically signed by Stanly Leavens MD Signature Date/Time: 06/14/2024/2:13:41 PM    Final    ECHOCARDIOGRAM COMPLETE Result Date: 06/10/2024    ECHOCARDIOGRAM REPORT   Patient Name:   PHINLEY SCHALL Selle Date of Exam: 06/10/2024 Medical Rec #:  [id number]            Height:       64.0 in Accession #:    [id number]           Weight:       241.0 lb Date of Birth:  05-26-1941            BSA:          2.118 m Patient Age:    83 years             BP:           91/65 mmHg Patient Gender: F                    HR:           101 bpm. Exam Location:  Inpatient Procedure: 2D Echo, Cardiac Doppler and Color Doppler (Both Spectral and Color            Flow Doppler were utilized during procedure). REPORT CONTAINS CRITICAL RESULT Indications:    I31.3 Pericardial  effusion (noninflammatory)  History:        Patient has prior history of Echocardiogram examinations, most                 recent 05/13/2024. Abnormal ECG, Arrythmias:Atrial Fibrillation,                 Signs/Symptoms:Bacteremia, Chest Pain, Shortness of Breath and                 Dyspnea; Risk Factors:Sleep Apnea and Hypertension. Pulmonary                 embolus.  Sonographer:    Ellouise Mose RDCS Referring Phys: [phone] KEVIN STEINL  Sonographer Comments: Patient is obese and Technically difficult study due to poor echo windows. Image acquisition challenging due to patient body habitus. Critical care team evaluating patient during exam, patient conversing with them. IMPRESSIONS  1. Left ventricular ejection fraction, by estimation, is 45 to 50%. The left  ventricle has mildly decreased function. The left ventricle has no regional wall motion abnormalities. Left ventricular diastolic parameters are indeterminate.  2. Right ventricular systolic function is mildly reduced. The right ventricular size is normal. There is normal pulmonary artery systolic pressure. The estimated right ventricular systolic pressure is 25.3 mmHg.  3. Pericardial effusion measures 2.19cm at short axis pap level and .77cm around the RV. Primarily posterior. No RA or RV collapse. Large pericardial effusion. There is no evidence of cardiac tamponade.  4. The mitral valve is normal in structure. Trivial mitral valve regurgitation. No evidence of mitral stenosis.  5. The aortic valve is tricuspid. Aortic valve regurgitation is not visualized. No aortic stenosis is present.  6. The inferior vena cava is dilated in size with <50% respiratory variability, suggesting right atrial pressure of 15 mmHg. FINDINGS  Left Ventricle: Left ventricular ejection fraction, by estimation, is 45 to 50%. The left ventricle has mildly decreased function. The left ventricle has no regional wall motion abnormalities. The left ventricular internal cavity size was normal in  size. There is no left ventricular hypertrophy. Left ventricular diastolic parameters are indeterminate. Right Ventricle: The right ventricular size is normal. No increase in right ventricular wall thickness. Right ventricular systolic function is mildly reduced. There is normal pulmonary artery systolic pressure. The tricuspid regurgitant velocity is 2.36 m/s, and with an assumed right atrial pressure of 3 mmHg, the estimated right ventricular systolic pressure is 25.3 mmHg. Left Atrium: Left atrial size was normal in size. Right Atrium: Right atrial size was normal in size. Pericardium: Pericardial effusion measures 2.19cm at short axis pap level and .77cm around the RV. Primarily posterior. No RA or RV collapse. A large pericardial effusion is present. There is no evidence of cardiac tamponade. Presence of epicardial fat layer. Mitral Valve: The mitral valve is normal in structure. Trivial mitral valve regurgitation. No evidence of mitral valve stenosis. Tricuspid Valve: The tricuspid valve is normal in structure. Tricuspid valve regurgitation is mild . No evidence of tricuspid stenosis. Aortic Valve: The aortic valve is tricuspid. Aortic valve regurgitation is not visualized. No aortic stenosis is present. Pulmonic Valve: The pulmonic valve was normal in structure. Pulmonic valve regurgitation is not visualized. No evidence of pulmonic stenosis. Aorta: The aortic root and ascending aorta are structurally normal, with no evidence of dilitation. Venous: The inferior vena cava is dilated in size with less than 50% respiratory variability, suggesting right atrial pressure of 15 mmHg. IAS/Shunts: No atrial level shunt detected by color flow Doppler.  LEFT VENTRICLE PLAX 2D LVIDd:         3.80 cm LVIDs:         3.10 cm LV PW:         1.70 cm LV IVS:        1.00 cm LVOT diam:     2.10 cm LV SV:         61 LV SV Index:   29 LVOT Area:     3.46 cm  LV Volumes (MOD) LV vol d, MOD A4C: 80.1 ml LV vol s, MOD A4C: 39.3 ml  LV SV MOD A4C:     80.1 ml RIGHT VENTRICLE RV S prime:     10.10 cm/s TAPSE (M-mode): 1.3 cm LEFT ATRIUM           Index        RIGHT ATRIUM           Index LA diam:      3.50 cm  1.65 cm/m   RA Area:     19.60 cm LA Vol (A2C): 22.2 ml 10.48 ml/m  RA Volume:   49.60 ml  23.42 ml/m LA Vol (A4C): 20.0 ml 9.44 ml/m  AORTIC VALVE LVOT Vmax:   106.10 cm/s LVOT Vmean:  66.300 cm/s LVOT VTI:    0.178 m  AORTA Ao Root diam: 3.20 cm Ao Asc diam:  3.40 cm MITRAL VALVE               TRICUSPID VALVE MV Area (PHT): 4.31 cm    TR Peak grad:   22.3 mmHg MV Decel Time: 176 msec    TR Vmax:        236.00 cm/s MV E velocity: 84.50 cm/s                            SHUNTS                            Systemic VTI:  0.18 m                            Systemic Diam: 2.10 cm Morene Brownie Electronically signed by Morene Brownie Signature Date/Time: 06/10/2024/2:04:39 PM    Final    CT CHEST ABDOMEN PELVIS WO CONTRAST Result Date: 06/10/2024 CLINICAL DATA:  Generalized weakness for 2 weeks.  Sepsis. EXAM: CT CHEST, ABDOMEN AND PELVIS WITHOUT CONTRAST TECHNIQUE: Multidetector CT imaging of the chest, abdomen and pelvis was performed following the standard protocol without IV contrast. RADIATION DOSE REDUCTION: This exam was performed according to the departmental dose-optimization program which includes automated exposure control, adjustment of the mA and/or kV according to patient size and/or use of iterative reconstruction technique. COMPARISON:  May 12, 2024 FINDINGS: CT CHEST FINDINGS Cardiovascular: Aortic atherosclerosis. No evidence of thoracic aortic aneurysm. Mild cardiomegaly. Coronary artery calcifications are noted. Moderate pericardial effusion or hemopericardium is now noted. Mediastinum/Nodes: Stable left thyroid  nodule which has been previously assessed on ultrasound. Esophagus is unremarkable. Calcified mediastinal lymph nodes are noted suggesting prior granulomatous disease. Lungs/Pleura: No pneumothorax or pleural  effusion is noted. Left lingular and left lower lobe airspace opacities are noted with bronchograms concerning for possible pneumonia. Musculoskeletal: No chest wall mass or suspicious bone lesions identified. CT ABDOMEN PELVIS FINDINGS Hepatobiliary: No focal liver abnormality is seen. Status post cholecystectomy. No biliary dilatation. Pancreas: Unremarkable. No pancreatic ductal dilatation or surrounding inflammatory changes. Spleen: Normal in size without focal abnormality. Adrenals/Urinary Tract: Adrenal glands are unremarkable. Kidneys are normal, without renal calculi, focal lesion, or hydronephrosis. Bladder is unremarkable. Stomach/Bowel: Stomach is unremarkable. Status post appendectomy. Sigmoid diverticulosis without inflammation. No evidence of bowel obstruction. Vascular/Lymphatic: Aortic atherosclerosis. Bilateral inguinal adenopathy is noted. Left inguinal lymph node had been recently biopsied and correlation with pathology results is recommended. Reproductive: Status post hysterectomy. No adnexal masses. Other: Moderate size fat containing periumbilical hernia. No ascites. Musculoskeletal: No acute or significant osseous findings. IMPRESSION: 1. Moderate pericardial effusion or hemopericardium is now noted. 2. Left lingular and left lower lobe airspace opacities are noted with bronchograms concerning for pneumonia. 3. Bilateral inguinal adenopathy is noted. Left inguinal lymph node had been recently biopsied and correlation with pathology results is recommended. 4. Sigmoid diverticulosis without inflammation. 5. Moderate size fat containing periumbilical hernia. 6. Aortic atherosclerosis. Aortic Atherosclerosis (ICD10-I70.0). Electronically Signed   By: Lynwood Landy Mickey CHRISTELLA.D.  On: 06/10/2024 11:54   DG Chest Port 1 View Result Date: 06/10/2024 EXAM: 1 VIEW(S) XRAY OF THE CHEST 06/10/2024 07:50:00 AM COMPARISON: 05/12/2024 CLINICAL HISTORY: weakness FINDINGS: LUNGS AND PLEURA: Calcified granuloma of  right costophrenic angle. Small to moderate left sided pleural effusion. Increasing left basilar opacities which may reflect atelectasis versus infection. Prominent central pulmonary vasculature without pulmonary edema. No pneumothorax. HEART AND MEDIASTINUM: Cardiomegaly. Atherosclerotic plaque. BONES AND SOFT TISSUES: No acute osseous abnormality. IMPRESSION: 1. Small to moderate left pleural effusion with increasing left basilar opacities, which may reflect atelectasis or infection. 2. Cardiomegaly. Electronically signed by: Donnice Mania MD 06/10/2024 08:56 AM EST RP Workstation: [id number]      Subjective: Patient seen and examined at bedside.  Continues to feel anxious but feels okay to go to rehab today.  No fever, vomiting, worsening abdominal pain reported.  Still short of breath with minimal exertion.  Discharge Exam: Vitals:   06/20/24 0439 06/20/24 0814  BP: 137/79 (!) 156/74  Pulse: 79 73  Resp: 17 20  Temp: 98.4 F (36.9 C)   SpO2: 94% 93%    General: Pt is alert, awake, not in acute distress.  Slow to respond.  Poor historian.  On supplemental oxygen  by nasal cannula Cardiovascular: rate controlled, S1/S2 + Respiratory: bilateral decreased breath sounds at bases with scattered crackles Abdominal: Soft, morbidly obese, NT, ND, bowel sounds + Extremities: Trace lower extremity edema; no cyanosis; bilateral lower extremities are wrapped    The results of significant diagnostics from this hospitalization (including imaging, microbiology, ancillary and laboratory) are listed below for reference.     Microbiology: Recent Results (from the past 240 hours)  Respiratory (~20 pathogens) panel by PCR     Status: None   Collection Time: 06/10/24  3:27 PM   Specimen: Nasopharyngeal Swab; Respiratory  Result Value Ref Range Status   Adenovirus NOT DETECTED NOT DETECTED Final   Coronavirus 229E NOT DETECTED NOT DETECTED Final    Comment: (NOTE) The Coronavirus on the Respiratory  Panel, DOES NOT test for the novel  Coronavirus (2019 nCoV)    Coronavirus HKU1 NOT DETECTED NOT DETECTED Final   Coronavirus NL63 NOT DETECTED NOT DETECTED Final   Coronavirus OC43 NOT DETECTED NOT DETECTED Final   Metapneumovirus NOT DETECTED NOT DETECTED Final   Rhinovirus / Enterovirus NOT DETECTED NOT DETECTED Final   Influenza A NOT DETECTED NOT DETECTED Final   Influenza B NOT DETECTED NOT DETECTED Final   Parainfluenza Virus 1 NOT DETECTED NOT DETECTED Final   Parainfluenza Virus 2 NOT DETECTED NOT DETECTED Final   Parainfluenza Virus 3 NOT DETECTED NOT DETECTED Final   Parainfluenza Virus 4 NOT DETECTED NOT DETECTED Final   Respiratory Syncytial Virus NOT DETECTED NOT DETECTED Final   Bordetella pertussis NOT DETECTED NOT DETECTED Final   Bordetella Parapertussis NOT DETECTED NOT DETECTED Final   Chlamydophila pneumoniae NOT DETECTED NOT DETECTED Final   Mycoplasma pneumoniae NOT DETECTED NOT DETECTED Final    Comment: Performed at [facility], [address] [place], [place] [address]  Resp panel by RT-PCR (RSV, Flu A&B, Covid) Nasal Mucosa     Status: None   Collection Time: 06/10/24  3:27 PM   Specimen: Nasal Mucosa; Nasal Swab  Result Value Ref Range Status   SARS Coronavirus 2 by RT PCR NEGATIVE NEGATIVE Final    Comment: (NOTE) SARS-CoV-2 target nucleic acids are NOT DETECTED.  The SARS-CoV-2 RNA is generally detectable in upper respiratory specimens during the acute phase of  infection. The lowest concentration of SARS-CoV-2 viral copies this assay can detect is 138 copies/mL. A negative result does not preclude SARS-Cov-2 infection and should not be used as the sole basis for treatment or other patient management decisions. A negative result may occur with  improper specimen collection/handling, submission of specimen other than nasopharyngeal swab, presence of viral mutation(s) within the areas targeted by this assay, and inadequate number of  viral copies(<138 copies/mL). A negative result must be combined with clinical observations, patient history, and epidemiological information. The expected result is Negative.  Fact Sheet for Patients:  bloggercourse.com  Fact Sheet for Healthcare Providers:  seriousbroker.it  This test is no t yet approved or cleared by the [place]  FDA and  has been authorized for detection and/or diagnosis of SARS-CoV-2 by FDA under an Emergency Use Authorization (EUA). This EUA will remain  in effect (meaning this test can be used) for the duration of the COVID-19 declaration under Section 564(b)(1) of the Act, 21 U.S.C.section 360bbb-3(b)(1), unless the authorization is terminated  or revoked sooner.       Influenza A by PCR NEGATIVE NEGATIVE Final   Influenza B by PCR NEGATIVE NEGATIVE Final    Comment: (NOTE) The Xpert Xpress SARS-CoV-2/FLU/RSV plus assay is intended as an aid in the diagnosis of influenza from Nasopharyngeal swab specimens and should not be used as a sole basis for treatment. Nasal washings and aspirates are unacceptable for Xpert Xpress SARS-CoV-2/FLU/RSV testing.  Fact Sheet for Patients: bloggercourse.com  Fact Sheet for Healthcare Providers: seriousbroker.it  This test is not yet approved or cleared by the [place]  FDA and has been authorized for detection and/or diagnosis of SARS-CoV-2 by FDA under an Emergency Use Authorization (EUA). This EUA will remain in effect (meaning this test can be used) for the duration of the COVID-19 declaration under Section 564(b)(1) of the Act, 21 U.S.C. section 360bbb-3(b)(1), unless the authorization is terminated or revoked.     Resp Syncytial Virus by PCR NEGATIVE NEGATIVE Final    Comment: (NOTE) Fact Sheet for Patients: bloggercourse.com  Fact Sheet for Healthcare  Providers: seriousbroker.it  This test is not yet approved or cleared by the [place]  FDA and has been authorized for detection and/or diagnosis of SARS-CoV-2 by FDA under an Emergency Use Authorization (EUA). This EUA will remain in effect (meaning this test can be used) for the duration of the COVID-19 declaration under Section 564(b)(1) of the Act, 21 U.S.C. section 360bbb-3(b)(1), unless the authorization is terminated or revoked.  Performed at [facility], [address] [place], [place] [address]   MRSA Next Gen by PCR, Nasal     Status: None   Collection Time: 06/10/24  3:36 PM   Specimen: Nasal Mucosa; Nasal Swab  Result Value Ref Range Status   MRSA by PCR Next Gen NOT DETECTED NOT DETECTED Final    Comment: (NOTE) The GeneXpert MRSA Assay (FDA approved for NASAL specimens only), is one component of a comprehensive MRSA colonization surveillance program. It is not intended to diagnose MRSA infection nor to guide or monitor treatment for MRSA infections. Test performance is not FDA approved in patients less than 43 years old. Performed at [facility], [address] [place], [place] [address]   Blood culture (routine x 2)     Status: None   Collection Time: 06/10/24  4:41 PM   Specimen: BLOOD LEFT HAND  Result Value Ref Range Status   Specimen Description   Final  BLOOD LEFT HAND BOTTLES DRAWN AEROBIC ONLY Performed at [facility], [address], [place], [place] [address]    Special Requests   Final    Blood Culture results may not be optimal due to an inadequate volume of blood received in culture bottles Performed at [facility], [address] [place], [place] [address]    Culture   Final    NO GROWTH 5 DAYS Performed at [facility], [address], [place], [place] [address]    Report Status 06/15/2024 FINAL  Final  Culture, blood  (Routine X 2) w Reflex to ID Panel     Status: None (Preliminary result)   Collection Time: 06/16/24  2:22 PM   Specimen: BLOOD LEFT HAND  Result Value Ref Range Status   Specimen Description   Final    BLOOD LEFT HAND Performed at [facility], [address] [place], [place] [address]    Special Requests   Final    BOTTLES DRAWN AEROBIC AND ANAEROBIC Blood Culture adequate volume Performed at [facility], [address] [place], [place] [address]    Culture   Final    NO GROWTH 4 DAYS Performed at [facility], [address], [place], [place] [address]    Report Status PENDING  Incomplete  Culture, blood (Routine X 2) w Reflex to ID Panel     Status: None (Preliminary result)   Collection Time: 06/16/24  2:27 PM   Specimen: BLOOD LEFT HAND  Result Value Ref Range Status   Specimen Description   Final    BLOOD LEFT HAND Performed at [facility], [address], [place], [place] [address]    Special Requests   Final    BOTTLES DRAWN AEROBIC AND ANAEROBIC Blood Culture adequate volume Performed at [facility], [address], [place], [place] [address]    Culture   Final    NO GROWTH 4 DAYS Performed at [facility], [address] [place], [place] [address]    Report Status PENDING  Incomplete     Labs: BNP (last 3 results) No results for input(s): BNP in the last 8760 hours. Basic Metabolic Panel: Recent Labs  Lab 06/16/24 0612 06/17/24 0556 06/18/24 0529 06/19/24 0514 06/20/24 0447  NA 130* 127* 126* 126* 128*  K 4.4 4.5 4.4 4.3 4.1  CL 93* 91* 90* 89* 90*  CO2 23 26 28 28 29   GLUCOSE 86 86 103* 106* 87  BUN 27* 30* 29* 27* 25*  CREATININE 1.12* 1.22* 1.30* 1.23* 1.01*  CALCIUM  8.7* 8.8* 8.7* 8.7* 8.5*  MG 2.4 2.5* 2.4 2.1 1.9   Liver Function Tests: Recent Labs  Lab 06/16/24 0612  AST 26  ALT 17  ALKPHOS 140*  BILITOT 0.8  PROT 6.9  ALBUMIN 2.6*   No results for input(s):  LIPASE, AMYLASE in the last 168 hours. No results for input(s): AMMONIA in the last 168 hours. CBC: Recent Labs  Lab 06/14/24 0512 06/16/24 0612 06/18/24 0529 06/19/24 0514  WBC  --  9.1 8.3 7.9  NEUTROABS  --   --  5.7 5.8  HGB 9.4* 10.5* 10.3* 10.8*  HCT 29.2* 32.6* 31.4* 32.6*  MCV  --  91.1 91.0 91.1  PLT  --  182 227 217   Cardiac Enzymes: No results for input(s): CKTOTAL, CKMB, CKMBINDEX, TROPONINI in the last 168 hours. BNP: Invalid input(s): POCBNP CBG: Recent Labs  Lab 06/19/24 0737 06/19/24 1126 06/19/24 1608 06/19/24 2015 06/20/24 0721  GLUCAP 137* 127* 120* 117* 95   D-Dimer No results for input(s): DDIMER in the last 72 hours. Hgb A1c No results for input(s): HGBA1C in the last 72 hours. Lipid Profile No results for input(s): CHOL, HDL, LDLCALC, TRIG, CHOLHDL, LDLDIRECT in the last 72 hours. Thyroid  function studies No results for input(s): TSH, T4TOTAL, T3FREE, THYROIDAB in the last 72 hours.  Invalid input(s): FREET3 Anemia work up No results for input(s): VITAMINB12, FOLATE, FERRITIN, TIBC, IRON, RETICCTPCT in the last 72 hours. Urinalysis    Component Value Date/Time   COLORURINE YELLOW 06/10/2024 1450   APPEARANCEUR HAZY (A) 06/10/2024 1450   LABSPEC 1.026 06/10/2024 1450   PHURINE 5.0 06/10/2024 1450   GLUCOSEU NEGATIVE 06/10/2024 1450   HGBUR NEGATIVE 06/10/2024 1450   BILIRUBINUR NEGATIVE 06/10/2024 1450   KETONESUR NEGATIVE 06/10/2024 1450   PROTEINUR 30 (A) 06/10/2024 1450   UROBILINOGEN 0.2 06/02/2007 1024   NITRITE NEGATIVE 06/10/2024 1450   LEUKOCYTESUR SMALL (A) 06/10/2024 1450   Sepsis Labs Recent Labs  Lab 06/16/24 0612 06/18/24 0529 06/19/24 0514  WBC 9.1 8.3 7.9   Microbiology Recent Results (from the past 240 hours)  Respiratory (~20 pathogens) panel by PCR     Status: None   Collection Time: 06/10/24  3:27 PM   Specimen: Nasopharyngeal Swab; Respiratory  Result  Value Ref Range Status   Adenovirus NOT DETECTED NOT DETECTED Final   Coronavirus 229E NOT DETECTED NOT DETECTED Final    Comment: (NOTE) The Coronavirus on the Respiratory Panel, DOES NOT test for the novel  Coronavirus (2019 nCoV)    Coronavirus HKU1 NOT DETECTED NOT DETECTED Final   Coronavirus NL63 NOT DETECTED NOT DETECTED Final   Coronavirus OC43 NOT DETECTED NOT DETECTED Final   Metapneumovirus NOT DETECTED NOT DETECTED Final   Rhinovirus / Enterovirus NOT DETECTED NOT DETECTED Final   Influenza A NOT DETECTED NOT DETECTED Final   Influenza B NOT DETECTED NOT DETECTED Final   Parainfluenza Virus 1 NOT DETECTED NOT DETECTED Final   Parainfluenza Virus 2 NOT DETECTED NOT DETECTED Final   Parainfluenza Virus 3 NOT DETECTED NOT DETECTED Final   Parainfluenza Virus 4 NOT DETECTED NOT DETECTED Final   Respiratory Syncytial Virus NOT DETECTED NOT DETECTED Final   Bordetella pertussis NOT DETECTED NOT DETECTED Final   Bordetella Parapertussis NOT DETECTED NOT DETECTED Final   Chlamydophila pneumoniae NOT DETECTED NOT DETECTED Final   Mycoplasma pneumoniae NOT DETECTED NOT DETECTED Final    Comment: Performed at [facility], [address] [place], [place] [address]  Resp panel by RT-PCR (RSV, Flu A&B, Covid) Nasal Mucosa     Status: None   Collection Time: 06/10/24  3:27 PM   Specimen: Nasal Mucosa; Nasal Swab  Result Value Ref Range Status   SARS Coronavirus 2 by RT PCR NEGATIVE NEGATIVE Final    Comment: (NOTE) SARS-CoV-2 target nucleic acids are NOT DETECTED.  The SARS-CoV-2 RNA is generally detectable in upper respiratory specimens during the acute phase of infection. The lowest concentration of SARS-CoV-2 viral copies this assay can detect is 138 copies/mL. A negative result does not preclude SARS-Cov-2 infection and should not be used as the sole basis for treatment or other patient management decisions. A negative result may occur with  improper specimen  collection/handling, submission of specimen other than nasopharyngeal swab, presence of viral mutation(s) within the areas targeted by this assay, and inadequate number of viral copies(<138 copies/mL).  A negative result must be combined with clinical observations, patient history, and epidemiological information. The expected result is Negative.  Fact Sheet for Patients:  bloggercourse.com  Fact Sheet for Healthcare Providers:  seriousbroker.it  This test is no t yet approved or cleared by the [place]  FDA and  has been authorized for detection and/or diagnosis of SARS-CoV-2 by FDA under an Emergency Use Authorization (EUA). This EUA will remain  in effect (meaning this test can be used) for the duration of the COVID-19 declaration under Section 564(b)(1) of the Act, 21 U.S.C.section 360bbb-3(b)(1), unless the authorization is terminated  or revoked sooner.       Influenza A by PCR NEGATIVE NEGATIVE Final   Influenza B by PCR NEGATIVE NEGATIVE Final    Comment: (NOTE) The Xpert Xpress SARS-CoV-2/FLU/RSV plus assay is intended as an aid in the diagnosis of influenza from Nasopharyngeal swab specimens and should not be used as a sole basis for treatment. Nasal washings and aspirates are unacceptable for Xpert Xpress SARS-CoV-2/FLU/RSV testing.  Fact Sheet for Patients: bloggercourse.com  Fact Sheet for Healthcare Providers: seriousbroker.it  This test is not yet approved or cleared by the [place]  FDA and has been authorized for detection and/or diagnosis of SARS-CoV-2 by FDA under an Emergency Use Authorization (EUA). This EUA will remain in effect (meaning this test can be used) for the duration of the COVID-19 declaration under Section 564(b)(1) of the Act, 21 U.S.C. section 360bbb-3(b)(1), unless the authorization is terminated or revoked.     Resp Syncytial  Virus by PCR NEGATIVE NEGATIVE Final    Comment: (NOTE) Fact Sheet for Patients: bloggercourse.com  Fact Sheet for Healthcare Providers: seriousbroker.it  This test is not yet approved or cleared by the [place]  FDA and has been authorized for detection and/or diagnosis of SARS-CoV-2 by FDA under an Emergency Use Authorization (EUA). This EUA will remain in effect (meaning this test can be used) for the duration of the COVID-19 declaration under Section 564(b)(1) of the Act, 21 U.S.C. section 360bbb-3(b)(1), unless the authorization is terminated or revoked.  Performed at [facility], [address] [place], [place] [address]   MRSA Next Gen by PCR, Nasal     Status: None   Collection Time: 06/10/24  3:36 PM   Specimen: Nasal Mucosa; Nasal Swab  Result Value Ref Range Status   MRSA by PCR Next Gen NOT DETECTED NOT DETECTED Final    Comment: (NOTE) The GeneXpert MRSA Assay (FDA approved for NASAL specimens only), is one component of a comprehensive MRSA colonization surveillance program. It is not intended to diagnose MRSA infection nor to guide or monitor treatment for MRSA infections. Test performance is not FDA approved in patients less than 42 years old. Performed at [facility], [address], [place], [place] [address]   Blood culture (routine x 2)     Status: None   Collection Time: 06/10/24  4:41 PM   Specimen: BLOOD LEFT HAND  Result Value Ref Range Status   Specimen Description   Final    BLOOD LEFT HAND BOTTLES DRAWN AEROBIC ONLY Performed at [facility], [address], [place], [place] [address]    Special Requests   Final    Blood Culture results may not be optimal due to an inadequate volume of blood received in culture bottles Performed at [facility], [address], [place], [place] [address]    Culture   Final    NO  GROWTH 5 DAYS Performed at [facility], [address], [place], [place] [address]    Report Status 06/15/2024 FINAL  Final  Culture, blood (Routine X 2) w Reflex to ID Panel     Status: None (Preliminary result)   Collection Time: 06/16/24  2:22 PM   Specimen: BLOOD LEFT HAND  Result Value Ref Range Status   Specimen Description   Final    BLOOD LEFT HAND Performed at [facility], [address], [place], [place] [address]    Special Requests   Final    BOTTLES DRAWN AEROBIC AND ANAEROBIC Blood Culture adequate volume Performed at [facility], [address], [place], [place] [address]    Culture   Final    NO GROWTH 4 DAYS Performed at [facility], [address] [place], [place] [address]    Report Status PENDING  Incomplete  Culture, blood (Routine X 2) w Reflex to ID Panel     Status: None (Preliminary result)   Collection Time: 06/16/24  2:27 PM   Specimen: BLOOD LEFT HAND  Result Value Ref Range Status   Specimen Description   Final    BLOOD LEFT HAND Performed at [facility], [address] [place], [place] [address]    Special Requests   Final    BOTTLES DRAWN AEROBIC AND ANAEROBIC Blood Culture adequate volume Performed at [facility], [address] [place], [place] [address]    Culture   Final    NO GROWTH 4 DAYS Performed at [facility], [address] [place], [place] [address]    Report Status PENDING  Incomplete     Time coordinating discharge: 35 minutes  SIGNED:   Sophie Mao, MD  [facility] 06/20/2024, 10:45 AM      [1]  Allergies Allergen Reactions   Atenolol Other (See Comments)    Depression    Citalopram Hydrobromide Nausea Only and Other (See Comments)    Nausea/Fatigue   Clindamycin /Lincomycin Other (See Comments)    Via IV, has caused a metallic taste in the mouth    Codeine Other (See Comments)    Caused Hyperactivity and Dysphoria    Coenzyme Q10 Itching   Erythromycin Itching, Anxiety and Other (See Comments)    Also with jitters   Motrin [Ibuprofen] Other (See Comments)    Is to not take this   Statins Other (See Comments)    Leg muscle weakness w/ rosuvastatin and simvastatin    Latex Rash   Losartan Potassium Other (See Comments)    Muscle aches

## 2024-06-20 NOTE — Progress Notes (Signed)
" °   06/20/24 0023  BiPAP/CPAP/SIPAP  $ Non-Invasive Home Ventilator  Subsequent  BiPAP/CPAP/SIPAP Pt Type Adult  BiPAP/CPAP/SIPAP Resmed  Mask Type Full face mask  Dentures removed? Not applicable  Mask Size Large  Respiratory Rate 24 breaths/min  Flow Rate 5 lpm  Patient Home Machine No  Patient Home Mask No  Patient Home Tubing No  Auto Titrate Yes  Minimum cmH2O 5 cmH2O  Maximum cmH2O 15 cmH2O  BiPAP/CPAP /SiPAP Vitals  Resp (!) 24  MEWS Score/Color  MEWS Score 1  MEWS Score Color Green    "

## 2024-06-20 NOTE — Progress Notes (Signed)
 Report attempted x2 to [facility] with no answer

## 2024-06-21 ENCOUNTER — Encounter (HOSPITAL_COMMUNITY): Payer: Self-pay | Admitting: Internal Medicine

## 2024-06-21 ENCOUNTER — Ambulatory Visit: Admitting: Cardiology

## 2024-06-21 ENCOUNTER — Emergency Department (HOSPITAL_COMMUNITY)

## 2024-06-21 ENCOUNTER — Inpatient Hospital Stay (HOSPITAL_COMMUNITY)

## 2024-06-21 ENCOUNTER — Inpatient Hospital Stay (HOSPITAL_COMMUNITY)
Admission: EM | Admit: 2024-06-21 | Discharge: 2024-07-01 | DRG: 314 | Disposition: A | Discharge status: Skilled Nursing Facility | Attending: Internal Medicine | Admitting: Internal Medicine

## 2024-06-21 ENCOUNTER — Other Ambulatory Visit: Payer: Self-pay

## 2024-06-21 DIAGNOSIS — J690 Pneumonitis due to inhalation of food and vomit: Secondary | ICD-10-CM | POA: Diagnosis present

## 2024-06-21 DIAGNOSIS — I3139 Other pericardial effusion (noninflammatory): Secondary | ICD-10-CM

## 2024-06-21 DIAGNOSIS — M1990 Unspecified osteoarthritis, unspecified site: Secondary | ICD-10-CM | POA: Diagnosis present

## 2024-06-21 DIAGNOSIS — K219 Gastro-esophageal reflux disease without esophagitis: Secondary | ICD-10-CM | POA: Diagnosis present

## 2024-06-21 DIAGNOSIS — G4733 Obstructive sleep apnea (adult) (pediatric): Secondary | ICD-10-CM | POA: Diagnosis present

## 2024-06-21 DIAGNOSIS — Z803 Family history of malignant neoplasm of breast: Secondary | ICD-10-CM

## 2024-06-21 DIAGNOSIS — L8990 Pressure ulcer of unspecified site, unspecified stage: Secondary | ICD-10-CM | POA: Insufficient documentation

## 2024-06-21 DIAGNOSIS — J918 Pleural effusion in other conditions classified elsewhere: Secondary | ICD-10-CM | POA: Diagnosis present

## 2024-06-21 DIAGNOSIS — R0603 Acute respiratory distress: Principal | ICD-10-CM

## 2024-06-21 DIAGNOSIS — I309 Acute pericarditis, unspecified: Secondary | ICD-10-CM | POA: Diagnosis present

## 2024-06-21 DIAGNOSIS — R591 Generalized enlarged lymph nodes: Secondary | ICD-10-CM | POA: Diagnosis not present

## 2024-06-21 DIAGNOSIS — F419 Anxiety disorder, unspecified: Secondary | ICD-10-CM | POA: Diagnosis present

## 2024-06-21 DIAGNOSIS — E66812 Obesity, class 2: Secondary | ICD-10-CM | POA: Diagnosis present

## 2024-06-21 DIAGNOSIS — R32 Unspecified urinary incontinence: Secondary | ICD-10-CM | POA: Diagnosis present

## 2024-06-21 DIAGNOSIS — Z7901 Long term (current) use of anticoagulants: Secondary | ICD-10-CM

## 2024-06-21 DIAGNOSIS — E876 Hypokalemia: Secondary | ICD-10-CM | POA: Diagnosis present

## 2024-06-21 DIAGNOSIS — R0602 Shortness of breath: Secondary | ICD-10-CM | POA: Diagnosis present

## 2024-06-21 DIAGNOSIS — Z86711 Personal history of pulmonary embolism: Secondary | ICD-10-CM

## 2024-06-21 DIAGNOSIS — I5033 Acute on chronic diastolic (congestive) heart failure: Secondary | ICD-10-CM | POA: Diagnosis present

## 2024-06-21 DIAGNOSIS — J189 Pneumonia, unspecified organism: Secondary | ICD-10-CM | POA: Diagnosis present

## 2024-06-21 DIAGNOSIS — D638 Anemia in other chronic diseases classified elsewhere: Secondary | ICD-10-CM | POA: Diagnosis present

## 2024-06-21 DIAGNOSIS — I4821 Permanent atrial fibrillation: Secondary | ICD-10-CM | POA: Diagnosis present

## 2024-06-21 DIAGNOSIS — I311 Chronic constrictive pericarditis: Secondary | ICD-10-CM | POA: Diagnosis present

## 2024-06-21 DIAGNOSIS — E119 Type 2 diabetes mellitus without complications: Secondary | ICD-10-CM | POA: Diagnosis present

## 2024-06-21 DIAGNOSIS — E43 Unspecified severe protein-calorie malnutrition: Secondary | ICD-10-CM | POA: Diagnosis present

## 2024-06-21 DIAGNOSIS — Z8709 Personal history of other diseases of the respiratory system: Secondary | ICD-10-CM | POA: Diagnosis not present

## 2024-06-21 DIAGNOSIS — J90 Pleural effusion, not elsewhere classified: Secondary | ICD-10-CM | POA: Diagnosis not present

## 2024-06-21 DIAGNOSIS — F32A Depression, unspecified: Secondary | ICD-10-CM | POA: Diagnosis present

## 2024-06-21 DIAGNOSIS — E890 Postprocedural hypothyroidism: Secondary | ICD-10-CM | POA: Diagnosis present

## 2024-06-21 DIAGNOSIS — B957 Other staphylococcus as the cause of diseases classified elsewhere: Secondary | ICD-10-CM

## 2024-06-21 DIAGNOSIS — Z8249 Family history of ischemic heart disease and other diseases of the circulatory system: Secondary | ICD-10-CM

## 2024-06-21 DIAGNOSIS — E872 Acidosis, unspecified: Secondary | ICD-10-CM | POA: Diagnosis present

## 2024-06-21 DIAGNOSIS — Y95 Nosocomial condition: Secondary | ICD-10-CM | POA: Diagnosis present

## 2024-06-21 DIAGNOSIS — E785 Hyperlipidemia, unspecified: Secondary | ICD-10-CM | POA: Diagnosis present

## 2024-06-21 DIAGNOSIS — G8929 Other chronic pain: Secondary | ICD-10-CM | POA: Diagnosis present

## 2024-06-21 DIAGNOSIS — Z85850 Personal history of malignant neoplasm of thyroid: Secondary | ICD-10-CM

## 2024-06-21 DIAGNOSIS — E871 Hypo-osmolality and hyponatremia: Secondary | ICD-10-CM | POA: Diagnosis present

## 2024-06-21 DIAGNOSIS — I110 Hypertensive heart disease with heart failure: Principal | ICD-10-CM | POA: Diagnosis present

## 2024-06-21 DIAGNOSIS — Z91040 Latex allergy status: Secondary | ICD-10-CM

## 2024-06-21 DIAGNOSIS — Z1152 Encounter for screening for COVID-19: Secondary | ICD-10-CM | POA: Diagnosis not present

## 2024-06-21 DIAGNOSIS — L89152 Pressure ulcer of sacral region, stage 2: Secondary | ICD-10-CM | POA: Diagnosis present

## 2024-06-21 DIAGNOSIS — Z79890 Hormone replacement therapy (postmenopausal): Secondary | ICD-10-CM

## 2024-06-21 DIAGNOSIS — Z79899 Other long term (current) drug therapy: Secondary | ICD-10-CM

## 2024-06-21 DIAGNOSIS — Z860100 Personal history of colon polyps, unspecified: Secondary | ICD-10-CM

## 2024-06-21 DIAGNOSIS — Z6838 Body mass index (BMI) 38.0-38.9, adult: Secondary | ICD-10-CM

## 2024-06-21 DIAGNOSIS — Z85820 Personal history of malignant melanoma of skin: Secondary | ICD-10-CM

## 2024-06-21 DIAGNOSIS — Z9049 Acquired absence of other specified parts of digestive tract: Secondary | ICD-10-CM

## 2024-06-21 DIAGNOSIS — Z881 Allergy status to other antibiotic agents status: Secondary | ICD-10-CM

## 2024-06-21 DIAGNOSIS — J9621 Acute and chronic respiratory failure with hypoxia: Secondary | ICD-10-CM | POA: Diagnosis present

## 2024-06-21 DIAGNOSIS — I4891 Unspecified atrial fibrillation: Secondary | ICD-10-CM | POA: Diagnosis not present

## 2024-06-21 DIAGNOSIS — I5030 Unspecified diastolic (congestive) heart failure: Secondary | ICD-10-CM

## 2024-06-21 DIAGNOSIS — Z7983 Long term (current) use of bisphosphonates: Secondary | ICD-10-CM

## 2024-06-21 DIAGNOSIS — Z8639 Personal history of other endocrine, nutritional and metabolic disease: Secondary | ICD-10-CM | POA: Diagnosis not present

## 2024-06-21 DIAGNOSIS — R1310 Dysphagia, unspecified: Secondary | ICD-10-CM | POA: Diagnosis present

## 2024-06-21 DIAGNOSIS — Z823 Family history of stroke: Secondary | ICD-10-CM

## 2024-06-21 LAB — CBC WITH DIFFERENTIAL/PLATELET
Abs Immature Granulocytes: 0.06 K/uL (ref 0.00–0.07)
Basophils Absolute: 0 K/uL (ref 0.0–0.1)
Basophils Relative: 0 %
Eosinophils Absolute: 0.1 K/uL (ref 0.0–0.5)
Eosinophils Relative: 1 %
HCT: 33.8 % — ABNORMAL LOW (ref 36.0–46.0)
Hemoglobin: 10.7 g/dL — ABNORMAL LOW (ref 12.0–15.0)
Immature Granulocytes: 1 %
Lymphocytes Relative: 6 %
Lymphs Abs: 0.7 K/uL (ref 0.7–4.0)
MCH: 29.5 pg (ref 26.0–34.0)
MCHC: 31.7 g/dL (ref 30.0–36.0)
MCV: 93.1 fL (ref 80.0–100.0)
Monocytes Absolute: 0.9 K/uL (ref 0.1–1.0)
Monocytes Relative: 9 %
Neutro Abs: 9.1 K/uL — ABNORMAL HIGH (ref 1.7–7.7)
Neutrophils Relative %: 83 %
Platelets: 175 K/uL (ref 150–400)
RBC: 3.63 MIL/uL — ABNORMAL LOW (ref 3.87–5.11)
RDW: 16.4 % — ABNORMAL HIGH (ref 11.5–15.5)
WBC: 10.9 K/uL — ABNORMAL HIGH (ref 4.0–10.5)
nRBC: 0.3 % — ABNORMAL HIGH (ref 0.0–0.2)

## 2024-06-21 LAB — COMPREHENSIVE METABOLIC PANEL WITH GFR
ALT: 16 U/L (ref 0–44)
AST: 26 U/L (ref 15–41)
Albumin: 2.8 g/dL — ABNORMAL LOW (ref 3.5–5.0)
Alkaline Phosphatase: 154 U/L — ABNORMAL HIGH (ref 38–126)
Anion gap: 9 (ref 5–15)
BUN: 22 mg/dL (ref 8–23)
CO2: 32 mmol/L (ref 22–32)
Calcium: 8.3 mg/dL — ABNORMAL LOW (ref 8.9–10.3)
Chloride: 93 mmol/L — ABNORMAL LOW (ref 98–111)
Creatinine, Ser: 0.94 mg/dL (ref 0.44–1.00)
GFR, Estimated: 59 mL/min — ABNORMAL LOW
Glucose, Bld: 134 mg/dL — ABNORMAL HIGH (ref 70–99)
Potassium: 4.2 mmol/L (ref 3.5–5.1)
Sodium: 133 mmol/L — ABNORMAL LOW (ref 135–145)
Total Bilirubin: 0.6 mg/dL (ref 0.0–1.2)
Total Protein: 7 g/dL (ref 6.5–8.1)

## 2024-06-21 LAB — CULTURE, BLOOD (ROUTINE X 2)
Culture: NO GROWTH
Culture: NO GROWTH
Special Requests: ADEQUATE
Special Requests: ADEQUATE

## 2024-06-21 LAB — RESP PANEL BY RT-PCR (RSV, FLU A&B, COVID)  RVPGX2
Influenza A by PCR: NEGATIVE
Influenza B by PCR: NEGATIVE
Resp Syncytial Virus by PCR: NEGATIVE
SARS Coronavirus 2 by RT PCR: NEGATIVE

## 2024-06-21 LAB — ECHOCARDIOGRAM LIMITED
Height: 64 in
S' Lateral: 3.3 cm
Single Plane A4C EF: 63.3 %

## 2024-06-21 LAB — I-STAT CG4 LACTIC ACID, ED
Lactic Acid, Venous: 2.7 mmol/L (ref 0.5–1.9)
Lactic Acid, Venous: 1.8 mmol/L (ref 0.5–1.9)

## 2024-06-21 LAB — PROTIME-INR
INR: 2.6 — ABNORMAL HIGH (ref 0.8–1.2)
Prothrombin Time: 28.7 s — ABNORMAL HIGH (ref 11.4–15.2)

## 2024-06-21 LAB — CBG MONITORING, ED
Glucose-Capillary: 120 mg/dL — ABNORMAL HIGH (ref 70–99)
Glucose-Capillary: 116 mg/dL — ABNORMAL HIGH (ref 70–99)
Glucose-Capillary: 111 mg/dL — ABNORMAL HIGH (ref 70–99)

## 2024-06-21 LAB — PRO BRAIN NATRIURETIC PEPTIDE: Pro Brain Natriuretic Peptide: 4067 pg/mL — ABNORMAL HIGH

## 2024-06-21 MED ORDER — VANCOMYCIN HCL IN DEXTROSE 1-5 GM/200ML-% IV SOLN: 1000.0000 mg | Freq: Once | INTRAVENOUS | Status: DC

## 2024-06-21 MED ORDER — VANCOMYCIN HCL IN DEXTROSE 1-5 GM/200ML-% IV SOLN: 1000.0000 mg | INTRAVENOUS | Status: DC

## 2024-06-21 MED ORDER — DILTIAZEM HCL 60 MG PO TABS
120.0000 mg | ORAL_TABLET | Freq: Three times a day (TID) | ORAL | Status: DC
  Administered 2024-06-21 – 2024-06-26 (×15): 120 mg via ORAL
  Filled 2024-06-21 (×18): qty 2

## 2024-06-21 MED ORDER — CARVEDILOL 3.125 MG PO TABS
3.1250 mg | ORAL_TABLET | Freq: Two times a day (BID) | ORAL | Status: DC
  Administered 2024-06-21 – 2024-06-25 (×8): 3.125 mg via ORAL
  Filled 2024-06-21 (×8): qty 1

## 2024-06-21 MED ORDER — ACETAMINOPHEN 650 MG RE SUPP: 650.0000 mg | Freq: Four times a day (QID) | RECTAL | Status: DC | PRN

## 2024-06-21 MED ORDER — POLYETHYLENE GLYCOL 3350 17 G PO PACK
17.0000 g | PACK | Freq: Every day | ORAL | Status: DC | PRN
  Administered 2024-06-25 – 2024-06-28 (×3): 17 g via ORAL
  Filled 2024-06-21 (×4): qty 1

## 2024-06-21 MED ORDER — SODIUM CHLORIDE 0.9 % IV SOLN
2.0000 g | Freq: Once | INTRAVENOUS | Status: AC
  Administered 2024-06-21: 2 g via INTRAVENOUS
  Filled 2024-06-21: qty 12.5

## 2024-06-21 MED ORDER — WARFARIN SODIUM 5 MG PO TABS
5.0000 mg | ORAL_TABLET | Freq: Once | ORAL | Status: AC
  Administered 2024-06-21: 5 mg via ORAL
  Filled 2024-06-21: qty 1

## 2024-06-21 MED ORDER — FUROSEMIDE 10 MG/ML IJ SOLN
40.0000 mg | Freq: Two times a day (BID) | INTRAMUSCULAR | Status: DC
  Administered 2024-06-22 – 2024-06-30 (×18): 40 mg via INTRAVENOUS
  Filled 2024-06-21 (×18): qty 4

## 2024-06-21 MED ORDER — SODIUM CHLORIDE 0.9 % IV SOLN
2.0000 g | Freq: Three times a day (TID) | INTRAVENOUS | Status: DC
  Administered 2024-06-21 – 2024-06-22 (×2): 2 g via INTRAVENOUS
  Filled 2024-06-21 (×2): qty 12.5

## 2024-06-21 MED ORDER — FUROSEMIDE 10 MG/ML IJ SOLN
60.0000 mg | Freq: Once | INTRAMUSCULAR | Status: AC
  Administered 2024-06-21: 60 mg via INTRAVENOUS
  Filled 2024-06-21: qty 6

## 2024-06-21 MED ORDER — LEVOTHYROXINE SODIUM 112 MCG PO TABS
112.0000 ug | ORAL_TABLET | Freq: Every morning | ORAL | Status: DC
  Administered 2024-06-22 – 2024-07-01 (×10): 112 ug via ORAL
  Filled 2024-06-21 (×10): qty 1

## 2024-06-21 MED ORDER — ACETAMINOPHEN 325 MG PO TABS
650.0000 mg | ORAL_TABLET | Freq: Four times a day (QID) | ORAL | Status: DC | PRN
  Administered 2024-06-28 – 2024-06-30 (×3): 650 mg via ORAL
  Filled 2024-06-21 (×4): qty 2

## 2024-06-21 MED ORDER — SODIUM CHLORIDE 0.9% FLUSH
3.0000 mL | Freq: Two times a day (BID) | INTRAVENOUS | Status: DC
  Administered 2024-06-21 – 2024-07-01 (×21): 3 mL via INTRAVENOUS

## 2024-06-21 MED ORDER — ONDANSETRON HCL 4 MG/2ML IJ SOLN
4.0000 mg | Freq: Four times a day (QID) | INTRAMUSCULAR | Status: DC | PRN
  Administered 2024-06-27 – 2024-06-30 (×2): 4 mg via INTRAVENOUS
  Filled 2024-06-21 (×2): qty 2

## 2024-06-21 MED ORDER — ONDANSETRON HCL 4 MG PO TABS: 4.0000 mg | ORAL_TABLET | Freq: Four times a day (QID) | ORAL | Status: DC | PRN

## 2024-06-21 MED ORDER — GUAIFENESIN ER 600 MG PO TB12
600.0000 mg | ORAL_TABLET | Freq: Two times a day (BID) | ORAL | Status: DC | PRN
  Administered 2024-06-21: 600 mg via ORAL
  Filled 2024-06-21: qty 1

## 2024-06-21 MED ORDER — VANCOMYCIN HCL 2000 MG/400ML IV SOLN: 2000.0000 mg | Freq: Once | INTRAVENOUS | Status: DC

## 2024-06-21 MED ORDER — WARFARIN - PHARMACIST DOSING INPATIENT: Freq: Every day | Status: DC

## 2024-06-21 MED ORDER — INSULIN ASPART 100 UNIT/ML IJ SOLN
0.0000 [IU] | INTRAMUSCULAR | Status: DC
  Administered 2024-06-23 – 2024-06-24 (×3): 1 [IU] via SUBCUTANEOUS
  Administered 2024-06-25: 2 [IU] via SUBCUTANEOUS
  Filled 2024-06-21 (×4): qty 1
  Filled 2024-06-21: qty 2

## 2024-06-21 MED ORDER — VANCOMYCIN HCL 1750 MG/350ML IV SOLN
1750.0000 mg | Freq: Once | INTRAVENOUS | Status: AC
  Administered 2024-06-21: 1750 mg via INTRAVENOUS
  Filled 2024-06-21: qty 350

## 2024-06-21 NOTE — Progress Notes (Addendum)
 PHARMACY - ANTICOAGULATION CONSULT NOTE  Pharmacy Consult for warfarin Indication: atrial fibrillation  Allergies[1]  Patient Measurements: Height: 5' 4 (162.6 cm) IBW/kg (Calculated) : 54.7  Vital Signs: Temp: 98 F (36.7 C) (01/13 1228) Temp Source: Axillary (01/13 1228) BP: 136/66 (01/13 1228) Pulse Rate: 75 (01/13 1228)  Labs: Recent Labs    06/19/24 0514 06/20/24 0447 06/21/24 1237  HGB 10.8*  --  10.7*  HCT 32.6*  --  33.8*  PLT 217  --  175  LABPROT 22.8* 25.4* 28.7*  INR 1.9* 2.2* 2.6*  CREATININE 1.23* 1.01* 0.94    Estimated Creatinine Clearance: 52.7 mL/min (by C-G formula based on SCr of 0.94 mg/dL).   Medical History: Past Medical History:  Diagnosis Date   Anxiety    takes Lorazepam  daily as needed   Arthritis    Cancer (HCC)    thyroid    Cataracts, bilateral    immature   Chronic back pain    compressed vertebrae,takes Fosamax weekly   Family history of adverse reaction to anesthesia    granddaughter gets very sick and mean   History of blood transfusion    no abnormal reaction noted   History of bronchitis 05/2015   History of colon polyps    benign   Hyperlipidemia    takes Zetia  daily   Hypertension    takes Diltiazem  and Avapro  daily   Hypothyroidism    takes Synthroid  daily   Ischemic colitis    hx of-many yrs ago   Joint pain    Joint swelling    OSA on CPAP    05-08-12 AHi was 46, RDI  53. titrated to   9 cm water   with 3 cm flex.-nadir 75%   PE (pulmonary embolism)    takes Coumadin  daily   Peripheral edema    Pneumonia 1992   hx of   Sarcoidosis    Shortness of breath    Urinary frequency    Weakness    left leg.Numbness and tingling.Has sciatica   Wheeze    occaionally. Not new    Medications:  (Not in a hospital admission)   Assessment: 84 yo F presenting with SOB on warfarin PTA for afib hx of PE (regimen: 5mg  PO daily). LD per med rec was 1/12 evening. Pharmacy consulted to dose warfarin for afib.   INR  today is 2.6. Will continue home regimen.  Goal of Therapy:  INR 2-3 Monitor platelets by anticoagulation protocol: Yes   Plan:  Warfarin 5 mg PO x1 at 1600 Daily PT/INR and CBC Assess for signs/sx bleeding  Elma Fail, PharmD PGY1 Clinical Pharmacist [facility]  06/21/2024 3:55 PM       [1]  Allergies Allergen Reactions   Atenolol Other (See Comments)    Depression    Citalopram Hydrobromide Nausea Only and Other (See Comments)    Nausea/Fatigue   Clindamycin /Lincomycin Other (See Comments)    Via IV, has caused a metallic taste in the mouth    Codeine Other (See Comments)    Caused Hyperactivity and Dysphoria   Coenzyme Q10 Itching   Erythromycin Itching, Anxiety and Other (See Comments)    Also with jitters   Motrin [Ibuprofen] Other (See Comments)    Is to not take this   Statins Other (See Comments)    Leg muscle weakness w/ rosuvastatin and simvastatin    Latex Rash   Losartan Potassium Other (See Comments)    Muscle aches

## 2024-06-21 NOTE — Consult Note (Signed)
 "   Cardiology Consultation   Patient ID: [name] MRN: [id number]; DOB: 1940/06/20  Admit date: 06/21/2024 Date of Consult: 06/21/2024  PCP:  [name], MD   [facility] Providers Cardiologist:  Georganna Archer, MD        History of Present Illness:   [name] is a 84 year old female with hypertension, hyperlipidemia, diabetes mellitus, sarcoidosis, OSA, DVT/PE, longstanding persistent A-fib (on warfarin), recent diagnosis of moderate to large pericardial effusion in the setting of uremia with subsequent resolution of uremia and improvement in pericardial effusion, discharged to SNF on 06/20/2024, readmitted with worsening shortness of breath and oxygen  requirement.  Patient had hospitalization in 05/2024 with A-fib with RVR, possible bilateral leg cellulitis, and again in 06/2024, now with sepsis, Staph epidermidis bacteremia, pericardial effusion in the setting of uremia with BUN in 80s that subsequently normalized.  Follow-up echocardiogram on 06/14/2024 showed pericardial effusion was moderate.  She was discharged to SNF on 06/20/2024.  However, today, she had acute worsening of her shortness of breath with increasing requirement of supplemental nasal cannula oxygen  up to 8 L and was therefore sent back to emergency room.  On my interview, patient's breathing is improved, still requires 4-5 L of oxygen .  Patient's sister is at bedside.  Patient's husband is currently not at bedside.  Patient denies any chest pain, fever, chills, nausea, vomiting.  She has not done much physical activity since she went back to the nursing home just yesterday.  Repeat echocardiogram was obtained today,see details below.   Past Medical History:  Diagnosis Date   Anxiety    takes Lorazepam  daily as needed   Arthritis    Cancer (HCC)    thyroid    Cataracts, bilateral    immature   Chronic back pain    compressed vertebrae,takes Fosamax weekly   Family history of  adverse reaction to anesthesia    granddaughter gets very sick and mean   History of blood transfusion    no abnormal reaction noted   History of bronchitis 05/2015   History of colon polyps    benign   Hyperlipidemia    takes Zetia  daily   Hypertension    takes Diltiazem  and Avapro  daily   Hypothyroidism    takes Synthroid  daily   Ischemic colitis    hx of-many yrs ago   Joint pain    Joint swelling    OSA on CPAP    05-08-12 AHi was 46, RDI  53. titrated to   9 cm water   with 3 cm flex.-nadir 75%   PE (pulmonary embolism)    takes Coumadin  daily   Peripheral edema    Pneumonia 1992   hx of   Sarcoidosis    Shortness of breath    Urinary frequency    Weakness    left leg.Numbness and tingling.Has sciatica   Wheeze    occaionally. Not new    Past Surgical History:  Procedure Laterality Date   ABDOMINAL HYSTERECTOMY     APPENDECTOMY     BREAST BIOPSY     biopsies on right x 2   BREAST CYST ASPIRATION Left    CHOLECYSTECTOMY N/A 11/29/2015   Procedure: LAPAROSCOPIC CHOLECYSTECTOMY;  Surgeon: Jina Nephew, MD;  Location: [facility] OR;  Service: General;  Laterality: N/A;   COLONOSCOPY     DILATION AND CURETTAGE OF UTERUS     LIVER BIOPSY     MELANOMA EXCISION     removed from left leg   scalene  surgery  1972   trying to find out what was wrong with her lungs   THYROID  SURGERY       Home Medications:  Prior to Admission medications  Medication Sig Start Date End Date Taking? Authorizing Provider  acetaminophen  (TYLENOL ) 325 MG tablet Take 325-650 mg by mouth 2 (two) times daily as needed for mild pain or headache.   Yes [provider]  bisacodyl (DULCOLAX) 10 MG suppository Place 10 mg rectally daily as needed for moderate constipation.   Yes [provider]  Carboxymethylcellulose Sodium (REFRESH LIQUIGEL OP) Place 1 drop into both eyes 2 (two) times daily as needed (Dry eye).   Yes [provider]  carvedilol  (COREG ) 3.125 MG tablet Take 1  tablet (3.125 mg total) by mouth 2 (two) times daily with a meal. 06/20/24  Yes Cheryle Page, MD  cyanocobalamin (VITAMIN B12) 1000 MCG tablet Take 1,000 mcg by mouth daily.   Yes [provider]  diltiazem  (CARDIZEM ) 120 MG tablet Take 1 tablet (120 mg total) by mouth every 8 (eight) hours. 06/20/24  Yes Cheryle Page, MD  furosemide  (LASIX ) 40 MG tablet Take 40 mg by mouth daily.   Yes [provider]  levothyroxine  (SYNTHROID ) 112 MCG tablet Take 112 mcg by mouth every morning.   Yes [provider]  LORazepam  (ATIVAN ) 1 MG tablet Take 1.5 tablets (1.5 mg total) by mouth See admin instructions. Take 1.5 mg by mouth at bedtime and an additional 1-1.5mg  once a day as needed for anxiety Patient taking differently: Take 1-1.5 mg by mouth See admin instructions. Take 1.5 mg by mouth at bedtime.  PRN order: take 1 mg by mouth every 24 hours as needed for anxiety. 06/20/24  Yes Cheryle Page, MD  magnesium  hydroxide (MILK OF MAGNESIA) 400 MG/5ML suspension Take 30 mLs by mouth daily as needed for mild constipation or moderate constipation.   Yes [provider]  melatonin 5 MG TABS Take 1 tablet (5 mg total) by mouth at bedtime as needed (if lorazepam  doesn't work). Patient taking differently: Take 5 mg by mouth at bedtime as needed (insomnia). 06/20/24  Yes Cheryle Page, MD  polyethylene glycol (MIRALAX  / GLYCOLAX ) 17 g packet Take 17 g by mouth daily as needed for mild constipation. 06/20/24  Yes Cheryle Page, MD  senna-docusate (SENOKOT-S) 8.6-50 MG tablet Take 1-2 tablets by mouth 2 (two) times daily between meals as needed for mild constipation or moderate constipation. Patient taking differently: Take 2 tablets by mouth 2 (two) times daily as needed for mild constipation or moderate constipation. 05/16/24  Yes Gonfa, Taye T, MD  SIMPLY SALINE NA Place 1 spray into both nostrils every 12 (twelve) hours as needed (for dryness).   Yes [provider]   sodium chloride  1 g tablet Take 1 tablet (1 g total) by mouth 3 (three) times daily with meals. 06/20/24  Yes Cheryle Page, MD  Sodium Phosphates (ENEMA RE) Place 1 Dose rectally daily as needed (constipation).   Yes [provider]  warfarin (COUMADIN ) 5 MG tablet Take 5 mg by mouth at bedtime. 04/25/15  Yes [provider]  diltiazem  (CARDIZEM  CD) 360 MG 24 hr capsule Take 360 mg by mouth at bedtime. Patient not taking: Reported on 06/21/2024    [provider]  irbesartan  (AVAPRO ) 75 MG tablet Take 75 mg by mouth daily. Patient not taking: Reported on 06/21/2024    [provider]  nebivolol  (BYSTOLIC ) 5 MG tablet Take 5 mg by mouth daily.  Patient not taking: Reported on 06/21/2024    [provider]  nystatin  cream (MYCOSTATIN ) Apply 1 Application topically 2 (two) times daily as needed for dry skin. Patient not taking: Reported on 06/21/2024 06/07/24   [provider]  triamcinolone cream (KENALOG) 0.1 % Apply 1 Application topically 2 (two) times daily as needed (Irritation). Patient not taking: Reported on 06/21/2024 04/05/21   [provider]    Inpatient Medications: Scheduled Meds:  carvedilol   3.125 mg Oral BID WC   diltiazem   120 mg Oral Q8H   [START ON 06/22/2024] furosemide   40 mg Intravenous BID   insulin  aspart  0-9 Units Subcutaneous Q4H   [START ON 06/22/2024] levothyroxine   112 mcg Oral q morning   sodium chloride  flush  3 mL Intravenous Q12H   Warfarin - Pharmacist Dosing Inpatient   Does not apply q1600   Continuous Infusions:  ceFEPime  (MAXIPIME ) IV     [START ON 06/22/2024] vancomycin      PRN Meds: acetaminophen  **OR** acetaminophen , guaiFENesin , ondansetron  **OR** ondansetron  (ZOFRAN ) IV, polyethylene glycol  Allergies:   Allergies[1]  Social History:   Social History   Socioeconomic History   Marital status: Married    Spouse name: Not on file   Number of children: Not on file   Years of  education: Not on file   Highest education level: Not on file  Occupational History   Occupation: Retired    Associate Professor: RETIRED  Tobacco Use   Smoking status: Never   Smokeless tobacco: Never  Vaping Use   Vaping status: Never Used  Substance and Sexual Activity   Alcohol  use: No   Drug use: No   Sexual activity: Not on file  Other Topics Concern   Not on file  Social History Narrative   Caffeine: 8 oz soda per day   Social Drivers of Health   Tobacco Use: Low Risk (06/10/2024)   Patient History    Smoking Tobacco Use: Never    Smokeless Tobacco Use: Never    Passive Exposure: Not on file  Financial Resource Strain: Not on file  Food Insecurity: No Food Insecurity (06/10/2024)   Epic    Worried About Programme Researcher, Broadcasting/film/video in the Last Year: Never true    Ran Out of Food in the Last Year: Never true  Transportation Needs: No Transportation Needs (06/10/2024)   Epic    Lack of Transportation (Medical): No    Lack of Transportation (Non-Medical): No  Physical Activity: Not on file  Stress: Not on file  Social Connections: Moderately Integrated (06/10/2024)   Social Connection and Isolation Panel    Frequency of Communication with Friends and Family: More than three times a week    Frequency of Social Gatherings with Friends and Family: More than three times a week    Attends Religious Services: 1 to 4 times per year    Active Member of [employer] or Organizations: No    Attends Banker Meetings: Never    Marital Status: Married  Catering Manager Violence: Not At Risk (06/10/2024)   Epic    Fear of Current or Ex-Partner: No    Emotionally Abused: No    Physically Abused: No    Sexually Abused: No  Depression (PHQ2-9): Low Risk (05/27/2024)   Depression (PHQ2-9)    PHQ-2 Score: 0  Alcohol  Screen: Not on file  Housing: Low Risk (06/10/2024)   Epic    Unable to Pay for Housing in the Last Year: No  Number of Times Moved in the Last Year: 0    Homeless in the Last Year:  No  Utilities: Not At Risk (06/10/2024)   Epic    Threatened with loss of utilities: No  Health Literacy: Not on file    Family History:    Family History  Problem Relation Age of Onset   Breast cancer Mother    CVA Mother    Breast cancer Sister    Breast cancer Sister    Heart attack Neg Hx    Sleep apnea Neg Hx      ROS:  Please see the history of present illness.  All other ROS reviewed and negative.     Physical Exam/Data:   Vitals:   06/21/24 1227 06/21/24 1228 06/21/24 1633  BP:  136/66 133/75  Pulse:  75 88  Resp:  20 20  Temp:  98 F (36.7 C) 98 F (36.7 C)  TempSrc:  Axillary Oral  SpO2:  94% 95%  Height: 5' 4 (1.626 m)      Intake/Output Summary (Last 24 hours) at 06/21/2024 1740 Last data filed at 06/21/2024 1428 Gross per 24 hour  Intake 100 ml  Output --  Net 100 ml      06/20/2024    4:45 AM 06/19/2024    5:00 AM 06/18/2024    5:00 AM  Last 3 Weights  Weight (lbs) 239 lb 10.2 oz 240 lb 8.4 oz 238 lb 15.7 oz  Weight (kg) 108.7 kg 109.1 kg 108.4 kg     Body mass index is 41.13 kg/m.  General:  Well nourished, well developed, in no acute distress HEENT: normal Neck: + JVD Vascular: No carotid bruits; Distal pulses 2+ bilaterally Cardiac:  Irregular S1, S2; RRR; no murmur  Lungs:  B/l rales and mild wheezing Abd: soft, nontender, no hepatomegaly  Ext: B/l trace edema Musculoskeletal:  No deformities, BUE and BLE strength normal and equal Skin: warm and dry  Neuro:  CNs 2-12 intact, no focal abnormalities noted Psych:  Normal affect   EKG:  The EKG was personally reviewed and demonstrates:   EKG 05/21/2025: Atrial fibrillation 84 bpm Low voltage Nonspecific T wave abnormality Prolonged QT interval  Telemetry:  Telemetry was personally reviewed and demonstrates:   Atrial fibrillation with control ventricular rate  Relevant CV Studies: Echocardiogram 06/21/2024:  1. Moderate pericardial effusion which is more prominent up to 2.0 cm  posterior to the LV (best seen subcostal; image 49). The RV is poorly visualized, but does not appear to collapse in diastole. The IVC is dilated. There is evidence of respiratory variation in septal movement on image 41. Findings are suggestive of a clinically significant pericardial effusion. Clinical correlation is recommended. Appearance is stable compared to prior study. Moderate pericardial effusion. The pericardial effusion  is circumferential.   2. Right ventricular systolic function was not well visualized. The right  ventricular size is not well visualized. Tricuspid regurgitation signal is  inadequate for assessing PA pressure.   3. The mitral valve is grossly normal. Trivial mitral valve  regurgitation. No evidence of mitral stenosis.   4. The aortic valve is tricuspid. Aortic valve regurgitation is not  visualized. Aortic valve sclerosis is present, with no evidence of aortic  valve stenosis.   5. The inferior vena cava is dilated in size with <50% respiratory  variability, suggesting right atrial pressure of 15 mmHg.   Comparison(s): No significant change from prior study.    Laboratory Data:  High Sensitivity Troponin:  No results for input(s): TROPONINIHS in the last 720 hours.   Chemistry Recent Labs  Lab 06/18/24 0529 06/19/24 0514 06/20/24 0447 06/21/24 1237  NA 126* 126* 128* 133*  K 4.4 4.3 4.1 4.2  CL 90* 89* 90* 93*  CO2 28 28 29  32  GLUCOSE 103* 106* 87 134*  BUN 29* 27* 25* 22  CREATININE 1.30* 1.23* 1.01* 0.94  CALCIUM  8.7* 8.7* 8.5* 8.3*  MG 2.4 2.1 1.9  --   GFRNONAA 40* 43* 54* 59*  ANIONGAP 9 9 9 9     Recent Labs  Lab 06/16/24 0612 06/21/24 1237  PROT 6.9 7.0  ALBUMIN 2.6* 2.8*  AST 26 26  ALT 17 16  ALKPHOS 140* 154*  BILITOT 0.8 0.6   Lipids No results for input(s): CHOL, TRIG, HDL, LABVLDL, LDLCALC, CHOLHDL in the last 168 hours.  Hematology Recent Labs  Lab 06/18/24 0529 06/19/24 0514 06/21/24 1237  WBC 8.3 7.9  10.9*  RBC 3.45* 3.58* 3.63*  HGB 10.3* 10.8* 10.7*  HCT 31.4* 32.6* 33.8*  MCV 91.0 91.1 93.1  MCH 29.9 30.2 29.5  MCHC 32.8 33.1 31.7  RDW 16.1* 15.9* 16.4*  PLT 227 217 175   Thyroid  No results for input(s): TSH, FREET4 in the last 168 hours.  BNP Recent Labs  Lab 06/21/24 1237  PROBNP 4,067.0*    DDimer No results for input(s): DDIMER in the last 168 hours.   Radiology/Studies:  DG Chest Port 1 View Result Date: 06/21/2024 CLINICAL DATA:  Shortness of breath. EXAM: PORTABLE CHEST 1 VIEW COMPARISON:  06/10/2024, CT 06/10/2024 FINDINGS: Lungs are adequately inflated demonstrate moderate opacification over the lower half of the left hemithorax likely large effusion with associated basilar compressive atelectasis. Mild hazy density over the right base possibly small layering effusion with atelectasis. Mild hazy opacification of the perihilar markings suggesting mild degree of vascular congestion. Multiple calcified hilar/mediastinal lymph nodes. Stable cardiomegaly. Remainder of the exam is unchanged. IMPRESSION: 1. Moderate opacification over the lower half of the left hemithorax likely large effusion with associated basilar compressive atelectasis. Possible small layering right effusion with atelectasis. 2. Stable cardiomegaly with suggestion of mild vascular congestion. Electronically Signed   By: Toribio Agreste M.D.   On: 06/21/2024 13:09     Assessment and Plan:   84 year old female with hypertension, hyperlipidemia, diabetes mellitus, sarcoidosis, OSA, DVT/PE, longstanding persistent A-fib (on warfarin), recent diagnosis of moderate to large pericardial effusion in the setting of uremia with subsequent resolution of uremia and improvement in pericardial effusion, discharged to SNF on 06/20/2024, readmitted with worsening shortness of breath and oxygen  requirement.  Acute on chronic hypoxic respiratory failure: In the setting of known moderate pericardial effusion, also with  presentation consistent with acute on chronic HFpEF. While pericardial fusion is not large in size, I am concerned with possible constrictive effusive pericarditis physiology with dilated IVC, elevated JVD, bilateral rales and patient's clinical presentation. Recommend IV Lasix  40 mg twice daily for now. After improvement in respiratory status, I would recommend obtaining either MRI or simultaneous left and right heart catheterization. That said, I remain unsure if she would be a good surgical candidate for pericardiectomy given her age and overall deconditioning.  Pulmonary atrial fibrillation: Rate controlled.  On warfarin, INR 2.6 today.  Will continue to follow the patient with you.  Risk Assessment/Risk Scores:      New York  Heart Association (NYHA) Functional Class NYHA Class III  CHA2DS2-VASc Score = 5  This indicates a 7.2% annual risk of stroke.  The patient's score is based upon: CHF History: 0 HTN History: 1 Diabetes History: 0 Stroke History: 0 Vascular Disease History: 1 Age Score: 2 Gender Score: 1       For questions or updates, please contact [facility] Please consult [email or web address] for contact info under    Signed, Newman JINNY Lawrence, MD  06/21/2024 5:40 PM     [1]  Allergies Allergen Reactions   Atenolol Other (See Comments)    Depression    Citalopram Hydrobromide Nausea Only and Other (See Comments)    Nausea/Fatigue   Clindamycin /Lincomycin Other (See Comments)    Via IV, has caused a metallic taste in the mouth    Codeine Other (See Comments)    Caused Hyperactivity and Dysphoria   Coenzyme Q10 Itching   Erythromycin Itching, Anxiety and Other (See Comments)    Also with jitters   Motrin [Ibuprofen] Other (See Comments)    Is to not take this   Statins Other (See Comments)    Leg muscle weakness w/ rosuvastatin and simvastatin    Latex Rash   Losartan Potassium Other (See Comments)    Muscle aches    "

## 2024-06-21 NOTE — Hospital Course (Signed)
 Basic orders in.

## 2024-06-21 NOTE — ED Provider Notes (Signed)
 " [place] [facility] AT [facility] Provider Note   CSN: [id number] Arrival date & time: 06/21/24  1219     Patient presents with: Shortness of Breath   [name] is a 84 y.o. female.   HPI Elderly female arrives via EMS 1 day after discharge from this facility.  At rehab today patient was found to be hypoxic, did not improve markedly with nonrebreather mask, SNU for evaluation. She is unsure of additional edema, weight gain, she denies fever, chest pain.  MS reports patient was awake and alert in transport.    Prior to Admission medications  Medication Sig Start Date End Date Taking? Authorizing Provider  acetaminophen  (TYLENOL ) 325 MG tablet Take 325-650 mg by mouth 2 (two) times daily as needed for mild pain or headache.    [provider]  Carboxymethylcellulose Sodium (REFRESH LIQUIGEL OP) Place 1 drop into both eyes as needed (Dry eye).    [provider]  carvedilol  (COREG ) 3.125 MG tablet Take 1 tablet (3.125 mg total) by mouth 2 (two) times daily with a meal. 06/20/24   Cheryle Page, MD  Cyanocobalamin (B-12 PO) Take 1 tablet by mouth daily.    [provider]  diltiazem  (CARDIZEM ) 120 MG tablet Take 1 tablet (120 mg total) by mouth every 8 (eight) hours. 06/20/24   Cheryle Page, MD  furosemide  (LASIX ) 40 MG tablet Take 40 mg by mouth daily.    [provider]  levothyroxine  (SYNTHROID ) 112 MCG tablet Take 112 mcg by mouth every morning.    [provider]  LORazepam  (ATIVAN ) 1 MG tablet Take 1.5 tablets (1.5 mg total) by mouth See admin instructions. Take 1.5 mg by mouth at bedtime and an additional 1-1.5mg  once a day as needed for anxiety 06/20/24   Cheryle Page, MD  melatonin 5 MG TABS Take 1 tablet (5 mg total) by mouth at bedtime as needed (if lorazepam  doesn't work). 06/20/24   Cheryle Page, MD  nystatin  cream (MYCOSTATIN ) Apply 1 Application topically 2 (two) times daily as needed for dry  skin. 06/07/24   [provider]  polyethylene glycol (MIRALAX  / GLYCOLAX ) 17 g packet Take 17 g by mouth daily as needed for mild constipation. 06/20/24   Cheryle Page, MD  senna-docusate (SENOKOT-S) 8.6-50 MG tablet Take 1-2 tablets by mouth 2 (two) times daily between meals as needed for mild constipation or moderate constipation. 05/16/24   Gonfa, Taye T, MD  SIMPLY SALINE NA Place 1 spray into both nostrils as needed (for dryness).    [provider]  sodium chloride  1 g tablet Take 1 tablet (1 g total) by mouth 3 (three) times daily with meals. 06/20/24   Cheryle Page, MD  triamcinolone cream (KENALOG) 0.1 % Apply 1 Application topically 2 (two) times daily as needed (Irritation). 04/05/21   [provider]  warfarin (COUMADIN ) 5 MG tablet Take 5 mg by mouth at bedtime. 04/25/15   [provider]    Allergies: Atenolol, Citalopram hydrobromide, Clindamycin /lincomycin, Codeine, Coenzyme q10, Erythromycin, Motrin [ibuprofen], Statins, Latex, and Losartan potassium    Review of Systems  Updated Vital Signs BP 136/66   Pulse 75   Temp 98 F (36.7 C) (Axillary)   Resp 20   Ht 1.626 m (5' 4)   SpO2 94%   BMI 41.13 kg/m   Physical Exam Vitals and nursing note reviewed.  Constitutional:      Appearance: She is well-developed. She is obese. She is ill-appearing and diaphoretic.  HENT:     Head: Normocephalic and atraumatic.  Eyes:     Conjunctiva/sclera: Conjunctivae normal.  Cardiovascular:     Rate and Rhythm: Normal rate and regular rhythm.  Pulmonary:     Effort: Tachypnea and accessory muscle usage present.     Breath sounds: Decreased breath sounds and wheezing present.  Abdominal:     General: There is no distension.  Musculoskeletal:     Right lower leg: Edema present.     Left lower leg: Edema present.  Skin:    General: Skin is warm.  Neurological:     Mental Status: She is alert and oriented to person, place, and time.      Cranial Nerves: No cranial nerve deficit.  Psychiatric:        Mood and Affect: Mood normal.     (all labs ordered are listed, but only abnormal results are displayed) Labs Reviewed  COMPREHENSIVE METABOLIC PANEL WITH GFR - Abnormal; Notable for the following components:      Result Value   Sodium 133 (*)    Chloride 93 (*)    Glucose, Bld 134 (*)    Calcium  8.3 (*)    Albumin 2.8 (*)    Alkaline Phosphatase 154 (*)    GFR, Estimated 59 (*)    All other components within normal limits  CBC WITH DIFFERENTIAL/PLATELET - Abnormal; Notable for the following components:   WBC 10.9 (*)    RBC 3.63 (*)    Hemoglobin 10.7 (*)    HCT 33.8 (*)    RDW 16.4 (*)    nRBC 0.3 (*)    Neutro Abs 9.1 (*)    All other components within normal limits  PRO BRAIN NATRIURETIC PEPTIDE - Abnormal; Notable for the following components:   Pro Brain Natriuretic Peptide 4,067.0 (*)    All other components within normal limits  PROTIME-INR - Abnormal; Notable for the following components:   Prothrombin Time 28.7 (*)    INR 2.6 (*)    All other components within normal limits  RESP PANEL BY RT-PCR (RSV, FLU A&B, COVID)  [id number]    EKG: None  Radiology: [facility] Chest Port 1 View Result Date: 06/21/2024 CLINICAL DATA:  Shortness of breath. EXAM: PORTABLE CHEST 1 VIEW COMPARISON:  06/10/2024, CT 06/10/2024 FINDINGS: Lungs are adequately inflated demonstrate moderate opacification over the lower half of the left hemithorax likely large effusion with associated basilar compressive atelectasis. Mild hazy density over the right base possibly small layering effusion with atelectasis. Mild hazy opacification of the perihilar markings suggesting mild degree of vascular congestion. Multiple calcified hilar/mediastinal lymph nodes. Stable cardiomegaly. Remainder of the exam is unchanged. IMPRESSION: 1. Moderate opacification over the lower half of the left hemithorax likely large effusion with associated basilar  compressive atelectasis. Possible small layering right effusion with atelectasis. 2. Stable cardiomegaly with suggestion of mild vascular congestion. Electronically Signed   By: Toribio Agreste M.D.   On: 06/21/2024 13:09     Procedures   Medications Ordered in the ED  furosemide  (LASIX ) injection 60 mg (has no administration in time range)                                    Medical Decision Making Edematous elderly female presents 1 day after discharge following presentation for shortness of breath, now with shortness of breath, new oxygen  requirement.  Patient mentating appropriately, but with tachypnea, tachycardia, findings  concerning for decompensated heart failure versus infection/bacteremia, sepsis.    Amount and/or Complexity of Data Reviewed Independent Historian: EMS    Details: Multiple breathing treatments and nonrebreather mask in transport External Data Reviewed: notes.    Details: Discharge summary from yesterday included below Labs: ordered. Decision-making details documented in ED Course. Radiology: ordered and independent interpretation performed. Decision-making details documented in ED Course. ECG/medicine tests: ordered and independent interpretation performed. Decision-making details documented in ED Course.  Risk Prescription drug management.  Brief/Interim Summary: 84 year old woman PMH including A-fib on warfarin, sarcoidosis, chronic lower extremity wounds presented with weakness and hypotension.  She was admitted by PCCM to ICU for AKI, hyperkalemia, coagulopathy, moderate pericardial effusion and initially received FFP, vitamin K , Lokelma  and bicarb infusion along with empiric antibiotics.  Echo showed EF of 45 to 50% with large pericardial effusion without tamponade.  Cardiology was consulted.  She was transferred to [facility] service from 06/14/2023 onwards.  Subsequently, cardiology has signed off.  PT recommended SNF placement.  Palliative care consulted for goals  of care discussion.  Patient currently remains full code.  She has been started on IV Lasix  and oral salt tablets for volume overload and hyponatremia.  Sodium level improving.  She is currently medically stable for discharge.  She is off antibiotics.  Repeat blood cultures have been negative so far.  She will be discharged to SNF once bed is available.   1:35 PM Patient with mild leukocytosis, preserved renal function, now with concern for respiratory distress, likely fluid overload given her abnormal x-ray, patient is absent fever, but with abnormal x-ray concern for atelectasis versus pneumonia, particular given her increased work of breathing patient will receive broad-spectrum antibiotics as well as, patient will receive IV Lasix , with ongoing oxygen  supplementation which will require admission for further monitoring, management.  Patient with therapeutic INR, has no chest pain, lower suspicion for PE.  CRITICAL CARE Performed by: Lamar Salen Total critical care time: 35 minutes Critical care time was exclusive of separately billable procedures and treating other patients. Critical care was necessary to treat or prevent imminent or life-threatening deterioration. Critical care was time spent personally by me on the following activities: development of treatment plan with patient and/or surrogate as well as nursing, discussions with consultants, evaluation of patient's response to treatment, examination of patient, obtaining history from patient or surrogate, ordering and performing treatments and interventions, ordering and review of laboratory studies, ordering and review of radiographic studies, pulse oximetry and re-evaluation of patient's condition.   Final diagnoses:  Respiratory distress     Salen Lamar, MD 06/21/24 1337  "

## 2024-06-21 NOTE — H&P (Signed)
 " History and Physical    Patient: [name] FMW:[id number] DOB: 11-Aug-1940 DOA: 06/21/2024 DOS: the patient was seen and examined on 06/21/2024 . PCP: [name], MD  Patient coming from: Home Chief complaint: Chief Complaint  Patient presents with   Shortness of Breath   HPI:  [name] is a 84 y.o. female with past medical history  of   thyroid  cancer, hyperlipidemia, hypertension, hypothyroid, anxiety, arthritis, OSA on CPAP, and history of sarcoidosis Most recently she was admitted from June 10, 2024 with shock, Staphylococcus epidermidis bacteremia, acute kidney injury, hyperkalemia, and moderate pericardial effusion and discharged yesterday June 20, 2024 comes back today with shortness of breath.   She was initially admitted to the ICU ,received fresh frozen plasma, vitamin K , lokelma , bicarbonate infusion, and empiric antibiotics. Echocardiography revealed an ejection fraction of 45-50% with a large pericardial effusion without tamponade. Pt had S. epidermidis bacteremia / completed 7 days of IV linezolid .  Patient was seen by cardiology on January 7 for her pericardial effusion with no tamponade on echo with plan for recheck echocardiogram.  Chart review shows pt was admitted on 05/12/2024 for pleuritic chest pain, tachycardia, leukocytosis (WBC 15.8), and elevated proBNP (1710). CT imaging revealed diffuse adenopathy (mediastinal, hilar, retroperitoneal, inguinal), a 16mm thyroid  nodule, and possible abdominal wall cellulitis. She developed A-fib with RVR requiring diltiazem  drip, which was successfully transitioned to oral rate control. She had one fever spike to 102.31F , neg cultures ,She underwent left inguinal lymph node biopsy by interventional radiology on 12/8, and oncology note states from 05/2024 states pt was to have PET scan and LAD biopsy thus far is reassuring.   ED Course:  Vital signs in the ED were notable for the following:  Vitals:    06/21/24 1227 06/21/24 1228 06/21/24 1633  BP:  136/66 133/75  Pulse:  75 88  Temp:  98 F (36.7 C) 98 F (36.7 C)  Resp:  20 20  Height: 5' 4 (1.626 m)    SpO2:  94% 95%  TempSrc:  Axillary Oral  >>ED evaluation thus far shows: -CMP shows sodium 133 chloride 93 glucose 134 calcium  8.3 alk phos 154 albumin 2.8, creatinine 0.94 eGFR 59 albumin 2.8. -proBNP 4067. -Lactic acid of 2.7. -CBC shows white count of 10.9 hemoglobin 10.7 platelets 175.  INR today of 2.6 glucose 134. -Viral panel negative for flu RSV and COVID. -Chest x-ray shows moderate opacification over the lower half of the left hemithorax and likely a large effusion with associated compressive atelectasis.  >>While in the ED patient received the following: Medications  furosemide  (LASIX ) injection 60 mg (has no administration in time range)  ceFEPIme  (MAXIPIME ) 2 g in sodium chloride  0.9 % 100 mL IVPB (has no administration in time range)  vancomycin  (VANCOREADY) IVPB 1750 mg/350 mL (has no administration in time range)   Review of Systems  Unable to perform ROS: Acuity of condition  Respiratory:  Positive for shortness of breath.    Past Medical History:  Diagnosis Date   Anxiety    takes Lorazepam  daily as needed   Arthritis    Cancer (HCC)    thyroid    Cataracts, bilateral    immature   Chronic back pain    compressed vertebrae,takes Fosamax weekly   Family history of adverse reaction to anesthesia    granddaughter gets very sick and mean   History of blood transfusion    no abnormal reaction noted   History of bronchitis 05/2015  History of colon polyps    benign   Hyperlipidemia    takes Zetia  daily   Hypertension    takes Diltiazem  and Avapro  daily   Hypothyroidism    takes Synthroid  daily   Ischemic colitis    hx of-many yrs ago   Joint pain    Joint swelling    OSA on CPAP    05-08-12 AHi was 46, RDI  53. titrated to   9 cm water   with 3 cm flex.-nadir 75%   PE (pulmonary embolism)     takes Coumadin  daily   Peripheral edema    Pneumonia 1992   hx of   Sarcoidosis    Shortness of breath    Urinary frequency    Weakness    left leg.Numbness and tingling.Has sciatica   Wheeze    occaionally. Not new   Past Surgical History:  Procedure Laterality Date   ABDOMINAL HYSTERECTOMY     APPENDECTOMY     BREAST BIOPSY     biopsies on right x 2   BREAST CYST ASPIRATION Left    CHOLECYSTECTOMY N/A 11/29/2015   Procedure: LAPAROSCOPIC CHOLECYSTECTOMY;  Surgeon: Jina Nephew, MD;  Location: [facility] OR;  Service: General;  Laterality: N/A;   COLONOSCOPY     DILATION AND CURETTAGE OF UTERUS     LIVER BIOPSY     MELANOMA EXCISION     removed from left leg   scalene surgery  1972   trying to find out what was wrong with her lungs   THYROID  SURGERY      reports that she has never smoked. She has never used smokeless tobacco. She reports that she does not drink alcohol  and does not use drugs. Allergies[1] Family History  Problem Relation Age of Onset   Breast cancer Mother    CVA Mother    Breast cancer Sister    Breast cancer Sister    Heart attack Neg Hx    Sleep apnea Neg Hx    Prior to Admission medications  Medication Sig Start Date End Date Taking? Authorizing Provider  acetaminophen  (TYLENOL ) 325 MG tablet Take 325-650 mg by mouth 2 (two) times daily as needed for mild pain or headache.    [provider]  Carboxymethylcellulose Sodium (REFRESH LIQUIGEL OP) Place 1 drop into both eyes as needed (Dry eye).    [provider]  carvedilol  (COREG ) 3.125 MG tablet Take 1 tablet (3.125 mg total) by mouth 2 (two) times daily with a meal. 06/20/24   Cheryle Page, MD  Cyanocobalamin (B-12 PO) Take 1 tablet by mouth daily.    [provider]  diltiazem  (CARDIZEM ) 120 MG tablet Take 1 tablet (120 mg total) by mouth every 8 (eight) hours. 06/20/24   Cheryle Page, MD  furosemide  (LASIX ) 40 MG tablet Take 40 mg by mouth daily.    [provider]  levothyroxine  (SYNTHROID ) 112 MCG tablet Take 112 mcg by mouth every morning.    [provider]  LORazepam  (ATIVAN ) 1 MG tablet Take 1.5 tablets (1.5 mg total) by mouth See admin instructions. Take 1.5 mg by mouth at bedtime and an additional 1-1.5mg  once a day as needed for anxiety 06/20/24   Cheryle Page, MD  melatonin 5 MG TABS Take 1 tablet (5 mg total) by mouth at bedtime as needed (if lorazepam  doesn't work). 06/20/24   Cheryle Page, MD  nystatin  cream (MYCOSTATIN ) Apply 1 Application topically 2 (two) times daily as needed for dry skin. 06/07/24  [provider]  polyethylene glycol (MIRALAX  / GLYCOLAX ) 17 g packet Take 17 g by mouth daily as needed for mild constipation. 06/20/24   Cheryle Page, MD  senna-docusate (SENOKOT-S) 8.6-50 MG tablet Take 1-2 tablets by mouth 2 (two) times daily between meals as needed for mild constipation or moderate constipation. 05/16/24   Gonfa, Taye T, MD  SIMPLY SALINE NA Place 1 spray into both nostrils as needed (for dryness).    [provider]  sodium chloride  1 g tablet Take 1 tablet (1 g total) by mouth 3 (three) times daily with meals. 06/20/24   Cheryle Page, MD  triamcinolone cream (KENALOG) 0.1 % Apply 1 Application topically 2 (two) times daily as needed (Irritation). 04/05/21   [provider]  warfarin (COUMADIN ) 5 MG tablet Take 5 mg by mouth at bedtime. 04/25/15   [provider]                                                                                 Vitals:   06/21/24 1227 06/21/24 1228 06/21/24 1633  BP:  136/66 133/75  Pulse:  75 88  Resp:  20 20  Temp:  98 F (36.7 C) 98 F (36.7 C)  TempSrc:  Axillary Oral  SpO2:  94% 95%  Height: 5' 4 (1.626 m)     Physical Exam Constitutional:      General: She is not in acute distress.    Appearance: She is obese. She is ill-appearing.  HENT:     Head: Atraumatic.  Eyes:     Extraocular Movements: Extraocular movements intact.   Cardiovascular:     Rate and Rhythm: Regular rhythm. Tachycardia present.     Pulses: Normal pulses.  Pulmonary:     Effort: Respiratory distress present.     Breath sounds: Examination of the right-middle field reveals rales. Examination of the left-middle field reveals rales. Examination of the right-lower field reveals rales. Examination of the left-lower field reveals rales. Rales present.  Abdominal:     General: There is no distension.     Tenderness: There is no abdominal tenderness.  Musculoskeletal:     Right lower leg: No edema.     Left lower leg: No edema.  Neurological:     General: No focal deficit present.     Mental Status: She is oriented to person, place, and time.     Labs on Admission: I have personally reviewed following labs and imaging studies CBC: Recent Labs  Lab 06/16/24 0612 06/18/24 0529 06/19/24 0514 06/21/24 1237  WBC 9.1 8.3 7.9 10.9*  NEUTROABS  --  5.7 5.8 9.1*  HGB 10.5* 10.3* 10.8* 10.7*  HCT 32.6* 31.4* 32.6* 33.8*  MCV 91.1 91.0 91.1 93.1  PLT 182 227 217 175   Basic Metabolic Panel: Recent Labs  Lab 06/16/24 0612 06/17/24 0556 06/18/24 0529 06/19/24 0514 06/20/24 0447 06/21/24 1237  NA 130* 127* 126* 126* 128* 133*  K 4.4 4.5 4.4 4.3 4.1 4.2  CL 93* 91* 90* 89* 90* 93*  CO2 23 26 28 28 29  32  GLUCOSE 86 86 103* 106* 87 134*  BUN 27* 30* 29* 27* 25* 22  CREATININE 1.12*  1.22* 1.30* 1.23* 1.01* 0.94  CALCIUM  8.7* 8.8* 8.7* 8.7* 8.5* 8.3*  MG 2.4 2.5* 2.4 2.1 1.9  --    GFR: Estimated Creatinine Clearance: 52.7 mL/min (by C-G formula based on SCr of 0.94 mg/dL). Liver Function Tests: Recent Labs  Lab 06/16/24 0612 06/21/24 1237  AST 26 26  ALT 17 16  ALKPHOS 140* 154*  BILITOT 0.8 0.6  PROT 6.9 7.0  ALBUMIN 2.6* 2.8*   No results for input(s): LIPASE, AMYLASE in the last 168 hours. No results for input(s): AMMONIA in the last 168 hours. Recent Labs    06/11/24 1905 06/12/24 0238 06/13/24 0249  06/14/24 0512 06/16/24 0612 06/17/24 0556 06/18/24 0529 06/19/24 0514 06/20/24 0447 06/21/24 1237  BUN 103* 82* 63* 41* 27* 30* 29* 27* 25* 22  CREATININE 2.19* 1.70* 1.26* 1.00 1.12* 1.22* 1.30* 1.23* 1.01* 0.94    Cardiac Enzymes: No results for input(s): CKTOTAL, CKMB, CKMBINDEX, TROPONINI in the last 168 hours. BNP (last 3 results) Recent Labs    05/12/24 1806 06/10/24 0824 06/21/24 1237  PROBNP 1,710.0* 6,454.0* 4,067.0*   HbA1C: No results for input(s): HGBA1C in the last 72 hours. CBG: Recent Labs  Lab 06/19/24 1608 06/19/24 2015 06/20/24 0721 06/20/24 1110 06/21/24 1636  GLUCAP 120* 117* 95 147* 120*   Lipid Profile: No results for input(s): CHOL, HDL, LDLCALC, TRIG, CHOLHDL, LDLDIRECT in the last 72 hours. Thyroid  Function Tests: No results for input(s): TSH, T4TOTAL, FREET4, T3FREE, THYROIDAB in the last 72 hours. Anemia Panel: No results for input(s): VITAMINB12, FOLATE, FERRITIN, TIBC, IRON, RETICCTPCT in the last 72 hours. Urine analysis:    Component Value Date/Time   COLORURINE YELLOW 06/10/2024 1450   APPEARANCEUR HAZY (A) 06/10/2024 1450   LABSPEC 1.026 06/10/2024 1450   PHURINE 5.0 06/10/2024 1450   GLUCOSEU NEGATIVE 06/10/2024 1450   HGBUR NEGATIVE 06/10/2024 1450   BILIRUBINUR NEGATIVE 06/10/2024 1450   KETONESUR NEGATIVE 06/10/2024 1450   PROTEINUR 30 (A) 06/10/2024 1450   UROBILINOGEN 0.2 06/02/2007 1024   NITRITE NEGATIVE 06/10/2024 1450   LEUKOCYTESUR SMALL (A) 06/10/2024 1450   Radiological Exams on Admission: ECHOCARDIOGRAM LIMITED Result Date: 06/21/2024    ECHOCARDIOGRAM LIMITED REPORT   Patient Name:   DEBBE CRUMBLE Dhingra Date of Exam: 06/21/2024 Medical Rec #:  [id number]            Height:       64.0 in Accession #:    [id number]           Weight:       239.6 lb Date of Birth:  27-Dec-1940            BSA:          2.113 m Patient Age:    83 years             BP:           144/74 mmHg  Patient Gender: F                    HR:           94 bpm. Exam Location:  Inpatient Procedure: Limited Echo, Cardiac Doppler and Color Doppler (Both Spectral and            Color Flow Doppler were utilized during procedure). Indications:     Pericardial Effusion I31.3  History:         Patient has prior history of Echocardiogram examinations, most  recent 06/14/2024. Pericardial Effusion; Risk Factors:Sleep Apnea                  and Hypertension.  Sonographer:     Koleen Popper RDCS Referring Phys:  [id number] KEVEN FILA Diagnosing Phys: Darryle Decent MD  Sonographer Comments: Image acquisition challenging due to patient body habitus. IMPRESSIONS  1. Moderate pericardial effusion which is more prominent up to 2.0 cm posterior to the LV (best seen subcostal; image 49). The RV is poorly visualized, but does not appear to collapse in diastole. The IVC is dilated. There is evidence of respiratory variation in septal movement on image 41. Findings are suggestive of a clinically significant pericardial effusion. Clinical correlation is recommended. Appearance is stable compared to prior study. Moderate pericardial effusion. The pericardial effusion  is circumferential.  2. Right ventricular systolic function was not well visualized. The right ventricular size is not well visualized. Tricuspid regurgitation signal is inadequate for assessing PA pressure.  3. The mitral valve is grossly normal. Trivial mitral valve regurgitation. No evidence of mitral stenosis.  4. The aortic valve is tricuspid. Aortic valve regurgitation is not visualized. Aortic valve sclerosis is present, with no evidence of aortic valve stenosis.  5. The inferior vena cava is dilated in size with <50% respiratory variability, suggesting right atrial pressure of 15 mmHg.  6. Left ventricular ejection fraction, by estimation, is 60 to 65%. The left ventricle has normal function. Left ventricular endocardial border not optimally defined  to evaluate regional wall motion. Comparison(s): No significant change from prior study. FINDINGS  Left Ventricle: Left ventricular ejection fraction, by estimation, is 60 to 65%. The left ventricle has normal function. Left ventricular endocardial border not optimally defined to evaluate regional wall motion. The left ventricular internal cavity size was normal in size. There is no left ventricular hypertrophy. Right Ventricle: The right ventricular size is not well visualized. Right vetricular wall thickness was not well visualized. Right ventricular systolic function was not well visualized. Tricuspid regurgitation signal is inadequate for assessing PA pressure. Left Atrium: Left atrial size was normal in size. Right Atrium: Right atrial size was normal in size. Pericardium: Moderate pericardial effusion which is more prominent up to 2.0 cm posterior to the LV (best seen subcostal; image 49). The RV is poorly visualized, but does not appear to collapse in diastole. The IVC is dilated. There is evidence of respiratory variation in septal movement on image 41. Findings are suggestive of a clinically significant pericardial effusion. Clinical correlation is recommended. Appearance is stable compared to prior study. A moderately sized pericardial effusion is present. The pericardial effusion is circumferential. Presence of epicardial fat layer. Mitral Valve: The mitral valve is grossly normal. Trivial mitral valve regurgitation. No evidence of mitral valve stenosis. Aortic Valve: The aortic valve is tricuspid. Aortic valve regurgitation is not visualized. Aortic valve sclerosis is present, with no evidence of aortic valve stenosis. Pulmonic Valve: The pulmonic valve was grossly normal. Pulmonic valve regurgitation is trivial. No evidence of pulmonic stenosis. Aorta: The aortic root and ascending aorta are structurally normal, with no evidence of dilitation. Venous: The inferior vena cava is dilated in size with less  than 50% respiratory variability, suggesting right atrial pressure of 15 mmHg. IAS/Shunts: The atrial septum is grossly normal. Additional Comments: Spectral Doppler performed. Color Doppler performed.  LEFT VENTRICLE PLAX 2D LVIDd:         4.60 cm LVIDs:         3.30 cm LV PW:  0.90 cm LV IVS:        1.10 cm LVOT diam:     2.00 cm LV SV:         58 LV SV Index:   28 LVOT Area:     3.14 cm  LV Volumes (MOD) LV vol d, MOD A4C: 122.0 ml LV vol s, MOD A4C: 44.8 ml LV SV MOD A4C:     122.0 ml IVC IVC diam: 2.40 cm LEFT ATRIUM           Index LA diam:      3.90 cm 1.85 cm/m LA Vol (A4C): 52.0 ml 24.61 ml/m  AORTIC VALVE LVOT Vmax:   103.00 cm/s LVOT Vmean:  67.600 cm/s LVOT VTI:    0.185 m  AORTA Ao Root diam: 3.10 cm Ao Asc diam:  3.30 cm  SHUNTS Systemic VTI:  0.18 m Systemic Diam: 2.00 cm Darryle Decent MD Electronically signed by Darryle Decent MD Signature Date/Time: 06/21/2024/6:21:03 PM    Final (Updated)    DG Chest Port 1 View Result Date: 06/21/2024 CLINICAL DATA:  Shortness of breath. EXAM: PORTABLE CHEST 1 VIEW COMPARISON:  06/10/2024, CT 06/10/2024 FINDINGS: Lungs are adequately inflated demonstrate moderate opacification over the lower half of the left hemithorax likely large effusion with associated basilar compressive atelectasis. Mild hazy density over the right base possibly small layering effusion with atelectasis. Mild hazy opacification of the perihilar markings suggesting mild degree of vascular congestion. Multiple calcified hilar/mediastinal lymph nodes. Stable cardiomegaly. Remainder of the exam is unchanged. IMPRESSION: 1. Moderate opacification over the lower half of the left hemithorax likely large effusion with associated basilar compressive atelectasis. Possible small layering right effusion with atelectasis. 2. Stable cardiomegaly with suggestion of mild vascular congestion. Electronically Signed   By: Toribio Agreste M.D.   On: 06/21/2024 13:09   Data Reviewed: Relevant notes  from primary care and specialist visits, past discharge summaries as available in EHR, including Care Everywhere . Prior diagnostic testing as pertinent to current admission diagnoses, Updated medications and problem lists for reconciliation .ED course, including vitals, labs, imaging, treatment and response to treatment,Triage notes, nursing and pharmacy notes and ED provider's notes.Notable results as noted in HPI.Discussed case with EDMD/ ED APP/ or Specialty MD on call and as needed.  Assessment & Plan  >>Acute hypoxemic respiratory failure secondary to large left pleural effusion: Patient presenting in acute distress with dyspnea and large left pleural effusion one day after hospital discharge.  X-ray shows moderate opacification over the lower half of the left hemithorax consistent with large effusion and compressive atelectasis, with possible small right effusion.Differentials include pleural effusion ,pericardial effusion, hospital-acquired pneumonia.Malignancy-related - concerning given diffuse adenopathy on prior CT, pending PET scan.Continue supplemental oxygen  to maintain SpO2 >92%. Pulmonology consultation for thoracentesis and further management.  >>Healthcare-associated pneumonia: With discharge yesterday and now presenting with dyspnea, pleural effusion, elevated lactate (2.7), and possible infiltrate on chest X-ray will add patient on broad-spectrum IV antibiotics with vancomycin  cefepime  after cultures.  Sputum cultures with expectorated sputum expanded respiratory viral panel as needed albuterol  and DuoNeb treatments chest incentive spirometry.  >>Atrial fibrillation with history of pulmonary embolism Patient on warfarin with INR 2.6 (therapeutic). Currently rate-controlled on diltiazem  and carvedilol .  Continue warfarin management per pharmacy.  >>DMII: Controlled with last A1c of 7.6 .Glycemic protocol, currently NPO - may have sips and meds.   >>Acute kidney injury: Currently  resolving , Creatinine improved from 2.19 on admission 06/10/24 to 0.94 today (eGFR 59).  Monitor  renal function and limit contrast studies . Lab Results  Component Value Date   CREATININE 0.94 06/21/2024   CREATININE 1.01 (H) 06/20/2024   CREATININE 1.23 (H) 06/19/2024   >>Elevated proBNP with pericardial effusion ProBNP 4067 (down from 6454), with known moderate pericardial effusion without tamponade on recent echo (EF 45-50%).  Will also request cardiology consult once patient has been evaluated by pulmonology.  >>Lactic acidosis Lactate 2.7, likely related to respiratory distress and tissue hypoxia from large pleural effusion.we will also request cardiology consult.   >>Obstructive sleep apnea Continue home CPAP per home settings.   >>Hypothyroidism Continue levothyroxine  112 mcg daily  >>Hyperlipidemia Continue ezetimibe  .  DVT prophylaxis:  Coumadin . Consults:  Pulmonology.   Advance Care Planning:    Code Status: Full Code   Family Communication:  None  Disposition Plan:  TBD. Severity of Illness: The appropriate patient status for this patient is INPATIENT. Inpatient status is judged to be reasonable and necessary in order to provide the required intensity of service to ensure the patient's safety. The patient's presenting symptoms, physical exam findings, and initial radiographic and laboratory data in the context of their chronic comorbidities is felt to place them at high risk for further clinical deterioration. Furthermore, it is not anticipated that the patient will be medically stable for discharge from the hospital within 2 midnights of admission.   * I certify that at the point of admission it is my clinical judgment that the patient will require inpatient hospital care spanning beyond 2 midnights from the point of admission due to high intensity of service, high risk for further deterioration and high frequency of surveillance required.*  Unresulted Labs (From  admission, onward)     Start     Ordered   06/22/24 0500  Basic metabolic panel  Tomorrow morning,   R        06/21/24 1544   06/22/24 0500  Magnesium   Tomorrow morning,   R        06/21/24 1544   06/22/24 0500  CBC with Differential/Platelet  Tomorrow morning,   R        06/21/24 1620   06/22/24 0500  Protime-INR  Daily,   R      06/21/24 1717   06/21/24 1415  Prolactin  Add-on,   AD        06/21/24 1414           Meds ordered this encounter  Medications   furosemide  (LASIX ) injection 60 mg   DISCONTD: vancomycin  (VANCOCIN ) IVPB 1000 mg/200 mL premix    Indication::   Other Indication (list below)    Other Indication::   HCAP   ceFEPIme  (MAXIPIME ) 2 g in sodium chloride  0.9 % 100 mL IVPB    Antibiotic Indication::   HCAP   vancomycin  (VANCOREADY) IVPB 1750 mg/350 mL    Indication::   Other Indication (list below)    Other Indication::   HCAP   sodium chloride  flush (NS) 0.9 % injection 3 mL   OR Linked Order Group    acetaminophen  (TYLENOL ) tablet 650 mg    acetaminophen  (TYLENOL ) suppository 650 mg   OR Linked Order Group    ondansetron  (ZOFRAN ) tablet 4 mg    ondansetron  (ZOFRAN ) injection 4 mg   guaiFENesin  (MUCINEX ) 12 hr tablet 600 mg   polyethylene glycol (MIRALAX  / GLYCOLAX ) packet 17 g   carvedilol  (COREG ) tablet 3.125 mg   diltiazem  (CARDIZEM ) tablet 120 mg   levothyroxine  (SYNTHROID ) tablet 112 mcg  Warfarin - Pharmacist Dosing Inpatient   warfarin (COUMADIN ) tablet 5 mg   insulin  aspart (novoLOG ) injection 0-9 Units    Correction coverage::   Sensitive (thin, NPO, renal)    CBG < 70::   Implement Hypoglycemia Standing Orders and refer to Hypoglycemia Standing Orders sidebar report    CBG 70 - 120::   0 units    CBG 121 - 150::   1 unit    CBG 151 - 200::   2 units    CBG 201 - 250::   3 units    CBG 251 - 300::   5 units    CBG 301 - 350::   7 units    CBG 351 - 400:   9 units    CBG > 400:   call MD and obtain STAT lab verification   DISCONTD:  vancomycin  (VANCOREADY) IVPB 2000 mg/400 mL    Indication::   Pneumonia   ceFEPIme  (MAXIPIME ) 2 g in sodium chloride  0.9 % 100 mL IVPB    Antibiotic Indication::   HCAP   vancomycin  (VANCOCIN ) IVPB 1000 mg/200 mL premix    Indication::   Pneumonia   furosemide  (LASIX ) injection 40 mg   Orders Placed This Encounter  Procedures   Resp panel by RT-PCR (RSV, Flu A&B, Covid) Anterior Nasal Swab   DG Chest Port 1 View   Comprehensive metabolic panel   CBC with Differential/Platelet   Pro Brain natriuretic peptide   Protime-INR   Prolactin   Basic metabolic panel   Magnesium    CBC with Differential/Platelet   Protime-INR   Diet NPO time specified Except for: Sips with Meds, Ice Chips   ED Cardiac monitoring   Initiate Carrier Fluid Protocol   Cardiac Monitoring Continuous x 24 hours Indications for use: Other; other indications for use: COPD exacerbation   Maintain IV access   Vital signs   Notify physician (specify)   Refer to Sidebar Report Mobility Protocol for Adult Inpatient   Initiate Adult Central Line Maintenance and Catheter Clearance Protocol for patients with central line (CVC, PICC, Port, Hemodialysis, [place])   If patient diabetic or glucose greater than 140 notify physician for Sliding Scale Insulin  Orders   Intake and Output   Initiate CHG Protocol for patients in ICU/SD or any patient with a central line or foley catheter   Do not place and if present remove PureWick   Initiate Oral Care Protocol   Initiate Carrier Fluid Protocol   Nurse to provide smoking / tobacco cessation education   RN may order General Admission PRN Orders utilizing General Admission PRN medications (through manage orders) for the following patient needs: allergy symptoms (Claritin), cold sores (Carmex), cough (Robitussin DM), eye irritation (Liquifilm Tears), hemorrhoids (Tucks), indigestion (Maalox), minor skin irritation (Hydrocortisone Cream), muscle pain Lucienne Gay), nose irritation (saline  nasal spray) and sore throat (Chloraseptic spray).   Ambulate with assistance   Bladder scan   Care order/instruction: If not retaining on bladder scan, please place purewick for accurate monitoring of strict I&Os   Apply Diabetes Mellitus Care Plan   STAT CBG when hypoglycemia is suspected. If treated, recheck every 15 minutes after each treatment until CBG >/= 70 mg/dl   Refer to Hypoglycemia Protocol Sidebar Report for treatment of CBG < 70 mg/dl   Full code   Consult to hospitalist   Nutritional services consult   Consult to Transition of Care Team   Consult to respiratory care treatment   warfarin (COUMADIN ) per pharmacy consult  Inpatient consult to Cardiology   Consult to pulmonology   vancomycin  per pharmacy consult   CeFEPIme  (MAXIPIME ) per pharmacy consult            OT eval and treat   PT eval and treat   ED Pulse oximetry, continuous   Pulse oximetry check with vital signs   Oxygen  therapy Mode or (Route): Nasal cannula; Liters Per Minute: 2; Keep O2 saturation between: greater than 92 %   I-Stat CG4 Lactic Acid   CBG monitoring, ED   ED EKG   EKG 12-Lead   ECHOCARDIOGRAM LIMITED   Saline lock IV   Admit to Inpatient (patient's expected length of stay will be greater than 2 midnights or inpatient only procedure)   Aspiration precautions   Fall precautions   Author: Mario LULLA Blanch, MD 12 pm- 8 pm. [facility]. 06/21/2024 7:19 PM Please note for any communication after hours contact [facility] Assigned provider on call on Amion.    [1]  Allergies Allergen Reactions   Atenolol Other (See Comments)    Depression    Citalopram Hydrobromide Nausea Only and Other (See Comments)    Nausea/Fatigue   Clindamycin /Lincomycin Other (See Comments)    Via IV, has caused a metallic taste in the mouth    Codeine Other (See Comments)    Caused Hyperactivity and Dysphoria   Coenzyme Q10 Itching   Erythromycin Itching, Anxiety and Other (See Comments)    Also with jitters    Motrin [Ibuprofen] Other (See Comments)    Is to not take this   Statins Other (See Comments)    Leg muscle weakness w/ rosuvastatin and simvastatin    Latex Rash   Losartan Potassium Other (See Comments)    Muscle aches    "

## 2024-06-21 NOTE — Progress Notes (Signed)
" °  Echocardiogram 2D Echocardiogram has been performed.  [name], RDCS 06/21/2024, 5:47 PM "

## 2024-06-21 NOTE — Consult Note (Signed)
 "  NAME:  [name], MRN:  [id number], DOB:  [birth year], LOS: 0 ADMISSION DATE:  06/21/2024, CONSULTATION DATE:  06/21/24 REFERRING MD:  Dr. Tobie , CHIEF COMPLAINT:  shortness of breath   History of Present Illness:  [name] is a 84 y.o. female with PMH of atrial fibrillation, OSA, hypothyroidism, HFpEF, and recent admission for hypotension, AKI, coumadin  coagulapathy and pericardial effusion discharged to SNF yesterday 06/20/24, who presents back to the ED today for shortness of breath and hypoxia while working with PT at SNF, reportedly SPO2 dropped to 80% on baseline O2, and was increased to 8L [place]. During recent hospitalization patient was started on lasix  1/9 for concern of volume overload, but hospital PT notes mention patient would desat into the 80s with exertion at that time. Today patient reports she is always short of breath but she did notice more shortness of breath with PT today and it took a while to get her SPO2 > 90%. At time of consult patient is back down to 3 L [place] with SPO2 > 90% and she is feeling better at rest. She denies any other complaints. BNP noted to be ~4,000 from 6,000 a few days prior, CXR concerning for edema. On POCUS small left effusion noted but no safe window for thoracentesis, and should likely improve with further diuresis. Repeat limited echo is ordered for evaluation of the pericardial effusion noted last admission.   Pertinent  Medical History   has a past medical history of Anxiety, Arthritis, Cancer (HCC), Cataracts, bilateral, Chronic back pain, Family history of adverse reaction to anesthesia, History of blood transfusion, History of bronchitis (05/2015), History of colon polyps, Hyperlipidemia, Hypertension, Hypothyroidism, Ischemic colitis, Joint pain, Joint swelling, OSA on CPAP, PE (pulmonary embolism), Peripheral edema, Pneumonia (1992), Sarcoidosis, Shortness of breath, Urinary frequency, Weakness, and Wheeze.   Significant Hospital  Events: Including procedures, antibiotic start and stop dates in addition to other pertinent events   1/13 consult for hypoxia, new baseline is 3L [place] from recent hospitalization   Interim History / Subjective:  See HPI  Objective    Blood pressure 133/75, pulse 88, temperature 98 F (36.7 C), temperature source Oral, resp. rate 20, height 5' 4 (1.626 m), SpO2 95%.        Intake/Output Summary (Last 24 hours) at 06/21/2024 1636 Last data filed at 06/21/2024 1428 Gross per 24 hour  Intake 100 ml  Output --  Net 100 ml   There were no vitals filed for this visit.  Examination: Physical Exam Vitals and nursing note reviewed.  Constitutional:      General: She is not in acute distress.    Appearance: She is obese.     Comments: Chronically ill and weak appearing  HENT:     Head: Normocephalic and atraumatic.     Mouth/Throat:     Mouth: Mucous membranes are moist.  Eyes:     Pupils: Pupils are equal, round, and reactive to light.  Cardiovascular:     Rate and Rhythm: Normal rate. Rhythm irregular.     Comments: AFIB Pulmonary:     Breath sounds: No decreased breath sounds, wheezing, rhonchi or rales.  Musculoskeletal:     Comments: Bilateral lower extremities wrapped  Skin:    General: Skin is warm and dry.  Neurological:     General: No focal deficit present.     Mental Status: She is alert and oriented to person, place, and time.      Resolved problem list  Assessment and Plan   Shortness of Breath Pericardial effusion Suspect acute on chronic diastolic congestive heart failure, HFpEF (45-50%) Acute on Chronic Hypoxic Respiratory Failure (baseline 3L) worsened with exertion Patient discharged 1/12 to SNF, during that hospitalization patient was started on diuretics 1/9 for concern of volume overload. Re-admitted for shortness of breath and hypoxia while ambulating with PT at SNF, found to have SPO2 in the 80% and O2 was increased to 8L [place]. PT notes during  hospitalization mention that patient was desaturate with any exertion into the 80s on 2-3 L [place].  -At time of consult patient weaned to baseline 3 L [place], SPO2 >90% -CXR concerning for edema like pattern, no safe window for thoracentesis on PCOUS on evaluation -Will start IV Lasix  40mg  BID, further adjustments as necessary -Fluid restriction 1532ml/daily -Monitor electrolytes  -Strict I&O monitoring, bladder scan with foley or purewick ordered  -Repeat limited echocardiogram ordered to evaluate pericardial effusion; previous plan was to follow up with cardiology in 3-6 months  -Recommend cariology consultation  Atrial Fibrillation  -Continue carvedilol  and cardizem   -Previously on Coumadin , however was supratherapeutic last hospitalization and recommendation was to continue DOAC on discharge. It appears coumadin  has been ordered on admission, consider DOAC if indicated   Recent treatment for staph epidermidis  Completed course of Linezolid  during previous hospitalization  History of OSA -cpap   Lymphadenopathy -inguinal node biopsy negative 12/19, unlikely to be malignant per oncology.   History of Hypothyroidism   Labs   CBC: Recent Labs  Lab 06/16/24 0612 06/18/24 0529 06/19/24 0514 06/21/24 1237  WBC 9.1 8.3 7.9 10.9*  NEUTROABS  --  5.7 5.8 9.1*  HGB 10.5* 10.3* 10.8* 10.7*  HCT 32.6* 31.4* 32.6* 33.8*  MCV 91.1 91.0 91.1 93.1  PLT 182 227 217 175    Basic Metabolic Panel: Recent Labs  Lab 06/16/24 0612 06/17/24 0556 06/18/24 0529 06/19/24 0514 06/20/24 0447 06/21/24 1237  NA 130* 127* 126* 126* 128* 133*  K 4.4 4.5 4.4 4.3 4.1 4.2  CL 93* 91* 90* 89* 90* 93*  CO2 23 26 28 28 29  32  GLUCOSE 86 86 103* 106* 87 134*  BUN 27* 30* 29* 27* 25* 22  CREATININE 1.12* 1.22* 1.30* 1.23* 1.01* 0.94  CALCIUM  8.7* 8.8* 8.7* 8.7* 8.5* 8.3*  MG 2.4 2.5* 2.4 2.1 1.9  --    GFR: Estimated Creatinine Clearance: 52.7 mL/min (by C-G formula based on SCr of 0.94  mg/dL). Recent Labs  Lab 06/16/24 0612 06/18/24 0529 06/19/24 0514 06/21/24 1237 06/21/24 1501  WBC 9.1 8.3 7.9 10.9*  --   LATICACIDVEN  --   --   --   --  2.7*    Liver Function Tests: Recent Labs  Lab 06/16/24 0612 06/21/24 1237  AST 26 26  ALT 17 16  ALKPHOS 140* 154*  BILITOT 0.8 0.6  PROT 6.9 7.0  ALBUMIN 2.6* 2.8*   No results for input(s): LIPASE, AMYLASE in the last 168 hours. No results for input(s): AMMONIA in the last 168 hours.  ABG No results found for: PHART, PCO2ART, PO2ART, HCO3, TCO2, ACIDBASEDEF, O2SAT   Coagulation Profile: Recent Labs  Lab 06/17/24 1131 06/18/24 0529 06/19/24 0514 06/20/24 0447 06/21/24 1237  INR 1.5* 1.5* 1.9* 2.2* 2.6*    Cardiac Enzymes: No results for input(s): CKTOTAL, CKMB, CKMBINDEX, TROPONINI in the last 168 hours.  HbA1C: Hgb A1c MFr Bld  Date/Time Value Ref Range Status  06/10/2024 04:41 PM 7.6 (H) 4.8 - 5.6 % Final  Comment:    (NOTE) Diagnosis of Diabetes The following HbA1c ranges recommended by the American Diabetes Association (ADA) may be used as an aid in the diagnosis of diabetes mellitus.  Hemoglobin             Suggested A1C NGSP%              Diagnosis  <5.7                   Non Diabetic  5.7-6.4                Pre-Diabetic  >6.4                   Diabetic  <7.0                   Glycemic control for                       adults with diabetes.      CBG: Recent Labs  Lab 06/19/24 1126 06/19/24 1608 06/19/24 2015 06/20/24 0721 06/20/24 1110  GLUCAP 127* 120* 117* 95 147*    Review of Systems:   See HPI  Past Medical History:  She,  has a past medical history of Anxiety, Arthritis, Cancer (HCC), Cataracts, bilateral, Chronic back pain, Family history of adverse reaction to anesthesia, History of blood transfusion, History of bronchitis (05/2015), History of colon polyps, Hyperlipidemia, Hypertension, Hypothyroidism, Ischemic colitis, Joint pain,  Joint swelling, OSA on CPAP, PE (pulmonary embolism), Peripheral edema, Pneumonia (1992), Sarcoidosis, Shortness of breath, Urinary frequency, Weakness, and Wheeze.   Surgical History:   Past Surgical History:  Procedure Laterality Date   ABDOMINAL HYSTERECTOMY     APPENDECTOMY     BREAST BIOPSY     biopsies on right x 2   BREAST CYST ASPIRATION Left    CHOLECYSTECTOMY N/A 11/29/2015   Procedure: LAPAROSCOPIC CHOLECYSTECTOMY;  Surgeon: Jina Nephew, MD;  Location: [facility] OR;  Service: General;  Laterality: N/A;   COLONOSCOPY     DILATION AND CURETTAGE OF UTERUS     LIVER BIOPSY     MELANOMA EXCISION     removed from left leg   scalene surgery  1972   trying to find out what was wrong with her lungs   THYROID  SURGERY       Social History:   reports that she has never smoked. She has never used smokeless tobacco. She reports that she does not drink alcohol  and does not use drugs.   Family History:  Her family history includes Breast cancer in her mother, sister, and sister; CVA in her mother. There is no history of Heart attack or Sleep apnea.   Allergies Allergies[1]   Home Medications  Prior to Admission medications  Medication Sig Start Date End Date Taking? Authorizing Provider  acetaminophen  (TYLENOL ) 325 MG tablet Take 325-650 mg by mouth 2 (two) times daily as needed for mild pain or headache.   Yes [provider]  bisacodyl (DULCOLAX) 10 MG suppository Place 10 mg rectally daily as needed for moderate constipation.   Yes [provider]  Carboxymethylcellulose Sodium (REFRESH LIQUIGEL OP) Place 1 drop into both eyes 2 (two) times daily as needed (Dry eye).   Yes [provider]  carvedilol  (COREG ) 3.125 MG tablet Take 1 tablet (3.125 mg total) by mouth 2 (two) times daily with a meal. 06/20/24  Yes Cheryle Page, MD  cyanocobalamin (VITAMIN B12) 1000 MCG tablet  Take 1,000 mcg by mouth daily.   Yes [provider]  diltiazem  (CARDIZEM )  120 MG tablet Take 1 tablet (120 mg total) by mouth every 8 (eight) hours. 06/20/24  Yes Cheryle Page, MD  furosemide  (LASIX ) 40 MG tablet Take 40 mg by mouth daily.   Yes [provider]  levothyroxine  (SYNTHROID ) 112 MCG tablet Take 112 mcg by mouth every morning.   Yes [provider]  LORazepam  (ATIVAN ) 1 MG tablet Take 1.5 tablets (1.5 mg total) by mouth See admin instructions. Take 1.5 mg by mouth at bedtime and an additional 1-1.5mg  once a day as needed for anxiety Patient taking differently: Take 1-1.5 mg by mouth See admin instructions. Take 1.5 mg by mouth at bedtime.  PRN order: take 1 mg by mouth every 24 hours as needed for anxiety. 06/20/24  Yes Cheryle Page, MD  magnesium  hydroxide (MILK OF MAGNESIA) 400 MG/5ML suspension Take 30 mLs by mouth daily as needed for mild constipation or moderate constipation.   Yes [provider]  melatonin 5 MG TABS Take 1 tablet (5 mg total) by mouth at bedtime as needed (if lorazepam  doesn't work). Patient taking differently: Take 5 mg by mouth at bedtime as needed (insomnia). 06/20/24  Yes Cheryle Page, MD  polyethylene glycol (MIRALAX  / GLYCOLAX ) 17 g packet Take 17 g by mouth daily as needed for mild constipation. 06/20/24  Yes Cheryle Page, MD  senna-docusate (SENOKOT-S) 8.6-50 MG tablet Take 1-2 tablets by mouth 2 (two) times daily between meals as needed for mild constipation or moderate constipation. Patient taking differently: Take 2 tablets by mouth 2 (two) times daily as needed for mild constipation or moderate constipation. 05/16/24  Yes Gonfa, Taye T, MD  SIMPLY SALINE NA Place 1 spray into both nostrils every 12 (twelve) hours as needed (for dryness).   Yes [provider]  sodium chloride  1 g tablet Take 1 tablet (1 g total) by mouth 3 (three) times daily with meals. 06/20/24  Yes Cheryle Page, MD  Sodium Phosphates (ENEMA RE) Place 1 Dose rectally daily as needed (constipation).   Yes [provider]  warfarin (COUMADIN ) 5 MG tablet Take 5 mg by mouth at bedtime. 04/25/15  Yes [provider]  diltiazem  (CARDIZEM  CD) 360 MG 24 hr capsule Take 360 mg by mouth at bedtime. Patient not taking: Reported on 06/21/2024    [provider]  irbesartan  (AVAPRO ) 75 MG tablet Take 75 mg by mouth daily. Patient not taking: Reported on 06/21/2024    [provider]  nebivolol  (BYSTOLIC ) 5 MG tablet Take 5 mg by mouth daily. Patient not taking: Reported on 06/21/2024    [provider]  nystatin  cream (MYCOSTATIN ) Apply 1 Application topically 2 (two) times daily as needed for dry skin. Patient not taking: Reported on 06/21/2024 06/07/24   [provider]  triamcinolone cream (KENALOG) 0.1 % Apply 1 Application topically 2 (two) times daily as needed (Irritation). Patient not taking: Reported on 06/21/2024 04/05/21   [provider]     Keven Fila, PA-C [facility] For pager details, please see AMION or use Epic chat  After 1900, please call [facility] for cross coverage needs 06/21/2024, 4:36 PM       [1]  Allergies Allergen Reactions   Atenolol Other (See Comments)    Depression    Citalopram Hydrobromide Nausea Only and Other (See Comments)    Nausea/Fatigue   Clindamycin /Lincomycin Other (See Comments)    Via  IV, has caused a metallic taste in the mouth    Codeine Other (See Comments)    Caused Hyperactivity and Dysphoria   Coenzyme Q10 Itching   Erythromycin Itching, Anxiety and Other (See Comments)    Also with jitters   Motrin [Ibuprofen] Other (See Comments)    Is to not take this   Statins Other (See Comments)    Leg muscle weakness w/ rosuvastatin and simvastatin    Latex Rash   Losartan Potassium Other (See Comments)    Muscle aches    "

## 2024-06-21 NOTE — ED Notes (Signed)
 [facility] CALLED

## 2024-06-21 NOTE — ED Triage Notes (Addendum)
 Pt bib [facility] from [facility] c/o shob. Just arrived to [facility] today from hospital for weakness to be treated for physical therapy. When PT stood her up her O2 dropped to 80s. Baseline roomair, only uses O2 at night and in the hospital. Facility placed her on 8L [place], spo2 increased to 89. Diminished lung sounds. 2 duonebs given. Up to 94%. Hx of CHF and afib

## 2024-06-21 NOTE — Progress Notes (Addendum)
 Pharmacy Antibiotic Note  [name] is a 84 y.o. female admitted on 06/21/2024 with pneumonia.  Pharmacy has been consulted for vanc/cefepime  dosing.  Plan: Vancomycin  1g IV q24h (eAUC 547.8, Vd 0.5, Scr 1.01) Cefepime  2g IV q8h Monitor renal function for adjustment and micro data to de-escalate as appropriate  Height: 5' 4 (162.6 cm) IBW/kg (Calculated) : 54.7  Temp (24hrs), Avg:98 F (36.7 C), Min:98 F (36.7 C), Max:98 F (36.7 C)  Recent Labs  Lab 06/16/24 0612 06/17/24 0556 06/18/24 0529 06/19/24 0514 06/20/24 0447 06/21/24 1237 06/21/24 1501  WBC 9.1  --  8.3 7.9  --  10.9*  --   CREATININE 1.12* 1.22* 1.30* 1.23* 1.01* 0.94  --   LATICACIDVEN  --   --   --   --   --   --  2.7*    Estimated Creatinine Clearance: 52.7 mL/min (by C-G formula based on SCr of 0.94 mg/dL).    Allergies[1]   Thank you for allowing pharmacy to be a part of this patients care.  Elma Fail, PharmD PGY1 Clinical Pharmacist [facility]  06/21/2024 4:33 PM      [1]  Allergies Allergen Reactions   Atenolol Other (See Comments)    Depression    Citalopram Hydrobromide Nausea Only and Other (See Comments)    Nausea/Fatigue   Clindamycin /Lincomycin Other (See Comments)    Via IV, has caused a metallic taste in the mouth    Codeine Other (See Comments)    Caused Hyperactivity and Dysphoria   Coenzyme Q10 Itching   Erythromycin Itching, Anxiety and Other (See Comments)    Also with jitters   Motrin [Ibuprofen] Other (See Comments)    Is to not take this   Statins Other (See Comments)    Leg muscle weakness w/ rosuvastatin and simvastatin    Latex Rash   Losartan Potassium Other (See Comments)    Muscle aches

## 2024-06-22 ENCOUNTER — Inpatient Hospital Stay (HOSPITAL_COMMUNITY)

## 2024-06-22 DIAGNOSIS — I3139 Other pericardial effusion (noninflammatory): Secondary | ICD-10-CM | POA: Diagnosis not present

## 2024-06-22 DIAGNOSIS — J90 Pleural effusion, not elsewhere classified: Secondary | ICD-10-CM | POA: Diagnosis not present

## 2024-06-22 DIAGNOSIS — R0602 Shortness of breath: Secondary | ICD-10-CM | POA: Diagnosis not present

## 2024-06-22 LAB — GLUCOSE, CAPILLARY
Glucose-Capillary: 119 mg/dL — ABNORMAL HIGH (ref 70–99)
Glucose-Capillary: 106 mg/dL — ABNORMAL HIGH (ref 70–99)
Glucose-Capillary: 96 mg/dL (ref 70–99)

## 2024-06-22 LAB — BASIC METABOLIC PANEL WITH GFR
Anion gap: 8 (ref 5–15)
BUN: 20 mg/dL (ref 8–23)
CO2: 33 mmol/L — ABNORMAL HIGH (ref 22–32)
Calcium: 8.3 mg/dL — ABNORMAL LOW (ref 8.9–10.3)
Chloride: 95 mmol/L — ABNORMAL LOW (ref 98–111)
Creatinine, Ser: 0.82 mg/dL (ref 0.44–1.00)
GFR, Estimated: >60 mL/min
Glucose, Bld: 106 mg/dL — ABNORMAL HIGH (ref 70–99)
Potassium: 3.8 mmol/L (ref 3.5–5.1)
Sodium: 137 mmol/L (ref 135–145)

## 2024-06-22 LAB — CBC WITH DIFFERENTIAL/PLATELET
Abs Immature Granulocytes: 0.03 K/uL (ref 0.00–0.07)
Basophils Absolute: 0 K/uL (ref 0.0–0.1)
Basophils Relative: 0 %
Eosinophils Absolute: 0.1 K/uL (ref 0.0–0.5)
Eosinophils Relative: 1 %
HCT: 32.6 % — ABNORMAL LOW (ref 36.0–46.0)
Hemoglobin: 10.1 g/dL — ABNORMAL LOW (ref 12.0–15.0)
Immature Granulocytes: 0 %
Lymphocytes Relative: 9 %
Lymphs Abs: 0.9 K/uL (ref 0.7–4.0)
MCH: 28.9 pg (ref 26.0–34.0)
MCHC: 31 g/dL (ref 30.0–36.0)
MCV: 93.4 fL (ref 80.0–100.0)
Monocytes Absolute: 1 K/uL (ref 0.1–1.0)
Monocytes Relative: 10 %
Neutro Abs: 8 K/uL — ABNORMAL HIGH (ref 1.7–7.7)
Neutrophils Relative %: 80 %
Platelets: 156 K/uL (ref 150–400)
RBC: 3.49 MIL/uL — ABNORMAL LOW (ref 3.87–5.11)
RDW: 16.4 % — ABNORMAL HIGH (ref 11.5–15.5)
WBC: 10 K/uL (ref 4.0–10.5)
nRBC: 0.2 % (ref 0.0–0.2)

## 2024-06-22 LAB — PROTIME-INR
INR: 3.5 — ABNORMAL HIGH (ref 0.8–1.2)
Prothrombin Time: 36.6 s — ABNORMAL HIGH (ref 11.4–15.2)

## 2024-06-22 LAB — MAGNESIUM: Magnesium: 1.9 mg/dL (ref 1.7–2.4)

## 2024-06-22 LAB — CBG MONITORING, ED
Glucose-Capillary: 96 mg/dL (ref 70–99)
Glucose-Capillary: 107 mg/dL — ABNORMAL HIGH (ref 70–99)
Glucose-Capillary: 109 mg/dL — ABNORMAL HIGH (ref 70–99)

## 2024-06-22 MED ORDER — FUROSEMIDE 10 MG/ML IJ SOLN
40.0000 mg | Freq: Once | INTRAMUSCULAR | Status: AC
  Administered 2024-06-22: 40 mg via INTRAVENOUS
  Filled 2024-06-22: qty 4

## 2024-06-22 MED ORDER — IPRATROPIUM-ALBUTEROL 0.5-2.5 (3) MG/3ML IN SOLN
RESPIRATORY_TRACT | Status: AC
  Administered 2024-06-22: 3 mL
  Filled 2024-06-22: qty 3

## 2024-06-22 MED ORDER — SODIUM CHLORIDE 0.9 % IV SOLN
3.0000 g | Freq: Three times a day (TID) | INTRAVENOUS | Status: AC
  Administered 2024-06-22 – 2024-06-24 (×7): 3 g via INTRAVENOUS
  Filled 2024-06-22 (×7): qty 8

## 2024-06-22 MED ORDER — WARFARIN SODIUM 1 MG PO TABS
1.0000 mg | ORAL_TABLET | ORAL | Status: DC
  Filled 2024-06-22: qty 1

## 2024-06-22 MED ORDER — PANTOPRAZOLE SODIUM 40 MG IV SOLR
40.0000 mg | INTRAVENOUS | Status: DC
  Administered 2024-06-22 – 2024-06-24 (×3): 40 mg via INTRAVENOUS
  Filled 2024-06-22 (×3): qty 10

## 2024-06-22 MED ORDER — ALUM & MAG HYDROXIDE-SIMETH 200-200-20 MG/5ML PO SUSP
30.0000 mL | ORAL | Status: DC | PRN
  Administered 2024-06-22 – 2024-06-30 (×3): 30 mL via ORAL
  Filled 2024-06-22 (×3): qty 30

## 2024-06-22 MED ORDER — GADOBUTROL 1 MMOL/ML IV SOLN
10.0000 mL | Freq: Once | INTRAVENOUS | Status: AC | PRN
  Administered 2024-06-22: 10 mL via INTRAVENOUS

## 2024-06-22 MED ORDER — LORAZEPAM 2 MG/ML IJ SOLN
1.0000 mg | Freq: Once | INTRAMUSCULAR | Status: AC
  Administered 2024-06-22: 1 mg via INTRAVENOUS
  Filled 2024-06-22: qty 1

## 2024-06-22 NOTE — Progress Notes (Signed)
 Called respiratory to see pt around 2100-pt SOB and O2 hanging 86-88% on 4L. Patient placed on HFNC 10L-MD Paged

## 2024-06-22 NOTE — Progress Notes (Addendum)
 PHARMACY - ANTICOAGULATION CONSULT NOTE  Pharmacy Consult for warfarin Indication: atrial fibrillation  Allergies[1]  Patient Measurements: Height: 5' 4 (162.6 cm) Weight: 102.1 kg (225 lb) IBW/kg (Calculated) : 54.7 HEPARIN DW (KG): 78.5  Vital Signs: Temp: 97.5 F (36.4 C) (01/14 1016) Temp Source: Oral (01/14 1016) BP: 132/70 (01/14 1000) Pulse Rate: 76 (01/14 1000)  Labs: Recent Labs    06/20/24 0447 06/21/24 1237 06/22/24 0404  HGB  --  10.7* 10.1*  HCT  --  33.8* 32.6*  PLT  --  175 156  LABPROT 25.4* 28.7* 36.6*  INR 2.2* 2.6* 3.5*  CREATININE 1.01* 0.94 0.82    Estimated Creatinine Clearance: 58.4 mL/min (by C-G formula based on SCr of 0.82 mg/dL).   Medical History: Past Medical History:  Diagnosis Date   Anxiety    takes Lorazepam  daily as needed   Arthritis    Cancer (HCC)    thyroid    Cataracts, bilateral    immature   Chronic back pain    compressed vertebrae,takes Fosamax weekly   Family history of adverse reaction to anesthesia    granddaughter gets very sick and mean   History of blood transfusion    no abnormal reaction noted   History of bronchitis 05/2015   History of colon polyps    benign   Hyperlipidemia    takes Zetia  daily   Hypertension    takes Diltiazem  and Avapro  daily   Hypothyroidism    takes Synthroid  daily   Ischemic colitis    hx of-many yrs ago   Joint pain    Joint swelling    OSA on CPAP    05-08-12 AHi was 46, RDI  53. titrated to   9 cm water   with 3 cm flex.-nadir 75%   PE (pulmonary embolism)    takes Coumadin  daily   Peripheral edema    Pneumonia 1992   hx of   Sarcoidosis    Shortness of breath    Urinary frequency    Weakness    left leg.Numbness and tingling.Has sciatica   Wheeze    occaionally. Not new    Medications:  (Not in a hospital admission)   Assessment: [name] is a 84 y.o. year old female presented on 06/21/2024 with concern for shob found to have aspiration  pneumonia. On warfarin prior to admission for afib hx of PE. Prior to admission regimen, 5mg  PO daily. Last dose prior to admission 1/12 2200. Pharmacy consulted for warfarin inpatient management.   Date INR Warfarin Dose  1/12 2.2, therapeutic  5mg  (pta)   1/13 2.6, therapeutic  5mg    1/14 3.5, supra-therapeutic     New drug-drug interactions: unasyn  (might contribute to increase in INR) Bleeding assessment: CBC stable, no bleeding noted  Of note, pt currently NPO with aspiration precautions   Goal of Therapy:  INR 2-3 Monitor platelets by anticoagulation protocol: Yes   Plan:  Hold warfarin today  Daily PT/INR and CBC Assess for signs/sx bleeding and drug-drug interactions  Thank you for allowing pharmacy to participate in this patient's care.  Leonor GORMAN Bash, PharmD Emergency Medicine Clinical Pharmacist 06/22/2024,11:08 AM         [1]  Allergies Allergen Reactions   Atenolol Other (See Comments)    Depression    Citalopram Hydrobromide Nausea Only and Other (See Comments)    Nausea/Fatigue   Clindamycin /Lincomycin Other (See Comments)    Via IV, has caused a metallic taste in the mouth  Codeine Other (See Comments)    Caused Hyperactivity and Dysphoria   Coenzyme Q10 Itching   Erythromycin Itching, Anxiety and Other (See Comments)    Also with jitters   Motrin [Ibuprofen] Other (See Comments)    Is to not take this   Statins Other (See Comments)    Leg muscle weakness w/ rosuvastatin and simvastatin    Latex Rash   Losartan Potassium Other (See Comments)    Muscle aches

## 2024-06-22 NOTE — ED Notes (Signed)
 Pt returned from MRI

## 2024-06-22 NOTE — Progress Notes (Signed)
 " PROGRESS NOTE    [name]  FMW:[id number] DOB: 03-Mar-1941 DOA: 06/21/2024 PCP: [name], MD   Brief Narrative:  [name] is a 84 y.o. female with past medical history  of   thyroid  cancer, hyperlipidemia, hypertension, hypothyroid, anxiety, arthritis, OSA on CPAP, and history of sarcoidosis Most recently she was admitted from June 10, 2024 with shock, Staphylococcus epidermidis bacteremia, acute kidney injury, hyperkalemia, and moderate pericardial effusion and discharged June 20, 2024, presented back to ED with shortness of breath.  During recent hospitalization, She was initially admitted to the ICU ,received fresh frozen plasma, vitamin K , lokelma , bicarbonate infusion, and empiric antibiotics. Echocardiography revealed an ejection fraction of 45-50% with a large pericardial effusion without tamponade. Pt had S. epidermidis bacteremia / completed 7 days of IV linezolid .  Patient was seen by cardiology on January 7 for her pericardial effusion with no tamponade on echo with plan for recheck echocardiogram.  Patient was discharged on 5 L of oxygen .  Assessment & Plan:   Principal Problem:   SOB (shortness of breath) Active Problems:   Respiratory distress   Acute on chronic hypoxic respiratory failure (HCC)   Acute on chronic diastolic (congestive) heart failure (HCC)  Acute on chronic hypoxic respiratory failure, likely secondary to moderate pericardial effusion, POA: Seen by cardiology, limited echo was obtained which shows moderate pericardial effusion which is slightly more prominent since the echo done on 06/14/2024, no tamponade.  Seen by cardiology, they have started her on Lasix  IV 40 twice daily.  Plan for right and left heart cath at some point in time.  Appreciate cardiology managing.  Possible aspiration pneumonia/hospital-acquired pneumonia: Some infiltrates noted on the left lower lobe consistent with possible aspiration pneumonia.  Recent  hospitalization for more than 3 days for that reason she meets criteria for healthcare associated pneumonia.  Patient has been started on cefepime  and vancomycin .  I am not concerned about MRSA infection.  No previous history of MRSA infection either.  Will discontinue those antibiotics and start on Unasyn  only.  Permanent atrial fibrillation/history of PE: Rate controlled.  Continue Coreg  and Cardizem .  On Coumadin , INR slightly supratherapeutic.  Pharmacy managing Coumadin  dose.  Hypertension: Blood pressure controlled.  Holding home dose of Avapro .  GERD: Start on PPI.  Type 2 diabetes mellitus: Recent hemoglobin A1c 7.6.  Not on any medications PTA.  Currently on SSI.  Acquired hypothyroidism: Continue Synthroid .  OSA: Nightly CPAP.  Anemia of chronic disease: Stable.  Hyponatremia: Resolved.  Lactic acidosis: Resolved.  DVT prophylaxis: Coumadin    Code Status: Full Code  Family Communication: Husband present at bedside.  Plan of care discussed with patient in length and he/she verbalized understanding and agreed with it.  Status is: Inpatient Remains inpatient appropriate because: Still symptomatic.   Estimated body mass index is 38.62 kg/m as calculated from the following:   Height as of this encounter: 5' 4 (1.626 m).   Weight as of this encounter: 102.1 kg.    Nutritional Assessment: Body mass index is 38.62 kg/m.SABRA Seen by dietician.  I agree with the assessment and plan as outlined below: Nutrition Status:        . Skin Assessment: I have examined the patient's skin and I agree with the wound assessment as performed by the wound care RN as outlined below:    Consultants:  Cardiology  Procedures:  As above  Antimicrobials:  Anti-infectives (From admission, onward)    Start     Dose/Rate Route Frequency  Ordered Stop   06/22/24 1400  vancomycin  (VANCOCIN ) IVPB 1000 mg/200 mL premix        1,000 mg 200 mL/hr over 60 Minutes Intravenous Every 24 hours  06/21/24 1631     06/21/24 2200  ceFEPIme  (MAXIPIME ) 2 g in sodium chloride  0.9 % 100 mL IVPB        2 g 200 mL/hr over 30 Minutes Intravenous Every 8 hours 06/21/24 1624     06/21/24 1630  vancomycin  (VANCOREADY) IVPB 2000 mg/400 mL  Status:  Discontinued        2,000 mg 200 mL/hr over 120 Minutes Intravenous  Once 06/21/24 1624 06/21/24 1626   06/21/24 1345  vancomycin  (VANCOCIN ) IVPB 1000 mg/200 mL premix  Status:  Discontinued        1,000 mg 200 mL/hr over 60 Minutes Intravenous  Once 06/21/24 1336 06/21/24 1338   06/21/24 1345  ceFEPIme  (MAXIPIME ) 2 g in sodium chloride  0.9 % 100 mL IVPB        2 g 200 mL/hr over 30 Minutes Intravenous  Once 06/21/24 1336 06/21/24 1428   06/21/24 1345  vancomycin  (VANCOREADY) IVPB 1750 mg/350 mL        1,750 mg 175 mL/hr over 120 Minutes Intravenous  Once 06/21/24 1338 06/21/24 1607         Subjective: Patient seen and examined, laying in the bed.  Husband at bedside.  Patient states that her breathing is slightly better but she has not been out of the bed so she really cannot answer that question yet.  She further told me that she could not sleep well last night due to having heartburn.  No other complaint.  Objective: Vitals:   06/22/24 0500 06/22/24 0600 06/22/24 0700 06/22/24 0734  BP: 131/77 (!) 146/76 (!) 142/84   Pulse: 91 79 93   Resp: (!) 23 (!) 21 (!) 21   Temp:      TempSrc:      SpO2: 93% 94% 93% 92%  Weight:      Height:        Intake/Output Summary (Last 24 hours) at 06/22/2024 0818 Last data filed at 06/21/2024 1428 Gross per 24 hour  Intake 100 ml  Output --  Net 100 ml   Filed Weights   06/21/24 2125  Weight: 102.1 kg    Examination:  General exam: Appears calm and comfortable, obese Respiratory system: Diminished breath sounds bilaterally but more so on the left middle and lower lobe.  No rhonchi or crackles.  Respiratory effort normal. Cardiovascular system: S1 & S2 heard, RRR. No JVD, murmurs, rubs, gallops  or clicks. No pedal edema. Gastrointestinal system: Abdomen is nondistended, soft and nontender. No organomegaly or masses felt. Normal bowel sounds heard. Central nervous system: Alert and oriented. No focal neurological deficits. Extremities: Symmetric 5 x 5 power. Skin: No rashes, lesions or ulcers Psychiatry: Judgement and insight appear normal. Mood & affect appropriate.    Data Reviewed: I have personally reviewed following labs and imaging studies  CBC: Recent Labs  Lab 06/16/24 0612 06/18/24 0529 06/19/24 0514 06/21/24 1237 06/22/24 0404  WBC 9.1 8.3 7.9 10.9* 10.0  NEUTROABS  --  5.7 5.8 9.1* 8.0*  HGB 10.5* 10.3* 10.8* 10.7* 10.1*  HCT 32.6* 31.4* 32.6* 33.8* 32.6*  MCV 91.1 91.0 91.1 93.1 93.4  PLT 182 227 217 175 156   Basic Metabolic Panel: Recent Labs  Lab 06/17/24 0556 06/18/24 0529 06/19/24 0514 06/20/24 0447 06/21/24 1237 06/22/24 0404  NA 127* 126*  126* 128* 133* 137  K 4.5 4.4 4.3 4.1 4.2 3.8  CL 91* 90* 89* 90* 93* 95*  CO2 26 28 28 29  32 33*  GLUCOSE 86 103* 106* 87 134* 106*  BUN 30* 29* 27* 25* 22 20  CREATININE 1.22* 1.30* 1.23* 1.01* 0.94 0.82  CALCIUM  8.8* 8.7* 8.7* 8.5* 8.3* 8.3*  MG 2.5* 2.4 2.1 1.9  --  1.9   GFR: Estimated Creatinine Clearance: 58.4 mL/min (by C-G formula based on SCr of 0.82 mg/dL). Liver Function Tests: Recent Labs  Lab 06/16/24 0612 06/21/24 1237  AST 26 26  ALT 17 16  ALKPHOS 140* 154*  BILITOT 0.8 0.6  PROT 6.9 7.0  ALBUMIN 2.6* 2.8*   No results for input(s): LIPASE, AMYLASE in the last 168 hours. No results for input(s): AMMONIA in the last 168 hours. Coagulation Profile: Recent Labs  Lab 06/18/24 0529 06/19/24 0514 06/20/24 0447 06/21/24 1237 06/22/24 0404  INR 1.5* 1.9* 2.2* 2.6* 3.5*   Cardiac Enzymes: No results for input(s): CKTOTAL, CKMB, CKMBINDEX, TROPONINI in the last 168 hours. BNP (last 3 results) Recent Labs    05/12/24 1806 06/10/24 0824 06/21/24 1237  PROBNP  1,710.0* 6,454.0* 4,067.0*   HbA1C: No results for input(s): HGBA1C in the last 72 hours. CBG: Recent Labs  Lab 06/21/24 1636 06/21/24 2017 06/21/24 2328 06/22/24 0402 06/22/24 0734  GLUCAP 120* 116* 111* 96 107*   Lipid Profile: No results for input(s): CHOL, HDL, LDLCALC, TRIG, CHOLHDL, LDLDIRECT in the last 72 hours. Thyroid  Function Tests: No results for input(s): TSH, T4TOTAL, FREET4, T3FREE, THYROIDAB in the last 72 hours. Anemia Panel: No results for input(s): VITAMINB12, FOLATE, FERRITIN, TIBC, IRON, RETICCTPCT in the last 72 hours. Sepsis Labs: Recent Labs  Lab 06/21/24 1501 06/21/24 2223  LATICACIDVEN 2.7* 1.8    Recent Results (from the past 240 hours)  Culture, blood (Routine X 2) w Reflex to ID Panel     Status: None   Collection Time: 06/16/24  2:22 PM   Specimen: BLOOD LEFT HAND  Result Value Ref Range Status   Specimen Description   Final    BLOOD LEFT HAND Performed at [facility], [address], [place], [place] [address]    Special Requests   Final    BOTTLES DRAWN AEROBIC AND ANAEROBIC Blood Culture adequate volume Performed at [facility], [address], [place], [place] [address]    Culture   Final    NO GROWTH 5 DAYS Performed at [facility], [address], [place], [place] [address]    Report Status 06/21/2024 FINAL  Final  Culture, blood (Routine X 2) w Reflex to ID Panel     Status: None   Collection Time: 06/16/24  2:27 PM   Specimen: BLOOD LEFT HAND  Result Value Ref Range Status   Specimen Description   Final    BLOOD LEFT HAND Performed at [facility], [address], [place], [place] [address]    Special Requests   Final    BOTTLES DRAWN AEROBIC AND ANAEROBIC Blood Culture adequate volume Performed at [facility], [address], [place], [place] [address]    Culture   Final    NO GROWTH 5 DAYS Performed at [facility]  [facility], [address] [place], [place] [address]    Report Status 06/21/2024 FINAL  Final  Resp panel by RT-PCR (RSV, Flu A&B, Covid) Anterior Nasal Swab     Status:  None   Collection Time: 06/21/24 12:26 PM   Specimen: Anterior Nasal Swab  Result Value Ref Range Status   SARS Coronavirus 2 by RT PCR NEGATIVE NEGATIVE Final   Influenza A by PCR NEGATIVE NEGATIVE Final   Influenza B by PCR NEGATIVE NEGATIVE Final    Comment: (NOTE) The Xpert Xpress SARS-CoV-2/FLU/RSV plus assay is intended as an aid in the diagnosis of influenza from Nasopharyngeal swab specimens and should not be used as a sole basis for treatment. Nasal washings and aspirates are unacceptable for Xpert Xpress SARS-CoV-2/FLU/RSV testing.  Fact Sheet for Patients: bloggercourse.com  Fact Sheet for Healthcare Providers: seriousbroker.it  This test is not yet approved or cleared by the [place]  FDA and has been authorized for detection and/or diagnosis of SARS-CoV-2 by FDA under an Emergency Use Authorization (EUA). This EUA will remain in effect (meaning this test can be used) for the duration of the COVID-19 declaration under Section 564(b)(1) of the Act, 21 U.S.C. section 360bbb-3(b)(1), unless the authorization is terminated or revoked.     Resp Syncytial Virus by PCR NEGATIVE NEGATIVE Final    Comment: (NOTE) Fact Sheet for Patients: bloggercourse.com  Fact Sheet for Healthcare Providers: seriousbroker.it  This test is not yet approved or cleared by the [place]  FDA and has been authorized for detection and/or diagnosis of SARS-CoV-2 by FDA under an Emergency Use Authorization (EUA). This EUA will remain in effect (meaning this test can be used) for the duration of the COVID-19 declaration under Section 564(b)(1) of the Act, 21 U.S.C. section 360bbb-3(b)(1), unless the authorization is terminated  or revoked.  Performed at [facility], [address], [place], [place] [address]      Radiology Studies: ECHOCARDIOGRAM LIMITED Result Date: 06/21/2024    ECHOCARDIOGRAM LIMITED REPORT   Patient Name:   [name] Kroner Date of Exam: 06/21/2024 Medical Rec #:  [id number]            Height:       64.0 in Accession #:    [id number]           Weight:       239.6 lb Date of Birth:  [birth year]            BSA:          2.113 m Patient Age:    83 years             BP:           144/74 mmHg Patient Gender: F                    HR:           94 bpm. Exam Location:  Inpatient Procedure: Limited Echo, Cardiac Doppler and Color Doppler (Both Spectral and            Color Flow Doppler were utilized during procedure). Indications:     Pericardial Effusion I31.3  History:         Patient has prior history of Echocardiogram examinations, most                  recent 06/14/2024. Pericardial Effusion; Risk Factors:Sleep Apnea                  and Hypertension.  Sonographer:     Koleen Popper RDCS Referring Phys:  [id number] KEVEN FILA Diagnosing Phys: Darryle Decent MD  Sonographer Comments: Image acquisition challenging due to patient body habitus. IMPRESSIONS  1. Moderate pericardial effusion which is more prominent up to 2.0 cm posterior to the LV (best seen subcostal; image 49). The RV is poorly visualized, but does not appear to collapse in diastole. The IVC is dilated. There is evidence of respiratory variation in septal movement on image 41. Findings are suggestive of a clinically significant pericardial effusion. Clinical correlation is recommended. Appearance is stable compared to prior study. Moderate pericardial effusion. The pericardial effusion  is circumferential.  2. Right ventricular systolic function was not well visualized. The right ventricular size is not well visualized. Tricuspid regurgitation signal is inadequate for assessing PA pressure.  3. The mitral valve is grossly normal. Trivial  mitral valve regurgitation. No evidence of mitral stenosis.  4. The aortic valve is tricuspid. Aortic valve regurgitation is not visualized. Aortic valve sclerosis is present, with no evidence of aortic valve stenosis.  5. The inferior vena cava is dilated in size with <50% respiratory variability, suggesting right atrial pressure of 15 mmHg.  6. Left ventricular ejection fraction, by estimation, is 60 to 65%. The left ventricle has normal function. Left ventricular endocardial border not optimally defined to evaluate regional wall motion. Comparison(s): No significant change from prior study. FINDINGS  Left Ventricle: Left ventricular ejection fraction, by estimation, is 60 to 65%. The left ventricle has normal function. Left ventricular endocardial border not optimally defined to evaluate regional wall motion. The left ventricular internal cavity size was normal in size. There is no left ventricular hypertrophy. Right Ventricle: The right ventricular size is not well visualized. Right vetricular wall thickness was not well visualized. Right ventricular systolic function was not well visualized. Tricuspid regurgitation signal is inadequate for assessing PA pressure. Left Atrium: Left atrial size was normal in size. Right Atrium: Right atrial size was normal in size. Pericardium: Moderate pericardial effusion which is more prominent up to 2.0 cm posterior to the LV (best seen subcostal; image 49). The RV is poorly visualized, but does not appear to collapse in diastole. The IVC is dilated. There is evidence of respiratory variation in septal movement on image 41. Findings are suggestive of a clinically significant pericardial effusion. Clinical correlation is recommended. Appearance is stable compared to prior study. A moderately sized pericardial effusion is present. The pericardial effusion is circumferential. Presence of epicardial fat layer. Mitral Valve: The mitral valve is grossly normal. Trivial mitral valve  regurgitation. No evidence of mitral valve stenosis. Aortic Valve: The aortic valve is tricuspid. Aortic valve regurgitation is not visualized. Aortic valve sclerosis is present, with no evidence of aortic valve stenosis. Pulmonic Valve: The pulmonic valve was grossly normal. Pulmonic valve regurgitation is trivial. No evidence of pulmonic stenosis. Aorta: The aortic root and ascending aorta are structurally normal, with no evidence of dilitation. Venous: The inferior vena cava is dilated in size with less than 50% respiratory variability, suggesting right atrial pressure of 15 mmHg. IAS/Shunts: The atrial septum is grossly normal. Additional Comments: Spectral Doppler performed. Color Doppler performed.  LEFT VENTRICLE PLAX 2D LVIDd:         4.60 cm LVIDs:         3.30 cm LV PW:         0.90 cm LV IVS:        1.10 cm LVOT diam:     2.00 cm LV SV:         58 LV SV Index:   28 LVOT Area:     3.14 cm  LV Volumes (MOD) LV vol d, MOD  A4C: 122.0 ml LV vol s, MOD A4C: 44.8 ml LV SV MOD A4C:     122.0 ml IVC IVC diam: 2.40 cm LEFT ATRIUM           Index LA diam:      3.90 cm 1.85 cm/m LA Vol (A4C): 52.0 ml 24.61 ml/m  AORTIC VALVE LVOT Vmax:   103.00 cm/s LVOT Vmean:  67.600 cm/s LVOT VTI:    0.185 m  AORTA Ao Root diam: 3.10 cm Ao Asc diam:  3.30 cm  SHUNTS Systemic VTI:  0.18 m Systemic Diam: 2.00 cm Darryle Decent MD Electronically signed by Darryle Decent MD Signature Date/Time: 06/21/2024/6:21:03 PM    Final (Updated)    DG Chest Port 1 View Result Date: 06/21/2024 CLINICAL DATA:  Shortness of breath. EXAM: PORTABLE CHEST 1 VIEW COMPARISON:  06/10/2024, CT 06/10/2024 FINDINGS: Lungs are adequately inflated demonstrate moderate opacification over the lower half of the left hemithorax likely large effusion with associated basilar compressive atelectasis. Mild hazy density over the right base possibly small layering effusion with atelectasis. Mild hazy opacification of the perihilar markings suggesting mild degree of  vascular congestion. Multiple calcified hilar/mediastinal lymph nodes. Stable cardiomegaly. Remainder of the exam is unchanged. IMPRESSION: 1. Moderate opacification over the lower half of the left hemithorax likely large effusion with associated basilar compressive atelectasis. Possible small layering right effusion with atelectasis. 2. Stable cardiomegaly with suggestion of mild vascular congestion. Electronically Signed   By: Toribio Agreste M.D.   On: 06/21/2024 13:09    Scheduled Meds:  carvedilol   3.125 mg Oral BID WC   diltiazem   120 mg Oral Q8H   furosemide   40 mg Intravenous BID   insulin  aspart  0-9 Units Subcutaneous Q4H   levothyroxine   112 mcg Oral q morning   sodium chloride  flush  3 mL Intravenous Q12H   Warfarin - Pharmacist Dosing Inpatient   Does not apply q1600   Continuous Infusions:  ceFEPime  (MAXIPIME ) IV Stopped (06/22/24 0714)   vancomycin        LOS: 1 day   Fredia Skeeter, MD [facility]  06/22/2024, 8:18 AM   *Please note that this is a verbal dictation therefore any spelling or grammatical errors are due to the Dragon Medical One system interpretation.  Please page via Amion and do not message via secure chat for urgent patient care matters. Secure chat can be used for non urgent patient care matters.  How to contact the [facility] Attending or Consulting provider 7A - 7P or covering provider during after hours 7P -7A, for this patient?  Check the care team in [facility] and look for a) attending/consulting [facility] provider listed and b) the [facility] team listed. Page or secure chat 7A-7P. Log into [email or web address] and use [place]'s universal password to access. If you do not have the password, please contact the hospital operator. Locate the [facility] provider you are looking for under [facility] and page to a number that you can be directly reached. If you still have difficulty reaching the provider, please page the [facility] (Director on Call) for the Hospitalists listed on  amion for assistance.  "

## 2024-06-22 NOTE — Progress Notes (Signed)
" ° °  Brief Progress Note   _____________________________________________________________________________________________________________  Patient Name: [name] Patient DOB: 1941/04/19 Date: @TODAY @      Data: 84 y.o. female currently awaiting admission to a Progressive bed at [facility].      Action: I reached out to Dr. Vernon to inquire about the possibility of downgrading patient to a telemetry or med-surg level of care.     Response: Dr. Vernon will update the patient's level of care to reflect telemetry status.    _____________________________________________________________________________________________________________  [facility] RN Expeditor Jaryan Chicoine S Schneider [place] Please contact us  directly via secure chat (search for [facility]) or by calling us  at [phone] [facility]).  "

## 2024-06-22 NOTE — ED Notes (Signed)
 Dr. Vernon made aware of absent breath sounds on left side.

## 2024-06-22 NOTE — ED Notes (Signed)
 Patient transported to MRI

## 2024-06-22 NOTE — Evaluation (Addendum)
 Physical Therapy Evaluation Patient Details Name: MONICIA TSE MRN: [id number] DOB: 1940-06-25 Today's Date: 06/22/2024  History of Present Illness  Pt is 84 year old presented to [facility] on  06/21/24 for SOB. Was just discharged from [facility] to [facility] on 06/20/24. At [facility] pt with bacteremia and pericardial effusion. Pt now with acute hypoxic respiratory failure, pleural effusion, and acute on chronic CHF.  PMH - CHF, HTN, arthritis, anxiety, thyroid  CA, afib, PE, DM, OSA  Clinical Impression  Pt admitted with above diagnosis and presents to PT with functional limitations due to deficits listed below (See PT problem list). Pt needs skilled PT to maximize independence and safety. Pt with return to hospital from SNF. Prior to recent hospitalization pt was ambulatory with walker. Expect she should make steady progress if medical status improves. Patient will benefit from continued inpatient follow up therapy, <3 hours/day.           If plan is discharge home, recommend the following: A little help with walking and/or transfers;A little help with bathing/dressing/bathroom   Can travel by private vehicle   No    Equipment Recommendations None recommended by PT  Recommendations for Other Services       Functional Status Assessment Patient has had a recent decline in their functional status and demonstrates the ability to make significant improvements in function in a reasonable and predictable amount of time.     Precautions / Restrictions Precautions Precautions: Fall;Other (comment) Recall of Precautions/Restrictions: Intact Precaution/Restrictions Comments: monitor O2 Restrictions Weight Bearing Restrictions Per Provider Order: No      Mobility  Bed Mobility Overal bed mobility: Needs Assistance Bed Mobility: Supine to Sit, Sit to Supine     Supine to sit: Contact guard, HOB elevated, Used rails Sit to supine: Contact guard assist        Transfers                    General transfer comment: Did not attempt due to height of stretcher pt was on in ED    Ambulation/Gait                  Stairs            Wheelchair Mobility     Tilt Bed    Modified Rankin (Stroke Patients Only)       Balance Overall balance assessment: Needs assistance Sitting-balance support: No upper extremity supported, Feet unsupported Sitting balance-Leahy Scale: Fair                                       Pertinent Vitals/Pain Pain Assessment Pain Assessment: No/denies pain    Home Living Family/patient expects to be discharged to:: Skilled nursing facility Living Arrangements: Spouse/significant other Available Help at Discharge: Family;Available 24 hours/day Type of Home: House Home Access: Stairs to enter Entrance Stairs-Rails: Doctor, General Practice of Steps: 5   Home Layout: One level Home Equipment: BSC/3in1;Wheelchair - Biomedical Scientist (2 wheels)      Prior Function Prior Level of Function : Needs assist       Physical Assist : Mobility (physical);ADLs (physical) Mobility (physical): Bed mobility;Transfers;Gait ADLs (physical): Bathing;Dressing;Toileting Mobility Comments: Assist for mobility since recent hospitalization. Prior to that hospitalization she was Mod I with rolling walker ADLs Comments: Assist with recent hospitalization     Extremity/Trunk Assessment   Upper Extremity Assessment Upper Extremity  Assessment: Defer to OT evaluation    Lower Extremity Assessment Lower Extremity Assessment: Generalized weakness       Communication   Communication Communication: Impaired Factors Affecting Communication: Hearing impaired    Cognition Arousal: Alert Behavior During Therapy: WFL for tasks assessed/performed   PT - Cognitive impairments: No apparent impairments                         Following commands: Intact       Cueing Cueing Techniques:  Verbal cues     General Comments General comments (skin integrity, edema, etc.): Pt on 3L O2 with SpO2 88-92%    Exercises     Assessment/Plan    PT Assessment Patient needs continued PT services  PT Problem List Decreased strength;Decreased activity tolerance;Decreased balance;Decreased mobility       PT Treatment Interventions DME instruction;Gait training;Functional mobility training;Therapeutic activities;Therapeutic exercise;Balance training;Patient/family education    PT Goals (Current goals can be found in the Care Plan section)  Acute Rehab PT Goals Patient Stated Goal: return home PT Goal Formulation: With patient/family Time For Goal Achievement: 07/06/24 Potential to Achieve Goals: Good    Frequency Min 2X/week     Co-evaluation               AM-PAC PT 6 Clicks Mobility  Outcome Measure Help needed turning from your back to your side while in a flat bed without using bedrails?: A Little Help needed moving from lying on your back to sitting on the side of a flat bed without using bedrails?: A Little Help needed moving to and from a bed to a chair (including a wheelchair)?: A Little Help needed standing up from a chair using your arms (e.g., wheelchair or bedside chair)?: A Little Help needed to walk in hospital room?: Total Help needed climbing 3-5 steps with a railing? : Total 6 Click Score: 14    End of Session Equipment Utilized During Treatment: Oxygen  Activity Tolerance: Other (comment) (height of stretcher and respiratory status) Patient left: in bed;with call bell/phone within reach;with family/visitor present (on stretcher)   PT Visit Diagnosis: Unsteadiness on feet (R26.81);Other abnormalities of gait and mobility (R26.89);Muscle weakness (generalized) (M62.81)    Time: 9072-9052 PT Time Calculation (min) (ACUTE ONLY): 20 min   Charges:   PT Evaluation $PT Eval Moderate Complexity: 1 Mod PT Treatments $Therapeutic Activity: 8-22  mins PT General Charges $$ ACUTE PT VISIT: 1 Visit         [facility] PT Acute Rehabilitation Services Office [phone]   Rodgers ORN [facility] 06/22/2024, 1:04 PM

## 2024-06-22 NOTE — Progress Notes (Addendum)
 "   Patient Name: [name] Date of Encounter: 06/22/2024 [facility] Cardiologist: Georganna Archer, MD   Interval Summary  .    Breathing somewhat improved  Vital Signs .    Vitals:   06/22/24 0500 06/22/24 0600 06/22/24 0700 06/22/24 0734  BP: 131/77 (!) 146/76 (!) 142/84   Pulse: 91 79 93   Resp: (!) 23 (!) 21 (!) 21   Temp:      TempSrc:      SpO2: 93% 94% 93% 92%  Weight:      Height:        Intake/Output Summary (Last 24 hours) at 06/22/2024 0914 Last data filed at 06/21/2024 1428 Gross per 24 hour  Intake 100 ml  Output --  Net 100 ml      06/21/2024    9:25 PM 06/20/2024    4:45 AM 06/19/2024    5:00 AM  Last 3 Weights  Weight (lbs) 225 lb 239 lb 10.2 oz 240 lb 8.4 oz  Weight (kg) 102.059 kg 108.7 kg 109.1 kg      Telemetry/ECG    Telemetry 06/22/2024- Personally Reviewed Rate controlled A-fib  Echocardiogram 06/21/2024: 1. Moderate pericardial effusion which is more prominent up to 2.0 cm  posterior to the LV (best seen subcostal; image 49). The RV is poorly  visualized, but does not appear to collapse in diastole. The IVC is  dilated. There is evidence of respiratory  variation in septal movement on image 41. Findings are suggestive of a  clinically significant pericardial effusion. Clinical correlation is  recommended. Appearance is stable compared to prior study. Moderate  pericardial effusion. The pericardial effusion   is circumferential.   2. Right ventricular systolic function was not well visualized. The right  ventricular size is not well visualized. Tricuspid regurgitation signal is  inadequate for assessing PA pressure.   3. The mitral valve is grossly normal. Trivial mitral valve  regurgitation. No evidence of mitral stenosis.   4. The aortic valve is tricuspid. Aortic valve regurgitation is not  visualized. Aortic valve sclerosis is present, with no evidence of aortic  valve stenosis.   5. The inferior vena cava is  dilated in size with <50% respiratory  variability, suggesting right atrial pressure of 15 mmHg.   6. Left ventricular ejection fraction, by estimation, is 60 to 65%. The  left ventricle has normal function. Left ventricular endocardial border  not optimally defined to evaluate regional wall motion.   Comparison(s): No significant change from prior study.   Physical Exam .   Physical Exam Vitals and nursing note reviewed.  Constitutional:      General: She is not in acute distress. Neck:     Vascular: No JVD.  Cardiovascular:     Rate and Rhythm: Normal rate and regular rhythm.     Heart sounds: Normal heart sounds. No murmur heard. Pulmonary:     Effort: Pulmonary effort is normal.     Breath sounds: Decreased breath sounds present. No wheezing or rales.     Comments: Rales resolved Musculoskeletal:     Right lower leg: Edema (Trace) present.     Left lower leg: Edema (Trace) present.      Assessment & Plan .     84 year old female with hypertension, hyperlipidemia, diabetes mellitus, sarcoidosis, OSA, DVT/PE, longstanding persistent A-fib (on warfarin), recent diagnosis of moderate to large pericardial effusion in the setting of uremia with subsequent resolution of uremia and improvement in pericardial effusion, discharged to SNF  on 06/20/2024, readmitted with worsening shortness of breath and oxygen  requirement.   Acute on chronic hypoxic respiratory failure: In the setting of known moderate pericardial effusion, also with presentation consistent with acute on chronic HFpEF. While pericardial fusion is not large in size, I am concerned with possible constrictive effusive pericarditis physiology with dilated IVC, elevated JVD, bilateral rales and patient's clinical presentation. Clinical improvement with IV diuresis, continue Lasix  40 mg twice daily for now. Will obtain cardiac MRI today to evaluate for effusive constrictive pericarditis. Recommendations for possible aspiration  pneumonia as per primary team.  Permanent atrial fibrillation: Rate controlled.  On warfarin, INR 3.5 today, pharmacy to dose.  We will continue to follow the patient with you.  For questions or updates, please contact [facility] Please consult [email or web address] for contact info under        Signed, Newman JINNY Lawrence, MD   "

## 2024-06-22 NOTE — ED Notes (Signed)
 Pt states she was dx with thrush at her facility. Pt requesting anxiety, thrush, and acid reflux medication. RN notified Dr. Vernon

## 2024-06-22 NOTE — Evaluation (Signed)
 Occupational Therapy Evaluation Patient Details Name: [name] MRN: [id number] DOB: 03/23/1941 Today's Date: 06/22/2024   History of Present Illness   Pt is 84 year old presented to [facility] on  06/21/24 for SOB. Was just discharged from [facility] to [facility] on 06/20/24. At [facility] pt with bacteremia and pericardial effusion. Pt now with acute hypoxic respiratory failure, pleural effusion, and acute on chronic CHF.  PMH - CHF, HTN, arthritis, anxiety, thyroid  CA, afib, PE, DM, OSA     Clinical Impressions Pt presents to [facility] from SNF at which she was receiving assistance for functional mobility as well as ADLs. Pt currently requires up to Min A for functional transfers and up to Max A for ADL task engagement. Pt is primarily limited by decreased activity tolerance with increased respiratory needs, unsteadiness on feet, and decreased knowledge of DME/AE and energy conservation strategies. OT to continue to follow Pt acutely to facilitate progress towards goals. Patient will benefit from continued inpatient follow up therapy, <3 hours/day.      If plan is discharge home, recommend the following:   A little help with walking and/or transfers;A lot of help with bathing/dressing/bathroom;Assistance with cooking/housework;Direct supervision/assist for medications management;Assist for transportation;Help with stairs or ramp for entrance     Functional Status Assessment   Patient has had a recent decline in their functional status and demonstrates the ability to make significant improvements in function in a reasonable and predictable amount of time.     Equipment Recommendations   Other (comment) (defer)     Recommendations for Other Services         Precautions/Restrictions   Precautions Precautions: Fall;Other (comment) Recall of Precautions/Restrictions: Intact Precaution/Restrictions Comments: monitor O2 Restrictions Weight Bearing Restrictions Per Provider Order:  No     Mobility Bed Mobility Overal bed mobility: Needs Assistance Bed Mobility: Supine to Sit, Sit to Supine     Supine to sit: Supervision, HOB elevated Sit to supine: Min assist   General bed mobility comments: Supervision to come to EOB and use of bed rails. HOB slightly elevated at Pt request for return to bed. Min A to return to supine for repositioning of BLE.    Transfers Overall transfer level: Needs assistance Equipment used: Rolling walker (2 wheels) Transfers: Sit to/from Stand Sit to Stand: Contact guard assist, From elevated surface           General transfer comment: CGA from bed slightly elevated with RW. Initial verbal cues for proper hand placement on RW. Able to take three steps to R with CGA.      Balance Overall balance assessment: Needs assistance Sitting-balance support: No upper extremity supported, Feet supported Sitting balance-Leahy Scale: Fair     Standing balance support: Bilateral upper extremity supported, During functional activity, Reliant on assistive device for balance Standing balance-Leahy Scale: Poor Standing balance comment: Dependent on RW                           ADL either performed or assessed with clinical judgement   ADL Overall ADL's : Needs assistance/impaired Eating/Feeding: Independent;Sitting   Grooming: Set up;Sitting   Upper Body Bathing: Minimal assistance;Sitting   Lower Body Bathing: Moderate assistance;Sitting/lateral leans   Upper Body Dressing : Minimal assistance;Sitting   Lower Body Dressing: Maximal assistance;Sitting/lateral leans   Toilet Transfer: Contact guard assist;Stand-pivot;BSC/3in1;Rolling walker (2 wheels)   Toileting- Clothing Manipulation and Hygiene: Total assistance  Vision Patient Visual Report: No change from baseline       Perception         Praxis         Pertinent Vitals/Pain Pain Assessment Pain Assessment: No/denies pain      Extremity/Trunk Assessment Upper Extremity Assessment Upper Extremity Assessment: Overall WFL for tasks assessed   Lower Extremity Assessment Lower Extremity Assessment: Defer to PT evaluation       Communication Communication Communication: Impaired Factors Affecting Communication: Hearing impaired   Cognition Arousal: Alert Behavior During Therapy: WFL for tasks assessed/performed Cognition: No apparent impairments                               Following commands: Intact       Cueing  General Comments   Cueing Techniques: Verbal cues  Pt on 4L O2 with SpO2 88-95%   Exercises     Shoulder Instructions      Home Living Family/patient expects to be discharged to:: Skilled nursing facility Living Arrangements: Spouse/significant other Available Help at Discharge: Family;Available 24 hours/day Type of Home: House Home Access: Stairs to enter [employer] of Steps: 5 Entrance Stairs-Rails: Right;Left Home Layout: One level     Bathroom Shower/Tub: Producer, Television/film/video: Handicapped height Bathroom Accessibility: Yes   Home Equipment: BSC/3in1;Wheelchair - Biomedical Scientist (2 wheels)          Prior Functioning/Environment Prior Level of Function : Needs assist       Physical Assist : Mobility (physical);ADLs (physical) Mobility (physical): Bed mobility;Transfers;Gait ADLs (physical): Bathing;Dressing;Toileting Mobility Comments: Assist for mobility since recent hospitalization. Prior to that hospitalization she was Mod I with rolling walker ADLs Comments: Assist with recent hospitalization    OT Problem List: Decreased activity tolerance;Impaired balance (sitting and/or standing);Decreased knowledge of use of DME or AE;Decreased knowledge of precautions;Cardiopulmonary status limiting activity   OT Treatment/Interventions: Self-care/ADL training;Therapeutic exercise;Energy conservation;DME and/or  AE instruction;Therapeutic activities;Patient/family education;Balance training      OT Goals(Current goals can be found in the care plan section)   Acute Rehab OT Goals Patient Stated Goal: to get better OT Goal Formulation: With patient Time For Goal Achievement: 07/06/24 Potential to Achieve Goals: Good ADL Goals Pt Will Perform Grooming: with supervision;standing Pt Will Perform Upper Body Dressing: Independently;sitting Pt Will Perform Lower Body Dressing: with min assist;sitting/lateral leans Pt Will Transfer to Toilet: with modified independence;bedside commode;stand pivot transfer   OT Frequency:  Min 2X/week    Co-evaluation              AM-PAC OT 6 Clicks Daily Activity     Outcome Measure Help from another person eating meals?: None Help from another person taking care of personal grooming?: A Little Help from another person toileting, which includes using toliet, bedpan, or urinal?: Total Help from another person bathing (including washing, rinsing, drying)?: A Lot Help from another person to put on and taking off regular upper body clothing?: A Little Help from another person to put on and taking off regular lower body clothing?: A Lot 6 Click Score: 15   End of Session Equipment Utilized During Treatment: Rolling walker (2 wheels);Oxygen  Nurse Communication: Mobility status  Activity Tolerance: Patient tolerated treatment well Patient left: in bed;with call bell/phone within reach;with bed alarm set;with family/visitor present  OT Visit Diagnosis: Unsteadiness on feet (R26.81);Muscle weakness (generalized) (M62.81)  Time: 8492-8476 OT Time Calculation (min): 16 min Charges:  OT General Charges $OT Visit: 1 Visit OT Evaluation $OT Eval Low Complexity: 1 Low  Maurilio CROME, OTR/L.  [facility] Acute Rehabilitation  Office: [phone]   Maurilio PARAS Ilyana Manuele 06/22/2024, 3:54 PM

## 2024-06-23 ENCOUNTER — Inpatient Hospital Stay (HOSPITAL_COMMUNITY)

## 2024-06-23 DIAGNOSIS — I3139 Other pericardial effusion (noninflammatory): Secondary | ICD-10-CM | POA: Diagnosis not present

## 2024-06-23 DIAGNOSIS — J90 Pleural effusion, not elsewhere classified: Secondary | ICD-10-CM | POA: Diagnosis not present

## 2024-06-23 DIAGNOSIS — R0602 Shortness of breath: Secondary | ICD-10-CM | POA: Diagnosis not present

## 2024-06-23 DIAGNOSIS — J9621 Acute and chronic respiratory failure with hypoxia: Secondary | ICD-10-CM | POA: Diagnosis not present

## 2024-06-23 DIAGNOSIS — I5033 Acute on chronic diastolic (congestive) heart failure: Secondary | ICD-10-CM | POA: Diagnosis not present

## 2024-06-23 HISTORY — PX: IR THORACENTESIS RIGHT ASP PLEURAL SPACE W/IMG GUIDE: IMG5380

## 2024-06-23 LAB — CBC WITH DIFFERENTIAL/PLATELET
Abs Immature Granulocytes: 0.02 K/uL (ref 0.00–0.07)
Basophils Absolute: 0 K/uL (ref 0.0–0.1)
Basophils Relative: 0 %
Eosinophils Absolute: 0.1 K/uL (ref 0.0–0.5)
Eosinophils Relative: 1 %
HCT: 31.8 % — ABNORMAL LOW (ref 36.0–46.0)
Hemoglobin: 10.3 g/dL — ABNORMAL LOW (ref 12.0–15.0)
Immature Granulocytes: 0 %
Lymphocytes Relative: 12 %
Lymphs Abs: 1 K/uL (ref 0.7–4.0)
MCH: 29.7 pg (ref 26.0–34.0)
MCHC: 32.4 g/dL (ref 30.0–36.0)
MCV: 91.6 fL (ref 80.0–100.0)
Monocytes Absolute: 1.2 K/uL — ABNORMAL HIGH (ref 0.1–1.0)
Monocytes Relative: 14 %
Neutro Abs: 6.1 K/uL (ref 1.7–7.7)
Neutrophils Relative %: 73 %
Platelets: 109 K/uL — ABNORMAL LOW (ref 150–400)
RBC: 3.47 MIL/uL — ABNORMAL LOW (ref 3.87–5.11)
RDW: 16.5 % — ABNORMAL HIGH (ref 11.5–15.5)
WBC: 8.4 K/uL (ref 4.0–10.5)
nRBC: 0 % (ref 0.0–0.2)

## 2024-06-23 LAB — MAGNESIUM: Magnesium: 1.7 mg/dL (ref 1.7–2.4)

## 2024-06-23 LAB — PROTIME-INR
INR: 3.9 — ABNORMAL HIGH (ref 0.8–1.2)
Prothrombin Time: 39.7 s — ABNORMAL HIGH (ref 11.4–15.2)

## 2024-06-23 LAB — COMPREHENSIVE METABOLIC PANEL WITH GFR
ALT: 19 U/L (ref 0–44)
AST: 29 U/L (ref 15–41)
Albumin: 2.8 g/dL — ABNORMAL LOW (ref 3.5–5.0)
Alkaline Phosphatase: 147 U/L — ABNORMAL HIGH (ref 38–126)
Anion gap: 10 (ref 5–15)
BUN: 16 mg/dL (ref 8–23)
CO2: 38 mmol/L — ABNORMAL HIGH (ref 22–32)
Calcium: 8.4 mg/dL — ABNORMAL LOW (ref 8.9–10.3)
Chloride: 91 mmol/L — ABNORMAL LOW (ref 98–111)
Creatinine, Ser: 0.68 mg/dL (ref 0.44–1.00)
GFR, Estimated: >60 mL/min
Glucose, Bld: 95 mg/dL (ref 70–99)
Potassium: 3.4 mmol/L — ABNORMAL LOW (ref 3.5–5.1)
Sodium: 139 mmol/L (ref 135–145)
Total Bilirubin: 0.6 mg/dL (ref 0.0–1.2)
Total Protein: 7 g/dL (ref 6.5–8.1)

## 2024-06-23 LAB — BODY FLUID CELL COUNT WITH DIFFERENTIAL
Eos, Fluid: 2 %
Lymphs, Fluid: 55 %
Monocyte-Macrophage-Serous Fluid: 26 % — ABNORMAL LOW (ref 50–90)
Neutrophil Count, Fluid: 16 % (ref 0–25)
Other Cells, Fluid: 1 %
Total Nucleated Cell Count, Fluid: 1142 uL — ABNORMAL HIGH (ref 0–1000)

## 2024-06-23 LAB — TYPE AND SCREEN
ABO/RH(D): A POS
Antibody Screen: NEGATIVE

## 2024-06-23 LAB — GLUCOSE, CAPILLARY
Glucose-Capillary: 92 mg/dL (ref 70–99)
Glucose-Capillary: 99 mg/dL (ref 70–99)
Glucose-Capillary: 109 mg/dL — ABNORMAL HIGH (ref 70–99)
Glucose-Capillary: 118 mg/dL — ABNORMAL HIGH (ref 70–99)
Glucose-Capillary: 122 mg/dL — ABNORMAL HIGH (ref 70–99)

## 2024-06-23 LAB — PROCALCITONIN: Procalcitonin: <0.1 ng/mL

## 2024-06-23 LAB — C-REACTIVE PROTEIN: CRP: 7.6 mg/dL — ABNORMAL HIGH

## 2024-06-23 LAB — PRO BRAIN NATRIURETIC PEPTIDE: Pro Brain Natriuretic Peptide: 3127 pg/mL — ABNORMAL HIGH

## 2024-06-23 LAB — LACTATE DEHYDROGENASE: LDH: 218 U/L (ref 105–235)

## 2024-06-23 MED ORDER — BOOST PLUS PO LIQD
237.0000 mL | Freq: Two times a day (BID) | ORAL | Status: DC
  Administered 2024-06-23 – 2024-07-01 (×14): 237 mL via ORAL
  Filled 2024-06-23 (×18): qty 237

## 2024-06-23 MED ORDER — POTASSIUM CHLORIDE CRYS ER 20 MEQ PO TBCR
40.0000 meq | EXTENDED_RELEASE_TABLET | Freq: Once | ORAL | Status: AC
  Administered 2024-06-23: 40 meq via ORAL
  Filled 2024-06-23: qty 2

## 2024-06-23 MED ORDER — IOHEXOL 350 MG/ML SOLN
75.0000 mL | Freq: Once | INTRAVENOUS | Status: AC | PRN
  Administered 2024-06-23: 75 mL via INTRAVENOUS

## 2024-06-23 MED ORDER — LIDOCAINE-EPINEPHRINE 1 %-1:100000 IJ SOLN
INTRAMUSCULAR | Status: AC
  Filled 2024-06-23: qty 20

## 2024-06-23 MED ORDER — LORAZEPAM 0.5 MG PO TABS
0.5000 mg | ORAL_TABLET | Freq: Two times a day (BID) | ORAL | Status: DC | PRN
  Administered 2024-06-23 – 2024-06-30 (×7): 0.5 mg via ORAL
  Filled 2024-06-23 (×7): qty 1

## 2024-06-23 NOTE — Evaluation (Signed)
 Clinical/Bedside Swallow Evaluation Patient Details  Name: SARABI SOCKWELL MRN: [id number] Date of Birth: 11-11-1940  Today's Date: 06/23/2024 Time: SLP Start Time (ACUTE ONLY): 1407 SLP Stop Time (ACUTE ONLY): 1445 SLP Time Calculation (min) (ACUTE ONLY): 38 min  Past Medical History:  Past Medical History:  Diagnosis Date   Anxiety    takes Lorazepam  daily as needed   Arthritis    Cancer (HCC)    thyroid    Cataracts, bilateral    immature   Chronic back pain    compressed vertebrae,takes Fosamax weekly   Family history of adverse reaction to anesthesia    granddaughter gets very sick and mean   History of blood transfusion    no abnormal reaction noted   History of bronchitis 05/2015   History of colon polyps    benign   Hyperlipidemia    takes Zetia  daily   Hypertension    takes Diltiazem  and Avapro  daily   Hypothyroidism    takes Synthroid  daily   Ischemic colitis    hx of-many yrs ago   Joint pain    Joint swelling    OSA on CPAP    05-08-12 AHi was 46, RDI  53. titrated to   9 cm water   with 3 cm flex.-nadir 75%   PE (pulmonary embolism)    takes Coumadin  daily   Peripheral edema    Pneumonia 1992   hx of   Sarcoidosis    Shortness of breath    Urinary frequency    Weakness    left leg.Numbness and tingling.Has sciatica   Wheeze    occaionally. Not new   Past Surgical History:  Past Surgical History:  Procedure Laterality Date   ABDOMINAL HYSTERECTOMY     APPENDECTOMY     BREAST BIOPSY     biopsies on right x 2   BREAST CYST ASPIRATION Left    CHOLECYSTECTOMY N/A 11/29/2015   Procedure: LAPAROSCOPIC CHOLECYSTECTOMY;  Surgeon: Jina Nephew, MD;  Location: [facility] OR;  Service: General;  Laterality: N/A;   COLONOSCOPY     DILATION AND CURETTAGE OF UTERUS     IR THORACENTESIS RIGHT ASP PLEURAL SPACE W/IMG GUIDE  06/23/2024   LIVER BIOPSY     MELANOMA EXCISION     removed from left leg   scalene surgery  1972   trying to find out what was wrong  with her lungs   THYROID  SURGERY     HPI:  84yo female admitted from [facility] 06/21/24 with SOB, acute hypoxic respiratory failure, pleural effusion, and acute on chronic CHF. DC'd from [facility]  06/20/24 after bacteremia and pericardial effusion. CXR: small left/right pleural effusions, mild pulmonary edema and bibasilar homogeneous opacities No prior ST intervention. PMH: thyroid  cancer, CHF, HTN, HLD, sarcoidosis, arthritis, anxiety, thyroid  CA, AFib, PE, DM, OSA. Urgent thoracentesis 03/23/25, on 10L HFNC    Assessment / Plan / Recommendation  Clinical Impression  Pt presents with suspected primary esophageal dysphagia. She has had difficulty with globus sensation and regurgitation for several months, but reports no history of GERD, esophageal dilation, or PPI use. Per pt/spouse, her voice quality has been abnormal for the past several weeks-months as well. CN exam unremarkable. Volitional cough adequate, but congested. Suction was set up by SLP to facilitate thorough oral care. Pt completed oral care after set up.   Pt accepted trials of thin liquid, puree, and solid textures. Timely oral prep of thin and puree consistencies. Extended oral prep and slight dental residue with dry graham  cracker. Liquid wash effectively cleared residue. No overt s/s aspiration observed across textures. Several minutes after PO presentations had ended, pt exhibited belching and regurgitation.   Recommend consideration of a regular barium swallow (esophagram) to evaluate esophageal motility. Pt was encouraged to sit up right during and for 30+ minutes after meals, begin meals with warm liquid (broth, tea, etc), take small bites/sips at slow rate. SLP provided tea bags and broth packets for meal time. Pt/family encouraged to request hot water  from nursing. ST will follow for education once results of BaSw are available. Pending results, pt may benefit from ENT consult given altered voice quality. RN/MD/RD informed.  SLP Visit  Diagnosis: Dysphagia, unspecified (R13.10)    Aspiration Risk  Mild aspiration risk    Diet Recommendation Dysphagia 3 (Mech soft);Thin liquid    Liquid Administration via: Cup;Straw Medication Administration: Other (Comment) (as tolerated) Supervision: Patient able to self feed Compensations: Minimize environmental distractions;Slow rate;Small sips/bites;Other (Comment) (begin meal with warm liquid) Postural Changes: Seated upright at 90 degrees;Remain upright for at least 30 minutes after po intake    Other Recommendations Oral Care Recommendations: Oral care BID Caregiver Recommendations: Have oral suction available     Swallow Evaluation Recommendations Caregiver Recommendations: Have oral suction available   Assistance Recommended at Discharge  TBD  Functional Status Assessment Patient has had a recent decline in their functional status and demonstrates the ability to make significant improvements in function in a reasonable and predictable amount of time.   Frequency and Duration min 1 x/week  2 weeks       Prognosis Prognosis for improved oropharyngeal function: Good      Swallow Study   General Date of Onset: 06/21/24 HPI: 84yo female admitted from [facility] 06/21/24 with SOB, acute hypoxic respiratory failure, pleural effusion, and acute on chronic CHF. DC'd from [facility]  06/20/24 after bacteremia and pericardial effusion. CXR: small left/right pleural effusions, mild pulmonary edema and bibasilar homogeneous opacities No prior ST intervention. PMH: thyroid  cancer, CHF, HTN, HLD, sarcoidosis, arthritis, anxiety, thyroid  CA, AFib, PE, DM, OSA. Urgent thoracentesis 03/23/25, on 10L HFNC Type of Study: Bedside Swallow Evaluation Previous Swallow Assessment: none found Diet Prior to this Study: Dysphagia 3 (mechanical soft);Thin liquids (Level 0) Temperature Spikes Noted: No Respiratory Status: Other (comment) (10L HFNC) History of Recent Intubation: No Behavior/Cognition:  Alert;Pleasant mood;Cooperative Oral Cavity Assessment: Within Functional Limits Oral Care Completed by SLP: Yes (suction set up to facilitate thorough oral care) Oral Cavity - Dentition: Adequate natural dentition Vision: Functional for self-feeding Self-Feeding Abilities: Able to feed self Patient Positioning: Upright in bed Baseline Vocal Quality: Hoarse;Other (comment) (high pitch. Husband reports voice has been abnormal for several weeks-months.) Volitional Cough: Strong Volitional Swallow: Able to elicit    Oral/Motor/Sensory Function Overall Oral Motor/Sensory Function: Within functional limits   Ice Chips Ice chips: Not tested   Thin Liquid Thin Liquid: Within functional limits Presentation: Straw    Nectar Thick Nectar Thick Liquid: Not tested   Honey Thick Honey Thick Liquid: Not tested   Puree Puree: Within functional limits Presentation: Spoon   Solid     Solid: Impaired Presentation: Self Fed Oral Phase Functional Implications: Prolonged oral transit;Oral residue Other Comments: liquid wash effectively cleared oral residue on dentition     Onyekachi Gathright B. Dory, MSP, CCC-SLP Speech Language Pathologist Office: [phone]  Dory Caprice Daring 06/23/2024,3:02 PM

## 2024-06-23 NOTE — Progress Notes (Addendum)
 Initial Nutrition Assessment  DOCUMENTATION CODES:  Severe malnutrition in context of acute illness/injury  INTERVENTION:  Adjust diet to DYS3 for easier chewing and swallowing Monitor SLP recommendations Boost Plus po BID, each supplement provides 360 kcal and 14 grams of protein If pt unable to meet nutrition needs and aligns with GOC, consider cortrak placement for enteral support. Would likely benefit from ongoing PMT support. Recommend re-consulting this admission as pt has continued to have weakness and poor PO  NUTRITION DIAGNOSIS:    related to acute illness (multiple recent hospitalizations) as evidenced by moderate muscle depletion, percent weight loss, energy intake < or equal to 50% for > or equal to 5 days.  GOAL:  Patient will meet greater than or equal to 90% of their needs  MONITOR:  PO intake, Weight trends, Supplement acceptance, I & O's, Labs  REASON FOR ASSESSMENT:  Consult  (nutritional goals)  ASSESSMENT:  Pt with hx of thyroid  cancer, hypothyroidism, OSA, HTN, HLD, atrial fibrillation, DM type 2, and hx of sarcoidosis presented to ED with SOB from SNF after being discharged from [facility] the day prior. Found to have worsening pericardial effusion.   1/2-1/12: Admission to [facility] for weakness, poor PO, N/V; discharged to [facility] 1/13 - presented to [facility] ED for SOB and respiratory distress from SNF 1/15 - thoracentesis in IR, removed, SLP BSE, recommends esophogram to evaluate for esophageal motility issues  Pt resting in bed at the time of assessment. Several family members and spouse present in room able to assist with hx as pt got drowsy during visit.   Pt with decrease in respiratory status this AM, found ot have large pleural effusion and had thoracentesis earlier this AM with almost 1L removed. States that breathing is much better now but that she is still feeling anxious. Also reports poor intake for weeks and that she has not been able  to swallow. Reports that food feels like it won't go down. Has not had anything much to eat since Tuesday per husband. Had jello this AM after procedure but pt had to spit it out as she felt like she couldn't swallow it. SLP to see - noted that evaluation occurred in PM and recommended barium swallow to evaluate for esophageal dysphasia.   Husband reports appetite has been poor at home for more than a few weeks - suspects months and has been ongoing prior to this admission and recent admission at [facility] (1/2-1/12). State that pt has only been taking a few bites of food at her meals before stating she doesn't want anymore. Pt used to do cooking, but husband has taken over recently. Reports poor energy and a decline in functional status. Prior to decline, pt eating 3 meals a day, breakfast: cereal; lunch: hamburger; dinner: spaghetti, pizza, etc.  Inquired about weight. Pt reports that even with changes in fluid, she has been consistently losing weight at her PCP. On exam, pt with muscle depletions. Meets criteria for malnutrition. Noted that PMT followed pt during last hospitalization.  Admit / Current weight: 102.1 kg   PCP Visit weights: 05/04/24: 111.7 kg 06/07/24: 102.3 kg 8.6% weight loss x 2 months if accurate, severe  Nutritionally Relevant Medications: Scheduled Meds:  furosemide   40 mg Intravenous BID   insulin  aspart  0-9 Units Subcutaneous Q4H   levothyroxine   112 mcg Oral q morning   pantoprazole  IV  40 mg Intravenous Q24H   Warfarin - Pharmacist Dosing Inpatient   Does not apply q1600  Continuous Infusions:  ampicillin -sulbactam (UNASYN ) IV 3 g (06/23/24 1040)   PRN Meds: alum & mag hydroxide-simeth, ondansetron  (ZOFRAN ) IV, polyethylene glycol  Labs Reviewed: Potassium 3.4 Chloride 91 CBG ranges from 92-119 mg/dL over the last 24 hours HgbA1c 7.6% (1/2)  NUTRITION - FOCUSED PHYSICAL EXAM: Suspect fluid and adipose tissue are obscuring severity of muscle  depletions Flowsheet Row Most Recent Value  Orbital Region Mild depletion  Upper Arm Region No depletion  Thoracic and Lumbar Region No depletion  Buccal Region Mild depletion  Temple Region No depletion  Clavicle Bone Region Moderate depletion  Clavicle and Acromion Bone Region Mild depletion  Scapular Bone Region Mild depletion  Dorsal Hand Severe depletion  Patellar Region No depletion  Anterior Thigh Region No depletion  Posterior Calf Region No depletion  [wrapped in compression wrap]  Edema (RD Assessment) Moderate  [generalized]  Hair Reviewed  Eyes Reviewed  Mouth Reviewed  Skin Reviewed  [dry]  Nails Reviewed  [thin]    Diet Order:   Diet Order             Diet NPO time specified Except for: Sips with Meds  Diet effective midnight           DIET DYS 3 Room service appropriate? Yes with Assist; Fluid consistency: Thin  Diet effective now                   EDUCATION NEEDS:  Education needs have been addressed  Skin:  Skin Assessment: Skin Integrity Issues: Stage 2: - sacrum, medial Cellulitis: - bilateral lower legs  Last BM:  unsure  Height:  Ht Readings from Last 1 Encounters:  06/21/24 5' 4 (1.626 m)    Weight:  Wt Readings from Last 1 Encounters:  06/21/24 102.1 kg    Ideal Body Weight:  54.5 kg  BMI:  Body mass index is 38.62 kg/m.  Estimated Nutritional Needs:  Kcal:  1600-1800 kcal/d Protein:  80-95g/d Fluid:  1.8L/d    Vernell Lukes, RD, LDN, CNSC Registered Dietitian II Please reach out via secure chat

## 2024-06-23 NOTE — NC FL2 (Signed)
 " [place]  MEDICAID FL2 LEVEL OF CARE FORM     IDENTIFICATION  Patient Name: [name] Birthdate: 03-May-1941 Sex: female Admission Date (Current Location): 06/21/2024  [facility] and [place] Number:  Producer, Television/film/video and Address:  [facility] [place] [address], [place], [place] [address]      Provider Number: [id number]  Attending Physician Name and Address:  Dennise Lavada POUR, MD  Relative Name and Phone Number:  [name] Flinders(spouse) [phone]    Current Level of Care: Hospital Recommended Level of Care: Skilled Nursing Facility Prior Approval Number:    Date Approved/Denied:   PASRR Number: [id number]  Discharge Plan: SNF    Current Diagnoses: Patient Active Problem List   Diagnosis Date Noted   Pleural effusion 06/23/2024   SOB (shortness of breath) 06/21/2024   Respiratory distress 06/21/2024   Acute on chronic hypoxic respiratory failure (HCC) 06/21/2024   Acute on chronic diastolic (congestive) heart failure (HCC) 06/21/2024   Generalized weakness 06/13/2024   Pericardial effusion 06/13/2024   Warfarin-induced coagulopathy 06/13/2024   Hyperkalemia 06/13/2024   AKI (acute kidney injury) 06/10/2024   Atrial fibrillation with RVR (HCC) 05/12/2024   Atypical chest pain 05/12/2024   Acute on chronic heart failure with preserved ejection fraction (HFpEF) (HCC) 09/26/2022   Permanent atrial fibrillation (HCC) 09/26/2022   Warfarin anticoagulation 09/18/2022   Sepsis (HCC) 09/18/2022   Sepsis due to cellulitis (HCC) 09/18/2022   Lower extremity cellulitis 09/18/2022   Chronic intermittent hypoxia with obstructive sleep apnea 09/12/2021   Severe obstructive sleep apnea-hypopnea syndrome 09/12/2021   Acute medial meniscus tear of right knee 03/31/2018   Acute pain of right knee 01/12/2018   Hypercoagulation syndrome 05/24/2014   Hunter's glossitis 05/24/2014   Sarcoidosis 05/24/2014   Hypoxemia 05/24/2014   Primary  hypertension 10/13/2013   Nocturnal hypoxia 05/19/2013   Obstructive sleep apnea (adult) (pediatric) 05/19/2013   Obesity 05/19/2013   OSA on CPAP    Diffuse lymphadenopathy 05/26/2012   Cough 05/20/2012   OSA (obstructive sleep apnea) 05/20/2012   Pulmonary embolism (HCC) 04/29/2012   DVT (deep venous thrombosis) (HCC) 04/29/2012    Orientation RESPIRATION BLADDER Height & Weight     Self, Time, Situation, Place  O2 (HFNC- see d/c summary) Incontinent, Indwelling catheter Weight: 225 lb (102.1 kg) Height:  5' 4 (162.6 cm)  BEHAVIORAL SYMPTOMS/MOOD NEUROLOGICAL BOWEL NUTRITION STATUS      Continent Diet  AMBULATORY STATUS COMMUNICATION OF NEEDS Skin   Limited Assist Verbally PU Stage and Appropriate Care (legs,toes,below knees dermatitis-wound care see d/c summary)                       Personal Care Assistance Level of Assistance  Bathing, Feeding, Dressing Bathing Assistance: Limited assistance Feeding assistance: Limited assistance Dressing Assistance: Limited assistance     Functional Limitations Info    Sight Info: Impaired Hearing Info: Adequate Speech Info: Adequate    SPECIAL CARE FACTORS FREQUENCY  PT (By licensed PT), OT (By licensed OT)     PT Frequency: 5x/week OT Frequency: 5x/week            Contractures Contractures Info: Not present    Additional Factors Info  Code Status, Allergies Code Status Info: Full Allergies Info: Atenolol, Citalopram Hydrobromide, Clindamycin /lincomycin, Codeine, Coenzyme Q10, Erythromycin, Motrin (Ibuprofen), Statins, Latex, Losartan Potassium           Current Medications (06/23/2024):  This is the current hospital active medication list  Current Facility-Administered Medications  Medication Dose Route Frequency Provider Last Rate Last Admin   acetaminophen  (TYLENOL ) tablet 650 mg  650 mg Oral Q6H PRN Patel, Ekta V, MD       Or   acetaminophen  (TYLENOL ) suppository 650 mg  650 mg Rectal Q6H PRN Patel, Ekta  V, MD       alum & mag hydroxide-simeth (MAALOX/MYLANTA) 200-200-20 MG/5ML suspension 30 mL  30 mL Oral Q4H PRN Patel, Ekta V, MD   30 mL at 06/22/24 0645   Ampicillin -Sulbactam (UNASYN ) 3 g in sodium chloride  0.9 % 100 mL IVPB  3 g Intravenous Q8H Pahwani, Ravi, MD 200 mL/hr at 06/23/24 1040 3 g at 06/23/24 1040   carvedilol  (COREG ) tablet 3.125 mg  3.125 mg Oral BID WC Patel, Ekta V, MD   3.125 mg at 06/23/24 1030   diltiazem  (CARDIZEM ) tablet 120 mg  120 mg Oral Q8H Patel, Ekta V, MD   120 mg at 06/23/24 9491   furosemide  (LASIX ) injection 40 mg  40 mg Intravenous BID Crozier, Peyton, PA-C   40 mg at 06/23/24 0631   guaiFENesin  (MUCINEX ) 12 hr tablet 600 mg  600 mg Oral BID PRN Patel, Ekta V, MD   600 mg at 06/21/24 2201   insulin  aspart (novoLOG ) injection 0-9 Units  0-9 Units Subcutaneous Q4H Tobie Mario GAILS, MD       lactose free nutrition (BOOST PLUS) liquid 237 mL  237 mL Oral BID WC Singh, Prashant K, MD       levothyroxine  (SYNTHROID ) tablet 112 mcg  112 mcg Oral q morning Patel, Ekta V, MD   112 mcg at 06/23/24 9491   ondansetron  (ZOFRAN ) tablet 4 mg  4 mg Oral Q6H PRN Tobie Mario GAILS, MD       Or   ondansetron  (ZOFRAN ) injection 4 mg  4 mg Intravenous Q6H PRN Patel, Ekta V, MD       pantoprazole  (PROTONIX ) injection 40 mg  40 mg Intravenous Q24H Pahwani, Ravi, MD   40 mg at 06/23/24 0825   polyethylene glycol (MIRALAX  / GLYCOLAX ) packet 17 g  17 g Oral Daily PRN Patel, Ekta V, MD       sodium chloride  flush (NS) 0.9 % injection 3 mL  3 mL Intravenous Q12H Tobie Mario GAILS, MD   3 mL at 06/22/24 2100   Warfarin - Pharmacist Dosing Inpatient   Does not apply q1600 Jimenez, Aileen, [facility]   Given at 06/21/24 1650     Discharge Medications: Please see discharge summary for a list of discharge medications.  Relevant Imaging Results:  Relevant Lab Results:   Additional Information ss#[phone] Student-Social Work     "

## 2024-06-23 NOTE — Procedures (Addendum)
 PROCEDURE SUMMARY:  Successful image-guided diagnostic and therapeutic left thoracentesis. Yielded 900 mL of blood-tinged fluid. Patient tolerated procedure well. EBL: Zero No immediate complications.  Specimen sent for labs. Pending post procedure CXR.   Please see imaging section of Epic for full dictation.  Thersia LULLA Rummer PA-C 06/23/2024 9:23 AM

## 2024-06-23 NOTE — Progress Notes (Signed)
 "                                                                                                                                                                                                                                                                                PROGRESS NOTE     Patient Demographics:    [name], is a 84 y.o. female, DOB - 1941-01-29, FMW:[id number]  Outpatient Primary MD for the patient is [name], MD    LOS - 2  Admit date - 06/21/2024    Chief Complaint  Patient presents with   Shortness of Breath       Brief Narrative (HPI from H&P)   84 y.o. female with past medical history  of   thyroid  cancer, hyperlipidemia, hypertension, hypothyroid, anxiety, arthritis, OSA on CPAP, and history of sarcoidosis Most recently she was admitted from June 10, 2024 with shock, Staphylococcus epidermidis bacteremia, acute kidney injury, hyperkalemia, and moderate pericardial effusion and discharged June 20, 2024, presented back to ED with shortness of breath.   During recent hospitalization, She was initially admitted to the ICU ,received fresh frozen plasma, vitamin K , lokelma , bicarbonate infusion, and empiric antibiotics. Echocardiography revealed an ejection fraction of 45-50% with a large pericardial effusion without tamponade. Pt had S. epidermidis bacteremia / completed 7 days of IV linezolid .  Patient was seen by cardiology on January 7 for her pericardial effusion with no tamponade on echo with plan for recheck echocardiogram.  Patient was discharged on 5 L of oxygen .  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  Diagnosed with acute hypoxic respiratory failure.  Transferred to my care on 06/23/2024.    Subjective:    [name] today has, No  headache, No chest pain, No abdominal pain - No Nausea, No new weakness tingling or numbness, does have considerable shortness of breath.   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  Patient currently on  10 L nasal cannula oxygen , has been seen by cardiology and pulmonary, left-sided pleural effusion has progressed considerably hence IR requested to do ultrasound-guided therapeutic and diagnostic thoracentesis on 06/23/2024, continue supportive care, will also check CTA to rule out PE although chances are low she is already therapeutic on Coumadin .  There is a question of aspiration pneumonia for which  She is on Unasyn , clinically chances are low but will complete a total 5-day course.  Continue diuresis as tolerated with IV diuretics.    Permanent atrial fibrillation/history of PE: Rate controlled.  Continue Coreg  and Cardizem .  On Coumadin , INR slightly supratherapeutic.  Pharmacy managing Coumadin  dose.  Lab Results  Component Value Date   INR 3.9 (H) 06/23/2024   INR 3.5 (H) 06/22/2024   INR 2.6 (H) 06/21/2024   PROTIME 25.2 (H) 11/29/2014   Hypertension: Blood pressure controlled.  Continue Cardizem , holding home dose of Avapro .   GERD:   on PPI.   Acquired hypothyroidism: Continue Synthroid .   OSA: Nightly CPAP ordered.   Anemia of chronic disease: Stable.   Obesity.  BMI 38.  Follow-up with PCP.  Type 2 diabetes mellitus:   Not on any medications PTA.  Currently on SSI.  Lab Results  Component Value Date   HGBA1C 7.6 (H) 06/10/2024   CBG (last 3)  Recent Labs    06/22/24 2109 06/22/24 2320 06/23/24 0320  GLUCAP 106* 96 92        Condition - Extremely Guarded  Family Communication  : Husband [name] [phone]  called on 06/23/2024 at 8:07 AM  Code Status : Full code  Consults  : IR, cardiology,  pulmonary  PUD Prophylaxis :     Procedures  :     Left ultrasound-guided thoracentesis requested on 06/23/2024   CTA.    Cardiac MRI.  1. There is a moderate pericardial effusion. The inferior vena cava is dilated with reduced respiratory collapse, consistent with elevated right sided filling pressures. In diastole, the left ventricle has a D shaped configuration with accentuated respiratory variation in septal position (septal bounce) on strain imaging and with free breathing, indicating ventricular interdependence. This is seen during inspiration and can be seen in constrictive physiology. 2. Normal left ventricular systolic function (LVEF =61%). 3. Decreased right ventricular systolic function (RVEF =38%). 4. There are bilateral pleural effusions with secondary atelectasis  Echocardiogram -  1. Moderate pericardial effusion which is more prominent up to 2.0 cm posterior to the LV (best seen subcostal; image 49). The RV is poorly visualized, but does not appear to collapse in diastole. The IVC is dilated. There is evidence of respiratory variation in septal movement on image 41. Findings are suggestive of a clinically significant pericardial effusion. Clinical correlation is recommended. Appearance is stable compared to prior study. Moderate pericardial effusion. The pericardial effusion  is circumferential.  2. Right ventricular systolic function was not well visualized. The right ventricular size is not well visualized. Tricuspid regurgitation signal is inadequate for assessing PA pressure.  3. The mitral valve is grossly normal. Trivial mitral valve regurgitation. No evidence of mitral stenosis.  4. The aortic valve is tricuspid. Aortic valve regurgitation is not visualized. Aortic valve sclerosis is present, with no evidence of aortic valve stenosis.  5. The inferior vena cava is dilated in size with <50% respiratory variability, suggesting right atrial pressure of 15 mmHg.  6. Left ventricular  ejection fraction, by estimation, is 60 to 65%. The left ventricle has normal function. Left ventricular endocardial border not  optimally defined to evaluate regional wall motion. Comparison(s): No significant change from prior study.      Disposition Plan  :    Status is: Inpatient PPI  DVT Prophylaxis  : Coumadin   Lab Results  Component Value Date   INR 3.9 (H) 06/23/2024   INR 3.5 (H) 06/22/2024   INR 2.6 (H) 06/21/2024   PROTIME 25.2 (H) 11/29/2014     Lab Results  Component Value Date   PLT 109 (L) 06/23/2024    Diet :  Diet Order             Diet NPO time specified Except for: Sips with Meds, Ice Chips  Diet effective now                    Inpatient Medications  Scheduled Meds:  carvedilol   3.125 mg Oral BID WC   diltiazem   120 mg Oral Q8H   furosemide   40 mg Intravenous BID   insulin  aspart  0-9 Units Subcutaneous Q4H   levothyroxine   112 mcg Oral q morning   pantoprazole  (PROTONIX ) IV  40 mg Intravenous Q24H   sodium chloride  flush  3 mL Intravenous Q12H   Warfarin - Pharmacist Dosing Inpatient   Does not apply q1600   Continuous Infusions:  ampicillin -sulbactam (UNASYN ) IV 3 g (06/22/24 2241)   PRN Meds:.acetaminophen  **OR** acetaminophen , alum & mag hydroxide-simeth, guaiFENesin , ondansetron  **OR** ondansetron  (ZOFRAN ) IV, polyethylene glycol     Objective:   Vitals:   06/23/24 0400 06/23/24 0415 06/23/24 0508 06/23/24 0636  BP: 129/65  125/76   Pulse: 72  75   Resp: 18  20   Temp: 97.6 F (36.4 C)     TempSrc: Oral     SpO2: 94% 95% 92% 93%  Weight:      Height:        Wt Readings from Last 3 Encounters:  06/21/24 102.1 kg  06/20/24 108.7 kg  05/27/24 109.3 kg     Intake/Output Summary (Last 24 hours) at 06/23/2024 0759 Last data filed at 06/23/2024 0600 Gross per 24 hour  Intake --  Output 2400 ml  Net -2400 ml     Physical Exam  Awake Alert, No new F.N deficits, in mild respiratory distress [place].AT,PERRAL Supple  Neck, No JVD,   Symmetrical Chest wall movement, Good air movement bilaterally, reduced bibasilar breath sounds left more reduced than right RRR,No Gallops,Rubs or new Murmurs,  +ve B.Sounds, Abd Soft, No tenderness,   1+ chronic bilateral lower extremity edema     RN pressure injury documentation: Wound 06/22/24 1541 Pressure Injury Sacrum Medial Stage 2 -  Partial thickness loss of dermis presenting as a shallow open injury with a red, pink wound bed without slough. (Active)      Data Review:    Recent Labs  Lab 06/18/24 0529 06/19/24 0514 06/21/24 1237 06/22/24 0404 06/23/24 0740  WBC 8.3 7.9 10.9* 10.0 8.4  HGB 10.3* 10.8* 10.7* 10.1* 10.3*  HCT 31.4* 32.6* 33.8* 32.6* 31.8*  PLT 227 217 175 156 109*  MCV 91.0 91.1 93.1 93.4 91.6  MCH 29.9 30.2 29.5 28.9 29.7  MCHC 32.8 33.1 31.7 31.0 32.4  RDW 16.1* 15.9* 16.4* 16.4* 16.5*  LYMPHSABS 1.3 1.2 0.7 0.9 1.0  MONOABS 1.0 0.8 0.9 1.0 1.2*  EOSABS 0.3 0.1 0.1 0.1 0.1  BASOSABS 0.0 0.0 0.0 0.0 0.0    Recent Labs  Lab 06/17/24 0556 06/17/24 1131 06/18/24 0529 06/19/24 0514 06/20/24 0447 06/21/24 1237 06/21/24 1501  06/21/24 2223 06/22/24 0404 06/23/24 0300  NA 127*  --  126* 126* 128* 133*  --   --  137  --   K 4.5  --  4.4 4.3 4.1 4.2  --   --  3.8  --   CL 91*  --  90* 89* 90* 93*  --   --  95*  --   CO2 26  --  28 28 29  32  --   --  33*  --   ANIONGAP 10  --  9 9 9 9   --   --  8  --   GLUCOSE 86  --  103* 106* 87 134*  --   --  106*  --   BUN 30*  --  29* 27* 25* 22  --   --  20  --   CREATININE 1.22*  --  1.30* 1.23* 1.01* 0.94  --   --  0.82  --   AST  --   --   --   --   --  26  --   --   --   --   ALT  --   --   --   --   --  16  --   --   --   --   ALKPHOS  --   --   --   --   --  154*  --   --   --   --   BILITOT  --   --   --   --   --  0.6  --   --   --   --   ALBUMIN  --   --   --   --   --  2.8*  --   --   --   --   LATICACIDVEN  --   --   --   --   --   --  2.7* 1.8  --   --   INR  --    < > 1.5*  1.9* 2.2* 2.6*  --   --  3.5* 3.9*  MG 2.5*  --  2.4 2.1 1.9  --   --   --  1.9  --   CALCIUM  8.8*  --  8.7* 8.7* 8.5* 8.3*  --   --  8.3*  --    < > = values in this interval not displayed.      Recent Labs  Lab 06/17/24 0556 06/17/24 1131 06/18/24 0529 06/19/24 0514 06/20/24 0447 06/21/24 1237 06/21/24 1501 06/21/24 2223 06/22/24 0404 06/23/24 0300  LATICACIDVEN  --   --   --   --   --   --  2.7* 1.8  --   --   INR  --    < > 1.5* 1.9* 2.2* 2.6*  --   --  3.5* 3.9*  MG 2.5*  --  2.4 2.1 1.9  --   --   --  1.9  --   CALCIUM  8.8*  --  8.7* 8.7* 8.5* 8.3*  --   --  8.3*  --    < > = values in this interval not displayed.    --------------------------------------------------------------------------------------------------------------- Lab Results  Component Value Date   CHOL 242 (H) 01/12/2023   HDL 62 01/12/2023   LDLCALC 157 (H) 01/12/2023   TRIG 130 01/12/2023   CHOLHDL 3.9 01/12/2023    Lab Results  Component Value Date   HGBA1C 7.6 (  H) 06/10/2024   No results for input(s): TSH, T4TOTAL, FREET4, T3FREE, THYROIDAB in the last 72 hours. No results for input(s): VITAMINB12, FOLATE, FERRITIN, TIBC, IRON, RETICCTPCT in the last 72 hours. ------------------------------------------------------------------------------------------------------------------ Cardiac Enzymes No results for input(s): CKMB, TROPONINI, MYOGLOBIN in the last 168 hours.  Invalid input(s): CK  Micro Results Recent Results (from the past 240 hours)  Culture, blood (Routine X 2) w Reflex to ID Panel     Status: None   Collection Time: 06/16/24  2:22 PM   Specimen: BLOOD LEFT HAND  Result Value Ref Range Status   Specimen Description   Final    BLOOD LEFT HAND Performed at [facility], [address] [place], [place] [address]    Special Requests   Final    BOTTLES DRAWN AEROBIC AND ANAEROBIC Blood Culture adequate volume Performed at [facility], [address], [place], [place] [address]    Culture   Final    NO GROWTH 5 DAYS Performed at [facility], [address], [place], [place] [address]    Report Status 06/21/2024 FINAL  Final  Culture, blood (Routine X 2) w Reflex to ID Panel     Status: None   Collection Time: 06/16/24  2:27 PM   Specimen: BLOOD LEFT HAND  Result Value Ref Range Status   Specimen Description   Final    BLOOD LEFT HAND Performed at [facility], [address] [place], [place] [address]    Special Requests   Final    BOTTLES DRAWN AEROBIC AND ANAEROBIC Blood Culture adequate volume Performed at [facility], [address], [place], [place] [address]    Culture   Final    NO GROWTH 5 DAYS Performed at [facility], [address] [place], [place] [address]    Report Status 06/21/2024 FINAL  Final  Resp panel by RT-PCR (RSV, Flu A&B, Covid) Anterior Nasal Swab     Status: None   Collection Time: 06/21/24 12:26 PM   Specimen: Anterior Nasal Swab  Result Value Ref Range Status   SARS Coronavirus 2 by RT PCR NEGATIVE NEGATIVE Final   Influenza A by PCR NEGATIVE NEGATIVE Final   Influenza B by PCR NEGATIVE NEGATIVE Final    Comment: (NOTE) The Xpert Xpress SARS-CoV-2/FLU/RSV plus assay is intended as an aid in the diagnosis of influenza from Nasopharyngeal swab specimens and should not be used as a sole basis for treatment. Nasal washings and aspirates are unacceptable for Xpert Xpress SARS-CoV-2/FLU/RSV testing.  Fact Sheet for Patients: bloggercourse.com  Fact Sheet for Healthcare Providers: seriousbroker.it  This test is not yet approved or cleared by the [place]  FDA and has been authorized for detection and/or diagnosis of SARS-CoV-2 by FDA under an Emergency Use Authorization (EUA). This EUA will remain in effect (meaning this test can be used) for the duration of the COVID-19  declaration under Section 564(b)(1) of the Act, 21 U.S.C. section 360bbb-3(b)(1), unless the authorization is terminated or revoked.     Resp Syncytial Virus by PCR NEGATIVE NEGATIVE Final    Comment: (NOTE) Fact Sheet for Patients: bloggercourse.com  Fact Sheet for Healthcare Providers: seriousbroker.it  This test is not yet approved or cleared by the [place]  FDA and has been authorized for detection and/or diagnosis of SARS-CoV-2 by FDA under an Emergency Use Authorization (EUA). This EUA will remain in effect (meaning this test can be used) for the duration of the  COVID-19 declaration under Section 564(b)(1) of the Act, 21 U.S.C. section 360bbb-3(b)(1), unless the authorization is terminated or revoked.  Performed at [facility], [address] [place], [place] [address]     Radiology Report DG Chest Port 1 View Result Date: 06/23/2024 EXAM: 1 VIEW(S) XRAY OF THE CHEST 06/23/2024 06:30:00 AM COMPARISON: 06/22/2024 CLINICAL HISTORY: SOB (shortness of breath) FINDINGS: LUNGS AND PLEURA: Large left pleural effusion, likely increased. Small right pleural effusion. Bibasilar homogeneous opacities. Mild pulmonary edema. No pneumothorax. HEART AND MEDIASTINUM: Cardiomegaly. Aortic arch atherosclerosis. Calcified mediastinal and hilar lymph nodes. BONES AND SOFT TISSUES: No acute osseous abnormality. IMPRESSION: 1. Large left pleural effusion, likely increased, and small right pleural effusion. 2. Bibasilar homogeneous opacities and mild pulmonary edema. 3. Cardiomegaly, aortic arch atherosclerosis, and calcified mediastinal and hilar lymph nodes. Electronically signed by: Evalene Coho MD 06/23/2024 06:37 AM EST RP Workstation: [id number]   DG CHEST PORT 1 VIEW Result Date: 06/22/2024 EXAM: 1 VIEW(S) XRAY OF THE CHEST 06/22/2024 10:32:00 PM COMPARISON: 06/22/2024 CLINICAL HISTORY: SOB (shortness of breath) FINDINGS: LUNGS  AND PLEURA: Hazy opacification of left lung. Large left-sided pleural effusion is noted. Moderate right effusion is seen. Central vascular congestion is noted. These changes have worsened in the interval from the prior exam, although they may be positional in nature. No pneumothorax. HEART AND MEDIASTINUM: Cardiomegaly. Calcified mediastinal and hilar lymph nodes. BONES AND SOFT TISSUES: No acute osseous abnormality. IMPRESSION: 1. Worsening hazy opacification of the left lung with large left pleural effusion and moderate right pleural effusion 2. Cardiomegaly with central vascular congestion. Electronically signed by: Oneil Devonshire MD 06/22/2024 10:43 PM EST RP Workstation: [id number]   MR CARDIAC MORPHOLOGY W WO CONTRAST Result Date: 06/22/2024 CLINICAL DATA:  Clinical question of constrictive pericarditis. Study assumes HCT of 33 and BSA of 2.15 m2. EXAM: CARDIAC MRI TECHNIQUE: The patient was scanned on a 1.5 Tesla GE magnet. A dedicated cardiac coil was used. Functional imaging was done using Fiesta sequences. 2,3, and 4 chamber views were done to assess for RWMA's. Modified Simpson's rule using was used to calculate an ejection fraction on a dedicated work [employer]. The patient received 10 cc of Gadavist . After 10 minutes inversion recovery sequences were used to assess for infiltration and scar tissue. Flow quantification was performed 2 times during this examination with flow quantification performed at the levels of the ascending aorta above the valve, pulmonary artery above the valve. CONTRAST:  10 cc  of Gadavist  FINDINGS: 1. Normal left ventricular size, with LVEDD 54 mm, and LVEDVi 55 mL/m2. Normal left ventricular thickness, with intraventricular septal thickness of 6 mm, posterior wall thickness of 5 mm, and myocardial mass index of 49 g/m2. Normal left ventricular systolic function (LVEF =61%). There are no regional wall motion abnormalities. Left ventricular parametric mapping  (1.5 T, MOLLI) notable for mild T2 increase (57 ms) and increase in ECV 38%. There is late gadolinium enhancement in the left ventricular myocardium- inferior insertion point LGE. Normal resting perfusion. 2. Normal right ventricular size with RVEDVI 69 mL/m2. Normal right ventricular thickness. Decreased right ventricular systolic function (RVEF =38%). There are no regional wall motion abnormalities or aneurysms, septal hypokinesis. Preserved basal excursion. 3. Bi-atrial dilation. IVC dilation 25 mm. No IVC collapse see on exam. QP/Qs is 1.13 but with no visualized shunting. 4. Normal size of the aortic root, ascending aorta. Moderate dilation of the main pulmonary artery, 31 mm. 5. Valve assessment: Aortic Valve: Tri-leaflet, no significant stenosis or regurgitation.  Mean gradient 2 mm Hg. Aortic regurgitant fraction <1 %. Pulmonic Valve:Normal morphology. No significant stenosis or regurgitation. Regurgitant fraction < 1% Tricuspid Valve:Tri-leaflet, qualitatively mild tricuspid regurgitation. Mitral Valve: Normal morphology. No significant stenosis or prolapse. Mitral regurgitant volume 3 mL, regurgitant fraction 4% 6. Normal pericardial thickness. No pericardial hyper-enhancement. Normal slippage on tagging sequences. There is a D shaped septum seen in inspiration that is not seen during expiratory. A septal bounce is seen on strain imaging. Moderate pericardial effusion. Circumferential with anterior predominance. 7. There are bilateral pleural effusions with secondary atelectasis. There are hilar and mediastinal lymph nodes greater than 1 cm. Recommended dedicated study if clinically indicated. 8. Free breathing artifacts noted. This decreased the sensitivity of RV quantification. IMPRESSION: 1. There is a moderate pericardial effusion. The inferior vena cava is dilated with reduced respiratory collapse, consistent with elevated right sided filling pressures. In diastole, the left ventricle has a D shaped  configuration with accentuated respiratory variation in septal position (septal bounce) on strain imaging and with free breathing, indicating ventricular interdependence. This is seen during inspiration and can be seen in constrictive physiology. 2. Normal left ventricular systolic function (LVEF =61%). 3. Decreased right ventricular systolic function (RVEF =38%). 4. There are bilateral pleural effusions with secondary atelectasis. Reviewed with Drs. Patwardhan and Pahwani. Stanly Leavens MD Electronically Signed   By: Stanly Leavens M.D.   On: 06/22/2024 13:50   MR CARDIAC VELOCITY FLOW MAP Result Date: 06/22/2024 CLINICAL DATA:  Clinical question of constrictive pericarditis. Study assumes HCT of 33 and BSA of 2.15 m2. EXAM: CARDIAC MRI TECHNIQUE: The patient was scanned on a 1.5 Tesla GE magnet. A dedicated cardiac coil was used. Functional imaging was done using Fiesta sequences. 2,3, and 4 chamber views were done to assess for RWMA's. Modified Simpson's rule using was used to calculate an ejection fraction on a dedicated work [employer]. The patient received 10 cc of Gadavist . After 10 minutes inversion recovery sequences were used to assess for infiltration and scar tissue. Flow quantification was performed 2 times during this examination with flow quantification performed at the levels of the ascending aorta above the valve, pulmonary artery above the valve. CONTRAST:  10 cc  of Gadavist  FINDINGS: 1. Normal left ventricular size, with LVEDD 54 mm, and LVEDVi 55 mL/m2. Normal left ventricular thickness, with intraventricular septal thickness of 6 mm, posterior wall thickness of 5 mm, and myocardial mass index of 49 g/m2. Normal left ventricular systolic function (LVEF =61%). There are no regional wall motion abnormalities. Left ventricular parametric mapping (1.5 T, MOLLI) notable for mild T2 increase (57 ms) and increase in ECV 38%. There is late gadolinium enhancement in  the left ventricular myocardium- inferior insertion point LGE. Normal resting perfusion. 2. Normal right ventricular size with RVEDVI 69 mL/m2. Normal right ventricular thickness. Decreased right ventricular systolic function (RVEF =38%). There are no regional wall motion abnormalities or aneurysms, septal hypokinesis. Preserved basal excursion. 3. Bi-atrial dilation. IVC dilation 25 mm. No IVC collapse see on exam. QP/Qs is 1.13 but with no visualized shunting. 4. Normal size of the aortic root, ascending aorta. Moderate dilation of the main pulmonary artery, 31 mm. 5. Valve assessment: Aortic Valve: Tri-leaflet, no significant stenosis or regurgitation. Mean gradient 2 mm Hg. Aortic regurgitant fraction <1 %. Pulmonic Valve:Normal morphology. No significant stenosis or regurgitation. Regurgitant fraction < 1% Tricuspid Valve:Tri-leaflet, qualitatively mild tricuspid regurgitation. Mitral Valve: Normal morphology. No significant stenosis or prolapse. Mitral regurgitant volume 3 mL, regurgitant fraction  4% 6. Normal pericardial thickness. No pericardial hyper-enhancement. Normal slippage on tagging sequences. There is a D shaped septum seen in inspiration that is not seen during expiratory. A septal bounce is seen on strain imaging. Moderate pericardial effusion. Circumferential with anterior predominance. 7. There are bilateral pleural effusions with secondary atelectasis. There are hilar and mediastinal lymph nodes greater than 1 cm. Recommended dedicated study if clinically indicated. 8. Free breathing artifacts noted. This decreased the sensitivity of RV quantification. IMPRESSION: 1. There is a moderate pericardial effusion. The inferior vena cava is dilated with reduced respiratory collapse, consistent with elevated right sided filling pressures. In diastole, the left ventricle has a D shaped configuration with accentuated respiratory variation in septal position (septal bounce) on strain imaging and with free  breathing, indicating ventricular interdependence. This is seen during inspiration and can be seen in constrictive physiology. 2. Normal left ventricular systolic function (LVEF =61%). 3. Decreased right ventricular systolic function (RVEF =38%). 4. There are bilateral pleural effusions with secondary atelectasis. Reviewed with Drs. Patwardhan and Pahwani. Stanly Leavens MD Electronically Signed   By: Stanly Leavens M.D.   On: 06/22/2024 13:50   MR CARDIAC VELOCITY FLOW MAP Result Date: 06/22/2024 CLINICAL DATA:  Clinical question of constrictive pericarditis. Study assumes HCT of 33 and BSA of 2.15 m2. EXAM: CARDIAC MRI TECHNIQUE: The patient was scanned on a 1.5 Tesla GE magnet. A dedicated cardiac coil was used. Functional imaging was done using Fiesta sequences. 2,3, and 4 chamber views were done to assess for RWMA's. Modified Simpson's rule using was used to calculate an ejection fraction on a dedicated work [employer]. The patient received 10 cc of Gadavist . After 10 minutes inversion recovery sequences were used to assess for infiltration and scar tissue. Flow quantification was performed 2 times during this examination with flow quantification performed at the levels of the ascending aorta above the valve, pulmonary artery above the valve. CONTRAST:  10 cc  of Gadavist  FINDINGS: 1. Normal left ventricular size, with LVEDD 54 mm, and LVEDVi 55 mL/m2. Normal left ventricular thickness, with intraventricular septal thickness of 6 mm, posterior wall thickness of 5 mm, and myocardial mass index of 49 g/m2. Normal left ventricular systolic function (LVEF =61%). There are no regional wall motion abnormalities. Left ventricular parametric mapping (1.5 T, MOLLI) notable for mild T2 increase (57 ms) and increase in ECV 38%. There is late gadolinium enhancement in the left ventricular myocardium- inferior insertion point LGE. Normal resting perfusion. 2. Normal right ventricular size  with RVEDVI 69 mL/m2. Normal right ventricular thickness. Decreased right ventricular systolic function (RVEF =38%). There are no regional wall motion abnormalities or aneurysms, septal hypokinesis. Preserved basal excursion. 3. Bi-atrial dilation. IVC dilation 25 mm. No IVC collapse see on exam. QP/Qs is 1.13 but with no visualized shunting. 4. Normal size of the aortic root, ascending aorta. Moderate dilation of the main pulmonary artery, 31 mm. 5. Valve assessment: Aortic Valve: Tri-leaflet, no significant stenosis or regurgitation. Mean gradient 2 mm Hg. Aortic regurgitant fraction <1 %. Pulmonic Valve:Normal morphology. No significant stenosis or regurgitation. Regurgitant fraction < 1% Tricuspid Valve:Tri-leaflet, qualitatively mild tricuspid regurgitation. Mitral Valve: Normal morphology. No significant stenosis or prolapse. Mitral regurgitant volume 3 mL, regurgitant fraction 4% 6. Normal pericardial thickness. No pericardial hyper-enhancement. Normal slippage on tagging sequences. There is a D shaped septum seen in inspiration that is not seen during expiratory. A septal bounce is seen on strain imaging. Moderate pericardial effusion. Circumferential with anterior predominance. 7.  There are bilateral pleural effusions with secondary atelectasis. There are hilar and mediastinal lymph nodes greater than 1 cm. Recommended dedicated study if clinically indicated. 8. Free breathing artifacts noted. This decreased the sensitivity of RV quantification. IMPRESSION: 1. There is a moderate pericardial effusion. The inferior vena cava is dilated with reduced respiratory collapse, consistent with elevated right sided filling pressures. In diastole, the left ventricle has a D shaped configuration with accentuated respiratory variation in septal position (septal bounce) on strain imaging and with free breathing, indicating ventricular interdependence. This is seen during inspiration and can be seen in constrictive  physiology. 2. Normal left ventricular systolic function (LVEF =61%). 3. Decreased right ventricular systolic function (RVEF =38%). 4. There are bilateral pleural effusions with secondary atelectasis. Reviewed with Drs. Patwardhan and Pahwani. Stanly Leavens MD Electronically Signed   By: Stanly Leavens M.D.   On: 06/22/2024 13:50   DG CHEST PORT 1 VIEW Result Date: 06/22/2024 CLINICAL DATA:  Pleural effusion.  Shortness of breath. EXAM: PORTABLE CHEST 1 VIEW COMPARISON:  06/21/2024 FINDINGS: Lungs are hypoinflated demonstrate continued evidence of moderate to large left effusion likely with associated compressive atelectasis in the left base. Stable hazy opacification over the right base. Underlying pneumonia is possible as suggested on recent CT. Mild stable cardiomegaly. Remainder of the exam is unchanged. IMPRESSION: 1. Stable moderate to large left effusion likely with associated compressive atelectasis in the left base. Stable hazy opacification over the right base. Underlying pneumonia in the lung bases is possible. 2. Stable cardiomegaly. Electronically Signed   By: Toribio Agreste M.D.   On: 06/22/2024 13:42   ECHOCARDIOGRAM LIMITED Result Date: 06/21/2024    ECHOCARDIOGRAM LIMITED REPORT   Patient Name:   PRESCIOUS HURLESS Dillard Date of Exam: 06/21/2024 Medical Rec #:  [id number]            Height:       64.0 in Accession #:    [id number]           Weight:       239.6 lb Date of Birth:  1941/05/18            BSA:          2.113 m Patient Age:    83 years             BP:           144/74 mmHg Patient Gender: F                    HR:           94 bpm. Exam Location:  Inpatient Procedure: Limited Echo, Cardiac Doppler and Color Doppler (Both Spectral and            Color Flow Doppler were utilized during procedure). Indications:     Pericardial Effusion I31.3  History:         Patient has prior history of Echocardiogram examinations, most                  recent 06/14/2024. Pericardial Effusion;  Risk Factors:Sleep Apnea                  and Hypertension.  Sonographer:     Koleen Popper RDCS Referring Phys:  [id number] KEVEN FILA Diagnosing Phys: Darryle Decent MD  Sonographer Comments: Image acquisition challenging due to patient body habitus. IMPRESSIONS  1. Moderate pericardial effusion which is more prominent up to 2.0 cm posterior to the LV (best  seen subcostal; image 49). The RV is poorly visualized, but does not appear to collapse in diastole. The IVC is dilated. There is evidence of respiratory variation in septal movement on image 41. Findings are suggestive of a clinically significant pericardial effusion. Clinical correlation is recommended. Appearance is stable compared to prior study. Moderate pericardial effusion. The pericardial effusion  is circumferential.  2. Right ventricular systolic function was not well visualized. The right ventricular size is not well visualized. Tricuspid regurgitation signal is inadequate for assessing PA pressure.  3. The mitral valve is grossly normal. Trivial mitral valve regurgitation. No evidence of mitral stenosis.  4. The aortic valve is tricuspid. Aortic valve regurgitation is not visualized. Aortic valve sclerosis is present, with no evidence of aortic valve stenosis.  5. The inferior vena cava is dilated in size with <50% respiratory variability, suggesting right atrial pressure of 15 mmHg.  6. Left ventricular ejection fraction, by estimation, is 60 to 65%. The left ventricle has normal function. Left ventricular endocardial border not optimally defined to evaluate regional wall motion. Comparison(s): No significant change from prior study. FINDINGS  Left Ventricle: Left ventricular ejection fraction, by estimation, is 60 to 65%. The left ventricle has normal function. Left ventricular endocardial border not optimally defined to evaluate regional wall motion. The left ventricular internal cavity size was normal in size. There is no left ventricular  hypertrophy. Right Ventricle: The right ventricular size is not well visualized. Right vetricular wall thickness was not well visualized. Right ventricular systolic function was not well visualized. Tricuspid regurgitation signal is inadequate for assessing PA pressure. Left Atrium: Left atrial size was normal in size. Right Atrium: Right atrial size was normal in size. Pericardium: Moderate pericardial effusion which is more prominent up to 2.0 cm posterior to the LV (best seen subcostal; image 49). The RV is poorly visualized, but does not appear to collapse in diastole. The IVC is dilated. There is evidence of respiratory variation in septal movement on image 41. Findings are suggestive of a clinically significant pericardial effusion. Clinical correlation is recommended. Appearance is stable compared to prior study. A moderately sized pericardial effusion is present. The pericardial effusion is circumferential. Presence of epicardial fat layer. Mitral Valve: The mitral valve is grossly normal. Trivial mitral valve regurgitation. No evidence of mitral valve stenosis. Aortic Valve: The aortic valve is tricuspid. Aortic valve regurgitation is not visualized. Aortic valve sclerosis is present, with no evidence of aortic valve stenosis. Pulmonic Valve: The pulmonic valve was grossly normal. Pulmonic valve regurgitation is trivial. No evidence of pulmonic stenosis. Aorta: The aortic root and ascending aorta are structurally normal, with no evidence of dilitation. Venous: The inferior vena cava is dilated in size with less than 50% respiratory variability, suggesting right atrial pressure of 15 mmHg. IAS/Shunts: The atrial septum is grossly normal. Additional Comments: Spectral Doppler performed. Color Doppler performed.  LEFT VENTRICLE PLAX 2D LVIDd:         4.60 cm LVIDs:         3.30 cm LV PW:         0.90 cm LV IVS:        1.10 cm LVOT diam:     2.00 cm LV SV:         58 LV SV Index:   28 LVOT Area:     3.14 cm   LV Volumes (MOD) LV vol d, MOD A4C: 122.0 ml LV vol s, MOD A4C: 44.8 ml LV SV MOD A4C:  122.0 ml IVC IVC diam: 2.40 cm LEFT ATRIUM           Index LA diam:      3.90 cm 1.85 cm/m LA Vol (A4C): 52.0 ml 24.61 ml/m  AORTIC VALVE LVOT Vmax:   103.00 cm/s LVOT Vmean:  67.600 cm/s LVOT VTI:    0.185 m  AORTA Ao Root diam: 3.10 cm Ao Asc diam:  3.30 cm  SHUNTS Systemic VTI:  0.18 m Systemic Diam: 2.00 cm Darryle Decent MD Electronically signed by Darryle Decent MD Signature Date/Time: 06/21/2024/6:21:03 PM    Final (Updated)    DG Chest Port 1 View Result Date: 06/21/2024 CLINICAL DATA:  Shortness of breath. EXAM: PORTABLE CHEST 1 VIEW COMPARISON:  06/10/2024, CT 06/10/2024 FINDINGS: Lungs are adequately inflated demonstrate moderate opacification over the lower half of the left hemithorax likely large effusion with associated basilar compressive atelectasis. Mild hazy density over the right base possibly small layering effusion with atelectasis. Mild hazy opacification of the perihilar markings suggesting mild degree of vascular congestion. Multiple calcified hilar/mediastinal lymph nodes. Stable cardiomegaly. Remainder of the exam is unchanged. IMPRESSION: 1. Moderate opacification over the lower half of the left hemithorax likely large effusion with associated basilar compressive atelectasis. Possible small layering right effusion with atelectasis. 2. Stable cardiomegaly with suggestion of mild vascular congestion. Electronically Signed   By: Toribio Agreste M.D.   On: 06/21/2024 13:09     Signature  -   Lavada Stank M.D on 06/23/2024 at 7:59 AM   -  To page go to [email or web address]   "

## 2024-06-23 NOTE — Progress Notes (Signed)
 OT Cancellation Note  Patient Details Name: [name] MRN: [id number] DOB: 1941-05-27   Cancelled Treatment:    Reason Eval/Treat Not Completed: Fatigue/lethargy limiting ability to participate;Other (comment) Pt politely declined OT session due to sleepiness. Agreeable for therapy to check back in AM tomorrow.  Denali Sharma 06/23/2024, 1:48 PM

## 2024-06-23 NOTE — Plan of Care (Signed)
 " Problem: Education: Goal: Ability to describe self-care measures that may prevent or decrease complications (Diabetes Survival Skills Education) will improve 06/23/2024 0035 by Jerona Cowboy, RN Outcome: Progressing 06/23/2024 0035 by Jerona Cowboy, RN Outcome: Progressing Goal: Individualized Educational Video(s) 06/23/2024 0035 by Jerona Cowboy, RN Outcome: Progressing 06/23/2024 0035 by Jerona Cowboy, RN Outcome: Progressing   Problem: Coping: Goal: Ability to adjust to condition or change in health will improve 06/23/2024 0035 by Jerona Cowboy, RN Outcome: Progressing 06/23/2024 0035 by Jerona Cowboy, RN Outcome: Progressing   Problem: Fluid Volume: Goal: Ability to maintain a balanced intake and output will improve 06/23/2024 0035 by Jerona Cowboy, RN Outcome: Progressing 06/23/2024 0035 by Jerona Cowboy, RN Outcome: Progressing   Problem: Health Behavior/Discharge Planning: Goal: Ability to identify and utilize available resources and services will improve 06/23/2024 0035 by Jerona Cowboy, RN Outcome: Progressing 06/23/2024 0035 by Jerona Cowboy, RN Outcome: Progressing Goal: Ability to manage health-related needs will improve 06/23/2024 0035 by Jerona Cowboy, RN Outcome: Progressing 06/23/2024 0035 by Jerona Cowboy, RN Outcome: Progressing   Problem: Metabolic: Goal: Ability to maintain appropriate glucose levels will improve 06/23/2024 0035 by Jerona Cowboy, RN Outcome: Progressing 06/23/2024 0035 by Jerona Cowboy, RN Outcome: Progressing   Problem: Nutritional: Goal: Maintenance of adequate nutrition will improve 06/23/2024 0035 by Jerona Cowboy, RN Outcome: Progressing 06/23/2024 0035 by Jerona Cowboy, RN Outcome: Progressing Goal: Progress toward achieving an optimal weight will improve 06/23/2024 0035 by Jerona Cowboy, RN Outcome: Progressing 06/23/2024 0035 by Jerona Cowboy, RN Outcome: Progressing   Problem: Skin Integrity: Goal: Risk for  impaired skin integrity will decrease 06/23/2024 0035 by Jerona Cowboy, RN Outcome: Progressing 06/23/2024 0035 by Jerona Cowboy, RN Outcome: Progressing   Problem: Tissue Perfusion: Goal: Adequacy of tissue perfusion will improve 06/23/2024 0035 by Jerona Cowboy, RN Outcome: Progressing 06/23/2024 0035 by Jerona Cowboy, RN Outcome: Progressing   Problem: Education: Goal: Knowledge of General Education information will improve Description: Including pain rating scale, medication(s)/side effects and non-pharmacologic comfort measures 06/23/2024 0035 by Jerona Cowboy, RN Outcome: Progressing 06/23/2024 0035 by Jerona Cowboy, RN Outcome: Progressing   Problem: Health Behavior/Discharge Planning: Goal: Ability to manage health-related needs will improve 06/23/2024 0035 by Jerona Cowboy, RN Outcome: Progressing 06/23/2024 0035 by Jerona Cowboy, RN Outcome: Progressing   Problem: Clinical Measurements: Goal: Ability to maintain clinical measurements within normal limits will improve 06/23/2024 0035 by Jerona Cowboy, RN Outcome: Progressing 06/23/2024 0035 by Jerona Cowboy, RN Outcome: Progressing Goal: Will remain free from infection 06/23/2024 0035 by Jerona Cowboy, RN Outcome: Progressing 06/23/2024 0035 by Jerona Cowboy, RN Outcome: Progressing Goal: Diagnostic test results will improve 06/23/2024 0035 by Jerona Cowboy, RN Outcome: Progressing 06/23/2024 0035 by Jerona Cowboy, RN Outcome: Progressing Goal: Respiratory complications will improve 06/23/2024 0035 by Jerona Cowboy, RN Outcome: Progressing 06/23/2024 0035 by Jerona Cowboy, RN Outcome: Progressing Goal: Cardiovascular complication will be avoided 06/23/2024 0035 by Jerona Cowboy, RN Outcome: Progressing 06/23/2024 0035 by Jerona Cowboy, RN Outcome: Progressing   Problem: Activity: Goal: Risk for activity intolerance will decrease 06/23/2024 0035 by Jerona Cowboy, RN Outcome: Progressing 06/23/2024  0035 by Jerona Cowboy, RN Outcome: Progressing   Problem: Nutrition: Goal: Adequate nutrition will be maintained 06/23/2024 0035 by Jerona Cowboy, RN Outcome: Progressing 06/23/2024 0035 by Jerona Cowboy, RN Outcome: Progressing   Problem: Coping: Goal: Level of anxiety will decrease 06/23/2024 0035 by Jerona Cowboy, RN Outcome: Progressing 06/23/2024 0035 by Jerona Cowboy, RN Outcome: Progressing   Problem: Elimination: Goal: Will not experience complications related to bowel motility 06/23/2024  [id number] by Jerona Cowboy, RN Outcome: Progressing 06/23/2024 0035 by Jerona Cowboy, RN Outcome: Progressing Goal: Will not experience complications related to urinary retention 06/23/2024 0035 by Jerona Cowboy, RN Outcome: Progressing 06/23/2024 0035 by Jerona Cowboy, RN Outcome: Progressing   Problem: Pain Managment: Goal: General experience of comfort will improve and/or be controlled 06/23/2024 0035 by Jerona Cowboy, RN Outcome: Progressing 06/23/2024 0035 by Jerona Cowboy, RN Outcome: Progressing   Problem: Safety: Goal: Ability to remain free from injury will improve 06/23/2024 0035 by Jerona Cowboy, RN Outcome: Progressing 06/23/2024 0035 by Jerona Cowboy, RN Outcome: Progressing   Problem: Skin Integrity: Goal: Risk for impaired skin integrity will decrease 06/23/2024 0035 by Jerona Cowboy, RN Outcome: Progressing 06/23/2024 0035 by Jerona Cowboy, RN Outcome: Progressing   "

## 2024-06-23 NOTE — Consult Note (Signed)
 "  NAME:  [name], MRN:  [id number], DOB:  [birth year], LOS: 2 ADMISSION DATE:  06/21/2024, CONSULTATION DATE:  06/21/24 REFERRING MD:  Dr. Tobie , CHIEF COMPLAINT:  shortness of breath   History of Present Illness:  [name] is a 84 y.o. female with PMH of atrial fibrillation, OSA, hypothyroidism, HFpEF, and recent admission for hypotension, AKI, coumadin  coagulapathy and pericardial effusion discharged to SNF yesterday 06/20/24, who presents back to the ED today for shortness of breath and hypoxia while working with PT at SNF, reportedly SPO2 dropped to 80% on baseline O2, and was increased to 8L [place]. During recent hospitalization patient was started on lasix  1/9 for concern of volume overload, but hospital PT notes mention patient would desat into the 80s with exertion at that time. Today patient reports she is always short of breath but she did notice more shortness of breath with PT today and it took a while to get her SPO2 > 90%. At time of consult patient is back down to 3 L [place] with SPO2 > 90% and she is feeling better at rest. She denies any other complaints. BNP noted to be ~4,000 from 6,000 a few days prior, CXR concerning for edema. On POCUS small left effusion noted but no safe window for thoracentesis, and should likely improve with further diuresis. Repeat limited echo is ordered for evaluation of the pericardial effusion noted last admission.   Pertinent  Medical History   has a past medical history of Anxiety, Arthritis, Cancer (HCC), Cataracts, bilateral, Chronic back pain, Family history of adverse reaction to anesthesia, History of blood transfusion, History of bronchitis (05/2015), History of colon polyps, Hyperlipidemia, Hypertension, Hypothyroidism, Ischemic colitis, Joint pain, Joint swelling, OSA on CPAP, PE (pulmonary embolism), Peripheral edema, Pneumonia (1992), Sarcoidosis, Shortness of breath, Urinary frequency, Weakness, and Wheeze.   Significant Hospital  Events: Including procedures, antibiotic start and stop dates in addition to other pertinent events   1/13 consult for hypoxia, new baseline is 3L [place] from recent hospitalization   Interim History / Subjective:  Still hypoxemic.  Called to evaluate.  While she is net negative her pleural effusion looks lower on chest x-ray.  After thoracentesis she feels improved.  It appears oxygen  can be weaned based on oxygen  saturations.  Objective    Blood pressure 107/62, pulse 73, temperature 97.6 F (36.4 C), temperature source Oral, resp. rate (!) 22, height 5' 4 (1.626 m), weight 102.1 kg, SpO2 95%.    FiO2 (%):  [44 %-60 %] 60 %   Intake/Output Summary (Last 24 hours) at 06/23/2024 1800 Last data filed at 06/23/2024 1734 Gross per 24 hour  Intake --  Output 1950 ml  Net -1950 ml   Filed Weights   06/21/24 2125  Weight: 102.1 kg    Examination: General: Frail, elderly, lying in bed Eyes: EOMI Neck: Supple Pulmonary: Normal work of breathing, on 8 L, O2 sats high 90s Abdomen: Nondistended Cardiovascular: Warm, lower extremity edema noted  Resolved problem list   Assessment and Plan   Shortness of Breath Pericardial effusion Suspect acute on chronic diastolic congestive heart failure, HFpEF (45-50%) with exacerbation Acute on Chronic Hypoxic Respiratory Failure (baseline 3L) worsened with exertion Chest imaging, exam consistent with volume overload.  Pleural effusion and large despite diuresis.  BNP is down a little bit.  She is feeling better with diuresis and thoracentesis.  Etiology for her respiratory failure seems quite clearly related to cardiogenic volume overload. -- Aggressive IV diuresis as blood  pressure and kidney function allows -- Intermittent chest imaging if worsens, consider repeat thoracentesis if fluid reaccumulates -- Wean oxygen  as tolerated, goal O2 sat 88% given chronic hypoxemia, suspect will continue to improve with ongoing diuresis, she has quite a ways to  go  PCCM will sign off  Labs   CBC: Recent Labs  Lab 06/18/24 0529 06/19/24 0514 06/21/24 1237 06/22/24 0404 06/23/24 0740  WBC 8.3 7.9 10.9* 10.0 8.4  NEUTROABS 5.7 5.8 9.1* 8.0* 6.1  HGB 10.3* 10.8* 10.7* 10.1* 10.3*  HCT 31.4* 32.6* 33.8* 32.6* 31.8*  MCV 91.0 91.1 93.1 93.4 91.6  PLT 227 217 175 156 109*    Basic Metabolic Panel: Recent Labs  Lab 06/18/24 0529 06/19/24 0514 06/20/24 0447 06/21/24 1237 06/22/24 0404 06/23/24 0740  NA 126* 126* 128* 133* 137 139  K 4.4 4.3 4.1 4.2 3.8 3.4*  CL 90* 89* 90* 93* 95* 91*  CO2 28 28 29  32 33* 38*  GLUCOSE 103* 106* 87 134* 106* 95  BUN 29* 27* 25* 22 20 16   CREATININE 1.30* 1.23* 1.01* 0.94 0.82 0.68  CALCIUM  8.7* 8.7* 8.5* 8.3* 8.3* 8.4*  MG 2.4 2.1 1.9  --  1.9 1.7   GFR: Estimated Creatinine Clearance: 59.8 mL/min (by C-G formula based on SCr of 0.68 mg/dL). Recent Labs  Lab 06/19/24 0514 06/21/24 1237 06/21/24 1501 06/21/24 2223 06/22/24 0404 06/23/24 0740  PROCALCITON  --   --   --   --   --  <0.10  WBC 7.9 10.9*  --   --  10.0 8.4  LATICACIDVEN  --   --  2.7* 1.8  --   --     Liver Function Tests: Recent Labs  Lab 06/21/24 1237 06/23/24 0740  AST 26 29  ALT 16 19  ALKPHOS 154* 147*  BILITOT 0.6 0.6  PROT 7.0 7.0  ALBUMIN 2.8* 2.8*   No results for input(s): LIPASE, AMYLASE in the last 168 hours. No results for input(s): AMMONIA in the last 168 hours.  ABG No results found for: PHART, PCO2ART, PO2ART, HCO3, TCO2, ACIDBASEDEF, O2SAT   Coagulation Profile: Recent Labs  Lab 06/19/24 0514 06/20/24 0447 06/21/24 1237 06/22/24 0404 06/23/24 0300  INR 1.9* 2.2* 2.6* 3.5* 3.9*    Cardiac Enzymes: No results for input(s): CKTOTAL, CKMB, CKMBINDEX, TROPONINI in the last 168 hours.  HbA1C: Hgb A1c MFr Bld  Date/Time Value Ref Range Status  06/10/2024 04:41 PM 7.6 (H) 4.8 - 5.6 % Final    Comment:    (NOTE) Diagnosis of Diabetes The following HbA1c ranges  recommended by the American Diabetes Association (ADA) may be used as an aid in the diagnosis of diabetes mellitus.  Hemoglobin             Suggested A1C NGSP%              Diagnosis  <5.7                   Non Diabetic  5.7-6.4                Pre-Diabetic  >6.4                   Diabetic  <7.0                   Glycemic control for                       adults with  diabetes.      CBG: Recent Labs  Lab 06/22/24 2320 06/23/24 0320 06/23/24 0819 06/23/24 1114 06/23/24 1644  GLUCAP 96 92 99 109* 118*    Review of Systems:   See HPI  Past Medical History:  She,  has a past medical history of Anxiety, Arthritis, Cancer (HCC), Cataracts, bilateral, Chronic back pain, Family history of adverse reaction to anesthesia, History of blood transfusion, History of bronchitis (05/2015), History of colon polyps, Hyperlipidemia, Hypertension, Hypothyroidism, Ischemic colitis, Joint pain, Joint swelling, OSA on CPAP, PE (pulmonary embolism), Peripheral edema, Pneumonia (1992), Sarcoidosis, Shortness of breath, Urinary frequency, Weakness, and Wheeze.   Surgical History:   Past Surgical History:  Procedure Laterality Date   ABDOMINAL HYSTERECTOMY     APPENDECTOMY     BREAST BIOPSY     biopsies on right x 2   BREAST CYST ASPIRATION Left    CHOLECYSTECTOMY N/A 11/29/2015   Procedure: LAPAROSCOPIC CHOLECYSTECTOMY;  Surgeon: Jina Nephew, MD;  Location: [facility] OR;  Service: General;  Laterality: N/A;   COLONOSCOPY     DILATION AND CURETTAGE OF UTERUS     IR THORACENTESIS RIGHT ASP PLEURAL SPACE W/IMG GUIDE  06/23/2024   LIVER BIOPSY     MELANOMA EXCISION     removed from left leg   scalene surgery  1972   trying to find out what was wrong with her lungs   THYROID  SURGERY       Social History:   reports that she has never smoked. She has never used smokeless tobacco. She reports that she does not drink alcohol  and does not use drugs.   Family History:  Her family history includes  Breast cancer in her mother, sister, and sister; CVA in her mother. There is no history of Heart attack or Sleep apnea.   Allergies Allergies[1]   Home Medications  Prior to Admission medications  Medication Sig Start Date End Date Taking? Authorizing Provider  acetaminophen  (TYLENOL ) 325 MG tablet Take 325-650 mg by mouth 2 (two) times daily as needed for mild pain or headache.   Yes [provider]  bisacodyl (DULCOLAX) 10 MG suppository Place 10 mg rectally daily as needed for moderate constipation.   Yes [provider]  Carboxymethylcellulose Sodium (REFRESH LIQUIGEL OP) Place 1 drop into both eyes 2 (two) times daily as needed (Dry eye).   Yes [provider]  carvedilol  (COREG ) 3.125 MG tablet Take 1 tablet (3.125 mg total) by mouth 2 (two) times daily with a meal. 06/20/24  Yes Cheryle Page, MD  cyanocobalamin (VITAMIN B12) 1000 MCG tablet Take 1,000 mcg by mouth daily.   Yes [provider]  diltiazem  (CARDIZEM ) 120 MG tablet Take 1 tablet (120 mg total) by mouth every 8 (eight) hours. 06/20/24  Yes Cheryle Page, MD  furosemide  (LASIX ) 40 MG tablet Take 40 mg by mouth daily.   Yes [provider]  levothyroxine  (SYNTHROID ) 112 MCG tablet Take 112 mcg by mouth every morning.   Yes [provider]  LORazepam  (ATIVAN ) 1 MG tablet Take 1.5 tablets (1.5 mg total) by mouth See admin instructions. Take 1.5 mg by mouth at bedtime and an additional 1-1.5mg  once a day as needed for anxiety Patient taking differently: Take 1-1.5 mg by mouth See admin instructions. Take 1.5 mg by mouth at bedtime.  PRN order: take 1 mg by mouth every 24 hours as needed for anxiety. 06/20/24  Yes Cheryle Page, MD  magnesium  hydroxide (MILK OF MAGNESIA) 400 MG/5ML suspension Take 30  mLs by mouth daily as needed for mild constipation or moderate constipation.   Yes [provider]  melatonin 5 MG TABS Take 1 tablet (5 mg total) by mouth at bedtime as  needed (if lorazepam  doesn't work). Patient taking differently: Take 5 mg by mouth at bedtime as needed (insomnia). 06/20/24  Yes Cheryle Page, MD  polyethylene glycol (MIRALAX  / GLYCOLAX ) 17 g packet Take 17 g by mouth daily as needed for mild constipation. 06/20/24  Yes Cheryle Page, MD  senna-docusate (SENOKOT-S) 8.6-50 MG tablet Take 1-2 tablets by mouth 2 (two) times daily between meals as needed for mild constipation or moderate constipation. Patient taking differently: Take 2 tablets by mouth 2 (two) times daily as needed for mild constipation or moderate constipation. 05/16/24  Yes Gonfa, Taye T, MD  SIMPLY SALINE NA Place 1 spray into both nostrils every 12 (twelve) hours as needed (for dryness).   Yes [provider]  sodium chloride  1 g tablet Take 1 tablet (1 g total) by mouth 3 (three) times daily with meals. 06/20/24  Yes Cheryle Page, MD  Sodium Phosphates (ENEMA RE) Place 1 Dose rectally daily as needed (constipation).   Yes [provider]  warfarin (COUMADIN ) 5 MG tablet Take 5 mg by mouth at bedtime. 04/25/15  Yes [provider]  diltiazem  (CARDIZEM  CD) 360 MG 24 hr capsule Take 360 mg by mouth at bedtime. Patient not taking: Reported on 06/21/2024    [provider]  irbesartan  (AVAPRO ) 75 MG tablet Take 75 mg by mouth daily. Patient not taking: Reported on 06/21/2024    [provider]  nebivolol  (BYSTOLIC ) 5 MG tablet Take 5 mg by mouth daily. Patient not taking: Reported on 06/21/2024    [provider]  nystatin  cream (MYCOSTATIN ) Apply 1 Application topically 2 (two) times daily as needed for dry skin. Patient not taking: Reported on 06/21/2024 06/07/24   [provider]  triamcinolone cream (KENALOG) 0.1 % Apply 1 Application topically 2 (two) times daily as needed (Irritation). Patient not taking: Reported on 06/21/2024 04/05/21   [provider]    Donnice JONELLE Beals, MD 06/23/2024, 6:00 PM         [1]  Allergies Allergen Reactions   Atenolol Other (See Comments)    Depression    Citalopram Hydrobromide Nausea Only and Other (See Comments)    Nausea/Fatigue   Clindamycin /Lincomycin Other (See Comments)    Via IV, has caused a metallic taste in the mouth    Codeine Other (See Comments)    Caused Hyperactivity and Dysphoria   Coenzyme Q10 Itching   Erythromycin Itching, Anxiety and Other (See Comments)    Also with jitters   Motrin [Ibuprofen] Other (See Comments)    Is to not take this   Statins Other (See Comments)    Leg muscle weakness w/ rosuvastatin and simvastatin    Latex Rash   Losartan Potassium Other (See Comments)    Muscle aches    "

## 2024-06-23 NOTE — Progress Notes (Signed)
 "   Patient Name: [name] Date of Encounter: 06/23/2024 [facility] Cardiologist: Georganna Archer, MD   Interval Summary  .    Increasing O2 requirement this morning to up to 10 L, underwent urgent thoracentesis 900 cc Breathing improved  Vital Signs .    Vitals:   06/23/24 0415 06/23/24 0508 06/23/24 0636 06/23/24 0800  BP:  125/76    Pulse:  75    Resp:  20    Temp:    97.9 F (36.6 C)  TempSrc:    Oral  SpO2: 95% 92% 93%   Weight:      Height:        Intake/Output Summary (Last 24 hours) at 06/23/2024 1025 Last data filed at 06/23/2024 0600 Gross per 24 hour  Intake --  Output 2400 ml  Net -2400 ml      06/21/2024    9:25 PM 06/20/2024    4:45 AM 06/19/2024    5:00 AM  Last 3 Weights  Weight (lbs) 225 lb 239 lb 10.2 oz 240 lb 8.4 oz  Weight (kg) 102.059 kg 108.7 kg 109.1 kg      Telemetry/ECG    Telemetry 06/23/2024- Personally Reviewed Rate controlled A-fib  Chest x-ray 06/23/2024:1.  Significant interval decrease in left pleural effusion, now small. 2. Small right pleural effusion. 3. Cardiomegaly with mild pulmonary edema and bibasilar homogeneous opacities; aortic arch atherosclerosis and calcified mediastinal/hilar lymph nodes.  Cardiac MRI 06/23/2024: 1. There is a moderate pericardial effusion. The inferior vena cava is dilated with reduced respiratory collapse, consistent with elevated right sided filling pressures. In diastole, the left ventricle has a D shaped configuration with accentuated respiratory variation in septal position (septal bounce) on strain imaging and with free breathing, indicating ventricular interdependence. This is seen during inspiration and can be seen in constrictive physiology. 2. Normal left ventricular systolic function (LVEF =61%). 3. Decreased right ventricular systolic function (RVEF =38%). 4. There are bilateral pleural effusions with secondary atelectasis.  Echocardiogram 06/21/2024: 1.  Moderate pericardial effusion which is more prominent up to 2.0 cm  posterior to the LV (best seen subcostal; image 49). The RV is poorly  visualized, but does not appear to collapse in diastole. The IVC is  dilated. There is evidence of respiratory  variation in septal movement on image 41. Findings are suggestive of a  clinically significant pericardial effusion. Clinical correlation is  recommended. Appearance is stable compared to prior study. Moderate  pericardial effusion. The pericardial effusion   is circumferential.   2. Right ventricular systolic function was not well visualized. The right  ventricular size is not well visualized. Tricuspid regurgitation signal is  inadequate for assessing PA pressure.   3. The mitral valve is grossly normal. Trivial mitral valve  regurgitation. No evidence of mitral stenosis.   4. The aortic valve is tricuspid. Aortic valve regurgitation is not  visualized. Aortic valve sclerosis is present, with no evidence of aortic  valve stenosis.   5. The inferior vena cava is dilated in size with <50% respiratory  variability, suggesting right atrial pressure of 15 mmHg.   6. Left ventricular ejection fraction, by estimation, is 60 to 65%. The  left ventricle has normal function. Left ventricular endocardial border  not optimally defined to evaluate regional wall motion.   Comparison(s): No significant change from prior study.   Physical Exam .   Physical Exam Vitals and nursing note reviewed.  Constitutional:      General: She is not  in acute distress. Neck:     Vascular: No JVD.  Cardiovascular:     Rate and Rhythm: Normal rate and regular rhythm.     Heart sounds: Normal heart sounds. No murmur heard. Pulmonary:     Effort: Pulmonary effort is normal.     Breath sounds: Decreased breath sounds present. No wheezing or rales.     Comments: Rales resolved Musculoskeletal:     Right lower leg: Edema (Trace) present.     Left lower leg: Edema  (Trace) present.    No significant change since 06/22/2024  Assessment & Plan .     84 year old female with hypertension, hyperlipidemia, diabetes mellitus, sarcoidosis, OSA, DVT/PE, longstanding persistent A-fib (on warfarin), recent diagnosis of moderate to large pericardial effusion in the setting of uremia with subsequent resolution of uremia and improvement in pericardial effusion, discharged to SNF on 06/20/2024, readmitted with worsening shortness of breath and oxygen  requirement.   Acute on chronic hypoxic respiratory failure: Multifactorial, including moderate pericardial effusion with possible constriction physiology, pleural effusions, acute on chronic HFpEF. Increasing oxygen  requirement this morning requiring urgent thoracentesis with improvement in breathing. She is diuresing well with IV Lasix , continue the same. 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  Best hope is that she will clinically improved with thoracentesis and continue diuresis.  If she does not, we could consider pericardiocentesis.  Even though pericardial effusion is not large, draining the fluid may help.  We will change her anticoagulation from warfarin to heparin in the meantime, in case we do end up requiring to perform pericardiocentesis in the near future.  Permanent atrial fibrillation: Rate controlled.  Now on heparin.   We will continue to follow the patient with you.  For questions or updates, please contact  [facility] Please consult [email or web address] for contact info under        Signed, Newman JINNY Lawrence, MD   "

## 2024-06-23 NOTE — TOC Initial Note (Signed)
 Transition of Care [facility]) - Initial/Assessment Note    Patient Details  Name: [name] MRN: [id number] Date of Birth: October 03, 1940  Transition of Care [facility]) CM/SW Contact:    Inocente GORMAN Kindle, LCSW Phone Number: 06/23/2024, 2:07 PM  Clinical Narrative:                 Patient admitted from [facility] for rehab. Per Idell who came by from [facility], patient will require insurance approval prior to return. Will continue to follow.   Expected Discharge Plan: Skilled Nursing Facility Barriers to Discharge: English As A Second Language Teacher, Continued Medical Work up   Patient Goals and CMS Choice Patient states their goals for this hospitalization and ongoing recovery are:: Return to rehab          Expected Discharge Plan and Services In-house Referral: Clinical Social Work   Post Acute Care Choice: Skilled Nursing Facility Living arrangements for the past 2 months: Single Family Home                                      Prior Living Arrangements/Services Living arrangements for the past 2 months: Single Family Home Lives with:: Spouse Patient language and need for interpreter reviewed:: Yes Do you feel safe going back to the place where you live?: Yes      Need for Family Participation in Patient Care: Yes (Comment) Care giver support system in place?: Yes (comment) Current home services: DME (rw,3n1,active w/[employer] home 02) Criminal Activity/Legal Involvement Pertinent to Current Situation/Hospitalization: No - Comment as needed  Activities of Daily Living      Permission Sought/Granted Permission sought to share information with : Facility Medical Sales Representative, Family Supports Permission granted to share information with : Yes, Verbal Permission Granted  Share Information with NAME: Kaelen, Brennan [phone]  [phone]  Permission granted to share info w AGENCY: [name]        Emotional Assessment Appearance:: Appears stated age    Affect (typically observed): Accepting Orientation: : Oriented to Self, Oriented to Place, Oriented to  Time, Oriented to Situation Alcohol  / Substance Use: Not Applicable Psych Involvement: No (comment)  Admission diagnosis:  Pericardial effusion [I31.39] Respiratory distress [R06.03] SOB (shortness of breath) [R06.02] Patient Active Problem List   Diagnosis Date Noted   Pleural effusion 06/23/2024   SOB (shortness of breath) 06/21/2024   Respiratory distress 06/21/2024   Acute on chronic hypoxic respiratory failure (HCC) 06/21/2024   Acute on chronic diastolic (congestive) heart failure (HCC) 06/21/2024   Generalized weakness 06/13/2024   Pericardial effusion 06/13/2024   Warfarin-induced coagulopathy 06/13/2024   Hyperkalemia 06/13/2024   AKI (acute kidney injury) 06/10/2024   Atrial fibrillation with RVR (HCC) 05/12/2024   Atypical chest pain 05/12/2024   Acute on chronic heart failure with preserved ejection fraction (HFpEF) (HCC) 09/26/2022   Permanent atrial fibrillation (HCC) 09/26/2022   Warfarin anticoagulation 09/18/2022   Sepsis (HCC) 09/18/2022   Sepsis due to cellulitis (HCC) 09/18/2022   Lower extremity cellulitis 09/18/2022   Chronic intermittent hypoxia with obstructive sleep apnea 09/12/2021   Severe obstructive sleep apnea-hypopnea syndrome 09/12/2021   Acute medial meniscus tear of right knee 03/31/2018   Acute pain of right knee 01/12/2018   Hypercoagulation syndrome 05/24/2014   [place]unter's glossitis 05/24/2014   Sarcoidosis 05/24/2014   Hypoxemia 05/24/2014   Primary hypertension 10/13/2013   Nocturnal hypoxia 05/19/2013   Obstructive sleep apnea (adult) (pediatric) 05/19/2013  Obesity 05/19/2013   OSA on CPAP    Diffuse lymphadenopathy 05/26/2012   Cough 05/20/2012   OSA (obstructive sleep apnea) 05/20/2012   Pulmonary embolism (HCC) 04/29/2012   DVT (deep venous thrombosis) (HCC) 04/29/2012   PCP:  [name], MD Pharmacy:   [facility] [address] [place] - [address] [place] [facility] [address] Phone: [phone] Fax: [phone]     Social Drivers of Health (SDOH) Social History: SDOH Screenings   Food Insecurity: No Food Insecurity (06/22/2024)  Housing: Low Risk (06/22/2024)  Transportation Needs: No Transportation Needs (06/22/2024)  Utilities: Not At Risk (06/22/2024)  Depression (PHQ2-9): Low Risk (05/27/2024)  Social Connections: Moderately Integrated (06/22/2024)  Tobacco Use: Low Risk (06/21/2024)   SDOH Interventions:     Readmission Risk Interventions    06/13/2024   11:12 AM 09/19/2022    2:13 PM  Readmission Risk Prevention Plan  Post Dischage Appt  Complete  Medication Screening  Complete  Transportation Screening Complete   PCP or Specialist Appt within 3-5 Days Complete   HRI or Home Care Consult Complete   Social Work Consult for Recovery Care Planning/Counseling Complete   Palliative Care Screening Not Applicable   Medication Review Oceanographer) Complete

## 2024-06-23 NOTE — Progress Notes (Signed)
 PHARMACY - ANTICOAGULATION CONSULT NOTE  Pharmacy Consult for warfarin Indication: atrial fibrillation  Allergies[1]  Patient Measurements: Height: 5' 4 (162.6 cm) Weight: 102.1 kg (225 lb) IBW/kg (Calculated) : 54.7 HEPARIN DW (KG): 78.5  Vital Signs: Temp: 97.9 F (36.6 C) (01/15 0800) Temp Source: Oral (01/15 0800) BP: 125/76 (01/15 0508) Pulse Rate: 75 (01/15 0508)  Labs: Recent Labs    06/21/24 1237 06/22/24 0404 06/23/24 0300 06/23/24 0740  HGB 10.7* 10.1*  --  10.3*  HCT 33.8* 32.6*  --  31.8*  PLT 175 156  --  109*  LABPROT 28.7* 36.6* 39.7*  --   INR 2.6* 3.5* 3.9*  --   CREATININE 0.94 0.82  --  0.68    Estimated Creatinine Clearance: 59.8 mL/min (by C-G formula based on SCr of 0.68 mg/dL).   Medical History: Past Medical History:  Diagnosis Date   Anxiety    takes Lorazepam  daily as needed   Arthritis    Cancer (HCC)    thyroid    Cataracts, bilateral    immature   Chronic back pain    compressed vertebrae,takes Fosamax weekly   Family history of adverse reaction to anesthesia    granddaughter gets very sick and mean   History of blood transfusion    no abnormal reaction noted   History of bronchitis 05/2015   History of colon polyps    benign   Hyperlipidemia    takes Zetia  daily   Hypertension    takes Diltiazem  and Avapro  daily   Hypothyroidism    takes Synthroid  daily   Ischemic colitis    hx of-many yrs ago   Joint pain    Joint swelling    OSA on CPAP    05-08-12 AHi was 46, RDI  53. titrated to   9 cm water   with 3 cm flex.-nadir 75%   PE (pulmonary embolism)    takes Coumadin  daily   Peripheral edema    Pneumonia 1992   hx of   Sarcoidosis    Shortness of breath    Urinary frequency    Weakness    left leg.Numbness and tingling.Has sciatica   Wheeze    occaionally. Not new    Medications:  Medications Prior to Admission  Medication Sig Dispense Refill Last Dose/Taking   acetaminophen  (TYLENOL ) 325 MG tablet Take  325-650 mg by mouth 2 (two) times daily as needed for mild pain or headache.   Past Week   bisacodyl (DULCOLAX) 10 MG suppository Place 10 mg rectally daily as needed for moderate constipation.   Unknown   Carboxymethylcellulose Sodium (REFRESH LIQUIGEL OP) Place 1 drop into both eyes 2 (two) times daily as needed (Dry eye).   Unknown   carvedilol  (COREG ) 3.125 MG tablet Take 1 tablet (3.125 mg total) by mouth 2 (two) times daily with a meal. 60 tablet 0 06/21/2024 Morning   cyanocobalamin (VITAMIN B12) 1000 MCG tablet Take 1,000 mcg by mouth daily.   06/21/2024   diltiazem  (CARDIZEM ) 120 MG tablet Take 1 tablet (120 mg total) by mouth every 8 (eight) hours. 90 tablet 0 06/21/2024 Morning   furosemide  (LASIX ) 40 MG tablet Take 40 mg by mouth daily.   06/21/2024 Morning   levothyroxine  (SYNTHROID ) 112 MCG tablet Take 112 mcg by mouth every morning.   06/21/2024 Morning   LORazepam  (ATIVAN ) 1 MG tablet Take 1.5 tablets (1.5 mg total) by mouth See admin instructions. Take 1.5 mg by mouth at bedtime and an additional 1-1.5mg  once a day  as needed for anxiety (Patient taking differently: Take 1-1.5 mg by mouth See admin instructions. Take 1.5 mg by mouth at bedtime.  PRN order: take 1 mg by mouth every 24 hours as needed for anxiety.) 10 tablet 0 06/21/2024   magnesium  hydroxide (MILK OF MAGNESIA) 400 MG/5ML suspension Take 30 mLs by mouth daily as needed for mild constipation or moderate constipation.   Unknown   melatonin 5 MG TABS Take 1 tablet (5 mg total) by mouth at bedtime as needed (if lorazepam  doesn't work). (Patient taking differently: Take 5 mg by mouth at bedtime as needed (insomnia).)   Unknown   polyethylene glycol (MIRALAX  / GLYCOLAX ) 17 g packet Take 17 g by mouth daily as needed for mild constipation. 14 each 0 Unknown   senna-docusate (SENOKOT-S) 8.6-50 MG tablet Take 1-2 tablets by mouth 2 (two) times daily between meals as needed for mild constipation or moderate constipation. (Patient taking  differently: Take 2 tablets by mouth 2 (two) times daily as needed for mild constipation or moderate constipation.)   Past Week   SIMPLY SALINE NA Place 1 spray into both nostrils every 12 (twelve) hours as needed (for dryness).   Past Week   sodium chloride  1 g tablet Take 1 tablet (1 g total) by mouth 3 (three) times daily with meals. 30 tablet 0 06/21/2024 Morning   Sodium Phosphates (ENEMA RE) Place 1 Dose rectally daily as needed (constipation).   Unknown   warfarin (COUMADIN ) 5 MG tablet Take 5 mg by mouth at bedtime.  2 06/20/2024 at 10:00 PM   diltiazem  (CARDIZEM  CD) 360 MG 24 hr capsule Take 360 mg by mouth at bedtime. (Patient not taking: Reported on 06/21/2024)   Not Taking   irbesartan  (AVAPRO ) 75 MG tablet Take 75 mg by mouth daily. (Patient not taking: Reported on 06/21/2024)   Not Taking   nebivolol  (BYSTOLIC ) 5 MG tablet Take 5 mg by mouth daily. (Patient not taking: Reported on 06/21/2024)   Not Taking   nystatin  cream (MYCOSTATIN ) Apply 1 Application topically 2 (two) times daily as needed for dry skin. (Patient not taking: Reported on 06/21/2024)   Not Taking   triamcinolone cream (KENALOG) 0.1 % Apply 1 Application topically 2 (two) times daily as needed (Irritation). (Patient not taking: Reported on 06/21/2024)   Not Taking    Assessment: [name] is a 84 y.o. year old female presented on 06/21/2024 with concern for shob found to have aspiration pneumonia, pericardial effusion, and pleural effusion. On warfarin prior to admission for afib and hx of PE. Prior to admission regimen is warfarin 5mg  PO daily. Last dose prior to admission 1/12 2200. Pharmacy consulted for warfarin inpatient management.   Date INR Warfarin Dose  1/12 2.2, therapeutic  5mg  (pta)   1/13 2.6, therapeutic  5mg    1/14 3.5, supra-therapeutic    1/15 3.9, supra-therapeutic    New drug-drug interactions: unasyn  (might contribute to increase in INR) Bleeding assessment: CBC stable, no bleeding  noted  Of note, pt currently NPO with aspiration precautions   Goal of Therapy:  INR 2-3 Monitor platelets by anticoagulation protocol: Yes   Plan:  Hold warfarin today  Daily PT/INR and CBC Assess for signs/sx bleeding and drug-drug interactions  Thank you for allowing pharmacy to participate in this patient's care.  [employer], Pharm.D., BCPS Clinical Pharmacist Clinical phone for 06/23/2024 from 7:30-3:00 is [phone].  **Pharmacist phone directory can be found on [email or web address] listed under [facility].  06/23/2024 8:59 AM           [  1]  Allergies Allergen Reactions   Atenolol Other (See Comments)    Depression    Citalopram Hydrobromide Nausea Only and Other (See Comments)    Nausea/Fatigue   Clindamycin /Lincomycin Other (See Comments)    Via IV, has caused a metallic taste in the mouth    Codeine Other (See Comments)    Caused Hyperactivity and Dysphoria   Coenzyme Q10 Itching   Erythromycin Itching, Anxiety and Other (See Comments)    Also with jitters   Motrin [Ibuprofen] Other (See Comments)    Is to not take this   Statins Other (See Comments)    Leg muscle weakness w/ rosuvastatin and simvastatin    Latex Rash   Losartan Potassium Other (See Comments)    Muscle aches

## 2024-06-23 NOTE — Significant Event (Signed)
 Patient noted to getting more short of breath.  Presently on 10 L high flow oxygen  on exam at bedside patient is tachypneic.  Ordered a stat chest x-ray which showed large pleural effusion and moderate right-sided pleural effusion with hazy opacification.  I reviewed patient's labs notes and medications.  Blood pressure is 117/56 pulse is 75/min temperature 97.6. HEENT anicteric no pallor. Chest bilateral drainage present. Heart S1-S2 heard. Abdomen soft nontender Neuro alert awake oriented. Extremities compression stockings.  Assessment and plan - Acute respiratory failure with hypoxia presently on 10 L high flow oxygen  likely from fluid and possible aspiration.  I ordered extra dose of Lasix  40 mg IV and ordered another dose just now at 6:10 AM.  Morning labs are pending.  Will get a CT scan of the chest patient likely may need thoracentesis given the large pleural effusion and hypoxia.  Cardiology evaluating for possible constrictive effusive pericarditis.  Patient is on Coumadin  but may have to transition to heparin based on the CT findings if thoracentesis is planned..  Patient is already on antibiotics for aspiration.  Redia Cleaver

## 2024-06-24 ENCOUNTER — Inpatient Hospital Stay (HOSPITAL_COMMUNITY)

## 2024-06-24 DIAGNOSIS — R0602 Shortness of breath: Secondary | ICD-10-CM | POA: Diagnosis not present

## 2024-06-24 LAB — CBC WITH DIFFERENTIAL/PLATELET
Abs Immature Granulocytes: 0.02 K/uL (ref 0.00–0.07)
Basophils Absolute: 0 K/uL (ref 0.0–0.1)
Basophils Relative: 0 %
Eosinophils Absolute: 0.1 K/uL (ref 0.0–0.5)
Eosinophils Relative: 1 %
HCT: 30.2 % — ABNORMAL LOW (ref 36.0–46.0)
Hemoglobin: 9.7 g/dL — ABNORMAL LOW (ref 12.0–15.0)
Immature Granulocytes: 0 %
Lymphocytes Relative: 17 %
Lymphs Abs: 1.3 K/uL (ref 0.7–4.0)
MCH: 29.8 pg (ref 26.0–34.0)
MCHC: 32.1 g/dL (ref 30.0–36.0)
MCV: 92.6 fL (ref 80.0–100.0)
Monocytes Absolute: 1.6 K/uL — ABNORMAL HIGH (ref 0.1–1.0)
Monocytes Relative: 22 %
Neutro Abs: 4.6 K/uL (ref 1.7–7.7)
Neutrophils Relative %: 60 %
Platelets: 103 K/uL — ABNORMAL LOW (ref 150–400)
RBC: 3.26 MIL/uL — ABNORMAL LOW (ref 3.87–5.11)
RDW: 16.5 % — ABNORMAL HIGH (ref 11.5–15.5)
WBC: 7.6 K/uL (ref 4.0–10.5)
nRBC: 0 % (ref 0.0–0.2)

## 2024-06-24 LAB — PROTEIN, BODY FLUID (OTHER): Total Protein, Body Fluid Other: 3.4 g/dL

## 2024-06-24 LAB — PHOSPHORUS: Phosphorus: 2.1 mg/dL — ABNORMAL LOW (ref 2.5–4.6)

## 2024-06-24 LAB — BASIC METABOLIC PANEL WITH GFR
Anion gap: 8 (ref 5–15)
BUN: 17 mg/dL (ref 8–23)
CO2: 39 mmol/L — ABNORMAL HIGH (ref 22–32)
Calcium: 8.3 mg/dL — ABNORMAL LOW (ref 8.9–10.3)
Chloride: 93 mmol/L — ABNORMAL LOW (ref 98–111)
Creatinine, Ser: 0.64 mg/dL (ref 0.44–1.00)
GFR, Estimated: >60 mL/min
Glucose, Bld: 86 mg/dL (ref 70–99)
Potassium: 3.4 mmol/L — ABNORMAL LOW (ref 3.5–5.1)
Sodium: 140 mmol/L (ref 135–145)

## 2024-06-24 LAB — CYTOLOGY - NON PAP

## 2024-06-24 LAB — GLUCOSE, CAPILLARY
Glucose-Capillary: 128 mg/dL — ABNORMAL HIGH (ref 70–99)
Glucose-Capillary: 136 mg/dL — ABNORMAL HIGH (ref 70–99)
Glucose-Capillary: 83 mg/dL (ref 70–99)
Glucose-Capillary: 88 mg/dL (ref 70–99)
Glucose-Capillary: 95 mg/dL (ref 70–99)

## 2024-06-24 LAB — PROTIME-INR
INR: 3.3 — ABNORMAL HIGH (ref 0.8–1.2)
Prothrombin Time: 35 s — ABNORMAL HIGH (ref 11.4–15.2)

## 2024-06-24 LAB — LD, BODY FLUID (OTHER): LD, Body Fluid: 302 IU/L

## 2024-06-24 LAB — MAGNESIUM: Magnesium: 1.9 mg/dL (ref 1.7–2.4)

## 2024-06-24 MED ORDER — PANTOPRAZOLE SODIUM 40 MG PO TBEC
40.0000 mg | DELAYED_RELEASE_TABLET | Freq: Every day | ORAL | Status: DC
  Administered 2024-06-25 – 2024-07-01 (×7): 40 mg via ORAL
  Filled 2024-06-24 (×7): qty 1

## 2024-06-24 MED ORDER — POTASSIUM CHLORIDE CRYS ER 20 MEQ PO TBCR
40.0000 meq | EXTENDED_RELEASE_TABLET | Freq: Once | ORAL | Status: AC
  Administered 2024-06-24: 40 meq via ORAL
  Filled 2024-06-24: qty 2

## 2024-06-24 MED ORDER — MAGNESIUM SULFATE 2 GM/50ML IV SOLN
2.0000 g | Freq: Once | INTRAVENOUS | Status: AC
  Administered 2024-06-24: 2 g via INTRAVENOUS
  Filled 2024-06-24: qty 50

## 2024-06-24 MED ORDER — POLYVINYL ALCOHOL 1.4 % OP SOLN
1.0000 [drp] | OPHTHALMIC | Status: DC | PRN
  Administered 2024-06-24: 1 [drp] via OPHTHALMIC
  Filled 2024-06-24: qty 15

## 2024-06-24 MED ORDER — POTASSIUM PHOSPHATES 15 MMOLE/5ML IV SOLN
30.0000 mmol | Freq: Once | INTRAVENOUS | Status: AC
  Administered 2024-06-24: 30 mmol via INTRAVENOUS
  Filled 2024-06-24: qty 10

## 2024-06-24 NOTE — Progress Notes (Signed)
 Physical Therapy Treatment Patient Details Name: [name] MRN: [id number] DOB: 1941/05/15 Today's Date: 06/24/2024   History of Present Illness Pt is 84 year old presented to [facility] on  06/21/24 for SOB. Was just discharged from [facility] to [facility] on 06/20/24. At [facility] pt with bacteremia and pericardial effusion. Pt now with acute hypoxic respiratory failure, pleural effusion, and acute on chronic CHF.  PMH - CHF, HTN, arthritis, anxiety, thyroid  CA, afib, PE, DM, OSA    PT Comments  Pt demonstrates improved tolerance to activity, however, is limited by fatigue and SOB throughout. Pt requires additional time to complete bed mobility and pauses briefly halfway through transition to manage SOB. Pt requires minA for STS and step pivot to chair, taking unsteady, shuffled steps to achieve transfer. Pt progressed to CGA for additional STS attempts. Pt tolerates multiple short bouts of forward/backward ambulation with one seated rest break taken. Pt on 3L O2 throughout. Pt motivated but limited by medical status at this time. Pt would benefit from continued PT services focused on transfers, balance, gait, and breathing strategies to promote tolerance and improved independence during functional mobility.   If plan is discharge home, recommend the following: A little help with walking and/or transfers;A lot of help with bathing/dressing/bathroom;Assistance with cooking/housework;Assist for transportation;Help with stairs or ramp for entrance   Can travel by private vehicle     No (Oxygen  requirements)  Equipment Recommendations  None recommended by PT (Pt with appropriate equipment at home)    Recommendations for Other Services       Precautions / Restrictions Precautions Precautions: Fall;Other (comment) Recall of Precautions/Restrictions: Intact Precaution/Restrictions Comments: monitor O2 Restrictions Weight Bearing Restrictions Per Provider Order: No     Mobility  Bed  Mobility Overal bed mobility: Needs Assistance Bed Mobility: Supine to Sit     Supine to sit: Supervision, HOB elevated     General bed mobility comments: Supervision to come to EOB and use of bed rails. Requires additional time for transfer, takes break once partially EOB due to SOB.    Transfers Overall transfer level: Needs assistance Equipment used: Rolling walker (2 wheels) Transfers: Sit to/from Stand, Bed to chair/wheelchair/BSC Sit to Stand: Contact guard assist, Min assist   Step pivot transfers: Contact guard assist, Min assist       General transfer comment: MinA required for initial STS but improved to CGA with additional attempts. Pt takes slow, unsteady steps towards recliner. Cues for positioning and hand placement.    Ambulation/Gait Ambulation/Gait assistance: Contact guard assist Gait Distance (Feet): 5 Feet (5'x8) Assistive device: Rolling walker (2 wheels) Gait Pattern/deviations: Step-through pattern, Decreased stride length, Shuffle, Narrow base of support   Gait velocity interpretation: <1.31 ft/sec, indicative of household ambulator   General Gait Details: Pt tolerates multiple bouts of short distance ambulation, limited by O2 tubing. Takes one seated rest break halfway through attempts. Steps forwards and backwards, good management of walker.   Stairs             Wheelchair Mobility     Tilt Bed    Modified Rankin (Stroke Patients Only)       Balance Overall balance assessment: Needs assistance Sitting-balance support: No upper extremity supported, Feet supported Sitting balance-Leahy Scale: Fair Sitting balance - Comments: Balance improves with feet supported on floor. No trunk lean noted.   Standing balance support: Bilateral upper extremity supported, During functional activity, Reliant on assistive device for balance Standing balance-Leahy Scale: Poor Standing balance comment: Dependent on  RW. No knee buckling or LOB noted.                             Communication Communication Communication: Impaired Factors Affecting Communication: Hearing impaired  Cognition Arousal: Alert Behavior During Therapy: WFL for tasks assessed/performed   PT - Cognitive impairments: No apparent impairments                         Following commands: Intact      Cueing Cueing Techniques: Verbal cues, Tactile cues, Visual cues  Exercises      General Comments        Pertinent Vitals/Pain Pain Assessment Pain Assessment: Faces Faces Pain Scale: Hurts a little bit Pain Location: Bottom Pain Descriptors / Indicators: Aching, Sore Pain Intervention(s): Repositioned, Limited activity within patient's tolerance, Monitored during session    Home Living                          Prior Function            PT Goals (current goals can now be found in the care plan section) Acute Rehab PT Goals Patient Stated Goal: return home PT Goal Formulation: With patient/family Time For Goal Achievement: 07/06/24 Potential to Achieve Goals: Good Progress towards PT goals: Progressing toward goals    Frequency    Min 2X/week      PT Plan      Co-evaluation              AM-PAC PT 6 Clicks Mobility   Outcome Measure  Help needed turning from your back to your side while in a flat bed without using bedrails?: None Help needed moving from lying on your back to sitting on the side of a flat bed without using bedrails?: A Little Help needed moving to and from a bed to a chair (including a wheelchair)?: A Little Help needed standing up from a chair using your arms (e.g., wheelchair or bedside chair)?: A Little Help needed to walk in hospital room?: A Lot Help needed climbing 3-5 steps with a railing? : Total 6 Click Score: 16    End of Session Equipment Utilized During Treatment: Gait belt;Oxygen  Activity Tolerance: Patient limited by fatigue (Patient limited by SOB) Patient left:  in chair;with call bell/phone within reach;with family/visitor present Nurse Communication: Mobility status PT Visit Diagnosis: Unsteadiness on feet (R26.81);Muscle weakness (generalized) (M62.81)     Time: 8654-8587 PT Time Calculation (min) (ACUTE ONLY): 27 min  Charges:    $Gait Training: 23-37 mins PT General Charges $$ ACUTE PT VISIT: 1 Visit                     [name], PT, DPT  Acute Rehabilitation Services         Office: [phone]      [name] 06/24/2024, 3:31 PM

## 2024-06-24 NOTE — Plan of Care (Signed)

## 2024-06-24 NOTE — Progress Notes (Signed)
 "                                                                                                                                                                                                                                                                                PROGRESS NOTE     Patient Demographics:    [name], is a 84 y.o. female, DOB - September 12, 1940, FMW:[id number]  Outpatient Primary MD for the patient is [name], MD    LOS - 3  Admit date - 06/21/2024    Chief Complaint  Patient presents with   Shortness of Breath       Brief Narrative (HPI from H&P)   84 y.o. female with past medical history  of   thyroid  cancer, hyperlipidemia, hypertension, hypothyroid, anxiety, arthritis, OSA on CPAP, and history of sarcoidosis Most recently she was admitted from June 10, 2024 with shock, Staphylococcus epidermidis bacteremia, acute kidney injury, hyperkalemia, and moderate pericardial effusion and discharged June 20, 2024, presented back to ED with shortness of breath.   During recent hospitalization, She was initially admitted to the ICU ,received fresh frozen plasma, vitamin K , lokelma , bicarbonate infusion, and empiric antibiotics. Echocardiography revealed an ejection fraction of 45-50% with a large pericardial effusion without tamponade. Pt had S. epidermidis bacteremia / completed 7 days of IV linezolid .  Patient was seen by cardiology on January 7 for her pericardial effusion with no tamponade on echo with plan for recheck echocardiogram.  Patient was discharged on 5 L of oxygen .  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  Diagnosed with acute hypoxic respiratory failure.  Transferred to my care on 06/23/2024.    Subjective:   Patient in bed, appears comfortable,  denies any headache, no fever, no chest pain or pressure, much improved shortness of breath , no abdominal pain. No focal weakness.   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  Patient currently on 10  L nasal cannula oxygen , has been seen by cardiology and pulmonary, left-sided pleural effusion has progressed considerably hence IR requested to do ultrasound-guided therapeutic and diagnostic thoracentesis on 06/23/2024, continue supportive care, CTA was nonacute without any PE.  There is a question of aspiration pneumonia for which  She is on Unasyn , clinically chances, will discontinue antibiotics on 06/24/2024.  Continue diuresis as tolerated with IV diuretics.  She is much improved after left-sided thoracentesis on 06/23/2024 with 900 cc of fluid removed and ongoing diuresis.  Pleural fluid chemistry pending.    Permanent atrial fibrillation/history of PE: Rate controlled.  Continue Coreg  and Cardizem .  On Coumadin , INR slightly supratherapeutic.  Pharmacy managing Coumadin  dose/heparin  once INR is subtherapeutic in case cardiology wants to do a pericardiocentesis.  Lab Results  Component Value Date   INR 3.3 (H) 06/24/2024   INR 3.9 (H) 06/23/2024   INR 3.5 (H) 06/22/2024   PROTIME 25.2 (H) 11/29/2014   Hypertension: Blood pressure controlled.  Continue Cardizem , holding home dose of Avapro .   GERD:   on PPI.   Hypokalemia, hypophosphatemia.  Replaced.    Acquired hypothyroidism: Continue Synthroid .   OSA: Nightly CPAP ordered.   Anemia of chronic disease: Stable.   Obesity.  BMI 38.  Follow-up with PCP.  Type 2 diabetes mellitus:   Not on any medications PTA.  Currently on SSI.  Lab Results  Component Value Date   HGBA1C 7.6 (H) 06/10/2024   CBG (last 3)  Recent Labs    06/24/24 0002 06/24/24 0430 06/24/24 0819  GLUCAP  [phone]        Condition - Extremely Guarded  Family Communication  : Husband [name] [phone]  called on 06/23/2024 at 8:07 AM  Code Status : Full code  Consults  : IR, cardiology, pulmonary  PUD Prophylaxis :     Procedures  :     Left ultrasound-guided thoracentesis by IR on 06/23/2024.  900 cc fluid removed, chemistries pending  CTA.  1. No evidence of pulmonary embolism. 2. Small to moderate size right pleural effusion with associated basilar atelectasis. Small left pleural effusion with associated basilar atelectasis. 3. Subtle hazy density over the perihilar region of the left upper lobe possibly due to minimal asymmetric edema and less likely early infection. 4. Mild stable cardiomegaly with interval decrease in pericardial effusion. Atherosclerotic coronary artery disease. 5. Aortic atherosclerosis. 6. Evidence of old granulomatous disease. Aortic Atherosclerosis (ICD10-I70.0).  Cardiac MRI.  1. There is a moderate pericardial effusion. The inferior vena cava is dilated with reduced respiratory collapse, consistent with elevated right sided filling pressures. In diastole, the left ventricle has a D shaped configuration with accentuated respiratory variation in septal position (septal bounce) on strain imaging and with free breathing, indicating ventricular interdependence. This is seen during inspiration and can be seen in constrictive physiology. 2. Normal left ventricular systolic function (LVEF =61%). 3. Decreased right ventricular systolic function (RVEF =38%). 4. There are bilateral pleural effusions with secondary atelectasis  Echocardiogram -  1. Moderate pericardial effusion which is more prominent up to 2.0 cm posterior to the LV (best seen subcostal; image 49). The RV is poorly visualized, but does not appear to collapse in diastole. The IVC is dilated. There is evidence of respiratory variation in septal movement on image 41. Findings are suggestive of a clinically  significant pericardial effusion. Clinical correlation is recommended. Appearance is stable compared to prior study. Moderate pericardial effusion. The pericardial effusion  is circumferential.  2. Right ventricular systolic  function was not well visualized. The right ventricular size is not well visualized. Tricuspid regurgitation signal is inadequate for assessing PA pressure.  3. The mitral valve is grossly normal. Trivial mitral valve regurgitation. No evidence of mitral stenosis.  4. The aortic valve is tricuspid. Aortic valve regurgitation is not visualized. Aortic valve sclerosis is present, with no evidence of aortic valve stenosis.  5. The inferior vena cava is dilated in size with <50% respiratory variability, suggesting right atrial pressure of 15 mmHg.  6. Left ventricular ejection fraction, by estimation, is 60 to 65%. The left ventricle has normal function. Left ventricular endocardial border not optimally defined to evaluate regional wall motion. Comparison(s): No significant change from prior study.      Disposition Plan  :    Status is: Inpatient PPI  DVT Prophylaxis  : Coumadin   Lab Results  Component Value Date   INR 3.3 (H) 06/24/2024   INR 3.9 (H) 06/23/2024   INR 3.5 (H) 06/22/2024   PROTIME 25.2 (H) 11/29/2014     Lab Results  Component Value Date   PLT 103 (L) 06/24/2024    Diet :  Diet Order             Diet NPO time specified Except for: Sips with Meds  Diet effective midnight                    Inpatient Medications  Scheduled Meds:  carvedilol   3.125 mg Oral BID WC   diltiazem   120 mg Oral Q8H   furosemide   40 mg Intravenous BID   insulin  aspart  0-9 Units Subcutaneous Q4H   lactose free nutrition  237 mL Oral BID WC   levothyroxine   112 mcg Oral q morning   pantoprazole  (PROTONIX ) IV  40 mg Intravenous Q24H   potassium chloride   40 mEq Oral Once   sodium chloride  flush  3 mL Intravenous Q12H   Continuous Infusions:  ampicillin -sulbactam  (UNASYN ) IV 3 g (06/24/24 0855)   magnesium  sulfate bolus IVPB     potassium PHOSPHATE  IVPB (in mmol)     PRN Meds:.acetaminophen  **OR** acetaminophen , alum & mag hydroxide-simeth, artificial tears, guaiFENesin , LORazepam , ondansetron  **OR** ondansetron  (ZOFRAN ) IV, polyethylene glycol     Objective:   Vitals:   06/24/24 0000 06/24/24 0400 06/24/24 0653 06/24/24 0800  BP: 124/78 (!) 105/59 108/61 119/67  Pulse: 72 81 86 71  Resp: (!) 23 (!) 21 (!) 22 (!) 21  Temp: 97.7 F (36.5 C) 97.7 F (36.5 C)  (!) 97.5 F (36.4 C)  TempSrc: Oral Oral  Oral  SpO2: 93% 94% 98% 98%  Weight:      Height:        Wt Readings from Last 3 Encounters:  06/21/24 102.1 kg  06/20/24 108.7 kg  05/27/24 109.3 kg     Intake/Output Summary (Last 24 hours) at 06/24/2024 1009 Last data filed at 06/24/2024 0429 Gross per 24 hour  Intake 492 ml  Output 800 ml  Net -308 ml     Physical Exam  Awake Alert, No new F.N deficits, in mild respiratory distress [place].AT,PERRAL Supple Neck, No JVD,   Symmetrical Chest wall movement, Good air movement bilaterally, reduced bibasilar breath sounds left more reduced than right RRR,No Gallops,Rubs or new Murmurs,  +ve B.Sounds, Abd Soft, No tenderness,   1+ chronic bilateral lower extremity edema     RN pressure injury documentation: Wound 06/22/24 1541 Pressure Injury Sacrum Medial Stage 2 -  Partial thickness loss  of dermis presenting as a shallow open injury with a red, pink wound bed without slough. (Active)      Data Review:    Recent Labs  Lab 06/19/24 0514 06/21/24 1237 06/22/24 0404 06/23/24 0740 06/24/24 0411  WBC 7.9 10.9* 10.0 8.4 7.6  HGB 10.8* 10.7* 10.1* 10.3* 9.7*  HCT 32.6* 33.8* 32.6* 31.8* 30.2*  PLT 217 175 156 109* 103*  MCV 91.1 93.1 93.4 91.6 92.6  MCH 30.2 29.5 28.9 29.7 29.8  MCHC 33.1 31.7 31.0 32.4 32.1  RDW 15.9* 16.4* 16.4* 16.5* 16.5*  LYMPHSABS 1.2 0.7 0.9 1.0 1.3  MONOABS 0.8 0.9 1.0 1.2* 1.6*  EOSABS 0.1 0.1  0.1 0.1 0.1  BASOSABS 0.0 0.0 0.0 0.0 0.0    Recent Labs  Lab 06/19/24 0514 06/20/24 0447 06/21/24 1237 06/21/24 1501 06/21/24 2223 06/22/24 0404 06/23/24 0300 06/23/24 0740 06/24/24 0411  NA 126* 128* 133*  --   --  137  --  139 140  K 4.3 4.1 4.2  --   --  3.8  --  3.4* 3.4*  CL 89* 90* 93*  --   --  95*  --  91* 93*  CO2 28 29 32  --   --  33*  --  38* 39*  ANIONGAP 9 9 9   --   --  8  --  10 8  GLUCOSE 106* 87 134*  --   --  106*  --  95 86  BUN 27* 25* 22  --   --  20  --  16 17  CREATININE 1.23* 1.01* 0.94  --   --  0.82  --  0.68 0.64  AST  --   --  26  --   --   --   --  29  --   ALT  --   --  16  --   --   --   --  19  --   ALKPHOS  --   --  154*  --   --   --   --  147*  --   BILITOT  --   --  0.6  --   --   --   --  0.6  --   ALBUMIN  --   --  2.8*  --   --   --   --  2.8*  --   CRP  --   --   --   --   --   --   --  7.6*  --   PROCALCITON  --   --   --   --   --   --   --  <0.10  --   LATICACIDVEN  --   --   --  2.7* 1.8  --   --   --   --   INR 1.9* 2.2* 2.6*  --   --  3.5* 3.9*  --  3.3*  MG 2.1 1.9  --   --   --  1.9  --  1.7 1.9  PHOS  --   --   --   --   --   --   --   --  2.1*  CALCIUM  8.7* 8.5* 8.3*  --   --  8.3*  --  8.4* 8.3*      Recent Labs  Lab 06/19/24 0514 06/20/24 0447 06/21/24 1237 06/21/24 1501 06/21/24 2223 06/22/24 0404 06/23/24 0300 06/23/24 0740 06/24/24 0411  CRP  --   --   --   --   --   --   --  7.6*  --   PROCALCITON  --   --   --   --   --   --   --  <0.10  --   LATICACIDVEN  --   --   --  2.7* 1.8  --   --   --   --   INR 1.9* 2.2* 2.6*  --   --  3.5* 3.9*  --  3.3*  MG 2.1 1.9  --   --   --  1.9  --  1.7 1.9  CALCIUM  8.7* 8.5* 8.3*  --   --  8.3*  --  8.4* 8.3*    --------------------------------------------------------------------------------------------------------------- Lab Results  Component Value Date   CHOL 242 (H) 01/12/2023   HDL 62 01/12/2023   LDLCALC 157 (H) 01/12/2023   TRIG 130 01/12/2023   CHOLHDL  3.9 01/12/2023    Lab Results  Component Value Date   HGBA1C 7.6 (H) 06/10/2024   No results for input(s): TSH, T4TOTAL, FREET4, T3FREE, THYROIDAB in the last 72 hours. No results for input(s): VITAMINB12, FOLATE, FERRITIN, TIBC, IRON, RETICCTPCT in the last 72 hours. ------------------------------------------------------------------------------------------------------------------ Cardiac Enzymes No results for input(s): CKMB, TROPONINI, MYOGLOBIN in the last 168 hours.  Invalid input(s): CK  Micro Results Recent Results (from the past 240 hours)  Culture, blood (Routine X 2) w Reflex to ID Panel     Status: None   Collection Time: 06/16/24  2:22 PM   Specimen: BLOOD LEFT HAND  Result Value Ref Range Status   Specimen Description   Final    BLOOD LEFT HAND Performed at [facility], [address], [place], [place] [address]    Special Requests   Final    BOTTLES DRAWN AEROBIC AND ANAEROBIC Blood Culture adequate volume Performed at [facility], [address], [place], [place] [address]    Culture   Final    NO GROWTH 5 DAYS Performed at [facility], [address] [place], [place] [address]    Report Status 06/21/2024 FINAL  Final  Culture, blood (Routine X 2) w Reflex to ID Panel     Status: None   Collection Time: 06/16/24  2:27 PM   Specimen: BLOOD LEFT HAND  Result Value Ref Range Status   Specimen Description   Final    BLOOD LEFT HAND Performed at [facility], [address], [place], [place] [address]    Special Requests   Final    BOTTLES DRAWN AEROBIC AND ANAEROBIC Blood Culture adequate volume Performed at [facility], [address] [place], [place] [address]    Culture   Final    NO GROWTH 5 DAYS Performed at [facility], [address], [place], [place] [address]    Report Status 06/21/2024 FINAL  Final  Resp panel by RT-PCR (RSV, Flu A&B, Covid)  Anterior Nasal Swab     Status: None   Collection Time: 06/21/24 12:26 PM   Specimen: Anterior Nasal Swab  Result Value Ref Range Status   SARS Coronavirus 2 by RT PCR NEGATIVE NEGATIVE Final   Influenza A by PCR NEGATIVE NEGATIVE Final   Influenza B by PCR NEGATIVE NEGATIVE Final    Comment: (NOTE) The Xpert Xpress SARS-CoV-2/FLU/RSV plus assay is intended as an aid in the diagnosis of influenza from Nasopharyngeal swab specimens and should not be used as a sole basis for treatment. Nasal washings and aspirates are unacceptable for Xpert Xpress SARS-CoV-2/FLU/RSV testing.  Fact Sheet for Patients: bloggercourse.com  Fact Sheet for Healthcare Providers: seriousbroker.it  This test is not yet approved or cleared by the [place]  FDA and has been authorized for detection and/or diagnosis of SARS-CoV-2 by FDA under an Emergency Use Authorization (EUA). This EUA will remain in effect (meaning this test can be used) for the duration of the COVID-19 declaration under Section 564(b)(1) of the Act, 21 U.S.C. section 360bbb-3(b)(1), unless the authorization is terminated or revoked.     Resp Syncytial Virus by PCR NEGATIVE NEGATIVE Final    Comment: (NOTE) Fact Sheet for Patients: bloggercourse.com  Fact Sheet for Healthcare Providers: seriousbroker.it  This test is not yet approved or cleared by the [place]  FDA and has been authorized for detection and/or diagnosis of SARS-CoV-2 by FDA under an Emergency Use Authorization (EUA). This EUA will remain in effect (meaning this test can be used) for the duration of the COVID-19 declaration under Section 564(b)(1) of the Act, 21 U.S.C. section 360bbb-3(b)(1), unless the authorization is terminated or revoked.  Performed at [facility], [address], [place], [place] [address]     Radiology Report CT Angio Chest  Pulmonary Embolism (PE) W or WO Contrast Result Date: 06/23/2024 CLINICAL DATA:  Possible pulmonary embolism. Recent left thoracentesis. EXAM: CT ANGIOGRAPHY CHEST WITH CONTRAST TECHNIQUE: Multidetector CT imaging of the chest was performed using the standard protocol during bolus administration of intravenous contrast. Multiplanar CT image reconstructions and MIPs were obtained to evaluate the vascular anatomy. RADIATION DOSE REDUCTION: This exam was performed according to the departmental dose-optimization program which includes automated exposure control, adjustment of the mA and/or kV according to patient size and/or use of iterative reconstruction technique. CONTRAST:  75mL OMNIPAQUE  IOHEXOL  350 MG/ML SOLN COMPARISON:  CT 06/10/2024 FINDINGS: Cardiovascular: Mild stable cardiomegaly with interval decrease in pericardial effusion. Mild calcification over the left main and 3 vessel coronary arteries. Thoracic aorta is normal caliber. Mild calcified plaque over the descending thoracic aorta. Pulmonary arterial system is well opacified without evidence of emboli. Remaining vascular structures are unremarkable. Mediastinum/Nodes: No significant adenopathy over the hilar mediastinal regions. There are multiple calcified mediastinal and hilar lymph nodes. Remaining mediastinal structures are unremarkable. Lungs/Pleura: Lungs are adequately inflated. There is a small left pleural effusion with associated basilar atelectasis. There is a small to moderate size right pleural effusion with associated basilar atelectasis. No pneumothorax. Bilateral calcified granulomas are present. Mild atelectatic change over the lingula and dependent left upper lobe. Subtle hazy density over the perihilar region of the left upper lobe possibly due to minimal asymmetric edema and less likely early infection. Mild tracheobronchomalacia new compared to the prior exam as this is transient. Upper Abdomen: Mild calcified plaque over the  abdominal aorta. No acute findings of the visualized upper abdominal structures. Musculoskeletal: No focal abnormality. Review of the MIP images confirms the above findings. IMPRESSION: 1. No evidence of pulmonary embolism. 2. Small to moderate size right pleural effusion with associated basilar atelectasis. Small left pleural effusion with associated basilar atelectasis. 3. Subtle hazy density over the perihilar region of the left upper lobe possibly due to minimal asymmetric edema and less likely early infection. 4. Mild stable cardiomegaly with interval decrease in pericardial effusion. Atherosclerotic coronary artery disease. 5. Aortic atherosclerosis. 6. Evidence of old granulomatous disease. Aortic Atherosclerosis (ICD10-I70.0). Electronically Signed   By: Toribio Agreste M.D.   On: 06/23/2024 10:04   DG Chest 1 View Result Date: 06/23/2024 EXAM: 1 VIEW(S) XRAY OF THE CHEST 06/23/2024  09:34:00 AM COMPARISON: 06/23/2024 CLINICAL HISTORY: Pleural effusion on left. FINDINGS: LUNGS AND PLEURA: Bibasilar homogeneous opacities. Mild pulmonary edema. Significant interval decrease in left pleural effusion, now small. Small right pleural effusion. No pneumothorax. HEART AND MEDIASTINUM: Cardiomegaly. Aortic arch atherosclerosis. Calcified mediastinal and hilar lymph nodes. BONES AND SOFT TISSUES: No acute osseous abnormality. IMPRESSION: 1. Significant interval decrease in left pleural effusion, now small. 2. Small right pleural effusion. 3. Cardiomegaly with mild pulmonary edema and bibasilar homogeneous opacities; aortic arch atherosclerosis and calcified mediastinal/hilar lymph nodes. Electronically signed by: Evalene Coho MD 06/23/2024 09:38 AM EST RP Workstation: [id number]   IR THORACENTESIS LEFT ASP PLEURAL SPACE W/IMG GUIDE Result Date: 06/23/2024 INDICATION: 84 year old female with a PMHx of CHF and sarcoidosis with a large left sided pleural effusion. IR received request for diagnostic and therapeutic  left thoracentesis. This is patients first thoracentesis. EXAM: ULTRASOUND GUIDED DIAGNOSTIC AND THERAPEUTIC LEFT THORACENTESIS MEDICATIONS: 9mL of 1% Lidocaine  COMPLICATIONS: None immediate. PROCEDURE: An ultrasound guided thoracentesis was thoroughly discussed with the patient and questions answered. The benefits, risks, alternatives and complications were also discussed. The patient understands and wishes to proceed with the procedure. Written consent was obtained. Ultrasound was performed to localize and mark an adequate pocket of fluid in the left chest. The area was then prepped and draped in the normal sterile fashion. 1% Lidocaine  was used for local anesthesia. Under ultrasound guidance a 6 Fr Safe-T-Centesis catheter was introduced. Thoracentesis was performed. The catheter was removed and a dressing applied. FINDINGS: A total of approximately of blood tinged fluid was removed. Samples were sent to the laboratory as requested by the clinical team. IMPRESSION: Successful ultrasound guided left thoracentesis yielding 900 mL of pleural fluid. Performed by Thersia Rummer, PA-C Electronically Signed   By: Marcey Moan M.D.   On: 06/23/2024 09:23   DG Chest Port 1 View Result Date: 06/23/2024 EXAM: 1 VIEW(S) XRAY OF THE CHEST 06/23/2024 06:30:00 AM COMPARISON: 06/22/2024 CLINICAL HISTORY: SOB (shortness of breath) FINDINGS: LUNGS AND PLEURA: Large left pleural effusion, likely increased. Small right pleural effusion. Bibasilar homogeneous opacities. Mild pulmonary edema. No pneumothorax. HEART AND MEDIASTINUM: Cardiomegaly. Aortic arch atherosclerosis. Calcified mediastinal and hilar lymph nodes. BONES AND SOFT TISSUES: No acute osseous abnormality. IMPRESSION: 1. Large left pleural effusion, likely increased, and small right pleural effusion. 2. Bibasilar homogeneous opacities and mild pulmonary edema. 3. Cardiomegaly, aortic arch atherosclerosis, and calcified mediastinal and hilar lymph nodes.  Electronically signed by: Evalene Coho MD 06/23/2024 06:37 AM EST RP Workstation: [id number]   DG CHEST PORT 1 VIEW Result Date: 06/22/2024 EXAM: 1 VIEW(S) XRAY OF THE CHEST 06/22/2024 10:32:00 PM COMPARISON: 06/22/2024 CLINICAL HISTORY: SOB (shortness of breath) FINDINGS: LUNGS AND PLEURA: Hazy opacification of left lung. Large left-sided pleural effusion is noted. Moderate right effusion is seen. Central vascular congestion is noted. These changes have worsened in the interval from the prior exam, although they may be positional in nature. No pneumothorax. HEART AND MEDIASTINUM: Cardiomegaly. Calcified mediastinal and hilar lymph nodes. BONES AND SOFT TISSUES: No acute osseous abnormality. IMPRESSION: 1. Worsening hazy opacification of the left lung with large left pleural effusion and moderate right pleural effusion 2. Cardiomegaly with central vascular congestion. Electronically signed by: Oneil Devonshire MD 06/22/2024 10:43 PM EST RP Workstation: [id number]   MR CARDIAC MORPHOLOGY W WO CONTRAST Result Date: 06/22/2024 CLINICAL DATA:  Clinical question of constrictive pericarditis. Study assumes HCT of 33 and BSA of 2.15 m2. EXAM: CARDIAC MRI TECHNIQUE: The patient was scanned on a  1.5 Tesla GE magnet. A dedicated cardiac coil was used. Functional imaging was done using Fiesta sequences. 2,3, and 4 chamber views were done to assess for RWMA's. Modified Simpson's rule using was used to calculate an ejection fraction on a dedicated work [employer]. The patient received 10 cc of Gadavist . After 10 minutes inversion recovery sequences were used to assess for infiltration and scar tissue. Flow quantification was performed 2 times during this examination with flow quantification performed at the levels of the ascending aorta above the valve, pulmonary artery above the valve. CONTRAST:  10 cc  of Gadavist  FINDINGS: 1. Normal left ventricular size, with LVEDD 54 mm, and LVEDVi 55 mL/m2. Normal  left ventricular thickness, with intraventricular septal thickness of 6 mm, posterior wall thickness of 5 mm, and myocardial mass index of 49 g/m2. Normal left ventricular systolic function (LVEF =61%). There are no regional wall motion abnormalities. Left ventricular parametric mapping (1.5 T, MOLLI) notable for mild T2 increase (57 ms) and increase in ECV 38%. There is late gadolinium enhancement in the left ventricular myocardium- inferior insertion point LGE. Normal resting perfusion. 2. Normal right ventricular size with RVEDVI 69 mL/m2. Normal right ventricular thickness. Decreased right ventricular systolic function (RVEF =38%). There are no regional wall motion abnormalities or aneurysms, septal hypokinesis. Preserved basal excursion. 3. Bi-atrial dilation. IVC dilation 25 mm. No IVC collapse see on exam. QP/Qs is 1.13 but with no visualized shunting. 4. Normal size of the aortic root, ascending aorta. Moderate dilation of the main pulmonary artery, 31 mm. 5. Valve assessment: Aortic Valve: Tri-leaflet, no significant stenosis or regurgitation. Mean gradient 2 mm Hg. Aortic regurgitant fraction <1 %. Pulmonic Valve:Normal morphology. No significant stenosis or regurgitation. Regurgitant fraction < 1% Tricuspid Valve:Tri-leaflet, qualitatively mild tricuspid regurgitation. Mitral Valve: Normal morphology. No significant stenosis or prolapse. Mitral regurgitant volume 3 mL, regurgitant fraction 4% 6. Normal pericardial thickness. No pericardial hyper-enhancement. Normal slippage on tagging sequences. There is a D shaped septum seen in inspiration that is not seen during expiratory. A septal bounce is seen on strain imaging. Moderate pericardial effusion. Circumferential with anterior predominance. 7. There are bilateral pleural effusions with secondary atelectasis. There are hilar and mediastinal lymph nodes greater than 1 cm. Recommended dedicated study if clinically indicated. 8. Free breathing artifacts  noted. This decreased the sensitivity of RV quantification. IMPRESSION: 1. There is a moderate pericardial effusion. The inferior vena cava is dilated with reduced respiratory collapse, consistent with elevated right sided filling pressures. In diastole, the left ventricle has a D shaped configuration with accentuated respiratory variation in septal position (septal bounce) on strain imaging and with free breathing, indicating ventricular interdependence. This is seen during inspiration and can be seen in constrictive physiology. 2. Normal left ventricular systolic function (LVEF =61%). 3. Decreased right ventricular systolic function (RVEF =38%). 4. There are bilateral pleural effusions with secondary atelectasis. Reviewed with Drs. Patwardhan and Pahwani. Stanly Leavens MD Electronically Signed   By: Stanly Leavens M.D.   On: 06/22/2024 13:50   MR CARDIAC VELOCITY FLOW MAP Result Date: 06/22/2024 CLINICAL DATA:  Clinical question of constrictive pericarditis. Study assumes HCT of 33 and BSA of 2.15 m2. EXAM: CARDIAC MRI TECHNIQUE: The patient was scanned on a 1.5 Tesla GE magnet. A dedicated cardiac coil was used. Functional imaging was done using Fiesta sequences. 2,3, and 4 chamber views were done to assess for RWMA's. Modified Simpson's rule using was used to calculate an ejection fraction on a dedicated work station  using [employer]. The patient received 10 cc of Gadavist . After 10 minutes inversion recovery sequences were used to assess for infiltration and scar tissue. Flow quantification was performed 2 times during this examination with flow quantification performed at the levels of the ascending aorta above the valve, pulmonary artery above the valve. CONTRAST:  10 cc  of Gadavist  FINDINGS: 1. Normal left ventricular size, with LVEDD 54 mm, and LVEDVi 55 mL/m2. Normal left ventricular thickness, with intraventricular septal thickness of 6 mm, posterior wall thickness of 5 mm, and  myocardial mass index of 49 g/m2. Normal left ventricular systolic function (LVEF =61%). There are no regional wall motion abnormalities. Left ventricular parametric mapping (1.5 T, MOLLI) notable for mild T2 increase (57 ms) and increase in ECV 38%. There is late gadolinium enhancement in the left ventricular myocardium- inferior insertion point LGE. Normal resting perfusion. 2. Normal right ventricular size with RVEDVI 69 mL/m2. Normal right ventricular thickness. Decreased right ventricular systolic function (RVEF =38%). There are no regional wall motion abnormalities or aneurysms, septal hypokinesis. Preserved basal excursion. 3. Bi-atrial dilation. IVC dilation 25 mm. No IVC collapse see on exam. QP/Qs is 1.13 but with no visualized shunting. 4. Normal size of the aortic root, ascending aorta. Moderate dilation of the main pulmonary artery, 31 mm. 5. Valve assessment: Aortic Valve: Tri-leaflet, no significant stenosis or regurgitation. Mean gradient 2 mm Hg. Aortic regurgitant fraction <1 %. Pulmonic Valve:Normal morphology. No significant stenosis or regurgitation. Regurgitant fraction < 1% Tricuspid Valve:Tri-leaflet, qualitatively mild tricuspid regurgitation. Mitral Valve: Normal morphology. No significant stenosis or prolapse. Mitral regurgitant volume 3 mL, regurgitant fraction 4% 6. Normal pericardial thickness. No pericardial hyper-enhancement. Normal slippage on tagging sequences. There is a D shaped septum seen in inspiration that is not seen during expiratory. A septal bounce is seen on strain imaging. Moderate pericardial effusion. Circumferential with anterior predominance. 7. There are bilateral pleural effusions with secondary atelectasis. There are hilar and mediastinal lymph nodes greater than 1 cm. Recommended dedicated study if clinically indicated. 8. Free breathing artifacts noted. This decreased the sensitivity of RV quantification. IMPRESSION: 1. There is a moderate pericardial effusion.  The inferior vena cava is dilated with reduced respiratory collapse, consistent with elevated right sided filling pressures. In diastole, the left ventricle has a D shaped configuration with accentuated respiratory variation in septal position (septal bounce) on strain imaging and with free breathing, indicating ventricular interdependence. This is seen during inspiration and can be seen in constrictive physiology. 2. Normal left ventricular systolic function (LVEF =61%). 3. Decreased right ventricular systolic function (RVEF =38%). 4. There are bilateral pleural effusions with secondary atelectasis. Reviewed with Drs. Patwardhan and Pahwani. Stanly Leavens MD Electronically Signed   By: Stanly Leavens M.D.   On: 06/22/2024 13:50   MR CARDIAC VELOCITY FLOW MAP Result Date: 06/22/2024 CLINICAL DATA:  Clinical question of constrictive pericarditis. Study assumes HCT of 33 and BSA of 2.15 m2. EXAM: CARDIAC MRI TECHNIQUE: The patient was scanned on a 1.5 Tesla GE magnet. A dedicated cardiac coil was used. Functional imaging was done using Fiesta sequences. 2,3, and 4 chamber views were done to assess for RWMA's. Modified Simpson's rule using was used to calculate an ejection fraction on a dedicated work [employer]. The patient received 10 cc of Gadavist . After 10 minutes inversion recovery sequences were used to assess for infiltration and scar tissue. Flow quantification was performed 2 times during this examination with flow quantification performed at the levels of the ascending  aorta above the valve, pulmonary artery above the valve. CONTRAST:  10 cc  of Gadavist  FINDINGS: 1. Normal left ventricular size, with LVEDD 54 mm, and LVEDVi 55 mL/m2. Normal left ventricular thickness, with intraventricular septal thickness of 6 mm, posterior wall thickness of 5 mm, and myocardial mass index of 49 g/m2. Normal left ventricular systolic function (LVEF =61%). There are no regional wall  motion abnormalities. Left ventricular parametric mapping (1.5 T, MOLLI) notable for mild T2 increase (57 ms) and increase in ECV 38%. There is late gadolinium enhancement in the left ventricular myocardium- inferior insertion point LGE. Normal resting perfusion. 2. Normal right ventricular size with RVEDVI 69 mL/m2. Normal right ventricular thickness. Decreased right ventricular systolic function (RVEF =38%). There are no regional wall motion abnormalities or aneurysms, septal hypokinesis. Preserved basal excursion. 3. Bi-atrial dilation. IVC dilation 25 mm. No IVC collapse see on exam. QP/Qs is 1.13 but with no visualized shunting. 4. Normal size of the aortic root, ascending aorta. Moderate dilation of the main pulmonary artery, 31 mm. 5. Valve assessment: Aortic Valve: Tri-leaflet, no significant stenosis or regurgitation. Mean gradient 2 mm Hg. Aortic regurgitant fraction <1 %. Pulmonic Valve:Normal morphology. No significant stenosis or regurgitation. Regurgitant fraction < 1% Tricuspid Valve:Tri-leaflet, qualitatively mild tricuspid regurgitation. Mitral Valve: Normal morphology. No significant stenosis or prolapse. Mitral regurgitant volume 3 mL, regurgitant fraction 4% 6. Normal pericardial thickness. No pericardial hyper-enhancement. Normal slippage on tagging sequences. There is a D shaped septum seen in inspiration that is not seen during expiratory. A septal bounce is seen on strain imaging. Moderate pericardial effusion. Circumferential with anterior predominance. 7. There are bilateral pleural effusions with secondary atelectasis. There are hilar and mediastinal lymph nodes greater than 1 cm. Recommended dedicated study if clinically indicated. 8. Free breathing artifacts noted. This decreased the sensitivity of RV quantification. IMPRESSION: 1. There is a moderate pericardial effusion. The inferior vena cava is dilated with reduced respiratory collapse, consistent with elevated right sided filling  pressures. In diastole, the left ventricle has a D shaped configuration with accentuated respiratory variation in septal position (septal bounce) on strain imaging and with free breathing, indicating ventricular interdependence. This is seen during inspiration and can be seen in constrictive physiology. 2. Normal left ventricular systolic function (LVEF =61%). 3. Decreased right ventricular systolic function (RVEF =38%). 4. There are bilateral pleural effusions with secondary atelectasis. Reviewed with Drs. Patwardhan and Pahwani. Stanly Leavens MD Electronically Signed   By: Stanly Leavens M.D.   On: 06/22/2024 13:50   DG CHEST PORT 1 VIEW Result Date: 06/22/2024 CLINICAL DATA:  Pleural effusion.  Shortness of breath. EXAM: PORTABLE CHEST 1 VIEW COMPARISON:  06/21/2024 FINDINGS: Lungs are hypoinflated demonstrate continued evidence of moderate to large left effusion likely with associated compressive atelectasis in the left base. Stable hazy opacification over the right base. Underlying pneumonia is possible as suggested on recent CT. Mild stable cardiomegaly. Remainder of the exam is unchanged. IMPRESSION: 1. Stable moderate to large left effusion likely with associated compressive atelectasis in the left base. Stable hazy opacification over the right base. Underlying pneumonia in the lung bases is possible. 2. Stable cardiomegaly. Electronically Signed   By: Toribio Agreste M.D.   On: 06/22/2024 13:42     Signature  -   Lavada Stank M.D on 06/24/2024 at 10:09 AM   -  To page go to [email or web address]   "

## 2024-06-24 NOTE — Progress Notes (Signed)
 "   Patient Name: [name] Date of Encounter: 06/24/2024 [facility] Cardiologist: Georganna Archer, MD   Interval Summary  .    Breathing improving  Vital Signs .    Vitals:   06/24/24 0000 06/24/24 0400 06/24/24 0653 06/24/24 0800  BP: 124/78 (!) 105/59 108/61 119/67  Pulse: 72 81 86 71  Resp: (!) 23 (!) 21 (!) 22 (!) 21  Temp: 97.7 F (36.5 C) 97.7 F (36.5 C)  (!) 97.5 F (36.4 C)  TempSrc: Oral Oral  Oral  SpO2: 93% 94% 98% 98%  Weight:      Height:        Intake/Output Summary (Last 24 hours) at 06/24/2024 1133 Last data filed at 06/24/2024 0429 Gross per 24 hour  Intake 492 ml  Output 800 ml  Net -308 ml      06/21/2024    9:25 PM 06/20/2024    4:45 AM 06/19/2024    5:00 AM  Last 3 Weights  Weight (lbs) 225 lb 239 lb 10.2 oz 240 lb 8.4 oz  Weight (kg) 102.059 kg 108.7 kg 109.1 kg      Telemetry/ECG    Telemetry 06/24/2024- Personally Reviewed Rate controlled A-fib  Chest x-ray 06/23/2024:1.  Significant interval decrease in left pleural effusion, now small. 2. Small right pleural effusion. 3. Cardiomegaly with mild pulmonary edema and bibasilar homogeneous opacities; aortic arch atherosclerosis and calcified mediastinal/hilar lymph nodes.  Cardiac MRI 06/23/2024: 1. There is a moderate pericardial effusion. The inferior vena cava is dilated with reduced respiratory collapse, consistent with elevated right sided filling pressures. In diastole, the left ventricle has a D shaped configuration with accentuated respiratory variation in septal position (septal bounce) on strain imaging and with free breathing, indicating ventricular interdependence. This is seen during inspiration and can be seen in constrictive physiology. 2. Normal left ventricular systolic function (LVEF =61%). 3. Decreased right ventricular systolic function (RVEF =38%). 4. There are bilateral pleural effusions with secondary atelectasis.  Echocardiogram  06/21/2024: 1. Moderate pericardial effusion which is more prominent up to 2.0 cm  posterior to the LV (best seen subcostal; image 49). The RV is poorly  visualized, but does not appear to collapse in diastole. The IVC is  dilated. There is evidence of respiratory  variation in septal movement on image 41. Findings are suggestive of a  clinically significant pericardial effusion. Clinical correlation is  recommended. Appearance is stable compared to prior study. Moderate  pericardial effusion. The pericardial effusion   is circumferential.   2. Right ventricular systolic function was not well visualized. The right  ventricular size is not well visualized. Tricuspid regurgitation signal is  inadequate for assessing PA pressure.   3. The mitral valve is grossly normal. Trivial mitral valve  regurgitation. No evidence of mitral stenosis.   4. The aortic valve is tricuspid. Aortic valve regurgitation is not  visualized. Aortic valve sclerosis is present, with no evidence of aortic  valve stenosis.   5. The inferior vena cava is dilated in size with <50% respiratory  variability, suggesting right atrial pressure of 15 mmHg.   6. Left ventricular ejection fraction, by estimation, is 60 to 65%. The  left ventricle has normal function. Left ventricular endocardial border  not optimally defined to evaluate regional wall motion.   Comparison(s): No significant change from prior study.   Physical Exam .   Physical Exam Vitals and nursing note reviewed.  Constitutional:      General: She is not in acute  distress. Neck:     Vascular: No JVD.  Cardiovascular:     Rate and Rhythm: Normal rate and regular rhythm.     Heart sounds: Normal heart sounds. No murmur heard. Pulmonary:     Effort: Pulmonary effort is normal.     Breath sounds: No wheezing or rales.     Comments: Rales resolved Musculoskeletal:     Right lower leg: Edema (Trace) present.     Left lower leg: Edema (Trace) present.      Assessment & Plan .     84 year old female with hypertension, hyperlipidemia, diabetes mellitus, sarcoidosis, OSA, DVT/PE, longstanding persistent A-fib (on warfarin), recent diagnosis of moderate to large pericardial effusion in the setting of uremia with subsequent resolution of uremia and improvement in pericardial effusion, discharged to SNF on 06/20/2024, readmitted with worsening shortness of breath and oxygen  requirement.   Acute on chronic hypoxic respiratory failure: Multifactorial, including moderate pericardial effusion with possible constriction physiology, pleural effusions, acute on chronic HFpEF. Significant clinical improvement since left thoracentesis on 06/23/2024. Continue diuresis with IV Lasix  40 mg twice daily for at least 1 more day. 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  Best hope is that she will clinically improved with thoracentesis and continued diuresis.  If she does not, we could consider pericardiocentesis.  Even though pericardial effusion is not large, draining the fluid may help, if there is any clinical worsening symptoms of her dyspnea.  Permanent atrial fibrillation: Rate controlled. For now, reasonable to continue IV heparin .  If she clinically continues to improve, I think we can resume warfarin on 06/25/2024.  We will continue to follow the patient with you.  For questions or updates, please contact [facility] Please consult [email or web address] for contact info under        Signed, Newman JINNY Lawrence, MD   "

## 2024-06-24 NOTE — Care Management Important Message (Signed)
 Important Message  Patient Details  Name: [name] MRN: [id number] Date of Birth: September 23, 1940   Important Message Given:  Yes - Medicare IM  Patient left prior to IM delivery will mail a copy to the patients home address.     [name] 06/24/2024, 2:41 PM

## 2024-06-24 NOTE — Progress Notes (Signed)
 PHARMACY - ANTICOAGULATION CONSULT NOTE  Pharmacy Consult for heparin  when INR <2 Indication: atrial fibrillation, hx PE, warfarin on hold  Allergies[1]  Patient Measurements: Height: 5' 4 (162.6 cm) Weight: 102.1 kg (225 lb) IBW/kg (Calculated) : 54.7 HEPARIN  DW (KG): 78.5  Vital Signs: Temp: 97.5 F (36.4 C) (01/16 0800) Temp Source: Oral (01/16 0800) BP: 119/67 (01/16 0800) Pulse Rate: 71 (01/16 0800)  Labs: Recent Labs    06/22/24 0404 06/23/24 0300 06/23/24 0740 06/24/24 0411  HGB 10.1*  --  10.3* 9.7*  HCT 32.6*  --  31.8* 30.2*  PLT 156  --  109* 103*  LABPROT 36.6* 39.7*  --  35.0*  INR 3.5* 3.9*  --  3.3*  CREATININE 0.82  --  0.68 0.64    Estimated Creatinine Clearance: 59.8 mL/min (by C-G formula based on SCr of 0.64 mg/dL).   Medical History: Past Medical History:  Diagnosis Date   Anxiety    takes Lorazepam  daily as needed   Arthritis    Cancer (HCC)    thyroid    Cataracts, bilateral    immature   Chronic back pain    compressed vertebrae,takes Fosamax weekly   Family history of adverse reaction to anesthesia    granddaughter gets very sick and mean   History of blood transfusion    no abnormal reaction noted   History of bronchitis 05/2015   History of colon polyps    benign   Hyperlipidemia    takes Zetia  daily   Hypertension    takes Diltiazem  and Avapro  daily   Hypothyroidism    takes Synthroid  daily   Ischemic colitis    hx of-many yrs ago   Joint pain    Joint swelling    OSA on CPAP    05-08-12 AHi was 46, RDI  53. titrated to   9 cm water   with 3 cm flex.-nadir 75%   PE (pulmonary embolism)    takes Coumadin  daily   Peripheral edema    Pneumonia 1992   hx of   Sarcoidosis    Shortness of breath    Urinary frequency    Weakness    left leg.Numbness and tingling.Has sciatica   Wheeze    occaionally. Not new    Medications:  Medications Prior to Admission  Medication Sig Dispense Refill Last Dose/Taking    acetaminophen  (TYLENOL ) 325 MG tablet Take 325-650 mg by mouth 2 (two) times daily as needed for mild pain or headache.   Past Week   bisacodyl (DULCOLAX) 10 MG suppository Place 10 mg rectally daily as needed for moderate constipation.   Unknown   Carboxymethylcellulose Sodium (REFRESH LIQUIGEL OP) Place 1 drop into both eyes 2 (two) times daily as needed (Dry eye).   Unknown   carvedilol  (COREG ) 3.125 MG tablet Take 1 tablet (3.125 mg total) by mouth 2 (two) times daily with a meal. 60 tablet 0 06/21/2024 Morning   cyanocobalamin (VITAMIN B12) 1000 MCG tablet Take 1,000 mcg by mouth daily.   06/21/2024   diltiazem  (CARDIZEM ) 120 MG tablet Take 1 tablet (120 mg total) by mouth every 8 (eight) hours. 90 tablet 0 06/21/2024 Morning   furosemide  (LASIX ) 40 MG tablet Take 40 mg by mouth daily.   06/21/2024 Morning   levothyroxine  (SYNTHROID ) 112 MCG tablet Take 112 mcg by mouth every morning.   06/21/2024 Morning   LORazepam  (ATIVAN ) 1 MG tablet Take 1.5 tablets (1.5 mg total) by mouth See admin instructions. Take 1.5 mg by mouth at  bedtime and an additional 1-1.5mg  once a day as needed for anxiety (Patient taking differently: Take 1-1.5 mg by mouth See admin instructions. Take 1.5 mg by mouth at bedtime.  PRN order: take 1 mg by mouth every 24 hours as needed for anxiety.) 10 tablet 0 06/21/2024   magnesium  hydroxide (MILK OF MAGNESIA) 400 MG/5ML suspension Take 30 mLs by mouth daily as needed for mild constipation or moderate constipation.   Unknown   melatonin 5 MG TABS Take 1 tablet (5 mg total) by mouth at bedtime as needed (if lorazepam  doesn't work). (Patient taking differently: Take 5 mg by mouth at bedtime as needed (insomnia).)   Unknown   polyethylene glycol (MIRALAX  / GLYCOLAX ) 17 g packet Take 17 g by mouth daily as needed for mild constipation. 14 each 0 Unknown   senna-docusate (SENOKOT-S) 8.6-50 MG tablet Take 1-2 tablets by mouth 2 (two) times daily between meals as needed for mild constipation  or moderate constipation. (Patient taking differently: Take 2 tablets by mouth 2 (two) times daily as needed for mild constipation or moderate constipation.)   Past Week   SIMPLY SALINE NA Place 1 spray into both nostrils every 12 (twelve) hours as needed (for dryness).   Past Week   sodium chloride  1 g tablet Take 1 tablet (1 g total) by mouth 3 (three) times daily with meals. 30 tablet 0 06/21/2024 Morning   Sodium Phosphates (ENEMA RE) Place 1 Dose rectally daily as needed (constipation).   Unknown   warfarin (COUMADIN ) 5 MG tablet Take 5 mg by mouth at bedtime.  2 06/20/2024 at 10:00 PM   diltiazem  (CARDIZEM  CD) 360 MG 24 hr capsule Take 360 mg by mouth at bedtime. (Patient not taking: Reported on 06/21/2024)   Not Taking   irbesartan  (AVAPRO ) 75 MG tablet Take 75 mg by mouth daily. (Patient not taking: Reported on 06/21/2024)   Not Taking   nebivolol  (BYSTOLIC ) 5 MG tablet Take 5 mg by mouth daily. (Patient not taking: Reported on 06/21/2024)   Not Taking   nystatin  cream (MYCOSTATIN ) Apply 1 Application topically 2 (two) times daily as needed for dry skin. (Patient not taking: Reported on 06/21/2024)   Not Taking   triamcinolone cream (KENALOG) 0.1 % Apply 1 Application topically 2 (two) times daily as needed (Irritation). (Patient not taking: Reported on 06/21/2024)   Not Taking    Assessment: [name] is a 84 y.o. year old female presented on 06/21/2024 with concern for shob found to have aspiration pneumonia, pericardial effusion, and pleural effusion. On warfarin prior to admission for afib and hx of PE. Prior to admission regimen is warfarin 5mg  PO daily. Last dose prior to admission 1/12 2200. Pharmacy initially consulted for warfarin inpatient management.   Date INR Warfarin Dose  1/12 2.2, therapeutic  5mg  (pta)   1/13 2.6, therapeutic  5mg    1/14 3.5, supra-therapeutic    1/15 3.9, supra-therapeutic    Patient s/p thoracentesis 1/15 with fluid removed.  Pharmacy  consulted to start heparin  when INR <2 in the event repeat thoracentesis or pericardial effusion drainage needed.  INR 3.3, No heparin  at this time.  Goal of Therapy:  INR 2-3 Monitor platelets by anticoagulation protocol: Yes   Plan:  No heparin  today  Daily PT/INR and CBC Will start heparin  when INR <2 Assess for signs/sx bleeding and drug-drug interactions  Thank you for allowing pharmacy to participate in this patient's care.  [employer], Pharm.D., BCPS Clinical Pharmacist Clinical phone for  06/24/2024 from 7:30-3:00 is [phone].  **Pharmacist phone directory can be found on [email or web address] listed under [facility].  06/24/2024 9:01 AM            [1]  Allergies Allergen Reactions   Atenolol Other (See Comments)    Depression    Citalopram Hydrobromide Nausea Only and Other (See Comments)    Nausea/Fatigue   Clindamycin /Lincomycin Other (See Comments)    Via IV, has caused a metallic taste in the mouth    Codeine Other (See Comments)    Caused Hyperactivity and Dysphoria   Coenzyme Q10 Itching   Erythromycin Itching, Anxiety and Other (See Comments)    Also with jitters   Motrin  [Ibuprofen ] Other (See Comments)    Is to not take this   Statins Other (See Comments)    Leg muscle weakness w/ rosuvastatin and simvastatin    Latex Rash   Losartan Potassium Other (See Comments)    Muscle aches

## 2024-06-24 NOTE — Progress Notes (Signed)
 Speech Language Pathology Treatment: Dysphagia  Patient Details Name: [name] MRN: [id number] DOB: 1940-12-09 Today's Date: 06/24/2024 Time: 8494-8464 SLP Time Calculation (min) (ACUTE ONLY): 30 min  Assessment / Plan / Recommendation Clinical Impression  Pt/family seen at bedside for skilled ST intervention targeting education following esophageal study. SLP discussed behavioral and dietary strategies for management of esophageal dysmotility including the following:  Behavioral and Dietary Strategies for Management of Esophageal Dysmotility 1. Take reflux medications 30+ minutes before other po intake, in the morning 2. Begin meals with warm beverage 3. Eat smaller more frequent meals 4. Eat slowly, taking small bites and sips 5. Alternate solids and liquids 6. Avoid foods/liquids that increase acid production 7. Sit upright during and for 30+ minutes after meals to facilitate esophageal clearing  Pt/family provided with the above information in written form as well. SLP provided family with the opportunity to ask questions, and answered to family satisfaction. Recommend resume mechanical soft solids with thin liquids, add extra gravy/sauce for moisture. Crush large pills. Encourage pt to adhere to precautions reviewed today. No further ST intervention recommended at this time. Please reconsult if needs arise. Medical team informed.    HPI HPI: 84yo female admitted from SNF 06/21/24 with SOB, acute hypoxic respiratory failure, pleural effusion, and acute on chronic CHF. DC'd from [facility]  06/20/24 after bacteremia and pericardial effusion. CXR: small left/right pleural effusions, mild pulmonary edema and bibasilar homogeneous opacities No prior ST intervention. PMH: thyroid  cancer, CHF, HTN, HLD, sarcoidosis, arthritis, anxiety, thyroid  CA, AFib, PE, DM, OSA. Urgent thoracentesis 03/23/25, on 10L [facility]NC      SLP Plan  Discharge SLP treatment due to goals met, education completed         Swallow Evaluation Recommendations    Dys3/thin, crush large meds Reflux/esophageal dysmotility precautions     Recommendations  Diet recommendations: Dysphagia 3 (mechanical soft);Thin liquid (add extra gravy/sauce for moisture) Liquids provided via: Cup;Straw Medication Administration: Other (Comment) (crush large pills due to esophageal dysphagia) Supervision: Patient able to self feed Compensations: Minimize environmental distractions;Slow rate;Small sips/bites;Other (Comment);Follow solids with liquid (smaller more frequent meals) Postural Changes and/or Swallow Maneuvers: Seated upright 90 degrees;Upright 30-60 min after meal        Oral care BID   Other (comment) (per PT/OT) Dysphagia, unspecified (R13.10)     Discharge SLP treatment due to goals met, education completed.    Jamesa Tedrick B. Dory, [facility], CCC-SLP Speech Language Pathologist Office: [phone]  Dory Caprice Daring 06/24/2024, 3:44 PM

## 2024-06-25 ENCOUNTER — Inpatient Hospital Stay (HOSPITAL_COMMUNITY)

## 2024-06-25 DIAGNOSIS — L8990 Pressure ulcer of unspecified site, unspecified stage: Secondary | ICD-10-CM | POA: Insufficient documentation

## 2024-06-25 DIAGNOSIS — E43 Unspecified severe protein-calorie malnutrition: Secondary | ICD-10-CM | POA: Insufficient documentation

## 2024-06-25 DIAGNOSIS — I3139 Other pericardial effusion (noninflammatory): Secondary | ICD-10-CM

## 2024-06-25 DIAGNOSIS — I311 Chronic constrictive pericarditis: Secondary | ICD-10-CM

## 2024-06-25 DIAGNOSIS — J90 Pleural effusion, not elsewhere classified: Secondary | ICD-10-CM

## 2024-06-25 DIAGNOSIS — J9621 Acute and chronic respiratory failure with hypoxia: Secondary | ICD-10-CM

## 2024-06-25 LAB — CBC WITH DIFFERENTIAL/PLATELET
Abs Immature Granulocytes: 0.03 K/uL (ref 0.00–0.07)
Basophils Absolute: 0 K/uL (ref 0.0–0.1)
Basophils Relative: 1 %
Eosinophils Absolute: 0.1 K/uL (ref 0.0–0.5)
Eosinophils Relative: 2 %
HCT: 28.6 % — ABNORMAL LOW (ref 36.0–46.0)
Hemoglobin: 9.2 g/dL — ABNORMAL LOW (ref 12.0–15.0)
Immature Granulocytes: 0 %
Lymphocytes Relative: 21 %
Lymphs Abs: 1.6 K/uL (ref 0.7–4.0)
MCH: 29.5 pg (ref 26.0–34.0)
MCHC: 32.2 g/dL (ref 30.0–36.0)
MCV: 91.7 fL (ref 80.0–100.0)
Monocytes Absolute: 2.1 K/uL — ABNORMAL HIGH (ref 0.1–1.0)
Monocytes Relative: 28 %
Neutro Abs: 3.6 K/uL (ref 1.7–7.7)
Neutrophils Relative %: 48 %
Platelets: 103 K/uL — ABNORMAL LOW (ref 150–400)
RBC: 3.12 MIL/uL — ABNORMAL LOW (ref 3.87–5.11)
RDW: 16.6 % — ABNORMAL HIGH (ref 11.5–15.5)
WBC: 7.4 K/uL (ref 4.0–10.5)
nRBC: 0 % (ref 0.0–0.2)

## 2024-06-25 LAB — GLUCOSE, CAPILLARY
Glucose-Capillary: 105 mg/dL — ABNORMAL HIGH (ref 70–99)
Glucose-Capillary: 125 mg/dL — ABNORMAL HIGH (ref 70–99)
Glucose-Capillary: 152 mg/dL — ABNORMAL HIGH (ref 70–99)
Glucose-Capillary: 175 mg/dL — ABNORMAL HIGH (ref 70–99)
Glucose-Capillary: 85 mg/dL (ref 70–99)
Glucose-Capillary: 87 mg/dL (ref 70–99)
Glucose-Capillary: 93 mg/dL (ref 70–99)

## 2024-06-25 LAB — BODY FLUID CELL COUNT WITH DIFFERENTIAL
Eos, Fluid: 2 %
Lymphs, Fluid: 53 %
Monocyte-Macrophage-Serous Fluid: 37 % — ABNORMAL LOW (ref 50–90)
Neutrophil Count, Fluid: 8 % (ref 0–25)
Total Nucleated Cell Count, Fluid: 1653 uL — ABNORMAL HIGH (ref 0–1000)

## 2024-06-25 LAB — BASIC METABOLIC PANEL WITH GFR
Anion gap: 6 (ref 5–15)
BUN: 19 mg/dL (ref 8–23)
CO2: 41 mmol/L — ABNORMAL HIGH (ref 22–32)
Calcium: 8.6 mg/dL — ABNORMAL LOW (ref 8.9–10.3)
Chloride: 93 mmol/L — ABNORMAL LOW (ref 98–111)
Creatinine, Ser: 0.68 mg/dL (ref 0.44–1.00)
GFR, Estimated: >60 mL/min
Glucose, Bld: 88 mg/dL (ref 70–99)
Potassium: 3.5 mmol/L (ref 3.5–5.1)
Sodium: 141 mmol/L (ref 135–145)

## 2024-06-25 LAB — SEDIMENTATION RATE: Sed Rate: 58 mm/h — ABNORMAL HIGH (ref 0–22)

## 2024-06-25 LAB — PROTEIN, TOTAL: Total Protein: 6.4 g/dL — ABNORMAL LOW (ref 6.5–8.1)

## 2024-06-25 LAB — PROTIME-INR
INR: 2.8 — ABNORMAL HIGH (ref 0.8–1.2)
Prothrombin Time: 31.1 s — ABNORMAL HIGH (ref 11.4–15.2)

## 2024-06-25 LAB — ALBUMIN: Albumin: 2.7 g/dL — ABNORMAL LOW (ref 3.5–5.0)

## 2024-06-25 LAB — PHOSPHORUS: Phosphorus: 2.6 mg/dL (ref 2.5–4.6)

## 2024-06-25 LAB — MAGNESIUM: Magnesium: 2.1 mg/dL (ref 1.7–2.4)

## 2024-06-25 LAB — LACTATE DEHYDROGENASE: LDH: 244 U/L — ABNORMAL HIGH (ref 105–235)

## 2024-06-25 MED ORDER — SPIRONOLACTONE 12.5 MG HALF TABLET
12.5000 mg | ORAL_TABLET | Freq: Every day | ORAL | Status: DC
  Administered 2024-06-25 – 2024-06-27 (×3): 12.5 mg via ORAL
  Filled 2024-06-25 (×3): qty 1

## 2024-06-25 MED ORDER — COLCHICINE 0.6 MG PO TABS
0.6000 mg | ORAL_TABLET | Freq: Two times a day (BID) | ORAL | Status: DC
  Administered 2024-06-25 – 2024-07-01 (×13): 0.6 mg via ORAL
  Filled 2024-06-25 (×13): qty 1

## 2024-06-25 NOTE — Progress Notes (Addendum)
 " Cardiology Progress Note  Patient ID: [name] MRN: [id number] DOB: [birth year] Date of Encounter: 06/25/2024 Primary Cardiologist: Georganna Archer, MD  Subjective   Chief Complaint: SOB  HPI: Volume overloaded. CXR with pulm edema.   ROS:  All other ROS reviewed and negative. Pertinent positives noted in the HPI.     Telemetry  Overnight telemetry shows Afib 70-80 bpm, which I personally reviewed.   ECG  The most recent ECG shows Afib 84  bpm, which I personally reviewed.   Physical Exam   Vitals:   06/24/24 1942 06/25/24 0019 06/25/24 0440 06/25/24 0803  BP: (!) 110/56 107/60 (!) 113/51 (!) 116/58  Pulse: 68 73 92   Resp: 14 (!) 25 20   Temp: 97.7 F (36.5 C) 97.7 F (36.5 C) (!) 97.5 F (36.4 C) 97.9 F (36.6 C)  TempSrc: Oral Oral Oral Oral  SpO2: 94% 94% 90%   Weight:      Height:        Intake/Output Summary (Last 24 hours) at 06/25/2024 1005 Last data filed at 06/25/2024 0444 Gross per 24 hour  Intake --  Output 750 ml  Net -750 ml       06/21/2024    9:25 PM 06/20/2024    4:45 AM 06/19/2024    5:00 AM  Last 3 Weights  Weight (lbs) 225 lb 239 lb 10.2 oz 240 lb 8.4 oz  Weight (kg) 102.059 kg 108.7 kg 109.1 kg    Body mass index is 38.62 kg/m.  General: Well nourished, well developed, in no acute distress Head: Atraumatic, normal size  Eyes: PEERLA, EOMI  Neck: Supple, JVD 8 to 10 cm of water  Endocrine: No thryomegaly Cardiac: Irregular rhythm, no murmurs Lungs: Clear to auscultation bilaterally, no wheezing, rhonchi or rales  Abd: Soft, nontender, no hepatomegaly  Ext: 1+ pitting edema Musculoskeletal: No deformities, BUE and BLE strength normal and equal Skin: Warm and dry, no rashes   Neuro: Alert and oriented to person, place, time, and situation, CNII-XII grossly intact, no focal deficits  Psych: Normal mood and affect   Cardiac Studies  TTE 06/21/2024  1. Moderate pericardial effusion which is more prominent up to 2.0 cm   posterior to the LV (best seen subcostal; image 49). The RV is poorly  visualized, but does not appear to collapse in diastole. The IVC is  dilated. There is evidence of respiratory  variation in septal movement on image 41. Findings are suggestive of a  clinically significant pericardial effusion. Clinical correlation is  recommended. Appearance is stable compared to prior study. Moderate  pericardial effusion. The pericardial effusion   is circumferential.   2. Right ventricular systolic function was not well visualized. The right  ventricular size is not well visualized. Tricuspid regurgitation signal is  inadequate for assessing PA pressure.   3. The mitral valve is grossly normal. Trivial mitral valve  regurgitation. No evidence of mitral stenosis.   4. The aortic valve is tricuspid. Aortic valve regurgitation is not  visualized. Aortic valve sclerosis is present, with no evidence of aortic  valve stenosis.   5. The inferior vena cava is dilated in size with <50% respiratory  variability, suggesting right atrial pressure of 15 mmHg.   6. Left ventricular ejection fraction, by estimation, is 60 to 65%. The  left ventricle has normal function. Left ventricular endocardial border  not optimally defined to evaluate regional wall motion.   Patient Profile  [name] is a 84 y.o. 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  Assessment & Plan   # Effusive constrictive pericarditis # Moderate pericardial effusion - Admitted with volume overload.  She has a moderate circumferential pericardial effusion.  Cardiac MRI is concerning for constrictive physiology.  On my review of her MRI her pericardium is thickened.  - Her CT scan shows no pericardial calcifications.  I do not believe she needs pericardiectomy. - Strongly  suspicious for acute pericarditis.  I believe she likely has effusive constrictive pericarditis.  Would recommend to hold anticoagulation.   - I will start her on colchicine  0.6 mg twice daily.  Her CRP is significantly elevated.  I have been checking an ESR.  I believe we should likely try to treat her inflammation aggressively.  We did discuss that given her acute state she likely needs pericardiocentesis.  I am uncertain if this can be done safely.  We will recheck an echocardiogram to evaluate windows for pericardiocentesis.  She may benefit from pericardial window.  Prior discussions have been against this but I do not believe she has any other options. - She continues to have recurrent pleural effusions. - Start colchicine  0.6 mg twice daily.  We will start anti-inflammatory once her INR level is less than 2.  Would recommend against starting a heparin  drip. - Continue Lasix  40 mg IV twice daily.  Add Aldactone  12.5 mg daily.  Should help with potassium need.  # Volume overload -2/2 effusive constrictive pericarditis   # Permanent Afib - Stop carvedilol .  Continue diltiazem  120 mg every 8 hours.  Can likely consolidate this in the next day or 2. - Would recommend to continue to hold anticoagulation.  We are likely going to treat her for effusive constrictive pericarditis.  In this setting we will hold anticoagulation.  She may end up needing pericardiocentesis and/or pericardial window.     For questions or updates, please contact [facility] Please consult [email or web address] for contact info under        Signed, Darryle T. Barbaraann, MD, [facility]  [facility]  06/25/2024 10:05 AM   "

## 2024-06-25 NOTE — Procedures (Signed)
 Thoracentesis  Procedure Note  JNYA BROSSARD  [id number]  [birth year]  Date:06/25/24  Time:11:47 AM   Provider Performing:Cayley Pester D. Harris   Procedure: Thoracentesis without imaging guidance (67445)  Indication(s) Pleural Effusion  Consent Risks of the procedure as well as the alternatives and risks of each were explained to the patient and/or caregiver.  Consent for the procedure was obtained and is signed in the bedside chart  Anesthesia Topical only with 1% lidocaine     Time Out Verified patient identification, verified procedure, site/side was marked, verified correct patient position, special equipment/implants available, medications/allergies/relevant history reviewed, required imaging and test results available.   Sterile Technique Maximal sterile technique including full sterile barrier drape, hand hygiene, sterile gown, sterile gloves, mask, hair covering, sterile ultrasound probe cover (if used).  Procedure Description Ultrasound was used prior to procedure to identify appropriate pleural anatomy for placement and overlying skin marked.  Area of drainage cleaned and draped in sterile fashion. Lidocaine  was used to anesthetize the skin and subcutaneous tissue.  600 cc's of bloody appearing fluid was drained from the left pleural space. Catheter then removed and bandaid applied to site.   Complications/Tolerance None; patient tolerated the procedure well. Chest X-ray is ordered to confirm no post-procedural complication.   EBL Minimal   Specimen(s) Pleural fluid   Aneesa Romey D. Harris, NP-C [facility] Personal contact information can be found on Amion  If no contact or response made please call [phone] 06/25/2024, 11:48 AM

## 2024-06-25 NOTE — Progress Notes (Signed)
 Assisted with left thoracentesis. VSS throughout procedure. Approximately removed. Specimen walked down to lab. Consent placed in chart. CXR ordered.

## 2024-06-25 NOTE — Progress Notes (Addendum)
 "  NAME:  [name], MRN:  [id number], DOB:  [birth year], LOS: 4 ADMISSION DATE:  06/21/2024, CONSULTATION DATE:  06/21/24 REFERRING MD:  Dr. Tobie , CHIEF COMPLAINT:  shortness of breath   History of Present Illness:  [name] is a 84 y.o. female with PMH of atrial fibrillation, OSA, hypothyroidism, HFpEF, and recent admission for hypotension, AKI, coumadin  coagulapathy and pericardial effusion discharged to SNF yesterday 06/20/24, who presents back to the ED today for shortness of breath and hypoxia while working with PT at SNF, reportedly SPO2 dropped to 80% on baseline O2, and was increased to 8L [place]. During recent hospitalization patient was started on lasix  1/9 for concern of volume overload, but hospital PT notes mention patient would desat into the 80s with exertion at that time. Today patient reports she is always short of breath but she did notice more shortness of breath with PT today and it took a while to get her SPO2 > 90%. At time of consult patient is back down to 3 L [place] with SPO2 > 90% and she is feeling better at rest. She denies any other complaints. BNP noted to be ~4,000 from 6,000 a few days prior, CXR concerning for edema. On POCUS small left effusion noted but no safe window for thoracentesis, and should likely improve with further diuresis. Repeat limited echo is ordered for evaluation of the pericardial effusion noted last admission.   Pertinent  Medical History   has a past medical history of Anxiety, Arthritis, Cancer (HCC), Cataracts, bilateral, Chronic back pain, Family history of adverse reaction to anesthesia, History of blood transfusion, History of bronchitis (05/2015), History of colon polyps, Hyperlipidemia, Hypertension, Hypothyroidism, Ischemic colitis, Joint pain, Joint swelling, OSA on CPAP, PE (pulmonary embolism), Peripheral edema, Pneumonia (1992), Sarcoidosis, Shortness of breath, Urinary frequency, Weakness, and Wheeze.   Significant Hospital  Events: Including procedures, antibiotic start and stop dates in addition to other pertinent events   1/13 consult for hypoxia, new baseline is 3L [place] from recent hospitalization  1/15 underwent left thoracentesis with 900 mL of bloody tinged fluid removed 1/17 Recurrent pleural effusion with increased dyspnea and oxygen  demand to day   Interim History / Subjective:  Seen sitting up in bed eating breakfast with report worsening dyspnea. Agrees for repeat thoracentesis   Objective    Blood pressure (!) 116/58, pulse 92, temperature 97.9 F (36.6 C), temperature source Oral, resp. rate 20, height 5' 4 (1.626 m), weight 102.1 kg, SpO2 90%.        Intake/Output Summary (Last 24 hours) at 06/25/2024 9071 Last data filed at 06/25/2024 0444 Gross per 24 hour  Intake --  Output 750 ml  Net -750 ml   Filed Weights   06/21/24 2125  Weight: 102.1 kg    Examination: General: Acute on chronically ill appearing elderly female sitting up in bed, in NAD HEENT: [place]/AT, MM pink/moist, PERRL,  Neuro: Alert and oriented x3,  CV: s1s2 regular rate and rhythm, no murmur, rubs, or gallops,  PULM:  Diminished bases, mild tachypnea with mild increased work of breathing  GI: soft, bowel sounds active in all 4 quadrants, non-tender, non-distended, tolerating oral diet  Extremities: warm/dry, no edema  Skin: no rashes or lesions  Resolved problem list   Assessment and Plan  Acute on chronic hypoxic respiratory failure (baseline 3L) worsened with exertion -Multifactorial including bilateral pleural effusions in the setting of likely constrictive cardiomyopathy and moderate pericardial effusion. Pericardial effusion P: POCUS with safe left window,  right slightly smaller Will precede with repeat left thora  Continue to diurese per primary  Wean oxygen  as able   Pulmonary will continue to follow   Signature:  Linda Mort D. Harris, NP-C [facility] Personal contact information  can be found on Amion  If no contact or response made please call [phone] 06/25/2024, 9:52 AM      "

## 2024-06-25 NOTE — Progress Notes (Signed)
 "                                                                                                                                                                                                                                                                                PROGRESS NOTE     Patient Demographics:    [name], is a 84 y.o. female, DOB - 1940/09/26, FMW:[id number]  Outpatient Primary MD for the patient is [name] MATSU, MD    LOS - 4  Admit date - 06/21/2024    Chief Complaint  Patient presents with   Shortness of Breath       Brief Narrative (HPI from H&P)   84 y.o. female with past medical history  of   thyroid  cancer, hyperlipidemia, hypertension, hypothyroid, anxiety, arthritis, OSA on CPAP, and history of sarcoidosis Most recently she was admitted from June 10, 2024 with shock, Staphylococcus epidermidis bacteremia, acute kidney injury, hyperkalemia, and moderate pericardial effusion and discharged June 20, 2024, presented back to ED with shortness of breath.   During recent hospitalization, She was initially admitted to the ICU ,received fresh frozen plasma, vitamin K , lokelma , bicarbonate infusion, and empiric antibiotics. Echocardiography revealed an ejection fraction of 45-50% with a large pericardial effusion without tamponade. Pt had S. epidermidis bacteremia / completed 7 days of IV linezolid .  Patient was seen by cardiology on January 7 for her pericardial effusion with no tamponade on echo with plan for recheck echocardiogram.  Patient was discharged on 5 L of oxygen .  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  Diagnosed with acute hypoxic respiratory failure.  Transferred to my care on 06/23/2024.    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  Patient currently on 10 L nasal cannula oxygen , has been seen by  cardiology and pulmonary, left-sided pleural effusion has progressed considerably hence IR requested to do ultrasound-guided therapeutic and diagnostic thoracentesis on 06/23/2024, continue supportive care, CTA was nonacute without any PE.  There is a question of aspiration pneumonia for which she finished antibiotics and cleared by speech.  Continue diuresis as tolerated with IV diuretics, she was much improved on 06/24/2024 after left-sided thoracentesis on 06/23/2024 with 900 cc of fluid removed, fluid appears transudative diuresis is being continued, unfortunately on 06/25/2024 pleural effusion has reoccurred left more than right will re consult pulmonary for management of recurrent effusion.   Permanent atrial fibrillation/history of PE: Rate controlled.  Continue Coreg  and Cardizem .  On Coumadin , INR slightly supratherapeutic.  Pharmacy managing Coumadin  dose/heparin  once INR is subtherapeutic in case cardiology wants to do a pericardiocentesis.  Lab Results  Component Value Date   INR 2.8 (H) 06/25/2024   INR 3.3 (H) 06/24/2024   INR 3.9 (H) 06/23/2024   PROTIME 25.2 (H) 11/29/2014   Hypertension: Blood pressure controlled.  Continue Cardizem , holding home dose of Avapro .   GERD:   on PPI.   Hypokalemia, hypophosphatemia.  Replaced.    Acquired hypothyroidism: Continue Synthroid .   OSA: Nightly CPAP ordered.   Anemia of chronic disease: Stable.   Obesity.  BMI 38.  Follow-up with PCP.  Type 2 diabetes mellitus:   Not on any medications PTA.  Currently on SSI.  Lab Results  Component Value Date   HGBA1C 7.6 (H) 06/10/2024   CBG  (last 3)  Recent Labs    06/25/24 0022 06/25/24 0442 06/25/24 0801  GLUCAP 93 85 105*        Condition - Extremely Guarded  Family Communication  : Husband [name] [phone]  called on 06/23/2024 at 8:07 AM  Code Status : Full code  Consults  : IR, cardiology, pulmonary  PUD Prophylaxis :     Procedures  :     Left ultrasound-guided thoracentesis by IR on 06/23/2024.  900 cc fluid removed, chemistries pending  CTA.  1. No evidence of pulmonary embolism. 2. Small to moderate size right pleural effusion with associated basilar atelectasis. Small left pleural effusion with associated basilar atelectasis. 3. Subtle hazy density over the perihilar region of the left upper lobe possibly due to minimal asymmetric edema and less likely early infection. 4. Mild stable cardiomegaly with interval decrease in pericardial effusion. Atherosclerotic coronary artery disease. 5. Aortic atherosclerosis. 6. Evidence of old granulomatous disease. Aortic Atherosclerosis (ICD10-I70.0).  Cardiac MRI.  1. There is a moderate pericardial effusion. The inferior vena cava is dilated with reduced respiratory collapse, consistent with elevated right sided filling pressures. In diastole, the left ventricle has a D shaped configuration with accentuated respiratory variation in septal position (septal bounce) on strain imaging and with free breathing, indicating ventricular interdependence. This is seen during inspiration and can be seen in constrictive physiology. 2. Normal left ventricular systolic function (LVEF =61%). 3. Decreased right ventricular systolic function (RVEF =38%). 4. There are bilateral pleural effusions with secondary atelectasis  Echocardiogram -  1. Moderate pericardial effusion which is more prominent up to 2.0 cm posterior to the LV (best seen subcostal; image 49). The RV is poorly visualized, but does not appear to collapse in diastole. The IVC is dilated. There is evidence of respiratory  variation in septal movement on image 41. Findings are suggestive of a clinically significant pericardial effusion. Clinical correlation is recommended. Appearance is stable compared to prior study. Moderate pericardial effusion. The pericardial effusion  is  circumferential.  2. Right ventricular systolic function was not well visualized. The right ventricular size is not well visualized. Tricuspid regurgitation signal is inadequate for assessing PA pressure.  3. The mitral valve is grossly normal. Trivial mitral valve regurgitation. No evidence of mitral stenosis.  4. The aortic valve is tricuspid. Aortic valve regurgitation is not visualized. Aortic valve sclerosis is present, with no evidence of aortic valve stenosis.  5. The inferior vena cava is dilated in size with <50% respiratory variability, suggesting right atrial pressure of 15 mmHg.  6. Left ventricular ejection fraction, by estimation, is 60 to 65%. The left ventricle has normal function. Left ventricular endocardial border not optimally defined to evaluate regional wall motion. Comparison(s): No significant change from prior study.      Disposition Plan  :    Status is: Inpatient PPI  DVT Prophylaxis  : Coumadin   Lab Results  Component Value Date   INR 2.8 (H) 06/25/2024   INR 3.3 (H) 06/24/2024   INR 3.9 (H) 06/23/2024   PROTIME 25.2 (H) 11/29/2014     Lab Results  Component Value Date   PLT 103 (L) 06/25/2024    Diet :  Diet Order             DIET DYS 3 Room service appropriate? Yes; Fluid consistency: Thin  Diet effective now                    Inpatient Medications  Scheduled Meds:  carvedilol   3.125 mg Oral BID WC   colchicine   0.6 mg Oral BID   diltiazem   120 mg Oral Q8H   furosemide   40 mg Intravenous BID   insulin  aspart  0-9 Units Subcutaneous Q4H   lactose free nutrition  237 mL Oral BID WC   levothyroxine   112 mcg Oral q morning   pantoprazole   40 mg Oral Daily   sodium chloride  flush  3 mL  Intravenous Q12H   Continuous Infusions:   PRN Meds:.acetaminophen  **OR** acetaminophen , alum & mag hydroxide-simeth, artificial tears, guaiFENesin , LORazepam , ondansetron  **OR** ondansetron  (ZOFRAN ) IV, polyethylene glycol     Objective:   Vitals:   06/24/24 1942 06/25/24 0019 06/25/24 0440 06/25/24 0803  BP: (!) 110/56 107/60 (!) 113/51 (!) 116/58  Pulse: 68 73 92   Resp: 14 (!) 25 20   Temp: 97.7 F (36.5 C) 97.7 F (36.5 C) (!) 97.5 F (36.4 C) 97.9 F (36.6 C)  TempSrc: Oral Oral Oral Oral  SpO2: 94% 94% 90%   Weight:      Height:        Wt Readings from Last 3 Encounters:  06/21/24 102.1 kg  06/20/24 108.7 kg  05/27/24 109.3 kg     Intake/Output Summary (Last 24 hours) at 06/25/2024 0908 Last data filed at 06/25/2024 0444 Gross per 24 hour  Intake --  Output 750 ml  Net -750 ml     Physical Exam  Awake Alert, No new F.N deficits, in mild respiratory distress .AT,PERRAL Supple Neck, No JVD,   Symmetrical Chest wall movement, Good air movement bilaterally, reduced bibasilar breath sounds left more reduced than right RRR,No Gallops,Rubs or new Murmurs,  +ve B.Sounds, Abd Soft, No tenderness,   1+ chronic bilateral lower extremity edema     RN pressure injury documentation: Wound 06/22/24 1541 Pressure Injury Sacrum Medial Stage 2 -  Partial thickness loss of dermis presenting as a shallow open injury with a red, pink wound bed without slough. (Active)  Data Review:    Recent Labs  Lab 06/21/24 1237 06/22/24 0404 06/23/24 0740 06/24/24 0411 06/25/24 0436  WBC 10.9* 10.0 8.4 7.6 7.4  HGB 10.7* 10.1* 10.3* 9.7* 9.2*  HCT 33.8* 32.6* 31.8* 30.2* 28.6*  PLT 175 156 109* 103* 103*  MCV 93.1 93.4 91.6 92.6 91.7  MCH 29.5 28.9 29.7 29.8 29.5  MCHC 31.7 31.0 32.4 32.1 32.2  RDW 16.4* 16.4* 16.5* 16.5* 16.6*  LYMPHSABS 0.7 0.9 1.0 1.3 1.6  MONOABS 0.9 1.0 1.2* 1.6* 2.1*  EOSABS 0.1 0.1 0.1 0.1 0.1  BASOSABS 0.0 0.0 0.0 0.0 0.0    Recent  Labs  Lab 06/20/24 0447 06/21/24 1237 06/21/24 1501 06/21/24 2223 06/22/24 0404 06/23/24 0300 06/23/24 0740 06/24/24 0411 06/25/24 0436  NA 128* 133*  --   --  137  --  139 140 141  K 4.1 4.2  --   --  3.8  --  3.4* 3.4* 3.5  CL 90* 93*  --   --  95*  --  91* 93* 93*  CO2 29 32  --   --  33*  --  38* 39* 41*  ANIONGAP 9 9  --   --  8  --  10 8 6   GLUCOSE 87 134*  --   --  106*  --  95 86 88  BUN 25* 22  --   --  20  --  16 17 19   CREATININE 1.01* 0.94  --   --  0.82  --  0.68 0.64 0.68  AST  --  26  --   --   --   --  29  --   --   ALT  --  16  --   --   --   --  19  --   --   ALKPHOS  --  154*  --   --   --   --  147*  --   --   BILITOT  --  0.6  --   --   --   --  0.6  --   --   ALBUMIN  --  2.8*  --   --   --   --  2.8*  --   --   CRP  --   --   --   --   --   --  7.6*  --   --   PROCALCITON  --   --   --   --   --   --  <0.10  --   --   LATICACIDVEN  --   --  2.7* 1.8  --   --   --   --   --   INR 2.2* 2.6*  --   --  3.5* 3.9*  --  3.3* 2.8*  MG 1.9  --   --   --  1.9  --  1.7 1.9 2.1  PHOS  --   --   --   --   --   --   --  2.1* 2.6  CALCIUM  8.5* 8.3*  --   --  8.3*  --  8.4* 8.3* 8.6*      Recent Labs  Lab 06/20/24 0447 06/21/24 1237 06/21/24 1501 06/21/24 2223 06/22/24 0404 06/23/24 0300 06/23/24 0740 06/24/24 0411 06/25/24 0436  CRP  --   --   --   --   --   --  7.6*  --   --  PROCALCITON  --   --   --   --   --   --  <0.10  --   --   LATICACIDVEN  --   --  2.7* 1.8  --   --   --   --   --   INR 2.2* 2.6*  --   --  3.5* 3.9*  --  3.3* 2.8*  MG 1.9  --   --   --  1.9  --  1.7 1.9 2.1  CALCIUM  8.5* 8.3*  --   --  8.3*  --  8.4* 8.3* 8.6*    --------------------------------------------------------------------------------------------------------------- Lab Results  Component Value Date   CHOL 242 (H) 01/12/2023   HDL 62 01/12/2023   LDLCALC 157 (H) 01/12/2023   TRIG 130 01/12/2023   CHOLHDL 3.9 01/12/2023    Lab Results  Component Value Date    HGBA1C 7.6 (H) 06/10/2024   No results for input(s): TSH, T4TOTAL, FREET4, T3FREE, THYROIDAB in the last 72 hours. No results for input(s): VITAMINB12, FOLATE, FERRITIN, TIBC, IRON, RETICCTPCT in the last 72 hours. ------------------------------------------------------------------------------------------------------------------ Cardiac Enzymes No results for input(s): CKMB, TROPONINI, MYOGLOBIN in the last 168 hours.  Invalid input(s): CK  Micro Results Recent Results (from the past 240 hours)  Culture, blood (Routine X 2) w Reflex to ID Panel     Status: None   Collection Time: 06/16/24  2:22 PM   Specimen: BLOOD LEFT HAND  Result Value Ref Range Status   Specimen Description   Final    BLOOD LEFT HAND Performed at [facility], [address] [place], [place] [address]    Special Requests   Final    BOTTLES DRAWN AEROBIC AND ANAEROBIC Blood Culture adequate volume Performed at [facility], [address] [place], [place] [address]    Culture   Final    NO GROWTH 5 DAYS Performed at [facility], [address], [place], [place] [address]    Report Status 06/21/2024 FINAL  Final  Culture, blood (Routine X 2) w Reflex to ID Panel     Status: None   Collection Time: 06/16/24  2:27 PM   Specimen: BLOOD LEFT HAND  Result Value Ref Range Status   Specimen Description   Final    BLOOD LEFT HAND Performed at [facility], [address] [place], [place] [address]    Special Requests   Final    BOTTLES DRAWN AEROBIC AND ANAEROBIC Blood Culture adequate volume Performed at [facility], [address] [place], [place] [address]    Culture   Final    NO GROWTH 5 DAYS Performed at [facility], [address], [place], [place] [address]    Report Status 06/21/2024 FINAL  Final  Resp panel by RT-PCR (RSV, Flu A&B, Covid) Anterior Nasal Swab     Status: None   Collection Time: 06/21/24  12:26 PM   Specimen: Anterior Nasal Swab  Result Value Ref Range Status   SARS Coronavirus 2 by RT PCR NEGATIVE NEGATIVE Final   Influenza A by PCR NEGATIVE NEGATIVE Final   Influenza B by PCR NEGATIVE NEGATIVE Final    Comment: (NOTE) The Xpert Xpress SARS-CoV-2/FLU/RSV plus assay is intended as an aid in the diagnosis of influenza from Nasopharyngeal swab specimens and should not be used as a sole basis for treatment. Nasal washings and aspirates are unacceptable for Xpert Xpress SARS-CoV-2/FLU/RSV testing.  Fact Sheet for Patients:  bloggercourse.com  Fact Sheet for Healthcare Providers: seriousbroker.it  This test is not yet approved or cleared by the [place]  FDA and has been authorized for detection and/or diagnosis of SARS-CoV-2 by FDA under an Emergency Use Authorization (EUA). This EUA will remain in effect (meaning this test can be used) for the duration of the COVID-19 declaration under Section 564(b)(1) of the Act, 21 U.S.C. section 360bbb-3(b)(1), unless the authorization is terminated or revoked.     Resp Syncytial Virus by PCR NEGATIVE NEGATIVE Final    Comment: (NOTE) Fact Sheet for Patients: bloggercourse.com  Fact Sheet for Healthcare Providers: seriousbroker.it  This test is not yet approved or cleared by the [place]  FDA and has been authorized for detection and/or diagnosis of SARS-CoV-2 by FDA under an Emergency Use Authorization (EUA). This EUA will remain in effect (meaning this test can be used) for the duration of the COVID-19 declaration under Section 564(b)(1) of the Act, 21 U.S.C. section 360bbb-3(b)(1), unless the authorization is terminated or revoked.  Performed at [facility], [address] [place], [place] [address]     Radiology Report DG Chest Port 1 View Result Date: 06/25/2024 EXAM: 1 VIEW XRAY OF THE CHEST  06/25/2024 06:44:00 AM COMPARISON: Portable chest and CTA chest, both dated 06/23/2024. CLINICAL HISTORY: SOB (shortness of breath) FINDINGS: LUNGS AND PLEURA: Left greater than right pleural effusions are noted. Patchy consolidation or atelectasis is present in the left greater than right lower lung fields. The remaining lungs appear clear. No pneumothorax. Overall aeration seems unchanged. HEART AND MEDIASTINUM: The heart is enlarged. There is central vascular prominence without appreciable edema. Mediastinum is stable with aortic atherosclerosis. BONES AND SOFT TISSUES: Thoracic spondylosis with no new osseous findings. IMPRESSION: 1. Left greater than right pleural effusions with associated patchy consolidation or atelectasis in the left greater than right lower lung fields. Stable overall aeration. 2. Enlarged heart with central vascular prominence without appreciable edema. Electronically signed by: Francis Quam MD 06/25/2024 07:21 AM EST RP Workstation: [id number]   DG ESOPHAGUS W SINGLE CM (SOL OR THIN BA) Result Date: 06/24/2024 CLINICAL DATA:  84 year old female recently admitted with septic shock from staph bacteremia, readmitted 1 day after discharge with acute on chronic respiratory and heart failure, and pericardial and pleural effusions. Possible aspiration pneumonia. Patient is noted with difficulty swallowing liquids, solids, and pills. EXAM: ESOPHAGUS/BARIUM SWALLOW/TABLET STUDY TECHNIQUE: Single contrast examination was performed using thin liquid barium. This exam was performed by Carlin Griffon, PA-C, and was supervised and interpreted by Dr. Ryan Salvage, MD. FLUOROSCOPY: Radiation Exposure Index (as provided by the fluoroscopic device): 58.10 mGy Kerma COMPARISON:  None Available. FINDINGS: Swallowing: Appears normal. No vestibular penetration or aspiration seen. Pharynx: Unremarkable. Esophagus: Likely mild dilation of proximal esophagus. Esophageal motility: Moderate to severe  dysmotility with poor primary peristalsis, stasis of barium and esophagus despite table tilt, notable proximal escape with regurgitation (series 5, image 73/83), and poor emptying at LES into gastric lumen. Hiatal Hernia: None noted. Gastroesophageal reflux: None visualized despite provocative maneuvers (series 6). Ingested 13mm barium tablet: Became stuck longer than 5 minutes. Other: Study is severely limited by patient's inability to position adequately for study. Only AP imaging was possible. The entirety of the study was performed at 20 degrees tilt. IMPRESSION: Moderate to severe esophageal dysmotility, Inability of barium tablet to pass through the distal esophagus. Consider upper endoscopy to evaluate for benign or malignant stricturing. No lesion appreciated on CT 1 day prior. Electronically Signed   By: Jackquline  Malva M.D.   On: 06/24/2024 14:19   CT Angio Chest Pulmonary Embolism (PE) W or WO Contrast Result Date: 06/23/2024 CLINICAL DATA:  Possible pulmonary embolism. Recent left thoracentesis. EXAM: CT ANGIOGRAPHY CHEST WITH CONTRAST TECHNIQUE: Multidetector CT imaging of the chest was performed using the standard protocol during bolus administration of intravenous contrast. Multiplanar CT image reconstructions and MIPs were obtained to evaluate the vascular anatomy. RADIATION DOSE REDUCTION: This exam was performed according to the departmental dose-optimization program which includes automated exposure control, adjustment of the mA and/or kV according to patient size and/or use of iterative reconstruction technique. CONTRAST:  75mL OMNIPAQUE  IOHEXOL  350 MG/ML SOLN COMPARISON:  CT 06/10/2024 FINDINGS: Cardiovascular: Mild stable cardiomegaly with interval decrease in pericardial effusion. Mild calcification over the left main and 3 vessel coronary arteries. Thoracic aorta is normal caliber. Mild calcified plaque over the descending thoracic aorta. Pulmonary arterial system is well opacified  without evidence of emboli. Remaining vascular structures are unremarkable. Mediastinum/Nodes: No significant adenopathy over the hilar mediastinal regions. There are multiple calcified mediastinal and hilar lymph nodes. Remaining mediastinal structures are unremarkable. Lungs/Pleura: Lungs are adequately inflated. There is a small left pleural effusion with associated basilar atelectasis. There is a small to moderate size right pleural effusion with associated basilar atelectasis. No pneumothorax. Bilateral calcified granulomas are present. Mild atelectatic change over the lingula and dependent left upper lobe. Subtle hazy density over the perihilar region of the left upper lobe possibly due to minimal asymmetric edema and less likely early infection. Mild tracheobronchomalacia new compared to the prior exam as this is transient. Upper Abdomen: Mild calcified plaque over the abdominal aorta. No acute findings of the visualized upper abdominal structures. Musculoskeletal: No focal abnormality. Review of the MIP images confirms the above findings. IMPRESSION: 1. No evidence of pulmonary embolism. 2. Small to moderate size right pleural effusion with associated basilar atelectasis. Small left pleural effusion with associated basilar atelectasis. 3. Subtle hazy density over the perihilar region of the left upper lobe possibly due to minimal asymmetric edema and less likely early infection. 4. Mild stable cardiomegaly with interval decrease in pericardial effusion. Atherosclerotic coronary artery disease. 5. Aortic atherosclerosis. 6. Evidence of old granulomatous disease. Aortic Atherosclerosis (ICD10-I70.0). Electronically Signed   By: Mclean Agreste M.D.   On: 06/23/2024 10:04   DG Chest 1 View Result Date: 06/23/2024 EXAM: 1 VIEW(S) XRAY OF THE CHEST 06/23/2024 09:34:00 AM COMPARISON: 06/23/2024 CLINICAL HISTORY: Pleural effusion on left. FINDINGS: LUNGS AND PLEURA: Bibasilar homogeneous opacities. Mild pulmonary  edema. Significant interval decrease in left pleural effusion, now small. Small right pleural effusion. No pneumothorax. HEART AND MEDIASTINUM: Cardiomegaly. Aortic arch atherosclerosis. Calcified mediastinal and hilar lymph nodes. BONES AND SOFT TISSUES: No acute osseous abnormality. IMPRESSION: 1. Significant interval decrease in left pleural effusion, now small. 2. Small right pleural effusion. 3. Cardiomegaly with mild pulmonary edema and bibasilar homogeneous opacities; aortic arch atherosclerosis and calcified mediastinal/hilar lymph nodes. Electronically signed by: Evalene Coho MD 06/23/2024 09:38 AM EST RP Workstation: [id number]   IR THORACENTESIS LEFT ASP PLEURAL SPACE W/IMG GUIDE Result Date: 06/23/2024 INDICATION: 84 year old female with a PMHx of CHF and sarcoidosis with a large left sided pleural effusion. IR received request for diagnostic and therapeutic left thoracentesis. This is patients first thoracentesis. EXAM: ULTRASOUND GUIDED DIAGNOSTIC AND THERAPEUTIC LEFT THORACENTESIS MEDICATIONS: 9mL of 1% Lidocaine  COMPLICATIONS: None immediate. PROCEDURE: An ultrasound guided thoracentesis was thoroughly discussed with the patient and questions answered. The benefits, risks, alternatives and complications were also discussed.  The patient understands and wishes to proceed with the procedure. Written consent was obtained. Ultrasound was performed to localize and mark an adequate pocket of fluid in the left chest. The area was then prepped and draped in the normal sterile fashion. 1% Lidocaine  was used for local anesthesia. Under ultrasound guidance a 6 Fr Safe-T-Centesis catheter was introduced. Thoracentesis was performed. The catheter was removed and a dressing applied. FINDINGS: A total of approximately of blood tinged fluid was removed. Samples were sent to the laboratory as requested by the clinical team. IMPRESSION: Successful ultrasound guided left thoracentesis yielding 900 mL of  pleural fluid. Performed by Thersia Rummer, PA-C Electronically Signed   By: Marcey Moan M.D.   On: 06/23/2024 09:23     Signature  -   Lavada Stank M.D on 06/25/2024 at 9:08 AM   -  To page go to [email or web address]   "

## 2024-06-25 NOTE — Plan of Care (Signed)

## 2024-06-25 NOTE — Progress Notes (Signed)
 Physical Therapy Treatment Patient Details Name: [name] MRN: [id number] DOB: 09-Oct-1940 Today's Date: 06/25/2024   History of Present Illness Pt is 84 year old presented to [facility] on  06/21/24 for SOB. Was just discharged from [facility] to [facility] on 06/20/24. At [facility] pt with bacteremia and pericardial effusion. Pt now with acute hypoxic respiratory failure, pleural effusion, and acute on chronic CHF.  PMH - CHF, HTN, arthritis, anxiety, thyroid  CA, afib, PE, DM, OSA    PT Comments  Pt continues to demonstrate improved tolerance to functional mobility with increased ambulation distance each session. Pt SOB throughout activity but maintains >88% on 4L O2 during functional mobility with seated rest breaks taken between bouts of ambulation. Pt will benefit from chair follow for longer distances. Pt demonstrates improved strength when completing STS, completing faster reps and CGA for balance upon standing. Pt fatigues quickly but is gradually progressing. Pt would benefit from continued PT services focused on transfers, balance, gait, and breathing strategies to promote tolerance and improved independence during functional mobility.    If plan is discharge home, recommend the following: A little help with walking and/or transfers;A lot of help with bathing/dressing/bathroom;Assistance with cooking/housework;Assist for transportation;Help with stairs or ramp for entrance   Can travel by private vehicle     No (Oxygen  requirements)  Equipment Recommendations  None recommended by PT (Pt with appropriate equipment at home)    Recommendations for Other Services       Precautions / Restrictions Precautions Precautions: Fall;Other (comment) Recall of Precautions/Restrictions: Intact Precaution/Restrictions Comments: monitor O2 Restrictions Weight Bearing Restrictions Per Provider Order: No     Mobility  Bed Mobility Overal bed mobility: Needs Assistance Bed Mobility: Supine to Sit      Supine to sit: Supervision, HOB elevated, Contact guard     General bed mobility comments: CGA due to miscelaneous objects in bed, attempted to mostly clear prior to sitting EOB. Takes time to complete transfer and uses bed rails as needed.    Transfers Overall transfer level: Needs assistance Equipment used: Rolling walker (2 wheels) Transfers: Sit to/from Stand Sit to Stand: Contact guard assist           General transfer comment: Pt CGA for STSx3 completed throughout session. Takes additional time to complete first transfer but time to completion improves with additional attempts.    Ambulation/Gait Ambulation/Gait assistance: Contact guard assist Gait Distance (Feet): 12 Feet (12'x6) Assistive device: Rolling walker (2 wheels) Gait Pattern/deviations: Step-through pattern, Decreased stride length, Shuffle, Narrow base of support   Gait velocity interpretation: <1.8 ft/sec, indicate of risk for recurrent falls   General Gait Details: Gradually increasing ambulation distance each session, pt declining ambulating in the hall at this time. x3 laps from recliner to door completed with seated rest breaks in between. Pt SOB, monitored throughout and remained >88% with good pleth on 4L.   Stairs             Wheelchair Mobility     Tilt Bed    Modified Rankin (Stroke Patients Only)       Balance Overall balance assessment: Needs assistance Sitting-balance support: No upper extremity supported, Feet supported Sitting balance-Leahy Scale: Fair Sitting balance - Comments: Balance improves with feet supported on floor. No trunk lean noted.   Standing balance support: Bilateral upper extremity supported, During functional activity, Reliant on assistive device for balance Standing balance-Leahy Scale: Poor Standing balance comment: Dependent on RW. No knee buckling or LOB noted. Maintains fair balance  CGA during turns.                             Communication Communication Communication: Impaired Factors Affecting Communication: Hearing impaired  Cognition Arousal: Alert Behavior During Therapy: WFL for tasks assessed/performed   PT - Cognitive impairments: No apparent impairments                         Following commands: Intact      Cueing Cueing Techniques: Verbal cues, Tactile cues, Visual cues  Exercises      General Comments General comments (skin integrity, edema, etc.): VSS throughout. Quarter sized purple bruise noted on R shoulder blade.      Pertinent Vitals/Pain Pain Assessment Pain Assessment: No/denies pain    Home Living                          Prior Function            PT Goals (current goals can now be found in the care plan section) Acute Rehab PT Goals Patient Stated Goal: return home PT Goal Formulation: With patient/family Time For Goal Achievement: 07/06/24 Potential to Achieve Goals: Good Progress towards PT goals: Progressing toward goals    Frequency    Min 2X/week      PT Plan      Co-evaluation              AM-PAC PT 6 Clicks Mobility   Outcome Measure  Help needed turning from your back to your side while in a flat bed without using bedrails?: None Help needed moving from lying on your back to sitting on the side of a flat bed without using bedrails?: A Little Help needed moving to and from a bed to a chair (including a wheelchair)?: A Little Help needed standing up from a chair using your arms (e.g., wheelchair or bedside chair)?: A Little Help needed to walk in hospital room?: A Lot Help needed climbing 3-5 steps with a railing? : Total 6 Click Score: 16    End of Session Equipment Utilized During Treatment: Gait belt;Oxygen  Activity Tolerance: Patient limited by fatigue (Patient limited by SOB) Patient left: in chair;with call bell/phone within reach;with family/visitor present Nurse Communication: Mobility status PT Visit  Diagnosis: Unsteadiness on feet (R26.81);Muscle weakness (generalized) (M62.81)     Time: 8595-8566 PT Time Calculation (min) (ACUTE ONLY): 29 min  Charges:    $Gait Training: 8-22 mins $Therapeutic Activity: 8-22 mins PT General Charges $$ ACUTE PT VISIT: 1 Visit                     Sabra Morel, PT, DPT  Acute Rehabilitation Services         Office: [phone]      Sabra MARLA Morel 06/25/2024, 4:13 PM

## 2024-06-25 NOTE — Progress Notes (Addendum)
 PHARMACY - ANTICOAGULATION CONSULT NOTE  Pharmacy Consult for heparin  when INR <2 Indication: atrial fibrillation, hx PE, warfarin on hold  Allergies[1]  Patient Measurements: Height: 5' 4 (162.6 cm) Weight: 102.1 kg (225 lb) IBW/kg (Calculated) : 54.7 HEPARIN  DW (KG): 78.5  Vital Signs: Temp: 97.5 F (36.4 C) (01/17 0440) Temp Source: Oral (01/17 0440) BP: 113/51 (01/17 0440) Pulse Rate: 92 (01/17 0440)  Labs: Recent Labs    06/23/24 0300 06/23/24 0740 06/23/24 0740 06/24/24 0411 06/25/24 0436  HGB  --  10.3*   < > 9.7* 9.2*  HCT  --  31.8*  --  30.2* 28.6*  PLT  --  109*  --  103* 103*  LABPROT 39.7*  --   --  35.0* 31.1*  INR 3.9*  --   --  3.3* 2.8*  CREATININE  --  0.68  --  0.64 0.68   < > = values in this interval not displayed.    Estimated Creatinine Clearance: 59.8 mL/min (by C-G formula based on SCr of 0.68 mg/dL).   Medical History: Past Medical History:  Diagnosis Date   Anxiety    takes Lorazepam  daily as needed   Arthritis    Cancer (HCC)    thyroid    Cataracts, bilateral    immature   Chronic back pain    compressed vertebrae,takes Fosamax weekly   Family history of adverse reaction to anesthesia    granddaughter gets very sick and mean   History of blood transfusion    no abnormal reaction noted   History of bronchitis 05/2015   History of colon polyps    benign   Hyperlipidemia    takes Zetia  daily   Hypertension    takes Diltiazem  and Avapro  daily   Hypothyroidism    takes Synthroid  daily   Ischemic colitis    hx of-many yrs ago   Joint pain    Joint swelling    OSA on CPAP    05-08-12 AHi was 46, RDI  53. titrated to   9 cm water   with 3 cm flex.-nadir 75%   PE (pulmonary embolism)    takes Coumadin  daily   Peripheral edema    Pneumonia 1992   hx of   Sarcoidosis    Shortness of breath    Urinary frequency    Weakness    left leg.Numbness and tingling.Has sciatica   Wheeze    occaionally. Not new    Medications:   Medications Prior to Admission  Medication Sig Dispense Refill Last Dose/Taking   acetaminophen  (TYLENOL ) 325 MG tablet Take 325-650 mg by mouth 2 (two) times daily as needed for mild pain or headache.   Past Week   bisacodyl (DULCOLAX) 10 MG suppository Place 10 mg rectally daily as needed for moderate constipation.   Unknown   Carboxymethylcellulose Sodium (REFRESH LIQUIGEL OP) Place 1 drop into both eyes 2 (two) times daily as needed (Dry eye).   Unknown   carvedilol  (COREG ) 3.125 MG tablet Take 1 tablet (3.125 mg total) by mouth 2 (two) times daily with a meal. 60 tablet 0 06/21/2024 Morning   cyanocobalamin (VITAMIN B12) 1000 MCG tablet Take 1,000 mcg by mouth daily.   06/21/2024   diltiazem  (CARDIZEM ) 120 MG tablet Take 1 tablet (120 mg total) by mouth every 8 (eight) hours. 90 tablet 0 06/21/2024 Morning   furosemide  (LASIX ) 40 MG tablet Take 40 mg by mouth daily.   06/21/2024 Morning   levothyroxine  (SYNTHROID ) 112 MCG tablet Take 112 mcg  by mouth every morning.   06/21/2024 Morning   LORazepam  (ATIVAN ) 1 MG tablet Take 1.5 tablets (1.5 mg total) by mouth See admin instructions. Take 1.5 mg by mouth at bedtime and an additional 1-1.5mg  once a day as needed for anxiety (Patient taking differently: Take 1-1.5 mg by mouth See admin instructions. Take 1.5 mg by mouth at bedtime.  PRN order: take 1 mg by mouth every 24 hours as needed for anxiety.) 10 tablet 0 06/21/2024   magnesium  hydroxide (MILK OF MAGNESIA) 400 MG/5ML suspension Take 30 mLs by mouth daily as needed for mild constipation or moderate constipation.   Unknown   melatonin 5 MG TABS Take 1 tablet (5 mg total) by mouth at bedtime as needed (if lorazepam  doesn't work). (Patient taking differently: Take 5 mg by mouth at bedtime as needed (insomnia).)   Unknown   polyethylene glycol (MIRALAX  / GLYCOLAX ) 17 g packet Take 17 g by mouth daily as needed for mild constipation. 14 each 0 Unknown   senna-docusate (SENOKOT-S) 8.6-50 MG tablet Take  1-2 tablets by mouth 2 (two) times daily between meals as needed for mild constipation or moderate constipation. (Patient taking differently: Take 2 tablets by mouth 2 (two) times daily as needed for mild constipation or moderate constipation.)   Past Week   SIMPLY SALINE NA Place 1 spray into both nostrils every 12 (twelve) hours as needed (for dryness).   Past Week   sodium chloride  1 g tablet Take 1 tablet (1 g total) by mouth 3 (three) times daily with meals. 30 tablet 0 06/21/2024 Morning   Sodium Phosphates (ENEMA RE) Place 1 Dose rectally daily as needed (constipation).   Unknown   warfarin (COUMADIN ) 5 MG tablet Take 5 mg by mouth at bedtime.  2 06/20/2024 at 10:00 PM   diltiazem  (CARDIZEM  CD) 360 MG 24 hr capsule Take 360 mg by mouth at bedtime. (Patient not taking: Reported on 06/21/2024)   Not Taking   irbesartan  (AVAPRO ) 75 MG tablet Take 75 mg by mouth daily. (Patient not taking: Reported on 06/21/2024)   Not Taking   nebivolol  (BYSTOLIC ) 5 MG tablet Take 5 mg by mouth daily. (Patient not taking: Reported on 06/21/2024)   Not Taking   nystatin  cream (MYCOSTATIN ) Apply 1 Application topically 2 (two) times daily as needed for dry skin. (Patient not taking: Reported on 06/21/2024)   Not Taking   triamcinolone cream (KENALOG) 0.1 % Apply 1 Application topically 2 (two) times daily as needed (Irritation). (Patient not taking: Reported on 06/21/2024)   Not Taking    Assessment: [name] is a 84 y.o. year old female presented on 06/21/2024 with concern for shob found to have aspiration pneumonia, pericardial effusion, and pleural effusion. On warfarin prior to admission for afib and hx of PE. Prior to admission regimen is warfarin 5mg  PO daily. Last dose prior to admission 1/12 2200. Pharmacy initially consulted for warfarin inpatient management.   Date INR Warfarin Dose  1/12 2.2, therapeutic  5mg  (pta)   1/13 2.6, therapeutic  5mg    1/14 3.5, supra-therapeutic    1/15 3.9,  supra-therapeutic   1/16 3.3, supra-therapeutic   1/17 2.8, supra-therapeutic    Patient s/p thoracentesis 1/15 with fluid removed.  Pharmacy consulted to start heparin  when INR <2 in the event repeat thoracentesis or pericardial effusion drainage needed.  Per RN, some nasal bleeding due to oxygen  but no other signs of bleeding.  INR 2.8, No heparin  at this time.  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  Prentice DOROTHA Favors, PharmD PGY1 Health-System Pharmacy Administration and Leadership Resident [facility]  06/25/2024 7:39 AM              [1]  Allergies Allergen Reactions   Atenolol Other (See Comments)    Depression    Citalopram Hydrobromide Nausea Only and Other (See Comments)    Nausea/Fatigue   Clindamycin /Lincomycin Other (See Comments)    Via IV, has caused a metallic taste in the mouth    Codeine Other (See Comments)    Caused Hyperactivity and Dysphoria   Coenzyme Q10 Itching   Erythromycin Itching, Anxiety and Other (See Comments)    Also with jitters   Motrin  [Ibuprofen ] Other (See Comments)    Is to not take this   Statins Other (See Comments)    Leg muscle weakness w/ rosuvastatin and simvastatin    Latex Rash   Losartan Potassium Other (See Comments)    Muscle aches

## 2024-06-26 ENCOUNTER — Inpatient Hospital Stay (HOSPITAL_COMMUNITY)

## 2024-06-26 DIAGNOSIS — I3139 Other pericardial effusion (noninflammatory): Secondary | ICD-10-CM

## 2024-06-26 DIAGNOSIS — R0602 Shortness of breath: Secondary | ICD-10-CM | POA: Diagnosis not present

## 2024-06-26 LAB — CBC
HCT: 27.7 % — ABNORMAL LOW (ref 36.0–46.0)
Hemoglobin: 9.2 g/dL — ABNORMAL LOW (ref 12.0–15.0)
MCH: 30.4 pg (ref 26.0–34.0)
MCHC: 33.2 g/dL (ref 30.0–36.0)
MCV: 91.4 fL (ref 80.0–100.0)
Platelets: 109 K/uL — ABNORMAL LOW (ref 150–400)
RBC: 3.03 MIL/uL — ABNORMAL LOW (ref 3.87–5.11)
RDW: 16.4 % — ABNORMAL HIGH (ref 11.5–15.5)
WBC: 6.5 K/uL (ref 4.0–10.5)
nRBC: 0 % (ref 0.0–0.2)

## 2024-06-26 LAB — GLUCOSE, CAPILLARY
Glucose-Capillary: 91 mg/dL (ref 70–99)
Glucose-Capillary: 107 mg/dL — ABNORMAL HIGH (ref 70–99)
Glucose-Capillary: 134 mg/dL — ABNORMAL HIGH (ref 70–99)
Glucose-Capillary: 120 mg/dL — ABNORMAL HIGH (ref 70–99)
Glucose-Capillary: 167 mg/dL — ABNORMAL HIGH (ref 70–99)

## 2024-06-26 LAB — PROTEIN, BODY FLUID (OTHER): Total Protein, Body Fluid Other: 3.1 g/dL

## 2024-06-26 LAB — BASIC METABOLIC PANEL WITH GFR
Anion gap: 8 (ref 5–15)
BUN: 16 mg/dL (ref 8–23)
CO2: 37 mmol/L — ABNORMAL HIGH (ref 22–32)
Calcium: 8.8 mg/dL — ABNORMAL LOW (ref 8.9–10.3)
Chloride: 91 mmol/L — ABNORMAL LOW (ref 98–111)
Creatinine, Ser: 0.68 mg/dL (ref 0.44–1.00)
GFR, Estimated: >60 mL/min
Glucose, Bld: 98 mg/dL (ref 70–99)
Potassium: 3.3 mmol/L — ABNORMAL LOW (ref 3.5–5.1)
Sodium: 137 mmol/L (ref 135–145)

## 2024-06-26 LAB — ECHOCARDIOGRAM LIMITED
Area-P 1/2: 5.66 cm2
Height: 64 in
MV M vel: 3.95 m/s
MV Peak grad: 62.4 mmHg
S' Lateral: 3.5 cm
Weight: 3600 [oz_av]

## 2024-06-26 LAB — GLUCOSE, BODY FLUID OTHER: Glucose, Body Fluid Other: 139 mg/dL

## 2024-06-26 LAB — PROTIME-INR
INR: 2.2 — ABNORMAL HIGH (ref 0.8–1.2)
Prothrombin Time: 25.9 s — ABNORMAL HIGH (ref 11.4–15.2)

## 2024-06-26 LAB — ALBUMIN, FLUID (OTHER): Albumin, Body Fluid Other: 1.5 g/dL

## 2024-06-26 LAB — LD, BODY FLUID (OTHER): LD, Body Fluid: 265 IU/L

## 2024-06-26 LAB — MAGNESIUM: Magnesium: 1.8 mg/dL (ref 1.7–2.4)

## 2024-06-26 MED ORDER — DILTIAZEM HCL ER COATED BEADS 180 MG PO CP24
360.0000 mg | ORAL_CAPSULE | Freq: Every day | ORAL | Status: DC
  Administered 2024-06-26 – 2024-07-01 (×6): 360 mg via ORAL
  Filled 2024-06-26 (×6): qty 2

## 2024-06-26 MED ORDER — VITAMIN K1 10 MG/ML IJ SOLN
5.0000 mg | Freq: Once | INTRAVENOUS | Status: AC
  Administered 2024-06-26: 5 mg via INTRAVENOUS
  Filled 2024-06-26: qty 0.5

## 2024-06-26 MED ORDER — POTASSIUM CHLORIDE CRYS ER 20 MEQ PO TBCR
40.0000 meq | EXTENDED_RELEASE_TABLET | Freq: Two times a day (BID) | ORAL | Status: DC
  Administered 2024-06-26 – 2024-06-27 (×3): 40 meq via ORAL
  Filled 2024-06-26 (×3): qty 2

## 2024-06-26 MED ORDER — POTASSIUM CHLORIDE CRYS ER 20 MEQ PO TBCR
40.0000 meq | EXTENDED_RELEASE_TABLET | Freq: Once | ORAL | Status: AC
  Administered 2024-06-26: 40 meq via ORAL
  Filled 2024-06-26: qty 2

## 2024-06-26 NOTE — Consult Note (Signed)
 "   [address] [place] [address]             [phone]    [name] [facility] Medical Record #[id number] Date of Birth: April 26, 1941  Referring: No ref. provider found Primary Care: [name] MATSU, MD Primary Cardiologist:Linda Floretta, MD  Chief Complaint:    Chief Complaint  Patient presents with   Shortness of Breath    History of Present Illness:     [name] is an 84 year old woman with history of permanent A-fib, thyroid  cancer, hyperlipidemia, hypertension, hypothyroid, OSA on CPAP, and history of sarcoidosis who presented in early January with staph epi bacteremia, AKI, hyperkalemia and a moderate pericardial effusion.  She was subsequently discharged and then presented back on January 13 with shortness of breath.  CT scan and echo have demonstrated a large pericardial effusion without tamponade.  TCTS is consulted for possible pericardial window.  Currently she is hemodynamically stable and has been undergoing diuresis.  She was previously on Coumadin  which has been held pending and the need for the late intervention.  Per cardiology, they are concerned that she has an effusive constrictive pericarditis and would benefit from drainage of her effusion.  She also has recurrent pulmonary edema and pleural effusions, thought to be related to her effusive constrictive pericarditis.  She currently feels okay and prior to getting sick in mid January was living independently at home with her husband.  Creatinine:  Lab Results  Component Value Date   CREATININE 0.68 06/26/2024   CREATININE 0.68 06/25/2024   CREATININE 0.64 06/24/2024     Past Medical History:  Diagnosis Date   Anxiety    takes Lorazepam  daily as needed   Arthritis    Cancer (HCC)    thyroid    Cataracts, bilateral    immature   Chronic back pain    compressed vertebrae,takes Fosamax weekly   Family history of adverse reaction to anesthesia     granddaughter gets very sick and mean   History of blood transfusion    no abnormal reaction noted   History of bronchitis 05/2015   History of colon polyps    benign   Hyperlipidemia    takes Zetia  daily   Hypertension    takes Diltiazem  and Avapro  daily   Hypothyroidism    takes Synthroid  daily   Ischemic colitis    hx of-many yrs ago   Joint pain    Joint swelling    OSA on CPAP    05-08-12 AHi was 46, RDI  53. titrated to   9 cm water   with 3 cm flex.-nadir 75%   PE (pulmonary embolism)    takes Coumadin  daily   Peripheral edema    Pneumonia 1992   hx of   Sarcoidosis    Shortness of breath    Urinary frequency    Weakness    left leg.Numbness and tingling.Has sciatica   Wheeze    occaionally. Not new    Past Surgical History:  Procedure Laterality Date   ABDOMINAL HYSTERECTOMY     APPENDECTOMY     BREAST BIOPSY     biopsies on right x 2   BREAST CYST ASPIRATION Left    CHOLECYSTECTOMY N/A 11/29/2015   Procedure: LAPAROSCOPIC CHOLECYSTECTOMY;  Surgeon: Linda Nephew, MD;  Location: [facility] OR;  Service: General;  Laterality: N/A;   COLONOSCOPY     DILATION AND CURETTAGE OF UTERUS     IR THORACENTESIS  RIGHT ASP PLEURAL SPACE W/IMG GUIDE  06/23/2024   LIVER BIOPSY     MELANOMA EXCISION     removed from left leg   scalene surgery  1972   trying to find out what was wrong with her lungs   THYROID  SURGERY      Social History:  Tobacco Use History[1]  Social History   Substance and Sexual Activity  Alcohol  Use No     Allergies[2]   Current Facility-Administered Medications  Medication Dose Route Frequency Provider Last Rate Last Admin   acetaminophen  (TYLENOL ) tablet 650 mg  650 mg Oral Q6H PRN [name], Linda V, MD       Or   acetaminophen  (TYLENOL ) suppository 650 mg  650 mg Rectal Q6H PRN [name], Linda V, MD       alum & mag hydroxide-simeth (MAALOX/MYLANTA) 200-200-20 MG/5ML suspension 30 mL  30 mL Oral Q4H PRN [name], Linda V, MD   30 mL at 06/22/24 0645    artificial tears ophthalmic solution 1 drop  1 drop Both Eyes PRN Linda Lavada POUR, MD   1 drop at 06/24/24 1012   colchicine  tablet 0.6 mg  0.6 mg Oral BID Linda Darryle Ned, MD   0.6 mg at 06/26/24 9160   diltiazem  (CARDIZEM  CD) 24 hr capsule 360 mg  360 mg Oral Daily [name], Linda Thomas, MD   360 mg at 06/26/24 1218   furosemide  (LASIX ) injection 40 mg  40 mg Intravenous BID [name], Peyton, PA-C   40 mg at 06/26/24 9160   guaiFENesin  (MUCINEX ) 12 hr tablet 600 mg  600 mg Oral BID PRN [name], Linda V, MD   600 mg at 06/21/24 2201   lactose free nutrition (BOOST PLUS) liquid 237 mL  237 mL Oral BID WC [name], Linda K, MD   237 mL at 06/26/24 0839   levothyroxine  (SYNTHROID ) tablet 112 mcg  112 mcg Oral q morning Linda Mario GAILS, MD   112 mcg at 06/26/24 9390   LORazepam  (ATIVAN ) tablet 0.5 mg  0.5 mg Oral Q12H PRN [name], Linda K, MD   0.5 mg at 06/25/24 2128   ondansetron  (ZOFRAN ) tablet 4 mg  4 mg Oral Q6H PRN Linda Mario GAILS, MD       Or   ondansetron  (ZOFRAN ) injection 4 mg  4 mg Intravenous Q6H PRN Linda Mario GAILS, MD       pantoprazole  (PROTONIX ) EC tablet 40 mg  40 mg Oral Daily [name], [name], [name]   40 mg at 06/26/24 0839   polyethylene glycol (MIRALAX  / GLYCOLAX ) packet 17 g  17 g Oral Daily PRN [name], Linda V, MD   17 g at 06/26/24 0840   potassium chloride  SA (KLOR-CON  M) CR tablet 40 mEq  40 mEq Oral BID Linda Darryle Ned, MD   40 mEq at 06/26/24 0839   sodium chloride  flush (NS) 0.9 % injection 3 mL  3 mL Intravenous Q12H Linda Mario V, MD   3 mL at 06/25/24 2200   spironolactone  (ALDACTONE ) tablet 12.5 mg  12.5 mg Oral Daily Linda Darryle Ned, MD   12.5 mg at 06/26/24 9160    Medications Prior to Admission  Medication Sig Dispense Refill Last Dose/Taking   acetaminophen  (TYLENOL ) 325 MG tablet Take 325-650 mg by mouth 2 (two) times daily as needed for mild pain or headache.   Past Week   bisacodyl (DULCOLAX) 10 MG suppository Place 10 mg rectally daily as needed  for moderate constipation.   Unknown  Carboxymethylcellulose Sodium (REFRESH LIQUIGEL OP) Place 1 drop into both eyes 2 (two) times daily as needed (Dry eye).   Unknown   carvedilol  (COREG ) 3.125 MG tablet Take 1 tablet (3.125 mg total) by mouth 2 (two) times daily with a meal. 60 tablet 0 06/21/2024 Morning   cyanocobalamin (VITAMIN B12) 1000 MCG tablet Take 1,000 mcg by mouth daily.   06/21/2024   diltiazem  (CARDIZEM ) 120 MG tablet Take 1 tablet (120 mg total) by mouth every 8 (eight) hours. 90 tablet 0 06/21/2024 Morning   furosemide  (LASIX ) 40 MG tablet Take 40 mg by mouth daily.   06/21/2024 Morning   levothyroxine  (SYNTHROID ) 112 MCG tablet Take 112 mcg by mouth every morning.   06/21/2024 Morning   LORazepam  (ATIVAN ) 1 MG tablet Take 1.5 tablets (1.5 mg total) by mouth See admin instructions. Take 1.5 mg by mouth at bedtime and an additional 1-1.5mg  once a day as needed for anxiety (Patient taking differently: Take 1-1.5 mg by mouth See admin instructions. Take 1.5 mg by mouth at bedtime.  PRN order: take 1 mg by mouth every 24 hours as needed for anxiety.) 10 tablet 0 06/21/2024   magnesium  hydroxide (MILK OF MAGNESIA) 400 MG/5ML suspension Take 30 mLs by mouth daily as needed for mild constipation or moderate constipation.   Unknown   melatonin 5 MG TABS Take 1 tablet (5 mg total) by mouth at bedtime as needed (if lorazepam  doesn't work). (Patient taking differently: Take 5 mg by mouth at bedtime as needed (insomnia).)   Unknown   polyethylene glycol (MIRALAX  / GLYCOLAX ) 17 g packet Take 17 g by mouth daily as needed for mild constipation. 14 each 0 Unknown   senna-docusate (SENOKOT-S) 8.6-50 MG tablet Take 1-2 tablets by mouth 2 (two) times daily between meals as needed for mild constipation or moderate constipation. (Patient taking differently: Take 2 tablets by mouth 2 (two) times daily as needed for mild constipation or moderate constipation.)   Past Week   SIMPLY SALINE NA Place 1 spray into  both nostrils every 12 (twelve) hours as needed (for dryness).   Past Week   sodium chloride  1 g tablet Take 1 tablet (1 g total) by mouth 3 (three) times daily with meals. 30 tablet 0 06/21/2024 Morning   Sodium Phosphates (ENEMA RE) Place 1 Dose rectally daily as needed (constipation).   Unknown   warfarin (COUMADIN ) 5 MG tablet Take 5 mg by mouth at bedtime.  2 06/20/2024 at 10:00 PM   diltiazem  (CARDIZEM  CD) 360 MG 24 hr capsule Take 360 mg by mouth at bedtime. (Patient not taking: Reported on 06/21/2024)   Not Taking   irbesartan  (AVAPRO ) 75 MG tablet Take 75 mg by mouth daily. (Patient not taking: Reported on 06/21/2024)   Not Taking   nebivolol  (BYSTOLIC ) 5 MG tablet Take 5 mg by mouth daily. (Patient not taking: Reported on 06/21/2024)   Not Taking   nystatin  cream (MYCOSTATIN ) Apply 1 Application topically 2 (two) times daily as needed for dry skin. (Patient not taking: Reported on 06/21/2024)   Not Taking   triamcinolone cream (KENALOG) 0.1 % Apply 1 Application topically 2 (two) times daily as needed (Irritation). (Patient not taking: Reported on 06/21/2024)   Not Taking    Family History  Problem Relation Age of Onset   Breast cancer Mother    CVA Mother    Breast cancer Sister    Breast cancer Sister    Heart attack Neg Hx    Sleep apnea Neg  Hx    Physical Exam: BP (!) 119/53 (BP Location: Right Arm)   Pulse 72   Temp 98 F (36.7 C) (Oral)   Resp 15   Ht 5' 4 (1.626 m)   Wt 102.1 kg   SpO2 93%   BMI 38.62 kg/m  Physical Exam  General - Laying in bed in no distress, appears ill CV - Afib in 70s Resp - Unlabored on nasal cannula Abd - Soft, ND/NT, obese Ext - Lower legs are wrapped due to dermatitis and open wounds  Diagnostic Studies & Laboratory data: Cardiac Studies & Procedures   ______________________________________________________________________________________________     ECHOCARDIOGRAM  ECHOCARDIOGRAM LIMITED 06/26/2024  Narrative ECHOCARDIOGRAM LIMITED  REPORT    Patient Name:   [name] Date of Exam: 06/26/2024 Medical Rec #:  [id number]            Height:       64.0 in Accession #:    [id number]           Weight:       225.0 lb Date of Birth:  05-17-1941            BSA:          2.057 m Patient Age:    83 years             BP:           119/53 mmHg Patient Gender: F                    HR:           75 bpm. Exam Location:  Inpatient  Procedure: Limited Echo, Cardiac Doppler and Limited Color Doppler (Both Spectral and Color Flow Doppler were utilized during procedure).  Indications:     Pericardial Effusion I31.3  History:         Patient has prior history of Echocardiogram examinations, most recent 06/21/2024. Pericardial Effusion; Risk Factors:Sleep Apnea and Hypertension.  Sonographer:     Koleen Popper RDCS Referring Phys:  DARRYLE Mclean [name] Diagnosing Phys: Darryle Decent MD   Sonographer Comments: Image acquisition challenging due to patient body habitus. IMPRESSIONS   1. Small pericardial effusion posterior to the LV. Poorly interrogated on this study. IVC dilated without collapse. Respiraophasic septal shift noted, indicative of increased ventricular interdependence. a small pericardial effusion is present. The pericardial effusion is posterior to the left ventricle. There is no evidence of cardiac tamponade. 2. Left ventricular ejection fraction, by estimation, is 55 to 60%. The left ventricle has normal function. Left ventricular endocardial border not optimally defined to evaluate regional wall motion. Left ventricular diastolic function could not be evaluated. 3. Right ventricular systolic function is mildly reduced. The right ventricular size is moderately enlarged. 4. Left atrial size was severely dilated. 5. Right atrial size was severely dilated. 6. The mitral valve is grossly normal. Trivial mitral valve regurgitation. No evidence of mitral stenosis. 7. The inferior vena cava is dilated in size  with <50% respiratory variability, suggesting right atrial pressure of 15 mmHg.  Comparison(s): No significant change from prior study.  FINDINGS Left Ventricle: Left ventricular ejection fraction, by estimation, is 55 to 60%. The left ventricle has normal function. Left ventricular endocardial border not optimally defined to evaluate regional wall motion. The left ventricular internal cavity size was normal in size. There is no left ventricular hypertrophy. Left ventricular diastolic function could not be evaluated. Left ventricular diastolic function could not be evaluated due to atrial  fibrillation.  Right Ventricle: The right ventricular size is moderately enlarged. No increase in right ventricular wall thickness. Right ventricular systolic function is mildly reduced.  Left Atrium: Left atrial size was severely dilated.  Right Atrium: Right atrial size was severely dilated.  Pericardium: Small pericardial effusion posterior to the LV. Poorly interrogated on this study. IVC dilated without collapse. Respiraophasic septal shift noted, indicative of increased ventricular interdependence. A small pericardial effusion is present. The pericardial effusion is posterior to the left ventricle. There is no evidence of cardiac tamponade.  Mitral Valve: The mitral valve is grossly normal. Trivial mitral valve regurgitation. No evidence of mitral valve stenosis.  Aorta: The aortic root and ascending aorta are structurally normal, with no evidence of dilitation.  Venous: The inferior vena cava is dilated in size with less than 50% respiratory variability, suggesting right atrial pressure of 15 mmHg.  IAS/Shunts: The atrial septum is grossly normal.  Additional Comments: Spectral Doppler performed. Color Doppler performed.  LEFT VENTRICLE PLAX 2D LVIDd:         4.70 cm LVIDs:         3.50 cm LV PW:         1.00 cm LV IVS:        1.20 cm LVOT diam:     2.00 cm LV SV:         71 LV SV Index:    34 LVOT Area:     3.14 cm   RIGHT VENTRICLE             IVC RV S prime:     11.90 cm/s  IVC diam: 2.50 cm TAPSE (M-mode): 1.3 cm  LEFT ATRIUM           Index LA diam:      4.40 cm 2.14 cm/m LA Vol (A2C): 72.0 ml 35.01 ml/m AORTIC VALVE LVOT Vmax:   133.00 cm/s LVOT Vmean:  86.800 cm/s LVOT VTI:    0.225 m  AORTA Ao Root diam: 2.80 cm Ao Asc diam:  3.40 cm  MITRAL VALVE MV Area (PHT): 5.66 cm     SHUNTS MV Decel Time: 134 msec     Systemic VTI:  0.22 m MR Peak grad: 62.4 mmHg     Systemic Diam: 2.00 cm MR Vmax:      395.00 cm/s MV E velocity: 138.00 cm/s MV A velocity: 36.90 cm/s MV E/A ratio:  3.74  Darryle Decent MD Electronically signed by Darryle Decent MD Signature Date/Time: 06/26/2024/3:15:19 PM    Final (Updated)    MONITORS  LONG TERM MONITOR (3-14 DAYS) 11/25/2018  Narrative  Atrial fibrillation, 100% of the time.  Average HR 86 bpm.  SHe is on anticoagulation for prior DVT.  HR ranges from 44- 146.  Continue current therapy.     CARDIAC MRI  MR CARDIAC MORPHOLOGY W WO CONTRAST 06/22/2024  Narrative CLINICAL DATA:  Clinical question of constrictive pericarditis. Study assumes HCT of 33 and BSA of 2.15 m2.  EXAM: CARDIAC MRI  TECHNIQUE: The patient was scanned on a 1.5 Tesla GE magnet. A dedicated cardiac coil was used. Functional imaging was done using Fiesta sequences. 2,3, and 4 chamber views were done to assess for RWMA's. Modified Simpson's rule using was used to calculate an ejection fraction on a dedicated work [employer]. The patient received 10 cc of Gadavist . After 10 minutes inversion recovery sequences were used to assess for infiltration and scar tissue. Flow quantification was performed 2 times during this  examination with flow quantification performed at the levels of the ascending aorta above the valve, pulmonary artery above the valve.  CONTRAST:  10 cc  of Gadavist   FINDINGS: 1. Normal left  ventricular size, with LVEDD 54 mm, and LVEDVi 55 mL/m2.  Normal left ventricular thickness, with intraventricular septal thickness of 6 mm, posterior wall thickness of 5 mm, and myocardial mass index of 49 g/m2.  Normal left ventricular systolic function (LVEF =61%). There are no regional wall motion abnormalities.  Left ventricular parametric mapping (1.5 T, MOLLI) notable for mild T2 increase (57 ms) and increase in ECV 38%.  There is late gadolinium enhancement in the left ventricular myocardium- inferior insertion point LGE.  Normal resting perfusion.  2. Normal right ventricular size with RVEDVI 69 mL/m2.  Normal right ventricular thickness.  Decreased right ventricular systolic function (RVEF =38%). There are no regional wall motion abnormalities or aneurysms, septal hypokinesis. Preserved basal excursion.  3. Bi-atrial dilation. IVC dilation 25 mm. No IVC collapse see on exam. QP/Qs is 1.13 but with no visualized shunting.  4. Normal size of the aortic root, ascending aorta. Moderate dilation of the main pulmonary artery, 31 mm.  5. Valve assessment:  Aortic Valve: Tri-leaflet, no significant stenosis or regurgitation. Mean gradient 2 mm Hg. Aortic regurgitant fraction <1 %.  Pulmonic Valve:Normal morphology. No significant stenosis or regurgitation. Regurgitant fraction < 1%  Tricuspid Valve:Tri-leaflet, qualitatively mild tricuspid regurgitation.  Mitral Valve: Normal morphology. No significant stenosis or prolapse. Mitral regurgitant volume 3 mL, regurgitant fraction 4%  6. Normal pericardial thickness. No pericardial hyper-enhancement. Normal slippage on tagging sequences. There is a D shaped septum seen in inspiration that is not seen during expiratory. A septal bounce is seen on strain imaging. Moderate pericardial effusion. Circumferential with anterior predominance.  7. There are bilateral pleural effusions with secondary atelectasis. There are  hilar and mediastinal lymph nodes greater than 1 cm. Recommended dedicated study if clinically indicated.  8. Free breathing artifacts noted. This decreased the sensitivity of RV quantification.  IMPRESSION: 1. There is a moderate pericardial effusion. The inferior vena cava is dilated with reduced respiratory collapse, consistent with elevated right sided filling pressures. In diastole, the left ventricle has a D shaped configuration with accentuated respiratory variation in septal position (septal bounce) on strain imaging and with free breathing, indicating ventricular interdependence. This is seen during inspiration and can be seen in constrictive physiology.  2. Normal left ventricular systolic function (LVEF =61%).  3. Decreased right ventricular systolic function (RVEF =38%).  4. There are bilateral pleural effusions with secondary atelectasis. Reviewed with Drs. Patwardhan and Pahwani.  Stanly Leavens MD   Electronically Signed By: Stanly Leavens M.D. On: 06/22/2024 13:50   ______________________________________________________________________________________________     I have independently reviewed the above radiologic studies and discussed with the patient   Recent Lab Findings: Lab Results  Component Value Date   WBC 6.5 06/26/2024   HGB 9.2 (L) 06/26/2024   HCT 27.7 (L) 06/26/2024   PLT 109 (L) 06/26/2024   GLUCOSE 98 06/26/2024   CHOL 242 (H) 01/12/2023   TRIG 130 01/12/2023   HDL 62 01/12/2023   LDLCALC 157 (H) 01/12/2023   ALT 19 06/23/2024   AST 29 06/23/2024   NA 137 06/26/2024   Mclean 3.3 (L) 06/26/2024   CL 91 (L) 06/26/2024   CREATININE 0.68 06/26/2024   BUN 16 06/26/2024   CO2 37 (H) 06/26/2024   TSH 9.530 (H) 06/11/2024   INR 2.2 (H) 06/26/2024  HGBA1C 7.6 (H) 06/10/2024      Assessment / Plan:   [name] is an 84 year old woman who presents with pericarditis and a pericardial effusion.  MRI on 06/22/2024 suggested  a moderate pericardial effusion with elevated right-sided filling pressures suggestive of constrictive physiology.  CT scan also demonstrated a pericardial effusion mostly in the posterior and around the apex.    She has been undergoing diuresis and repeat TTE today demonstrates a smaller effusion.  She is relatively ill and debilitated, and seems to be responding to treatment so would hold off on any intervention at this time.  Continue diuresis and treatment for pericarditis per cardiology.  No surgical intervention at this time.  If there is concern that the effusion is worsening, would recommend repeat CT scan.  Con RAMAN Jernie Schutt 06/26/2024 3:23 PM          [1]  Social History Tobacco Use  Smoking Status Never  Smokeless Tobacco Never  [2]  Allergies Allergen Reactions   Atenolol Other (See Comments)    Depression    Citalopram Hydrobromide Nausea Only and Other (See Comments)    Nausea/Fatigue   Clindamycin /Lincomycin Other (See Comments)    Via IV, has caused a metallic taste in the mouth    Codeine Other (See Comments)    Caused Hyperactivity and Dysphoria   Coenzyme Q10 Itching   Erythromycin Itching, Anxiety and Other (See Comments)    Also with jitters   Motrin  [Ibuprofen ] Other (See Comments)    Is to not take this   Statins Other (See Comments)    Leg muscle weakness w/ rosuvastatin and simvastatin    Latex Rash   Losartan Potassium Other (See Comments)    Muscle aches    "

## 2024-06-26 NOTE — Progress Notes (Signed)
 PHARMACY - ANTICOAGULATION CONSULT NOTE  Pharmacy Consult for heparin  when INR <2 Indication: atrial fibrillation, hx PE, warfarin on hold  Allergies[1]  Patient Measurements: Height: 5' 4 (162.6 cm) Weight: 102.1 kg (225 lb) IBW/kg (Calculated) : 54.7 HEPARIN  DW (KG): 78.5  Vital Signs: Temp: 97.7 F (36.5 C) (01/18 0700) Temp Source: Oral (01/18 0700) BP: 119/53 (01/18 0700) Pulse Rate: 69 (01/17 2243)  Labs: Recent Labs    06/24/24 0411 06/25/24 0436 06/26/24 0545  HGB 9.7* 9.2* 9.2*  HCT 30.2* 28.6* 27.7*  PLT 103* 103* 109*  LABPROT 35.0* 31.1* 25.9*  INR 3.3* 2.8* 2.2*  CREATININE 0.64 0.68 0.68    Estimated Creatinine Clearance: 59.8 mL/min (by C-G formula based on SCr of 0.68 mg/dL).   Medical History: Past Medical History:  Diagnosis Date   Anxiety    takes Lorazepam  daily as needed   Arthritis    Cancer (HCC)    thyroid    Cataracts, bilateral    immature   Chronic back pain    compressed vertebrae,takes Fosamax weekly   Family history of adverse reaction to anesthesia    granddaughter gets very sick and mean   History of blood transfusion    no abnormal reaction noted   History of bronchitis 05/2015   History of colon polyps    benign   Hyperlipidemia    takes Zetia  daily   Hypertension    takes Diltiazem  and Avapro  daily   Hypothyroidism    takes Synthroid  daily   Ischemic colitis    hx of-many yrs ago   Joint pain    Joint swelling    OSA on CPAP    05-08-12 AHi was 46, RDI  53. titrated to   9 cm water   with 3 cm flex.-nadir 75%   PE (pulmonary embolism)    takes Coumadin  daily   Peripheral edema    Pneumonia 1992   hx of   Sarcoidosis    Shortness of breath    Urinary frequency    Weakness    left leg.Numbness and tingling.Has sciatica   Wheeze    occaionally. Not new    Medications:  Medications Prior to Admission  Medication Sig Dispense Refill Last Dose/Taking   acetaminophen  (TYLENOL ) 325 MG tablet Take 325-650 mg  by mouth 2 (two) times daily as needed for mild pain or headache.   Past Week   bisacodyl (DULCOLAX) 10 MG suppository Place 10 mg rectally daily as needed for moderate constipation.   Unknown   Carboxymethylcellulose Sodium (REFRESH LIQUIGEL OP) Place 1 drop into both eyes 2 (two) times daily as needed (Dry eye).   Unknown   carvedilol  (COREG ) 3.125 MG tablet Take 1 tablet (3.125 mg total) by mouth 2 (two) times daily with a meal. 60 tablet 0 06/21/2024 Morning   cyanocobalamin (VITAMIN B12) 1000 MCG tablet Take 1,000 mcg by mouth daily.   06/21/2024   diltiazem  (CARDIZEM ) 120 MG tablet Take 1 tablet (120 mg total) by mouth every 8 (eight) hours. 90 tablet 0 06/21/2024 Morning   furosemide  (LASIX ) 40 MG tablet Take 40 mg by mouth daily.   06/21/2024 Morning   levothyroxine  (SYNTHROID ) 112 MCG tablet Take 112 mcg by mouth every morning.   06/21/2024 Morning   LORazepam  (ATIVAN ) 1 MG tablet Take 1.5 tablets (1.5 mg total) by mouth See admin instructions. Take 1.5 mg by mouth at bedtime and an additional 1-1.5mg  once a day as needed for anxiety (Patient taking differently: Take 1-1.5 mg by mouth  See admin instructions. Take 1.5 mg by mouth at bedtime.  PRN order: take 1 mg by mouth every 24 hours as needed for anxiety.) 10 tablet 0 06/21/2024   magnesium  hydroxide (MILK OF MAGNESIA) 400 MG/5ML suspension Take 30 mLs by mouth daily as needed for mild constipation or moderate constipation.   Unknown   melatonin 5 MG TABS Take 1 tablet (5 mg total) by mouth at bedtime as needed (if lorazepam  doesn't work). (Patient taking differently: Take 5 mg by mouth at bedtime as needed (insomnia).)   Unknown   polyethylene glycol (MIRALAX  / GLYCOLAX ) 17 g packet Take 17 g by mouth daily as needed for mild constipation. 14 each 0 Unknown   senna-docusate (SENOKOT-S) 8.6-50 MG tablet Take 1-2 tablets by mouth 2 (two) times daily between meals as needed for mild constipation or moderate constipation. (Patient taking differently:  Take 2 tablets by mouth 2 (two) times daily as needed for mild constipation or moderate constipation.)   Past Week   SIMPLY SALINE NA Place 1 spray into both nostrils every 12 (twelve) hours as needed (for dryness).   Past Week   sodium chloride  1 g tablet Take 1 tablet (1 g total) by mouth 3 (three) times daily with meals. 30 tablet 0 06/21/2024 Morning   Sodium Phosphates (ENEMA RE) Place 1 Dose rectally daily as needed (constipation).   Unknown   warfarin (COUMADIN ) 5 MG tablet Take 5 mg by mouth at bedtime.  2 06/20/2024 at 10:00 PM   diltiazem  (CARDIZEM  CD) 360 MG 24 hr capsule Take 360 mg by mouth at bedtime. (Patient not taking: Reported on 06/21/2024)   Not Taking   irbesartan  (AVAPRO ) 75 MG tablet Take 75 mg by mouth daily. (Patient not taking: Reported on 06/21/2024)   Not Taking   nebivolol  (BYSTOLIC ) 5 MG tablet Take 5 mg by mouth daily. (Patient not taking: Reported on 06/21/2024)   Not Taking   nystatin  cream (MYCOSTATIN ) Apply 1 Application topically 2 (two) times daily as needed for dry skin. (Patient not taking: Reported on 06/21/2024)   Not Taking   triamcinolone cream (KENALOG) 0.1 % Apply 1 Application topically 2 (two) times daily as needed (Irritation). (Patient not taking: Reported on 06/21/2024)   Not Taking    Assessment: [name] is a 84 y.o. year old female presented on 06/21/2024 with concern for shob found to have aspiration pneumonia, pericardial effusion, and pleural effusion. On warfarin prior to admission for afib and hx of PE. Prior to admission regimen is warfarin 5mg  PO daily. Last dose prior to admission 1/12 2200. Pharmacy initially consulted for warfarin inpatient management.   Date INR Warfarin Dose  1/12 2.2, therapeutic  5mg  (pta)   1/13 2.6, therapeutic  5mg    1/14 3.5, supra-therapeutic    1/15 3.9, supra-therapeutic   1/16 3.3, supra-therapeutic   1/17 2.8, supra-therapeutic    Patient s/p thoracentesis 1/15 with fluid removed.  Pharmacy  consulted to start heparin  when INR <2 in the event repeat thoracentesis or pericardial effusion drainage needed.  Updated plan: Per cardiology on 06/25/24, will plan to hold anticoagulation as pt may need treatment for effusive constrictive pericarditis  INR 2.2 Hgb 9.2, PLT 109  Per RN, no issues with infusion and some epistaxis likely due to oxygen .    Goal of Therapy:  INR 2-3 Monitor platelets by anticoagulation protocol: Yes   Plan:  No heparin  today  Daily PT/INR and CBC FU cardiology for heparin  restart Assess for signs/sx bleeding  and drug-drug interactions  Thank you for allowing pharmacy to participate in this patient's care.  Prentice DOROTHA Favors, PharmD PGY1 Health-System Pharmacy Administration and Leadership Resident [name]  06/26/2024 8:05 AM               [1]  Allergies Allergen Reactions   Atenolol Other (See Comments)    Depression    Citalopram Hydrobromide Nausea Only and Other (See Comments)    Nausea/Fatigue   Clindamycin /Lincomycin Other (See Comments)    Via IV, has caused a metallic taste in the mouth    Codeine Other (See Comments)    Caused Hyperactivity and Dysphoria   Coenzyme Q10 Itching   Erythromycin Itching, Anxiety and Other (See Comments)    Also with jitters   Motrin  [Ibuprofen ] Other (See Comments)    Is to not take this   Statins Other (See Comments)    Leg muscle weakness w/ rosuvastatin and simvastatin    Latex Rash   Losartan Potassium Other (See Comments)    Muscle aches

## 2024-06-26 NOTE — Plan of Care (Signed)
  Problem: Education: Goal: Ability to describe self-care measures that may prevent or decrease complications (Diabetes Survival Skills Education) will improve Outcome: Progressing   Problem: Coping: Goal: Ability to adjust to condition or change in health will improve Outcome: Progressing   Problem: Health Behavior/Discharge Planning: Goal: Ability to identify and utilize available resources and services will improve Outcome: Progressing Goal: Ability to manage health-related needs will improve Outcome: Progressing   Problem: Metabolic: Goal: Ability to maintain appropriate glucose levels will improve Outcome: Progressing

## 2024-06-26 NOTE — Progress Notes (Signed)
 " Cardiology Progress Note  Patient ID: [name] MRN: [id number] DOB: October 03, 1940 Date of Encounter: 06/26/2024 Primary Cardiologist: Georganna Archer, MD  Subjective   Chief Complaint: SOB  HPI: Left thoracentesis yesterday.  Feeling better.  Still volume up.  ROS:  All other ROS reviewed and negative. Pertinent positives noted in the HPI.     Telemetry  Overnight telemetry shows A-fib heart rate 70s, which I personally reviewed.    Physical Exam   Vitals:   06/25/24 1619 06/25/24 2017 06/25/24 2243 06/26/24 0700  BP: 106/62 124/66 (!) 128/47 (!) 119/53  Pulse:   69   Resp:   20 (!) 2  Temp: (!) 97.3 F (36.3 C) 97.7 F (36.5 C) (!) 97.5 F (36.4 C) 97.7 F (36.5 C)  TempSrc: Oral Oral Oral Oral  SpO2: (!) 2%  99%   Weight:      Height:        Intake/Output Summary (Last 24 hours) at 06/26/2024 1028 Last data filed at 06/26/2024 0413 Gross per 24 hour  Intake 3 ml  Output 1350 ml  Net -1347 ml       06/21/2024    9:25 PM 06/20/2024    4:45 AM 06/19/2024    5:00 AM  Last 3 Weights  Weight (lbs) 225 lb 239 lb 10.2 oz 240 lb 8.4 oz  Weight (kg) 102.059 kg 108.7 kg 109.1 kg    Body mass index is 38.62 kg/m.  General: Ill-appearing Head: Atraumatic, normal size  Eyes: PEERLA, EOMI  Neck: Supple, no JVD Endocrine: No thryomegaly Cardiac: Irregular rhythm, no murmurs Lungs: Diminished breath sounds bilaterally Abd: Soft, nontender, no hepatomegaly  Ext: 1+ pitting edema Musculoskeletal: No deformities, BUE and BLE strength normal and equal Skin: Warm and dry, no rashes   Neuro: Alert and oriented to person, place, time, and situation, CNII-XII grossly intact, no focal deficits  Psych: Normal mood and affect   Cardiac Studies  TTE 06/21/2024   1. Moderate pericardial effusion which is more prominent up to 2.0 cm  posterior to the LV (best seen subcostal; image 49). The RV is poorly  visualized, but does not appear to collapse in diastole. The IVC is   dilated. There is evidence of respiratory  variation in septal movement on image 41. Findings are suggestive of a  clinically significant pericardial effusion. Clinical correlation is  recommended. Appearance is stable compared to prior study. Moderate  pericardial effusion. The pericardial effusion   is circumferential.   2. Right ventricular systolic function was not well visualized. The right  ventricular size is not well visualized. Tricuspid regurgitation signal is  inadequate for assessing PA pressure.   3. The mitral valve is grossly normal. Trivial mitral valve  regurgitation. No evidence of mitral stenosis.   4. The aortic valve is tricuspid. Aortic valve regurgitation is not  visualized. Aortic valve sclerosis is present, with no evidence of aortic  valve stenosis.   5. The inferior vena cava is dilated in size with <50% respiratory  variability, suggesting right atrial pressure of 15 mmHg.   6. Left ventricular ejection fraction, by estimation, is 60 to 65%. The  left ventricle has normal function. Left ventricular endocardial border  not optimally defined to evaluate regional wall motion.   Patient Profile  [name] is a 84 y.o. female with hypertension, permanent atrial fibrillation, obesity, hyperlipidemia, OSA who was admitted on 06/21/2024 for acute hypoxic respiratory failure and volume overload secondary to effusive constrictive pericarditis.  Assessment &  Plan   # Effusive constrictive pericarditis # Moderate pericardial effusion # Volume overload # Recurrent pleural effusions - Reviewed her CT scan.  No significant calcifications of the pericardium.  Has had this pericardial effusion and now has constrictive physiology on cardiac MRI and echo.  I believe she has acute pericarditis on my review of her MRI.  She does have LGE in the pericardium.   - ESR and CRP are significantly elevated as well. - I believe she needs this effusion drained.  On her echo  from 06/21/2024 this is predominantly posterior.  We will repeat her echo.  I think if we cannot drain this percutaneously she will need a pericardial window.  I do not believe she needs pericardiectomy.  Her pericardium is not calcified to suggest this process is end-stage.  I have started her on colchicine  0.6 mg twice daily.  She will need to be on anti-inflammatory treatment once her INR has reached an appropriate level. - I am going to go ahead and give her 5 mg of vitamin K .  She needs to be off anticoagulation anyway in the setting of acute pericarditis.  This would be high risk for hemorrhage.  She will need pericardiocentesis or pericardial window.  Since she will be off anticoagulation I will go ahead and give her vitamin K  today. - Once her INR is less than 2 we can start her on high-dose ibuprofen .  Will hold right now given her INR level.  She will need her CRP trended as well. - In the meantime continue Lasix  40 mg IV twice daily.  Continue Aldactone  12.5 mg daily.  Add potassium 40 mill equivalents twice daily.  # Permanent atrial fibrillation - Rate controlled on diltiazem  120 mg every 8 hours.  Transition to extended release tablet 360 mg daily. - I am going to reverse her INR.  She is going to need this pericardial fusion drain.  She will also need to remain off anticoagulation while being treated for acute pericarditis.  # Pleural Effusion  # Pulmonary edema - She continues to struggle with pulmonary edema.  Related to effusive constrictive pericarditis.  She is status post multiple left-sided thoracenteses.  She really needs this fluid drained and aggressive treatment of pericarditis.  We are working towards this.     For questions or updates, please contact [facility] Please consult [email or web address] for contact info under        Signed, Darryle T. Barbaraann, MD, [facility] [place]  [facility]  06/26/2024 10:28 AM   "

## 2024-06-26 NOTE — Progress Notes (Signed)
" °  Echocardiogram 2D Echocardiogram has been performed.  [name], RDCS 06/26/2024, 3:05 PM "

## 2024-06-26 NOTE — Progress Notes (Signed)
 "                                                                                                                                                                                                                                                                                PROGRESS NOTE     Patient Demographics:    [name], is a 84 y.o. female, DOB - [birth year], FMW:[id number]  Outpatient Primary MD for the patient is [name], MD    LOS - 5  Admit date - 06/21/2024    Chief Complaint  Patient presents with   Shortness of Breath       Brief Narrative (HPI from H&P)   84 y.o. female with past medical history  of   thyroid  cancer, hyperlipidemia, hypertension, hypothyroid, anxiety, arthritis, OSA on CPAP, and history of sarcoidosis Most recently she was admitted from June 10, 2024 with shock, Staphylococcus epidermidis bacteremia, acute kidney injury, hyperkalemia, and moderate pericardial effusion and discharged June 20, 2024, presented back to ED with shortness of breath.   During recent hospitalization, She was initially admitted to the ICU ,received fresh frozen plasma, vitamin K , lokelma , bicarbonate infusion, and empiric antibiotics. Echocardiography revealed an ejection fraction of 45-50% with a large pericardial effusion without tamponade. Pt had S. epidermidis bacteremia / completed 7 days of IV linezolid .  Patient was seen by cardiology on January 7 for her pericardial effusion with no tamponade on echo with plan for recheck echocardiogram.  Patient was discharged on 5 L of oxygen .  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  Diagnosed with acute hypoxic respiratory failure.  Transferred to my care on 06/23/2024.    Subjective:   Patient in bed, appears comfortable,  denies any headache, no fever, no chest pain or pressure, no shortness of breath , no abdominal pain. No focal weakness.   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  Patient currently on 10 L  nasal cannula oxygen , has been seen by cardiology and pulmonary, left-sided pleural effusion has progressed considerably hence IR requested to do ultrasound-guided therapeutic and diagnostic thoracentesis on 06/23/2024, continue supportive care, CTA was nonacute without any PE.  There is a question of aspiration pneumonia for which she finished antibiotics and cleared by speech.  Continue diuresis as tolerated with IV diuretics, she was much improved on 06/24/2024 after left-sided thoracentesis on 06/23/2024 with 900 cc of fluid removed, fluid appears transudative diuresis is being continued, unfortunately on 06/25/2024 pleural effusion reoccurred left more than right, she underwent repeat ultrasound-guided thoracentesis by pulmonary on 06/25/2024 with improvement clinically.  Cardiology now contemplating pericardiocentesis but most likely in the OR by cardiothoracic surgery due to the posterior location of the pericardial effusion.  Await INR to be within comfortable range for the procedure to be done.  Gently trending down.  Per cardiology no full anticoagulation for several months.   Permanent atrial fibrillation/history of PE: Rate controlled.  Continue Coreg  and Cardizem .  On Coumadin , INR slightly supratherapeutic.  Pharmacy managing Coumadin  dose/heparin  once INR is subtherapeutic in case cardiology wants to do a pericardiocentesis.  Lab Results  Component Value Date   INR 2.2 (H) 06/26/2024   INR 2.8 (H) 06/25/2024   INR 3.3 (H) 06/24/2024   PROTIME 25.2 (H) 11/29/2014   Hypertension: Blood pressure controlled.  Continue Cardizem , holding home dose of  Avapro .   GERD:   on PPI.   Hypokalemia, hypophosphatemia.  Replaced.    Acquired hypothyroidism: Continue Synthroid .   OSA: Nightly CPAP ordered.   Anemia of chronic disease: Stable.   Obesity.  BMI 38.  Follow-up with PCP.  Type 2 diabetes mellitus:   Not on any medications PTA.  Currently on SSI.  Lab Results  Component Value Date   HGBA1C 7.6 (H) 06/10/2024   CBG (last 3)  Recent Labs    06/25/24 2354 06/26/24 0343 06/26/24 0726  GLUCAP 87 91 107*        Condition - Extremely Guarded  Family Communication  : Husband [name] [phone]  called on 06/23/2024 at 8:07 AM  Code Status : Full code  Consults  : IR, cardiology, pulmonary  PUD Prophylaxis :     Procedures  :     Repeat left-sided ultrasound-guided thoracentesis by pulmonary on 06/25/2024.    Left ultrasound-guided thoracentesis by IR on 06/23/2024.  900 cc fluid removed, chemistries pending  CTA.  1. No evidence of pulmonary embolism. 2. Small to moderate size right pleural effusion with associated basilar atelectasis. Small left pleural effusion with associated basilar atelectasis. 3. Subtle hazy density over the perihilar region of the left upper lobe possibly due to minimal asymmetric edema and less likely early infection. 4. Mild stable cardiomegaly with interval decrease in pericardial effusion. Atherosclerotic coronary artery disease. 5. Aortic atherosclerosis. 6. Evidence of old granulomatous disease. Aortic Atherosclerosis (ICD10-I70.0).  Cardiac MRI.  1. There is a moderate pericardial effusion. The inferior vena cava is dilated with reduced respiratory collapse, consistent with elevated right sided filling pressures. In diastole, the left ventricle has a D shaped configuration with accentuated respiratory variation in septal position (septal bounce) on strain imaging and with free breathing, indicating ventricular interdependence. This is seen during inspiration and can be seen in constrictive  physiology. 2. Normal left ventricular systolic function (LVEF =61%). 3. Decreased right ventricular systolic function (RVEF =38%). 4. There are bilateral pleural effusions with secondary atelectasis  Echocardiogram -  1. Moderate pericardial effusion which is more prominent  up to 2.0 cm posterior to the LV (best seen subcostal; image 49). The RV is poorly visualized, but does not appear to collapse in diastole. The IVC is dilated. There is evidence of respiratory variation in septal movement on image 41. Findings are suggestive of a clinically significant pericardial effusion. Clinical correlation is recommended. Appearance is stable compared to prior study. Moderate pericardial effusion. The pericardial effusion  is circumferential.  2. Right ventricular systolic function was not well visualized. The right ventricular size is not well visualized. Tricuspid regurgitation signal is inadequate for assessing PA pressure.  3. The mitral valve is grossly normal. Trivial mitral valve regurgitation. No evidence of mitral stenosis.  4. The aortic valve is tricuspid. Aortic valve regurgitation is not visualized. Aortic valve sclerosis is present, with no evidence of aortic valve stenosis.  5. The inferior vena cava is dilated in size with <50% respiratory variability, suggesting right atrial pressure of 15 mmHg.  6. Left ventricular ejection fraction, by estimation, is 60 to 65%. The left ventricle has normal function. Left ventricular endocardial border not optimally defined to evaluate regional wall motion. Comparison(s): No significant change from prior study.      Disposition Plan  :    Status is: Inpatient PPI  DVT Prophylaxis  : Coumadin   Lab Results  Component Value Date   INR 2.2 (H) 06/26/2024   INR 2.8 (H) 06/25/2024   INR 3.3 (H) 06/24/2024   PROTIME 25.2 (H) 11/29/2014     Lab Results  Component Value Date   PLT 109 (L) 06/26/2024    Diet :  Diet Order             DIET DYS 3 Room  service appropriate? Yes; Fluid consistency: Thin  Diet effective now                    Inpatient Medications  Scheduled Meds:  colchicine   0.6 mg Oral BID   diltiazem   120 mg Oral Q8H   furosemide   40 mg Intravenous BID   lactose free nutrition  237 mL Oral BID WC   levothyroxine   112 mcg Oral q morning   pantoprazole   40 mg Oral Daily   potassium chloride   40 mEq Oral BID   potassium chloride   40 mEq Oral Once   sodium chloride  flush  3 mL Intravenous Q12H   spironolactone   12.5 mg Oral Daily   Continuous Infusions:   PRN Meds:.acetaminophen  **OR** acetaminophen , alum & mag hydroxide-simeth, artificial tears, guaiFENesin , LORazepam , ondansetron  **OR** ondansetron  (ZOFRAN ) IV, polyethylene glycol     Objective:   Vitals:   06/25/24 1619 06/25/24 2017 06/25/24 2243 06/26/24 0700  BP: 106/62 124/66 (!) 128/47 (!) 119/53  Pulse:   69   Resp:   20 (!) 2  Temp: (!) 97.3 F (36.3 C) 97.7 F (36.5 C) (!) 97.5 F (36.4 C) 97.7 F (36.5 C)  TempSrc: Oral Oral Oral Oral  SpO2: (!) 2%  99%   Weight:      Height:        Wt Readings from Last 3 Encounters:  06/21/24 102.1 kg  06/20/24 108.7 kg  05/27/24 109.3 kg     Intake/Output Summary (Last 24 hours) at 06/26/2024 0908 Last data filed at 06/26/2024 0413 Gross per 24 hour  Intake 3 ml  Output 1350 ml  Net -1347 ml     Physical Exam  Awake Alert, No new F.N deficits, in mild respiratory distress [place].AT,PERRAL Supple Neck, No JVD,  Symmetrical Chest wall movement, Good air movement bilaterally, reduced bibasilar breath sounds left more reduced than right RRR,No Gallops,Rubs or new Murmurs,  +ve B.Sounds, Abd Soft, No tenderness,   1+ chronic bilateral lower extremity edema     RN pressure injury documentation: Wound 06/22/24 1541 Pressure Injury Sacrum Medial Stage 2 -  Partial thickness loss of dermis presenting as a shallow open injury with a red, pink wound bed without slough. (Active)      Data  Review:    Recent Labs  Lab 06/21/24 1237 06/22/24 0404 06/23/24 0740 06/24/24 0411 06/25/24 0436 06/26/24 0545  WBC 10.9* 10.0 8.4 7.6 7.4 6.5  HGB 10.7* 10.1* 10.3* 9.7* 9.2* 9.2*  HCT 33.8* 32.6* 31.8* 30.2* 28.6* 27.7*  PLT 175 156 109* 103* 103* 109*  MCV 93.1 93.4 91.6 92.6 91.7 91.4  MCH 29.5 28.9 29.7 29.8 29.5 30.4  MCHC 31.7 31.0 32.4 32.1 32.2 33.2  RDW 16.4* 16.4* 16.5* 16.5* 16.6* 16.4*  LYMPHSABS 0.7 0.9 1.0 1.3 1.6  --   MONOABS 0.9 1.0 1.2* 1.6* 2.1*  --   EOSABS 0.1 0.1 0.1 0.1 0.1  --   BASOSABS 0.0 0.0 0.0 0.0 0.0  --     Recent Labs  Lab 06/21/24 1237 06/21/24 1501 06/21/24 2223 06/22/24 0404 06/23/24 0300 06/23/24 0740 06/24/24 0411 06/25/24 0436 06/25/24 1239 06/26/24 0545  NA 133*  --   --  137  --  139 140 141  --  137  K 4.2  --   --  3.8  --  3.4* 3.4* 3.5  --  3.3*  CL 93*  --   --  95*  --  91* 93* 93*  --  91*  CO2 32  --   --  33*  --  38* 39* 41*  --  37*  ANIONGAP 9  --   --  8  --  10 8 6   --  8  GLUCOSE 134*  --   --  106*  --  95 86 88  --  98  BUN 22  --   --  20  --  16 17 19   --  16  CREATININE 0.94  --   --  0.82  --  0.68 0.64 0.68  --  0.68  AST 26  --   --   --   --  29  --   --   --   --   ALT 16  --   --   --   --  19  --   --   --   --   ALKPHOS 154*  --   --   --   --  147*  --   --   --   --   BILITOT 0.6  --   --   --   --  0.6  --   --   --   --   ALBUMIN 2.8*  --   --   --   --  2.8*  --   --  2.7*  --   CRP  --   --   --   --   --  7.6*  --   --   --   --   PROCALCITON  --   --   --   --   --  <0.10  --   --   --   --   LATICACIDVEN  --  2.7* 1.8  --   --   --   --   --   --   --  INR 2.6*  --   --  3.5* 3.9*  --  3.3* 2.8*  --  2.2*  MG  --   --   --  1.9  --  1.7 1.9 2.1  --  1.8  PHOS  --   --   --   --   --   --  2.1* 2.6  --   --   CALCIUM  8.3*  --   --  8.3*  --  8.4* 8.3* 8.6*  --  8.8*      Recent Labs  Lab 06/21/24 1501 06/21/24 2223 06/22/24 0404 06/23/24 0300 06/23/24 0740 06/24/24 0411  06/25/24 0436 06/26/24 0545  CRP  --   --   --   --  7.6*  --   --   --   PROCALCITON  --   --   --   --  <0.10  --   --   --   LATICACIDVEN 2.7* 1.8  --   --   --   --   --   --   INR  --   --  3.5* 3.9*  --  3.3* 2.8* 2.2*  MG  --   --  1.9  --  1.7 1.9 2.1 1.8  CALCIUM   --   --  8.3*  --  8.4* 8.3* 8.6* 8.8*    --------------------------------------------------------------------------------------------------------------- Lab Results  Component Value Date   CHOL 242 (H) 01/12/2023   HDL 62 01/12/2023   LDLCALC 157 (H) 01/12/2023   TRIG 130 01/12/2023   CHOLHDL 3.9 01/12/2023    Lab Results  Component Value Date   HGBA1C 7.6 (H) 06/10/2024   No results for input(s): TSH, T4TOTAL, FREET4, T3FREE, THYROIDAB in the last 72 hours. No results for input(s): VITAMINB12, FOLATE, FERRITIN, TIBC, IRON, RETICCTPCT in the last 72 hours. ------------------------------------------------------------------------------------------------------------------ Cardiac Enzymes No results for input(s): CKMB, TROPONINI, MYOGLOBIN in the last 168 hours.  Invalid input(s): CK  Micro Results Recent Results (from the past 240 hours)  Culture, blood (Routine X 2) w Reflex to ID Panel     Status: None   Collection Time: 06/16/24  2:22 PM   Specimen: BLOOD LEFT HAND  Result Value Ref Range Status   Specimen Description   Final    BLOOD LEFT HAND Performed at [facility], [address] [place], [place] [address]    Special Requests   Final    BOTTLES DRAWN AEROBIC AND ANAEROBIC Blood Culture adequate volume Performed at [facility], [address], [place], [place] [address]    Culture   Final    NO GROWTH 5 DAYS Performed at [facility], [address], [place], [place] [address]    Report Status 06/21/2024 FINAL  Final  Culture, blood (Routine X 2) w Reflex to ID Panel     Status: None   Collection Time: 06/16/24  2:27 PM    Specimen: BLOOD LEFT HAND  Result Value Ref Range Status   Specimen Description   Final    BLOOD LEFT HAND Performed at [facility], [address], [place], [place] [address]    Special Requests   Final    BOTTLES DRAWN AEROBIC AND ANAEROBIC Blood Culture adequate volume Performed at [facility], [address], [place], [place] [address]    Culture   Final    NO GROWTH 5 DAYS Performed at [facility], [address] [place], [place]  [address]    Report Status 06/21/2024 FINAL  Final  Resp panel by RT-PCR (RSV, Flu A&B, Covid) Anterior Nasal Swab     Status: None   Collection Time: 06/21/24 12:26 PM   Specimen: Anterior Nasal Swab  Result Value Ref Range Status   SARS Coronavirus 2 by RT PCR NEGATIVE NEGATIVE Final   Influenza A by PCR NEGATIVE NEGATIVE Final   Influenza B by PCR NEGATIVE NEGATIVE Final    Comment: (NOTE) The Xpert Xpress SARS-CoV-2/FLU/RSV plus assay is intended as an aid in the diagnosis of influenza from Nasopharyngeal swab specimens and should not be used as a sole basis for treatment. Nasal washings and aspirates are unacceptable for Xpert Xpress SARS-CoV-2/FLU/RSV testing.  Fact Sheet for Patients: bloggercourse.com  Fact Sheet for Healthcare Providers: seriousbroker.it  This test is not yet approved or cleared by the [place]  FDA and has been authorized for detection and/or diagnosis of SARS-CoV-2 by FDA under an Emergency Use Authorization (EUA). This EUA will remain in effect (meaning this test can be used) for the duration of the COVID-19 declaration under Section 564(b)(1) of the Act, 21 U.S.C. section 360bbb-3(b)(1), unless the authorization is terminated or revoked.     Resp Syncytial Virus by PCR NEGATIVE NEGATIVE Final    Comment: (NOTE) Fact Sheet for Patients: bloggercourse.com  Fact Sheet for Healthcare  Providers: seriousbroker.it  This test is not yet approved or cleared by the [place]  FDA and has been authorized for detection and/or diagnosis of SARS-CoV-2 by FDA under an Emergency Use Authorization (EUA). This EUA will remain in effect (meaning this test can be used) for the duration of the COVID-19 declaration under Section 564(b)(1) of the Act, 21 U.S.C. section 360bbb-3(b)(1), unless the authorization is terminated or revoked.  Performed at [facility], [address], [place], [place] [address]   Body fluid culture w Gram Stain     Status: None (Preliminary result)   Collection Time: 06/25/24 11:45 AM   Specimen: Pleural Fluid  Result Value Ref Range Status   Specimen Description PLEURAL  Final   Special Requests PLEURAL,LEFT  Final   Gram Stain   Final    RARE WBC PRESENT, PREDOMINANTLY MONONUCLEAR NO ORGANISMS SEEN    Culture   Final    NO GROWTH < 24 HOURS Performed at [facility], [address] [place], [place] [address]    Report Status PENDING  Incomplete    Radiology Report DG CHEST PORT 1 VIEW Result Date: 06/25/2024 EXAM: 1 VIEW(S) XRAY OF THE CHEST 06/25/2024 12:02:00 PM COMPARISON: 06/25/2024 CLINICAL HISTORY: S/P thoracentesis. FINDINGS: LUNGS AND PLEURA: Decreased left pleural effusion. Improved aeration of left lung base. Interstitial opacities, likely pulmonary edema. No pneumothorax. HEART AND MEDIASTINUM: Unchanged cardiomegaly. Tortuous aorta with aortic atherosclerosis. Calcified mediastinal lymph nodes. BONES AND SOFT TISSUES: Multilevel thoracic osteophytosis. IMPRESSION: 1. Decreased left pleural effusion and improved aeration of the left lung base. No pneumothorax identified following thoracentesis. 2. Interstitial opacities, likely pulmonary edema. 3. Unchanged cardiomegaly. Electronically signed by: Waddell Calk MD 06/25/2024 12:22 PM EST RP Workstation: [id number]   DG Chest Port 1 View Result Date:  06/25/2024 EXAM: 1 VIEW XRAY OF THE CHEST 06/25/2024 06:44:00 AM COMPARISON: Portable chest and CTA chest, both dated 06/23/2024. CLINICAL HISTORY: SOB (shortness of breath) FINDINGS: LUNGS AND PLEURA: Left greater than right pleural effusions are noted. Patchy consolidation or atelectasis is present in the left greater than right lower lung fields. The remaining lungs appear clear. No pneumothorax.  Overall aeration seems unchanged. HEART AND MEDIASTINUM: The heart is enlarged. There is central vascular prominence without appreciable edema. Mediastinum is stable with aortic atherosclerosis. BONES AND SOFT TISSUES: Thoracic spondylosis with no new osseous findings. IMPRESSION: 1. Left greater than right pleural effusions with associated patchy consolidation or atelectasis in the left greater than right lower lung fields. Stable overall aeration. 2. Enlarged heart with central vascular prominence without appreciable edema. Electronically signed by: Francis Quam MD 06/25/2024 07:21 AM EST RP Workstation: [id number]   DG ESOPHAGUS W SINGLE CM (SOL OR THIN BA) Result Date: 06/24/2024 CLINICAL DATA:  84 year old female recently admitted with septic shock from staph bacteremia, readmitted 1 day after discharge with acute on chronic respiratory and heart failure, and pericardial and pleural effusions. Possible aspiration pneumonia. Patient is noted with difficulty swallowing liquids, solids, and pills. EXAM: ESOPHAGUS/BARIUM SWALLOW/TABLET STUDY TECHNIQUE: Single contrast examination was performed using thin liquid barium. This exam was performed by Carlin Griffon, PA-C, and was supervised and interpreted by Dr. Ryan Salvage, MD. FLUOROSCOPY: Radiation Exposure Index (as provided by the fluoroscopic device): 58.10 mGy Kerma COMPARISON:  None Available. FINDINGS: Swallowing: Appears normal. No vestibular penetration or aspiration seen. Pharynx: Unremarkable. Esophagus: Likely mild dilation of proximal esophagus.  Esophageal motility: Moderate to severe dysmotility with poor primary peristalsis, stasis of barium and esophagus despite table tilt, notable proximal escape with regurgitation (series 5, image 73/83), and poor emptying at LES into gastric lumen. Hiatal Hernia: None noted. Gastroesophageal reflux: None visualized despite provocative maneuvers (series 6). Ingested 13mm barium tablet: Became stuck longer than 5 minutes. Other: Study is severely limited by patient's inability to position adequately for study. Only AP imaging was possible. The entirety of the study was performed at 20 degrees tilt. IMPRESSION: Moderate to severe esophageal dysmotility, Inability of barium tablet to pass through the distal esophagus. Consider upper endoscopy to evaluate for benign or malignant stricturing. No lesion appreciated on CT 1 day prior. Electronically Signed   By: Jackquline Boxer M.D.   On: 06/24/2024 14:19     Signature  -   Lavada Stank M.D on 06/26/2024 at 9:08 AM   -  To page go to [email or web address]   "

## 2024-06-26 NOTE — Progress Notes (Signed)
 Repeat echocardiogram shows small pericardial effusion.  The effusion size seems to be decreasing.  Although it is mostly posterior on CT imaging it seems to be improving.  Discussed her case with surgery and hospital medicine.  I suspect if we treat her pericarditis aggressively this may continue to improve.  We did start colchicine  and she is already noted some improvement.  I have reversed her INR since she should likely remain off anticoagulation while being treated for acute pericarditis due to high risk for hemorrhagic conversion.  If her INR continues to improve we will likely can start her on high-dose ibuprofen  tomorrow.  Hopefully she will notice drastic improvement with fluid retention if we can treat the [facility] aggressively.  Her CRP can be trended to document resolution of pericarditis.  Suspect she will also need a repeat cardiac MRI in several months as well.  For now, we will plan to treat her pericarditis aggressively and continue with diuresis.  I think she can avoid a pericardial window and/or pericardiocentesis for now.  I discussed this with the patient this afternoon. She was in agreement.   Signed, Darryle DASEN. Barbaraann, MD, [facility]  [facility]  [address] [place], [place] [address] [phone]  4:50 PM

## 2024-06-27 DIAGNOSIS — R0602 Shortness of breath: Secondary | ICD-10-CM | POA: Diagnosis not present

## 2024-06-27 LAB — BASIC METABOLIC PANEL WITH GFR
Anion gap: 5 (ref 5–15)
BUN: 13 mg/dL (ref 8–23)
CO2: 41 mmol/L — ABNORMAL HIGH (ref 22–32)
Calcium: 9.2 mg/dL (ref 8.9–10.3)
Chloride: 91 mmol/L — ABNORMAL LOW (ref 98–111)
Creatinine, Ser: 0.66 mg/dL (ref 0.44–1.00)
GFR, Estimated: >60 mL/min
Glucose, Bld: 91 mg/dL (ref 70–99)
Potassium: 4.3 mmol/L (ref 3.5–5.1)
Sodium: 138 mmol/L (ref 135–145)

## 2024-06-27 LAB — CBC
HCT: 30 % — ABNORMAL LOW (ref 36.0–46.0)
Hemoglobin: 9.7 g/dL — ABNORMAL LOW (ref 12.0–15.0)
MCH: 29.8 pg (ref 26.0–34.0)
MCHC: 32.3 g/dL (ref 30.0–36.0)
MCV: 92 fL (ref 80.0–100.0)
Platelets: 159 K/uL (ref 150–400)
RBC: 3.26 MIL/uL — ABNORMAL LOW (ref 3.87–5.11)
RDW: 16.5 % — ABNORMAL HIGH (ref 11.5–15.5)
WBC: 7.5 K/uL (ref 4.0–10.5)
nRBC: 0 % (ref 0.0–0.2)

## 2024-06-27 LAB — PROTIME-INR
INR: 1.4 — ABNORMAL HIGH (ref 0.8–1.2)
Prothrombin Time: 18.1 s — ABNORMAL HIGH (ref 11.4–15.2)

## 2024-06-27 LAB — GLUCOSE, CAPILLARY
Glucose-Capillary: 115 mg/dL — ABNORMAL HIGH (ref 70–99)
Glucose-Capillary: 117 mg/dL — ABNORMAL HIGH (ref 70–99)
Glucose-Capillary: 120 mg/dL — ABNORMAL HIGH (ref 70–99)
Glucose-Capillary: 88 mg/dL (ref 70–99)
Glucose-Capillary: 91 mg/dL (ref 70–99)

## 2024-06-27 LAB — MAGNESIUM: Magnesium: 1.8 mg/dL (ref 1.7–2.4)

## 2024-06-27 MED ORDER — HEPARIN SODIUM (PORCINE) 5000 UNIT/ML IJ SOLN
5000.0000 [IU] | Freq: Three times a day (TID) | INTRAMUSCULAR | Status: DC
  Administered 2024-06-27 – 2024-07-01 (×12): 5000 [IU] via SUBCUTANEOUS
  Filled 2024-06-27 (×12): qty 1

## 2024-06-27 MED ORDER — IBUPROFEN 600 MG PO TABS
600.0000 mg | ORAL_TABLET | Freq: Three times a day (TID) | ORAL | Status: DC
  Administered 2024-06-27 – 2024-06-28 (×2): 600 mg via ORAL
  Filled 2024-06-27 (×3): qty 1

## 2024-06-27 MED ORDER — POTASSIUM CHLORIDE CRYS ER 20 MEQ PO TBCR
40.0000 meq | EXTENDED_RELEASE_TABLET | Freq: Two times a day (BID) | ORAL | Status: AC
  Administered 2024-06-27: 40 meq via ORAL
  Filled 2024-06-27: qty 2

## 2024-06-27 MED ORDER — POTASSIUM CHLORIDE CRYS ER 20 MEQ PO TBCR
40.0000 meq | EXTENDED_RELEASE_TABLET | Freq: Every day | ORAL | Status: DC
  Administered 2024-06-28 – 2024-07-01 (×4): 40 meq via ORAL
  Filled 2024-06-27 (×4): qty 2

## 2024-06-27 MED ORDER — SPIRONOLACTONE 25 MG PO TABS
25.0000 mg | ORAL_TABLET | Freq: Every day | ORAL | Status: DC
  Administered 2024-06-28 – 2024-07-01 (×4): 25 mg via ORAL
  Filled 2024-06-27 (×4): qty 1

## 2024-06-27 NOTE — Progress Notes (Signed)
 OT Cancellation Note  Patient Details Name: [name] MRN: [id number] DOB: 07/24/1940   Cancelled Treatment:    Reason Eval/Treat Not Completed: Other (comment). Pt eating lunch upon OT arrival, requests OT return later. OT to follow-up as appropriate and schedule allows.  Maurilio CROME, OTR/LSABRA  [facility]  Office: [phone]   Maurilio PARAS [name] 06/27/2024, 1:05 PM

## 2024-06-27 NOTE — Progress Notes (Signed)
 "                                                                                                                                                                                                                                                                                PROGRESS NOTE     Patient Demographics:    [name], is a 84 y.o. female, DOB - 05-Feb-1941, FMW:[id number]  Outpatient Primary MD for the patient is [name], MD    LOS - 6  Admit date - 06/21/2024    Chief Complaint  Patient presents with   Shortness of Breath       Brief Narrative (HPI from H&P)   84 y.o. female with past medical history  of   thyroid  cancer, hyperlipidemia, hypertension, hypothyroid, anxiety, arthritis, OSA on CPAP, and history of sarcoidosis Most recently she was admitted from June 10, 2024 with shock, Staphylococcus epidermidis bacteremia, acute kidney injury, hyperkalemia, and moderate pericardial effusion and discharged June 20, 2024, presented back to ED with shortness of breath.   During recent hospitalization, She was initially admitted to the ICU ,received fresh frozen plasma, vitamin K , lokelma , bicarbonate infusion, and empiric antibiotics. Echocardiography revealed an ejection fraction of 45-50% with a large pericardial effusion without tamponade. Pt had S. epidermidis bacteremia / completed 7 days of IV linezolid .  Patient was seen by cardiology on January 7 for her pericardial effusion with no tamponade on echo with plan for recheck echocardiogram.  Patient was discharged on 5 L of oxygen .  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  Diagnosed with acute hypoxic respiratory failure.  Transferred to my care on 06/23/2024.    Subjective:   Patient in bed, appears comfortable,  denies any headache, no fever, no chest pain or pressure, no shortness of breath , no abdominal pain. No focal weakness.   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  Patient currently on 10 L  nasal cannula oxygen , has been seen by cardiology and pulmonary, left-sided pleural effusion has progressed considerably hence IR requested to do ultrasound-guided therapeutic and diagnostic thoracentesis on 06/23/2024 IR and again on 06/26/2023 and by pulmonary, continue supportive care, CTA was nonacute without any PE.  There is a question of aspiration pneumonia for which she finished antibiotics and cleared by speech.  Continue diuresis as tolerated with IV diuretics, she has undergone ultrasound-guided thoracentesis twice once by IR and once by pulmonary as above fluid suggestive of transudative collection.  Initially she had moderate pericardial effusion but after she was placed on colchicine  her pericardial effusion has significantly improved on repeat echocardiogram on 06/26/2024, clinically appears to be stable.  Continue to monitor appreciate cardiology and pulmonary follow-up.  Defer management of pericardial effusion to cardiology and recurrent pleural effusion likely due to the constrictive pericarditis pulmonary.  For now continue colchicine , diuretics.  Monitor clinically.  Improving.    Permanent atrial fibrillation/history of PE: Rate controlled.  Continue Coreg  and Cardizem .  On Coumadin , INR slightly supratherapeutic.  INR now subtherapeutic anticoagulation held per cardiology indefinitely for now.  Lab Results  Component Value Date   INR 1.4 (H) 06/27/2024   INR 2.2 (H) 06/26/2024   INR 2.8 (H) 06/25/2024   PROTIME 25.2 (H) 11/29/2014   Hypertension: Blood pressure controlled.  Continue Cardizem , holding home dose of Avapro .   GERD:   on  PPI.   Hypokalemia, hypophosphatemia.  Replaced.    Acquired hypothyroidism: Continue Synthroid .   OSA: Nightly CPAP ordered.   Anemia of chronic disease: Stable.   Obesity.  BMI 38.  Follow-up with PCP.  Type 2 diabetes mellitus:   Not on any medications PTA.  Currently on SSI.  Lab Results  Component Value Date   HGBA1C 7.6 (H) 06/10/2024   CBG (last 3)  Recent Labs    06/27/24 0010 06/27/24 0355 06/27/24 0739  GLUCAP 117* 88 91        Condition - Extremely Guarded  Family Communication  : Husband [name] [phone]  called on 06/23/2024 at 8:07 AM  Code Status : Full code  Consults  : IR, cardiology, pulmonary  PUD Prophylaxis :     Procedures  :     Repeat echocardiogram 06/26/2024.    1. Small pericardial effusion posterior to the LV. Poorly interrogated on this study. IVC dilated without collapse. Respiraophasic septal shift noted, indicative of increased ventricular interdependence. a small pericardial effusion is present. The pericardial effusion is posterior to the left ventricle. There is no evidence of cardiac tamponade.  2. Left ventricular ejection fraction, by estimation, is 55 to 60%. The left ventricle has normal function. Left ventricular endocardial border not optimally defined to evaluate regional wall motion. Left ventricular diastolic function could not be evaluated.  3. Right ventricular systolic function is mildly reduced. The right ventricular size is moderately enlarged.  4. Left atrial size was severely dilated.  5. Right atrial size was severely dilated.  6. The mitral valve is grossly normal. Trivial mitral valve regurgitation. No evidence of mitral stenosis.  7. The inferior vena cava is dilated in size with <50% respiratory variability, suggesting right atrial pressure of 15 mmHg   Repeat left-sided ultrasound-guided thoracentesis by pulmonary on 06/25/2024.    Left ultrasound-guided thoracentesis by IR on 06/23/2024.  900 cc fluid removed,  chemistries pending  CTA.  1. No evidence of pulmonary embolism. 2. Small to moderate size right pleural effusion with associated basilar atelectasis. Small left pleural effusion  with associated basilar atelectasis. 3. Subtle hazy density over the perihilar region of the left upper lobe possibly due to minimal asymmetric edema and less likely early infection. 4. Mild stable cardiomegaly with interval decrease in pericardial effusion. Atherosclerotic coronary artery disease. 5. Aortic atherosclerosis. 6. Evidence of old granulomatous disease. Aortic Atherosclerosis (ICD10-I70.0).  Cardiac MRI.  1. There is a moderate pericardial effusion. The inferior vena cava is dilated with reduced respiratory collapse, consistent with elevated right sided filling pressures. In diastole, the left ventricle has a D shaped configuration with accentuated respiratory variation in septal position (septal bounce) on strain imaging and with free breathing, indicating ventricular interdependence. This is seen during inspiration and can be seen in constrictive physiology. 2. Normal left ventricular systolic function (LVEF =61%). 3. Decreased right ventricular systolic function (RVEF =38%). 4. There are bilateral pleural effusions with secondary atelectasis  Echocardiogram -  1. Moderate pericardial effusion which is more prominent up to 2.0 cm posterior to the LV (best seen subcostal; image 49). The RV is poorly visualized, but does not appear to collapse in diastole. The IVC is dilated. There is evidence of respiratory variation in septal movement on image 41. Findings are suggestive of a clinically significant pericardial effusion. Clinical correlation is recommended. Appearance is stable compared to prior study. Moderate pericardial effusion. The pericardial effusion  is circumferential.  2. Right ventricular systolic function was not well visualized. The right ventricular size is not well visualized. Tricuspid regurgitation  signal is inadequate for assessing PA pressure.  3. The mitral valve is grossly normal. Trivial mitral valve regurgitation. No evidence of mitral stenosis.  4. The aortic valve is tricuspid. Aortic valve regurgitation is not visualized. Aortic valve sclerosis is present, with no evidence of aortic valve stenosis.  5. The inferior vena cava is dilated in size with <50% respiratory variability, suggesting right atrial pressure of 15 mmHg.  6. Left ventricular ejection fraction, by estimation, is 60 to 65%. The left ventricle has normal function. Left ventricular endocardial border not optimally defined to evaluate regional wall motion. Comparison(s): No significant change from prior study.      Disposition Plan  :    Status is: Inpatient PPI  DVT Prophylaxis  : Coumadin   Lab Results  Component Value Date   INR 1.4 (H) 06/27/2024   INR 2.2 (H) 06/26/2024   INR 2.8 (H) 06/25/2024   PROTIME 25.2 (H) 11/29/2014     Lab Results  Component Value Date   PLT 159 06/27/2024    Diet :  Diet Order             DIET DYS 3 Room service appropriate? Yes; Fluid consistency: Thin  Diet effective now                    Inpatient Medications  Scheduled Meds:  colchicine   0.6 mg Oral BID   diltiazem   360 mg Oral Daily   furosemide   40 mg Intravenous BID   heparin  injection (subcutaneous)  5,000 Units Subcutaneous Q8H   lactose free nutrition  237 mL Oral BID WC   levothyroxine   112 mcg Oral q morning   pantoprazole   40 mg Oral Daily   potassium chloride   40 mEq Oral BID   sodium chloride  flush  3 mL Intravenous Q12H   spironolactone   12.5 mg Oral Daily   Continuous Infusions:   PRN Meds:.acetaminophen  **OR** acetaminophen , alum & mag hydroxide-simeth, artificial tears, guaiFENesin , LORazepam , ondansetron  **OR** ondansetron  (ZOFRAN ) IV, polyethylene glycol  Objective:   Vitals:   06/26/24 2221 06/27/24 0008 06/27/24 0100 06/27/24 0741  BP:  (!) 124/110  120/64  Pulse: 68   87 80  Resp: (!) 21  (!) 21   Temp:  98.4 F (36.9 C)  97.9 F (36.6 C)  TempSrc:  Oral  Oral  SpO2: 95%  94% 95%  Weight:      Height:        Wt Readings from Last 3 Encounters:  06/21/24 102.1 kg  06/20/24 108.7 kg  05/27/24 109.3 kg     Intake/Output Summary (Last 24 hours) at 06/27/2024 0953 Last data filed at 06/27/2024 0742 Gross per 24 hour  Intake 290.53 ml  Output 2100 ml  Net -1809.47 ml     Physical Exam  Awake Alert, No new F.N deficits, in mild respiratory distress [place].AT,PERRAL Supple Neck, No JVD,   Symmetrical Chest wall movement, Good air movement bilaterally, reduced bibasilar breath sounds left more reduced than right RRR,No Gallops,Rubs or new Murmurs,  +ve B.Sounds, Abd Soft, No tenderness,   1+ chronic bilateral lower extremity edema     RN pressure injury documentation: Wound 06/22/24 1541 Pressure Injury Sacrum Medial Stage 2 -  Partial thickness loss of dermis presenting as a shallow open injury with a red, pink wound bed without slough. (Active)      Data Review:    Recent Labs  Lab 06/21/24 1237 06/22/24 0404 06/23/24 0740 06/24/24 0411 06/25/24 0436 06/26/24 0545 06/27/24 0255  WBC 10.9* 10.0 8.4 7.6 7.4 6.5 7.5  HGB 10.7* 10.1* 10.3* 9.7* 9.2* 9.2* 9.7*  HCT 33.8* 32.6* 31.8* 30.2* 28.6* 27.7* 30.0*  PLT 175 156 109* 103* 103* 109* 159  MCV 93.1 93.4 91.6 92.6 91.7 91.4 92.0  MCH 29.5 28.9 29.7 29.8 29.5 30.4 29.8  MCHC 31.7 31.0 32.4 32.1 32.2 33.2 32.3  RDW 16.4* 16.4* 16.5* 16.5* 16.6* 16.4* 16.5*  LYMPHSABS 0.7 0.9 1.0 1.3 1.6  --   --   MONOABS 0.9 1.0 1.2* 1.6* 2.1*  --   --   EOSABS 0.1 0.1 0.1 0.1 0.1  --   --   BASOSABS 0.0 0.0 0.0 0.0 0.0  --   --     Recent Labs  Lab 06/21/24 1237 06/21/24 1501 06/21/24 2223 06/22/24 0404 06/23/24 0300 06/23/24 0740 06/24/24 0411 06/25/24 0436 06/25/24 1239 06/26/24 0545 06/27/24 0255  NA 133*  --   --    < >  --  139 140 141  --  137 138  K 4.2  --   --    < >  --   3.4* 3.4* 3.5  --  3.3* 4.3  CL 93*  --   --    < >  --  91* 93* 93*  --  91* 91*  CO2 32  --   --    < >  --  38* 39* 41*  --  37* 41*  ANIONGAP 9  --   --    < >  --  10 8 6   --  8 5  GLUCOSE 134*  --   --    < >  --  95 86 88  --  98 91  BUN 22  --   --    < >  --  16 17 19   --  16 13  CREATININE 0.94  --   --    < >  --  0.68 0.64 0.68  --  0.68  0.66  AST 26  --   --   --   --  29  --   --   --   --   --   ALT 16  --   --   --   --  19  --   --   --   --   --   ALKPHOS 154*  --   --   --   --  147*  --   --   --   --   --   BILITOT 0.6  --   --   --   --  0.6  --   --   --   --   --   ALBUMIN 2.8*  --   --   --   --  2.8*  --   --  2.7*  --   --   CRP  --   --   --   --   --  7.6*  --   --   --   --   --   PROCALCITON  --   --   --   --   --  <0.10  --   --   --   --   --   LATICACIDVEN  --  2.7* 1.8  --   --   --   --   --   --   --   --   INR 2.6*  --   --    < > 3.9*  --  3.3* 2.8*  --  2.2* 1.4*  MG  --   --   --    < >  --  1.7 1.9 2.1  --  1.8 1.8  PHOS  --   --   --   --   --   --  2.1* 2.6  --   --   --   CALCIUM  8.3*  --   --    < >  --  8.4* 8.3* 8.6*  --  8.8* 9.2   < > = values in this interval not displayed.      Recent Labs  Lab 06/21/24 1501 06/21/24 2223 06/22/24 0404 06/23/24 0300 06/23/24 0740 06/24/24 0411 06/25/24 0436 06/26/24 0545 06/27/24 0255  CRP  --   --   --   --  7.6*  --   --   --   --   PROCALCITON  --   --   --   --  <0.10  --   --   --   --   LATICACIDVEN 2.7* 1.8  --   --   --   --   --   --   --   INR  --   --    < > 3.9*  --  3.3* 2.8* 2.2* 1.4*  MG  --   --    < >  --  1.7 1.9 2.1 1.8 1.8  CALCIUM   --   --    < >  --  8.4* 8.3* 8.6* 8.8* 9.2   < > = values in this interval not displayed.    --------------------------------------------------------------------------------------------------------------- Lab Results  Component Value Date   CHOL 242 (H) 01/12/2023   HDL 62 01/12/2023   LDLCALC 157 (H) 01/12/2023   TRIG 130 01/12/2023    CHOLHDL 3.9 01/12/2023    Lab Results  Component Value Date   HGBA1C 7.6 (H) 06/10/2024   No  results for input(s): TSH, T4TOTAL, FREET4, T3FREE, THYROIDAB in the last 72 hours. No results for input(s): VITAMINB12, FOLATE, FERRITIN, TIBC, IRON, RETICCTPCT in the last 72 hours. ------------------------------------------------------------------------------------------------------------------ Cardiac Enzymes No results for input(s): CKMB, TROPONINI, MYOGLOBIN in the last 168 hours.  Invalid input(s): CK  Micro Results Recent Results (from the past 240 hours)  Resp panel by RT-PCR (RSV, Flu A&B, Covid) Anterior Nasal Swab     Status: None   Collection Time: 06/21/24 12:26 PM   Specimen: Anterior Nasal Swab  Result Value Ref Range Status   SARS Coronavirus 2 by RT PCR NEGATIVE NEGATIVE Final   Influenza A by PCR NEGATIVE NEGATIVE Final   Influenza B by PCR NEGATIVE NEGATIVE Final    Comment: (NOTE) The Xpert Xpress SARS-CoV-2/FLU/RSV plus assay is intended as an aid in the diagnosis of influenza from Nasopharyngeal swab specimens and should not be used as a sole basis for treatment. Nasal washings and aspirates are unacceptable for Xpert Xpress SARS-CoV-2/FLU/RSV testing.  Fact Sheet for Patients: bloggercourse.com  Fact Sheet for Healthcare Providers: seriousbroker.it  This test is not yet approved or cleared by the [place]  FDA and has been authorized for detection and/or diagnosis of SARS-CoV-2 by FDA under an Emergency Use Authorization (EUA). This EUA will remain in effect (meaning this test can be used) for the duration of the COVID-19 declaration under Section 564(b)(1) of the Act, 21 U.S.C. section 360bbb-3(b)(1), unless the authorization is terminated or revoked.     Resp Syncytial Virus by PCR NEGATIVE NEGATIVE Final    Comment: (NOTE) Fact Sheet for  Patients: bloggercourse.com  Fact Sheet for Healthcare Providers: seriousbroker.it  This test is not yet approved or cleared by the [place]  FDA and has been authorized for detection and/or diagnosis of SARS-CoV-2 by FDA under an Emergency Use Authorization (EUA). This EUA will remain in effect (meaning this test can be used) for the duration of the COVID-19 declaration under Section 564(b)(1) of the Act, 21 U.S.C. section 360bbb-3(b)(1), unless the authorization is terminated or revoked.  Performed at [facility], [address], [place], [place] [address]   Body fluid culture w Gram Stain     Status: None (Preliminary result)   Collection Time: 06/25/24 11:45 AM   Specimen: Pleural Fluid  Result Value Ref Range Status   Specimen Description PLEURAL  Final   Special Requests PLEURAL,LEFT  Final   Gram Stain   Final    RARE WBC PRESENT, PREDOMINANTLY MONONUCLEAR NO ORGANISMS SEEN    Culture   Final    NO GROWTH 2 DAYS Performed at [facility], [address] [place], [place] [address]    Report Status PENDING  Incomplete    Radiology Report ECHOCARDIOGRAM LIMITED Result Date: 06/26/2024    ECHOCARDIOGRAM LIMITED REPORT   Patient Name:   RONNAE KASER Date of Exam: 06/26/2024 Medical Rec #:  [id number]            Height:       64.0 in Accession #:    [id number]           Weight:       225.0 lb Date of Birth:  08-14-1940            BSA:          2.057 m Patient Age:    83 years             BP:  119/53 mmHg Patient Gender: F                    HR:           75 bpm. Exam Location:  Inpatient Procedure: Limited Echo, Cardiac Doppler and Limited Color Doppler (Both            Spectral and Color Flow Doppler were utilized during procedure). Indications:     Pericardial Effusion I31.3  History:         Patient has prior history of Echocardiogram examinations, most                  recent 06/21/2024.  Pericardial Effusion; Risk Factors:Sleep                  Apnea and Hypertension.  Sonographer:     Koleen Popper RDCS Referring Phys:  DARRYLE NED O'NEAL Diagnosing Phys: Darryle Decent MD  Sonographer Comments: Image acquisition challenging due to patient body habitus. IMPRESSIONS  1. Small pericardial effusion posterior to the LV. Poorly interrogated on this study. IVC dilated without collapse. Respiraophasic septal shift noted, indicative of increased ventricular interdependence. a small pericardial effusion is present. The pericardial effusion is posterior to the left ventricle. There is no evidence of cardiac tamponade.  2. Left ventricular ejection fraction, by estimation, is 55 to 60%. The left ventricle has normal function. Left ventricular endocardial border not optimally defined to evaluate regional wall motion. Left ventricular diastolic function could not be evaluated.  3. Right ventricular systolic function is mildly reduced. The right ventricular size is moderately enlarged.  4. Left atrial size was severely dilated.  5. Right atrial size was severely dilated.  6. The mitral valve is grossly normal. Trivial mitral valve regurgitation. No evidence of mitral stenosis.  7. The inferior vena cava is dilated in size with <50% respiratory variability, suggesting right atrial pressure of 15 mmHg. Comparison(s): No significant change from prior study. FINDINGS  Left Ventricle: Left ventricular ejection fraction, by estimation, is 55 to 60%. The left ventricle has normal function. Left ventricular endocardial border not optimally defined to evaluate regional wall motion. The left ventricular internal cavity size was normal in size. There is no left ventricular hypertrophy. Left ventricular diastolic function could not be evaluated. Left ventricular diastolic function could not be evaluated due to atrial fibrillation. Right Ventricle: The right ventricular size is moderately enlarged. No increase in right  ventricular wall thickness. Right ventricular systolic function is mildly reduced. Left Atrium: Left atrial size was severely dilated. Right Atrium: Right atrial size was severely dilated. Pericardium: Small pericardial effusion posterior to the LV. Poorly interrogated on this study. IVC dilated without collapse. Respiraophasic septal shift noted, indicative of increased ventricular interdependence. A small pericardial effusion is present.  The pericardial effusion is posterior to the left ventricle. There is no evidence of cardiac tamponade. Mitral Valve: The mitral valve is grossly normal. Trivial mitral valve regurgitation. No evidence of mitral valve stenosis. Aorta: The aortic root and ascending aorta are structurally normal, with no evidence of dilitation. Venous: The inferior vena cava is dilated in size with less than 50% respiratory variability, suggesting right atrial pressure of 15 mmHg. IAS/Shunts: The atrial septum is grossly normal. Additional Comments: Spectral Doppler performed. Color Doppler performed.  LEFT VENTRICLE PLAX 2D LVIDd:         4.70 cm LVIDs:         3.50 cm LV PW:  1.00 cm LV IVS:        1.20 cm LVOT diam:     2.00 cm LV SV:         71 LV SV Index:   34 LVOT Area:     3.14 cm  RIGHT VENTRICLE             IVC RV S prime:     11.90 cm/s  IVC diam: 2.50 cm TAPSE (M-mode): 1.3 cm LEFT ATRIUM           Index LA diam:      4.40 cm 2.14 cm/m LA Vol (A2C): 72.0 ml 35.01 ml/m  AORTIC VALVE LVOT Vmax:   133.00 cm/s LVOT Vmean:  86.800 cm/s LVOT VTI:    0.225 m  AORTA Ao Root diam: 2.80 cm Ao Asc diam:  3.40 cm MITRAL VALVE MV Area (PHT): 5.66 cm     SHUNTS MV Decel Time: 134 msec     Systemic VTI:  0.22 m MR Peak grad: 62.4 mmHg     Systemic Diam: 2.00 cm MR Vmax:      395.00 cm/s MV E velocity: 138.00 cm/s MV A velocity: 36.90 cm/s MV E/A ratio:  3.74 Darryle Decent MD Electronically signed by Darryle Decent MD Signature Date/Time: 06/26/2024/3:15:19 PM    Final (Updated)    DG CHEST  PORT 1 VIEW Result Date: 06/25/2024 EXAM: 1 VIEW(S) XRAY OF THE CHEST 06/25/2024 12:02:00 PM COMPARISON: 06/25/2024 CLINICAL HISTORY: S/P thoracentesis. FINDINGS: LUNGS AND PLEURA: Decreased left pleural effusion. Improved aeration of left lung base. Interstitial opacities, likely pulmonary edema. No pneumothorax. HEART AND MEDIASTINUM: Unchanged cardiomegaly. Tortuous aorta with aortic atherosclerosis. Calcified mediastinal lymph nodes. BONES AND SOFT TISSUES: Multilevel thoracic osteophytosis. IMPRESSION: 1. Decreased left pleural effusion and improved aeration of the left lung base. No pneumothorax identified following thoracentesis. 2. Interstitial opacities, likely pulmonary edema. 3. Unchanged cardiomegaly. Electronically signed by: Waddell Calk MD 06/25/2024 12:22 PM EST RP Workstation: [id number]     Signature  -   Lavada Stank M.D on 06/27/2024 at 9:53 AM   -  To page go to [email or web address]   "

## 2024-06-27 NOTE — Progress Notes (Addendum)
 "  Progress Note  Patient Name: [name] Date of Encounter: 06/27/2024 [facility] Cardiologist: Georganna Archer, MD   Interval Summary   Husband accompanied with her in the room.  Had an episode of shortness of breath last night but feels like she is improving.  Had lots of questions about her hospitalization.  Vital Signs Vitals:   06/26/24 2221 06/27/24 0008 06/27/24 0100 06/27/24 0741  BP:  (!) 124/110  120/64  Pulse: 68  87 80  Resp: (!) 21  (!) 21   Temp:  98.4 F (36.9 C)  97.9 F (36.6 C)  TempSrc:  Oral  Oral  SpO2: 95%  94% 95%  Weight:      Height:        Intake/Output Summary (Last 24 hours) at 06/27/2024 1106 Last data filed at 06/27/2024 1016 Gross per 24 hour  Intake 290.53 ml  Output 2600 ml  Net -2309.47 ml      06/21/2024    9:25 PM 06/20/2024    4:45 AM 06/19/2024    5:00 AM  Last 3 Weights  Weight (lbs) 225 lb 239 lb 10.2 oz 240 lb 8.4 oz  Weight (kg) 102.059 kg 108.7 kg 109.1 kg      Telemetry/ECG  Atrial fibrillation with controlled ventricular rates- Personally Reviewed  Physical Exam  GEN: No acute distress.   Neck: No JVD Cardiac: IRRR, no murmurs, rubs, or gallops.  Respiratory: +crackles L side GI: Soft, nontender, non-distended  MS: mild edema  Patient Profile Patient with past medical history significant for permanent atrial fibrillation, pericardial effusion, DVT/PE 2008, hypertension, hypothyroidism, OSA on CPAP, sarcoidosis.  05/2024 admitted for A-fib RVR, cellulitis 06/10/2024 admission for sepsis, staph epidermis bacteremia and large pericardial effusion in the setting of uremia.  1/06 moderate pericardial effusion  Current admission with complaints of increased shortness of breath and hypoxia and found to have constrictive pericardial pericarditis with persistent pericardial effusion with recurrent pleural effusions.  Assessment & Plan   Constrictive pericarditis with pericardial effusion Acute  HFpEF -1/02 large pericardial effusion in the setting of uremia -1/6 moderate pericardial effusion -1/13 stable moderate circumferential pericardial effusion - 1/14 cMRI with moderate pericardial effusion consistent with constrictive physiology.  LVEF 61% - 1/18 limited echocardiogram small pericardial effusion posterior to the LV, no tamponade.  INR reversed, warfarin stopped.  Pericardium is not calcified to suggest advanced progression.  Cardiology and CT surgery in agreement with conservative management and aggressive treatment for pericarditis.  Avoiding pericardial window or pericardiocentesis for the time being.  SOB is her main complaint, not having any chest pain. CRP 7.6, ESR 58.  Downtrending based off of prior labs.  Current plan is to reevaluate inflammatory markers and consider repeat cardiac MRI in several months.  Likely will need another limited echocardiogram in the next 2 to 4 weeks but will discuss with MD exact timing and plan. Holding off on NSAIDs until INR is less than 2.  Concern of hemorrhagic conversion.  She is off of warfarin.  INR is 1.4 today.  Will defer to MD about NSAID therapy/duration.   Continue with colchicine  0.6 mg twice daily also with Protonix  40 mg daily. Continue with IV Lasix  40 mg twice daily, RA pressure of 15 on yesterday's echocardiogram and with crackles on physical exam.  -1.8 L.  Renal function tolerating. Will increase spironolactone  to 25 mg from 12.5.  Hopefully this will aid in diuresis and decreased demand for potassium supplementation.  Currently on potassium supplementation  40 mEq twice daily, will switch this to daily starting tomorrow.  Can always add back if needed.  Permanent atrial fibrillation Controlled ventricular rates around 80-100. Continue with diltiazem  360 mg daily.  No warfarin as above.  Recurrent pleural effusions -06/23/2024 most recent thoracentesis, 900 cc removed.  Still pending cytology  Has previously required repeat  thoracentesis.  Decreased left-sided pleural effusion 1/17 chest x-ray.  History of DVT/PE Holding warfarin as above   For questions or updates, please contact [facility] Please consult [email or web address] for contact info under       Signed, Thom LITTIE Sluder, PA-C    "

## 2024-06-27 NOTE — Progress Notes (Signed)
 Nutrition Follow-up  DOCUMENTATION CODES:  Severe malnutrition in context of acute illness/injury  INTERVENTION:  Continue Boost Plus PO BID, each supplement provides 360 kcal and 14 gm protein Add high protein HS snack daily  NUTRITION DIAGNOSIS:  Severe Malnutrition related to acute illness (multiple recent hospitalizations) as evidenced by moderate muscle depletion, percent weight loss, energy intake < or equal to 50% for > or equal to 5 days; ongoing.  GOAL:  Patient will meet greater than or equal to 90% of their needs; progressing.  MONITOR:  PO intake, Weight trends, Supplement acceptance, I & O's, Labs  REASON FOR ASSESSMENT:  Consult  (nutritional goals)  ASSESSMENT:  Pt with hx of thyroid  cancer, hypothyroidism, OSA, HTN, HLD, atrial fibrillation, DM type 2, and hx of sarcoidosis presented to ED with SOB from SNF after being discharged from [facility] the day prior. Found to have worsening pericardial effusion.  1/16: S/P esophagram, found to have moderate-severe esophageal dysmotility 1/17: L thoracentesis with 600 ml fluid removed  Spoke with patient at bedside. She says she has been eating poorly d/t difficulty swallowing. Meal intakes not recorded, but patient stated that she had half of her oatmeal for breakfast today. She has been drinking some Boost Plus supplements, but doesn't want to drink too much because of her heart problems. We discussed Cortrak placement for enteral nutrition supplementation, but patient stated, I do not want that. She agreed to drink the Boost Breeze supplements and was open to receiving a bedtime snack.  Admit / current weight: 102.1 kg  Average Meal Intake: Not recorded  Nutritionally Relevant Medications: Scheduled Meds:  colchicine   0.6 mg Oral BID   diltiazem   360 mg Oral Daily   furosemide   40 mg Intravenous BID   heparin  injection (subcutaneous)  5,000 Units Subcutaneous Q8H   lactose free nutrition  237 mL Oral BID WC    levothyroxine   112 mcg Oral q morning   pantoprazole   40 mg Oral Daily   potassium chloride   40 mEq Oral BID   [START ON 06/28/2024] potassium chloride   40 mEq Oral Daily   sodium chloride  flush  3 mL Intravenous Q12H   [START ON 06/28/2024] spironolactone   25 mg Oral Daily   Continuous Infusions: PRN Meds:.acetaminophen  **OR** acetaminophen , alum & mag hydroxide-simeth, artificial tears, guaiFENesin , LORazepam , ondansetron  **OR** ondansetron  (ZOFRAN ) IV, polyethylene glycol  Labs Reviewed: CBG ranges from 88-167 mg/dL over the last 24 hours HgbA1c 7.6  Diet Order:   Diet Order             DIET DYS 3 Room service appropriate? Yes; Fluid consistency: Thin  Diet effective now                   EDUCATION NEEDS:  Education needs have been addressed  Skin:  Skin Assessment: Skin Integrity Issues: Skin Integrity Issues:: Stage II, Other (Comment) Stage II: sacrum Other: cellulitis BLE  Last BM:  1/15  Height:  Ht Readings from Last 1 Encounters:  06/21/24 5' 4 (1.626 m)   Weight:  Wt Readings from Last 1 Encounters:  06/21/24 102.1 kg   Ideal Body Weight:  54.5 kg  BMI:  Body mass index is 38.62 kg/m.  Estimated Nutritional Needs:  Kcal:  1600-1800 kcal/d Protein:  80-95g/d Fluid:  1.8L/d   Suzen HUNT RD, LDN, CNSC Contact via secure chat. If unavailable, use group chat RD Inpatient.

## 2024-06-27 NOTE — Progress Notes (Signed)
 Heart Failure Navigator Progress Note  Assessed for Heart & Vascular TOC clinic readiness.  Patient does not meet criteria due to symptoms not related to CHF, has effusive constrictive pericarditis.   Navigator available for reassessment of patient.   Duwaine Plant, PharmD, BCPS Heart Failure Stewardship Pharmacist Phone [phone]

## 2024-06-27 NOTE — Progress Notes (Signed)
 Occupational Therapy Treatment Patient Details Name: KEIRAN SIAS MRN: [id number] DOB: 1940/09/10 Today's Date: 06/27/2024   History of present illness Pt is 84 year old presented to [facility] on  06/21/24 for SOB. Was just discharged from [facility] to [facility] on 06/20/24. At [facility] pt with bacteremia and pericardial effusion. Pt now with acute hypoxic respiratory failure, pleural effusion, and acute on chronic CHF.  PMH - CHF, HTN, arthritis, anxiety, thyroid  CA, afib, PE, DM, OSA   OT comments  Pt progressing well towards OT goals. Focus of session on progressing functional mobility and increasing activity tolerance for increased independent engagement in ADL tasks OOB. Pt required up to Min A for functional transfers. Pt continues to require increased assistance up to Max A for LB and toileting tasks. OT to continue to follow Pt acutely to facilitate progress towards goals. Continue per POC.       If plan is discharge home, recommend the following:  A little help with walking and/or transfers;A lot of help with bathing/dressing/bathroom;Assistance with cooking/housework;Direct supervision/assist for medications management;Assist for transportation;Help with stairs or ramp for entrance   Equipment Recommendations  Other (comment) (defer)    Recommendations for Other Services      Precautions / Restrictions Precautions Precautions: Fall;Other (comment) Recall of Precautions/Restrictions: Intact Precaution/Restrictions Comments: monitor O2 Restrictions Weight Bearing Restrictions Per Provider Order: No       Mobility Bed Mobility Overal bed mobility: Needs Assistance Bed Mobility: Supine to Sit, Sit to Supine     Supine to sit: Supervision, HOB elevated, Used rails Sit to supine: Min assist   General bed mobility comments: Supervision to come to EOB on L side, increased time beneficial. Min A to return to supine for management of BLE assisting Pt back onto bed.     Transfers Overall transfer level: Needs assistance Equipment used: Rolling walker (2 wheels) Transfers: Sit to/from Stand Sit to Stand: Contact guard assist           General transfer comment: CGA with RW for safety. Pt requesting to ambualte in room, simulating mobility around house for functional mobility with ADL tasks. Pt with good balance during activity in standing. CGA to supervision.     Balance Overall balance assessment: Needs assistance Sitting-balance support: No upper extremity supported, Feet supported Sitting balance-Leahy Scale: Fair     Standing balance support: Bilateral upper extremity supported, During functional activity, Reliant on assistive device for balance Standing balance-Leahy Scale: Poor Standing balance comment: Dependent on RW                           ADL either performed or assessed with clinical judgement   ADL Overall ADL's : Needs assistance/impaired                 Upper Body Dressing : Minimal assistance;Sitting   Lower Body Dressing: Maximal assistance;Sitting/lateral leans   Toilet Transfer: BSC/3in1;Rolling walker (2 wheels);Contact guard assist   Toileting- Clothing Manipulation and Hygiene: Total assistance Toileting - Clothing Manipulation Details (indicate cue type and reason): Pt with incontinence during mobility            Extremity/Trunk Assessment Upper Extremity Assessment Upper Extremity Assessment: Overall WFL for tasks assessed            Vision       Perception     Praxis     Communication Communication Communication: Impaired Factors Affecting Communication: Hearing impaired   Cognition Arousal: Alert  Behavior During Therapy: WFL for tasks assessed/performed Cognition: No apparent impairments                               Following commands: Intact        Cueing   Cueing Techniques: Verbal cues  Exercises      Shoulder Instructions       General  Comments Pt required increased SpO2 needs during mobility. O2 increased from 3L to 4L. Able to titrate O2 back to 3L following return to bed. SpO2 87-95% during task    Pertinent Vitals/ Pain       Pain Assessment Pain Assessment: No/denies pain  Home Living                                          Prior Functioning/Environment              Frequency  Min 2X/week        Progress Toward Goals  OT Goals(current goals can now be found in the care plan section)  Progress towards OT goals: Progressing toward goals     Plan      Co-evaluation                 AM-PAC OT 6 Clicks Daily Activity     Outcome Measure   Help from another person eating meals?: None Help from another person taking care of personal grooming?: A Little Help from another person toileting, which includes using toliet, bedpan, or urinal?: Total Help from another person bathing (including washing, rinsing, drying)?: A Lot Help from another person to put on and taking off regular upper body clothing?: A Little Help from another person to put on and taking off regular lower body clothing?: A Lot 6 Click Score: 15    End of Session Equipment Utilized During Treatment: Rolling walker (2 wheels);Gait belt  OT Visit Diagnosis: Unsteadiness on feet (R26.81);Muscle weakness (generalized) (M62.81)   Activity Tolerance Patient tolerated treatment well   Patient Left in bed;with call bell/phone within reach;with bed alarm set;with family/visitor present   Nurse Communication          Time: 8481-8456 OT Time Calculation (min): 25 min  Charges: OT General Charges $OT Visit: 1 Visit OT Treatments $Self Care/Home Management : 8-22 mins $Therapeutic Activity: 8-22 mins  Maurilio CROME, OTR/L.  [facility]  Office: [phone]   Maurilio PARAS Yaden Seith 06/27/2024, 5:03 PM

## 2024-06-27 NOTE — Plan of Care (Signed)
" °  Problem: Education: Goal: Ability to describe self-care measures that may prevent or decrease complications (Diabetes Survival Skills Education) will improve Outcome: Progressing   Problem: Coping: Goal: Ability to adjust to condition or change in health will improve Outcome: Progressing   Problem: Fluid Volume: Goal: Ability to maintain a balanced intake and output will improve Outcome: Progressing   Problem: Health Behavior/Discharge Planning: Goal: Ability to manage health-related needs will improve Outcome: Progressing   Problem: Nutritional: Goal: Maintenance of adequate nutrition will improve Outcome: Progressing   Problem: Education: Goal: Knowledge of General Education information will improve Description: Including pain rating scale, medication(s)/side effects and non-pharmacologic comfort measures Outcome: Progressing   Problem: Clinical Measurements: Goal: Will remain free from infection Outcome: Progressing Goal: Diagnostic test results will improve Outcome: Progressing   Problem: Coping: Goal: Level of anxiety will decrease Outcome: Progressing   Problem: Safety: Goal: Ability to remain free from injury will improve Outcome: Progressing   "

## 2024-06-27 NOTE — TOC Progression Note (Signed)
 Transition of Care [facility]) - Progression Note    Patient Details  Name: [name] MRN: [id number] Date of Birth: 1941-02-26  Transition of Care [facility]) CM/SW Contact  Inocente GORMAN Kindle, LCSW Phone Number: 06/27/2024, 1:56 PM  Clinical Narrative:    CSW continuing to follow for medical stability and timing to start insurance authorization.    Expected Discharge Plan: Skilled Nursing Facility Barriers to Discharge: English As A Second Language Teacher, Continued Medical Work up               Expected Discharge Plan and Services In-house Referral: Clinical Social Work   Post Acute Care Choice: Skilled Nursing Facility Living arrangements for the past 2 months: Single Family Home                                       Social Drivers of Health (SDOH) Interventions SDOH Screenings   Food Insecurity: No Food Insecurity (06/22/2024)  Housing: Low Risk (06/22/2024)  Transportation Needs: No Transportation Needs (06/22/2024)  Utilities: Not At Risk (06/22/2024)  Depression (PHQ2-9): Low Risk (05/27/2024)  Social Connections: Moderately Integrated (06/22/2024)  Tobacco Use: Low Risk (06/21/2024)    Readmission Risk Interventions    06/13/2024   11:12 AM 09/19/2022    2:13 PM  Readmission Risk Prevention Plan  Post Dischage Appt  Complete  Medication Screening  Complete  Transportation Screening Complete   PCP or Specialist Appt within 3-5 Days Complete   HRI or Home Care Consult Complete   Social Work Consult for Recovery Care Planning/Counseling Complete   Palliative Care Screening Not Applicable   Medication Review Oceanographer) Complete

## 2024-06-27 NOTE — Plan of Care (Signed)
" °  Problem: Nutritional: Goal: Maintenance of adequate nutrition will improve Outcome: Progressing Goal: Progress toward achieving an optimal weight will improve Outcome: Progressing   Problem: Skin Integrity: Goal: Risk for impaired skin integrity will decrease Outcome: Progressing   Problem: Tissue Perfusion: Goal: Adequacy of tissue perfusion will improve Outcome: Progressing   Problem: Clinical Measurements: Goal: Ability to maintain clinical measurements within normal limits will improve Outcome: Progressing Goal: Will remain free from infection Outcome: Progressing Goal: Diagnostic test results will improve Outcome: Progressing   "

## 2024-06-28 ENCOUNTER — Other Ambulatory Visit (HOSPITAL_COMMUNITY): Payer: Self-pay

## 2024-06-28 ENCOUNTER — Telehealth (HOSPITAL_COMMUNITY): Payer: Self-pay

## 2024-06-28 DIAGNOSIS — R0602 Shortness of breath: Secondary | ICD-10-CM | POA: Diagnosis not present

## 2024-06-28 LAB — BASIC METABOLIC PANEL WITH GFR
Anion gap: 9 (ref 5–15)
BUN: 15 mg/dL (ref 8–23)
CO2: 33 mmol/L — ABNORMAL HIGH (ref 22–32)
Calcium: 9.2 mg/dL (ref 8.9–10.3)
Chloride: 93 mmol/L — ABNORMAL LOW (ref 98–111)
Creatinine, Ser: 0.96 mg/dL (ref 0.44–1.00)
GFR, Estimated: 57 mL/min — ABNORMAL LOW
Glucose, Bld: 122 mg/dL — ABNORMAL HIGH (ref 70–99)
Potassium: 4.7 mmol/L (ref 3.5–5.1)
Sodium: 136 mmol/L (ref 135–145)

## 2024-06-28 LAB — CBC
HCT: 31.1 % — ABNORMAL LOW (ref 36.0–46.0)
Hemoglobin: 9.9 g/dL — ABNORMAL LOW (ref 12.0–15.0)
MCH: 29.4 pg (ref 26.0–34.0)
MCHC: 31.8 g/dL (ref 30.0–36.0)
MCV: 92.3 fL (ref 80.0–100.0)
Platelets: 212 K/uL (ref 150–400)
RBC: 3.37 MIL/uL — ABNORMAL LOW (ref 3.87–5.11)
RDW: 16.4 % — ABNORMAL HIGH (ref 11.5–15.5)
WBC: 6.1 K/uL (ref 4.0–10.5)
nRBC: 0 % (ref 0.0–0.2)

## 2024-06-28 LAB — BODY FLUID CULTURE W GRAM STAIN: Culture: NO GROWTH

## 2024-06-28 LAB — SEDIMENTATION RATE: Sed Rate: 97 mm/h — ABNORMAL HIGH (ref 0–22)

## 2024-06-28 LAB — PROTIME-INR
INR: 1.2 (ref 0.8–1.2)
Prothrombin Time: 16 s — ABNORMAL HIGH (ref 11.4–15.2)

## 2024-06-28 LAB — C-REACTIVE PROTEIN: CRP: 2.7 mg/dL — ABNORMAL HIGH

## 2024-06-28 NOTE — TOC Progression Note (Signed)
 Transition of Care [facility]) - Progression Note    Patient Details  Name: KIEU QUIGGLE MRN: [id number] Date of Birth: 1941/01/03  Transition of Care [facility]) CM/SW Contact  Inocente GORMAN Kindle, LCSW Phone Number: 06/28/2024, 12:33 PM  Clinical Narrative:    Provided update to [facility].    Expected Discharge Plan: Skilled Nursing Facility Barriers to Discharge: English As A Second Language Teacher, Continued Medical Work up               Expected Discharge Plan and Services In-house Referral: Clinical Social Work   Post Acute Care Choice: Skilled Nursing Facility Living arrangements for the past 2 months: Single Family Home                                       Social Drivers of Health (SDOH) Interventions SDOH Screenings   Food Insecurity: No Food Insecurity (06/22/2024)  Housing: Low Risk (06/22/2024)  Transportation Needs: No Transportation Needs (06/22/2024)  Utilities: Not At Risk (06/22/2024)  Depression (PHQ2-9): Low Risk (05/27/2024)  Social Connections: Moderately Integrated (06/22/2024)  Tobacco Use: Low Risk (06/21/2024)    Readmission Risk Interventions    06/13/2024   11:12 AM 09/19/2022    2:13 PM  Readmission Risk Prevention Plan  Post Dischage Appt  Complete  Medication Screening  Complete  Transportation Screening Complete   PCP or Specialist Appt within 3-5 Days Complete   HRI or Home Care Consult Complete   Social Work Consult for Recovery Care Planning/Counseling Complete   Palliative Care Screening Not Applicable   Medication Review Oceanographer) Complete

## 2024-06-28 NOTE — Plan of Care (Signed)

## 2024-06-28 NOTE — Telephone Encounter (Signed)
 Pharmacy Patient Advocate Encounter  Insurance verification completed.    The patient is insured through [employer]. Patient has Medicare and is not eligible for a copay card, but may be able to apply for patient assistance or Medicare RX Payment Plan (Patient Must reach out to their plan, if eligible for payment plan), if available.    Ran test claim for Eliquis  5mg  tablet and the current 30 day co-pay is $248.41 due to deductible.   This test claim was processed through [facility]- copay amounts may vary at other pharmacies due to pharmacy/plan contracts, or as the patient moves through the different stages of their insurance plan.

## 2024-06-28 NOTE — Progress Notes (Signed)
"  °  Progress Note  Patient Name: [name] Date of Encounter: 06/28/2024 [facility] Cardiologist: Georganna Archer, MD   Interval Summary   Sitting up in bedside chair, continues on supplemental O2. Appears slightly better than yesterday.   Vital Signs Vitals:   06/28/24 0320 06/28/24 0827 06/28/24 0836 06/28/24 1226  BP: 124/67 126/62 126/62 112/72  Pulse: 86 91  82  Resp: (!) 21 (!) 24  20  Temp: (!) 97.5 F (36.4 C) 97.8 F (36.6 C)  97.6 F (36.4 C)  TempSrc: Oral Oral  Oral  SpO2: 97% 94%  96%  Weight:      Height:        Intake/Output Summary (Last 24 hours) at 06/28/2024 1412 Last data filed at 06/28/2024 1100 Gross per 24 hour  Intake 403 ml  Output 2100 ml  Net -1697 ml      06/21/2024    9:25 PM 06/20/2024    4:45 AM 06/19/2024    5:00 AM  Last 3 Weights  Weight (lbs) 225 lb 239 lb 10.2 oz 240 lb 8.4 oz  Weight (kg) 102.059 kg 108.7 kg 109.1 kg      Telemetry/ECG  Afib rate controlled - Personally Reviewed  Physical Exam  GEN: No acute distress.   Neck: No JVD sitting upright Cardiac: iRRR, no murmurs, rubs, or gallops.  Respiratory: Clear to auscultation bilaterally. GI: Soft, nontender, non-distended  MS: 1+ edema legs wrapped  Assessment & Plan  Effusive constrictive pericarditis - follow ESR/CRP, crp improved, sed rate elevated. Suggests continued inflammation. - continue colchicine  0.6 mg BID. Tolerating well. Continue colchicine  for 3 months or longer until inflammatory markers normalize. - did not tolerate ibuprofen . Stop for now.  - PPI for gastric protection. - she is on warfarin for permanent afib. This is currently held and was reversed for possible procedure. With improving inflammation and improving effusion, will consider starting warfarin prior to discharge, with plan to let INR trend upward. She was reversed so therapeutic INR may take longer. Risk of hemorrhagic conversion certainly present, but hopefully risk  dissipates with improving inflammatory markers. Consider eliquis , though pricing is the barrier per patient and husband.  - will continue diuresis today. No hypotension. Lasix  40 mg IV bid. I ordered a bmet and it has still not been performed. Will reorder now. Need labs to guide diuresis.   Permanent Afib - warfarin plan as above. - continue dilt 360 mg daily.    Pleural effusion - may be secondary to constrictive pericardial physiology. Multiple thoracentesis. Sx improved with fluid removal.    For questions or updates, please contact [facility] Please consult [email or web address] for contact info under         Signed, Soyla DELENA Merck, MD   "

## 2024-06-28 NOTE — Progress Notes (Signed)
 "                                                                                                                                                                                                                                                                                PROGRESS NOTE     Patient Demographics:    [name], is a 84 y.o. female, DOB - Sep 18, 1940, FMW:[id number]  Outpatient Primary MD for the patient is [name], MD    LOS - 7  Admit date - 06/21/2024    Chief Complaint  Patient presents with   Shortness of Breath       Brief Narrative (HPI from H&P)   84 y.o. female with past medical history  of   thyroid  cancer, hyperlipidemia, hypertension, hypothyroid, anxiety, arthritis, OSA on CPAP, and history of sarcoidosis Most recently she was admitted from June 10, 2024 with shock, Staphylococcus epidermidis bacteremia, acute kidney injury, hyperkalemia, and moderate pericardial effusion and discharged June 20, 2024, presented back to ED with shortness of breath.   During recent hospitalization, She was initially admitted to the ICU ,received fresh frozen plasma, vitamin K , lokelma , bicarbonate infusion, and empiric antibiotics. Echocardiography revealed an ejection fraction of 45-50% with a large pericardial effusion without tamponade. Pt had S. epidermidis bacteremia / completed 7 days of IV linezolid .  Patient was seen by cardiology on January 7 for her pericardial effusion with no tamponade on echo with plan for recheck echocardiogram.  Patient was discharged on 5 L of oxygen .  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  Diagnosed with acute hypoxic respiratory failure.  Transferred to my care on 06/23/2024.    Subjective:   Patient in bed, appears comfortable,  denies any headache, no fever, no chest pain or pressure, no shortness of breath , no abdominal pain. No focal weakness.   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  Patient currently on 10 L  nasal cannula oxygen , has been seen by cardiology and pulmonary, left-sided pleural effusion has progressed considerably hence IR requested to do ultrasound-guided therapeutic and diagnostic thoracentesis on 06/23/2024 IR and again on 06/26/2023 and by pulmonary, continue supportive care, CTA was nonacute without any PE.  There is a question of aspiration pneumonia for which she finished antibiotics and cleared by speech.  Continue diuresis as tolerated with IV diuretics, she has undergone ultrasound-guided thoracentesis twice once by IR and once by pulmonary as above fluid suggestive of transudative collection.  Initially she had moderate pericardial effusion but after she was placed on colchicine  her pericardial effusion has significantly improved on repeat echocardiogram on 06/26/2024, clinically appears to be stable.  Continue to monitor appreciate cardiology and pulmonary follow-up.  Defer management of pericardial effusion to cardiology and recurrent pleural effusion likely due to the constrictive pericarditis pulmonary.  For now continue colchicine  and diuretics, clinically she is improving.  NSAIDs added by cardiology on 06/27/2024.    Permanent atrial fibrillation/history of PE: Rate controlled.  Continue Coreg  and Cardizem .  On Coumadin , INR slightly supratherapeutic.  INR now subtherapeutic anticoagulation held per cardiology indefinitely for now.  Lab Results  Component Value Date   INR 1.2 06/28/2024   INR 1.4 (H) 06/27/2024   INR 2.2 (H) 06/26/2024   PROTIME 25.2 (H) 11/29/2014   Hypertension: Blood pressure controlled.  Continue Cardizem , holding  home dose of Avapro .   GERD:   on PPI.   Hypokalemia, hypophosphatemia.  Replaced.    Chronic dysphagia.  Speech following.  Currently on dysphagia 3 diet.    Acquired hypothyroidism: Continue Synthroid .   OSA: Nightly CPAP ordered.   Anemia of chronic disease: Stable.   Obesity.  BMI 38.  Follow-up with PCP.  Type 2 diabetes mellitus:   Not on any medications PTA.  Currently on SSI.  Lab Results  Component Value Date   HGBA1C 7.6 (H) 06/10/2024   CBG (last 3)  Recent Labs    06/27/24 0739 06/27/24 1107 06/27/24 1606  GLUCAP 91 120* 115*        Condition - Extremely Guarded  Family Communication  : Husband [name] [phone]  called on 06/23/2024 at 8:07 AM  Code Status : Full code  Consults  : IR, cardiology, pulmonary  PUD Prophylaxis :     Procedures  :     Repeat echocardiogram 06/26/2024.    1. Small pericardial effusion posterior to the LV. Poorly interrogated on this study. IVC dilated without collapse. Respiraophasic septal shift noted, indicative of increased ventricular interdependence. a small pericardial effusion is present. The pericardial effusion is posterior to the left ventricle. There is no evidence of cardiac tamponade.  2. Left ventricular ejection fraction, by estimation, is 55 to 60%. The left ventricle has normal function. Left ventricular endocardial border not optimally defined to evaluate regional wall motion. Left ventricular diastolic function could not be evaluated.  3. Right ventricular systolic function is mildly reduced. The right ventricular size is moderately enlarged.  4. Left atrial size was severely dilated.  5. Right atrial size was severely dilated.  6. The mitral valve is grossly normal. Trivial mitral valve regurgitation. No evidence of mitral stenosis.  7. The inferior vena cava is dilated in size with <50% respiratory variability, suggesting right atrial pressure of 15 mmHg   Repeat left-sided ultrasound-guided thoracentesis by  pulmonary on 06/25/2024.    Left ultrasound-guided thoracentesis by IR on 06/23/2024.  900 cc fluid removed, chemistries pending  CTA.  1. No  evidence of pulmonary embolism. 2. Small to moderate size right pleural effusion with associated basilar atelectasis. Small left pleural effusion with associated basilar atelectasis. 3. Subtle hazy density over the perihilar region of the left upper lobe possibly due to minimal asymmetric edema and less likely early infection. 4. Mild stable cardiomegaly with interval decrease in pericardial effusion. Atherosclerotic coronary artery disease. 5. Aortic atherosclerosis. 6. Evidence of old granulomatous disease. Aortic Atherosclerosis (ICD10-I70.0).  Cardiac MRI.  1. There is a moderate pericardial effusion. The inferior vena cava is dilated with reduced respiratory collapse, consistent with elevated right sided filling pressures. In diastole, the left ventricle has a D shaped configuration with accentuated respiratory variation in septal position (septal bounce) on strain imaging and with free breathing, indicating ventricular interdependence. This is seen during inspiration and can be seen in constrictive physiology. 2. Normal left ventricular systolic function (LVEF =61%). 3. Decreased right ventricular systolic function (RVEF =38%). 4. There are bilateral pleural effusions with secondary atelectasis  Echocardiogram -  1. Moderate pericardial effusion which is more prominent up to 2.0 cm posterior to the LV (best seen subcostal; image 49). The RV is poorly visualized, but does not appear to collapse in diastole. The IVC is dilated. There is evidence of respiratory variation in septal movement on image 41. Findings are suggestive of a clinically significant pericardial effusion. Clinical correlation is recommended. Appearance is stable compared to prior study. Moderate pericardial effusion. The pericardial effusion  is circumferential.  2. Right ventricular systolic  function was not well visualized. The right ventricular size is not well visualized. Tricuspid regurgitation signal is inadequate for assessing PA pressure.  3. The mitral valve is grossly normal. Trivial mitral valve regurgitation. No evidence of mitral stenosis.  4. The aortic valve is tricuspid. Aortic valve regurgitation is not visualized. Aortic valve sclerosis is present, with no evidence of aortic valve stenosis.  5. The inferior vena cava is dilated in size with <50% respiratory variability, suggesting right atrial pressure of 15 mmHg.  6. Left ventricular ejection fraction, by estimation, is 60 to 65%. The left ventricle has normal function. Left ventricular endocardial border not optimally defined to evaluate regional wall motion. Comparison(s): No significant change from prior study.      Disposition Plan  :    Status is: Inpatient PPI  DVT Prophylaxis  : Coumadin   Lab Results  Component Value Date   INR 1.2 06/28/2024   INR 1.4 (H) 06/27/2024   INR 2.2 (H) 06/26/2024   PROTIME 25.2 (H) 11/29/2014     Lab Results  Component Value Date   PLT 212 06/28/2024    Diet :  Diet Order             DIET DYS 3 Room service appropriate? Yes; Fluid consistency: Thin  Diet effective now                    Inpatient Medications  Scheduled Meds:  colchicine   0.6 mg Oral BID   diltiazem   360 mg Oral Daily   furosemide   40 mg Intravenous BID   heparin  injection (subcutaneous)  5,000 Units Subcutaneous Q8H   ibuprofen   600 mg Oral TID   lactose free nutrition  237 mL Oral BID WC   levothyroxine   112 mcg Oral q morning   pantoprazole   40 mg Oral Daily   potassium chloride   40 mEq Oral Daily   sodium chloride  flush  3 mL Intravenous Q12H   spironolactone   25 mg Oral Daily  Continuous Infusions:   PRN Meds:.acetaminophen  **OR** acetaminophen , alum & mag hydroxide-simeth, artificial tears, guaiFENesin , LORazepam , ondansetron  **OR** ondansetron  (ZOFRAN ) IV, polyethylene  glycol     Objective:   Vitals:   06/28/24 0032 06/28/24 0320 06/28/24 0827 06/28/24 0836  BP: 126/66 124/67 126/62 126/62  Pulse: 81 86 91   Resp: (!) 21 (!) 21 (!) 24   Temp: 97.8 F (36.6 C) (!) 97.5 F (36.4 C) 97.8 F (36.6 C)   TempSrc: Oral Oral Oral   SpO2: 96% 97% 94%   Weight:      Height:        Wt Readings from Last 3 Encounters:  06/21/24 102.1 kg  06/20/24 108.7 kg  05/27/24 109.3 kg     Intake/Output Summary (Last 24 hours) at 06/28/2024 1017 Last data filed at 06/28/2024 0030 Gross per 24 hour  Intake 3 ml  Output 1300 ml  Net -1297 ml     Physical Exam  Awake Alert, No new F.N deficits, in mild respiratory distress [place].AT,PERRAL Supple Neck, No JVD,   Symmetrical Chest wall movement, Good air movement bilaterally, reduced bibasilar breath sounds left more reduced than right RRR,No Gallops,Rubs or new Murmurs,  +ve B.Sounds, Abd Soft, No tenderness,   1+ chronic bilateral lower extremity edema     RN pressure injury documentation: Wound 06/22/24 1541 Pressure Injury Sacrum Medial Stage 2 -  Partial thickness loss of dermis presenting as a shallow open injury with a red, pink wound bed without slough. (Active)      Data Review:    Recent Labs  Lab 06/21/24 1237 06/22/24 0404 06/23/24 0740 06/24/24 0411 06/25/24 0436 06/26/24 0545 06/27/24 0255 06/28/24 0325  WBC 10.9* 10.0 8.4 7.6 7.4 6.5 7.5 6.1  HGB 10.7* 10.1* 10.3* 9.7* 9.2* 9.2* 9.7* 9.9*  HCT 33.8* 32.6* 31.8* 30.2* 28.6* 27.7* 30.0* 31.1*  PLT 175 156 109* 103* 103* 109* 159 212  MCV 93.1 93.4 91.6 92.6 91.7 91.4 92.0 92.3  MCH 29.5 28.9 29.7 29.8 29.5 30.4 29.8 29.4  MCHC 31.7 31.0 32.4 32.1 32.2 33.2 32.3 31.8  RDW 16.4* 16.4* 16.5* 16.5* 16.6* 16.4* 16.5* 16.4*  LYMPHSABS 0.7 0.9 1.0 1.3 1.6  --   --   --   MONOABS 0.9 1.0 1.2* 1.6* 2.1*  --   --   --   EOSABS 0.1 0.1 0.1 0.1 0.1  --   --   --   BASOSABS 0.0 0.0 0.0 0.0 0.0  --   --   --     Recent Labs  Lab  06/21/24 1237 06/21/24 1501 06/21/24 2223 06/22/24 0404 06/23/24 0740 06/24/24 0411 06/25/24 0436 06/25/24 1239 06/26/24 0545 06/27/24 0255 06/28/24 0325  NA 133*  --   --    < > 139 140 141  --  137 138  --   K 4.2  --   --    < > 3.4* 3.4* 3.5  --  3.3* 4.3  --   CL 93*  --   --    < > 91* 93* 93*  --  91* 91*  --   CO2 32  --   --    < > 38* 39* 41*  --  37* 41*  --   ANIONGAP 9  --   --    < > 10 8 6   --  8 5  --   GLUCOSE 134*  --   --    < > 95 86 88  --  98 91  --   BUN 22  --   --    < > 16 17 19   --  16 13  --   CREATININE 0.94  --   --    < > 0.68 0.64 0.68  --  0.68 0.66  --   AST 26  --   --   --  29  --   --   --   --   --   --   ALT 16  --   --   --  19  --   --   --   --   --   --   ALKPHOS 154*  --   --   --  147*  --   --   --   --   --   --   BILITOT 0.6  --   --   --  0.6  --   --   --   --   --   --   ALBUMIN 2.8*  --   --   --  2.8*  --   --  2.7*  --   --   --   CRP  --   --   --   --  7.6*  --   --   --   --   --  2.7*  PROCALCITON  --   --   --   --  <0.10  --   --   --   --   --   --   LATICACIDVEN  --  2.7* 1.8  --   --   --   --   --   --   --   --   INR 2.6*  --   --    < >  --  3.3* 2.8*  --  2.2* 1.4* 1.2  MG  --   --   --    < > 1.7 1.9 2.1  --  1.8 1.8  --   PHOS  --   --   --   --   --  2.1* 2.6  --   --   --   --   CALCIUM  8.3*  --   --    < > 8.4* 8.3* 8.6*  --  8.8* 9.2  --    < > = values in this interval not displayed.      Recent Labs  Lab 06/21/24 1501 06/21/24 2223 06/22/24 0404 06/23/24 0740 06/24/24 0411 06/25/24 0436 06/26/24 0545 06/27/24 0255 06/28/24 0325  CRP  --   --   --  7.6*  --   --   --   --  2.7*  PROCALCITON  --   --   --  <0.10  --   --   --   --   --   LATICACIDVEN 2.7* 1.8  --   --   --   --   --   --   --   INR  --   --    < >  --  3.3* 2.8* 2.2* 1.4* 1.2  MG  --   --    < > 1.7 1.9 2.1 1.8 1.8  --   CALCIUM   --   --    < > 8.4* 8.3* 8.6* 8.8* 9.2  --    < > = values in this interval not displayed.     --------------------------------------------------------------------------------------------------------------- Lab Results  Component Value Date   CHOL 242 (H) 01/12/2023   HDL 62 01/12/2023   LDLCALC 157 (H) 01/12/2023   TRIG 130 01/12/2023   CHOLHDL 3.9 01/12/2023    Lab Results  Component Value Date   HGBA1C 7.6 (H) 06/10/2024   No results for input(s): TSH, T4TOTAL, FREET4, T3FREE, THYROIDAB in the last 72 hours. No results for input(s): VITAMINB12, FOLATE, FERRITIN, TIBC, IRON, RETICCTPCT in the last 72 hours. ------------------------------------------------------------------------------------------------------------------ Cardiac Enzymes No results for input(s): CKMB, TROPONINI, MYOGLOBIN in the last 168 hours.  Invalid input(s): CK  Micro Results Recent Results (from the past 240 hours)  Resp panel by RT-PCR (RSV, Flu A&B, Covid) Anterior Nasal Swab     Status: None   Collection Time: 06/21/24 12:26 PM   Specimen: Anterior Nasal Swab  Result Value Ref Range Status   SARS Coronavirus 2 by RT PCR NEGATIVE NEGATIVE Final   Influenza A by PCR NEGATIVE NEGATIVE Final   Influenza B by PCR NEGATIVE NEGATIVE Final    Comment: (NOTE) The Xpert Xpress SARS-CoV-2/FLU/RSV plus assay is intended as an aid in the diagnosis of influenza from Nasopharyngeal swab specimens and should not be used as a sole basis for treatment. Nasal washings and aspirates are unacceptable for Xpert Xpress SARS-CoV-2/FLU/RSV testing.  Fact Sheet for Patients: bloggercourse.com  Fact Sheet for Healthcare Providers: seriousbroker.it  This test is not yet approved or cleared by the [place]  FDA and has been authorized for detection and/or diagnosis of SARS-CoV-2 by FDA under an Emergency Use Authorization (EUA). This EUA will remain in effect (meaning this test can be used) for the duration of the COVID-19  declaration under Section 564(b)(1) of the Act, 21 U.S.C. section 360bbb-3(b)(1), unless the authorization is terminated or revoked.     Resp Syncytial Virus by PCR NEGATIVE NEGATIVE Final    Comment: (NOTE) Fact Sheet for Patients: bloggercourse.com  Fact Sheet for Healthcare Providers: seriousbroker.it  This test is not yet approved or cleared by the [place]  FDA and has been authorized for detection and/or diagnosis of SARS-CoV-2 by FDA under an Emergency Use Authorization (EUA). This EUA will remain in effect (meaning this test can be used) for the duration of the COVID-19 declaration under Section 564(b)(1) of the Act, 21 U.S.C. section 360bbb-3(b)(1), unless the authorization is terminated or revoked.  Performed at [facility], [address], [place], [place] [address]   Body fluid culture w Gram Stain     Status: None (Preliminary result)   Collection Time: 06/25/24 11:45 AM   Specimen: Pleural Fluid  Result Value Ref Range Status   Specimen Description PLEURAL  Final   Special Requests PLEURAL,LEFT  Final   Gram Stain   Final    RARE WBC PRESENT, PREDOMINANTLY MONONUCLEAR NO ORGANISMS SEEN    Culture   Final    NO GROWTH 3 DAYS Performed at [facility], [address], [place], [place] [address]    Report Status PENDING  Incomplete    Radiology Report ECHOCARDIOGRAM LIMITED Result Date: 06/26/2024    ECHOCARDIOGRAM LIMITED REPORT   Patient Name:   [name] Date of Exam: 06/26/2024 Medical Rec #:  [id number]            Height:       64.0 in Accession #:    [id number]           Weight:       225.0 lb Date of Birth:  09-16-1940  BSA:          2.057 m Patient Age:    83 years             BP:           119/53 mmHg Patient Gender: F                    HR:           75 bpm. Exam Location:  Inpatient Procedure: Limited Echo, Cardiac Doppler and Limited Color Doppler (Both             Spectral and Color Flow Doppler were utilized during procedure). Indications:     Pericardial Effusion I31.3  History:         Patient has prior history of Echocardiogram examinations, most                  recent 06/21/2024. Pericardial Effusion; Risk Factors:Sleep                  Apnea and Hypertension.  Sonographer:     Koleen Popper RDCS Referring Phys:  DARRYLE NED O'NEAL Diagnosing Phys: Darryle Decent MD  Sonographer Comments: Image acquisition challenging due to patient body habitus. IMPRESSIONS  1. Small pericardial effusion posterior to the LV. Poorly interrogated on this study. IVC dilated without collapse. Respiraophasic septal shift noted, indicative of increased ventricular interdependence. a small pericardial effusion is present. The pericardial effusion is posterior to the left ventricle. There is no evidence of cardiac tamponade.  2. Left ventricular ejection fraction, by estimation, is 55 to 60%. The left ventricle has normal function. Left ventricular endocardial border not optimally defined to evaluate regional wall motion. Left ventricular diastolic function could not be evaluated.  3. Right ventricular systolic function is mildly reduced. The right ventricular size is moderately enlarged.  4. Left atrial size was severely dilated.  5. Right atrial size was severely dilated.  6. The mitral valve is grossly normal. Trivial mitral valve regurgitation. No evidence of mitral stenosis.  7. The inferior vena cava is dilated in size with <50% respiratory variability, suggesting right atrial pressure of 15 mmHg. Comparison(s): No significant change from prior study. FINDINGS  Left Ventricle: Left ventricular ejection fraction, by estimation, is 55 to 60%. The left ventricle has normal function. Left ventricular endocardial border not optimally defined to evaluate regional wall motion. The left ventricular internal cavity size was normal in size. There is no left ventricular hypertrophy. Left  ventricular diastolic function could not be evaluated. Left ventricular diastolic function could not be evaluated due to atrial fibrillation. Right Ventricle: The right ventricular size is moderately enlarged. No increase in right ventricular wall thickness. Right ventricular systolic function is mildly reduced. Left Atrium: Left atrial size was severely dilated. Right Atrium: Right atrial size was severely dilated. Pericardium: Small pericardial effusion posterior to the LV. Poorly interrogated on this study. IVC dilated without collapse. Respiraophasic septal shift noted, indicative of increased ventricular interdependence. A small pericardial effusion is present.  The pericardial effusion is posterior to the left ventricle. There is no evidence of cardiac tamponade. Mitral Valve: The mitral valve is grossly normal. Trivial mitral valve regurgitation. No evidence of mitral valve stenosis. Aorta: The aortic root and ascending aorta are structurally normal, with no evidence of dilitation. Venous: The inferior vena cava is dilated in size with less than 50% respiratory variability, suggesting right atrial pressure of 15 mmHg. IAS/Shunts: The atrial septum is grossly normal. Additional Comments:  Spectral Doppler performed. Color Doppler performed.  LEFT VENTRICLE PLAX 2D LVIDd:         4.70 cm LVIDs:         3.50 cm LV PW:         1.00 cm LV IVS:        1.20 cm LVOT diam:     2.00 cm LV SV:         71 LV SV Index:   34 LVOT Area:     3.14 cm  RIGHT VENTRICLE             IVC RV S prime:     11.90 cm/s  IVC diam: 2.50 cm TAPSE (M-mode): 1.3 cm LEFT ATRIUM           Index LA diam:      4.40 cm 2.14 cm/m LA Vol (A2C): 72.0 ml 35.01 ml/m  AORTIC VALVE LVOT Vmax:   133.00 cm/s LVOT Vmean:  86.800 cm/s LVOT VTI:    0.225 m  AORTA Ao Root diam: 2.80 cm Ao Asc diam:  3.40 cm MITRAL VALVE MV Area (PHT): 5.66 cm     SHUNTS MV Decel Time: 134 msec     Systemic VTI:  0.22 m MR Peak grad: 62.4 mmHg     Systemic Diam: 2.00 cm MR  Vmax:      395.00 cm/s MV E velocity: 138.00 cm/s MV A velocity: 36.90 cm/s MV E/A ratio:  3.74 Darryle Decent MD Electronically signed by Darryle Decent MD Signature Date/Time: 06/26/2024/3:15:19 PM    Final (Updated)      Signature  -   Lavada Stank M.D on 06/28/2024 at 10:17 AM   -  To page go to [email or web address]   "

## 2024-06-28 NOTE — Progress Notes (Signed)
 Physical Therapy Treatment Patient Details Name: [name] MRN: [id number] DOB: 11/22/1940 Today's Date: 06/28/2024   History of Present Illness Pt is 84 year old presented to [facility] on  06/21/24 for SOB. Was just discharged from [facility] to [facility] on 06/20/24. At [facility] pt with bacteremia and pericardial effusion. Pt now with acute hypoxic respiratory failure, pleural effusion, and acute on chronic CHF.  PMH - CHF, HTN, arthritis, anxiety, thyroid  CA, afib, PE, DM, OSA    PT Comments  Pt received in supine and agreeable to session. Pt able to perform bed mobility, transfers, and short gait trial with up to CGA for safety. Pt on 4L upon entry and able to decrease to 2L during mobility with SpO2 >90%. Pt limited by weakness and shortness of breath during ambulation requiring a seated rest break. Pt able to stand and perform additional standing marches. Pt continues to benefit from PT services to progress toward functional mobility goals.     If plan is discharge home, recommend the following: A little help with walking and/or transfers;A lot of help with bathing/dressing/bathroom;Assistance with cooking/housework;Assist for transportation;Help with stairs or ramp for entrance   Can travel by private vehicle     No  Equipment Recommendations  None recommended by PT (Pt with appropriate equipment at home)    Recommendations for Other Services       Precautions / Restrictions Precautions Precautions: Fall;Other (comment) Recall of Precautions/Restrictions: Intact Precaution/Restrictions Comments: monitor O2 Restrictions Weight Bearing Restrictions Per Provider Order: No     Mobility  Bed Mobility Overal bed mobility: Needs Assistance Bed Mobility: Supine to Sit     Supine to sit: Supervision, HOB elevated, Used rails          Transfers Overall transfer level: Needs assistance Equipment used: Rolling walker (2 wheels) Transfers: Sit to/from Stand Sit to Stand: Contact  guard assist           General transfer comment: from low EOB to RW    Ambulation/Gait Ambulation/Gait assistance: Contact guard assist Gait Distance (Feet): 40 Feet Assistive device: Rolling walker (2 wheels) Gait Pattern/deviations: Step-through pattern, Decreased stride length, Narrow base of support, Trunk flexed       General Gait Details: slow gait with reliance on RW support. SpO2 WFL on 2L during ambulation, but poor pleth until resting. assist for line management   Stairs             Wheelchair Mobility     Tilt Bed    Modified Rankin (Stroke Patients Only)       Balance Overall balance assessment: Needs assistance Sitting-balance support: No upper extremity supported, Feet supported Sitting balance-Leahy Scale: Fair Sitting balance - Comments: EOB   Standing balance support: Bilateral upper extremity supported, During functional activity, Reliant on assistive device for balance Standing balance-Leahy Scale: Poor Standing balance comment: Dependent on RW                            Communication Communication Communication: Impaired Factors Affecting Communication: Hearing impaired  Cognition Arousal: Alert Behavior During Therapy: WFL for tasks assessed/performed   PT - Cognitive impairments: No apparent impairments                         Following commands: Intact      Cueing Cueing Techniques: Verbal cues  Exercises General Exercises - Lower Extremity Hip Flexion/Marching: AROM, Standing, Both, 10 reps  General Comments General comments (skin integrity, edema, etc.): Pt on 4L upon entry with SpO2 94%. Able to titrate down to 2L during ambulation, but poor pleth during mobility. SpO2 93-96% on 2L. Trial of RA, but SpO2 down to 86% at rest, so put back on 1L with SpO2 improving to 90%. Pt left on 2L with SpO2 93%, RN notified.      Pertinent Vitals/Pain Pain Assessment Pain Assessment: Faces Faces Pain Scale:  Hurts a little bit Pain Location: Bottom Pain Descriptors / Indicators: Aching, Sore Pain Intervention(s): Limited activity within patient's tolerance, Monitored during session, Repositioned     PT Goals (current goals can now be found in the care plan section) Acute Rehab PT Goals Patient Stated Goal: return home PT Goal Formulation: With patient/family Time For Goal Achievement: 07/06/24 Progress towards PT goals: Progressing toward goals    Frequency    Min 2X/week       AM-PAC PT 6 Clicks Mobility   Outcome Measure  Help needed turning from your back to your side while in a flat bed without using bedrails?: None Help needed moving from lying on your back to sitting on the side of a flat bed without using bedrails?: A Little Help needed moving to and from a bed to a chair (including a wheelchair)?: A Little Help needed standing up from a chair using your arms (e.g., wheelchair or bedside chair)?: A Little Help needed to walk in hospital room?: A Little Help needed climbing 3-5 steps with a railing? : Total 6 Click Score: 17    End of Session Equipment Utilized During Treatment: Gait belt;Oxygen  Activity Tolerance: Patient limited by fatigue;Patient tolerated treatment well Patient left: in chair;with call bell/phone within reach;with family/visitor present Nurse Communication: Mobility status PT Visit Diagnosis: Unsteadiness on feet (R26.81);Muscle weakness (generalized) (M62.81)     Time: 8963-8892 PT Time Calculation (min) (ACUTE ONLY): 31 min  Charges:    $Gait Training: 8-22 mins $Therapeutic Activity: 8-22 mins PT General Charges $$ ACUTE PT VISIT: 1 Visit                    Darryle George, PTA Acute Rehabilitation Services Secure Chat Preferred  [phone]    Darryle George 06/28/2024, 11:29 AM

## 2024-06-29 ENCOUNTER — Inpatient Hospital Stay (HOSPITAL_COMMUNITY)

## 2024-06-29 DIAGNOSIS — R0602 Shortness of breath: Secondary | ICD-10-CM | POA: Diagnosis not present

## 2024-06-29 LAB — BASIC METABOLIC PANEL WITH GFR
Anion gap: 5 (ref 5–15)
BUN: 16 mg/dL (ref 8–23)
CO2: 39 mmol/L — ABNORMAL HIGH (ref 22–32)
Calcium: 8.9 mg/dL (ref 8.9–10.3)
Chloride: 92 mmol/L — ABNORMAL LOW (ref 98–111)
Creatinine, Ser: 0.91 mg/dL (ref 0.44–1.00)
GFR, Estimated: >60 mL/min
Glucose, Bld: 96 mg/dL (ref 70–99)
Potassium: 4 mmol/L (ref 3.5–5.1)
Sodium: 136 mmol/L (ref 135–145)

## 2024-06-29 LAB — CBC
HCT: 32.1 % — ABNORMAL LOW (ref 36.0–46.0)
Hemoglobin: 10 g/dL — ABNORMAL LOW (ref 12.0–15.0)
MCH: 29 pg (ref 26.0–34.0)
MCHC: 31.2 g/dL (ref 30.0–36.0)
MCV: 93 fL (ref 80.0–100.0)
Platelets: 254 K/uL (ref 150–400)
RBC: 3.45 MIL/uL — ABNORMAL LOW (ref 3.87–5.11)
RDW: 16.5 % — ABNORMAL HIGH (ref 11.5–15.5)
WBC: 6 K/uL (ref 4.0–10.5)
nRBC: 0 % (ref 0.0–0.2)

## 2024-06-29 LAB — PHOSPHORUS: Phosphorus: 3.5 mg/dL (ref 2.5–4.6)

## 2024-06-29 LAB — MAGNESIUM: Magnesium: 1.9 mg/dL (ref 1.7–2.4)

## 2024-06-29 LAB — CYTOLOGY - NON PAP

## 2024-06-29 MED ORDER — GERHARDT'S BUTT CREAM
TOPICAL_CREAM | Freq: Two times a day (BID) | CUTANEOUS | Status: DC
  Administered 2024-06-30: 1 via TOPICAL
  Filled 2024-06-29: qty 60

## 2024-06-29 NOTE — Progress Notes (Signed)
"  °  Progress Note  Patient Name: [name] Date of Encounter: 06/29/2024 [facility] Cardiologist: Georganna Archer, MD   Interval Summary   Appears a little better today, no new concerns.  Vital Signs Vitals:   06/28/24 2053 06/28/24 2200 06/29/24 0000 06/29/24 0400  BP: 126/74  113/67 137/84  Pulse: 87 (!) 102 96 97  Resp: (!) 21 (!) 24 (!) 21 (!) 21  Temp: 98.1 F (36.7 C)  98.3 F (36.8 C)   TempSrc: Oral  Oral   SpO2: 95% 95% 90% 91%  Weight:      Height:        Intake/Output Summary (Last 24 hours) at 06/29/2024 0855 Last data filed at 06/29/2024 0600 Gross per 24 hour  Intake 403 ml  Output 1900 ml  Net -1497 ml      06/21/2024    9:25 PM 06/20/2024    4:45 AM 06/19/2024    5:00 AM  Last 3 Weights  Weight (lbs) 225 lb 239 lb 10.2 oz 240 lb 8.4 oz  Weight (kg) 102.059 kg 108.7 kg 109.1 kg      Telemetry/ECG  Afib rate controlled - Personally Reviewed  Physical Exam  GEN: No acute distress.   Neck: No JVD sitting upright Cardiac: iRRR, no murmurs, rubs, or gallops.  Respiratory: Clear to auscultation bilaterally. GI: Soft, nontender, non-distended  MS: 1+ edema legs wrapped  Assessment & Plan  Effusive constrictive pericarditis - follow ESR/CRP, crp improved, sed rate elevated. Suggests continued inflammation. - continue colchicine  0.6 mg BID. Tolerating well. Continue colchicine  for 3 months or longer until inflammatory markers normalize. - did not tolerate ibuprofen  due to headache. Stop for now.  - PPI for gastric protection. - she is on warfarin for permanent afib. This is currently held and was reversed for possible procedure. With improving inflammation and improving effusion, will consider starting warfarin prior to discharge, with plan to let INR trend upward. She was reversed so therapeutic INR may take longer. Risk of hemorrhagic conversion certainly present, but hopefully risk dissipates with improving inflammatory markers.  Consider eliquis , though pricing is the barrier per patient and husband.  - will continue diuresis today. No hypotension. Lasix  40 mg IV bid. Labs stable.   Permanent Afib - warfarin plan as above. Eliquis  cost prohibitive for patient. - continue dilt 360 mg daily.  -repeat INR tomorrow and consider resuming warfarin on day of discharge. Will not bridge, can allow pt to drift back to therapeutic INR. Pt and husband understand risk of hemorrhagic conversion of effusion balanced with risk of stroke off AC.   Pleural effusion - may be secondary to constrictive pericardial physiology. Multiple thoracentesis. Sx improved with fluid removal.  - consider repeat CXR tomorrow or Friday given continued o2 req.   For questions or updates, please contact [facility] Please consult [email or web address] for contact info under         Signed, Soyla DELENA Merck, MD   "

## 2024-06-29 NOTE — Consult Note (Signed)
" °  CLINICAL SUPPORT TEAM - WOUND OSTOMY AND CONTINENCE TEAM  CONSULTATION SERVICES   WOC Nurse-Inpatient Note   WOC Nurse Consult Note: Reason for Consult: Bilateral lower extremities with edema, nonhealing full thickness lesions in the gaiter area.   Pericardial effusion, respiratory failure, being diuresed.  Moisture associated skin damage to perineal skin, primarily buttocks.   Wound type: Moisture and inflammatory Pressure Injury POA: NA Measurement: 10 cm x 6 cm x 0.1 cm bilateral buttocks with peeling epithelium noted.   Bilateral lower legs with scattered nonintact lesions circumferentially around malleolus Wound azi:epwx and moist Drainage (amount, consistency, odor) scant weeping  Periwound: frequently moist  female external catheter in place Dressing procedure/placement/frequency: Cleanse wounds to bilateral lower legs with VASHE and air dry.  Apply Aquacel to open wounds and cover with gauze.  Wrap legs with kerlix from below toes to below knee and secure with ace wraps.  Change Monday/Wednesday/Friday.  Cleanse perineal and buttocks areas with VASHE and air dry.  Apply Gerhardts butts paste twice daily.  No disposable underpads or briefs. Due to obese body habitus and incontinence, recommend low air loss mattress to improve skin microclimate.  Will not follow at this time.  Please re-consult if needed.  Darice Cooley MSN, RN, FNP-BC CWON Wound, Ostomy, Continence Nurse Outpatient [facility] [phone] Work cell phone:  [phone]       "

## 2024-06-29 NOTE — Plan of Care (Signed)

## 2024-06-29 NOTE — TOC Progression Note (Signed)
 Transition of Care [facility]) - Progression Note    Patient Details  Name: [name] MRN: [id number] Date of Birth: [birth year]  [facility] CM/SW Contact  Inocente GORMAN Kindle, LCSW Phone Number: 06/29/2024, 5:55 PM  Clinical Narrative:    CSW met with patient and spouse who report agreement to return to [facility] for rehab at discharge. [facility] hopeful to have a bed open on Friday if stable.   CSW submitted and received insurance approval, Certification # [id number], effective until 07/06/24.    Expected Discharge Plan: Skilled Nursing Facility Barriers to Discharge: English As A Second Language Teacher, Continued Medical Work up               Expected Discharge Plan and Services In-house Referral: Clinical Social Work   Post Acute Care Choice: Skilled Nursing Facility Living arrangements for the past 2 months: Single Family Home                                       Social Drivers of Health (SDOH) Interventions SDOH Screenings   Food Insecurity: No Food Insecurity (06/22/2024)  Housing: Low Risk (06/22/2024)  Transportation Needs: No Transportation Needs (06/22/2024)  Utilities: Not At Risk (06/22/2024)  Depression (PHQ2-9): Low Risk (05/27/2024)  Social Connections: Moderately Integrated (06/22/2024)  Tobacco Use: Low Risk (06/21/2024)    Readmission Risk Interventions    06/13/2024   11:12 AM 09/19/2022    2:13 PM  Readmission Risk Prevention Plan  Post Dischage Appt  Complete  Medication Screening  Complete  Transportation Screening Complete   PCP or Specialist Appt within 3-5 Days Complete   HRI or Home Care Consult Complete   Social Work Consult for Recovery Care Planning/Counseling Complete   Palliative Care Screening Not Applicable   Medication Review Oceanographer) Complete

## 2024-06-29 NOTE — Progress Notes (Signed)
 "                                                                                                                                                                                                                                                                                PROGRESS NOTE     Patient Demographics:    [name], is a 84 y.o. female, DOB - [birth year], FMW:[id number]  Outpatient Primary MD for the patient is [name] MATSU, MD    LOS - 8  Admit date - 06/21/2024    Chief Complaint  Patient presents with   Shortness of Breath       Brief Narrative (HPI from H&P)    84 y.o. female with past medical history  of   thyroid  cancer, hyperlipidemia, hypertension, hypothyroid, anxiety, arthritis, OSA on CPAP, and history of sarcoidosis Most recently she was admitted from June 10, 2024 with shock, Staphylococcus epidermidis bacteremia, acute kidney injury, hyperkalemia, and moderate pericardial effusion and discharged June 20, 2024, presented back to ED with shortness of breath.   During recent hospitalization, She was initially admitted to the ICU ,received fresh frozen plasma, vitamin K , lokelma , bicarbonate infusion, and empiric antibiotics. Echocardiography revealed an ejection fraction of 45-50% with a large pericardial effusion without tamponade. Pt had S. epidermidis bacteremia / completed 7 days of IV linezolid .  Patient was seen by cardiology on January 7 for her pericardial effusion with no tamponade on echo with plan for recheck echocardiogram.  Patient was discharged on 5 L of oxygen .  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  Diagnosed with acute hypoxic respiratory failure.  Transferred to my care on 06/23/2024.    Subjective:   Patient in bed, reports lower back pain  from laying on the bed, asking to get to chair  Assessment  & Plan :   Pericardial effusion with constrictive pericarditis  Large left pleural effusion  Acute on chronic hypoxic respiratory failure  - Initially on 10 L oxygen , weaning of oxygen , she is on 2 L oxygen  today . -  Encouraged use incentive spirometry . - CTA nonacute without any PE . - s/p postthoracentesis 1/15, 1/17.  - Elevated ESR, CRP, improving. - Continue with colchicine , continue for 5-month or longer until inflammatory markers normalize. - Hold NSAIDs as did not help much-on PPI for GI protection. - On IV diuresis, manage per cardiology, pattern remained stable. - Currently anticoagulation on hold, but likely will start on warfarin on Friday and allowed to trend up gradually, without bridge to avoid hemorrhagic conversion. - By reviewing CT chest 06/23/2024, it does appear she does have significant right pleural effusion, will repeat imaging this afternoon to see if it improves with diuresis, if not we will request right pleural effusion thoracentesis.   Permanent atrial fibrillation/history of PE: Rate controlled.   -please see above regarding anticoagulation with warfarin  Continue with diltiazem  360 mg daily Lab Results  Component Value Date   INR 1.2 06/28/2024   INR 1.4 (H) 06/27/2024   INR 2.2 (H) 06/26/2024   PROTIME 25.2 (H) 11/29/2014   Hypertension: Blood pressure controlled.  Continue Cardizem , holding home dose of Avapro .   GERD:   on PPI.   Hypokalemia, hypophosphatemia.  Replaced.    Chronic dysphagia.  Speech following.  Currently on dysphagia 3 diet.    Acquired hypothyroidism: Continue Synthroid .   OSA: Nightly CPAP ordered.   Anemia of chronic disease: Stable.   Obesity.  BMI 38.  Follow-up with PCP.  Type 2 diabetes mellitus:   Not on any medications PTA.  Currently on SSI.  Lab Results  Component Value Date   HGBA1C 7.6 (H) 06/10/2024   CBG (last 3)  Recent Labs     06/27/24 0739 06/27/24 1107 06/27/24 1606  GLUCAP 91 120* 115*        Condition - Extremely Guarded  Family Communication  : Discussed with husband at bedside  Code Status : Full code  Consults  : IR, cardiology, pulmonary  PUD Prophylaxis :     Procedures  :     Repeat echocardiogram 06/26/2024.    1. Small pericardial effusion posterior to the LV. Poorly interrogated on this study. IVC dilated without collapse. Respiraophasic septal shift noted, indicative of increased ventricular interdependence. a small pericardial effusion is present. The pericardial effusion is posterior to the left ventricle. There is no evidence of cardiac tamponade.  2. Left ventricular ejection fraction, by estimation, is 55 to 60%. The left ventricle has normal function. Left ventricular endocardial border not optimally defined to evaluate regional wall motion. Left ventricular diastolic function could not be evaluated.  3. Right ventricular systolic function is mildly reduced. The right ventricular size is moderately enlarged.  4. Left atrial size was severely dilated.  5. Right atrial size was severely dilated.  6. The mitral valve is grossly normal. Trivial mitral valve regurgitation. No evidence of mitral stenosis.  7. The inferior vena cava is dilated in size with <50% respiratory variability, suggesting right atrial pressure of 15 mmHg   Repeat left-sided ultrasound-guided thoracentesis by pulmonary on 06/25/2024.    Left ultrasound-guided thoracentesis by IR on 06/23/2024.  900 cc fluid removed, chemistries pending  CTA.  1. No evidence of pulmonary embolism. 2. Small to moderate size right pleural effusion with associated basilar atelectasis. Small left pleural effusion with associated basilar atelectasis. 3. Subtle hazy density over the perihilar region of the left upper lobe possibly due to minimal asymmetric edema and less likely early infection. 4. Mild stable cardiomegaly with interval decrease in  pericardial effusion. Atherosclerotic  coronary artery disease. 5. Aortic atherosclerosis. 6. Evidence of old granulomatous disease. Aortic Atherosclerosis (ICD10-I70.0).  Cardiac MRI.  1. There is a moderate pericardial effusion. The inferior vena cava is dilated with reduced respiratory collapse, consistent with elevated right sided filling pressures. In diastole, the left ventricle has a D shaped configuration with accentuated respiratory variation in septal position (septal bounce) on strain imaging and with free breathing, indicating ventricular interdependence. This is seen during inspiration and can be seen in constrictive physiology. 2. Normal left ventricular systolic function (LVEF =61%). 3. Decreased right ventricular systolic function (RVEF =38%). 4. There are bilateral pleural effusions with secondary atelectasis  Echocardiogram -  1. Moderate pericardial effusion which is more prominent up to 2.0 cm posterior to the LV (best seen subcostal; image 49). The RV is poorly visualized, but does not appear to collapse in diastole. The IVC is dilated. There is evidence of respiratory variation in septal movement on image 41. Findings are suggestive of a clinically significant pericardial effusion. Clinical correlation is recommended. Appearance is stable compared to prior study. Moderate pericardial effusion. The pericardial effusion  is circumferential.  2. Right ventricular systolic function was not well visualized. The right ventricular size is not well visualized. Tricuspid regurgitation signal is inadequate for assessing PA pressure.  3. The mitral valve is grossly normal. Trivial mitral valve regurgitation. No evidence of mitral stenosis.  4. The aortic valve is tricuspid. Aortic valve regurgitation is not visualized. Aortic valve sclerosis is present, with no evidence of aortic valve stenosis.  5. The inferior vena cava is dilated in size with <50% respiratory variability, suggesting right atrial  pressure of 15 mmHg.  6. Left ventricular ejection fraction, by estimation, is 60 to 65%. The left ventricle has normal function. Left ventricular endocardial border not optimally defined to evaluate regional wall motion. Comparison(s): No significant change from prior study.      Disposition Plan  :    Status is: Inpatient PPI  DVT Prophylaxis  : Coumadin   Lab Results  Component Value Date   INR 1.2 06/28/2024   INR 1.4 (H) 06/27/2024   INR 2.2 (H) 06/26/2024   PROTIME 25.2 (H) 11/29/2014     Lab Results  Component Value Date   PLT 254 06/29/2024    Diet :  Diet Order             DIET DYS 3 Room service appropriate? Yes; Fluid consistency: Thin  Diet effective now                    Inpatient Medications  Scheduled Meds:  colchicine   0.6 mg Oral BID   diltiazem   360 mg Oral Daily   furosemide   40 mg Intravenous BID   heparin  injection (subcutaneous)  5,000 Units Subcutaneous Q8H   lactose free nutrition  237 mL Oral BID WC   levothyroxine   112 mcg Oral q morning   pantoprazole   40 mg Oral Daily   potassium chloride   40 mEq Oral Daily   sodium chloride  flush  3 mL Intravenous Q12H   spironolactone   25 mg Oral Daily   Continuous Infusions:   PRN Meds:.acetaminophen  **OR** acetaminophen , alum & mag hydroxide-simeth, artificial tears, guaiFENesin , LORazepam , ondansetron  **OR** ondansetron  (ZOFRAN ) IV, polyethylene glycol     Objective:   Vitals:   06/29/24 0000 06/29/24 0400 06/29/24 0800 06/29/24 1245  BP: 113/67 137/84 121/75 123/75  Pulse: 96 97 92 99  Resp: (!) 21 (!) 21 (!) 21 20  Temp: 98.3  F (36.8 C)  98 F (36.7 C) 97.9 F (36.6 C)  TempSrc: Oral  Oral   SpO2: 90% 91% 96% 95%  Weight:      Height:        Wt Readings from Last 3 Encounters:  06/21/24 102.1 kg  06/20/24 108.7 kg  05/27/24 109.3 kg     Intake/Output Summary (Last 24 hours) at 06/29/2024 1509 Last data filed at 06/29/2024 0944 Gross per 24 hour  Intake 3 ml   Output 1400 ml  Net -1397 ml     Physical Exam  Awake Alert, Oriented X 3, No new F.N deficits, Normal affect Diminished air entry at the bases Irregular +ve B.Sounds, Abd Soft, No tenderness, No rebound - guarding or rigidity. No Cyanosis, Clubbing or edema, No new Rash or bruise       RN pressure injury documentation: Wound 06/22/24 1541 Pressure Injury Sacrum Medial Stage 2 -  Partial thickness loss of dermis presenting as a shallow open injury with a red, pink wound bed without slough. (Active)      Data Review:    Recent Labs  Lab 06/23/24 0740 06/24/24 0411 06/25/24 0436 06/26/24 0545 06/27/24 0255 06/28/24 0325 06/29/24 0344  WBC 8.4 7.6 7.4 6.5 7.5 6.1 6.0  HGB 10.3* 9.7* 9.2* 9.2* 9.7* 9.9* 10.0*  HCT 31.8* 30.2* 28.6* 27.7* 30.0* 31.1* 32.1*  PLT 109* 103* 103* 109* 159 212 254  MCV 91.6 92.6 91.7 91.4 92.0 92.3 93.0  MCH 29.7 29.8 29.5 30.4 29.8 29.4 29.0  MCHC 32.4 32.1 32.2 33.2 32.3 31.8 31.2  RDW 16.5* 16.5* 16.6* 16.4* 16.5* 16.4* 16.5*  LYMPHSABS 1.0 1.3 1.6  --   --   --   --   MONOABS 1.2* 1.6* 2.1*  --   --   --   --   EOSABS 0.1 0.1 0.1  --   --   --   --   BASOSABS 0.0 0.0 0.0  --   --   --   --     Recent Labs  Lab 06/23/24 0740 06/24/24 0411 06/25/24 0436 06/25/24 1239 06/26/24 0545 06/27/24 0255 06/28/24 0325 06/28/24 1441 06/29/24 0344  NA 139 140 141  --  137 138  --  136 136  K 3.4* 3.4* 3.5  --  3.3* 4.3  --  4.7 4.0  CL 91* 93* 93*  --  91* 91*  --  93* 92*  CO2 38* 39* 41*  --  37* 41*  --  33* 39*  ANIONGAP 10 8 6   --  8 5  --  9 5  GLUCOSE 95 86 88  --  98 91  --  122* 96  BUN 16 17 19   --  16 13  --  15 16  CREATININE 0.68 0.64 0.68  --  0.68 0.66  --  0.96 0.91  AST 29  --   --   --   --   --   --   --   --   ALT 19  --   --   --   --   --   --   --   --   ALKPHOS 147*  --   --   --   --   --   --   --   --   BILITOT 0.6  --   --   --   --   --   --   --   --  ALBUMIN 2.8*  --   --  2.7*  --   --   --   --   --    CRP 7.6*  --   --   --   --   --  2.7*  --   --   PROCALCITON <0.10  --   --   --   --   --   --   --   --   INR  --  3.3* 2.8*  --  2.2* 1.4* 1.2  --   --   MG 1.7 1.9 2.1  --  1.8 1.8  --   --  1.9  PHOS  --  2.1* 2.6  --   --   --   --   --  3.5  CALCIUM  8.4* 8.3* 8.6*  --  8.8* 9.2  --  9.2 8.9      Recent Labs  Lab 06/23/24 0740 06/24/24 0411 06/25/24 0436 06/26/24 0545 06/27/24 0255 06/28/24 0325 06/28/24 1441 06/29/24 0344  CRP 7.6*  --   --   --   --  2.7*  --   --   PROCALCITON <0.10  --   --   --   --   --   --   --   INR  --  3.3* 2.8* 2.2* 1.4* 1.2  --   --   MG 1.7 1.9 2.1 1.8 1.8  --   --  1.9  CALCIUM  8.4* 8.3* 8.6* 8.8* 9.2  --  9.2 8.9    --------------------------------------------------------------------------------------------------------------- Lab Results  Component Value Date   CHOL 242 (H) 01/12/2023   HDL 62 01/12/2023   LDLCALC 157 (H) 01/12/2023   TRIG 130 01/12/2023   CHOLHDL 3.9 01/12/2023    Lab Results  Component Value Date   HGBA1C 7.6 (H) 06/10/2024   No results for input(s): TSH, T4TOTAL, FREET4, T3FREE, THYROIDAB in the last 72 hours. No results for input(s): VITAMINB12, FOLATE, FERRITIN, TIBC, IRON, RETICCTPCT in the last 72 hours. ------------------------------------------------------------------------------------------------------------------ Cardiac Enzymes No results for input(s): CKMB, TROPONINI, MYOGLOBIN in the last 168 hours.  Invalid input(s): CK  Micro Results Recent Results (from the past 240 hours)  Resp panel by RT-PCR (RSV, Flu A&B, Covid) Anterior Nasal Swab     Status: None   Collection Time: 06/21/24 12:26 PM   Specimen: Anterior Nasal Swab  Result Value Ref Range Status   SARS Coronavirus 2 by RT PCR NEGATIVE NEGATIVE Final   Influenza A by PCR NEGATIVE NEGATIVE Final   Influenza B by PCR NEGATIVE NEGATIVE Final    Comment: (NOTE) The Xpert Xpress SARS-CoV-2/FLU/RSV plus  assay is intended as an aid in the diagnosis of influenza from Nasopharyngeal swab specimens and should not be used as a sole basis for treatment. Nasal washings and aspirates are unacceptable for Xpert Xpress SARS-CoV-2/FLU/RSV testing.  Fact Sheet for Patients: bloggercourse.com  Fact Sheet for Healthcare Providers: seriousbroker.it  This test is not yet approved or cleared by the [place]  FDA and has been authorized for detection and/or diagnosis of SARS-CoV-2 by FDA under an Emergency Use Authorization (EUA). This EUA will remain in effect (meaning this test can be used) for the duration of the COVID-19 declaration under Section 564(b)(1) of the Act, 21 U.S.C. section 360bbb-3(b)(1), unless the authorization is terminated or revoked.     Resp Syncytial Virus by PCR NEGATIVE NEGATIVE Final    Comment: (NOTE) Fact Sheet for Patients: bloggercourse.com  Fact Sheet for Healthcare Providers: seriousbroker.it  This test is not yet approved or cleared by the [place]  FDA and has been authorized for detection and/or diagnosis of SARS-CoV-2 by FDA under an Emergency Use Authorization (EUA). This EUA will remain in effect (meaning this test can be used) for the duration of the COVID-19 declaration under Section 564(b)(1) of the Act, 21 U.S.C. section 360bbb-3(b)(1), unless the authorization is terminated or revoked.  Performed at [facility], [address], [place], [place] [address]   Body fluid culture w Gram Stain     Status: None   Collection Time: 06/25/24 11:45 AM   Specimen: Pleural Fluid  Result Value Ref Range Status   Specimen Description PLEURAL  Final   Special Requests PLEURAL,LEFT  Final   Gram Stain   Final    RARE WBC PRESENT, PREDOMINANTLY MONONUCLEAR NO ORGANISMS SEEN    Culture   Final    NO GROWTH 3 DAYS Performed at [facility]  Lab, [address] [place], [place] [address]    Report Status 06/28/2024 FINAL  Final    Radiology Report No results found.    Signature  -   Brayton Lye M.D on 06/29/2024 at 3:09 PM   -  To page go to [email or web address]   "

## 2024-06-29 NOTE — Progress Notes (Signed)
 Occupational Therapy Treatment Patient Details Name: [name] MRN: [id number] DOB: [birth year] Today's Date: 06/29/2024   History of present illness Pt is 84 year old presented to [facility] on  06/21/24 for SOB. Was just discharged from [facility] to [facility] on 06/20/24. At [facility] pt with bacteremia and pericardial effusion. Pt now with acute hypoxic respiratory failure, pleural effusion, and acute on chronic CHF.  PMH - CHF, HTN, arthritis, anxiety, thyroid  CA, afib, PE, DM, OSA   OT comments  Pt progressing well towards OT goals. Focus of session on progressing functional mobility and increasing independence with ADL tasks OOB. Pt continues to require increase O2 support during functional mobility, otherwise CGA with RW. Required Max A for ADL engagement this session. OT to continue to follow Pt acutely, continue per POC.       If plan is discharge home, recommend the following:  A little help with walking and/or transfers;A lot of help with bathing/dressing/bathroom;Assistance with cooking/housework;Direct supervision/assist for medications management;Assist for transportation;Help with stairs or ramp for entrance   Equipment Recommendations  Other (comment) (defer)    Recommendations for Other Services      Precautions / Restrictions Precautions Precautions: Fall;Other (comment) Recall of Precautions/Restrictions: Intact Precaution/Restrictions Comments: monitor O2 Restrictions Weight Bearing Restrictions Per Provider Order: No       Mobility Bed Mobility Overal bed mobility: Needs Assistance Bed Mobility: Supine to Sit     Supine to sit: Supervision     General bed mobility comments: Supervision for safety and increased time to come to EOB. Assistance for management of lines/leads.    Transfers Overall transfer level: Needs assistance Equipment used: Rolling walker (2 wheels) Transfers: Sit to/from Stand, Bed to chair/wheelchair/BSC Sit to Stand: Contact guard  assist     Step pivot transfers: Contact guard assist     General transfer comment: CGA for functional transfers with RW this date. Pt with good insight into deficits, noted when she was feeling weak and requested to take a seat. Pt ambualted ~15 ft prior to rest break in recliner. Pt initially on 2L O2, increased to 3L during ambulation. Returned to 2L following mobility with assistance in PLB.     Balance Overall balance assessment: Needs assistance Sitting-balance support: No upper extremity supported, Feet supported Sitting balance-Leahy Scale: Good Sitting balance - Comments: EOB   Standing balance support: Bilateral upper extremity supported, During functional activity, Reliant on assistive device for balance Standing balance-Leahy Scale: Poor Standing balance comment: Dependent on RW                           ADL either performed or assessed with clinical judgement   ADL Overall ADL's : Needs assistance/impaired                 Upper Body Dressing : Minimal assistance   Lower Body Dressing: Maximal assistance;Bed level       Toileting- Clothing Manipulation and Hygiene: Total assistance;Maximal assistance Toileting - Clothing Manipulation Details (indicate cue type and reason): Pt requires total A for toileting management, purewick in place but not catching all urine. Pt able to place washcloth between legs in standing with Min A.            Extremity/Trunk Assessment Upper Extremity Assessment Upper Extremity Assessment: Overall WFL for tasks assessed            Vision       Perception     Praxis  Communication Communication Communication: Impaired Factors Affecting Communication: Hearing impaired   Cognition Arousal: Alert Behavior During Therapy: WFL for tasks assessed/performed Cognition: No apparent impairments                               Following commands: Intact        Cueing   Cueing Techniques:  Verbal cues  Exercises      Shoulder Instructions       General Comments Pt initially on 2L O2, increased to 3L during ambulation. Returned to 2L following mobility with assistance in PLB.    Pertinent Vitals/ Pain       Pain Assessment Pain Assessment: Faces Faces Pain Scale: Hurts a little bit Pain Location: Bottom Pain Descriptors / Indicators: Discomfort, Sore Pain Intervention(s): Limited activity within patient's tolerance, Monitored during session, Repositioned  Home Living                                          Prior Functioning/Environment              Frequency  Min 2X/week        Progress Toward Goals  OT Goals(current goals can now be found in the care plan section)  Progress towards OT goals: Progressing toward goals  Acute Rehab OT Goals Patient Stated Goal: to manage urination OT Goal Formulation: With patient Time For Goal Achievement: 07/06/24 Potential to Achieve Goals: Good  Plan      Co-evaluation                 AM-PAC OT 6 Clicks Daily Activity     Outcome Measure   Help from another person eating meals?: None Help from another person taking care of personal grooming?: A Little Help from another person toileting, which includes using toliet, bedpan, or urinal?: Total Help from another person bathing (including washing, rinsing, drying)?: A Lot Help from another person to put on and taking off regular upper body clothing?: A Little Help from another person to put on and taking off regular lower body clothing?: A Lot 6 Click Score: 15    End of Session Equipment Utilized During Treatment: Gait belt;Rolling walker (2 wheels);Oxygen   OT Visit Diagnosis: Unsteadiness on feet (R26.81);Muscle weakness (generalized) (M62.81)   Activity Tolerance Patient tolerated treatment well   Patient Left in chair;with call bell/phone within reach;with family/visitor present   Nurse Communication Mobility status;Other  (comment) (Pt requiring linen change)        Time: 8647-8565 OT Time Calculation (min): 42 min  Charges: OT General Charges $OT Visit: 1 Visit OT Treatments $Self Care/Home Management : 23-37 mins $Therapeutic Activity: 8-22 mins  Maurilio CROME, OTR/L.  [facility] Acute Rehabilitation  Office: [phone]   Maurilio PARAS Samael Blades 06/29/2024, 3:58 PM

## 2024-06-30 ENCOUNTER — Inpatient Hospital Stay (HOSPITAL_COMMUNITY)

## 2024-06-30 DIAGNOSIS — R0602 Shortness of breath: Secondary | ICD-10-CM | POA: Diagnosis not present

## 2024-06-30 HISTORY — PX: IR THORACENTESIS RIGHT ASP PLEURAL SPACE W/IMG GUIDE: IMG5380

## 2024-06-30 LAB — BODY FLUID CELL COUNT WITH DIFFERENTIAL
Eos, Fluid: 2 %
Lymphs, Fluid: 59 %
Monocyte-Macrophage-Serous Fluid: 34 % — ABNORMAL LOW (ref 50–90)
Neutrophil Count, Fluid: 5 % (ref 0–25)
Total Nucleated Cell Count, Fluid: 1884 uL — ABNORMAL HIGH (ref 0–1000)

## 2024-06-30 LAB — CBC
HCT: 34.4 % — ABNORMAL LOW (ref 36.0–46.0)
Hemoglobin: 10.9 g/dL — ABNORMAL LOW (ref 12.0–15.0)
MCH: 29.3 pg (ref 26.0–34.0)
MCHC: 31.7 g/dL (ref 30.0–36.0)
MCV: 92.5 fL (ref 80.0–100.0)
Platelets: 319 K/uL (ref 150–400)
RBC: 3.72 MIL/uL — ABNORMAL LOW (ref 3.87–5.11)
RDW: 17 % — ABNORMAL HIGH (ref 11.5–15.5)
WBC: 6.1 K/uL (ref 4.0–10.5)
nRBC: 0 % (ref 0.0–0.2)

## 2024-06-30 LAB — GRAM STAIN

## 2024-06-30 LAB — BASIC METABOLIC PANEL WITH GFR
Anion gap: 7 (ref 5–15)
BUN: 16 mg/dL (ref 8–23)
CO2: 39 mmol/L — ABNORMAL HIGH (ref 22–32)
Calcium: 9.1 mg/dL (ref 8.9–10.3)
Chloride: 93 mmol/L — ABNORMAL LOW (ref 98–111)
Creatinine, Ser: 0.91 mg/dL (ref 0.44–1.00)
GFR, Estimated: >60 mL/min
Glucose, Bld: 94 mg/dL (ref 70–99)
Potassium: 3.9 mmol/L (ref 3.5–5.1)
Sodium: 139 mmol/L (ref 135–145)

## 2024-06-30 MED ORDER — LIDOCAINE-EPINEPHRINE 1 %-1:100000 IJ SOLN
10.0000 mL | Freq: Once | INTRAMUSCULAR | Status: DC
  Filled 2024-06-30: qty 20

## 2024-06-30 MED ORDER — LIDOCAINE-EPINEPHRINE 1 %-1:100000 IJ SOLN
INTRAMUSCULAR | Status: AC
  Filled 2024-06-30: qty 20

## 2024-06-30 MED ORDER — HYDROCODONE-ACETAMINOPHEN 5-325 MG PO TABS: 1.0000 | ORAL_TABLET | Freq: Once | ORAL | Status: DC

## 2024-06-30 NOTE — Plan of Care (Signed)

## 2024-06-30 NOTE — Progress Notes (Signed)
 "                                                                                                                                                                                                                                                                                PROGRESS NOTE     Patient Demographics:    [name], is a 84 y.o. female, DOB - 1941/01/09, FMW:[id number]  Outpatient Primary MD for the patient is [name], MD    LOS - 9  Admit date - 06/21/2024    Chief Complaint  Patient presents with   Shortness of Breath       Brief Narrative (HPI from H&P)     84 y.o. female with past medical history  of   thyroid  cancer, hyperlipidemia, hypertension, hypothyroid, anxiety, arthritis, OSA on CPAP, and history of sarcoidosis Most recently she was admitted from June 10, 2024 with shock, Staphylococcus epidermidis bacteremia, acute kidney injury, hyperkalemia, and moderate pericardial effusion and discharged June 20, 2024, presented back to ED with shortness of breath.   During recent hospitalization, She was initially admitted to the ICU ,received fresh frozen plasma, vitamin K , lokelma , bicarbonate infusion, and empiric antibiotics. Echocardiography revealed an ejection fraction of 45-50% with a large pericardial effusion without tamponade. Pt had S. epidermidis bacteremia / completed 7 days of IV linezolid .  Patient was seen by cardiology on January 7 for her pericardial effusion with no tamponade on echo with plan for recheck echocardiogram.  Patient was discharged on 5 L of oxygen .  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  Diagnosed with acute hypoxic respiratory failure.  Transferred to my care on 06/23/2024.    Subjective:   No significant events overnight,  denies any complaints, was able to sit for 1 hour on the chair yesterday.     Assessment  & Plan :   Pericardial effusion with constrictive pericarditis  Large left pleural effusion  chronic hypoxic respiratory failure  - Initially on 10 L oxygen , weaning of oxygen , she is on 2 L  oxygen  today . - Encouraged use incentive spirometry . - CTA nonacute without any PE . - s/p postthoracentesis 1/15, 1/17.  - Elevated CRP, but sed rate remains elevated  - Continue with colchicine , continue for 54-month or longer until inflammatory markers normalize. - Hold NSAIDs as did not help much-on PPI for GI protection. - On IV diuresis, manage per cardiology, creatinine remained stable. - Currently anticoagulation on hold, but likely will start on warfarin on Friday and allowed to trend up gradually, without bridge to avoid hemorrhagic conversion. - Left pleural effusion s/p thoracentesis 1/15, 1/17, again today performed thoracentesis with 350 cc output, improving with IV diuresis .  Cussed with IR, there was no evidence of any right pleural effusion.  Permanent atrial fibrillation: Rate controlled.   -please see above regarding anticoagulation with warfarin  Continue with diltiazem  360 mg daily Lab Results  Component Value Date   INR 1.2 06/28/2024   INR 1.4 (H) 06/27/2024   INR 2.2 (H) 06/26/2024   PROTIME 25.2 (H) 11/29/2014   Hypertension: Blood pressure controlled.  Continue Cardizem , holding home dose of Avapro .  History of PE -On warfarin at home, CTA chest this admission with no evidence of PE.   GERD:   on PPI.   Hypokalemia, hypophosphatemia.  Replaced.    Chronic dysphagia.  Speech following.  Currently on dysphagia 3 diet.    Acquired hypothyroidism: Continue Synthroid .   OSA: Nightly CPAP ordered.   Anemia of chronic disease: Stable.   Obesity.  BMI 38.  Follow-up with PCP.  Type 2 diabetes mellitus:   Not on any medications PTA.  Currently on SSI.  Lab Results   Component Value Date   HGBA1C 7.6 (H) 06/10/2024   CBG (last 3)  Recent Labs    06/27/24 1606  GLUCAP 115*        Condition - Extremely Guarded  Family Communication  : Discussed with husband at bedside 1/21  Code Status : Full code  Consults  : IR, cardiology, pulmonary  PUD Prophylaxis :     Procedures  :     Repeat echocardiogram 06/26/2024.    1. Small pericardial effusion posterior to the LV. Poorly interrogated on this study. IVC dilated without collapse. Respiraophasic septal shift noted, indicative of increased ventricular interdependence. a small pericardial effusion is present. The pericardial effusion is posterior to the left ventricle. There is no evidence of cardiac tamponade.  2. Left ventricular ejection fraction, by estimation, is 55 to 60%. The left ventricle has normal function. Left ventricular endocardial border not optimally defined to evaluate regional wall motion. Left ventricular diastolic function could not be evaluated.  3. Right ventricular systolic function is mildly reduced. The right ventricular size is moderately enlarged.  4. Left atrial size was severely dilated.  5. Right atrial size was severely dilated.  6. The mitral valve is grossly normal. Trivial mitral valve regurgitation. No evidence of mitral stenosis.  7. The inferior vena cava is dilated in size with <50% respiratory variability, suggesting right atrial pressure of 15 mmHg   Repeat left-sided ultrasound-guided thoracentesis by pulmonary on 06/25/2024.    Left ultrasound-guided thoracentesis by IR on 06/23/2024.  900 cc fluid removed, chemistries pending  CTA.  1. No evidence of pulmonary embolism. 2. Small to moderate size right pleural effusion with associated basilar atelectasis. Small left pleural effusion with associated basilar atelectasis. 3. Subtle hazy density over the perihilar region of the left upper lobe possibly due to minimal asymmetric edema and less likely  early infection. 4.  Mild stable cardiomegaly with interval decrease in pericardial effusion. Atherosclerotic coronary artery disease. 5. Aortic atherosclerosis. 6. Evidence of old granulomatous disease. Aortic Atherosclerosis (ICD10-I70.0).  Cardiac MRI.  1. There is a moderate pericardial effusion. The inferior vena cava is dilated with reduced respiratory collapse, consistent with elevated right sided filling pressures. In diastole, the left ventricle has a D shaped configuration with accentuated respiratory variation in septal position (septal bounce) on strain imaging and with free breathing, indicating ventricular interdependence. This is seen during inspiration and can be seen in constrictive physiology. 2. Normal left ventricular systolic function (LVEF =61%). 3. Decreased right ventricular systolic function (RVEF =38%). 4. There are bilateral pleural effusions with secondary atelectasis  Echocardiogram -  1. Moderate pericardial effusion which is more prominent up to 2.0 cm posterior to the LV (best seen subcostal; image 49). The RV is poorly visualized, but does not appear to collapse in diastole. The IVC is dilated. There is evidence of respiratory variation in septal movement on image 41. Findings are suggestive of a clinically significant pericardial effusion. Clinical correlation is recommended. Appearance is stable compared to prior study. Moderate pericardial effusion. The pericardial effusion  is circumferential.  2. Right ventricular systolic function was not well visualized. The right ventricular size is not well visualized. Tricuspid regurgitation signal is inadequate for assessing PA pressure.  3. The mitral valve is grossly normal. Trivial mitral valve regurgitation. No evidence of mitral stenosis.  4. The aortic valve is tricuspid. Aortic valve regurgitation is not visualized. Aortic valve sclerosis is present, with no evidence of aortic valve stenosis.  5. The inferior vena cava is dilated in size with <50%  respiratory variability, suggesting right atrial pressure of 15 mmHg.  6. Left ventricular ejection fraction, by estimation, is 60 to 65%. The left ventricle has normal function. Left ventricular endocardial border not optimally defined to evaluate regional wall motion. Comparison(s): No significant change from prior study.      Disposition Plan  :    Status is: Inpatient PPI  DVT Prophylaxis  : Coumadin   Lab Results  Component Value Date   INR 1.2 06/28/2024   INR 1.4 (H) 06/27/2024   INR 2.2 (H) 06/26/2024   PROTIME 25.2 (H) 11/29/2014     Lab Results  Component Value Date   PLT 319 06/30/2024    Diet :  Diet Order             DIET DYS 3 Room service appropriate? Yes; Fluid consistency: Thin  Diet effective now                    Inpatient Medications  Scheduled Meds:  colchicine   0.6 mg Oral BID   diltiazem   360 mg Oral Daily   furosemide   40 mg Intravenous BID   Gerhardt's butt cream   Topical BID   heparin  injection (subcutaneous)  5,000 Units Subcutaneous Q8H   lactose free nutrition  237 mL Oral BID WC   levothyroxine   112 mcg Oral q morning   lidocaine -EPINEPHrine   10 mL Intradermal Once   pantoprazole   40 mg Oral Daily   potassium chloride   40 mEq Oral Daily   sodium chloride  flush  3 mL Intravenous Q12H   spironolactone   25 mg Oral Daily   Continuous Infusions:   PRN Meds:.acetaminophen  **OR** acetaminophen , alum & mag hydroxide-simeth, artificial tears, guaiFENesin , LORazepam , ondansetron  **OR** ondansetron  (ZOFRAN ) IV, polyethylene glycol     Objective:   Vitals:   06/29/24  2017 06/30/24 0000 06/30/24 0011 06/30/24 0402  BP:  121/71  111/85  Pulse:  98  92  Resp:  (!) 25  20  Temp: 97.6 F (36.4 C)  (!) 97.4 F (36.3 C) (!) 97.5 F (36.4 C)  TempSrc: Oral  Oral Oral  SpO2:  92%  95%  Weight:      Height:        Wt Readings from Last 3 Encounters:  06/21/24 102.1 kg  06/20/24 108.7 kg  05/27/24 109.3 kg     Intake/Output  Summary (Last 24 hours) at 06/30/2024 1132 Last data filed at 06/29/2024 2245 Gross per 24 hour  Intake 3 ml  Output 700 ml  Net -697 ml     Physical Exam  Awake, alert, no apparent distress  Improved air entry at the bases, no wheezing rales or rhonchi  Irregular  Abdomen soft  Lower extremities with no edema .    RN pressure injury documentation: Wound 06/22/24 1541 Pressure Injury Sacrum Medial Stage 2 -  Partial thickness loss of dermis presenting as a shallow open injury with a red, pink wound bed without slough. (Active)      Data Review:    Recent Labs  Lab 06/24/24 0411 06/25/24 0436 06/26/24 0545 06/27/24 0255 06/28/24 0325 06/29/24 0344 06/30/24 0319  WBC 7.6 7.4 6.5 7.5 6.1 6.0 6.1  HGB 9.7* 9.2* 9.2* 9.7* 9.9* 10.0* 10.9*  HCT 30.2* 28.6* 27.7* 30.0* 31.1* 32.1* 34.4*  PLT 103* 103* 109* 159 212 254 319  MCV 92.6 91.7 91.4 92.0 92.3 93.0 92.5  MCH 29.8 29.5 30.4 29.8 29.4 29.0 29.3  MCHC 32.1 32.2 33.2 32.3 31.8 31.2 31.7  RDW 16.5* 16.6* 16.4* 16.5* 16.4* 16.5* 17.0*  LYMPHSABS 1.3 1.6  --   --   --   --   --   MONOABS 1.6* 2.1*  --   --   --   --   --   EOSABS 0.1 0.1  --   --   --   --   --   BASOSABS 0.0 0.0  --   --   --   --   --     Recent Labs  Lab 06/24/24 0411 06/25/24 0436 06/25/24 1239 06/26/24 0545 06/27/24 0255 06/28/24 0325 06/28/24 1441 06/29/24 0344 06/30/24 0319  NA 140 141  --  137 138  --  136 136 139  K 3.4* 3.5  --  3.3* 4.3  --  4.7 4.0 3.9  CL 93* 93*  --  91* 91*  --  93* 92* 93*  CO2 39* 41*  --  37* 41*  --  33* 39* 39*  ANIONGAP 8 6  --  8 5  --  9 5 7   GLUCOSE 86 88  --  98 91  --  122* 96 94  BUN 17 19  --  16 13  --  15 16 16   CREATININE 0.64 0.68  --  0.68 0.66  --  0.96 0.91 0.91  ALBUMIN  --   --  2.7*  --   --   --   --   --   --   CRP  --   --   --   --   --  2.7*  --   --   --   INR 3.3* 2.8*  --  2.2* 1.4* 1.2  --   --   --   MG 1.9 2.1  --  1.8 1.8  --   --  1.9  --   PHOS 2.1* 2.6  --   --   --    --   --  3.5  --   CALCIUM  8.3* 8.6*  --  8.8* 9.2  --  9.2 8.9 9.1      Recent Labs  Lab 06/24/24 0411 06/25/24 0436 06/26/24 0545 06/27/24 0255 06/28/24 0325 06/28/24 1441 06/29/24 0344 06/30/24 0319  CRP  --   --   --   --  2.7*  --   --   --   INR 3.3* 2.8* 2.2* 1.4* 1.2  --   --   --   MG 1.9 2.1 1.8 1.8  --   --  1.9  --   CALCIUM  8.3* 8.6* 8.8* 9.2  --  9.2 8.9 9.1    --------------------------------------------------------------------------------------------------------------- Lab Results  Component Value Date   CHOL 242 (H) 01/12/2023   HDL 62 01/12/2023   LDLCALC 157 (H) 01/12/2023   TRIG 130 01/12/2023   CHOLHDL 3.9 01/12/2023    Lab Results  Component Value Date   HGBA1C 7.6 (H) 06/10/2024   No results for input(s): TSH, T4TOTAL, FREET4, T3FREE, THYROIDAB in the last 72 hours. No results for input(s): VITAMINB12, FOLATE, FERRITIN, TIBC, IRON, RETICCTPCT in the last 72 hours. ------------------------------------------------------------------------------------------------------------------ Cardiac Enzymes No results for input(s): CKMB, TROPONINI, MYOGLOBIN in the last 168 hours.  Invalid input(s): CK  Micro Results Recent Results (from the past 240 hours)  Resp panel by RT-PCR (RSV, Flu A&B, Covid) Anterior Nasal Swab     Status: None   Collection Time: 06/21/24 12:26 PM   Specimen: Anterior Nasal Swab  Result Value Ref Range Status   SARS Coronavirus 2 by RT PCR NEGATIVE NEGATIVE Final   Influenza A by PCR NEGATIVE NEGATIVE Final   Influenza B by PCR NEGATIVE NEGATIVE Final    Comment: (NOTE) The Xpert Xpress SARS-CoV-2/FLU/RSV plus assay is intended as an aid in the diagnosis of influenza from Nasopharyngeal swab specimens and should not be used as a sole basis for treatment. Nasal washings and aspirates are unacceptable for Xpert Xpress SARS-CoV-2/FLU/RSV testing.  Fact Sheet for  Patients: bloggercourse.com  Fact Sheet for Healthcare Providers: seriousbroker.it  This test is not yet approved or cleared by the [place]  FDA and has been authorized for detection and/or diagnosis of SARS-CoV-2 by FDA under an Emergency Use Authorization (EUA). This EUA will remain in effect (meaning this test can be used) for the duration of the COVID-19 declaration under Section 564(b)(1) of the Act, 21 U.S.C. section 360bbb-3(b)(1), unless the authorization is terminated or revoked.     Resp Syncytial Virus by PCR NEGATIVE NEGATIVE Final    Comment: (NOTE) Fact Sheet for Patients: bloggercourse.com  Fact Sheet for Healthcare Providers: seriousbroker.it  This test is not yet approved or cleared by the [place]  FDA and has been authorized for detection and/or diagnosis of SARS-CoV-2 by FDA under an Emergency Use Authorization (EUA). This EUA will remain in effect (meaning this test can be used) for the duration of the COVID-19 declaration under Section 564(b)(1) of the Act, 21 U.S.C. section 360bbb-3(b)(1), unless the authorization is terminated or revoked.  Performed at [facility], [address] [place], [place] [address]   Body fluid culture w Gram Stain     Status: None   Collection Time: 06/25/24 11:45 AM   Specimen: Pleural Fluid  Result Value Ref Range Status   Specimen Description PLEURAL  Final   Special Requests PLEURAL,LEFT  Final  Gram Stain   Final    RARE WBC PRESENT, PREDOMINANTLY MONONUCLEAR NO ORGANISMS SEEN    Culture   Final    NO GROWTH 3 DAYS Performed at [facility], [address] [place], [place] [address]    Report Status 06/28/2024 FINAL  Final    Radiology Report DG Chest 1 View Result Date: 06/30/2024 CLINICAL DATA:  Post left-sided thoracentesis. EXAM: CHEST  1 VIEW COMPARISON:  06/29/2024 FINDINGS: Lungs  are hypoinflated demonstrate interval improvement and patient's left-sided pleural effusion likely with associated left basilar atelectasis. No left-sided pneumothorax. Right lung is clear. Mild stable cardiomegaly. Remainder of the exam is unchanged. IMPRESSION: 1. Interval improvement in patient's left-sided pleural effusion likely with associated left basilar atelectasis. No pneumothorax. 2. Stable cardiomegaly. Electronically Signed   By: Toribio Agreste M.D.   On: 06/30/2024 09:39   DG Chest 2 View Result Date: 06/29/2024 CLINICAL DATA:  Pleural effusion. EXAM: CHEST - 2 VIEW COMPARISON:  06/25/2024 FINDINGS: Lungs are adequately inflated with moderate opacification over the left base/retrocardiac region with interval worsening likely effusion with associated compressive atelectasis. Stable cardiomegaly. Calcified hilar/mediastinal lymph nodes. Remainder of the exam is unchanged. IMPRESSION: 1. Moderate opacification over the left base/retrocardiac region with interval worsening likely effusion with associated compressive atelectasis. 2. Stable cardiomegaly. Electronically Signed   By: Toribio Agreste M.D.   On: 06/29/2024 16:29      Signature  -   Brayton Lye M.D on 06/30/2024 at 11:32 AM   -  To page go to [email or web address]   "

## 2024-06-30 NOTE — Procedures (Signed)
 PROCEDURE SUMMARY:  Successful US  guided left thoracentesis. Yielded 350 mL of blood-tinged, amber fluid. Pt tolerated procedure well. No immediate complications.  Specimen was sent for labs. CXR ordered.  EBL < 5 mL  Solmon Selmer Ku PA-C 06/30/2024 11:06 AM

## 2024-06-30 NOTE — TOC Progression Note (Signed)
 Transition of Care [facility]) - Progression Note    Patient Details  Name: [name] MRN: [id number] Date of Birth: [birth year]  [facility] of [facility]) CM/SW Contact  Inocente GORMAN Kindle, LCSW Phone Number: 06/30/2024, 10:53 AM  Clinical Narrative:    Updated [name] of likely discharge tomorrow.    Expected Discharge Plan: Skilled Nursing Facility Barriers to Discharge: Continued Medical Work up               Expected Discharge Plan and Services In-house Referral: Clinical Social Work   Post Acute Care Choice: Skilled Nursing Facility Living arrangements for the past 2 months: Single Family Home                                       Social Drivers of Health (SDOH) Interventions SDOH Screenings   Food Insecurity: No Food Insecurity (06/22/2024)  Housing: Low Risk (06/22/2024)  Transportation Needs: No Transportation Needs (06/22/2024)  Utilities: Not At Risk (06/22/2024)  Depression (PHQ2-9): Low Risk (05/27/2024)  Social Connections: Moderately Integrated (06/22/2024)  Tobacco Use: Low Risk (06/21/2024)    Readmission Risk Interventions    06/13/2024   11:12 AM 09/19/2022    2:13 PM  Readmission Risk Prevention Plan  Post Dischage Appt  Complete  Medication Screening  Complete  Transportation Screening Complete   PCP or Specialist Appt within 3-5 Days Complete   HRI or Home Care Consult Complete   Social Work Consult for Recovery Care Planning/Counseling Complete   Palliative Care Screening Not Applicable   Medication Review Oceanographer) Complete

## 2024-06-30 NOTE — Progress Notes (Signed)
 Physical Therapy Treatment Patient Details Name: SHALLON YAKLIN MRN: [id number] DOB: [birth year] Today's Date: 06/30/2024   History of Present Illness Pt is 84 year old presented to [facility] on  06/21/24 for SOB. Was just discharged from [facility] to [facility] on 06/20/24. At [facility] pt with bacteremia and pericardial effusion. Pt now with acute hypoxic respiratory failure, pleural effusion, and acute on chronic CHF.  PMH - CHF, HTN, arthritis, anxiety, thyroid  CA, afib, PE, DM, OSA    PT Comments  Pt received in supine and agreeable to session. Pt able to perform 2 short gait trials, but is limited by DOE and quick fatigue requiring a seated rest break. SpO2 stable on 2L throughout session. Pt declines ambulation in the hall due to fatigue and fear of falling. Pt continues to benefit from PT services to progress toward functional mobility goals.     If plan is discharge home, recommend the following: A little help with walking and/or transfers;A lot of help with bathing/dressing/bathroom;Assistance with cooking/housework;Assist for transportation;Help with stairs or ramp for entrance   Can travel by private vehicle     No  Equipment Recommendations  None recommended by PT (Pt with appropriate equipment at home)    Recommendations for Other Services       Precautions / Restrictions Precautions Precautions: Fall;Other (comment) Recall of Precautions/Restrictions: Intact Precaution/Restrictions Comments: monitor O2 Restrictions Weight Bearing Restrictions Per Provider Order: No     Mobility  Bed Mobility Overal bed mobility: Needs Assistance Bed Mobility: Supine to Sit     Supine to sit: Supervision     General bed mobility comments: increased time    Transfers Overall transfer level: Needs assistance Equipment used: Rolling walker (2 wheels) Transfers: Sit to/from Stand Sit to Stand: Contact guard assist           General transfer comment: increased time for rise and  reliance on BUE support, but no physical assist needed    Ambulation/Gait Ambulation/Gait assistance: Contact guard assist Gait Distance (Feet): 40 Feet (+20)   Gait Pattern/deviations: Step-through pattern, Decreased stride length, Narrow base of support, Trunk flexed       General Gait Details: slow gait with low foot clearance. SpO2 WFL on 2L during ambulation, but poor pleth until resting. assist for line management   Stairs             Wheelchair Mobility     Tilt Bed    Modified Rankin (Stroke Patients Only)       Balance Overall balance assessment: Needs assistance Sitting-balance support: No upper extremity supported, Feet supported Sitting balance-Leahy Scale: Good Sitting balance - Comments: EOB   Standing balance support: Bilateral upper extremity supported, During functional activity, Reliant on assistive device for balance Standing balance-Leahy Scale: Poor Standing balance comment: Dependent on RW                            Communication Communication Communication: Impaired Factors Affecting Communication: Hearing impaired  Cognition Arousal: Alert Behavior During Therapy: WFL for tasks assessed/performed   PT - Cognitive impairments: No apparent impairments                         Following commands: Intact      Cueing Cueing Techniques: Verbal cues  Exercises      General Comments General comments (skin integrity, edema, etc.): Pt on 2L O2 throughout with SpO2 >90%  Pertinent Vitals/Pain Pain Assessment Pain Assessment: Faces Faces Pain Scale: Hurts little more Pain Location: Bottom Pain Descriptors / Indicators: Discomfort, Sore Pain Intervention(s): Limited activity within patient's tolerance, Monitored during session, Repositioned     PT Goals (current goals can now be found in the care plan section) Acute Rehab PT Goals Patient Stated Goal: return home PT Goal Formulation: With  patient/family Time For Goal Achievement: 07/06/24 Progress towards PT goals: Progressing toward goals    Frequency    Min 2X/week       AM-PAC PT 6 Clicks Mobility   Outcome Measure  Help needed turning from your back to your side while in a flat bed without using bedrails?: None Help needed moving from lying on your back to sitting on the side of a flat bed without using bedrails?: A Little Help needed moving to and from a bed to a chair (including a wheelchair)?: A Little Help needed standing up from a chair using your arms (e.g., wheelchair or bedside chair)?: A Little Help needed to walk in hospital room?: A Little Help needed climbing 3-5 steps with a railing? : A Lot 6 Click Score: 18    End of Session Equipment Utilized During Treatment: Gait belt;Oxygen  Activity Tolerance: Patient limited by fatigue;Patient tolerated treatment well Patient left: in chair;with call bell/phone within reach Nurse Communication: Mobility status PT Visit Diagnosis: Unsteadiness on feet (R26.81);Muscle weakness (generalized) (M62.81)     Time: 8553-8486 PT Time Calculation (min) (ACUTE ONLY): 27 min  Charges:    $Gait Training: 8-22 mins $Therapeutic Activity: 8-22 mins PT General Charges $$ ACUTE PT VISIT: 1 Visit                    Darryle George, PTA Acute Rehabilitation Services Secure Chat Preferred  [phone]    Darryle George 06/30/2024, 3:28 PM

## 2024-06-30 NOTE — Progress Notes (Signed)
"  °  Progress Note  Patient Name: [name] Date of Encounter: 06/30/2024 [facility] Cardiologist: Georganna Archer, MD   Interval Summary   Stable overall had thoracentesis today 350 ML. Family at bedside.  Vital Signs Vitals:   06/29/24 2017 06/30/24 0000 06/30/24 0011 06/30/24 0402  BP:  121/71  111/85  Pulse:  98  92  Resp:  (!) 25  20  Temp: 97.6 F (36.4 C)  (!) 97.4 F (36.3 C) (!) 97.5 F (36.4 C)  TempSrc: Oral  Oral Oral  SpO2:  92%  95%  Weight:      Height:        Intake/Output Summary (Last 24 hours) at 06/30/2024 1059 Last data filed at 06/29/2024 2245 Gross per 24 hour  Intake 3 ml  Output 700 ml  Net -697 ml      06/21/2024    9:25 PM 06/20/2024    4:45 AM 06/19/2024    5:00 AM  Last 3 Weights  Weight (lbs) 225 lb 239 lb 10.2 oz 240 lb 8.4 oz  Weight (kg) 102.059 kg 108.7 kg 109.1 kg      Telemetry/ECG  Afib rate controlled PVCs brief NSVT - Personally Reviewed  Physical Exam  GEN: No acute distress.   Neck: No JVD sitting upright Cardiac: iRRR, no murmurs, rubs, or gallops.  Respiratory: Clear to auscultation bilaterally. GI: Soft, nontender, non-distended  MS: 1+ edema legs wrapped  Assessment & Plan  Effusive constrictive pericarditis - follow ESR/CRP, crp improved, sed rate elevated. Suggests continued inflammation. - continue colchicine  0.6 mg BID. Tolerating well. Continue colchicine  for 3 months or longer until inflammatory markers normalize. - did not tolerate ibuprofen  due to headache. Stop for now.  - PPI for gastric protection. - she is on warfarin for permanent afib. This is currently held and was reversed for possible procedure. With improving inflammation and improving effusion, will consider starting warfarin prior to discharge, with plan to let INR trend upward. She was reversed so therapeutic INR may take longer. Risk of hemorrhagic conversion certainly present, but hopefully risk dissipates with improving  inflammatory markers. Consider eliquis , though pricing is the barrier per patient and husband.  - will continue diuresis today. No hypotension. Lasix  40 mg IV bid. Labs stable.   Permanent Afib - warfarin plan as above. Eliquis  cost prohibitive for patient. - continue dilt 360 mg daily.  -repeat INR tomorrow and consider resuming warfarin on day of discharge. Will not bridge due to concerns of hemorrhagic effusion, can allow pt to drift back to therapeutic INR. Pt and husband understand risk of hemorrhagic conversion of effusion balanced with risk of stroke off AC.   Pleural effusion - may be secondary to constrictive pericardial physiology. Multiple thoracentesis. Sx improved with fluid removal.  - CXR seems to suggest reaccumulation of effusion on left compared to CXR from 1/17. Underwent thoracentesis this am 350 ml out on left. . Exudative effusion by lights criteria suggests inflammatory etiology.    For questions or updates, please contact [facility] Please consult [email or web address] for contact info under         Signed, Soyla DELENA Merck, MD   "

## 2024-07-01 ENCOUNTER — Inpatient Hospital Stay (HOSPITAL_COMMUNITY)

## 2024-07-01 DIAGNOSIS — I3139 Other pericardial effusion (noninflammatory): Secondary | ICD-10-CM | POA: Diagnosis not present

## 2024-07-01 DIAGNOSIS — R0602 Shortness of breath: Secondary | ICD-10-CM | POA: Diagnosis not present

## 2024-07-01 LAB — ECHOCARDIOGRAM LIMITED
AR max vel: 1.6 cm2
AV Area VTI: 1.51 cm2
AV Area mean vel: 1.5 cm2
AV Mean grad: 4 mmHg
AV Peak grad: 6.6 mmHg
Ao pk vel: 1.28 m/s
Area-P 1/2: 4.68 cm2
Height: 64 in
S' Lateral: 3.4 cm
Weight: 3600 [oz_av]

## 2024-07-01 LAB — C-REACTIVE PROTEIN: CRP: 1.4 mg/dL — ABNORMAL HIGH

## 2024-07-01 LAB — BASIC METABOLIC PANEL WITH GFR
Anion gap: 8 (ref 5–15)
BUN: 20 mg/dL (ref 8–23)
CO2: 35 mmol/L — ABNORMAL HIGH (ref 22–32)
Calcium: 9.1 mg/dL (ref 8.9–10.3)
Chloride: 92 mmol/L — ABNORMAL LOW (ref 98–111)
Creatinine, Ser: 1.04 mg/dL — ABNORMAL HIGH (ref 0.44–1.00)
GFR, Estimated: 52 mL/min — ABNORMAL LOW
Glucose, Bld: 98 mg/dL (ref 70–99)
Potassium: 3.7 mmol/L (ref 3.5–5.1)
Sodium: 136 mmol/L (ref 135–145)

## 2024-07-01 LAB — SEDIMENTATION RATE: Sed Rate: 77 mm/h — ABNORMAL HIGH (ref 0–22)

## 2024-07-01 MED ORDER — WARFARIN SODIUM 5 MG PO TABS: 5.0000 mg | ORAL_TABLET | Freq: Every day | ORAL | Status: AC

## 2024-07-01 MED ORDER — BOOST PLUS PO LIQD: 237.0000 mL | Freq: Two times a day (BID) | ORAL | Status: AC

## 2024-07-01 MED ORDER — PANTOPRAZOLE SODIUM 40 MG PO TBEC: 40.0000 mg | DELAYED_RELEASE_TABLET | Freq: Every day | ORAL | Status: AC

## 2024-07-01 MED ORDER — FUROSEMIDE 40 MG PO TABS
40.0000 mg | ORAL_TABLET | Freq: Two times a day (BID) | ORAL | Status: DC
  Administered 2024-07-01 (×2): 40 mg via ORAL
  Filled 2024-07-01 (×2): qty 1

## 2024-07-01 MED ORDER — FUROSEMIDE 40 MG PO TABS: 40.0000 mg | ORAL_TABLET | Freq: Two times a day (BID) | ORAL | Status: AC

## 2024-07-01 MED ORDER — COLCHICINE 0.6 MG PO TABS: 0.6000 mg | ORAL_TABLET | Freq: Two times a day (BID) | ORAL | Status: AC

## 2024-07-01 MED ORDER — LORAZEPAM 0.5 MG PO TABS: 0.5000 mg | ORAL_TABLET | Freq: Two times a day (BID) | ORAL | 0 refills | Status: AC | PRN

## 2024-07-01 MED ORDER — GUAIFENESIN ER 600 MG PO TB12: 600.0000 mg | ORAL_TABLET | Freq: Two times a day (BID) | ORAL | Status: AC

## 2024-07-01 MED ORDER — SPIRONOLACTONE 25 MG PO TABS: 25.0000 mg | ORAL_TABLET | Freq: Every day | ORAL | Status: AC

## 2024-07-01 MED ORDER — POTASSIUM CHLORIDE CRYS ER 20 MEQ PO TBCR: 20.0000 meq | EXTENDED_RELEASE_TABLET | Freq: Every day | ORAL | Status: AC

## 2024-07-01 NOTE — Progress Notes (Signed)
"  °  Progress Note  Patient Name: [name] Date of Encounter: 07/01/2024 [facility] Cardiologist: Georganna Archer, MD   Interval Summary   Feeling stable, no changes.   Vital Signs Vitals:   06/30/24 2023 07/01/24 0019 07/01/24 0416 07/01/24 0819  BP: 127/70 134/76 134/78 110/60  Pulse: 90 94 93 (!) 102  Resp: 20 (!) 22 20 19   Temp: 97.7 F (36.5 C) 97.7 F (36.5 C) (!) 97.5 F (36.4 C) (!) 97.4 F (36.3 C)  TempSrc: Oral Oral Oral Oral  SpO2: 94% 96% 94% 94%  Weight:      Height:        Intake/Output Summary (Last 24 hours) at 07/01/2024 1100 Last data filed at 07/01/2024 0533 Gross per 24 hour  Intake 3 ml  Output 1190 ml  Net -1187 ml      06/21/2024    9:25 PM 06/20/2024    4:45 AM 06/19/2024    5:00 AM  Last 3 Weights  Weight (lbs) 225 lb 239 lb 10.2 oz 240 lb 8.4 oz  Weight (kg) 102.059 kg 108.7 kg 109.1 kg      Telemetry/ECG  Afib rate controlled PVCs - Personally Reviewed  Physical Exam  GEN: No acute distress.   Neck: No JVD  Cardiac: iRRR, no murmurs, rubs, or gallops.  Respiratory: Clear to auscultation bilaterally. GI: Soft, nontender, non-distended  MS: no edema, warm, legs wrapped  Assessment & Plan  Effusive constrictive pericarditis - follow ESR/CRP, crp and sed rate improving. Suggests resolving inflammation. - continue colchicine  0.6 mg BID. Tolerating well. Continue colchicine  for 3 months or longer until inflammatory markers normalize. - did not tolerate ibuprofen  due to headache. Stop for now.  - PPI for gastric protection. - she is on warfarin for permanent afib. This is currently held and was reversed for possible procedure. With improving inflammation and improving effusion, will consider starting warfarin prior to discharge, with plan to let INR trend upward. She was reversed so therapeutic INR may take longer. Risk of hemorrhagic conversion certainly present, but hopefully risk dissipates with improving inflammatory  markers. Consider eliquis , though pricing is the barrier per patient and husband.  - stop IV lasix  today, transition to oral lasix  40 mg po bid.    Permanent Afib - warfarin plan as above. Eliquis  cost prohibitive for patient. - continue dilt 360 mg daily.  -repeat INR tomorrow and consider resuming warfarin on day of discharge. Will not bridge due to concerns of hemorrhagic effusion, can allow pt to drift back to therapeutic INR. Pt and husband understand risk of hemorrhagic conversion of effusion balanced with risk of stroke off AC.   Pleural effusion - may be secondary to constrictive pericardial physiology. Multiple thoracentesis. Sx improved with fluid removal.  - CXR seems to suggest reaccumulation of effusion on left compared to CXR from 1/17. Underwent thoracentesis this am 350 ml out on left on 06/30/24. Exudative effusion by lights criteria suggests inflammatory etiology.    For questions or updates, please contact [facility] Please consult [email or web address] for contact info under         Signed, Soyla DELENA Merck, MD   "

## 2024-07-01 NOTE — TOC Transition Note (Signed)
 Transition of Care [facility]) - Discharge Note   Patient Details  Name: [name] MRN: [id number] Date of Birth: 07-20-1940  Transition of Care [facility]) CM/SW Contact:  Inocente GORMAN Kindle, LCSW Phone Number: 07/01/2024, 2:10 PM   Clinical Narrative:    Patient will DC to: [facility] Anticipated DC date: 07/01/24 Family notified: Spouse at bedside Transport by: [facility]   Per MD patient ready for DC to [facility]. RN to call report prior to discharge [phone] room [phone]). RN, patient, patient's family, and facility notified of DC. Discharge Summary and FL2 sent to facility. DC packet on chart including signed script. Ambulance transport requested for patient.   CSW will sign off for now as social work intervention is no longer needed. Please consult us  again if new needs arise.     Final next level of care: Skilled Nursing Facility Barriers to Discharge: Barriers Resolved   Patient Goals and CMS Choice Patient states their goals for this hospitalization and ongoing recovery are:: Return to rehab          Discharge Placement   Existing PASRR number confirmed : 07/01/24          Patient chooses bed at: [facility] Patient to be transferred to facility by: [facility] Name of family member notified: Spouse Patient and family notified of of transfer: 07/01/24  Discharge Plan and Services Additional resources added to the After Visit Summary for   In-house Referral: Clinical Social Work   Post Acute Care Choice: Skilled Nursing Facility                               Social Drivers of Health (SDOH) Interventions SDOH Screenings   Food Insecurity: No Food Insecurity (06/22/2024)  Housing: Low Risk (06/22/2024)  Transportation Needs: No Transportation Needs (06/22/2024)  Utilities: Not At Risk (06/22/2024)  Depression (PHQ2-9): Low Risk (05/27/2024)  Social Connections: Moderately Integrated (06/22/2024)  Tobacco Use: Low Risk (06/21/2024)      Readmission Risk Interventions    06/13/2024   11:12 AM 09/19/2022    2:13 PM  Readmission Risk Prevention Plan  Post Dischage Appt  Complete  Medication Screening  Complete  Transportation Screening Complete   PCP or Specialist Appt within 3-5 Days Complete   HRI or Home Care Consult Complete   Social Work Consult for Recovery Care Planning/Counseling Complete   Palliative Care Screening Not Applicable   Medication Review [employer]) Complete

## 2024-07-01 NOTE — Progress Notes (Signed)
 Reviewed AVS with patient and family  [facility] to pick up.

## 2024-07-01 NOTE — Discharge Summary (Signed)
 . Physician Discharge Summary  RASHADA KLONTZ FMW:[id number] DOB: 1940-09-06 DOA: 06/21/2024  PCP: [name], MD  Admit date: 06/21/2024 Discharge date: 07/01/2024  Admitted From: ( SNF) Disposition:  (SNF)  Recommendations for Outpatient Follow-up:  Patient to continue with care home CPAP machine with 2 L oxygen  bleed in at nighttime (husband will bring the machine to the facility is with him currently) Patient to start on warfarin from 06/28/2024 to allow INR to drift gradually Target INR 2-3, please monitor INR closely and adjust as needed Obtain CBC, BMP next week Is encouraged to use incentive spirometry and flutter valve    Diet recommendation: Heart Healthy with 1500 cc fluid restriction  Brief/Interim Summary:  84 y.o. female with past medical history  of   thyroid  cancer, hyperlipidemia, hypertension, hypothyroid, anxiety, arthritis, OSA on CPAP, and history of sarcoidosis Most recently she was admitted from June 10, 2024 with shock, Staphylococcus epidermidis bacteremia, acute kidney injury, hyperkalemia, and moderate pericardial effusion and discharged June 20, 2024, presented back to ED with shortness of breath.   During recent hospitalization, She was initially admitted to the ICU ,received fresh frozen plasma, vitamin K , lokelma , bicarbonate infusion, and empiric antibiotics. Echocardiography revealed an ejection fraction of 45-50% with a large pericardial effusion without tamponade. Pt had S. epidermidis bacteremia / completed 7 days of IV linezolid .  Patient was seen by cardiology on January 7 for her pericardial effusion with no tamponade on echo with plan for recheck echocardiogram.  Patient was discharged on 5 L of oxygen .   She presented back with shortness of breath on 06/21/2024, this time workup suggests moderate pericardial effusion with cardiac MRI suggesting possible constrictive pericarditis, also has large left-sided pleural effusion with  atelectasis.     Pericardial effusion with constrictive pericarditis  Large left pleural effusion (inflammatory having adjacent pericarditis) Acute on chronic hypoxic respiratory failure  - Send at baseline on 2 L nasal cannula only at bedtime with her CPAP, but to be recently discharged on 5 L oxygen , requiring up to 10 L on admission, down to 2 L currently, continue to wean as tolerated for target oxygen  only with BiPAP at bedtime.  -Keep encouraging to use incentive spirometry and flutter valve - CTA nonacute without any PE . - s/p postthoracentesis 1/15 (1.7 L), 1/17 (0.7 L), 1/22 (0.35 L), workup significant for inflammatory pleural effusion most likely due to surrounding pericarditis - Elevated CRP, and sed rate, but trending down  - Continue with colchicine , continue for 37-month or longer until inflammatory markers normalize. - Hold NSAIDs as did not help much-on PPI for GI protection. - On IV diuresis, manage per cardiology, volume status much improved, creatinine started to trend up, so transitioned to p.o. Lasix  40 mg twice daily . - Currently anticoagulation on hold, actually required vitamin K  on admission, cardiology commended her to start on warfarin tomorrow without a bridge and let INR drift back to therapeutic INR slowly, cardiology discussed with husband and patient, they understand risk of hemorrhagic conversion of infusion balanced with risk of stroke off anticoagulation .   Permanent atrial fibrillation: Rate controlled.   -please see above regarding anticoagulation with warfarin  Continue with diltiazem  360 mg daily  Hypertension: Blood pressure controlled.  Continue Cardizem , holding home dose of Avapro .   History of PE -On warfarin at home, CTA chest this admission with no evidence of PE.   GERD:   on PPI.   Hypokalemia, hypophosphatemia.  Replaced.     Chronic dysphagia.  Speech following.  Currently on dysphagia 3 diet.     Acquired hypothyroidism: Continue  Synthroid .   OSA: Continue with CPAP at bedtime with 2 L oxygen  bled in, husband will bring patient's machine   Anemia of chronic disease: Stable.   Obesity class 2.  BMI 38.  Follow-up with PCP.   Type 2 diabetes mellitus:   Not on any medications PTA.  Currently on SSI.     Discharge Diagnoses:  Principal Problem:   SOB (shortness of breath) Active Problems:   Effusive constrictive pericarditis   Respiratory distress   Acute on chronic hypoxic respiratory failure (HCC)   Acute on chronic diastolic (congestive) heart failure (HCC)   Pleural effusion   Protein-calorie malnutrition, severe   Pressure injury of skin    Discharge Instructions  Discharge Instructions     Discharge wound care:   Complete by: As directed    Wound care  Daily      Comments: Cleanse wounds to bilateral lower legs with VASHE and air dry.  Apply Aquacel to open wounds and cover with gauze.  Wrap legs with kerlix from below toes to below knee and secure with ace wraps.  Change Monday/Wednesday/Friday.  Cleanse perineal and buttocks areas with VASHE and air dry.  Apply Gerhardts butts paste twice daily.  No disposable underpads or briefs.  06/29/24 2018   Increase activity slowly   Complete by: As directed       Allergies as of 07/01/2024       Reactions   Atenolol Other (See Comments)   Depression   Citalopram Hydrobromide Nausea Only, Other (See Comments)   Nausea/Fatigue   Clindamycin /lincomycin Other (See Comments)   Via IV, has caused a metallic taste in the mouth    Codeine Other (See Comments)   Caused Hyperactivity and Dysphoria   Coenzyme Q10 Itching   Erythromycin Itching, Anxiety, Other (See Comments)   Also with jitters   Motrin  [ibuprofen ] Other (See Comments)   Is to not take this   Statins Other (See Comments)   Leg muscle weakness w/ rosuvastatin and simvastatin    Latex Rash   Losartan Potassium Other (See Comments)   Muscle aches        Medication List      STOP taking these medications    carvedilol  3.125 MG tablet Commonly known as: COREG    diltiazem  120 MG tablet Commonly known as: CARDIZEM    irbesartan  75 MG tablet Commonly known as: AVAPRO    nebivolol  5 MG tablet Commonly known as: BYSTOLIC    nystatin  cream Commonly known as: MYCOSTATIN    sodium chloride  1 g tablet   triamcinolone cream 0.1 % Commonly known as: KENALOG       TAKE these medications    acetaminophen  325 MG tablet Commonly known as: TYLENOL  Take 325-650 mg by mouth 2 (two) times daily as needed for mild pain or headache.   bisacodyl 10 MG suppository Commonly known as: DULCOLAX Place 10 mg rectally daily as needed for moderate constipation.   colchicine  0.6 MG tablet Take 1 tablet (0.6 mg total) by mouth 2 (two) times daily.   cyanocobalamin 1000 MCG tablet Commonly known as: VITAMIN B12 Take 1,000 mcg by mouth daily.   diltiazem  360 MG 24 hr capsule Commonly known as: CARDIZEM  CD Take 360 mg by mouth at bedtime.   ENEMA RE Place 1 Dose rectally daily as needed (constipation).   furosemide  40 MG tablet Commonly known as: LASIX  Take 1 tablet (40 mg total) by  mouth 2 (two) times daily. What changed: when to take this   guaiFENesin  600 MG 12 hr tablet Commonly known as: MUCINEX  Take 1 tablet (600 mg total) by mouth 2 (two) times daily.   lactose free nutrition Liqd Take 237 mLs by mouth 2 (two) times daily with a meal.   levothyroxine  112 MCG tablet Commonly known as: SYNTHROID  Take 112 mcg by mouth every morning.   LORazepam  0.5 MG tablet Commonly known as: ATIVAN  Take 1 tablet (0.5 mg total) by mouth every 12 (twelve) hours as needed for anxiety. What changed:  medication strength how much to take when to take this reasons to take this additional instructions   melatonin 5 MG Tabs Take 1 tablet (5 mg total) by mouth at bedtime as needed (if lorazepam  doesn't work). What changed: reasons to take this   Milk of Magnesia  1200 MG/15ML suspension Generic drug: magnesium  hydroxide Take 30 mLs by mouth daily as needed for mild constipation or moderate constipation.   pantoprazole  40 MG tablet Commonly known as: PROTONIX  Take 1 tablet (40 mg total) by mouth daily. Start taking on: July 02, 2024   polyethylene glycol 17 g packet Commonly known as: MIRALAX  / GLYCOLAX  Take 17 g by mouth daily as needed for mild constipation.   potassium chloride  SA 20 MEQ tablet Commonly known as: KLOR-CON  M Take 1 tablet (20 mEq total) by mouth daily. Start taking on: July 02, 2024   REFRESH LIQUIGEL OP Place 1 drop into both eyes 2 (two) times daily as needed (Dry eye).   senna-docusate 8.6-50 MG tablet Commonly known as: Senokot-S Take 1-2 tablets by mouth 2 (two) times daily between meals as needed for mild constipation or moderate constipation. What changed:  how much to take when to take this   SIMPLY SALINE NA Place 1 spray into both nostrils every 12 (twelve) hours as needed (for dryness).   spironolactone  25 MG tablet Commonly known as: ALDACTONE  Take 1 tablet (25 mg total) by mouth daily. Start taking on: July 02, 2024   warfarin 5 MG tablet Commonly known as: COUMADIN  Take 1 tablet (5 mg total) by mouth at bedtime. Start taking on: July 02, 2024               Discharge Care Instructions  (From admission, onward)           Start     Ordered   07/01/24 0000  Discharge wound care:       Comments: Wound care  Daily      Comments: Cleanse wounds to bilateral lower legs with VASHE and air dry.  Apply Aquacel to open wounds and cover with gauze.  Wrap legs with kerlix from below toes to below knee and secure with ace wraps.  Change Monday/Wednesday/Friday.  Cleanse perineal and buttocks areas with VASHE and air dry.  Apply Gerhardts butts paste twice daily.  No disposable underpads or briefs.  06/29/24 2018   07/01/24 1209            Contact information for after-discharge  care     Destination     [facility] .   Service: Skilled Nursing Contact information: [address] [place]  [address] [phone]                    Allergies[1]  Consultations: Cardiology, pulmonary, interventional radiology   Procedures/Studies: IR US  CHEST Result Date: 06/30/2024 INDICATION: 84 year old female with history of bilateral pleural  effusions. Request for right-sided thoracentesis. EXAM: CHEST ULTRASOUND COMPARISON:  Chest XR, 06/29/2024. FINDINGS: Limited US  performed of the right chest. No discernable pleural effusion identified. IMPRESSION: No RIGHT pleural effusion present on ultrasound. Thoracentesis was NOT performed on this side. Performed by: Kacie Matthews PA-C Electronically Signed   By: Thom Hall M.D.   On: 06/30/2024 16:58   IR THORACENTESIS LEFT ASP PLEURAL SPACE W/IMG GUIDE Result Date: 06/30/2024 INDICATION: 84 year old female with history of bilateral pleural effusions. Request made for left thoracentesis. EXAM: ULTRASOUND GUIDED LEFT THORACENTESIS MEDICATIONS: 10 ml 1% lidocaine  COMPLICATIONS: None immediate. PROCEDURE: An ultrasound guided thoracentesis was thoroughly discussed with the patient and questions answered. The benefits, risks, alternatives and complications were also discussed. The patient understands and wishes to proceed with the procedure. Written consent was obtained. Ultrasound was performed to localize and mark an adequate pocket of fluid in the left chest. The area was then prepped and draped in the normal sterile fashion. 1% Lidocaine  was used for local anesthesia. Under ultrasound guidance a 6 Fr Safe-T-Centesis catheter was introduced. Thoracentesis was performed. The catheter was removed and a dressing applied. FINDINGS: A total of approximately 350 mL of blood-tinged, amber fluid was removed. Samples were sent to the laboratory as requested by the clinical team. IMPRESSION: Successful ultrasound  guided LEFT thoracentesis yielding 350 mL of pleural fluid. Performed by: Kacie Matthews PA-C under supervision of Thom Hall, MD Electronically Signed   By: Thom Hall M.D.   On: 06/30/2024 16:55   DG Chest 1 View Result Date: 06/30/2024 CLINICAL DATA:  Post left-sided thoracentesis. EXAM: CHEST  1 VIEW COMPARISON:  06/29/2024 FINDINGS: Lungs are hypoinflated demonstrate interval improvement and patient's left-sided pleural effusion likely with associated left basilar atelectasis. No left-sided pneumothorax. Right lung is clear. Mild stable cardiomegaly. Remainder of the exam is unchanged. IMPRESSION: 1. Interval improvement in patient's left-sided pleural effusion likely with associated left basilar atelectasis. No pneumothorax. 2. Stable cardiomegaly. Electronically Signed   By: Toribio Agreste M.D.   On: 06/30/2024 09:39   DG Chest 2 View Result Date: 06/29/2024 CLINICAL DATA:  Pleural effusion. EXAM: CHEST - 2 VIEW COMPARISON:  06/25/2024 FINDINGS: Lungs are adequately inflated with moderate opacification over the left base/retrocardiac region with interval worsening likely effusion with associated compressive atelectasis. Stable cardiomegaly. Calcified hilar/mediastinal lymph nodes. Remainder of the exam is unchanged. IMPRESSION: 1. Moderate opacification over the left base/retrocardiac region with interval worsening likely effusion with associated compressive atelectasis. 2. Stable cardiomegaly. Electronically Signed   By: Toribio Agreste M.D.   On: 06/29/2024 16:29   ECHOCARDIOGRAM LIMITED Result Date: 06/26/2024    ECHOCARDIOGRAM LIMITED REPORT   Patient Name:   MARGURITE DUFFY Date of Exam: 06/26/2024 Medical Rec #:  [id number]            Height:       64.0 in Accession #:    [id number]           Weight:       225.0 lb Date of Birth:  03-09-1941            BSA:          2.057 m Patient Age:    83 years             BP:           119/53 mmHg Patient Gender: F                    HR:  75  bpm. Exam Location:  Inpatient Procedure: Limited Echo, Cardiac Doppler and Limited Color Doppler (Both            Spectral and Color Flow Doppler were utilized during procedure). Indications:     Pericardial Effusion I31.3  History:         Patient has prior history of Echocardiogram examinations, most                  recent 06/21/2024. Pericardial Effusion; Risk Factors:Sleep                  Apnea and Hypertension.  Sonographer:     Koleen Popper RDCS Referring Phys:  DARRYLE NED O'NEAL Diagnosing Phys: Darryle Decent MD  Sonographer Comments: Image acquisition challenging due to patient body habitus. IMPRESSIONS  1. Small pericardial effusion posterior to the LV. Poorly interrogated on this study. IVC dilated without collapse. Respiraophasic septal shift noted, indicative of increased ventricular interdependence. a small pericardial effusion is present. The pericardial effusion is posterior to the left ventricle. There is no evidence of cardiac tamponade.  2. Left ventricular ejection fraction, by estimation, is 55 to 60%. The left ventricle has normal function. Left ventricular endocardial border not optimally defined to evaluate regional wall motion. Left ventricular diastolic function could not be evaluated.  3. Right ventricular systolic function is mildly reduced. The right ventricular size is moderately enlarged.  4. Left atrial size was severely dilated.  5. Right atrial size was severely dilated.  6. The mitral valve is grossly normal. Trivial mitral valve regurgitation. No evidence of mitral stenosis.  7. The inferior vena cava is dilated in size with <50% respiratory variability, suggesting right atrial pressure of 15 mmHg. Comparison(s): No significant change from prior study. FINDINGS  Left Ventricle: Left ventricular ejection fraction, by estimation, is 55 to 60%. The left ventricle has normal function. Left ventricular endocardial border not optimally defined to evaluate regional wall motion. The  left ventricular internal cavity size was normal in size. There is no left ventricular hypertrophy. Left ventricular diastolic function could not be evaluated. Left ventricular diastolic function could not be evaluated due to atrial fibrillation. Right Ventricle: The right ventricular size is moderately enlarged. No increase in right ventricular wall thickness. Right ventricular systolic function is mildly reduced. Left Atrium: Left atrial size was severely dilated. Right Atrium: Right atrial size was severely dilated. Pericardium: Small pericardial effusion posterior to the LV. Poorly interrogated on this study. IVC dilated without collapse. Respiraophasic septal shift noted, indicative of increased ventricular interdependence. A small pericardial effusion is present.  The pericardial effusion is posterior to the left ventricle. There is no evidence of cardiac tamponade. Mitral Valve: The mitral valve is grossly normal. Trivial mitral valve regurgitation. No evidence of mitral valve stenosis. Aorta: The aortic root and ascending aorta are structurally normal, with no evidence of dilitation. Venous: The inferior vena cava is dilated in size with less than 50% respiratory variability, suggesting right atrial pressure of 15 mmHg. IAS/Shunts: The atrial septum is grossly normal. Additional Comments: Spectral Doppler performed. Color Doppler performed.  LEFT VENTRICLE PLAX 2D LVIDd:         4.70 cm LVIDs:         3.50 cm LV PW:         1.00 cm LV IVS:        1.20 cm LVOT diam:     2.00 cm LV SV:         71 LV SV  Index:   34 LVOT Area:     3.14 cm  RIGHT VENTRICLE             IVC RV S prime:     11.90 cm/s  IVC diam: 2.50 cm TAPSE (M-mode): 1.3 cm LEFT ATRIUM           Index LA diam:      4.40 cm 2.14 cm/m LA Vol (A2C): 72.0 ml 35.01 ml/m  AORTIC VALVE LVOT Vmax:   133.00 cm/s LVOT Vmean:  86.800 cm/s LVOT VTI:    0.225 m  AORTA Ao Root diam: 2.80 cm Ao Asc diam:  3.40 cm MITRAL VALVE MV Area (PHT): 5.66 cm      SHUNTS MV Decel Time: 134 msec     Systemic VTI:  0.22 m MR Peak grad: 62.4 mmHg     Systemic Diam: 2.00 cm MR Vmax:      395.00 cm/s MV E velocity: 138.00 cm/s MV A velocity: 36.90 cm/s MV E/A ratio:  3.74 Darryle Decent MD Electronically signed by Darryle Decent MD Signature Date/Time: 06/26/2024/3:15:19 PM    Final (Updated)    DG CHEST PORT 1 VIEW Result Date: 06/25/2024 EXAM: 1 VIEW(S) XRAY OF THE CHEST 06/25/2024 12:02:00 PM COMPARISON: 06/25/2024 CLINICAL HISTORY: S/P thoracentesis. FINDINGS: LUNGS AND PLEURA: Decreased left pleural effusion. Improved aeration of left lung base. Interstitial opacities, likely pulmonary edema. No pneumothorax. HEART AND MEDIASTINUM: Unchanged cardiomegaly. Tortuous aorta with aortic atherosclerosis. Calcified mediastinal lymph nodes. BONES AND SOFT TISSUES: Multilevel thoracic osteophytosis. IMPRESSION: 1. Decreased left pleural effusion and improved aeration of the left lung base. No pneumothorax identified following thoracentesis. 2. Interstitial opacities, likely pulmonary edema. 3. Unchanged cardiomegaly. Electronically signed by: Waddell Calk MD 06/25/2024 12:22 PM EST RP Workstation: [id number]   DG Chest Port 1 View Result Date: 06/25/2024 EXAM: 1 VIEW XRAY OF THE CHEST 06/25/2024 06:44:00 AM COMPARISON: Portable chest and CTA chest, both dated 06/23/2024. CLINICAL HISTORY: SOB (shortness of breath) FINDINGS: LUNGS AND PLEURA: Left greater than right pleural effusions are noted. Patchy consolidation or atelectasis is present in the left greater than right lower lung fields. The remaining lungs appear clear. No pneumothorax. Overall aeration seems unchanged. HEART AND MEDIASTINUM: The heart is enlarged. There is central vascular prominence without appreciable edema. Mediastinum is stable with aortic atherosclerosis. BONES AND SOFT TISSUES: Thoracic spondylosis with no new osseous findings. IMPRESSION: 1. Left greater than right pleural effusions with associated  patchy consolidation or atelectasis in the left greater than right lower lung fields. Stable overall aeration. 2. Enlarged heart with central vascular prominence without appreciable edema. Electronically signed by: Francis Quam MD 06/25/2024 07:21 AM EST RP Workstation: [id number]   DG ESOPHAGUS W SINGLE CM (SOL OR THIN BA) Result Date: 06/24/2024 CLINICAL DATA:  84 year old female recently admitted with septic shock from staph bacteremia, readmitted 1 day after discharge with acute on chronic respiratory and heart failure, and pericardial and pleural effusions. Possible aspiration pneumonia. Patient is noted with difficulty swallowing liquids, solids, and pills. EXAM: ESOPHAGUS/BARIUM SWALLOW/TABLET STUDY TECHNIQUE: Single contrast examination was performed using thin liquid barium. This exam was performed by Carlin Griffon, PA-C, and was supervised and interpreted by Dr. Ryan Salvage, MD. FLUOROSCOPY: Radiation Exposure Index (as provided by the fluoroscopic device): 58.10 mGy Kerma COMPARISON:  None Available. FINDINGS: Swallowing: Appears normal. No vestibular penetration or aspiration seen. Pharynx: Unremarkable. Esophagus: Likely mild dilation of proximal esophagus. Esophageal motility: Moderate to severe dysmotility with poor primary peristalsis, stasis of barium and esophagus despite  table tilt, notable proximal escape with regurgitation (series 5, image 73/83), and poor emptying at LES into gastric lumen. Hiatal Hernia: None noted. Gastroesophageal reflux: None visualized despite provocative maneuvers (series 6). Ingested 13mm barium tablet: Became stuck longer than 5 minutes. Other: Study is severely limited by patient's inability to position adequately for study. Only AP imaging was possible. The entirety of the study was performed at 20 degrees tilt. IMPRESSION: Moderate to severe esophageal dysmotility, Inability of barium tablet to pass through the distal esophagus. Consider upper endoscopy to  evaluate for benign or malignant stricturing. No lesion appreciated on CT 1 day prior. Electronically Signed   By: Jackquline Boxer M.D.   On: 06/24/2024 14:19   CT Angio Chest Pulmonary Embolism (PE) W or WO Contrast Result Date: 06/23/2024 CLINICAL DATA:  Possible pulmonary embolism. Recent left thoracentesis. EXAM: CT ANGIOGRAPHY CHEST WITH CONTRAST TECHNIQUE: Multidetector CT imaging of the chest was performed using the standard protocol during bolus administration of intravenous contrast. Multiplanar CT image reconstructions and MIPs were obtained to evaluate the vascular anatomy. RADIATION DOSE REDUCTION: This exam was performed according to the departmental dose-optimization program which includes automated exposure control, adjustment of the mA and/or kV according to patient size and/or use of iterative reconstruction technique. CONTRAST:  75mL OMNIPAQUE  IOHEXOL  350 MG/ML SOLN COMPARISON:  CT 06/10/2024 FINDINGS: Cardiovascular: Mild stable cardiomegaly with interval decrease in pericardial effusion. Mild calcification over the left main and 3 vessel coronary arteries. Thoracic aorta is normal caliber. Mild calcified plaque over the descending thoracic aorta. Pulmonary arterial system is well opacified without evidence of emboli. Remaining vascular structures are unremarkable. Mediastinum/Nodes: No significant adenopathy over the hilar mediastinal regions. There are multiple calcified mediastinal and hilar lymph nodes. Remaining mediastinal structures are unremarkable. Lungs/Pleura: Lungs are adequately inflated. There is a small left pleural effusion with associated basilar atelectasis. There is a small to moderate size right pleural effusion with associated basilar atelectasis. No pneumothorax. Bilateral calcified granulomas are present. Mild atelectatic change over the lingula and dependent left upper lobe. Subtle hazy density over the perihilar region of the left upper lobe possibly due to minimal  asymmetric edema and less likely early infection. Mild tracheobronchomalacia new compared to the prior exam as this is transient. Upper Abdomen: Mild calcified plaque over the abdominal aorta. No acute findings of the visualized upper abdominal structures. Musculoskeletal: No focal abnormality. Review of the MIP images confirms the above findings. IMPRESSION: 1. No evidence of pulmonary embolism. 2. Small to moderate size right pleural effusion with associated basilar atelectasis. Small left pleural effusion with associated basilar atelectasis. 3. Subtle hazy density over the perihilar region of the left upper lobe possibly due to minimal asymmetric edema and less likely early infection. 4. Mild stable cardiomegaly with interval decrease in pericardial effusion. Atherosclerotic coronary artery disease. 5. Aortic atherosclerosis. 6. Evidence of old granulomatous disease. Aortic Atherosclerosis (ICD10-I70.0). Electronically Signed   By: Toribio Agreste M.D.   On: 06/23/2024 10:04   DG Chest 1 View Result Date: 06/23/2024 EXAM: 1 VIEW(S) XRAY OF THE CHEST 06/23/2024 09:34:00 AM COMPARISON: 06/23/2024 CLINICAL HISTORY: Pleural effusion on left. FINDINGS: LUNGS AND PLEURA: Bibasilar homogeneous opacities. Mild pulmonary edema. Significant interval decrease in left pleural effusion, now small. Small right pleural effusion. No pneumothorax. HEART AND MEDIASTINUM: Cardiomegaly. Aortic arch atherosclerosis. Calcified mediastinal and hilar lymph nodes. BONES AND SOFT TISSUES: No acute osseous abnormality. IMPRESSION: 1. Significant interval decrease in left pleural effusion, now small. 2. Small right pleural effusion. 3. Cardiomegaly with mild  pulmonary edema and bibasilar homogeneous opacities; aortic arch atherosclerosis and calcified mediastinal/hilar lymph nodes. Electronically signed by: Evalene Coho MD 06/23/2024 09:38 AM EST RP Workstation: [id number]   IR THORACENTESIS LEFT ASP PLEURAL SPACE W/IMG GUIDE Result  Date: 06/23/2024 INDICATION: 84 year old female with a PMHx of CHF and sarcoidosis with a large left sided pleural effusion. IR received request for diagnostic and therapeutic left thoracentesis. This is patients first thoracentesis. EXAM: ULTRASOUND GUIDED DIAGNOSTIC AND THERAPEUTIC LEFT THORACENTESIS MEDICATIONS: 9mL of 1% Lidocaine  COMPLICATIONS: None immediate. PROCEDURE: An ultrasound guided thoracentesis was thoroughly discussed with the patient and questions answered. The benefits, risks, alternatives and complications were also discussed. The patient understands and wishes to proceed with the procedure. Written consent was obtained. Ultrasound was performed to localize and mark an adequate pocket of fluid in the left chest. The area was then prepped and draped in the normal sterile fashion. 1% Lidocaine  was used for local anesthesia. Under ultrasound guidance a 6 Fr Safe-T-Centesis catheter was introduced. Thoracentesis was performed. The catheter was removed and a dressing applied. FINDINGS: A total of approximately of blood tinged fluid was removed. Samples were sent to the laboratory as requested by the clinical team. IMPRESSION: Successful ultrasound guided left thoracentesis yielding 900 mL of pleural fluid. Performed by Thersia Rummer, PA-C Electronically Signed   By: Marcey Moan M.D.   On: 06/23/2024 09:23   DG Chest Port 1 View Result Date: 06/23/2024 EXAM: 1 VIEW(S) XRAY OF THE CHEST 06/23/2024 06:30:00 AM COMPARISON: 06/22/2024 CLINICAL HISTORY: SOB (shortness of breath) FINDINGS: LUNGS AND PLEURA: Large left pleural effusion, likely increased. Small right pleural effusion. Bibasilar homogeneous opacities. Mild pulmonary edema. No pneumothorax. HEART AND MEDIASTINUM: Cardiomegaly. Aortic arch atherosclerosis. Calcified mediastinal and hilar lymph nodes. BONES AND SOFT TISSUES: No acute osseous abnormality. IMPRESSION: 1. Large left pleural effusion, likely increased, and small right  pleural effusion. 2. Bibasilar homogeneous opacities and mild pulmonary edema. 3. Cardiomegaly, aortic arch atherosclerosis, and calcified mediastinal and hilar lymph nodes. Electronically signed by: Evalene Coho MD 06/23/2024 06:37 AM EST RP Workstation: [id number]   DG CHEST PORT 1 VIEW Result Date: 06/22/2024 EXAM: 1 VIEW(S) XRAY OF THE CHEST 06/22/2024 10:32:00 PM COMPARISON: 06/22/2024 CLINICAL HISTORY: SOB (shortness of breath) FINDINGS: LUNGS AND PLEURA: Hazy opacification of left lung. Large left-sided pleural effusion is noted. Moderate right effusion is seen. Central vascular congestion is noted. These changes have worsened in the interval from the prior exam, although they may be positional in nature. No pneumothorax. HEART AND MEDIASTINUM: Cardiomegaly. Calcified mediastinal and hilar lymph nodes. BONES AND SOFT TISSUES: No acute osseous abnormality. IMPRESSION: 1. Worsening hazy opacification of the left lung with large left pleural effusion and moderate right pleural effusion 2. Cardiomegaly with central vascular congestion. Electronically signed by: Oneil Devonshire MD 06/22/2024 10:43 PM EST RP Workstation: [id number]   MR CARDIAC MORPHOLOGY W WO CONTRAST Result Date: 06/22/2024 CLINICAL DATA:  Clinical question of constrictive pericarditis. Study assumes HCT of 33 and BSA of 2.15 m2. EXAM: CARDIAC MRI TECHNIQUE: The patient was scanned on a 1.5 Tesla GE magnet. A dedicated cardiac coil was used. Functional imaging was done using Fiesta sequences. 2,3, and 4 chamber views were done to assess for RWMA's. Modified Simpson's rule using was used to calculate an ejection fraction on a dedicated work [employer]. The patient received 10 cc of Gadavist . After 10 minutes inversion recovery sequences were used to assess for infiltration and scar tissue. Flow quantification was performed 2 times during  this examination with flow quantification performed at the levels of the ascending  aorta above the valve, pulmonary artery above the valve. CONTRAST:  10 cc  of Gadavist  FINDINGS: 1. Normal left ventricular size, with LVEDD 54 mm, and LVEDVi 55 mL/m2. Normal left ventricular thickness, with intraventricular septal thickness of 6 mm, posterior wall thickness of 5 mm, and myocardial mass index of 49 g/m2. Normal left ventricular systolic function (LVEF =61%). There are no regional wall motion abnormalities. Left ventricular parametric mapping (1.5 T, MOLLI) notable for mild T2 increase (57 ms) and increase in ECV 38%. There is late gadolinium enhancement in the left ventricular myocardium- inferior insertion point LGE. Normal resting perfusion. 2. Normal right ventricular size with RVEDVI 69 mL/m2. Normal right ventricular thickness. Decreased right ventricular systolic function (RVEF =38%). There are no regional wall motion abnormalities or aneurysms, septal hypokinesis. Preserved basal excursion. 3. Bi-atrial dilation. IVC dilation 25 mm. No IVC collapse see on exam. QP/Qs is 1.13 but with no visualized shunting. 4. Normal size of the aortic root, ascending aorta. Moderate dilation of the main pulmonary artery, 31 mm. 5. Valve assessment: Aortic Valve: Tri-leaflet, no significant stenosis or regurgitation. Mean gradient 2 mm Hg. Aortic regurgitant fraction <1 %. Pulmonic Valve:Normal morphology. No significant stenosis or regurgitation. Regurgitant fraction < 1% Tricuspid Valve:Tri-leaflet, qualitatively mild tricuspid regurgitation. Mitral Valve: Normal morphology. No significant stenosis or prolapse. Mitral regurgitant volume 3 mL, regurgitant fraction 4% 6. Normal pericardial thickness. No pericardial hyper-enhancement. Normal slippage on tagging sequences. There is a D shaped septum seen in inspiration that is not seen during expiratory. A septal bounce is seen on strain imaging. Moderate pericardial effusion. Circumferential with anterior predominance. 7. There are bilateral pleural effusions  with secondary atelectasis. There are hilar and mediastinal lymph nodes greater than 1 cm. Recommended dedicated study if clinically indicated. 8. Free breathing artifacts noted. This decreased the sensitivity of RV quantification. IMPRESSION: 1. There is a moderate pericardial effusion. The inferior vena cava is dilated with reduced respiratory collapse, consistent with elevated right sided filling pressures. In diastole, the left ventricle has a D shaped configuration with accentuated respiratory variation in septal position (septal bounce) on strain imaging and with free breathing, indicating ventricular interdependence. This is seen during inspiration and can be seen in constrictive physiology. 2. Normal left ventricular systolic function (LVEF =61%). 3. Decreased right ventricular systolic function (RVEF =38%). 4. There are bilateral pleural effusions with secondary atelectasis. Reviewed with Drs. Patwardhan and Pahwani. Stanly Leavens MD Electronically Signed   By: Stanly Leavens M.D.   On: 06/22/2024 13:50   MR CARDIAC VELOCITY FLOW MAP Result Date: 06/22/2024 CLINICAL DATA:  Clinical question of constrictive pericarditis. Study assumes HCT of 33 and BSA of 2.15 m2. EXAM: CARDIAC MRI TECHNIQUE: The patient was scanned on a 1.5 Tesla GE magnet. A dedicated cardiac coil was used. Functional imaging was done using Fiesta sequences. 2,3, and 4 chamber views were done to assess for RWMA's. Modified Simpson's rule using was used to calculate an ejection fraction on a dedicated work [employer]. The patient received 10 cc of Gadavist . After 10 minutes inversion recovery sequences were used to assess for infiltration and scar tissue. Flow quantification was performed 2 times during this examination with flow quantification performed at the levels of the ascending aorta above the valve, pulmonary artery above the valve. CONTRAST:  10 cc  of Gadavist  FINDINGS: 1. Normal left  ventricular size, with LVEDD 54 mm, and LVEDVi 55 mL/m2. Normal  left ventricular thickness, with intraventricular septal thickness of 6 mm, posterior wall thickness of 5 mm, and myocardial mass index of 49 g/m2. Normal left ventricular systolic function (LVEF =61%). There are no regional wall motion abnormalities. Left ventricular parametric mapping (1.5 T, MOLLI) notable for mild T2 increase (57 ms) and increase in ECV 38%. There is late gadolinium enhancement in the left ventricular myocardium- inferior insertion point LGE. Normal resting perfusion. 2. Normal right ventricular size with RVEDVI 69 mL/m2. Normal right ventricular thickness. Decreased right ventricular systolic function (RVEF =38%). There are no regional wall motion abnormalities or aneurysms, septal hypokinesis. Preserved basal excursion. 3. Bi-atrial dilation. IVC dilation 25 mm. No IVC collapse see on exam. QP/Qs is 1.13 but with no visualized shunting. 4. Normal size of the aortic root, ascending aorta. Moderate dilation of the main pulmonary artery, 31 mm. 5. Valve assessment: Aortic Valve: Tri-leaflet, no significant stenosis or regurgitation. Mean gradient 2 mm Hg. Aortic regurgitant fraction <1 %. Pulmonic Valve:Normal morphology. No significant stenosis or regurgitation. Regurgitant fraction < 1% Tricuspid Valve:Tri-leaflet, qualitatively mild tricuspid regurgitation. Mitral Valve: Normal morphology. No significant stenosis or prolapse. Mitral regurgitant volume 3 mL, regurgitant fraction 4% 6. Normal pericardial thickness. No pericardial hyper-enhancement. Normal slippage on tagging sequences. There is a D shaped septum seen in inspiration that is not seen during expiratory. A septal bounce is seen on strain imaging. Moderate pericardial effusion. Circumferential with anterior predominance. 7. There are bilateral pleural effusions with secondary atelectasis. There are hilar and mediastinal lymph nodes greater than 1 cm. Recommended  dedicated study if clinically indicated. 8. Free breathing artifacts noted. This decreased the sensitivity of RV quantification. IMPRESSION: 1. There is a moderate pericardial effusion. The inferior vena cava is dilated with reduced respiratory collapse, consistent with elevated right sided filling pressures. In diastole, the left ventricle has a D shaped configuration with accentuated respiratory variation in septal position (septal bounce) on strain imaging and with free breathing, indicating ventricular interdependence. This is seen during inspiration and can be seen in constrictive physiology. 2. Normal left ventricular systolic function (LVEF =61%). 3. Decreased right ventricular systolic function (RVEF =38%). 4. There are bilateral pleural effusions with secondary atelectasis. Reviewed with Drs. Patwardhan and Pahwani. Stanly Leavens MD Electronically Signed   By: Stanly Leavens M.D.   On: 06/22/2024 13:50   MR CARDIAC VELOCITY FLOW MAP Result Date: 06/22/2024 CLINICAL DATA:  Clinical question of constrictive pericarditis. Study assumes HCT of 33 and BSA of 2.15 m2. EXAM: CARDIAC MRI TECHNIQUE: The patient was scanned on a 1.5 Tesla GE magnet. A dedicated cardiac coil was used. Functional imaging was done using Fiesta sequences. 2,3, and 4 chamber views were done to assess for RWMA's. Modified Simpson's rule using was used to calculate an ejection fraction on a dedicated work [employer]. The patient received 10 cc of Gadavist . After 10 minutes inversion recovery sequences were used to assess for infiltration and scar tissue. Flow quantification was performed 2 times during this examination with flow quantification performed at the levels of the ascending aorta above the valve, pulmonary artery above the valve. CONTRAST:  10 cc  of Gadavist  FINDINGS: 1. Normal left ventricular size, with LVEDD 54 mm, and LVEDVi 55 mL/m2. Normal left ventricular thickness, with  intraventricular septal thickness of 6 mm, posterior wall thickness of 5 mm, and myocardial mass index of 49 g/m2. Normal left ventricular systolic function (LVEF =61%). There are no regional wall motion abnormalities. Left ventricular parametric mapping (1.5 T, MOLLI)  notable for mild T2 increase (57 ms) and increase in ECV 38%. There is late gadolinium enhancement in the left ventricular myocardium- inferior insertion point LGE. Normal resting perfusion. 2. Normal right ventricular size with RVEDVI 69 mL/m2. Normal right ventricular thickness. Decreased right ventricular systolic function (RVEF =38%). There are no regional wall motion abnormalities or aneurysms, septal hypokinesis. Preserved basal excursion. 3. Bi-atrial dilation. IVC dilation 25 mm. No IVC collapse see on exam. QP/Qs is 1.13 but with no visualized shunting. 4. Normal size of the aortic root, ascending aorta. Moderate dilation of the main pulmonary artery, 31 mm. 5. Valve assessment: Aortic Valve: Tri-leaflet, no significant stenosis or regurgitation. Mean gradient 2 mm Hg. Aortic regurgitant fraction <1 %. Pulmonic Valve:Normal morphology. No significant stenosis or regurgitation. Regurgitant fraction < 1% Tricuspid Valve:Tri-leaflet, qualitatively mild tricuspid regurgitation. Mitral Valve: Normal morphology. No significant stenosis or prolapse. Mitral regurgitant volume 3 mL, regurgitant fraction 4% 6. Normal pericardial thickness. No pericardial hyper-enhancement. Normal slippage on tagging sequences. There is a D shaped septum seen in inspiration that is not seen during expiratory. A septal bounce is seen on strain imaging. Moderate pericardial effusion. Circumferential with anterior predominance. 7. There are bilateral pleural effusions with secondary atelectasis. There are hilar and mediastinal lymph nodes greater than 1 cm. Recommended dedicated study if clinically indicated. 8. Free breathing artifacts noted. This decreased the  sensitivity of RV quantification. IMPRESSION: 1. There is a moderate pericardial effusion. The inferior vena cava is dilated with reduced respiratory collapse, consistent with elevated right sided filling pressures. In diastole, the left ventricle has a D shaped configuration with accentuated respiratory variation in septal position (septal bounce) on strain imaging and with free breathing, indicating ventricular interdependence. This is seen during inspiration and can be seen in constrictive physiology. 2. Normal left ventricular systolic function (LVEF =61%). 3. Decreased right ventricular systolic function (RVEF =38%). 4. There are bilateral pleural effusions with secondary atelectasis. Reviewed with Drs. Patwardhan and Pahwani. Stanly Leavens MD Electronically Signed   By: Stanly Leavens M.D.   On: 06/22/2024 13:50   DG CHEST PORT 1 VIEW Result Date: 06/22/2024 CLINICAL DATA:  Pleural effusion.  Shortness of breath. EXAM: PORTABLE CHEST 1 VIEW COMPARISON:  06/21/2024 FINDINGS: Lungs are hypoinflated demonstrate continued evidence of moderate to large left effusion likely with associated compressive atelectasis in the left base. Stable hazy opacification over the right base. Underlying pneumonia is possible as suggested on recent CT. Mild stable cardiomegaly. Remainder of the exam is unchanged. IMPRESSION: 1. Stable moderate to large left effusion likely with associated compressive atelectasis in the left base. Stable hazy opacification over the right base. Underlying pneumonia in the lung bases is possible. 2. Stable cardiomegaly. Electronically Signed   By: Toribio Agreste M.D.   On: 06/22/2024 13:42   ECHOCARDIOGRAM LIMITED Result Date: 06/21/2024    ECHOCARDIOGRAM LIMITED REPORT   Patient Name:   LUTHER NEWHOUSE Staples Date of Exam: 06/21/2024 Medical Rec #:  [id number]            Height:       64.0 in Accession #:    [id number]           Weight:       239.6 lb Date of Birth:  1941-02-19             BSA:          2.113 m Patient Age:    57 years  BP:           144/74 mmHg Patient Gender: F                    HR:           94 bpm. Exam Location:  Inpatient Procedure: Limited Echo, Cardiac Doppler and Color Doppler (Both Spectral and            Color Flow Doppler were utilized during procedure). Indications:     Pericardial Effusion I31.3  History:         Patient has prior history of Echocardiogram examinations, most                  recent 06/14/2024. Pericardial Effusion; Risk Factors:Sleep Apnea                  and Hypertension.  Sonographer:     Koleen Popper RDCS Referring Phys:  [id number] KEVEN FILA Diagnosing Phys: Darryle Decent MD  Sonographer Comments: Image acquisition challenging due to patient body habitus. IMPRESSIONS  1. Moderate pericardial effusion which is more prominent up to 2.0 cm posterior to the LV (best seen subcostal; image 49). The RV is poorly visualized, but does not appear to collapse in diastole. The IVC is dilated. There is evidence of respiratory variation in septal movement on image 41. Findings are suggestive of a clinically significant pericardial effusion. Clinical correlation is recommended. Appearance is stable compared to prior study. Moderate pericardial effusion. The pericardial effusion  is circumferential.  2. Right ventricular systolic function was not well visualized. The right ventricular size is not well visualized. Tricuspid regurgitation signal is inadequate for assessing PA pressure.  3. The mitral valve is grossly normal. Trivial mitral valve regurgitation. No evidence of mitral stenosis.  4. The aortic valve is tricuspid. Aortic valve regurgitation is not visualized. Aortic valve sclerosis is present, with no evidence of aortic valve stenosis.  5. The inferior vena cava is dilated in size with <50% respiratory variability, suggesting right atrial pressure of 15 mmHg.  6. Left ventricular ejection fraction, by estimation, is 60 to 65%. The left  ventricle has normal function. Left ventricular endocardial border not optimally defined to evaluate regional wall motion. Comparison(s): No significant change from prior study. FINDINGS  Left Ventricle: Left ventricular ejection fraction, by estimation, is 60 to 65%. The left ventricle has normal function. Left ventricular endocardial border not optimally defined to evaluate regional wall motion. The left ventricular internal cavity size was normal in size. There is no left ventricular hypertrophy. Right Ventricle: The right ventricular size is not well visualized. Right vetricular wall thickness was not well visualized. Right ventricular systolic function was not well visualized. Tricuspid regurgitation signal is inadequate for assessing PA pressure. Left Atrium: Left atrial size was normal in size. Right Atrium: Right atrial size was normal in size. Pericardium: Moderate pericardial effusion which is more prominent up to 2.0 cm posterior to the LV (best seen subcostal; image 49). The RV is poorly visualized, but does not appear to collapse in diastole. The IVC is dilated. There is evidence of respiratory variation in septal movement on image 41. Findings are suggestive of a clinically significant pericardial effusion. Clinical correlation is recommended. Appearance is stable compared to prior study. A moderately sized pericardial effusion is present. The pericardial effusion is circumferential. Presence of epicardial fat layer. Mitral Valve: The mitral valve is grossly normal. Trivial mitral valve regurgitation. No evidence of mitral valve stenosis. Aortic Valve: The aortic  valve is tricuspid. Aortic valve regurgitation is not visualized. Aortic valve sclerosis is present, with no evidence of aortic valve stenosis. Pulmonic Valve: The pulmonic valve was grossly normal. Pulmonic valve regurgitation is trivial. No evidence of pulmonic stenosis. Aorta: The aortic root and ascending aorta are structurally normal,  with no evidence of dilitation. Venous: The inferior vena cava is dilated in size with less than 50% respiratory variability, suggesting right atrial pressure of 15 mmHg. IAS/Shunts: The atrial septum is grossly normal. Additional Comments: Spectral Doppler performed. Color Doppler performed.  LEFT VENTRICLE PLAX 2D LVIDd:         4.60 cm LVIDs:         3.30 cm LV PW:         0.90 cm LV IVS:        1.10 cm LVOT diam:     2.00 cm LV SV:         58 LV SV Index:   28 LVOT Area:     3.14 cm  LV Volumes (MOD) LV vol d, MOD A4C: 122.0 ml LV vol s, MOD A4C: 44.8 ml LV SV MOD A4C:     122.0 ml IVC IVC diam: 2.40 cm LEFT ATRIUM           Index LA diam:      3.90 cm 1.85 cm/m LA Vol (A4C): 52.0 ml 24.61 ml/m  AORTIC VALVE LVOT Vmax:   103.00 cm/s LVOT Vmean:  67.600 cm/s LVOT VTI:    0.185 m  AORTA Ao Root diam: 3.10 cm Ao Asc diam:  3.30 cm  SHUNTS Systemic VTI:  0.18 m Systemic Diam: 2.00 cm Darryle Decent MD Electronically signed by Darryle Decent MD Signature Date/Time: 06/21/2024/6:21:03 PM    Final (Updated)    DG Chest Port 1 View Result Date: 06/21/2024 CLINICAL DATA:  Shortness of breath. EXAM: PORTABLE CHEST 1 VIEW COMPARISON:  06/10/2024, CT 06/10/2024 FINDINGS: Lungs are adequately inflated demonstrate moderate opacification over the lower half of the left hemithorax likely large effusion with associated basilar compressive atelectasis. Mild hazy density over the right base possibly small layering effusion with atelectasis. Mild hazy opacification of the perihilar markings suggesting mild degree of vascular congestion. Multiple calcified hilar/mediastinal lymph nodes. Stable cardiomegaly. Remainder of the exam is unchanged. IMPRESSION: 1. Moderate opacification over the lower half of the left hemithorax likely large effusion with associated basilar compressive atelectasis. Possible small layering right effusion with atelectasis. 2. Stable cardiomegaly with suggestion of mild vascular congestion. Electronically  Signed   By: Toribio Agreste M.D.   On: 06/21/2024 13:09   ECHOCARDIOGRAM LIMITED Result Date: 06/14/2024    ECHOCARDIOGRAM LIMITED REPORT   Patient Name:   JAMISEN HAWES Frame Date of Exam: 06/14/2024 Medical Rec #:  [id number]            Height:       64.0 in Accession #:    [id number]           Weight:       237.4 lb Date of Birth:  [birth year]            BSA:          2.104 m Patient Age:    83 years             BP:           118/68 mmHg Patient Gender: F  HR:           79 bpm. Exam Location:  Inpatient Procedure: Limited Echo, Cardiac Doppler and Color Doppler (Both Spectral and            Color Flow Doppler were utilized during procedure). Indications:    Pericardial Effusion  History:        Patient has prior history of Echocardiogram examinations, most                 recent 06/10/2024. Signs/Symptoms:Bacteremia; Risk                 Factors:Hypertension and Sleep Apnea.  Sonographer:    Philomena Daring Referring Phys: [id number] BRIAN S CRENSHAW IMPRESSIONS  1. Left ventricular ejection fraction, by estimation, is 50%. The left ventricle has mildly decreased function.  2. Right ventricular systolic function is mildly reduced. The right ventricular size is normal.  3. Moderate pericardial effusion. The pericardial effusion is posterior to the left ventricle.  4. The mitral valve is normal in structure. No evidence of mitral valve regurgitation. No evidence of mitral stenosis.  5. Aortic valve regurgitation is not visualized.  6. The inferior vena cava is dilated in size with <50% respiratory variability, suggesting right atrial pressure of 20 mmHg. Comparison(s): Prior images reviewed side by side. Pericardial effusion has improved. IVC plethora remains; respirophasic variability not optimized in this study. FINDINGS  Left Ventricle: Left ventricular ejection fraction, by estimation, is 50%. The left ventricle has mildly decreased function. Right Ventricle: The right ventricular size is normal. Right  ventricular systolic function is mildly reduced. Pericardium: A moderately sized pericardial effusion is present. The pericardial effusion is posterior to the left ventricle. Mitral Valve: The mitral valve is normal in structure. No evidence of mitral valve stenosis. Aortic Valve: Aortic valve regurgitation is not visualized. Pulmonic Valve: The pulmonic valve was not well visualized. Pulmonic valve regurgitation is not visualized. No evidence of pulmonic stenosis. Venous: The inferior vena cava is dilated in size with less than 50% respiratory variability, suggesting right atrial pressure of 15 mmHg. Additional Comments: Spectral Doppler performed. Color Doppler performed.  IVC IVC diam: 2.70 cm Stanly Leavens MD Electronically signed by Stanly Leavens MD Signature Date/Time: 06/14/2024/2:13:41 PM    Final    ECHOCARDIOGRAM COMPLETE Result Date: 06/10/2024    ECHOCARDIOGRAM REPORT   Patient Name:   NAYELIZ HIPP Miltner Date of Exam: 06/10/2024 Medical Rec #:  [id number]            Height:       64.0 in Accession #:    [id number]           Weight:       241.0 lb Date of Birth:  29-Apr-1941            BSA:          2.118 m Patient Age:    83 years             BP:           91/65 mmHg Patient Gender: F                    HR:           101 bpm. Exam Location:  Inpatient Procedure: 2D Echo, Cardiac Doppler and Color Doppler (Both Spectral and Color            Flow Doppler were utilized during procedure). REPORT CONTAINS CRITICAL RESULT Indications:    I31.3  Pericardial effusion (noninflammatory)  History:        Patient has prior history of Echocardiogram examinations, most                 recent 05/13/2024. Abnormal ECG, Arrythmias:Atrial Fibrillation,                 Signs/Symptoms:Bacteremia, Chest Pain, Shortness of Breath and                 Dyspnea; Risk Factors:Sleep Apnea and Hypertension. Pulmonary                 embolus.  Sonographer:    Ellouise Mose RDCS Referring Phys: [phone] KEVIN STEINL  Sonographer  Comments: Patient is obese and Technically difficult study due to poor echo windows. Image acquisition challenging due to patient body habitus. Critical care team evaluating patient during exam, patient conversing with them. IMPRESSIONS  1. Left ventricular ejection fraction, by estimation, is 45 to 50%. The left ventricle has mildly decreased function. The left ventricle has no regional wall motion abnormalities. Left ventricular diastolic parameters are indeterminate.  2. Right ventricular systolic function is mildly reduced. The right ventricular size is normal. There is normal pulmonary artery systolic pressure. The estimated right ventricular systolic pressure is 25.3 mmHg.  3. Pericardial effusion measures 2.19cm at short axis pap level and .77cm around the RV. Primarily posterior. No RA or RV collapse. Large pericardial effusion. There is no evidence of cardiac tamponade.  4. The mitral valve is normal in structure. Trivial mitral valve regurgitation. No evidence of mitral stenosis.  5. The aortic valve is tricuspid. Aortic valve regurgitation is not visualized. No aortic stenosis is present.  6. The inferior vena cava is dilated in size with <50% respiratory variability, suggesting right atrial pressure of 15 mmHg. FINDINGS  Left Ventricle: Left ventricular ejection fraction, by estimation, is 45 to 50%. The left ventricle has mildly decreased function. The left ventricle has no regional wall motion abnormalities. The left ventricular internal cavity size was normal in size. There is no left ventricular hypertrophy. Left ventricular diastolic parameters are indeterminate. Right Ventricle: The right ventricular size is normal. No increase in right ventricular wall thickness. Right ventricular systolic function is mildly reduced. There is normal pulmonary artery systolic pressure. The tricuspid regurgitant velocity is 2.36 m/s, and with an assumed right atrial pressure of 3 mmHg, the estimated right  ventricular systolic pressure is 25.3 mmHg. Left Atrium: Left atrial size was normal in size. Right Atrium: Right atrial size was normal in size. Pericardium: Pericardial effusion measures 2.19cm at short axis pap level and .77cm around the RV. Primarily posterior. No RA or RV collapse. A large pericardial effusion is present. There is no evidence of cardiac tamponade. Presence of epicardial fat layer. Mitral Valve: The mitral valve is normal in structure. Trivial mitral valve regurgitation. No evidence of mitral valve stenosis. Tricuspid Valve: The tricuspid valve is normal in structure. Tricuspid valve regurgitation is mild . No evidence of tricuspid stenosis. Aortic Valve: The aortic valve is tricuspid. Aortic valve regurgitation is not visualized. No aortic stenosis is present. Pulmonic Valve: The pulmonic valve was normal in structure. Pulmonic valve regurgitation is not visualized. No evidence of pulmonic stenosis. Aorta: The aortic root and ascending aorta are structurally normal, with no evidence of dilitation. Venous: The inferior vena cava is dilated in size with less than 50% respiratory variability, suggesting right atrial pressure of 15 mmHg. IAS/Shunts: No atrial level shunt detected by color flow Doppler.  LEFT VENTRICLE PLAX 2D LVIDd:         3.80 cm LVIDs:         3.10 cm LV PW:         1.70 cm LV IVS:        1.00 cm LVOT diam:     2.10 cm LV SV:         61 LV SV Index:   29 LVOT Area:     3.46 cm  LV Volumes (MOD) LV vol d, MOD A4C: 80.1 ml LV vol s, MOD A4C: 39.3 ml LV SV MOD A4C:     80.1 ml RIGHT VENTRICLE RV S prime:     10.10 cm/s TAPSE (M-mode): 1.3 cm LEFT ATRIUM           Index        RIGHT ATRIUM           Index LA diam:      3.50 cm 1.65 cm/m   RA Area:     19.60 cm LA Vol (A2C): 22.2 ml 10.48 ml/m  RA Volume:   49.60 ml  23.42 ml/m LA Vol (A4C): 20.0 ml 9.44 ml/m  AORTIC VALVE LVOT Vmax:   106.10 cm/s LVOT Vmean:  66.300 cm/s LVOT VTI:    0.178 m  AORTA Ao Root diam: 3.20 cm Ao Asc  diam:  3.40 cm MITRAL VALVE               TRICUSPID VALVE MV Area (PHT): 4.31 cm    TR Peak grad:   22.3 mmHg MV Decel Time: 176 msec    TR Vmax:        236.00 cm/s MV E velocity: 84.50 cm/s                            SHUNTS                            Systemic VTI:  0.18 m                            Systemic Diam: 2.10 cm Morene Brownie Electronically signed by Morene Brownie Signature Date/Time: 06/10/2024/2:04:39 PM    Final    CT CHEST ABDOMEN PELVIS WO CONTRAST Result Date: 06/10/2024 CLINICAL DATA:  Generalized weakness for 2 weeks.  Sepsis. EXAM: CT CHEST, ABDOMEN AND PELVIS WITHOUT CONTRAST TECHNIQUE: Multidetector CT imaging of the chest, abdomen and pelvis was performed following the standard protocol without IV contrast. RADIATION DOSE REDUCTION: This exam was performed according to the departmental dose-optimization program which includes automated exposure control, adjustment of the mA and/or kV according to patient size and/or use of iterative reconstruction technique. COMPARISON:  May 12, 2024 FINDINGS: CT CHEST FINDINGS Cardiovascular: Aortic atherosclerosis. No evidence of thoracic aortic aneurysm. Mild cardiomegaly. Coronary artery calcifications are noted. Moderate pericardial effusion or hemopericardium is now noted. Mediastinum/Nodes: Stable left thyroid  nodule which has been previously assessed on ultrasound. Esophagus is unremarkable. Calcified mediastinal lymph nodes are noted suggesting prior granulomatous disease. Lungs/Pleura: No pneumothorax or pleural effusion is noted. Left lingular and left lower lobe airspace opacities are noted with bronchograms concerning for possible pneumonia. Musculoskeletal: No chest wall mass or suspicious bone lesions identified. CT ABDOMEN PELVIS FINDINGS Hepatobiliary: No focal liver abnormality is seen. Status post cholecystectomy. No biliary dilatation. Pancreas: Unremarkable. No pancreatic ductal dilatation or  surrounding inflammatory changes.  Spleen: Normal in size without focal abnormality. Adrenals/Urinary Tract: Adrenal glands are unremarkable. Kidneys are normal, without renal calculi, focal lesion, or hydronephrosis. Bladder is unremarkable. Stomach/Bowel: Stomach is unremarkable. Status post appendectomy. Sigmoid diverticulosis without inflammation. No evidence of bowel obstruction. Vascular/Lymphatic: Aortic atherosclerosis. Bilateral inguinal adenopathy is noted. Left inguinal lymph node had been recently biopsied and correlation with pathology results is recommended. Reproductive: Status post hysterectomy. No adnexal masses. Other: Moderate size fat containing periumbilical hernia. No ascites. Musculoskeletal: No acute or significant osseous findings. IMPRESSION: 1. Moderate pericardial effusion or hemopericardium is now noted. 2. Left lingular and left lower lobe airspace opacities are noted with bronchograms concerning for pneumonia. 3. Bilateral inguinal adenopathy is noted. Left inguinal lymph node had been recently biopsied and correlation with pathology results is recommended. 4. Sigmoid diverticulosis without inflammation. 5. Moderate size fat containing periumbilical hernia. 6. Aortic atherosclerosis. Aortic Atherosclerosis (ICD10-I70.0). Electronically Signed   By: Lynwood Landy Raddle M.D.   On: 06/10/2024 11:54   DG Chest Port 1 View Result Date: 06/10/2024 EXAM: 1 VIEW(S) XRAY OF THE CHEST 06/10/2024 07:50:00 AM COMPARISON: 05/12/2024 CLINICAL HISTORY: weakness FINDINGS: LUNGS AND PLEURA: Calcified granuloma of right costophrenic angle. Small to moderate left sided pleural effusion. Increasing left basilar opacities which may reflect atelectasis versus infection. Prominent central pulmonary vasculature without pulmonary edema. No pneumothorax. HEART AND MEDIASTINUM: Cardiomegaly. Atherosclerotic plaque. BONES AND SOFT TISSUES: No acute osseous abnormality. IMPRESSION: 1. Small to moderate left pleural effusion with increasing left  basilar opacities, which may reflect atelectasis or infection. 2. Cardiomegaly. Electronically signed by: Donnice Mania MD 06/10/2024 08:56 AM EST RP Workstation: [id number]   (Echo, Carotid, EGD, Colonoscopy, ERCP)    Subjective:  She does report some phlegm using her incentive spirometer, I have encouraged her to keep using further and we will provide her with flutter valve as well Discharge Exam: Vitals:   07/01/24 0416 07/01/24 0819  BP: 134/78 110/60  Pulse: 93 (!) 102  Resp: 20 19  Temp: (!) 97.5 F (36.4 C) (!) 97.4 F (36.3 C)  SpO2: 94% 94%   Vitals:   06/30/24 2023 07/01/24 0019 07/01/24 0416 07/01/24 0819  BP: 127/70 134/76 134/78 110/60  Pulse: 90 94 93 (!) 102  Resp: 20 (!) 22 20 19   Temp: 97.7 F (36.5 C) 97.7 F (36.5 C) (!) 97.5 F (36.4 C) (!) 97.4 F (36.3 C)  TempSrc: Oral Oral Oral Oral  SpO2: 94% 96% 94% 94%  Weight:      Height:        General: Pt is alert, awake, not in acute distress Cardiovascular: Regular S1/S2 +, no rubs, no gallops Respiratory: CTA bilaterally, no wheezing, no rhonchi Abdominal: Soft, NT, ND, bowel sounds + Extremities: no edema, no cyanosis    The results of significant diagnostics from this hospitalization (including imaging, microbiology, ancillary and laboratory) are listed below for reference.     Microbiology: Recent Results (from the past 240 hours)  Resp panel by RT-PCR (RSV, Flu A&B, Covid) Anterior Nasal Swab     Status: None   Collection Time: 06/21/24 12:26 PM   Specimen: Anterior Nasal Swab  Result Value Ref Range Status   SARS Coronavirus 2 by RT PCR NEGATIVE NEGATIVE Final   Influenza A by PCR NEGATIVE NEGATIVE Final   Influenza B by PCR NEGATIVE NEGATIVE Final    Comment: (NOTE) The Xpert Xpress SARS-CoV-2/FLU/RSV plus assay is intended as an aid in the diagnosis of influenza from Nasopharyngeal swab specimens  and should not be used as a sole basis for treatment. Nasal washings and aspirates are  unacceptable for Xpert Xpress SARS-CoV-2/FLU/RSV testing.  Fact Sheet for Patients: bloggercourse.com  Fact Sheet for Healthcare Providers: seriousbroker.it  This test is not yet approved or cleared by the [place]  FDA and has been authorized for detection and/or diagnosis of SARS-CoV-2 by FDA under an Emergency Use Authorization (EUA). This EUA will remain in effect (meaning this test can be used) for the duration of the COVID-19 declaration under Section 564(b)(1) of the Act, 21 U.S.C. section 360bbb-3(b)(1), unless the authorization is terminated or revoked.     Resp Syncytial Virus by PCR NEGATIVE NEGATIVE Final    Comment: (NOTE) Fact Sheet for Patients: bloggercourse.com  Fact Sheet for Healthcare Providers: seriousbroker.it  This test is not yet approved or cleared by the [place]  FDA and has been authorized for detection and/or diagnosis of SARS-CoV-2 by FDA under an Emergency Use Authorization (EUA). This EUA will remain in effect (meaning this test can be used) for the duration of the COVID-19 declaration under Section 564(b)(1) of the Act, 21 U.S.C. section 360bbb-3(b)(1), unless the authorization is terminated or revoked.  Performed at [facility], [address] [place], [place] [address]   Body fluid culture w Gram Stain     Status: None   Collection Time: 06/25/24 11:45 AM   Specimen: Pleural Fluid  Result Value Ref Range Status   Specimen Description PLEURAL  Final   Special Requests PLEURAL,LEFT  Final   Gram Stain   Final    RARE WBC PRESENT, PREDOMINANTLY MONONUCLEAR NO ORGANISMS SEEN    Culture   Final    NO GROWTH 3 DAYS Performed at [facility], [address] [place], [place] [address]    Report Status 06/28/2024 FINAL  Final  Gram stain     Status: None   Collection Time: 06/30/24  9:14 AM   Specimen: Lung, Left;  Pleural Fluid  Result Value Ref Range Status   Specimen Description PLEURAL  Final   Special Requests LEFT LUNG  Final   Gram Stain   Final    RARE WBC SEEN NO ORGANISMS SEEN Performed at [facility], [address] [place], [place] [address]    Report Status 06/30/2024 FINAL  Final     Labs: BNP (last 3 results) No results for input(s): BNP in the last 8760 hours. Basic Metabolic Panel: Recent Labs  Lab 06/25/24 0436 06/26/24 0545 06/27/24 0255 06/28/24 1441 06/29/24 0344 06/30/24 0319 07/01/24 0554  NA 141 137 138 136 136 139 136  K 3.5 3.3* 4.3 4.7 4.0 3.9 3.7  CL 93* 91* 91* 93* 92* 93* 92*  CO2 41* 37* 41* 33* 39* 39* 35*  GLUCOSE 88 98 91 122* 96 94 98  BUN 19 16 13 15 16 16 20   CREATININE 0.68 0.68 0.66 0.96 0.91 0.91 1.04*  CALCIUM  8.6* 8.8* 9.2 9.2 8.9 9.1 9.1  MG 2.1 1.8 1.8  --  1.9  --   --   PHOS 2.6  --   --   --  3.5  --   --    Liver Function Tests: Recent Labs  Lab 06/25/24 1239  PROT 6.4*  ALBUMIN 2.7*   No results for input(s): LIPASE, AMYLASE in the last 168 hours. No results for input(s): AMMONIA in the last 168 hours. CBC: Recent Labs  Lab 06/25/24 0436 06/26/24 0545 06/27/24 0255 06/28/24 0325 06/29/24 0344  06/30/24 0319  WBC 7.4 6.5 7.5 6.1 6.0 6.1  NEUTROABS 3.6  --   --   --   --   --   HGB 9.2* 9.2* 9.7* 9.9* 10.0* 10.9*  HCT 28.6* 27.7* 30.0* 31.1* 32.1* 34.4*  MCV 91.7 91.4 92.0 92.3 93.0 92.5  PLT 103* 109* 159 212 254 319   Cardiac Enzymes: No results for input(s): CKTOTAL, CKMB, CKMBINDEX, TROPONINI in the last 168 hours. BNP: Invalid input(s): POCBNP CBG: Recent Labs  Lab 06/27/24 0010 06/27/24 0355 06/27/24 0739 06/27/24 1107 06/27/24 1606  GLUCAP 117* 88 91 120* 115*   D-Dimer No results for input(s): DDIMER in the last 72 hours. Hgb A1c No results for input(s): HGBA1C in the last 72 hours. Lipid Profile No results for input(s): CHOL, HDL, LDLCALC, TRIG, CHOLHDL,  LDLDIRECT in the last 72 hours. Thyroid  function studies No results for input(s): TSH, T4TOTAL, T3FREE, THYROIDAB in the last 72 hours.  Invalid input(s): FREET3 Anemia work up No results for input(s): VITAMINB12, FOLATE, FERRITIN, TIBC, IRON, RETICCTPCT in the last 72 hours. Urinalysis    Component Value Date/Time   COLORURINE YELLOW 06/10/2024 1450   APPEARANCEUR HAZY (A) 06/10/2024 1450   LABSPEC 1.026 06/10/2024 1450   PHURINE 5.0 06/10/2024 1450   GLUCOSEU NEGATIVE 06/10/2024 1450   HGBUR NEGATIVE 06/10/2024 1450   BILIRUBINUR NEGATIVE 06/10/2024 1450   KETONESUR NEGATIVE 06/10/2024 1450   PROTEINUR 30 (A) 06/10/2024 1450   UROBILINOGEN 0.2 06/02/2007 1024   NITRITE NEGATIVE 06/10/2024 1450   LEUKOCYTESUR SMALL (A) 06/10/2024 1450   Sepsis Labs Recent Labs  Lab 06/27/24 0255 06/28/24 0325 06/29/24 0344 06/30/24 0319  WBC 7.5 6.1 6.0 6.1   Microbiology Recent Results (from the past 240 hours)  Resp panel by RT-PCR (RSV, Flu A&B, Covid) Anterior Nasal Swab     Status: None   Collection Time: 06/21/24 12:26 PM   Specimen: Anterior Nasal Swab  Result Value Ref Range Status   SARS Coronavirus 2 by RT PCR NEGATIVE NEGATIVE Final   Influenza A by PCR NEGATIVE NEGATIVE Final   Influenza B by PCR NEGATIVE NEGATIVE Final    Comment: (NOTE) The Xpert Xpress SARS-CoV-2/FLU/RSV plus assay is intended as an aid in the diagnosis of influenza from Nasopharyngeal swab specimens and should not be used as a sole basis for treatment. Nasal washings and aspirates are unacceptable for Xpert Xpress SARS-CoV-2/FLU/RSV testing.  Fact Sheet for Patients: bloggercourse.com  Fact Sheet for Healthcare Providers: seriousbroker.it  This test is not yet approved or cleared by the [place]  FDA and has been authorized for detection and/or diagnosis of SARS-CoV-2 by FDA under an Emergency Use Authorization (EUA).  This EUA will remain in effect (meaning this test can be used) for the duration of the COVID-19 declaration under Section 564(b)(1) of the Act, 21 U.S.C. section 360bbb-3(b)(1), unless the authorization is terminated or revoked.     Resp Syncytial Virus by PCR NEGATIVE NEGATIVE Final    Comment: (NOTE) Fact Sheet for Patients: bloggercourse.com  Fact Sheet for Healthcare Providers: seriousbroker.it  This test is not yet approved or cleared by the [place]  FDA and has been authorized for detection and/or diagnosis of SARS-CoV-2 by FDA under an Emergency Use Authorization (EUA). This EUA will remain in effect (meaning this test can be used) for the duration of the COVID-19 declaration under Section 564(b)(1) of the Act, 21 U.S.C. section 360bbb-3(b)(1), unless the authorization is terminated or revoked.  Performed at [facility], [address], [place], [place] [address]   Body fluid culture w Gram Stain     Status: None   Collection Time: 06/25/24 11:45 AM   Specimen: Pleural Fluid  Result Value Ref Range Status   Specimen Description PLEURAL  Final   Special Requests PLEURAL,LEFT  Final   Gram Stain   Final    RARE WBC PRESENT, PREDOMINANTLY MONONUCLEAR NO ORGANISMS SEEN    Culture   Final    NO GROWTH 3 DAYS Performed at [facility], [address], [place], [place] [address]    Report Status 06/28/2024 FINAL  Final  Gram stain     Status: None   Collection Time: 06/30/24  9:14 AM   Specimen: Lung, Left; Pleural Fluid  Result Value Ref Range Status   Specimen Description PLEURAL  Final   Special Requests LEFT LUNG  Final   Gram Stain   Final    RARE WBC SEEN NO ORGANISMS SEEN Performed at [facility], [address], [place], [place] [address]    Report Status 06/30/2024 FINAL  Final     Time coordinating discharge: Over 30 minutes  SIGNED:   Brayton Lye, MD  [facility] 07/01/2024, 12:09 PM Pager   If 7PM-7AM, please contact night-coverage [email or web address]     [1]  Allergies Allergen Reactions   Atenolol Other (See Comments)    Depression    Citalopram Hydrobromide Nausea Only and Other (See Comments)    Nausea/Fatigue   Clindamycin /Lincomycin Other (See Comments)    Via IV, has caused a metallic taste in the mouth    Codeine Other (See Comments)    Caused Hyperactivity and Dysphoria   Coenzyme Q10 Itching   Erythromycin Itching, Anxiety and Other (See Comments)    Also with jitters   Motrin  [Ibuprofen ] Other (See Comments)    Is to not take this   Statins Other (See Comments)    Leg muscle weakness w/ rosuvastatin and simvastatin    Latex Rash   Losartan Potassium Other (See Comments)    Muscle aches

## 2024-07-01 NOTE — Plan of Care (Signed)
" °  Problem: Skin Integrity: Goal: Risk for impaired skin integrity will decrease Outcome: Progressing   Problem: Clinical Measurements: Goal: Ability to maintain clinical measurements within normal limits will improve Outcome: Progressing Goal: Will remain free from infection Outcome: Progressing Goal: Respiratory complications will improve Outcome: Progressing   Problem: Activity: Goal: Risk for activity intolerance will decrease Outcome: Progressing   Problem: Pain Managment: Goal: General experience of comfort will improve and/or be controlled Outcome: Progressing   Problem: Safety: Goal: Ability to remain free from injury will improve Outcome: Progressing   Problem: Skin Integrity: Goal: Risk for impaired skin integrity will decrease Outcome: Progressing   "

## 2024-07-01 NOTE — TOC Progression Note (Addendum)
 Transition of Care [facility]) - Progression Note    Patient Details  Name: [name] MRN: [id number] Date of Birth: 1941-01-12  Transition of Care [facility]) CM/SW Contact  Inocente GORMAN Kindle, LCSW Phone Number: 07/01/2024, 12:27 PM  Clinical Narrative:    CSW met with patient and spouse at bedside. They request [facility] for transport. Patient shared she has her own private out of pocket O2 tank at home for nighttime and a cpap through [employer]. CSW answered questions about potential need for [facility] and O2 after SNF. [facility] able to accept patient today after echo.   Expected Discharge Plan: Skilled Nursing Facility Barriers to Discharge: Continued Medical Work up               Expected Discharge Plan and Services In-house Referral: Clinical Social Work   Post Acute Care Choice: Skilled Nursing Facility Living arrangements for the past 2 months: Single Family Home Expected Discharge Date: 07/01/24                                     Social Drivers of Health (SDOH) Interventions SDOH Screenings   Food Insecurity: No Food Insecurity (06/22/2024)  Housing: Low Risk (06/22/2024)  Transportation Needs: No Transportation Needs (06/22/2024)  Utilities: Not At Risk (06/22/2024)  Depression (PHQ2-9): Low Risk (05/27/2024)  Social Connections: Moderately Integrated (06/22/2024)  Tobacco Use: Low Risk (06/21/2024)    Readmission Risk Interventions    06/13/2024   11:12 AM 09/19/2022    2:13 PM  Readmission Risk Prevention Plan  Post Dischage Appt  Complete  Medication Screening  Complete  Transportation Screening Complete   PCP or Specialist Appt within 3-5 Days Complete   HRI or Home Care Consult Complete   Social Work Consult for Recovery Care Planning/Counseling Complete   Palliative Care Screening Not Applicable   Medication Review Oceanographer) Complete

## 2024-07-03 LAB — PROTEIN, BODY FLUID (OTHER): Total Protein, Body Fluid Other: 4.1 g/dL

## 2024-07-03 LAB — LD, BODY FLUID (OTHER): LD, Body Fluid: 266 [IU]/L

## 2024-07-04 LAB — PATHOLOGIST SMEAR REVIEW

## 2024-07-19 ENCOUNTER — Ambulatory Visit: Admitting: Student in an Organized Health Care Education/Training Program

## 2024-08-23 ENCOUNTER — Ambulatory Visit: Admitting: Adult Health
